# Patient Record
Sex: Female | Born: 1945 | Race: White | Hispanic: No | Marital: Single | Smoking: Former smoker
Health system: Southern US, Community
[De-identification: ages and names within clinical notes are randomized; demographics above are authoritative.]

## PROBLEM LIST (undated history)

## (undated) DIAGNOSIS — R21 Rash and other nonspecific skin eruption: Secondary | ICD-10-CM

## (undated) DIAGNOSIS — M4316 Spondylolisthesis, lumbar region: Secondary | ICD-10-CM

## (undated) DIAGNOSIS — G8929 Other chronic pain: Secondary | ICD-10-CM

## (undated) DIAGNOSIS — G2581 Restless legs syndrome: Secondary | ICD-10-CM

## (undated) DIAGNOSIS — R51 Headache: Secondary | ICD-10-CM

## (undated) DIAGNOSIS — Z8659 Personal history of other mental and behavioral disorders: Secondary | ICD-10-CM

## (undated) DIAGNOSIS — T4145XA Adverse effect of unspecified anesthetic, initial encounter: Secondary | ICD-10-CM

## (undated) DIAGNOSIS — G629 Polyneuropathy, unspecified: Secondary | ICD-10-CM

## (undated) DIAGNOSIS — M545 Low back pain, unspecified: Secondary | ICD-10-CM

## (undated) DIAGNOSIS — T8859XA Other complications of anesthesia, initial encounter: Secondary | ICD-10-CM

## (undated) DIAGNOSIS — R350 Frequency of micturition: Secondary | ICD-10-CM

## (undated) DIAGNOSIS — H353210 Exudative age-related macular degeneration, right eye, stage unspecified: Secondary | ICD-10-CM

## (undated) DIAGNOSIS — M542 Cervicalgia: Secondary | ICD-10-CM

## (undated) DIAGNOSIS — R3915 Urgency of urination: Secondary | ICD-10-CM

## (undated) DIAGNOSIS — E785 Hyperlipidemia, unspecified: Secondary | ICD-10-CM

## (undated) DIAGNOSIS — Z860100 Personal history of colon polyps, unspecified: Secondary | ICD-10-CM

## (undated) DIAGNOSIS — K5909 Other constipation: Secondary | ICD-10-CM

## (undated) DIAGNOSIS — E890 Postprocedural hypothyroidism: Secondary | ICD-10-CM

## (undated) DIAGNOSIS — D509 Iron deficiency anemia, unspecified: Secondary | ICD-10-CM

## (undated) DIAGNOSIS — F419 Anxiety disorder, unspecified: Secondary | ICD-10-CM

## (undated) DIAGNOSIS — I10 Essential (primary) hypertension: Secondary | ICD-10-CM

## (undated) DIAGNOSIS — E119 Type 2 diabetes mellitus without complications: Secondary | ICD-10-CM

## (undated) DIAGNOSIS — R0600 Dyspnea, unspecified: Secondary | ICD-10-CM

## (undated) DIAGNOSIS — F32A Depression, unspecified: Secondary | ICD-10-CM

## (undated) DIAGNOSIS — M1990 Unspecified osteoarthritis, unspecified site: Secondary | ICD-10-CM

## (undated) DIAGNOSIS — R202 Paresthesia of skin: Secondary | ICD-10-CM

## (undated) DIAGNOSIS — G4733 Obstructive sleep apnea (adult) (pediatric): Secondary | ICD-10-CM

## (undated) DIAGNOSIS — F329 Major depressive disorder, single episode, unspecified: Secondary | ICD-10-CM

## (undated) DIAGNOSIS — G4700 Insomnia, unspecified: Secondary | ICD-10-CM

## (undated) DIAGNOSIS — K429 Umbilical hernia without obstruction or gangrene: Secondary | ICD-10-CM

## (undated) DIAGNOSIS — Z8639 Personal history of other endocrine, nutritional and metabolic disease: Secondary | ICD-10-CM

## (undated) DIAGNOSIS — Z85828 Personal history of other malignant neoplasm of skin: Secondary | ICD-10-CM

## (undated) DIAGNOSIS — Z9889 Other specified postprocedural states: Secondary | ICD-10-CM

## (undated) DIAGNOSIS — K219 Gastro-esophageal reflux disease without esophagitis: Secondary | ICD-10-CM

## (undated) DIAGNOSIS — R519 Headache, unspecified: Secondary | ICD-10-CM

## (undated) DIAGNOSIS — R0609 Other forms of dyspnea: Secondary | ICD-10-CM

## (undated) HISTORY — DX: Gastro-esophageal reflux disease without esophagitis: K21.9

## (undated) HISTORY — DX: Obstructive sleep apnea (adult) (pediatric): G47.33

## (undated) HISTORY — DX: Spondylolisthesis, lumbar region: M43.16

---

## 1989-02-22 HISTORY — PX: TUBAL LIGATION: SHX77

## 2003-09-19 ENCOUNTER — Other Ambulatory Visit: Admission: RE | Admit: 2003-09-19 | Discharge: 2003-09-19 | Payer: Self-pay | Admitting: Obstetrics and Gynecology

## 2004-09-24 ENCOUNTER — Other Ambulatory Visit: Admission: RE | Admit: 2004-09-24 | Discharge: 2004-09-24 | Payer: Self-pay | Admitting: Obstetrics and Gynecology

## 2005-02-22 HISTORY — PX: ESOPHAGOGASTRODUODENOSCOPY: SHX1529

## 2005-02-22 HISTORY — PX: COLONOSCOPY: SHX174

## 2005-08-17 ENCOUNTER — Encounter: Admission: RE | Admit: 2005-08-17 | Discharge: 2005-08-17 | Payer: Self-pay | Admitting: Family Medicine

## 2005-12-24 ENCOUNTER — Other Ambulatory Visit: Admission: RE | Admit: 2005-12-24 | Discharge: 2005-12-24 | Payer: Self-pay | Admitting: Obstetrics and Gynecology

## 2006-12-27 ENCOUNTER — Other Ambulatory Visit: Admission: RE | Admit: 2006-12-27 | Discharge: 2006-12-27 | Payer: Self-pay | Admitting: Obstetrics and Gynecology

## 2008-03-04 ENCOUNTER — Other Ambulatory Visit: Admission: RE | Admit: 2008-03-04 | Discharge: 2008-03-04 | Payer: Self-pay | Admitting: Obstetrics and Gynecology

## 2008-03-07 ENCOUNTER — Encounter: Admission: RE | Admit: 2008-03-07 | Discharge: 2008-03-07 | Payer: Self-pay | Admitting: Obstetrics and Gynecology

## 2009-02-22 DIAGNOSIS — Z8639 Personal history of other endocrine, nutritional and metabolic disease: Secondary | ICD-10-CM

## 2009-02-22 HISTORY — DX: Personal history of other endocrine, nutritional and metabolic disease: Z86.39

## 2009-06-16 ENCOUNTER — Encounter (HOSPITAL_COMMUNITY): Admission: RE | Admit: 2009-06-16 | Discharge: 2009-08-26 | Payer: Self-pay | Admitting: Internal Medicine

## 2009-07-01 ENCOUNTER — Other Ambulatory Visit: Admission: RE | Admit: 2009-07-01 | Discharge: 2009-07-01 | Payer: Self-pay | Admitting: Obstetrics and Gynecology

## 2009-09-18 ENCOUNTER — Ambulatory Visit: Payer: Self-pay | Admitting: Internal Medicine

## 2009-09-18 DIAGNOSIS — E039 Hypothyroidism, unspecified: Secondary | ICD-10-CM | POA: Insufficient documentation

## 2009-09-18 DIAGNOSIS — I10 Essential (primary) hypertension: Secondary | ICD-10-CM | POA: Insufficient documentation

## 2009-09-18 DIAGNOSIS — F32A Depression, unspecified: Secondary | ICD-10-CM | POA: Insufficient documentation

## 2009-09-18 DIAGNOSIS — D649 Anemia, unspecified: Secondary | ICD-10-CM | POA: Insufficient documentation

## 2009-09-18 DIAGNOSIS — K219 Gastro-esophageal reflux disease without esophagitis: Secondary | ICD-10-CM

## 2009-09-18 DIAGNOSIS — M1712 Unilateral primary osteoarthritis, left knee: Secondary | ICD-10-CM | POA: Insufficient documentation

## 2009-09-18 DIAGNOSIS — F329 Major depressive disorder, single episode, unspecified: Secondary | ICD-10-CM | POA: Insufficient documentation

## 2009-09-18 DIAGNOSIS — E785 Hyperlipidemia, unspecified: Secondary | ICD-10-CM | POA: Insufficient documentation

## 2009-09-18 DIAGNOSIS — E119 Type 2 diabetes mellitus without complications: Secondary | ICD-10-CM | POA: Insufficient documentation

## 2009-09-18 HISTORY — DX: Gastro-esophageal reflux disease without esophagitis: K21.9

## 2009-09-19 LAB — CONVERTED CEMR LAB
Hgb A1c MFr Bld: 7.5 % — ABNORMAL HIGH (ref 4.6–6.5)
TSH: 0.26 microintl units/mL — ABNORMAL LOW (ref 0.35–5.50)

## 2009-09-26 ENCOUNTER — Telehealth: Payer: Self-pay | Admitting: Internal Medicine

## 2009-10-09 ENCOUNTER — Encounter: Payer: Self-pay | Admitting: Internal Medicine

## 2009-10-09 ENCOUNTER — Telehealth: Payer: Self-pay

## 2009-10-23 ENCOUNTER — Ambulatory Visit: Payer: Self-pay | Admitting: Internal Medicine

## 2009-10-23 DIAGNOSIS — J069 Acute upper respiratory infection, unspecified: Secondary | ICD-10-CM | POA: Insufficient documentation

## 2009-10-23 LAB — CONVERTED CEMR LAB: Blood Glucose, Fingerstick: 120

## 2009-11-20 ENCOUNTER — Ambulatory Visit: Payer: Self-pay | Admitting: Internal Medicine

## 2009-11-28 ENCOUNTER — Telehealth: Payer: Self-pay | Admitting: Internal Medicine

## 2010-01-26 ENCOUNTER — Ambulatory Visit: Payer: Self-pay | Admitting: Internal Medicine

## 2010-01-29 LAB — CONVERTED CEMR LAB: TSH: 8.19 microintl units/mL — ABNORMAL HIGH (ref 0.35–5.50)

## 2010-03-11 ENCOUNTER — Encounter: Payer: Self-pay | Admitting: Internal Medicine

## 2010-03-14 ENCOUNTER — Encounter: Payer: Self-pay | Admitting: Obstetrics and Gynecology

## 2010-03-15 ENCOUNTER — Encounter: Payer: Self-pay | Admitting: Internal Medicine

## 2010-03-23 ENCOUNTER — Telehealth: Payer: Self-pay

## 2010-03-23 ENCOUNTER — Encounter: Payer: Self-pay | Admitting: Internal Medicine

## 2010-03-26 NOTE — Medication Information (Signed)
Summary: Prior Authorization Request for Lexapro  Prior Authorization Request for Lexapro   Imported By: Maryln Gottron 10/14/2009 14:26:16  _____________________________________________________________________  External Attachment:    Type:   Image     Comment:   External Document

## 2010-03-26 NOTE — Progress Notes (Signed)
Summary: Medication List and Blood Sugars taken at home  Medication List and Blood Sugars taken at home   Imported By: Maryln Gottron 09/23/2009 10:12:58  _____________________________________________________________________  External Attachment:    Type:   Image     Comment:   External Document

## 2010-03-26 NOTE — Progress Notes (Signed)
Summary: refill amaryl - new pharmacy  Phone Note Call from Patient Call back at Home Phone [phone]   Caller: Patient Call For: Katie Savers  MD Summary of Call: PT NEEDS NEW RX CALL TO NEW [facility] [address] GLIMEPRIDE 4MG  Initial call taken by: Heron Sabins,  November 28, 2009 12:53 PM    Prescriptions: AMARYL 4 MG TABS (GLIMEPIRIDE) 1/2 qam  #90 x 2   Entered by:   Duard Brady LPN   Authorized by:   Katie Savers  MD   Signed by:   Duard Brady LPN on 91/47/8295   Method used:   Electronically to        [id number]       [address]       [place], [place]  [address]       Ph: [phone] or [phone]       Fax: [phone]   RxID:   [id number]  sent to [facility]

## 2010-03-26 NOTE — Progress Notes (Signed)
Summary:  script for generic Anaprox.   Phone Note Call from Patient Call back at [facility] Phone [phone]   Caller: Patient Summary of Call: Pt called and said that she did not rcv a script for Anaprox DS 550mg  tabs (Naproxen Sodium)1 two times a day. Please call in the Generic [facility]  [facility] [address] [phone] Initial call taken by: Lucy Antigua,  September 26, 2009 10:58 AM  Follow-up for Phone Call        #50  RF 4 Follow-up by: Gordy Savers  MD,  September 26, 2009 12:56 PM    Prescriptions: Margart Sickles DS 550 MG TABS (NAPROXEN SODIUM) bid  #50 x 4   Entered by:   Duard Brady LPN   Authorized by:   Gordy Savers  MD   Signed by:   Duard Brady LPN on 70/62/3762   Method used:   Electronically to        [facility] [address] [id number]       [address]       [place], [place]  [address]       Ph: [phone] or [phone]       Fax: [phone]   RxID:   [id number]

## 2010-03-26 NOTE — Letter (Signed)
Summary: Diabetic Eye Exam/[facility]  Diabetic Eye Exam/[facility]   Imported By: Maryln Gottron 03/19/2010 12:16:55  _____________________________________________________________________  External Attachment:    Type:   Image     Comment:   External Document

## 2010-03-26 NOTE — Assessment & Plan Note (Signed)
Summary: 2 month fup//ccm   Vital Signs:  Patient profile:   65 year old female Weight:      205 pounds Temp:     98.1 degrees F oral BP sitting:   112 / 70  (left arm) Cuff size:   regular  Vitals Entered By: Duard Brady LPN (November 20, 2009 3:15 PM) CC: 2 mos rov - doing well     [id number] Is Patient Diabetic? Yes Did you bring your meter with you today? No Flu Vaccine Consent Questions     Do you have a history of severe allergic reactions to this vaccine? no    Any prior history of allergic reactions to egg and/or gelatin? no    Do you have a sensitivity to the preservative Thimersol? no    Do you have a past history of Guillan-Barre Syndrome? no    Do you currently have an acute febrile illness? no    Have you ever had a severe reaction to latex? no    Vaccine information given and explained to patient? yes    Are you currently pregnant? no    Lot Number:[id number]   Exp Date:08/22/2010   Site Given  Left Deltoid IM   CC:  2 mos rov - doing well     fbs156.  History of Present Illness: a 65 year old patient who is seen today for follow-up of her type 2 diabetes.  She is 8.  Some occasional midafternoon.  Mild hypoglycemic episodes, which have been associated with prolonged periods of fasting.  Blood sugars in the morning are often slightly elevated that she usually has a late heavy meal.  Her last hemoglobin A1c7.5.  She is now on post I-131 thyroid replacement medication.  She generally feels well.  She has treated hypertension and dyslipidemia.  She has a history depression and has been on lyric a 75 mg b.i.d.  She feels this medication is not well tolerated and wishes to give herself a trial off  Allergies: 1)  ! * Flonase 2)  ! Codeine  Past History:  Past Medical History: Reviewed history from 09/18/2009 and no changes required. Depression Diabetes mellitus, type II GERD Hypertension Anemia-NOS Hyperlipidemia Hypothyroidism Osteoarthritis exogenous  obesity  Review of Systems       The patient complains of depression.  The patient denies anorexia, fever, weight loss, weight gain, vision loss, decreased hearing, hoarseness, chest pain, syncope, dyspnea on exertion, peripheral edema, prolonged cough, headaches, hemoptysis, abdominal pain, melena, hematochezia, severe indigestion/heartburn, hematuria, incontinence, genital sores, muscle weakness, suspicious skin lesions, transient blindness, difficulty walking, unusual weight change, abnormal bleeding, enlarged lymph nodes, angioedema, and breast masses.    Physical Exam  General:  overweight-appearing.  normal blood pressureoverweight-appearing.   Head:  Normocephalic and atraumatic without obvious abnormalities. No apparent alopecia or balding. Mouth:  Oral mucosa and oropharynx without lesions or exudates.  Teeth in good repair. Neck:  No deformities, masses, or tenderness noted. Lungs:  Normal respiratory effort, chest expands symmetrically. Lungs are clear to auscultation, no crackles or wheezes. Heart:  Normal rate and regular rhythm. S1 and S2 normal without gallop, murmur, click, rub or other extra sounds. Abdomen:  Bowel sounds positive,abdomen soft and non-tender without masses, organomegaly or hernias noted.   Impression & Recommendations:  Problem # 1:  HYPOTHYROIDISM (ICD-244.9)  Her updated medication list for this problem includes:    Levothroid 100 Mcg Tabs (Levothyroxine sodium) ..... Qam  Her updated medication list for this problem includes:    Levothroid  100 Mcg Tabs (Levothyroxine sodium) ..... Qam  Problem # 2:  HYPERTENSION (ICD-401.9)  Her updated medication list for this problem includes:    Tenoretic 50 50-25 Mg Tabs (Atenolol-chlorthalidone) ..... Qam    qam    Furosemide 20 Mg Tabs (Furosemide) ..... Qam  Her updated medication list for this problem includes:    Tenoretic 50 50-25 Mg Tabs (Atenolol-chlorthalidone) ..... Qam    qam    Furosemide 20 Mg  Tabs (Furosemide) ..... Qam  Problem # 3:  DIABETES MELLITUS, TYPE II (ICD-250.00)  Her updated medication list for this problem includes:    Metformin Hcl 1000 Mg Tabs (Metformin hcl) ..... Bid    Amaryl 4 Mg Tabs (Glimepiride) .Marland Kitchen... 1/2 qam the patient will switch to smaller, more frequent meals in an attempt to avoid afternoon hypoglycemia.  Her updated medication list for this problem includes:    Metformin Hcl 1000 Mg Tabs (Metformin hcl) ..... Bid    Amaryl 4 Mg Tabs (Glimepiride) .Marland Kitchen... 1/2 qam  Problem # 4:  DEPRESSION (ICD-311)  Her updated medication list for this problem includes:    Alprazolam 0.5 Mg Tabs (Alprazolam) ..... Once daily as needed    Trazodone Hcl 100 Mg Tabs (Trazodone hcl) .Marland Kitchen... At bedtime    Lexapro 20 Mg Tabs (Escitalopram oxalate) ..... Qam will decrease lyric to 75 mg daily for two weeks and then discontinue  Her updated medication list for this problem includes:    Alprazolam 0.5 Mg Tabs (Alprazolam) ..... Once daily as needed    Trazodone Hcl 100 Mg Tabs (Trazodone hcl) .Marland Kitchen... At bedtime    Lexapro 20 Mg Tabs (Escitalopram oxalate) ..... Qam  Complete Medication List: 1)  Metformin Hcl 1000 Mg Tabs (Metformin hcl) .... Bid 2)  Amaryl 4 Mg Tabs (Glimepiride) .... 1/2 qam 3)  Lyrica 75 Mg Caps (Pregabalin) .... Bid 4)  Anaprox Ds 550 Mg Tabs (Naproxen sodium) .... Bid 5)  Tylenol Arthritis Pain 650 Mg Cr-tabs (Acetaminophen) .... Q8hrs 6)  Alprazolam 0.5 Mg Tabs (Alprazolam) .... Once daily as needed 7)  Ambien 10 Mg Tabs (Zolpidem tartrate) .... Qhs 8)  Trazodone Hcl 100 Mg Tabs (Trazodone hcl) .... At bedtime 9)  Lexapro 20 Mg Tabs (Escitalopram oxalate) .... Qam 10)  Tenoretic 50 50-25 Mg Tabs (Atenolol-chlorthalidone) .... Qam    qam 11)  Furosemide 20 Mg Tabs (Furosemide) .... Qam 12)  Lopid 600 Mg Tabs (Gemfibrozil) .... Two times a day 13)  Calcium 600+d Plus Minerals 600-400 Mg-unit Tabs (Calcium carbonate-vit d-min) .... Two times a  day 14)  Omeprazole 20 Mg Tbec (omeprazole)  15)  Multivitamins Tabs (Multiple vitamin) .... Once daily 16)  Eye Vitamins Tabs (Multiple vitamins-minerals) .... Once daily 17)  Vagifem 10 Mcg Tabs (Estradiol) .Marland Kitchen.. 1 by mouth 2x weekly 18)  Onetouch Ultra Test Strp (Glucose blood) .... Once daily and prn 19)  Onetouch Ultra 2 W/device Kit (Blood glucose monitoring suppl) 20)  Onetouch Ultrasoft Lancets Misc (Lancets) .... Once daily and prn 21)  Levothroid 100 Mcg Tabs (Levothyroxine sodium) .... Qam 22)  [facility] Iron 325 (65 Fe) Mg Tabs (Ferrous sulfate) .... Qd 23)  Potassium Gluconate 595 Mg Tabs (Potassium gluconate) .... Qd 24)  Fluticasone Propionate 50 Mcg/act Susp (Fluticasone propionate) .... Used twice daily  Other Orders: Admin 1st Vaccine ([address]) Flu Vaccine 34yrs + ([address])  Patient Instructions: 1)  Please schedule a follow-up appointment in 2 months. 2)  Limit your Sodium (Salt). 3)  It is important that you exercise  regularly at least 20 minutes 5 times a week. If you develop chest pain, have severe difficulty breathing, or feel very tired , stop exercising immediately and seek medical attention. 4)  You need to lose weight. Consider a lower calorie diet and regular exercise.  5)  Check your blood sugars regularly. If your readings are usually above : or below 70 you should contact our office. 6)  It is important that your Diabetic A1c level is checked every 3 months.

## 2010-03-26 NOTE — Assessment & Plan Note (Signed)
Summary: fatigue/swollen eyelids/njr   Vital Signs:  Patient profile:   65 year old female Weight:      210 pounds Temp:     97.8 degrees F oral BP sitting:   108 / 72  (left arm) Cuff size:   regular  Vitals Entered By: Duard Brady LPN (October 23, 2009 11:13 AM) CC: c/o eyelids swelling and tearing with discharge , fatigue      fbs 113? Is Patient Diabetic? Yes Did you bring your meter with you today? No CBG Result 120   CC:  c/o eyelids swelling and tearing with discharge  and fatigue      fbs 113?Marland Kitchen  History of Present Illness: 65 year old patient seen today for follow-up.  Since her last visit here, she has been evaluating by endocrinology and now is on supplemental  levothyroxin following I-131 in May of this year.  For the past 8 days.  She has had some hoarseness, cough, itching and erythema of the eyes with some upper lid puffiness.  No fever or sputum production.  Denies any wheezing or shortness of breath she does have a history of type 2 diabetes, obesity, and poor eating habits.  Her last hemoglobin A1c was 7.5.  This was discussed  Allergies: 1)  ! * Flonase 2)  ! Codeine  Review of Systems       The patient complains of hoarseness and prolonged cough.  The patient denies fever, weight loss, weight gain, vision loss, decreased hearing, chest pain, syncope, dyspnea on exertion, peripheral edema, headaches, hemoptysis, abdominal pain, melena, hematochezia, severe indigestion/heartburn, hematuria, incontinence, genital sores, muscle weakness, suspicious skin lesions, transient blindness, difficulty walking, depression, unusual weight change, abnormal bleeding, enlarged lymph nodes, angioedema, and breast masses.    Physical Exam  General:  overweight-appearing.  no distress.  Low-normal blood pressureoverweight-appearing.   Head:  Normocephalic and atraumatic without obvious abnormalities. No apparent alopecia or balding. Eyes:  No corneal or conjunctival  inflammation noted. EOMI. Perrla. Funduscopic exam benign, without hemorrhages, exudates or papilledema. Vision grossly normal. Ears:  External ear exam shows no significant lesions or deformities.  Otoscopic examination reveals clear canals, tympanic membranes are intact bilaterally without bulging, retraction, inflammation or discharge. Hearing is grossly normal bilaterally. Mouth:  Oral mucosa and oropharynx without lesions or exudates.  Teeth in good repair. Neck:  No deformities, masses, or tenderness noted. Lungs:  Normal respiratory effort, chest expands symmetrically. Lungs are clear to auscultation, no crackles or wheezes. Heart:  Normal rate and regular rhythm. S1 and S2 normal without gallop, murmur, click, rub or other extra sounds. Abdomen:  Bowel sounds positive,abdomen soft and non-tender without masses, organomegaly or hernias noted.   Impression & Recommendations:  Problem # 1:  HYPOTHYROIDISM (ICD-244.9)  Her updated medication list for this problem includes:    Levothroid 100 Mcg Tabs (Levothyroxine sodium) ..... Qam  Her updated medication list for this problem includes:    Levothroid 100 Mcg Tabs (Levothyroxine sodium) ..... Qam  Problem # 2:  HYPERTENSION (ICD-401.9)  Her updated medication list for this problem includes:    Tenoretic 50 50-25 Mg Tabs (Atenolol-chlorthalidone) ..... Qam    qam    Furosemide 20 Mg Tabs (Furosemide) ..... Qam  Her updated medication list for this problem includes:    Tenoretic 50 50-25 Mg Tabs (Atenolol-chlorthalidone) ..... Qam    qam    Furosemide 20 Mg Tabs (Furosemide) ..... Qam  Problem # 3:  DIABETES MELLITUS, TYPE II (ICD-250.00)  Her updated medication  list for this problem includes:    Metformin Hcl 1000 Mg Tabs (Metformin hcl) ..... Bid    Amaryl 4 Mg Tabs (Glimepiride) .Marland Kitchen... 1/2 qam  Orders: Capillary Blood Glucose/CBG ([address])  Her updated medication list for this problem includes:    Metformin Hcl 1000 Mg Tabs  (Metformin hcl) ..... Bid    Amaryl 4 Mg Tabs (Glimepiride) .Marland Kitchen... 1/2 qam  Problem # 4:  URI (ICD-465.9)  Her updated medication list for this problem includes:    Anaprox Ds 550 Mg Tabs (Naproxen sodium) ..... Bid    Tylenol Arthritis Pain 650 Mg Cr-tabs (Acetaminophen) ..... Q8hrs samples of Clarinex, dispensed  Her updated medication list for this problem includes:    Anaprox Ds 550 Mg Tabs (Naproxen sodium) ..... Bid    Tylenol Arthritis Pain 650 Mg Cr-tabs (Acetaminophen) ..... Q8hrs  Complete Medication List: 1)  Metformin Hcl 1000 Mg Tabs (Metformin hcl) .... Bid 2)  Amaryl 4 Mg Tabs (Glimepiride) .... 1/2 qam 3)  Lyrica 75 Mg Caps (Pregabalin) .... Bid 4)  Anaprox Ds 550 Mg Tabs (Naproxen sodium) .... Bid 5)  Tylenol Arthritis Pain 650 Mg Cr-tabs (Acetaminophen) .... Q8hrs 6)  Alprazolam 0.5 Mg Tabs (Alprazolam) .... Once daily as needed 7)  Ambien 10 Mg Tabs (Zolpidem tartrate) .... Qhs 8)  Trazodone Hcl 100 Mg Tabs (Trazodone hcl) .... At bedtime 9)  Lexapro 20 Mg Tabs (Escitalopram oxalate) .... Qam 10)  Tenoretic 50 50-25 Mg Tabs (Atenolol-chlorthalidone) .... Qam    qam 11)  Furosemide 20 Mg Tabs (Furosemide) .... Qam 12)  Lopid 600 Mg Tabs (Gemfibrozil) .... Two times a day 13)  Calcium 600+d Plus Minerals 600-400 Mg-unit Tabs (Calcium carbonate-vit d-min) .... Two times a day 14)  Omeprazole 20 Mg Tbec (omeprazole)  15)  Multivitamins Tabs (Multiple vitamin) .... Once daily 16)  Eye Vitamins Tabs (Multiple vitamins-minerals) .... Once daily 17)  Vagifem 10 Mcg Tabs (Estradiol) .Marland Kitchen.. 1 by mouth 2x weekly 18)  Onetouch Ultra Test Strp (Glucose blood) .... Once daily and prn 19)  Onetouch Ultra 2 W/device Kit (Blood glucose monitoring suppl) 20)  Onetouch Ultrasoft Lancets Misc (Lancets) .... Once daily and prn 21)  Levothroid 100 Mcg Tabs (Levothyroxine sodium) .... Qam 22)  [facility] Iron 325 (65 Fe) Mg Tabs (Ferrous sulfate) .... Qd 23)  Potassium Gluconate 595 Mg Tabs  (Potassium gluconate) .... Qd 24)  Fluticasone Propionate 50 Mcg/act Susp (Fluticasone propionate) .... Used twice daily  Patient Instructions: 1)  Get plenty of rest, drink lots of clear liquids, and use Tylenol or Ibuprofen for fever and comfort. Return in 7-10 days if you're not better:sooner if you're feeling worse. 2)  CLARINEX ONE DAILY  Prescriptions: FLUTICASONE PROPIONATE 50 MCG/ACT SUSP (FLUTICASONE PROPIONATE) used twice daily  #1 x 4   Entered and Authorized by:   Gordy Savers  MD   Signed by:   Gordy Savers  MD on 10/23/2009   Method used:   [facility] to        [facility]       [address]       [place], [place]  [address]       Ph: [phone] or [phone]       Fax: [phone]   RxID:   [id number]

## 2010-03-26 NOTE — Assessment & Plan Note (Signed)
Summary: new to est-fup on meds//ccm   Vital Signs:  Patient profile:   65 year old female Height:      62 inches Weight:      198 pounds BMI:     36.35 Temp:     98.2 degrees F oral Pulse rate:   60 / minute Pulse rhythm:   regular Resp:     16 per minute BP sitting:   100 / 70  (right arm) Cuff size:   regular  Vitals Entered By: Duard Brady LPN (September 18, 2009 2:50 PM) CC: new to establish - med review     fbs 148 Is Patient Diabetic? Yes Did you bring your meter with you today? No   CC:  new to establish - med review     fbs 148.  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  Her last hemoglobin  A1c was in January and was 6.9.    Allergies (verified): 1)  ! * Flonase 2)  ! Codeine  Past History:  Past Medical History: Depression Diabetes mellitus, type II GERD Hypertension Anemia-NOS Hyperlipidemia Hypothyroidism Osteoarthritis exogenous obesity  Past Surgical History: colonoscopy 2007 status post I-131 May 2011  Family History: Reviewed history and no changes required. father died age 76, complications of diabetes, and senile dementia mother, age 30, with osteoporosis, dementia, heart failure one brother, age 60, with obesity, and hypertension one sister died age 50  MI  positive for colon cancer grandparent uncles with lung cancer  Social History: Reviewed history and no changes required. 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   discontinued tobacco 2007  Review of Systems       The patient complains of depression.  The patient denies anorexia, fever, weight loss, weight gain, vision loss, decreased hearing, hoarseness, chest pain, syncope, dyspnea on exertion, peripheral edema, prolonged cough, headaches, hemoptysis,  abdominal pain, melena, hematochezia, severe indigestion/heartburn, hematuria, incontinence, genital sores, muscle weakness, suspicious skin lesions, transient blindness, difficulty walking, unusual weight change, abnormal bleeding, enlarged lymph nodes, angioedema, and breast masses.    Physical Exam  General:  overweight-appearing.   Head:  Normocephalic and atraumatic without obvious abnormalities. No apparent alopecia or balding. Eyes:  No corneal or conjunctival inflammation noted. EOMI. Perrla. Funduscopic exam benign, without hemorrhages, exudates or papilledema. Vision grossly normal. Ears:  External ear exam shows no significant lesions or deformities.  Otoscopic examination reveals clear canals, tympanic membranes are intact bilaterally without bulging, retraction, inflammation or discharge. Hearing is grossly normal bilaterally. Mouth:  Oral mucosa and oropharynx without lesions or exudates.  Teeth in good repair. Neck:  No deformities, masses, or tenderness noted. Chest Wall:  No deformities, masses, or tenderness noted. Lungs:  Normal respiratory effort, chest expands symmetrically. Lungs are clear to auscultation, no crackles or wheezes. Heart:  Normal rate and regular rhythm. S1 and S2 normal without gallop, murmur, click, rub or other extra sounds. Abdomen:  Bowel sounds positive,abdomen soft and non-tender without masses, organomegaly or hernias noted. Msk:  No deformity or scoliosis noted of thoracic or lumbar spine.   Pulses:  R and L carotid,radial,femoral,dorsalis pedis and posterior tibial pulses are full and equal bilaterally Extremities:  No clubbing, cyanosis, edema, or deformity noted with normal full range of motion of all joints.   Neurologic:  alert & oriented X3, cranial nerves II-XII intact, gait normal, DTRs  symmetrical and normal, and finger-to-nose normal.   Skin:  Intact without suspicious lesions or rashes Cervical Nodes:  No lymphadenopathy noted Axillary  Nodes:  No palpable lymphadenopathy Inguinal Nodes:  No significant adenopathy Psych:  Cognition and judgment appear intact. Alert and cooperative with normal attention span and concentration. No apparent delusions, illusions, hallucinations  Diabetes Management Exam:    Foot Exam (with socks and/or shoes not present):       Sensory-Pinprick/Light touch:          Left medial foot (L-4): normal          Left dorsal foot (L-5): normal          Left lateral foot (S-1): normal          Right medial foot (L-4): normal          Right dorsal foot (L-5): normal          Right lateral foot (S-1): normal       Sensory-Monofilament:          Left foot: normal          Right foot: normal       Inspection:          Left foot: normal          Right foot: normal       Nails:          Left foot: thickened          Right foot: thickened    Foot Exam by Podiatrist:       Date: 09/18/2009       Results: moderate diabetic findings       Done by: BCT    Eye Exam:       Eye Exam done here today          Results: normal   Impression & Recommendations:  Problem # 1:  PREVENTIVE HEALTH CARE (ICD-V70.0)  Complete Medication List: 1)  Metformin Hcl 1000 Mg Tabs (Metformin hcl) .... Bid 2)  Amaryl 4 Mg Tabs (Glimepiride) .... 1/2 qam 3)  Lyrica 75 Mg Caps (Pregabalin) .... Bid 4)  Anaprox Ds 550 Mg Tabs (Naproxen sodium) .... Bid 5)  Tylenol Arthritis Pain 650 Mg Cr-tabs (Acetaminophen) .... Q8hrs 6)  Alprazolam 0.5 Mg Tabs (Alprazolam) .... Once daily as needed 7)  Ambien 10 Mg Tabs (Zolpidem tartrate) .... Qhs 8)  Trazodone Hcl 100 Mg Tabs (Trazodone hcl) .... At bedtime 9)  Lexapro 20 Mg Tabs (Escitalopram oxalate) .... Qam 10)  Tenoretic 50 50-25 Mg Tabs (Atenolol-chlorthalidone) .... Qam    qam 11)  Furosemide 20 Mg Tabs (Furosemide) .... Qam 12)  Lopid 600 Mg Tabs (Gemfibrozil) .... Two times a day 13)  Calcium 600+d Plus Minerals 600-400 Mg-unit Tabs (Calcium carbonate-vit d-min) .... Two  times a day 14)  Omeprazole 20 Mg Tbec (omeprazole)  15)  Multivitamins Tabs (Multiple vitamin) .... Once daily 16)  Eye Vitamins Tabs (Multiple vitamins-minerals) .... Once daily 17)  Iferex 150 150 Mg Caps (Polysaccharide iron complex) .... Qd 18)  Vagifem 10 Mcg Tabs (Estradiol) .Marland Kitchen.. 1 by mouth 2x weekly 19)  Onetouch Ultra Test Strp (Glucose blood) .... Once daily and prn 20)  Onetouch Ultra 2 W/device Kit (Blood glucose monitoring suppl) 21)  Onetouch Ultrasoft Lancets Misc (Lancets) .... Once daily and prn  Other Orders: Venipuncture (10272) TLB-TSH (Thyroid Stimulating Hormone) (84443-TSH) TLB-A1C / Hgb A1C (Glycohemoglobin) (83036-A1C)  Patient Instructions: 1)  Please schedule a follow-up appointment in 2  months. 2)  Limit your Sodium (Salt). 3)  It is important that you exercise regularly at least 20 minutes 5 times a week. If you develop chest pain, have severe difficulty breathing, or feel very tired , stop exercising immediately and seek medical attention. 4)  You need to lose weight. Consider a lower calorie diet and regular exercise.  5)  Take calcium +Vitamin D daily. 6)  Check your blood sugars regularly. If your readings are usually above : or below 70 you should contact our office. 7)  It is important that your Diabetic A1c level is checked every 3 months. 8)  See your eye doctor yearly to check for diabetic eye damage. Prescriptions: ONETOUCH ULTRASOFT LANCETS  MISC (LANCETS) once daily and prn  #90 x 6   Entered and Authorized by:   Gordy Savers  MD   Signed by:   Gordy Savers  MD on 09/18/2009   Method used:   Print then Give to Patient   RxID:   [id number] Letta Pate ULTRA TEST  STRP (GLUCOSE BLOOD) once daily and prn  #90 x 6   Entered and Authorized by:   Gordy Savers  MD   Signed by:   Gordy Savers  MD on 09/18/2009   Method used:   Print then Give to Patient   RxID:   [id number] OMEPRAZOLE 20 MG TBEC (OMEPRAZOLE)    #90 x 6   Entered and Authorized by:   Gordy Savers  MD   Signed by:   Gordy Savers  MD on 09/18/2009   Method used:   Print then Give to Patient   RxID:   [id number] LOPID 600 MG TABS (GEMFIBROZIL) two times a day  #180 x 6   Entered and Authorized by:   Gordy Savers  MD   Signed by:   Gordy Savers  MD on 09/18/2009   Method used:   Print then Give to Patient   RxID:   [id number] FUROSEMIDE 20 MG TABS (FUROSEMIDE) qam  #90 x 6   Entered and Authorized by:   Gordy Savers  MD   Signed by:   Gordy Savers  MD on 09/18/2009   Method used:   Print then Give to Patient   RxID:   [id number] TENORETIC 50 50-25 MG TABS (ATENOLOL-CHLORTHALIDONE) qam    qam  #90 x 6   Entered and Authorized by:   Gordy Savers  MD   Signed by:   Gordy Savers  MD on 09/18/2009   Method used:   Print then Give to Patient   RxID:   [id number] LEXAPRO 20 MG TABS (ESCITALOPRAM OXALATE) qam  #90 x 3   Entered and Authorized by:   Gordy Savers  MD   Signed by:   Gordy Savers  MD on 09/18/2009   Method used:   Print then Give to Patient   RxID:   [id number] TRAZODONE HCL 100 MG TABS (TRAZODONE HCL) at bedtime  #90 x 6   Entered and Authorized by:   Gordy Savers  MD   Signed by:   Gordy Savers  MD on 09/18/2009   Method used:   Print then Give to Patient   RxID:   [id number] AMBIEN 10 MG TABS (ZOLPIDEM TARTRATE) qhs  #50 x 2   Entered and Authorized by:   Gordy Savers  MD   Signed by:   Gordy Savers  MD  on 09/18/2009   Method used:   Print then Give to Patient   RxID:   [id number] ALPRAZOLAM 0.5 MG TABS (ALPRAZOLAM) once daily as needed  #90 x 2   Entered and Authorized by:   Gordy Savers  MD   Signed by:   Gordy Savers  MD on 09/18/2009   Method used:   Print then Give to Patient   RxID:   [id number] LYRICA 75 MG CAPS (PREGABALIN) bid  #180 x 6   Entered  and Authorized by:   Gordy Savers  MD   Signed by:   Gordy Savers  MD on 09/18/2009   Method used:   Print then Give to Patient   RxID:   [id number] AMARYL 4 MG TABS (GLIMEPIRIDE) 1/2 qam  #90 x 6   Entered and Authorized by:   Gordy Savers  MD   Signed by:   Gordy Savers  MD on 09/18/2009   Method used:   Print then Give to Patient   RxID:   [id number] METFORMIN HCL 1000 MG TABS (METFORMIN HCL) bid  #180 x 6   Entered and Authorized by:   Gordy Savers  MD   Signed by:   Gordy Savers  MD on 09/18/2009   Method used:   Print then Give to Patient   RxID:   [id number]

## 2010-03-26 NOTE — Progress Notes (Signed)
Summary: lexapro hx  Phone Note Outgoing Call   Call placed by: Duard Brady LPN,  October 09, 2009 9:54 AM Call placed to: Patient Summary of Call: spoke with pt - trying to get pre auth on lexapro - , new to our practice 08/2009 - pt gives hx 2006 start.   2005 was on effexor and wellbutrin - failure of tx. KIK Initial call taken by: Duard Brady LPN,  October 09, 2009 9:57 AM

## 2010-03-26 NOTE — Assessment & Plan Note (Signed)
Summary: 2 MTH ROV // RS/pt rescd//ccm   Vital Signs:  Patient profile:   65 year old female Weight:      201 pounds Temp:     98.2 degrees F oral BP sitting:   122 / 88  (left arm) Cuff size:   regular  Vitals Entered By: Duard Brady LPN (January 26, 2010 2:05 PM) CC: 2 mos rov - doing ok  Is Patient Diabetic? Yes Did you bring your meter with you today? No CBG Result 74   CC:  2 mos rov - doing ok .  History of Present Illness:  65 year old patient who is seen today for follow-up of her type 2 diabetes.  She is maintained.  The muscularis into control.  No weight loss.  Her last hemoglobin A1c7.5 she has hypothyroidism, status post I-131 treatment in May of this year.  She is on thyroid replacement medication. She has a history of depression and is self-tapering lyric she wishes to substitute citalopram for Lexapro.  She has treated hypertension and dyslipidemia  Allergies: 1)  ! * Flonase 2)  ! Codeine  Past History:  Past Medical History: Depression Diabetes mellitus, type II GERD Hypertension Anemia-NOS Hyperlipidemia Hypothyroidism Osteoarthritis exogenous obesity OSA (failed CPAP)   Past Surgical History: colonoscopy 2007 status post I-131 May 2011 sleep studies x 3  Family History: Reviewed history from 09/18/2009 and no changes required. father died age 42, complications of diabetes, and senile dementia mother, age 67, with osteoporosis, dementia, heart failure one brother, age 45, with obesity, and hypertension one sister died age 11  MI  positive for colon cancer grandparent uncles with lung cancer  Review of Systems       The patient complains of suspicious skin lesions.  The patient denies anorexia, fever, weight loss, weight gain, vision loss, decreased hearing, hoarseness, chest pain, syncope, dyspnea on exertion, peripheral edema, prolonged cough, headaches, hemoptysis, abdominal pain, melena, hematochezia, severe  indigestion/heartburn, hematuria, incontinence, genital sores, muscle weakness, transient blindness, difficulty walking, depression, unusual weight change, abnormal bleeding, enlarged lymph nodes, angioedema, and breast masses.    Physical Exam  General:  overweight-appearing.  normal blood pressureoverweight-appearing.   Head:  Normocephalic and atraumatic without obvious abnormalities. No apparent alopecia or balding. Eyes:  conjunctiva injection Mouth:  pharyngeal crowding and low-lying uvula.   Neck:  No deformities, masses, or tenderness noted. Lungs:  Normal respiratory effort, chest expands symmetrically. Lungs are clear to auscultation, no crackles or wheezes. Heart:  Normal rate and regular rhythm. S1 and S2 normal without gallop, murmur, click, rub or other extra sounds. Abdomen:  Bowel sounds positive,abdomen soft and non-tender without masses, organomegaly or hernias noted. 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 Qam    qam    Furosemide 20 Mg Tabs (Furosemide) ..... Qam  Problem # 3:  DIABETES MELLITUS, TYPE II (ICD-250.00)  Her updated medication list for this problem includes:    Metformin Hcl 1000 Mg Tabs (  Metformin hcl) ..... Bid    Amaryl 4 Mg Tabs (Glimepiride) .Marland Kitchen... 1/2 qam  Orders: Capillary Blood Glucose/CBG (95621) Venipuncture (30865) TLB-A1C / Hgb A1C (Glycohemoglobin) (83036-A1C) Specimen Handling  (99000)  Complete Medication List: 1)  Metformin Hcl 1000 Mg Tabs (Metformin hcl) .... Bid 2)  Amaryl 4 Mg Tabs (Glimepiride) .... 1/2 qam 3)  Lyrica 75 Mg Caps (Pregabalin) .... Qd 4)  Anaprox Ds 550 Mg Tabs (Naproxen sodium) .... Bid 5)  Tylenol Arthritis Pain 650 Mg Cr-tabs (Acetaminophen) .... Q8hrs 6)  Alprazolam 0.5 Mg Tabs (Alprazolam) .... Once daily as needed 7)  Ambien 10 Mg Tabs (Zolpidem tartrate) .... Qhs 8)  Trazodone Hcl 100 Mg Tabs (Trazodone hcl) .... At bedtime 9)  Tenoretic 50 50-25 Mg Tabs (Atenolol-chlorthalidone) .... Qam    qam 10)  Furosemide 20 Mg Tabs (Furosemide) .... Qam 11)  Lopid 600 Mg Tabs (Gemfibrozil) .... Two times a day 12)  Calcium 600+d Plus Minerals 600-400 Mg-unit Tabs (Calcium carbonate-vit d-min) .... Two times a day 13)  Omeprazole 20 Mg Tbec (omeprazole)  14)  Multivitamins Tabs (Multiple vitamin) .... Once daily 15)  Eye Vitamins Tabs (Multiple vitamins-minerals) .... Once daily 16)  Onetouch Ultra Test Strp (Glucose blood) .... Once daily and prn 17)  Onetouch Ultra 2 W/device Kit (Blood glucose monitoring suppl) 18)  Onetouch Ultrasoft Lancets Misc (Lancets) .... Once daily and prn 19)  Levothroid 125 Mcg Tabs (levothyroxine Sodium)  .... Qam 20)  [facility] Iron 325 (65 Fe) Mg Tabs (Ferrous sulfate) .... Qd 21)  Potassium Gluconate 595 Mg Tabs (Potassium gluconate) .... Qd 22)  Fluticasone Propionate 50 Mcg/act Susp (Fluticasone propionate) .... Used twice daily 23)  Citalopram Hydrobromide 40 Mg Tabs (Citalopram hydrobromide) .... One daily  Other Orders: TLB-TSH (Thyroid Stimulating Hormone) [phone])  Patient Instructions: 1)  Please schedule a follow-up appointment in 3 months. 2)  It is important that you exercise regularly at least 20 minutes 5 times a week. If you develop chest pain, have severe difficulty breathing, or feel very tired , stop exercising immediately and seek medical attention. 3)  You need to lose weight. Consider a  lower calorie diet and regular exercise.  4)  Check your blood sugars regularly. If your readings are usually above : or below 70 you should contact our office. 5)  It is important that your Diabetic A1c level is checked every 3 months. 6)  See your eye doctor yearly to check for diabetic eye damage. Prescriptions: CITALOPRAM HYDROBROMIDE 40 MG TABS (CITALOPRAM HYDROBROMIDE) one daily  #90 x 6   Entered and Authorized by:   Gordy Savers  MD   Signed by:   Gordy Savers  MD on 01/26/2010   Method used:   Print then Give to Patient   RxID:   [id number] FLUTICASONE PROPIONATE 50 MCG/ACT SUSP (FLUTICASONE PROPIONATE) used twice daily  #1 x 4   Entered and Authorized by:   Gordy Savers  MD   Signed by:   Gordy Savers  MD on 01/26/2010   Method used:   Print then Give to Patient   RxID:   [id number] LEVOTHROID 125 MCG TABS (LEVOTHYROXINE SODIUM) qAM  #90 x 6   Entered and Authorized by:   Gordy Savers  MD   Signed by:   Gordy Savers  MD on 01/26/2010   Method used:   Print then Give to Patient   RxID:   [id number] ONETOUCH ULTRA TEST  STRP (GLUCOSE BLOOD) once  daily and prn  #90 x 6   Entered and Authorized by:   Gordy Savers  MD   Signed by:   Gordy Savers  MD on 01/26/2010   Method used:   Print then Give to Patient   RxID:   [id number] OMEPRAZOLE 20 MG TBEC (OMEPRAZOLE)   #90 x 6   Entered and Authorized by:   Gordy Savers  MD   Signed by:   Gordy Savers  MD on 01/26/2010   Method used:   Print then Give to Patient   RxID:   [id number] LOPID 600 MG TABS (GEMFIBROZIL) two times a day  #180 x 6   Entered and Authorized by:   Gordy Savers  MD   Signed by:   Gordy Savers  MD on 01/26/2010   Method used:   Print then Give to Patient   RxID:   [id number] FUROSEMIDE 20 MG TABS (FUROSEMIDE) qam  #90 x 6   Entered and Authorized by:   Gordy Savers  MD   Signed by:    Gordy Savers  MD on 01/26/2010   Method used:   Print then Give to Patient   RxID:   [id number] TENORETIC 50 50-25 MG TABS (ATENOLOL-CHLORTHALIDONE) qam    qam  #90 x 6   Entered and Authorized by:   Gordy Savers  MD   Signed by:   Gordy Savers  MD on 01/26/2010   Method used:   Print then Give to Patient   RxID:   [id number] TRAZODONE HCL 100 MG TABS (TRAZODONE HCL) at bedtime  #90 x 6   Entered and Authorized by:   Gordy Savers  MD   Signed by:   Gordy Savers  MD on 01/26/2010   Method used:   Print then Give to Patient   RxID:   [id number] AMBIEN 10 MG TABS (ZOLPIDEM TARTRATE) qhs  #50 x 2   Entered and Authorized by:   Gordy Savers  MD   Signed by:   Gordy Savers  MD on 01/26/2010   Method used:   Print then Give to Patient   RxID:   [id number] ALPRAZOLAM 0.5 MG TABS (ALPRAZOLAM) once daily as needed  #90 x 2   Entered and Authorized by:   Gordy Savers  MD   Signed by:   Gordy Savers  MD on 01/26/2010   Method used:   Print then Give to Patient   RxID:   [id number] LYRICA 75 MG CAPS (PREGABALIN) qd  #90 x 3   Entered and Authorized by:   Gordy Savers  MD   Signed by:   Gordy Savers  MD on 01/26/2010   Method used:   Print then Give to Patient   RxID:   [id number] AMARYL 4 MG TABS (GLIMEPIRIDE) 1/2 qam  #90 x 2   Entered and Authorized by:   Gordy Savers  MD   Signed by:   Gordy Savers  MD on 01/26/2010   Method used:   Print then Give to Patient   RxID:   [id number] METFORMIN HCL 1000 MG TABS (METFORMIN HCL) bid  #180 x 6   Entered and Authorized by:   Gordy Savers  MD   Signed by:   Gordy Savers  MD on 01/26/2010   Method used:   Print then Give to Patient   RxID:   [id number]  Orders Added: 1)  Capillary Blood Glucose/CBG [82948] 2)  Est. Patient Level IV [04540] 3)  Venipuncture [36415] 4)  TLB-TSH (Thyroid  Stimulating Hormone) [84443-TSH] 5)  TLB-A1C / Hgb A1C (Glycohemoglobin) [83036-A1C] 6)  Specimen Handling [99000]

## 2010-03-30 ENCOUNTER — Other Ambulatory Visit: Payer: Self-pay | Admitting: Internal Medicine

## 2010-04-01 NOTE — Miscellaneous (Signed)
Summary: sig for omprazole  Clinical Lists Changes  Medications: Changed medication from * OMEPRAZOLE 20 MG TBEC (OMEPRAZOLE) to * OMEPRAZOLE 20 MG TBEC (OMEPRAZOLE) qd - Signed Rx of OMEPRAZOLE 20 MG TBEC (OMEPRAZOLE) qd;  #90 x 6;  Signed;  Entered by: Duard Brady LPN;  Authorized by: Gordy Savers  MD;  Method used: Faxed to [id number] [address] [place], [place]  [address], Ph: [phone] or [phone], Fax: [phone]    Prescriptions: OMEPRAZOLE 20 MG TBEC (OMEPRAZOLE) qd  #90 x 6   Entered by:   Duard Brady LPN   Authorized by:   Gordy Savers  MD   Signed by:   Duard Brady LPN on 03/47/4259   Method used:   Faxed to ...       [facility] (retail)       [address]       [place], [place]  [address]       Ph: [phone] or [phone]       Fax: [phone]   RxID:   [id number]

## 2010-04-01 NOTE — Progress Notes (Signed)
Summary: refill zolpidem - denied  Phone Note Refill Request Message from:  Fax from Pharmacy on March 23, 2010 12:12 PM  Refills Requested: Medication #1:  AMBIEN 10 MG TABS qhs   Last Refilled: 02/20/[facility] [address]    Method Requested: Fax to Local Pharmacy Initial call taken by: Duard Brady LPN,  March 23, 2010 12:13 PM  Follow-up for Phone Call        denied - was given printed rx 01/26/10 appt with refills and other medications Follow-up by: Duard Brady LPN,  March 23, 2010 12:15 PM

## 2010-04-24 ENCOUNTER — Encounter: Payer: Self-pay | Admitting: Internal Medicine

## 2010-04-27 ENCOUNTER — Ambulatory Visit (INDEPENDENT_AMBULATORY_CARE_PROVIDER_SITE_OTHER)
Admission: RE | Admit: 2010-04-27 | Discharge: 2010-04-27 | Disposition: A | Discharge status: Home / Self Care | Payer: PRIVATE HEALTH INSURANCE | Source: Ambulatory Visit | Attending: Internal Medicine | Admitting: Internal Medicine

## 2010-04-27 ENCOUNTER — Ambulatory Visit (INDEPENDENT_AMBULATORY_CARE_PROVIDER_SITE_OTHER): Payer: PRIVATE HEALTH INSURANCE | Admitting: Internal Medicine

## 2010-04-27 ENCOUNTER — Other Ambulatory Visit: Payer: Self-pay | Admitting: Internal Medicine

## 2010-04-27 ENCOUNTER — Encounter: Payer: Self-pay | Admitting: Internal Medicine

## 2010-04-27 DIAGNOSIS — B399 Histoplasmosis, unspecified: Secondary | ICD-10-CM

## 2010-04-27 DIAGNOSIS — E039 Hypothyroidism, unspecified: Secondary | ICD-10-CM

## 2010-04-27 DIAGNOSIS — E119 Type 2 diabetes mellitus without complications: Secondary | ICD-10-CM

## 2010-04-27 DIAGNOSIS — H32 Chorioretinal disorders in diseases classified elsewhere: Secondary | ICD-10-CM

## 2010-04-27 DIAGNOSIS — R918 Other nonspecific abnormal finding of lung field: Secondary | ICD-10-CM

## 2010-04-27 DIAGNOSIS — F329 Major depressive disorder, single episode, unspecified: Secondary | ICD-10-CM

## 2010-04-27 DIAGNOSIS — I10 Essential (primary) hypertension: Secondary | ICD-10-CM

## 2010-04-27 LAB — HEMOGLOBIN A1C: Hgb A1c MFr Bld: 6.8 % — ABNORMAL HIGH (ref 4.6–6.5)

## 2010-04-27 NOTE — Patient Instructions (Signed)
Limit your sodium (Salt) intake  Please check your blood pressure on a regular basis.  If it is consistently greater than 150/90, please make an office appointment.   Please check your hemoglobin A1c every 3 months    It is important that you exercise regularly, at least 20 minutes 3 to 4 times per week.  If you develop chest pain or shortness of breath seek  medical attention.  You need to lose weight.  Consider a lower calorie diet and regular exercise. 

## 2010-04-27 NOTE — Progress Notes (Signed)
  Subjective:    Patient ID: Katie Booker, female    DOB: 1945-03-10, 65 y.o.   MRN: [id number]  HPI   A 65 year old patient who is seen today for followup of her type 2 diabetes. 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 She denies any pulmonary symptoms. There is no evidence of diabetic neuropathy. Her last hemoglobin A1c 6.5. There's been some modest weight loss since September.\ She has a history of depression and presently is on alprazolam trazodone as well as Celexa. She has had a considerable situational stress and worsening depression due to her present unemployment. She is considering applying for disability due to her depression and multiple medical issues  She has treated hypertension which has been well-controlled . She has hypothyroidism and her levothyroxin dose was recently adjusted.    Review of Systems  Constitutional: Negative.   HENT: Negative for hearing loss, congestion, sore throat, rhinorrhea, dental problem, sinus pressure and tinnitus.   Eyes: Negative for pain, discharge and visual disturbance.  Respiratory: Negative for cough and shortness of breath.   Cardiovascular: Negative for chest pain, palpitations and leg swelling.  Gastrointestinal: Negative for nausea, vomiting, abdominal pain, diarrhea, constipation, blood in stool and abdominal distention.  Genitourinary: Negative for dysuria, urgency, frequency, hematuria, flank pain, vaginal bleeding, vaginal discharge, difficulty urinating, vaginal pain and pelvic pain.  Musculoskeletal: Negative for joint swelling, arthralgias and gait problem.  Skin: Negative for rash.  Neurological: Negative for dizziness, syncope, speech difficulty, weakness, numbness and headaches.  Hematological: Negative for adenopathy.  Psychiatric/Behavioral: Positive for dysphoric mood. Negative for behavioral problems and agitation. The patient is not nervous/anxious.        Objective:     Physical Exam  Constitutional: She is oriented to person, place, and time. She appears well-developed and well-nourished.        obese  HENT:  Head: Normocephalic.  Right Ear: External ear normal.  Left Ear: External ear normal.  Mouth/Throat: Oropharynx is clear and moist.  Eyes: Conjunctivae and EOM are normal. Pupils are equal, round, and reactive to light.  Neck: Normal range of motion. Neck supple. No thyromegaly present.  Cardiovascular: Normal rate, regular rhythm, normal heart sounds and intact distal pulses.   Pulmonary/Chest: Effort normal and breath sounds normal.  Abdominal: Soft. Bowel sounds are normal. She exhibits no mass. There is no tenderness.  Musculoskeletal: Normal range of motion.  Lymphadenopathy:    She has no cervical adenopathy.  Neurological: She is alert and oriented to person, place, and time.  Skin: Skin is warm and dry. No rash noted.  Psychiatric: She has a normal mood and affect. Her behavior is normal.          Assessment & Plan:   type 2 diabetes stable we'll check a hemoglobin A1c today  Hypertension stable\  hypothyroidism. We'll check a followup TSH  History of ocular histoplasmosis. We'll check a chest x-ray  depression- will  Continue  present regimen. We'll consider referral to a behavioral health specialist

## 2010-04-27 NOTE — Progress Notes (Signed)
  Subjective:    Patient ID: Katie Booker, female    DOB: 03/15/1945, 65 y.o.   MRN: [id number]  HPI Wt Readings from Last 3 Encounters:  04/27/10 200 lb (90.719 kg)  01/26/10 201 lb (91.173 kg)  11/20/09 205 lb (92.987 kg)      Review of Systems     Objective:   Physical Exam        Assessment & Plan:

## 2010-04-29 ENCOUNTER — Ambulatory Visit (INDEPENDENT_AMBULATORY_CARE_PROVIDER_SITE_OTHER)
Admission: RE | Admit: 2010-04-29 | Discharge: 2010-04-29 | Disposition: A | Discharge status: Home / Self Care | Payer: PRIVATE HEALTH INSURANCE | Source: Ambulatory Visit | Attending: Internal Medicine | Admitting: Internal Medicine

## 2010-04-29 DIAGNOSIS — R918 Other nonspecific abnormal finding of lung field: Secondary | ICD-10-CM

## 2010-05-01 ENCOUNTER — Other Ambulatory Visit: Payer: Self-pay | Admitting: Internal Medicine

## 2010-05-01 NOTE — Progress Notes (Signed)
Quick Note:  docotr spoke with pt - pet scan ordered. KIK ______

## 2010-05-04 ENCOUNTER — Other Ambulatory Visit: Payer: Self-pay | Admitting: Internal Medicine

## 2010-05-04 DIAGNOSIS — R918 Other nonspecific abnormal finding of lung field: Secondary | ICD-10-CM

## 2010-05-12 ENCOUNTER — Encounter (HOSPITAL_COMMUNITY): Payer: Self-pay

## 2010-05-12 ENCOUNTER — Encounter (HOSPITAL_COMMUNITY)
Admission: RE | Admit: 2010-05-12 | Discharge: 2010-05-12 | Disposition: A | Discharge status: Home / Self Care | Payer: PRIVATE HEALTH INSURANCE | Source: Ambulatory Visit | Attending: Internal Medicine | Admitting: Internal Medicine

## 2010-05-12 DIAGNOSIS — R918 Other nonspecific abnormal finding of lung field: Secondary | ICD-10-CM

## 2010-05-12 MED ORDER — FLUDEOXYGLUCOSE F - 18 (FDG) INJECTION
18.7000 | Freq: Once | INTRAVENOUS | Status: AC | PRN
  Administered 2010-05-12: 18.7 via INTRAVENOUS

## 2010-06-17 ENCOUNTER — Other Ambulatory Visit: Payer: Self-pay | Admitting: Internal Medicine

## 2010-07-26 ENCOUNTER — Other Ambulatory Visit: Payer: Self-pay | Admitting: Internal Medicine

## 2010-07-28 ENCOUNTER — Encounter: Payer: Self-pay | Admitting: Internal Medicine

## 2010-07-28 ENCOUNTER — Ambulatory Visit (INDEPENDENT_AMBULATORY_CARE_PROVIDER_SITE_OTHER): Payer: PRIVATE HEALTH INSURANCE | Admitting: Internal Medicine

## 2010-07-28 DIAGNOSIS — F329 Major depressive disorder, single episode, unspecified: Secondary | ICD-10-CM

## 2010-07-28 DIAGNOSIS — E119 Type 2 diabetes mellitus without complications: Secondary | ICD-10-CM

## 2010-07-28 DIAGNOSIS — I10 Essential (primary) hypertension: Secondary | ICD-10-CM

## 2010-07-28 DIAGNOSIS — E785 Hyperlipidemia, unspecified: Secondary | ICD-10-CM

## 2010-07-28 DIAGNOSIS — M1990 Unspecified osteoarthritis, unspecified site: Secondary | ICD-10-CM

## 2010-07-28 MED ORDER — LEVOTHYROXINE SODIUM 150 MCG PO TABS: 150.0000 ug | ORAL_TABLET | Freq: Every day | ORAL | Status: DC

## 2010-07-28 MED ORDER — PILOCARPINE HCL 5 MG PO TABS: 5.0000 mg | ORAL_TABLET | Freq: Three times a day (TID) | ORAL | Status: DC

## 2010-07-28 MED ORDER — GLIMEPIRIDE 2 MG PO TABS: 2.0000 mg | ORAL_TABLET | Freq: Every day | ORAL | Status: DC

## 2010-07-28 MED ORDER — GEMFIBROZIL 600 MG PO TABS: 600.0000 mg | ORAL_TABLET | Freq: Two times a day (BID) | ORAL | Status: DC

## 2010-07-28 MED ORDER — ALPRAZOLAM 0.5 MG PO TABS: 0.5000 mg | ORAL_TABLET | Freq: Every day | ORAL | Status: DC | PRN

## 2010-07-28 MED ORDER — TRAZODONE HCL 100 MG PO TABS: 100.0000 mg | ORAL_TABLET | Freq: Every day | ORAL | Status: DC

## 2010-07-28 MED ORDER — PREGABALIN 75 MG PO CAPS: 75.0000 mg | ORAL_CAPSULE | Freq: Every day | ORAL | Status: DC

## 2010-07-28 MED ORDER — OMEPRAZOLE 20 MG PO CPDR: 20.0000 mg | DELAYED_RELEASE_CAPSULE | Freq: Every day | ORAL | Status: DC

## 2010-07-28 MED ORDER — FLUTICASONE PROPIONATE 50 MCG/ACT NA SUSP: 1.0000 | Freq: Two times a day (BID) | NASAL | Status: DC

## 2010-07-28 MED ORDER — CITALOPRAM HYDROBROMIDE 40 MG PO TABS: 40.0000 mg | ORAL_TABLET | Freq: Every day | ORAL | Status: DC

## 2010-07-28 MED ORDER — GLIMEPIRIDE 4 MG PO TABS: 2.0000 mg | ORAL_TABLET | Freq: Every day | ORAL | Status: DC

## 2010-07-28 MED ORDER — ZOLPIDEM TARTRATE 10 MG PO TABS: 10.0000 mg | ORAL_TABLET | Freq: Every evening | ORAL | Status: DC | PRN

## 2010-07-28 MED ORDER — FUROSEMIDE 20 MG PO TABS: 20.0000 mg | ORAL_TABLET | Freq: Every day | ORAL | Status: DC

## 2010-07-28 MED ORDER — METFORMIN HCL 1000 MG PO TABS: 1000.0000 mg | ORAL_TABLET | Freq: Two times a day (BID) | ORAL | Status: DC

## 2010-07-28 NOTE — Progress Notes (Signed)
  Subjective:    Patient ID: Binta Statzer, female    DOB: June 05, 1945, 65 y.o.   MRN: [id number]  HPI  65 year old patient who is seen today for followup of her type 2 diabetes. Her fasting blood sugars are generally well controlled. She has frequent mid afternoon low blood sugar readings in the 60s and is mildly symptomatic. Her last hemoglobin A1c was 6.8. Complaints include mild sore throat and sinus congestion that are improving. She does complain of significant dry mouth and is requesting a prescription for pilocarpine recommended by her dentist. She has hypertension and dyslipidemia. She is requesting refills on all her medications. She has hypertension that has been well-controlled. She has a history of depression which has been controlled on Celexa.    Review of Systems  Constitutional: Negative.   HENT: Negative for hearing loss, congestion, sore throat, rhinorrhea, dental problem, sinus pressure and tinnitus.   Eyes: Negative for pain, discharge and visual disturbance.  Respiratory: Negative for cough and shortness of breath.   Cardiovascular: Negative for chest pain, palpitations and leg swelling.  Gastrointestinal: Negative for nausea, vomiting, abdominal pain, diarrhea, constipation, blood in stool and abdominal distention.  Genitourinary: Negative for dysuria, urgency, frequency, hematuria, flank pain, vaginal bleeding, vaginal discharge, difficulty urinating, vaginal pain and pelvic pain.  Musculoskeletal: Negative for joint swelling, arthralgias and gait problem.  Skin: Negative for rash.  Neurological: Negative for dizziness, syncope, speech difficulty, weakness, numbness and headaches.  Hematological: Negative for adenopathy.  Psychiatric/Behavioral: Negative for behavioral problems, dysphoric mood and agitation. The patient is not nervous/anxious.        Objective:   Physical Exam  Constitutional: She is oriented to person, place, and time. She appears well-developed  and well-nourished.  HENT:  Head: Normocephalic.  Right Ear: External ear normal.  Left Ear: External ear normal.  Mouth/Throat: Oropharynx is clear and moist.  Eyes: Conjunctivae and EOM are normal. Pupils are equal, round, and reactive to light.  Neck: Normal range of motion. Neck supple. No thyromegaly present.  Cardiovascular: Normal rate, regular rhythm, normal heart sounds and intact distal pulses.   Pulmonary/Chest: Effort normal and breath sounds normal.  Abdominal: Soft. Bowel sounds are normal. She exhibits no mass. There is no tenderness.  Musculoskeletal: Normal range of motion.  Lymphadenopathy:    She has no cervical adenopathy.  Neurological: She is alert and oriented to person, place, and time.  Skin: Skin is warm and dry. No rash noted.  Psychiatric: She has a normal mood and affect. Her behavior is normal.          Assessment & Plan:   Diabetes mellitus. We'll decrease Amaryl to  1 mg daily. Hemoglobin A1c will be checked Hypertension well controlled. We'll continue her present regimen Depression. Reason is stable all medications refilled Hypothyroidism Osteoarthritis

## 2010-07-28 NOTE — Patient Instructions (Signed)
Limit your sodium (Salt) intake  You need to lose weight.  Consider a lower calorie diet and regular exercise.   Please check your hemoglobin A1c every 3 months    It is important that you exercise regularly, at least 20 minutes 3 to 4 times per week.  If you develop chest pain or shortness of breath seek  medical attention. 

## 2010-07-29 LAB — HEMOGLOBIN A1C: Hgb A1c MFr Bld: 7.3 % — ABNORMAL HIGH (ref 4.6–6.5)

## 2010-08-13 ENCOUNTER — Telehealth: Payer: Self-pay

## 2010-08-14 ENCOUNTER — Ambulatory Visit (INDEPENDENT_AMBULATORY_CARE_PROVIDER_SITE_OTHER): Payer: PRIVATE HEALTH INSURANCE | Admitting: Internal Medicine

## 2010-08-14 ENCOUNTER — Encounter: Payer: Self-pay | Admitting: Internal Medicine

## 2010-08-14 DIAGNOSIS — R21 Rash and other nonspecific skin eruption: Secondary | ICD-10-CM

## 2010-08-14 DIAGNOSIS — E119 Type 2 diabetes mellitus without complications: Secondary | ICD-10-CM

## 2010-08-14 MED ORDER — NYSTATIN-TRIAMCINOLONE 100000-0.1 UNIT/GM-% EX OINT: TOPICAL_OINTMENT | Freq: Two times a day (BID) | CUTANEOUS | Status: AC

## 2010-08-14 NOTE — Patient Instructions (Signed)
Call or return to clinic prn if these symptoms worsen or fail to improve as anticipated.

## 2010-08-14 NOTE — Progress Notes (Signed)
  Subjective:    Patient ID: Katie Booker, female    DOB: 10/19/1945, 65 y.o.   MRN: [id number]  HPI  65 year old patient with history of type 2 diabetes who presents with a several-day history of a perineal rash. She is also noted a flareup of her external hemorrhoids. She's tried a number of topical medications without much benefit.   Review of Systems  Skin: Positive for rash.       Objective:   Physical Exam  Constitutional: She is oriented to person, place, and time. She appears well-developed and well-nourished.  HENT:  Head: Normocephalic.  Right Ear: External ear normal.  Left Ear: External ear normal.  Mouth/Throat: Oropharynx is clear and moist.  Eyes: Conjunctivae and EOM are normal. Pupils are equal, round, and reactive to light.  Neck: Normal range of motion. Neck supple. No thyromegaly present.  Cardiovascular: Normal rate, regular rhythm, normal heart sounds and intact distal pulses.   Pulmonary/Chest: Effort normal and breath sounds normal.  Abdominal: Soft. Bowel sounds are normal. She exhibits no mass. There is no tenderness.  Genitourinary:       External hemorrhoids were noted Significant perineal erythema in the perirectal area and perineum  Musculoskeletal: Normal range of motion.  Lymphadenopathy:    She has no cervical adenopathy.  Neurological: She is alert and oriented to person, place, and time.  Skin: Skin is warm and dry. No rash noted.  Psychiatric: She has a normal mood and affect. Her behavior is normal.          Assessment & Plan:   Perineal dermatitis. Will treat with Gyne-Lotrimin cream. Call if not improved

## 2010-08-14 NOTE — Telephone Encounter (Signed)
error 

## 2010-09-15 ENCOUNTER — Other Ambulatory Visit: Payer: Self-pay | Admitting: Internal Medicine

## 2010-09-23 ENCOUNTER — Other Ambulatory Visit: Payer: Self-pay | Admitting: Internal Medicine

## 2010-09-26 ENCOUNTER — Other Ambulatory Visit: Payer: Self-pay | Admitting: Internal Medicine

## 2010-10-29 ENCOUNTER — Ambulatory Visit (INDEPENDENT_AMBULATORY_CARE_PROVIDER_SITE_OTHER): Payer: PRIVATE HEALTH INSURANCE | Admitting: Internal Medicine

## 2010-10-29 ENCOUNTER — Encounter: Payer: Self-pay | Admitting: Internal Medicine

## 2010-10-29 DIAGNOSIS — E119 Type 2 diabetes mellitus without complications: Secondary | ICD-10-CM

## 2010-10-29 DIAGNOSIS — H32 Chorioretinal disorders in diseases classified elsewhere: Secondary | ICD-10-CM | POA: Insufficient documentation

## 2010-10-29 DIAGNOSIS — E039 Hypothyroidism, unspecified: Secondary | ICD-10-CM

## 2010-10-29 DIAGNOSIS — I10 Essential (primary) hypertension: Secondary | ICD-10-CM

## 2010-10-29 DIAGNOSIS — B399 Histoplasmosis, unspecified: Secondary | ICD-10-CM | POA: Insufficient documentation

## 2010-10-29 LAB — CBC WITH DIFFERENTIAL/PLATELET
Eosinophils Relative: 6.6 % — ABNORMAL HIGH (ref 0.0–5.0)
HCT: 39 % (ref 36.0–46.0)
Hemoglobin: 12.9 g/dL (ref 12.0–15.0)
Lymphs Abs: 2.4 10*3/uL (ref 0.7–4.0)
MCV: 81.6 fl (ref 78.0–100.0)
Monocytes Absolute: 0.6 10*3/uL (ref 0.1–1.0)
Monocytes Relative: 8.2 % (ref 3.0–12.0)
Neutro Abs: 4.2 10*3/uL (ref 1.4–7.7)
Platelets: 237 10*3/uL (ref 150.0–400.0)
WBC: 7.7 10*3/uL (ref 4.5–10.5)

## 2010-10-29 LAB — HEMOGLOBIN A1C: Hgb A1c MFr Bld: 7.1 % — ABNORMAL HIGH (ref 4.6–6.5)

## 2010-10-29 MED ORDER — OMEPRAZOLE 20 MG PO CPDR: 20.0000 mg | DELAYED_RELEASE_CAPSULE | Freq: Every day | ORAL | Status: DC

## 2010-10-29 MED ORDER — FUROSEMIDE 20 MG PO TABS: 20.0000 mg | ORAL_TABLET | Freq: Every day | ORAL | Status: DC

## 2010-10-29 MED ORDER — ZOLPIDEM TARTRATE 10 MG PO TABS: 10.0000 mg | ORAL_TABLET | Freq: Every evening | ORAL | Status: DC | PRN

## 2010-10-29 MED ORDER — NAPROXEN SODIUM 550 MG PO TABS: 550.0000 mg | ORAL_TABLET | Freq: Two times a day (BID) | ORAL | Status: DC

## 2010-10-29 MED ORDER — ALPRAZOLAM 0.5 MG PO TABS: 0.5000 mg | ORAL_TABLET | Freq: Every day | ORAL | Status: DC | PRN

## 2010-10-29 NOTE — Progress Notes (Signed)
  Subjective:    Patient ID: Katie Booker, female    DOB: [birth year], 65 y.o.   MRN: [id number]  HPI  Wt Readings from Last 3 Encounters:  10/29/10 201 lb (91.173 kg)  08/14/10 203 lb (92.08 kg)  07/28/10 205 lb (92.61 kg)   65 year old patient who is seen today for followup. She has a history of type 2 diabetes. 3 months ago her he will A1c trended up to 7.3 she feels well. There has been some modest weight loss. 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 Her in a year a chest x-ray suggested a pulmonary nodule and subsequent chest CT revealed the multiple nodules. To exclude cancer a PET scan was performed. Today she feels quite well does require some medication refills. She has some mild fatigue. No recent CBC  Review of Systems  Constitutional: Positive for fatigue.  HENT: Negative for hearing loss, congestion, sore throat, rhinorrhea, dental problem, sinus pressure and tinnitus.   Eyes: Negative for pain, discharge and visual disturbance.  Respiratory: Negative for cough and shortness of breath.   Cardiovascular: Negative for chest pain, palpitations and leg swelling.  Gastrointestinal: Negative for nausea, vomiting, abdominal pain, diarrhea, constipation, blood in stool and abdominal distention.  Genitourinary: Negative for dysuria, urgency, frequency, hematuria, flank pain, vaginal bleeding, vaginal discharge, difficulty urinating, vaginal pain and pelvic pain.  Musculoskeletal: Negative for joint swelling, arthralgias and gait problem.  Skin: Negative for rash.  Neurological: Negative for dizziness, syncope, speech difficulty, weakness, numbness and headaches.  Hematological: Negative for adenopathy.  Psychiatric/Behavioral: Negative for behavioral problems, dysphoric mood and agitation. The patient is not nervous/anxious.        Objective:   Physical Exam  Constitutional: She is oriented to person, place, and time. She appears  well-developed and well-nourished.  HENT:  Head: Normocephalic.  Right Ear: External ear normal.  Left Ear: External ear normal.  Mouth/Throat: Oropharynx is clear and moist.  Eyes: Conjunctivae and EOM are normal. Pupils are equal, round, and reactive to light.  Neck: Normal range of motion. Neck supple. No thyromegaly present.  Cardiovascular: Normal rate, regular rhythm, normal heart sounds and intact distal pulses.   Pulmonary/Chest: Effort normal and breath sounds normal.  Abdominal: Soft. Bowel sounds are normal. She exhibits no mass. There is no tenderness.  Musculoskeletal: Normal range of motion.  Lymphadenopathy:    She has no cervical adenopathy.  Neurological: She is alert and oriented to person, place, and time.  Skin: Skin is warm and dry. No rash noted.  Psychiatric: She has a normal mood and affect. Her behavior is normal.          Assessment & Plan:    Diabetes mellitus. Will check a hemoglobin A1c. Diet weight loss exercise all encouraged Hypertension stable Dyslipidemia History depression medications refilled

## 2010-10-29 NOTE — Patient Instructions (Signed)
You need to lose weight.  Consider a lower calorie diet and regular exercise.   Please check your hemoglobin A1c every 3 months  Limit your sodium (Salt) intake   

## 2010-11-10 ENCOUNTER — Telehealth: Payer: Self-pay | Admitting: *Deleted

## 2010-11-10 NOTE — Telephone Encounter (Signed)
General surgery referral made to [facility].

## 2010-11-10 NOTE — Telephone Encounter (Signed)
Pt is calling to remind Dr. Kirtland Bouchard to make a referral to a surgeon for hemorrhoids, please.  She would like to have her lab results also.

## 2010-11-10 NOTE — Telephone Encounter (Signed)
All labs are okay hemoglobin A1c 7.1. Please  schedule for general surgery referral due to symptomatic hemorrhoids

## 2010-11-18 ENCOUNTER — Telehealth: Payer: Self-pay | Admitting: Internal Medicine

## 2010-11-18 ENCOUNTER — Ambulatory Visit (INDEPENDENT_AMBULATORY_CARE_PROVIDER_SITE_OTHER): Payer: PRIVATE HEALTH INSURANCE | Admitting: Surgery

## 2010-11-18 NOTE — Telephone Encounter (Signed)
Pt called req to get lab results. Pls call asap.

## 2010-11-18 NOTE — Telephone Encounter (Signed)
Please advise 

## 2010-11-18 NOTE — Telephone Encounter (Signed)
Spoke with pt - informed of labs and instructions

## 2010-11-18 NOTE — Telephone Encounter (Signed)
All labs okay. Hemoglobin A1c 7.1 slightly elevated with the goal less than 7. Encourage more exercise better diet and modest weight loss. Recheck 3 months

## 2010-11-26 ENCOUNTER — Ambulatory Visit (INDEPENDENT_AMBULATORY_CARE_PROVIDER_SITE_OTHER): Payer: PRIVATE HEALTH INSURANCE | Admitting: Surgery

## 2010-11-26 ENCOUNTER — Encounter (INDEPENDENT_AMBULATORY_CARE_PROVIDER_SITE_OTHER): Payer: Self-pay | Admitting: Surgery

## 2010-11-26 VITALS — BP 148/78 | HR 80 | Temp 97.1°F | Resp 20 | Ht 62.0 in | Wt 202.4 lb

## 2010-11-26 DIAGNOSIS — K602 Anal fissure, unspecified: Secondary | ICD-10-CM

## 2010-11-26 DIAGNOSIS — K648 Other hemorrhoids: Secondary | ICD-10-CM

## 2010-11-26 NOTE — Patient Instructions (Signed)
Anal Fissure     An anal fissure often begins with sharp pain. This is usually following a bowel movement. It often causes bright red blood stained stools. It is the most common cause of rectal bleeding. One common cause of this is passage of a large, hard stool. It can also be caused by having frequent diarrheal stools. Anal fissures that occur for a longtime (chronic) may require surgery.     CAUSES   Passing large, hard stools.   Frequent diarrheal stools.   Constipation.     SYMPTOMS   Bright red, blood stained stools.   Rectal bleeding.     HOME CARE INSTRUCTIONS   If constipation is the cause of the rectal fissure, it may be necessary to add a bulk-forming laxative. A diet high in fruits, whole grains, and vegetables will also help.   Taking hot sitz baths for 1 half hour 4 times per day may help.   Increase your fluid intake.   Only take over-the-counter or prescription medicines for pain, discomfort, or fever as directed by your caregiver. Do not take aspirin as this may increase the tendency for bleeding.   Do not use ointments containing anesthetic medications or hydrocortisone. They could slow healing.   Avoid constipating foods such as bananas and cheese.   In children, brown Karo syrup may be used by adding 1 teaspoon of syrup to 8 ounces of formula. An alternative is to give 3 teaspoons of mineral oil every day.     SEEK MEDICAL CARE IF:  Rectal bleeding continues, changes in intensity, or becomes more severe.     MAKE SURE YOU:     Understand these instructions.     Will watch your condition.   Will get help right away if you are not doing well or get worse.     Document Released: 02/08/2005  Document Re-Released: 05/05/2009  [employer] Patient Information 2011 [employer]

## 2010-11-26 NOTE — Progress Notes (Signed)
Chief Complaint  Patient presents with  . Other    new pt- eval hems    HPI Katie Booker is a 65 y.o. female.  HPI The patient presents at the request of Dr.Kwiatkowski due to rectal bleeding and pain. She has history of anal fissure. She also has a history of internal hemorrhoids. She's having more bleeding, burning, and pain at defecation. Over the last 4 weeks, the symptoms have worsened. She has tried topical ointments with no relief.  Past Medical History  Diagnosis Date  . ANEMIA-NOS 09/18/2009  . DEPRESSION 09/18/2009  . DIABETES MELLITUS, TYPE II 09/18/2009  . GERD 09/18/2009  . HYPERLIPIDEMIA 09/18/2009  . HYPERTENSION 09/18/2009  . HYPOTHYROIDISM 09/18/2009  . OSTEOARTHRITIS 09/18/2009  . Obesity   . OSA (obstructive sleep apnea)   . Cancer     Past Surgical History  Procedure Date  . Tubal fulgaration 1991  . Colonoscopy 2007  . Esophagogastroduodenoscopy 2007    Family History  Problem Relation Age of Onset  . Diabetes Father   . COPD Father   . Cancer Maternal Grandfather     stomach    Social History History  Substance Use Topics  . Smoking status: Former Smoker    Quit date: 02/23/2004  . Smokeless tobacco: Not on file   Comment: quit 2007  . Alcohol Use: Yes    Allergies  Allergen Reactions  . Codeine     REACTION: rash  . Fluticasone Propionate     REACTION: rash    Current Outpatient Prescriptions  Medication Sig Dispense Refill  . acetaminophen (TYLENOL ARTHRITIS PAIN) 650 MG CR tablet Take 650 mg by mouth every 8 (eight) hours as needed.        . ALPRAZolam (XANAX) 0.5 MG tablet Take 1 tablet (0.5 mg total) by mouth daily as needed.  30 tablet  3  . atenolol-chlorthalidone (TENORETIC) 50-25 MG per tablet TAKE 1 TABLET IN THE MORNING  90 tablet  3  . Calcium Carbonate-Vit D-Min (GNP CALCIUM 600 PLUS D/MINERAL) 600-400 MG-UNIT TABS Take 1 tablet by mouth 2 (two) times daily.        . citalopram (CELEXA) 40 MG tablet Take 1 tablet (40 mg  total) by mouth daily.  90 tablet  6  . furosemide (LASIX) 20 MG tablet Take 1 tablet (20 mg total) by mouth daily.  90 tablet  6  . gemfibrozil (LOPID) 600 MG tablet Take 1 tablet (600 mg total) by mouth 2 (two) times daily before a meal.  180 tablet  6  . glimepiride (AMARYL) 2 MG tablet Take 1 mg by mouth daily before breakfast.        . glucose blood (ONE TOUCH ULTRA TEST) test strip 1 each by Other route daily. Use as instructed       . Lancets (ONETOUCH ULTRASOFT) lancets 1 each by Other route daily. Use as instructed       . levothyroxine (SYNTHROID, LEVOTHROID) 150 MCG tablet Take 1 tablet (150 mcg total) by mouth daily.  90 tablet  6  . metFORMIN (GLUCOPHAGE) 1000 MG tablet Take 1 tablet (1,000 mg total) by mouth 2 (two) times daily with a meal.  180 tablet  6  . Multiple Vitamin (MULTIVITAMIN) tablet Take 1 tablet by mouth daily.        . Multiple Vitamins-Minerals (EYE VITAMINS) TABS Take by mouth daily.        . naproxen sodium (ANAPROX) 550 MG tablet Take 1 tablet (550 mg total) by  mouth 2 (two) times daily with a meal.  180 tablet  2  . nystatin-triamcinolone (MYCOLOG) ointment Apply topically 2 (two) times daily.  60 g  2  . omeprazole (PRILOSEC) 20 MG capsule Take 1 capsule (20 mg total) by mouth daily.  90 capsule  6  . pilocarpine (SALAGEN) 5 MG tablet Take 1 tablet (5 mg total) by mouth 3 (three) times daily.  90 tablet  5  . polyethylene glycol powder (GLYCOLAX/MIRALAX) powder Take 17 g by mouth daily.        . pregabalin (LYRICA) 75 MG capsule Take 1 capsule (75 mg total) by mouth daily.  90 capsule  6  . traZODone (DESYREL) 100 MG tablet Take 1 tablet (100 mg total) by mouth at bedtime.  90 tablet  6  . zolpidem (AMBIEN) 10 MG tablet Take 1 tablet (10 mg total) by mouth at bedtime as needed.  30 tablet  3    Review of Systems Review of Systems  Constitutional: Negative.   HENT: Negative.   Eyes: Negative.   Respiratory: Negative.   Cardiovascular: Negative.     Gastrointestinal: Positive for anal bleeding and rectal pain.  Genitourinary: Negative.   Musculoskeletal: Negative.   Neurological: Negative.   Hematological: Negative.     Blood pressure 148/78, pulse 80, temperature 97.1 F (36.2 C), temperature source Temporal, resp. rate 20, height 5\' 2"  (1.575 m), weight 202 lb 6.4 oz (91.808 kg).  Physical Exam Physical Exam  Constitutional: She is oriented to person, place, and time. She appears well-developed and well-nourished.  HENT:  Head: Normocephalic and atraumatic.  Eyes: EOM are normal. Pupils are equal, round, and reactive to light.  Neck: Normal range of motion.  Cardiovascular: Normal rate, regular rhythm and normal heart sounds.   Pulmonary/Chest: Effort normal.  Genitourinary:       Anterior anal fissure.  3 column internal and external hemorrhoid disease. Irritated.  Musculoskeletal: Normal range of motion.  Neurological: She is alert and oriented to person, place, and time.  Skin: Skin is warm and dry.  Psychiatric: She has a normal mood and affect. Her behavior is normal. Judgment and thought content normal.    Data Reviewed   Assessment    Anal fissure Internal and external grade 3 hemorrhoid disease.    Plan    Medical management of anal fissure.  Try diltiazem gel q6 hours.  RTC next month.  If no improvement,  Will need lateral internal sphincterotomy and hemorrhoidectomy.       Cree Kunert A. 11/26/2010, 5:38 PM

## 2011-01-06 ENCOUNTER — Ambulatory Visit (INDEPENDENT_AMBULATORY_CARE_PROVIDER_SITE_OTHER): Payer: PRIVATE HEALTH INSURANCE | Admitting: Surgery

## 2011-01-06 ENCOUNTER — Encounter (INDEPENDENT_AMBULATORY_CARE_PROVIDER_SITE_OTHER): Payer: Self-pay | Admitting: Surgery

## 2011-01-06 VITALS — BP 170/80 | HR 92 | Temp 97.6°F | Resp 20 | Ht 62.0 in | Wt 199.4 lb

## 2011-01-06 DIAGNOSIS — K649 Unspecified hemorrhoids: Secondary | ICD-10-CM

## 2011-01-06 DIAGNOSIS — K602 Anal fissure, unspecified: Secondary | ICD-10-CM

## 2011-01-06 NOTE — Patient Instructions (Signed)
Hemorrhoids Hemorrhoids are enlarged (dilated) veins around the rectum. There are 2 types of hemorrhoids, and the type of hemorrhoid is determined by its location. Internal hemorrhoids occur in the veins just inside the rectum.They are usually not painful, but they may bleed.However, they may poke through to the outside and become irritated and painful. External hemorrhoids involve the veins outside the anus and can be felt as a painful swelling or hard lump near the anus.They are often itchy and may crack and bleed. Sometimes clots will form in the veins. This makes them swollen and painful. These are called thrombosed hemorrhoids. CAUSES Causes of hemorrhoids include: Pregnancy. This increases the pressure in the hemorrhoidal veins.  Constipation.  Straining to have a bowel movement.  Obesity.  Heavy lifting or other activity that caused you to strain.  TREATMENT Most of the time hemorrhoids improve in 1 to 2 weeks. However, if symptoms do not seem to be getting better or if you have a lot of rectal bleeding, your caregiver may perform a procedure to help make the hemorrhoids get smaller or remove them completely.Possible treatments include: Rubber band ligation. A rubber band is placed at the base of the hemorrhoid to cut off the circulation.  Sclerotherapy. A chemical is injected to shrink the hemorrhoid.  Infrared light therapy. Tools are used to burn the hemorrhoid.  Hemorrhoidectomy. This is surgical removal of the hemorrhoid.  HOME CARE INSTRUCTIONS  Increase fiber in your diet. Ask your caregiver about using fiber supplements.  Drink enough water and fluids to keep your urine clear or pale yellow.  Exercise regularly.  Go to the bathroom when you have the urge to have a bowel movement. Do not wait.  Avoid straining to have bowel movements.  Keep the anal area dry and clean.  Only take over-the-counter or prescription medicines for pain, discomfort, or fever as directed by your  caregiver.  If your hemorrhoids are thrombosed: Take warm sitz baths for 20 to 30 minutes, 3 to 4 times per day.  If the hemorrhoids are very tender and swollen, place ice packs on the area as tolerated. Using ice packs between sitz baths may be helpful. Fill a plastic bag with ice. Place a towel between the bag of ice and your skin.  Medicated creams and suppositories may be used or applied as directed.  Do not use a donut-shaped pillow or sit on the toilet for long periods. This increases blood pooling and pain.  SEEK MEDICAL CARE IF:  You have increasing pain and swelling that is not controlled with your medicine.  You have uncontrolled bleeding.  You have difficulty or you are unable to have a bowel movement.  You have pain or inflammation outside the area of the hemorrhoids.  You have chills or an oral temperature above 102 F (38.9 C).  MAKE SURE YOU:  Understand these instructions.  Will watch your condition.  Will get help right away if you are not doing well or get worse.  Document Released: 02/06/2000 Document Revised: 10/21/2010 Document Reviewed: 06/13/2007 [facility] Patient Information 2012 [place], [place].Anal Fissure, Adult An anal fissure is a small tear or crack in the skin around the anus. Bleeding from a fissure usually stops on its own within a few minutes. However, bleeding will often reoccur with each bowel movement until the crack heals.  CAUSES   Passing large, hard stools.   Frequent diarrheal stools.   Constipation.   Inflammatory bowel disease (Crohn's disease or ulcerative colitis).   Infections.  Anal sex.  SYMPTOMS   Small amounts of blood seen on your stools, on toilet paper, or in the toilet after a bowel movement.   Rectal bleeding.   Painful bowel movements.   Itching or irritation around the anus.  DIAGNOSIS Your caregiver will examine the anal area. An anal fissure can usually be seen with careful inspection. A rectal exam may be  performed and a short tube (anoscope) may be used to examine the anal canal. TREATMENT   You may be instructed to take fiber supplements. These supplements can soften your stool to help make bowel movements easier.   Sitz baths may be recommended to help heal the tear. Do not use soap in the sitz baths.   A medicated cream or ointment may be prescribed to lessen discomfort.  HOME CARE INSTRUCTIONS   Maintain a diet high in fruits, whole grains, and vegetables. Avoid constipating foods like bananas and dairy products.   Take sitz baths as directed by your caregiver.   Drink enough fluids to keep your urine clear or pale yellow.   Only take over-the-counter or prescription medicines for pain, discomfort, or fever as directed by your caregiver. Do not take aspirin as this may increase bleeding.   Do not use ointments containing numbing medications (anesthetics) or hydrocortisone. They could slow healing.  SEEK MEDICAL CARE IF:   Your fissure is not completely healed within 3 days.   You have further bleeding.   You have a fever.   You have diarrhea mixed with blood.   You have pain.   Your problem is getting worse rather than better.  MAKE SURE YOU:   Understand these instructions.   Will watch your condition.   Will get help right away if you are not doing well or get worse.  Document Released: 02/08/2005 Document Revised: 10/21/2010 Document Reviewed: 07/26/2010 [facility] Patient Information 2012 [place], [place].

## 2011-01-06 NOTE — Progress Notes (Signed)
Chief Complaint  Patient presents with  . Routine Post Op    5 wk reck hems    HPI Katie Booker is a 65 y.o. female.  HPI The patient presents at the request of Dr.Kwiatkowski due to rectal bleeding and pain. She has history of anal fissure. She also has a history of internal hemorrhoids. She's having more bleeding, burning, and pain at defecation. Over the last 4 weeks, the symptoms have worsened. She has tried topical ointments with no relief. She returns with no improvement in symptoms.  Past Medical History  Diagnosis Date  . ANEMIA-NOS 09/18/2009  . DEPRESSION 09/18/2009  . DIABETES MELLITUS, TYPE II 09/18/2009  . GERD 09/18/2009  . HYPERLIPIDEMIA 09/18/2009  . HYPERTENSION 09/18/2009  . HYPOTHYROIDISM 09/18/2009  . OSTEOARTHRITIS 09/18/2009  . Obesity   . OSA (obstructive sleep apnea)   . Cancer   . Hemorrhoids   . Anal fissure     Past Surgical History  Procedure Date  . Tubal fulgaration 1991  . Colonoscopy 2007  . Esophagogastroduodenoscopy 2007    Family History  Problem Relation Age of Onset  . Diabetes Father   . COPD Father   . Cancer Maternal Grandfather     stomach    Social History History  Substance Use Topics  . Smoking status: Former Smoker    Quit date: 02/23/2004  . Smokeless tobacco: Not on file   Comment: quit 2007  . Alcohol Use: Yes    Allergies  Allergen Reactions  . Codeine     REACTION: rash  . Fluticasone Propionate     REACTION: rash    Current Outpatient Prescriptions  Medication Sig Dispense Refill  . acetaminophen (TYLENOL ARTHRITIS PAIN) 650 MG CR tablet Take 650 mg by mouth every 8 (eight) hours as needed.        . ALPRAZolam (XANAX) 0.5 MG tablet Take 1 tablet (0.5 mg total) by mouth daily as needed.  30 tablet  3  . atenolol-chlorthalidone (TENORETIC) 50-25 MG per tablet TAKE 1 TABLET IN THE MORNING  90 tablet  3  . Calcium Carbonate-Vit D-Min (GNP CALCIUM 600 PLUS D/MINERAL) 600-400 MG-UNIT TABS Take 1 tablet by mouth 2  (two) times daily.        . citalopram (CELEXA) 40 MG tablet Take 1 tablet (40 mg total) by mouth daily.  90 tablet  6  . furosemide (LASIX) 20 MG tablet Take 1 tablet (20 mg total) by mouth daily.  90 tablet  6  . gemfibrozil (LOPID) 600 MG tablet Take 1 tablet (600 mg total) by mouth 2 (two) times daily before a meal.  180 tablet  6  . glimepiride (AMARYL) 2 MG tablet Take 1 mg by mouth daily before breakfast.        . glucose blood (ONE TOUCH ULTRA TEST) test strip 1 each by Other route daily. Use as instructed       . Lancets (ONETOUCH ULTRASOFT) lancets 1 each by Other route daily. Use as instructed       . levothyroxine (SYNTHROID, LEVOTHROID) 150 MCG tablet Take 1 tablet (150 mcg total) by mouth daily.  90 tablet  6  . metFORMIN (GLUCOPHAGE) 1000 MG tablet Take 1 tablet (1,000 mg total) by mouth 2 (two) times daily with a meal.  180 tablet  6  . Multiple Vitamin (MULTIVITAMIN) tablet Take 1 tablet by mouth daily.        . Multiple Vitamins-Minerals (EYE VITAMINS) TABS Take by mouth daily.        Marland Kitchen  naproxen sodium (ANAPROX) 550 MG tablet Take 1 tablet (550 mg total) by mouth 2 (two) times daily with a meal.  180 tablet  2  . nystatin-triamcinolone (MYCOLOG) ointment Apply topically 2 (two) times daily.  60 g  2  . omeprazole (PRILOSEC) 20 MG capsule Take 1 capsule (20 mg total) by mouth daily.  90 capsule  6  . pilocarpine (SALAGEN) 5 MG tablet Take 1 tablet (5 mg total) by mouth 3 (three) times daily.  90 tablet  5  . polyethylene glycol powder (GLYCOLAX/MIRALAX) powder Take 17 g by mouth daily.        . pregabalin (LYRICA) 75 MG capsule Take 1 capsule (75 mg total) by mouth daily.  90 capsule  6  . traZODone (DESYREL) 100 MG tablet Take 1 tablet (100 mg total) by mouth at bedtime.  90 tablet  6  . zolpidem (AMBIEN) 10 MG tablet Take 1 tablet (10 mg total) by mouth at bedtime as needed.  30 tablet  3    Review of Systems Review of Systems  Constitutional: Negative.   HENT: Negative.     Eyes: Negative.   Respiratory: Negative.   Cardiovascular: Negative.   Gastrointestinal: Positive for anal bleeding and rectal pain.  Genitourinary: Negative.   Musculoskeletal: Negative.   Neurological: Negative.   Hematological: Negative.     Blood pressure 170/80, pulse 92, temperature 97.6 F (36.4 C), temperature source Temporal, resp. rate 20, height 5\' 2"  (1.575 m), weight 199 lb 6.4 oz (90.447 kg).  Physical Exam Physical Exam  Constitutional: She is oriented to person, place, and time. She appears well-developed and well-nourished.  HENT:  Head: Normocephalic and atraumatic.  Eyes: EOM are normal. Pupils are equal, round, and reactive to light.  Neck: Normal range of motion.  Cardiovascular: Normal rate, regular rhythm and normal heart sounds.   Pulmonary/Chest: Effort normal. Abd:  Umbilical hernia reducible  Genitourinary:       Anterior anal fissure.  3 column internal and external hemorrhoid disease. Irritated.  Musculoskeletal: Normal range of motion.  Neurological: She is alert and oriented to person, place, and time.  Skin: Skin is warm and dry.  Psychiatric: She has a normal mood and affect. Her behavior is normal. Judgment and thought content normal.    Data Reviewed   Assessment    Anal fissure Internal and external grade 3 hemorrhoid disease. Umbilical hernia    Plan    She has failed medical management. Since she has failed medical management, I recommend a lateral internal sphincterotomy and hemorrhoidectomy to her. Risk of bleeding, infection, incontinence, further surgery, postoperative pain, postoperative bleeding, postoperative drainage, and the need for further surgery discussed. Alternative therapies and continue medical management versus office based procedure discussed. I do not think these will address her problem. She was locked proceed with surgical treatment.        Aaima Gaddie A. 01/06/2011, 4:57 PM

## 2011-01-28 ENCOUNTER — Ambulatory Visit (INDEPENDENT_AMBULATORY_CARE_PROVIDER_SITE_OTHER): Payer: PRIVATE HEALTH INSURANCE | Admitting: Internal Medicine

## 2011-01-28 ENCOUNTER — Encounter: Payer: Self-pay | Admitting: Internal Medicine

## 2011-01-28 DIAGNOSIS — E119 Type 2 diabetes mellitus without complications: Secondary | ICD-10-CM

## 2011-01-28 DIAGNOSIS — I10 Essential (primary) hypertension: Secondary | ICD-10-CM

## 2011-01-28 DIAGNOSIS — M1990 Unspecified osteoarthritis, unspecified site: Secondary | ICD-10-CM

## 2011-01-28 DIAGNOSIS — Z0000 Encounter for general adult medical examination without abnormal findings: Secondary | ICD-10-CM

## 2011-01-28 DIAGNOSIS — Z23 Encounter for immunization: Secondary | ICD-10-CM

## 2011-01-28 MED ORDER — GEMFIBROZIL 600 MG PO TABS: 600.0000 mg | ORAL_TABLET | Freq: Two times a day (BID) | ORAL | Status: DC

## 2011-01-28 MED ORDER — OMEPRAZOLE 20 MG PO CPDR: 20.0000 mg | DELAYED_RELEASE_CAPSULE | Freq: Every day | ORAL | Status: DC

## 2011-01-28 MED ORDER — ZOLPIDEM TARTRATE 10 MG PO TABS: 10.0000 mg | ORAL_TABLET | Freq: Every evening | ORAL | Status: DC | PRN

## 2011-01-28 MED ORDER — GLUCOSE BLOOD VI STRP: ORAL_STRIP | Status: DC

## 2011-01-28 MED ORDER — PREGABALIN 75 MG PO CAPS: 75.0000 mg | ORAL_CAPSULE | Freq: Every day | ORAL | Status: DC

## 2011-01-28 MED ORDER — ATENOLOL-CHLORTHALIDONE 50-25 MG PO TABS: 1.0000 | ORAL_TABLET | Freq: Every day | ORAL | Status: DC

## 2011-01-28 MED ORDER — TRAZODONE HCL 100 MG PO TABS: 100.0000 mg | ORAL_TABLET | Freq: Every day | ORAL | Status: DC

## 2011-01-28 MED ORDER — FUROSEMIDE 20 MG PO TABS: 20.0000 mg | ORAL_TABLET | Freq: Every day | ORAL | Status: DC

## 2011-01-28 MED ORDER — METFORMIN HCL 1000 MG PO TABS: 1000.0000 mg | ORAL_TABLET | Freq: Two times a day (BID) | ORAL | Status: DC

## 2011-01-28 MED ORDER — ALPRAZOLAM 0.5 MG PO TABS: 0.5000 mg | ORAL_TABLET | Freq: Every day | ORAL | Status: DC | PRN

## 2011-01-28 MED ORDER — CITALOPRAM HYDROBROMIDE 40 MG PO TABS: 40.0000 mg | ORAL_TABLET | Freq: Every day | ORAL | Status: DC

## 2011-01-28 MED ORDER — GLIMEPIRIDE 2 MG PO TABS: 1.0000 mg | ORAL_TABLET | Freq: Every day | ORAL | Status: DC

## 2011-01-28 NOTE — Patient Instructions (Signed)
Please check your hemoglobin A1c every 3 months  Limit your sodium (Salt) intake    It is important that you exercise regularly, at least 20 minutes 3 to 4 times per week.  If you develop chest pain or shortness of breath seek  medical attention.  You need to lose weight.  Consider a lower calorie diet and regular exercise. 

## 2011-01-28 NOTE — Progress Notes (Signed)
  Subjective:    Patient ID: Katie Booker, female    DOB: April 01, 1945, 65 y.o.   MRN: [id number]  HPI  Wt Readings from Last 3 Encounters:  01/28/11 187 lb (84.823 kg)  01/06/11 199 lb 6.4 oz (90.447 kg)  11/26/10 202 lb 6.4 oz (91.808 kg)      65 year old patient who is seen today for followup of her type 2 diabetes she is on metformin as well as Amaryl 1 mg daily blood sugars have trended up. There's been no weight loss. She is followed by Gen. surgery for hemorrhoidal disease and an anal fissure and is scheduled for elective surgery after the first of the year she has hypothyroidism osteoarthritis and anxiety. She is requesting to increase of Xanax. She uses this only once daily. Her last hemoglobin A1c 7.1 one year ago hemoglobin A1c was 6.5 and her Amaryl dose decreased to 1 mg daily due to  occasional symptomatic hypoglycemia  Review of Systems  Constitutional: Negative.   HENT: Negative for hearing loss, congestion, sore throat, rhinorrhea, dental problem, sinus pressure and tinnitus.   Eyes: Negative for pain, discharge and visual disturbance.  Respiratory: Negative for cough and shortness of breath.   Cardiovascular: Negative for chest pain, palpitations and leg swelling.  Gastrointestinal: Negative for nausea, vomiting, abdominal pain, diarrhea, constipation, blood in stool and abdominal distention.  Genitourinary: Negative for dysuria, urgency, frequency, hematuria, flank pain, vaginal bleeding, vaginal discharge, difficulty urinating, vaginal pain and pelvic pain.  Musculoskeletal: Negative for joint swelling, arthralgias and gait problem.  Skin: Negative for rash.  Neurological: Negative for dizziness, syncope, speech difficulty, weakness, numbness and headaches.  Hematological: Negative for adenopathy.  Psychiatric/Behavioral: Negative for behavioral problems, dysphoric mood and agitation. The patient is nervous/anxious.        Objective:   Physical Exam  Constitutional:  She is oriented to person, place, and time. She appears well-developed and well-nourished.       Weight 202 Blood pressure well controlled  HENT:  Head: Normocephalic.  Right Ear: External ear normal.  Left Ear: External ear normal.  Mouth/Throat: Oropharynx is clear and moist.  Eyes: Conjunctivae and EOM are normal. Pupils are equal, round, and reactive to light.  Neck: Normal range of motion. Neck supple. No thyromegaly present.  Cardiovascular: Normal rate, regular rhythm, normal heart sounds and intact distal pulses.   Pulmonary/Chest: Effort normal and breath sounds normal.  Abdominal: Soft. Bowel sounds are normal. She exhibits no mass. There is no tenderness.  Musculoskeletal: Normal range of motion.  Lymphadenopathy:    She has no cervical adenopathy.  Neurological: She is alert and oriented to person, place, and time.  Skin: Skin is warm and dry. No rash noted.  Psychiatric: She has a normal mood and affect. Her behavior is normal.          Assessment & Plan:   Diabetes mellitus. Nonpharmacologic measures discussed. We'll check a hemoglobin A1c. If this does increase further we will increase Amaryl Hypertension stable Osteoarthritis Hemorrhoidal disease. Per general surgery  All medications refilled Diet weight loss encouraged Recheck 3 months

## 2011-01-29 ENCOUNTER — Other Ambulatory Visit: Payer: Self-pay | Admitting: Internal Medicine

## 2011-01-29 MED ORDER — GLIMEPIRIDE 2 MG PO TABS: 2.0000 mg | ORAL_TABLET | Freq: Every day | ORAL | Status: DC

## 2011-01-29 NOTE — Progress Notes (Signed)
Quick Note:  Spoke with pt - informed of lab results- instructed to increase med - new rx to pharm ______

## 2011-02-08 ENCOUNTER — Other Ambulatory Visit: Payer: Self-pay | Admitting: Internal Medicine

## 2011-02-25 ENCOUNTER — Encounter (INDEPENDENT_AMBULATORY_CARE_PROVIDER_SITE_OTHER): Payer: Self-pay | Admitting: Surgery

## 2011-02-25 ENCOUNTER — Ambulatory Visit (INDEPENDENT_AMBULATORY_CARE_PROVIDER_SITE_OTHER): Payer: Medicare Other | Admitting: Surgery

## 2011-02-25 VITALS — BP 154/98 | HR 104 | Temp 96.2°F | Ht 62.0 in | Wt 205.2 lb

## 2011-02-25 DIAGNOSIS — K602 Anal fissure, unspecified: Secondary | ICD-10-CM

## 2011-02-25 NOTE — Progress Notes (Signed)
Chief Complaint  Patient presents with  . Follow-up    pre op before hems sx on1/14/2013    HPI Katie Booker is a 66 y.o. female.  HPI The patient presents at the request of Dr.Kwiatkowski due to rectal bleeding and pain. She has history of anal fissure. She also has a history of internal hemorrhoids. She's having more bleeding, burning, and pain at defecation. Over the last 4 weeks, the symptoms have worsened. She has tried topical ointments with no relief. She returns with no improvement in symptoms.  Past Medical History  Diagnosis Date  . ANEMIA-NOS 09/18/2009  . DEPRESSION 09/18/2009  . DIABETES MELLITUS, TYPE II 09/18/2009  . GERD 09/18/2009  . HYPERLIPIDEMIA 09/18/2009  . HYPERTENSION 09/18/2009  . HYPOTHYROIDISM 09/18/2009  . OSTEOARTHRITIS 09/18/2009  . Obesity   . OSA (obstructive sleep apnea)   . Cancer   . Hemorrhoids   . Anal fissure     Past Surgical History  Procedure Date  . Tubal fulgaration 1991  . Colonoscopy 2007  . Esophagogastroduodenoscopy 2007    Family History  Problem Relation Age of Onset  . Diabetes Father   . COPD Father   . Cancer Maternal Grandfather     stomach    Social History History  Substance Use Topics  . Smoking status: Former Smoker    Quit date: 02/23/2004  . Smokeless tobacco: Not on file   Comment: quit 2007  . Alcohol Use: Yes    Allergies  Allergen Reactions  . Codeine     REACTION: rash  . Fluticasone Propionate     REACTION: rash    Current Outpatient Prescriptions  Medication Sig Dispense Refill  . acetaminophen (TYLENOL ARTHRITIS PAIN) 650 MG CR tablet Take 650 mg by mouth every 8 (eight) hours as needed.        . ALPRAZolam (XANAX) 0.5 MG tablet Take 1 tablet (0.5 mg total) by mouth daily as needed.  60 tablet  3  . atenolol-chlorthalidone (TENORETIC) 50-25 MG per tablet Take 1 tablet by mouth daily.  90 tablet  3  . Calcium Carbonate-Vit D-Min (GNP CALCIUM 600 PLUS D/MINERAL) 600-400 MG-UNIT TABS Take 1  tablet by mouth 2 (two) times daily.        . citalopram (CELEXA) 40 MG tablet Take 1 tablet (40 mg total) by mouth daily.  90 tablet  6  . furosemide (LASIX) 20 MG tablet Take 1 tablet (20 mg total) by mouth daily.  90 tablet  6  . gemfibrozil (LOPID) 600 MG tablet Take 1 tablet (600 mg total) by mouth 2 (two) times daily before a meal.  180 tablet  6  . glimepiride (AMARYL) 2 MG tablet Take 1 tablet (2 mg total) by mouth daily before breakfast.  90 tablet  6  . glucose blood (ONE TOUCH ULTRA TEST) test strip Use as instructed  100 each  6  . Lancets (ONETOUCH ULTRASOFT) lancets 1 each by Other route daily. Use as instructed       . levothyroxine (SYNTHROID, LEVOTHROID) 150 MCG tablet Take 1 tablet (150 mcg total) by mouth daily.  90 tablet  6  . levothyroxine (SYNTHROID, LEVOTHROID) 150 MCG tablet TAKE 1 TABLET EVERY DAY  90 tablet  3  . metFORMIN (GLUCOPHAGE) 1000 MG tablet Take 1 tablet (1,000 mg total) by mouth 2 (two) times daily with a meal.  180 tablet  6  . Multiple Vitamin (MULTIVITAMIN) tablet Take 1 tablet by mouth daily.        Marland Kitchen  Multiple Vitamins-Minerals (EYE VITAMINS) TABS Take by mouth daily.        . naproxen sodium (ANAPROX) 550 MG tablet Take 1 tablet (550 mg total) by mouth 2 (two) times daily with a meal.  180 tablet  2  . nystatin-triamcinolone (MYCOLOG) ointment Apply topically 2 (two) times daily.  60 g  2  . omeprazole (PRILOSEC) 20 MG capsule Take 1 capsule (20 mg total) by mouth daily.  90 capsule  6  . polyethylene glycol powder (GLYCOLAX/MIRALAX) powder Take 17 g by mouth daily.        . pregabalin (LYRICA) 75 MG capsule Take 1 capsule (75 mg total) by mouth daily.  90 capsule  6  . traZODone (DESYREL) 100 MG tablet Take 1 tablet (100 mg total) by mouth at bedtime.  90 tablet  6  . zolpidem (AMBIEN) 10 MG tablet Take 1 tablet (10 mg total) by mouth at bedtime as needed.  30 tablet  3    Review of Systems Review of Systems  Constitutional: Negative.   HENT:  Negative.   Eyes: Negative.   Respiratory: Negative.   Cardiovascular: Negative.   Gastrointestinal: Positive for anal bleeding and rectal pain.  Genitourinary: Negative.   Musculoskeletal: Negative.   Neurological: Negative.   Hematological: Negative.     Blood pressure 154/98, pulse 104, temperature 96.2 F (35.7 C), temperature source Temporal, height 5\' 2"  (1.575 m), weight 205 lb 3.2 oz (93.078 kg), SpO2 92.00%.  Physical Exam Physical Exam  Constitutional: She is oriented to person, place, and time. She appears well-developed and well-nourished.  HENT:  Head: Normocephalic and atraumatic.  Eyes: EOM are normal. Pupils are equal, round, and reactive to light.  Neck: Normal range of motion.  Cardiovascular: Normal rate, regular rhythm and normal heart sounds.   Pulmonary/Chest: Effort normal. Abd:  Umbilical hernia reducible  Genitourinary:       Anterior anal fissure.  3 column internal and external hemorrhoid disease. Irritated.  Musculoskeletal: Normal range of motion.  Neurological: She is alert and oriented to person, place, and time.  Skin: Skin is warm and dry.  Psychiatric: She has a normal mood and affect. Her behavior is normal. Judgment and thought content normal.    Data Reviewed   Assessment    Anal fissure Internal and external grade 3 hemorrhoid disease. Umbilical hernia    Plan    She has failed medical management. Since she has failed medical management, I recommend a lateral internal sphincterotomy and hemorrhoidectomy to her. Risk of bleeding, infection, incontinence, further surgery, postoperative pain, postoperative bleeding, postoperative drainage, and the need for further surgery discussed. Alternative therapies and continue medical management versus office based procedure discussed. I do not think these will address her problem. She was locked proceed with surgical treatment.   I answered all questions and the patient is ready to proceed.        Shanise Balch A. 02/25/2011, 11:59 AM

## 2011-02-25 NOTE — Patient Instructions (Signed)
See you on the 14 th

## 2011-03-11 ENCOUNTER — Other Ambulatory Visit: Payer: Self-pay | Admitting: Internal Medicine

## 2011-03-11 MED ORDER — NAPROXEN SODIUM 550 MG PO TABS: 550.0000 mg | ORAL_TABLET | Freq: Two times a day (BID) | ORAL | Status: DC

## 2011-03-11 NOTE — Telephone Encounter (Signed)
Needs new rx for Naproxen 550 sent to this new pharmacy. Thanks.

## 2011-03-17 ENCOUNTER — Encounter (HOSPITAL_BASED_OUTPATIENT_CLINIC_OR_DEPARTMENT_OTHER): Payer: Self-pay | Admitting: *Deleted

## 2011-03-17 NOTE — Progress Notes (Signed)
Pt has cpap-but cannot use-has tried-told her she may have to stay [facility] overnight-bring all meds,overnight bag and cpap-daughter is nurse-will stay with her  To come in for labs,cxr,ekg

## 2011-03-18 ENCOUNTER — Encounter: Payer: Self-pay | Admitting: Internal Medicine

## 2011-03-18 ENCOUNTER — Ambulatory Visit (INDEPENDENT_AMBULATORY_CARE_PROVIDER_SITE_OTHER): Payer: Self-pay | Admitting: Internal Medicine

## 2011-03-18 VITALS — BP 130/80 | HR 110 | Temp 98.7°F | Wt 206.0 lb

## 2011-03-18 DIAGNOSIS — R350 Frequency of micturition: Secondary | ICD-10-CM

## 2011-03-18 DIAGNOSIS — E119 Type 2 diabetes mellitus without complications: Secondary | ICD-10-CM

## 2011-03-18 DIAGNOSIS — I10 Essential (primary) hypertension: Secondary | ICD-10-CM

## 2011-03-18 DIAGNOSIS — E039 Hypothyroidism, unspecified: Secondary | ICD-10-CM

## 2011-03-18 LAB — POCT URINALYSIS DIPSTICK
Bilirubin, UA: NEGATIVE
Blood, UA: NEGATIVE
Glucose, UA: NEGATIVE
Spec Grav, UA: 1.02

## 2011-03-18 MED ORDER — METFORMIN HCL 1000 MG PO TABS: 1000.0000 mg | ORAL_TABLET | Freq: Two times a day (BID) | ORAL | Status: DC

## 2011-03-18 MED ORDER — FUROSEMIDE 20 MG PO TABS: 20.0000 mg | ORAL_TABLET | Freq: Every day | ORAL | Status: DC

## 2011-03-18 MED ORDER — ATENOLOL-CHLORTHALIDONE 50-25 MG PO TABS: 1.0000 | ORAL_TABLET | Freq: Every day | ORAL | Status: DC

## 2011-03-18 MED ORDER — CITALOPRAM HYDROBROMIDE 40 MG PO TABS: 40.0000 mg | ORAL_TABLET | Freq: Every day | ORAL | Status: DC

## 2011-03-18 MED ORDER — GEMFIBROZIL 600 MG PO TABS: 600.0000 mg | ORAL_TABLET | Freq: Two times a day (BID) | ORAL | Status: DC

## 2011-03-18 MED ORDER — GLIMEPIRIDE 4 MG PO TABS: 4.0000 mg | ORAL_TABLET | Freq: Every day | ORAL | Status: DC

## 2011-03-18 MED ORDER — TOBRAMYCIN-DEXAMETHASONE 0.3-0.1 % OP SUSP: 1.0000 [drp] | OPHTHALMIC | Status: AC

## 2011-03-18 MED ORDER — OMEPRAZOLE 20 MG PO CPDR: 20.0000 mg | DELAYED_RELEASE_CAPSULE | Freq: Every day | ORAL | Status: DC

## 2011-03-18 MED ORDER — ZOLPIDEM TARTRATE 10 MG PO TABS: 10.0000 mg | ORAL_TABLET | Freq: Every evening | ORAL | Status: DC | PRN

## 2011-03-18 MED ORDER — ALPRAZOLAM 0.5 MG PO TABS: 0.5000 mg | ORAL_TABLET | Freq: Every day | ORAL | Status: DC | PRN

## 2011-03-18 MED ORDER — TRAZODONE HCL 100 MG PO TABS: 100.0000 mg | ORAL_TABLET | Freq: Every day | ORAL | Status: DC

## 2011-03-18 MED ORDER — ONETOUCH ULTRASOFT LANCETS MISC: Status: DC

## 2011-03-18 MED ORDER — POLYETHYLENE GLYCOL 3350 17 GM/SCOOP PO POWD: 17.0000 g | Freq: Every day | ORAL | Status: DC

## 2011-03-18 MED ORDER — LEVOTHYROXINE SODIUM 150 MCG PO TABS: 150.0000 ug | ORAL_TABLET | Freq: Every day | ORAL | Status: DC

## 2011-03-18 MED ORDER — GLUCOSE BLOOD VI STRP: ORAL_STRIP | Status: DC

## 2011-03-18 MED ORDER — NAPROXEN SODIUM 550 MG PO TABS: 550.0000 mg | ORAL_TABLET | Freq: Two times a day (BID) | ORAL | Status: DC

## 2011-03-18 MED ORDER — PREGABALIN 75 MG PO CAPS: 75.0000 mg | ORAL_CAPSULE | Freq: Every day | ORAL | Status: DC

## 2011-03-18 NOTE — Progress Notes (Signed)
  Subjective:    Patient ID: Katie Booker, female    DOB: 01/26/1946, 66 y.o.   MRN: [id number]  HPI   66 year old patient is seen today for followup. She has concerns about a UTI with some urinary frequency. Urinalysis was obtained and reviewed and revealed no evidence of infection. She has type 2 diabetes and a random blood sugar 240. She admits to consuming a fair amount of candy earlier today. Her chief complaint is bilateral pink eye the left eye affected greater than the right. Denies any visual disturbances but has had some irritation and some dried exudate in the morning. She is on Amaryl and metformin for diabetes her hemoglobin A1c was checked one month ago and had increased to 7.8. There is been a weight gain over the past month of 4 pounds. She is scheduled for Gen. Surgery next week for treatment of a fissure and hemorrhoidal disease. She requires medication  refills on all her current medications   Review of Systems  Constitutional: Positive for unexpected weight change.  HENT: Negative for hearing loss, congestion, sore throat, rhinorrhea, dental problem, sinus pressure and tinnitus.   Eyes: Positive for discharge, redness and itching. Negative for pain and visual disturbance.  Respiratory: Negative for cough and shortness of breath.   Cardiovascular: Negative for chest pain, palpitations and leg swelling.  Gastrointestinal: Negative for nausea, vomiting, abdominal pain, diarrhea, constipation, blood in stool and abdominal distention.  Genitourinary: Positive for frequency. Negative for dysuria, urgency, hematuria, flank pain, vaginal bleeding, vaginal discharge, difficulty urinating, vaginal pain and pelvic pain.  Musculoskeletal: Negative for joint swelling, arthralgias and gait problem.  Skin: Negative for rash.  Neurological: Negative for dizziness, syncope, speech difficulty, weakness, numbness and headaches.  Hematological: Negative for adenopathy.  Psychiatric/Behavioral:  Negative for behavioral problems, dysphoric mood and agitation. The patient is not nervous/anxious.        Objective:   Physical Exam  Constitutional: She is oriented to person, place, and time. She appears well-developed and well-nourished.       Obese. Normal blood pressure. Afebrile blood sugar 240  HENT:  Head: Normocephalic.  Right Ear: External ear normal.  Left Ear: External ear normal.  Mouth/Throat: Oropharynx is clear and moist.  Eyes: Conjunctivae and EOM are normal. Pupils are equal, round, and reactive to light.       Mild conjunctival injection left greater than the right  Neck: Normal range of motion. Neck supple. No thyromegaly present.  Cardiovascular: Normal rate, regular rhythm, normal heart sounds and intact distal pulses.   Pulmonary/Chest: Effort normal and breath sounds normal.  Abdominal: Soft. Bowel sounds are normal. She exhibits no mass. There is no tenderness.  Musculoskeletal: Normal range of motion.  Lymphadenopathy:    She has no cervical adenopathy.  Neurological: She is alert and oriented to person, place, and time.  Skin: Skin is warm and dry. No rash noted.  Psychiatric: She has a normal mood and affect. Her behavior is normal.          Assessment & Plan:   Conjunctivitis. We'll treat with TobraDex  diabetes mellitus poor control we'll increase the Amaryl to 4 mg daily and continue full dose metformin. Lifestyle issues discussed for recheck in 2 months and check a hemoglobin A1c at that time  hypertension stable  obesity   all medications are printed  recheck 2 months

## 2011-03-18 NOTE — Patient Instructions (Signed)
Please check your hemoglobin A1c every 3 months  You need to lose weight.  Consider a lower calorie diet and regular exercise.    It is important that you exercise regularly, at least 20 minutes 3 to 4 times per week.  If you develop chest pain or shortness of breath seek  medical attention. 

## 2011-03-22 ENCOUNTER — Ambulatory Visit (HOSPITAL_COMMUNITY): Admission: RE | Admit: 2011-03-22 | Payer: Medicare Other | Source: Ambulatory Visit

## 2011-03-22 ENCOUNTER — Encounter (HOSPITAL_BASED_OUTPATIENT_CLINIC_OR_DEPARTMENT_OTHER)
Admission: RE | Admit: 2011-03-22 | Discharge: 2011-03-22 | Disposition: A | Discharge status: Home / Self Care | Payer: Medicare Other | Source: Ambulatory Visit | Attending: Surgery | Admitting: Surgery

## 2011-03-22 LAB — CBC
HCT: 34.5 % — ABNORMAL LOW (ref 36.0–46.0)
Hemoglobin: 10.9 g/dL — ABNORMAL LOW (ref 12.0–15.0)
MCV: 73.4 fL — ABNORMAL LOW (ref 78.0–100.0)
RBC: 4.7 MIL/uL (ref 3.87–5.11)
WBC: 8.5 10*3/uL (ref 4.0–10.5)

## 2011-03-22 LAB — COMPREHENSIVE METABOLIC PANEL
AST: 24 U/L (ref 0–37)
BUN: 22 mg/dL (ref 6–23)
CO2: 31 mEq/L (ref 19–32)
Chloride: 94 mEq/L — ABNORMAL LOW (ref 96–112)
Creatinine, Ser: 0.8 mg/dL (ref 0.50–1.10)
GFR calc non Af Amer: 76 mL/min — ABNORMAL LOW (ref >90)
Total Bilirubin: 0.2 mg/dL — ABNORMAL LOW (ref 0.3–1.2)

## 2011-03-22 NOTE — Progress Notes (Signed)
Dr Luisa Hart called with lab results no change in orders

## 2011-03-23 ENCOUNTER — Encounter (HOSPITAL_BASED_OUTPATIENT_CLINIC_OR_DEPARTMENT_OTHER): Payer: Self-pay | Admitting: Anesthesiology

## 2011-03-23 ENCOUNTER — Encounter (HOSPITAL_BASED_OUTPATIENT_CLINIC_OR_DEPARTMENT_OTHER): Payer: Self-pay | Admitting: Surgery

## 2011-03-23 ENCOUNTER — Encounter (HOSPITAL_BASED_OUTPATIENT_CLINIC_OR_DEPARTMENT_OTHER)
Admission: RE | Disposition: A | Discharge status: Home / Self Care | Payer: Self-pay | Source: Ambulatory Visit | Attending: Surgery

## 2011-03-23 ENCOUNTER — Encounter (HOSPITAL_BASED_OUTPATIENT_CLINIC_OR_DEPARTMENT_OTHER): Payer: Self-pay | Admitting: *Deleted

## 2011-03-23 ENCOUNTER — Ambulatory Visit (HOSPITAL_BASED_OUTPATIENT_CLINIC_OR_DEPARTMENT_OTHER): Payer: Medicare Other | Admitting: Anesthesiology

## 2011-03-23 ENCOUNTER — Other Ambulatory Visit (INDEPENDENT_AMBULATORY_CARE_PROVIDER_SITE_OTHER): Payer: Self-pay | Admitting: Surgery

## 2011-03-23 ENCOUNTER — Ambulatory Visit (HOSPITAL_BASED_OUTPATIENT_CLINIC_OR_DEPARTMENT_OTHER)
Admission: RE | Admit: 2011-03-23 | Discharge: 2011-03-23 | Disposition: A | Discharge status: Home / Self Care | Payer: Medicare Other | Source: Ambulatory Visit | Attending: Surgery | Admitting: Surgery

## 2011-03-23 DIAGNOSIS — E039 Hypothyroidism, unspecified: Secondary | ICD-10-CM

## 2011-03-23 DIAGNOSIS — K644 Residual hemorrhoidal skin tags: Secondary | ICD-10-CM | POA: Insufficient documentation

## 2011-03-23 DIAGNOSIS — K602 Anal fissure, unspecified: Secondary | ICD-10-CM | POA: Insufficient documentation

## 2011-03-23 DIAGNOSIS — F329 Major depressive disorder, single episode, unspecified: Secondary | ICD-10-CM

## 2011-03-23 DIAGNOSIS — K648 Other hemorrhoids: Secondary | ICD-10-CM

## 2011-03-23 DIAGNOSIS — M1990 Unspecified osteoarthritis, unspecified site: Secondary | ICD-10-CM

## 2011-03-23 DIAGNOSIS — E669 Obesity, unspecified: Secondary | ICD-10-CM | POA: Insufficient documentation

## 2011-03-23 DIAGNOSIS — E785 Hyperlipidemia, unspecified: Secondary | ICD-10-CM

## 2011-03-23 DIAGNOSIS — D649 Anemia, unspecified: Secondary | ICD-10-CM

## 2011-03-23 DIAGNOSIS — F3289 Other specified depressive episodes: Secondary | ICD-10-CM

## 2011-03-23 DIAGNOSIS — E119 Type 2 diabetes mellitus without complications: Secondary | ICD-10-CM

## 2011-03-23 DIAGNOSIS — K219 Gastro-esophageal reflux disease without esophagitis: Secondary | ICD-10-CM

## 2011-03-23 DIAGNOSIS — H32 Chorioretinal disorders in diseases classified elsewhere: Secondary | ICD-10-CM

## 2011-03-23 DIAGNOSIS — I10 Essential (primary) hypertension: Secondary | ICD-10-CM

## 2011-03-23 DIAGNOSIS — B399 Histoplasmosis, unspecified: Secondary | ICD-10-CM

## 2011-03-23 HISTORY — PX: HEMORRHOID SURGERY: SHX153

## 2011-03-23 LAB — GLUCOSE, CAPILLARY: Glucose-Capillary: 111 mg/dL — ABNORMAL HIGH (ref 70–99)

## 2011-03-23 LAB — POCT HEMOGLOBIN-HEMACUE: Hemoglobin: 11.9 g/dL — ABNORMAL LOW (ref 12.0–15.0)

## 2011-03-23 SURGERY — HEMORRHOIDECTOMY
Anesthesia: General | Site: Rectum | Wound class: Clean Contaminated

## 2011-03-23 MED ORDER — DIBUCAINE 1 % RE OINT
TOPICAL_OINTMENT | RECTAL | Status: DC | PRN
  Administered 2011-03-23: 1 via RECTAL

## 2011-03-23 MED ORDER — LACTATED RINGERS IV SOLN
INTRAVENOUS | Status: DC
  Administered 2011-03-23 (×2): via INTRAVENOUS

## 2011-03-23 MED ORDER — BUPIVACAINE-EPINEPHRINE 0.25% -1:200000 IJ SOLN
INTRAMUSCULAR | Status: DC | PRN
  Administered 2011-03-23: 30 mL

## 2011-03-23 MED ORDER — ONDANSETRON HCL 4 MG/2ML IJ SOLN
4.0000 mg | Freq: Four times a day (QID) | INTRAMUSCULAR | Status: AC | PRN
  Administered 2011-03-23: 4 mg via INTRAVENOUS

## 2011-03-23 MED ORDER — LIDOCAINE HCL (CARDIAC) 20 MG/ML IV SOLN
INTRAVENOUS | Status: DC | PRN
  Administered 2011-03-23: 80 mg via INTRAVENOUS

## 2011-03-23 MED ORDER — PROPOFOL 10 MG/ML IV EMUL
INTRAVENOUS | Status: DC | PRN
  Administered 2011-03-23: 180 mg via INTRAVENOUS

## 2011-03-23 MED ORDER — HYDROMORPHONE HCL PF 1 MG/ML IJ SOLN: 0.2500 mg | INTRAMUSCULAR | Status: DC | PRN

## 2011-03-23 MED ORDER — HYDROMORPHONE HCL 2 MG PO TABS: 2.0000 mg | ORAL_TABLET | Freq: Two times a day (BID) | ORAL | Status: DC | PRN

## 2011-03-23 MED ORDER — MIDAZOLAM HCL 5 MG/5ML IJ SOLN
INTRAMUSCULAR | Status: DC | PRN
  Administered 2011-03-23: 2 mg via INTRAVENOUS

## 2011-03-23 MED ORDER — FENTANYL CITRATE 0.05 MG/ML IJ SOLN
INTRAMUSCULAR | Status: DC | PRN
  Administered 2011-03-23 (×2): 50 ug via INTRAVENOUS

## 2011-03-23 MED ORDER — CEFAZOLIN SODIUM-DEXTROSE 2-3 GM-% IV SOLR
2.0000 g | INTRAVENOUS | Status: AC
  Administered 2011-03-23: 2 g via INTRAVENOUS

## 2011-03-23 MED ORDER — METOCLOPRAMIDE HCL 5 MG/ML IJ SOLN
INTRAMUSCULAR | Status: DC | PRN
  Administered 2011-03-23: 10 mg via INTRAVENOUS

## 2011-03-23 MED ORDER — ONDANSETRON HCL 4 MG/2ML IJ SOLN
INTRAMUSCULAR | Status: DC | PRN
  Administered 2011-03-23: 4 mg via INTRAVENOUS

## 2011-03-23 MED ORDER — LIDOCAINE 5 % EX OINT: TOPICAL_OINTMENT | CUTANEOUS | Status: DC | PRN

## 2011-03-23 SURGICAL SUPPLY — 46 items
BENZOIN TINCTURE PRP APPL 2/3 (GAUZE/BANDAGES/DRESSINGS) IMPLANT
BLADE SURG 11 STRL SS (BLADE) ×2 IMPLANT
BLADE SURG 15 STRL LF DISP TIS (BLADE) ×1 IMPLANT
BLADE SURG 15 STRL SS (BLADE) ×1
BRIEF STRETCH FOR OB PAD LRG (UNDERPADS AND DIAPERS) ×2 IMPLANT
CANISTER SUCTION 1200CC (MISCELLANEOUS) ×2 IMPLANT
CLOTH BEACON ORANGE TIMEOUT ST (SAFETY) ×2 IMPLANT
COVER MAYO STAND STRL (DRAPES) IMPLANT
COVER TABLE BACK 60X90 (DRAPES) IMPLANT
DECANTER SPIKE VIAL GLASS SM (MISCELLANEOUS) IMPLANT
DRAPE PED LAPAROTOMY (DRAPES) IMPLANT
DRAPE UTILITY XL STRL (DRAPES) ×2 IMPLANT
DRSG PAD ABDOMINAL 8X10 ST (GAUZE/BANDAGES/DRESSINGS) ×2 IMPLANT
ELECT COATED BLADE 2.86 ST (ELECTRODE) ×2 IMPLANT
ELECT REM PT RETURN 9FT ADLT (ELECTROSURGICAL) ×2
ELECTRODE REM PT RTRN 9FT ADLT (ELECTROSURGICAL) ×1 IMPLANT
GAUZE SPONGE 4X4 12PLY STRL LF (GAUZE/BANDAGES/DRESSINGS) IMPLANT
GLOVE BIO SURGEON STRL SZ7 (GLOVE) ×6 IMPLANT
GLOVE BIOGEL PI IND STRL 7.0 (GLOVE) ×1 IMPLANT
GLOVE BIOGEL PI IND STRL 8 (GLOVE) ×1 IMPLANT
GLOVE BIOGEL PI INDICATOR 7.0 (GLOVE) ×1
GLOVE BIOGEL PI INDICATOR 8 (GLOVE) ×1
GLOVE ECLIPSE 8.0 STRL XLNG CF (GLOVE) ×2 IMPLANT
GOWN PREVENTION PLUS XLARGE (GOWN DISPOSABLE) ×6 IMPLANT
HEMOSTAT SURGICEL 2X14 (HEMOSTASIS) ×2 IMPLANT
NEEDLE HYPO 25X1 1.5 SAFETY (NEEDLE) ×2 IMPLANT
PACK BASIN DAY SURGERY FS (CUSTOM PROCEDURE TRAY) ×2 IMPLANT
PACK LITHOTOMY IV (CUSTOM PROCEDURE TRAY) ×2 IMPLANT
PENCIL BUTTON HOLSTER BLD 10FT (ELECTRODE) ×2 IMPLANT
SHEARS HARMONIC 9CM CVD (BLADE) ×2 IMPLANT
SLEEVE SCD COMPRESS KNEE MED (MISCELLANEOUS) ×2 IMPLANT
SPONGE GAUZE 4X4 12PLY (GAUZE/BANDAGES/DRESSINGS) ×2 IMPLANT
SPONGE SURGIFOAM ABS GEL 100 (HEMOSTASIS) ×2 IMPLANT
SURGILUBE 2OZ TUBE FLIPTOP (MISCELLANEOUS) ×2 IMPLANT
SUT MON AB 3-0 SH 27 (SUTURE) ×3
SUT MON AB 3-0 SH27 (SUTURE) ×3 IMPLANT
SYR CONTROL 10ML LL (SYRINGE) ×2 IMPLANT
TAPE CLOTH 3X10 TAN LF (GAUZE/BANDAGES/DRESSINGS) IMPLANT
TAPE CLOTH SURG 6X10 WHT LF (GAUZE/BANDAGES/DRESSINGS) ×2 IMPLANT
TOWEL OR 17X24 6PK STRL BLUE (TOWEL DISPOSABLE) ×2 IMPLANT
TOWEL OR NON WOVEN STRL DISP B (DISPOSABLE) ×2 IMPLANT
TRAY DSU PREP LF (CUSTOM PROCEDURE TRAY) ×2 IMPLANT
TUBE CONNECTING 20X1/4 (TUBING) ×2 IMPLANT
UNDERPAD 30X30 INCONTINENT (UNDERPADS AND DIAPERS) ×2 IMPLANT
WATER STERILE IRR 1000ML POUR (IV SOLUTION) ×2 IMPLANT
YANKAUER SUCT BULB TIP NO VENT (SUCTIONS) ×2 IMPLANT

## 2011-03-23 NOTE — Transfer of Care (Signed)
Immediate Anesthesia Transfer of Care Note  Patient: Katie Booker  Procedure(s) Performed:  HEMORRHOIDECTOMY - lateral internal sphincterotomy and hemorrhoidectomy  Patient Location: PACU  Anesthesia Type: General  Level of Consciousness: sedated  Airway & Oxygen Therapy: Patient Spontanous Breathing and Patient connected to face mask oxygen  Post-op Assessment: Report given to PACU RN and Post -op Vital signs reviewed and stable  Post vital signs: Reviewed and stable  Complications: No apparent anesthesia complications

## 2011-03-23 NOTE — Op Note (Signed)
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  Anesthesia: LMA with 0.25% Sensorcaine with epinephrine for perianal block  EBL: 50 cc  Drains: None  Specimens: Hemorrhoid tissue to pathology  IV fluids: Thousand cc crystalloid  Indications for procedure: The patient is a 65 year old female with a long-standing history of hemorrhoid disease. She has had symptoms of bleeding, drainage, itching, and pressure was severe pain after defecation. She's tried medical management for this problem and has failed. After lengthy discussion of other treatment options to include further medical treatment, office-based treatment or surgery she elected to proceed with hemorrhoidectomy and lateral internal sphincterotomy for the chronic anal fissure. Risks include bleeding, infection, failure or surgical therapy, fecal incontinence,  and the need for further procedures. She agrees to proceed.  Description of procedure: The patient was met in the holding area and questions are answered. She was taken back to the operating room and placed supine. After induction of general anesthesia, she was placed in stirrups and appropriately padded. The perianal region was prepped and draped in a sterile fashion. A timeout was done there she received 1 g of Ancef. Digital examination revealed 3 column grade 3 to grade 4 prolapsed internal and actual hemorrhoids with a chronic posterior midline anal fissure. Endoscope was placed in the right lateral position was chosen for sphincterotomy. A #11 scalpel blade was used and the intersphincteric groove was identified with my finger. We slid the scalpel into the intersphincteric groove during the sharp edge toward the hypertrophied internal sphincter and divided it. The  external sphincter was preserved.  The right lateral prolapsed hemorrhoid complex was excised with harmonic scalpel and the mucosa was approximated with 3-0 Monocryl. The left lateral complex was excised with harmonic scalpel with the mucosa closed with 3-0 Monocryl. There is a very small right anterior column excised with the harmonic scalpel and closed with 3-0 Monocryl. The anal canal and distal rectum were widely patent with minimal narrowing. Hemostasis was excellent. Perianal block of 0.25% Sensorcaine was used. Packing consisting of Surgicel wrapped around Gelfoam was used with dupicaine ointment. All final counts the sponge, needle in a stretcher found to be correct. The patient was taken to lithotomy awoke taken to recovery in satisfactory condition.

## 2011-03-23 NOTE — Anesthesia Postprocedure Evaluation (Signed)
Anesthesia Post Note  Patient: Katie Booker  Procedure(s) Performed:  HEMORRHOIDECTOMY - lateral internal sphincterotomy and hemorrhoidectomy  Anesthesia type: General  Patient location: PACU  Post pain: Pain level controlled and Adequate analgesia  Post assessment: Post-op Vital signs reviewed, Patient's Cardiovascular Status Stable, Respiratory Function Stable, Patent Airway and Pain level controlled  Last Vitals:  Filed Vitals:   03/23/11 0841  BP: 131/73  Pulse: 84  Temp: 36.4 C  Resp: 14    Post vital signs: Reviewed and stable  Level of consciousness: awake, alert  and oriented  Complications: No apparent anesthesia complications

## 2011-03-23 NOTE — Anesthesia Procedure Notes (Signed)
Procedure Name: LMA Insertion Date/Time: 03/23/2011 7:48 AM Performed by: Signa Kell Pre-anesthesia Checklist: Patient identified, Emergency Drugs available, Suction available and Patient being monitored Patient Re-evaluated:Patient Re-evaluated prior to inductionOxygen Delivery Method: Circle System Utilized Preoxygenation: Pre-oxygenation with 100% oxygen Intubation Type: IV induction Ventilation: Mask ventilation without difficulty LMA: LMA with gastric port inserted LMA Size: 4.0 Number of attempts: 1 Placement Confirmation: positive ETCO2 Tube secured with: Tape Dental Injury: Teeth and Oropharynx as per pre-operative assessment

## 2011-03-23 NOTE — Interval H&P Note (Signed)
History and Physical Interval Note:  03/23/2011 7:27 AM  Katie Booker  has presented today for surgery, with the diagnosis of anal fissure and hemorrhoids  The various methods of treatment have been discussed with the patient and family. After consideration of risks, benefits and other options for treatment, the patient has consented to  Procedure(s): HEMORRHOIDECTOMY as a surgical intervention .  The patients' history has been reviewed, patient examined, no change in status, stable for surgery.  I have reviewed the patients' chart and labs.  Questions were answered to the patient's satisfaction.     Nayeliz Hipp A.

## 2011-03-23 NOTE — Anesthesia Preprocedure Evaluation (Signed)
Anesthesia Evaluation  Patient identified by MRN, date of birth, ID band Patient awake    Reviewed: Allergy & Precautions, H&P , NPO status , Patient's Chart, lab work & pertinent test results  Airway Mallampati: II  Neck ROM: full    Dental   Pulmonary sleep apnea ,          Cardiovascular hypertension,     Neuro/Psych PSYCHIATRIC DISORDERS Depression    GI/Hepatic GERD-  ,  Endo/Other  Diabetes mellitus-, Type 2Hypothyroidism   Renal/GU      Musculoskeletal   Abdominal   Peds  Hematology   Anesthesia Other Findings   Reproductive/Obstetrics                           Anesthesia Physical Anesthesia Plan  ASA: III  Anesthesia Plan: General   Post-op Pain Management:    Induction: Intravenous  Airway Management Planned:   Additional Equipment:   Intra-op Plan:   Post-operative Plan:   Informed Consent: I have reviewed the patients History and Physical, chart, labs and discussed the procedure including the risks, benefits and alternatives for the proposed anesthesia with the patient or authorized representative who has indicated his/her understanding and acceptance.     Plan Discussed with: CRNA and Surgeon  Anesthesia Plan Comments:         Anesthesia Quick Evaluation

## 2011-03-23 NOTE — H&P (View-Only) (Signed)
Chief Complaint  Patient presents with  . Follow-up    pre op before hems sx on1/14/2013    HPI Katie Booker is a 66 y.o. female.  HPI The patient presents at the request of Dr.Kwiatkowski due to rectal bleeding and pain. She has history of anal fissure. She also has a history of internal hemorrhoids. She's having more bleeding, burning, and pain at defecation. Over the last 4 weeks, the symptoms have worsened. She has tried topical ointments with no relief. She returns with no improvement in symptoms.  Past Medical History  Diagnosis Date  . ANEMIA-NOS 09/18/2009  . DEPRESSION 09/18/2009  . DIABETES MELLITUS, TYPE II 09/18/2009  . GERD 09/18/2009  . HYPERLIPIDEMIA 09/18/2009  . HYPERTENSION 09/18/2009  . HYPOTHYROIDISM 09/18/2009  . OSTEOARTHRITIS 09/18/2009  . Obesity   . OSA (obstructive sleep apnea)   . Cancer   . Hemorrhoids   . Anal fissure     Past Surgical History  Procedure Date  . Tubal fulgaration 1991  . Colonoscopy 2007  . Esophagogastroduodenoscopy 2007    Family History  Problem Relation Age of Onset  . Diabetes Father   . COPD Father   . Cancer Maternal Grandfather     stomach    Social History History  Substance Use Topics  . Smoking status: Former Smoker    Quit date: 02/23/2004  . Smokeless tobacco: Not on file   Comment: quit 2007  . Alcohol Use: Yes    Allergies  Allergen Reactions  . Codeine     REACTION: rash  . Fluticasone Propionate     REACTION: rash    Current Outpatient Prescriptions  Medication Sig Dispense Refill  . acetaminophen (TYLENOL ARTHRITIS PAIN) 650 MG CR tablet Take 650 mg by mouth every 8 (eight) hours as needed.        . ALPRAZolam (XANAX) 0.5 MG tablet Take 1 tablet (0.5 mg total) by mouth daily as needed.  60 tablet  3  . atenolol-chlorthalidone (TENORETIC) 50-25 MG per tablet Take 1 tablet by mouth daily.  90 tablet  3  . Calcium Carbonate-Vit D-Min (GNP CALCIUM 600 PLUS D/MINERAL) 600-400 MG-UNIT TABS Take 1  tablet by mouth 2 (two) times daily.        . citalopram (CELEXA) 40 MG tablet Take 1 tablet (40 mg total) by mouth daily.  90 tablet  6  . furosemide (LASIX) 20 MG tablet Take 1 tablet (20 mg total) by mouth daily.  90 tablet  6  . gemfibrozil (LOPID) 600 MG tablet Take 1 tablet (600 mg total) by mouth 2 (two) times daily before a meal.  180 tablet  6  . glimepiride (AMARYL) 2 MG tablet Take 1 tablet (2 mg total) by mouth daily before breakfast.  90 tablet  6  . glucose blood (ONE TOUCH ULTRA TEST) test strip Use as instructed  100 each  6  . Lancets (ONETOUCH ULTRASOFT) lancets 1 each by Other route daily. Use as instructed       . levothyroxine (SYNTHROID, LEVOTHROID) 150 MCG tablet Take 1 tablet (150 mcg total) by mouth daily.  90 tablet  6  . levothyroxine (SYNTHROID, LEVOTHROID) 150 MCG tablet TAKE 1 TABLET EVERY DAY  90 tablet  3  . metFORMIN (GLUCOPHAGE) 1000 MG tablet Take 1 tablet (1,000 mg total) by mouth 2 (two) times daily with a meal.  180 tablet  6  . Multiple Vitamin (MULTIVITAMIN) tablet Take 1 tablet by mouth daily.        .   Multiple Vitamins-Minerals (EYE VITAMINS) TABS Take by mouth daily.        . naproxen sodium (ANAPROX) 550 MG tablet Take 1 tablet (550 mg total) by mouth 2 (two) times daily with a meal.  180 tablet  2  . nystatin-triamcinolone (MYCOLOG) ointment Apply topically 2 (two) times daily.  60 g  2  . omeprazole (PRILOSEC) 20 MG capsule Take 1 capsule (20 mg total) by mouth daily.  90 capsule  6  . polyethylene glycol powder (GLYCOLAX/MIRALAX) powder Take 17 g by mouth daily.        . pregabalin (LYRICA) 75 MG capsule Take 1 capsule (75 mg total) by mouth daily.  90 capsule  6  . traZODone (DESYREL) 100 MG tablet Take 1 tablet (100 mg total) by mouth at bedtime.  90 tablet  6  . zolpidem (AMBIEN) 10 MG tablet Take 1 tablet (10 mg total) by mouth at bedtime as needed.  30 tablet  3    Review of Systems Review of Systems  Constitutional: Negative.   HENT:  Negative.   Eyes: Negative.   Respiratory: Negative.   Cardiovascular: Negative.   Gastrointestinal: Positive for anal bleeding and rectal pain.  Genitourinary: Negative.   Musculoskeletal: Negative.   Neurological: Negative.   Hematological: Negative.     Blood pressure 154/98, pulse 104, temperature 96.2 F (35.7 C), temperature source Temporal, height 5' 2" (1.575 m), weight 205 lb 3.2 oz (93.078 kg), SpO2 92.00%.  Physical Exam Physical Exam  Constitutional: She is oriented to person, place, and time. She appears well-developed and well-nourished.  HENT:  Head: Normocephalic and atraumatic.  Eyes: EOM are normal. Pupils are equal, round, and reactive to light.  Neck: Normal range of motion.  Cardiovascular: Normal rate, regular rhythm and normal heart sounds.   Pulmonary/Chest: Effort normal. Abd:  Umbilical hernia reducible  Genitourinary:       Anterior anal fissure.  3 column internal and external hemorrhoid disease. Irritated.  Musculoskeletal: Normal range of motion.  Neurological: She is alert and oriented to person, place, and time.  Skin: Skin is warm and dry.  Psychiatric: She has a normal mood and affect. Her behavior is normal. Judgment and thought content normal.    Data Reviewed   Assessment    Anal fissure Internal and external grade 3 hemorrhoid disease. Umbilical hernia    Plan    She has failed medical management. Since she has failed medical management, I recommend a lateral internal sphincterotomy and hemorrhoidectomy to her. Risk of bleeding, infection, incontinence, further surgery, postoperative pain, postoperative bleeding, postoperative drainage, and the need for further surgery discussed. Alternative therapies and continue medical management versus office based procedure discussed. I do not think these will address her problem. She was locked proceed with surgical treatment.   I answered all questions and the patient is ready to proceed.        [name] A. 02/25/2011, 11:59 AM    

## 2011-03-24 ENCOUNTER — Encounter (HOSPITAL_BASED_OUTPATIENT_CLINIC_OR_DEPARTMENT_OTHER): Payer: Self-pay | Admitting: Surgery

## 2011-03-25 ENCOUNTER — Telehealth (INDEPENDENT_AMBULATORY_CARE_PROVIDER_SITE_OTHER): Payer: Self-pay

## 2011-03-25 NOTE — Telephone Encounter (Signed)
Patient called because she is postop and can't sleep.  She is taking Dilaudid for pain.  She typically takes Ambien and Trazodone for sleep but was told by the hospital to stop it.  I asked her why but she is unsure.  I advised her to call her pharmacist and make sure there are no interactions with the 3 meds and if not she can resume them.

## 2011-03-26 ENCOUNTER — Telehealth (INDEPENDENT_AMBULATORY_CARE_PROVIDER_SITE_OTHER): Payer: Self-pay | Admitting: Surgery

## 2011-03-26 NOTE — Telephone Encounter (Signed)
I RECEIVED A PT CALL MESSAGE IN IN-BASKET RE F/U APPT/ I CONTACTED PT AND SHE SAID THE DILAUDID WAS TOO STRONG AND SLAO SHE WAS NOT SUPPOSE TO TAKE HER AMBIEN AND TRAZADONE WHILE TAKING DILAUDID/ I MADE HER F/U APPT WITH DR. Luisa Hart AND CALLED IN STANDARD POST-OP PAIN MED/ HYDROCODONE 5/325 #30 TO [facility]/ [phone]/GY/PT AWARE

## 2011-04-01 ENCOUNTER — Telehealth (INDEPENDENT_AMBULATORY_CARE_PROVIDER_SITE_OTHER): Payer: Self-pay

## 2011-04-01 NOTE — Telephone Encounter (Signed)
Pt is s/p hemorrhoidectomy.  She is calling for a refill on her Percocet.  Please call when approved.

## 2011-04-02 ENCOUNTER — Telehealth (INDEPENDENT_AMBULATORY_CARE_PROVIDER_SITE_OTHER): Payer: Self-pay | Admitting: General Surgery

## 2011-04-02 MED ORDER — OXYCODONE-ACETAMINOPHEN 5-325 MG PO TABS: 1.0000 | ORAL_TABLET | ORAL | Status: DC | PRN

## 2011-04-02 NOTE — Telephone Encounter (Signed)
Per Dr Jamey Ripa okay to write percocet rx. Written and at front for patient pick up.

## 2011-04-05 ENCOUNTER — Ambulatory Visit (INDEPENDENT_AMBULATORY_CARE_PROVIDER_SITE_OTHER): Payer: Medicare Other | Admitting: Surgery

## 2011-04-05 ENCOUNTER — Encounter (INDEPENDENT_AMBULATORY_CARE_PROVIDER_SITE_OTHER): Payer: Self-pay | Admitting: Surgery

## 2011-04-05 VITALS — BP 150/76 | HR 88 | Temp 98.2°F | Resp 14 | Ht 62.0 in | Wt 197.4 lb

## 2011-04-05 DIAGNOSIS — G8918 Other acute postprocedural pain: Secondary | ICD-10-CM

## 2011-04-05 NOTE — Patient Instructions (Signed)
SOAK MORE.  FLOMAX OR URINE DAILY.  PERCOCET FOR PAIN.RETURN IN 10 TO 14 DAYS.

## 2011-04-05 NOTE — Progress Notes (Signed)
The patient returns after hemorrhoidectomy 2 weeks ago. She is having some drainage and bleeding. Pain is still an issue. No fever or chills.  Exam: Anal canal shows postop change and swelling. There is significant fibrinous exudate. There is no redness or fluctuance. Wounds are open and intact  Impression status post 3 column hemorrhoidectomy and lateral internal sphincterotomy for hemorrhoid disease and chronic anal fissure  Plan: She needs to soak more gentle curve out today. I've asked her to soak 3 times a day for 30 minutes each. Return to clinic 10 days. I will place her on Flomax 0.4 mg by mouth daily for 10 days since she has some difficulty starting her urine stream. I have refilled her Percocet.

## 2011-04-12 ENCOUNTER — Telehealth (INDEPENDENT_AMBULATORY_CARE_PROVIDER_SITE_OTHER): Payer: Self-pay

## 2011-04-12 NOTE — Telephone Encounter (Signed)
The patient called requesting hydrocodone for pain med.  Ok per Dr Luisa Hart to call in protocol.  I called in to [facility] [phone] Hydrocodone 5/325 one tab po q4-6prn pain #30 no refills generic allowed.  Pt aware.

## 2011-04-19 ENCOUNTER — Encounter (INDEPENDENT_AMBULATORY_CARE_PROVIDER_SITE_OTHER): Payer: Self-pay | Admitting: Surgery

## 2011-04-19 ENCOUNTER — Ambulatory Visit (INDEPENDENT_AMBULATORY_CARE_PROVIDER_SITE_OTHER): Payer: Medicare Other | Admitting: Surgery

## 2011-04-19 ENCOUNTER — Telehealth (INDEPENDENT_AMBULATORY_CARE_PROVIDER_SITE_OTHER): Payer: Self-pay | Admitting: General Surgery

## 2011-04-19 VITALS — BP 140/92 | HR 60 | Temp 98.2°F | Resp 14 | Ht 62.0 in | Wt 193.0 lb

## 2011-04-19 DIAGNOSIS — Z9889 Other specified postprocedural states: Secondary | ICD-10-CM

## 2011-04-19 NOTE — Telephone Encounter (Signed)
FAXED REQ RECEIVED FROM [facility] FOR HYDROCODONE/APAP 5/325/ #30/ RX OK'D PER V.O. DR. T. CORNETT/REFILL CALLED TO [phone]/ PT AWARE/GY

## 2011-04-19 NOTE — Patient Instructions (Signed)
Follow up 6 to 8 weeks

## 2011-04-19 NOTE — Progress Notes (Signed)
The patient returns after hemorrhoidectomy 6 weeks ago. She is having some drainage and bleeding. Pain is better. No fever or chills.  Exam: Anal canal shows postop change and swelling. There is minimal fibrinous exudate. There is no redness or fluctuance. Wounds are open and intact  Impression status post 3 column hemorrhoidectomy and lateral internal sphincterotomy for hemorrhoid disease and chronic anal fissure  Plan: She needs to soak more gentle curve out today. I've asked her to soak 3 times a day for 30 minutes each. Return to clinic 6-8 weeks. Improved.  Flomax helped.

## 2011-04-26 ENCOUNTER — Other Ambulatory Visit (INDEPENDENT_AMBULATORY_CARE_PROVIDER_SITE_OTHER): Payer: Self-pay

## 2011-04-26 DIAGNOSIS — G8918 Other acute postprocedural pain: Secondary | ICD-10-CM

## 2011-04-26 MED ORDER — HYDROCODONE-ACETAMINOPHEN 5-325 MG PO TABS: 1.0000 | ORAL_TABLET | Freq: Four times a day (QID) | ORAL | Status: AC | PRN

## 2011-05-07 ENCOUNTER — Telehealth (INDEPENDENT_AMBULATORY_CARE_PROVIDER_SITE_OTHER): Payer: Self-pay

## 2011-05-07 NOTE — Telephone Encounter (Signed)
sure

## 2011-05-07 NOTE — Telephone Encounter (Signed)
Patient wants refill of hydrocodone 5/325. Please advise.Marland Kitchen

## 2011-05-10 ENCOUNTER — Telehealth (INDEPENDENT_AMBULATORY_CARE_PROVIDER_SITE_OTHER): Payer: Self-pay

## 2011-05-10 DIAGNOSIS — G8918 Other acute postprocedural pain: Secondary | ICD-10-CM

## 2011-05-10 MED ORDER — HYDROCODONE-ACETAMINOPHEN 5-325 MG PO TABS: 1.0000 | ORAL_TABLET | Freq: Four times a day (QID) | ORAL | Status: AC | PRN

## 2011-05-10 NOTE — Telephone Encounter (Signed)
Called patient to let her know refill hydrocodone 5/325 #30 [facility] [phone].

## 2011-05-13 ENCOUNTER — Ambulatory Visit (INDEPENDENT_AMBULATORY_CARE_PROVIDER_SITE_OTHER): Payer: Medicare Other | Admitting: Internal Medicine

## 2011-05-13 ENCOUNTER — Encounter: Payer: Self-pay | Admitting: Internal Medicine

## 2011-05-13 VITALS — BP 130/70 | Temp 98.5°F | Wt 195.0 lb

## 2011-05-13 DIAGNOSIS — E785 Hyperlipidemia, unspecified: Secondary | ICD-10-CM

## 2011-05-13 DIAGNOSIS — E119 Type 2 diabetes mellitus without complications: Secondary | ICD-10-CM

## 2011-05-13 DIAGNOSIS — M1990 Unspecified osteoarthritis, unspecified site: Secondary | ICD-10-CM

## 2011-05-13 DIAGNOSIS — I10 Essential (primary) hypertension: Secondary | ICD-10-CM

## 2011-05-13 NOTE — Patient Instructions (Signed)
Please check your hemoglobin A1c every 3 months    It is important that you exercise regularly, at least 20 minutes 3 to 4 times per week.  If you develop chest pain or shortness of breath seek  medical attention.  You need to lose weight.  Consider a lower calorie diet and regular exercise. 

## 2011-05-13 NOTE — Progress Notes (Signed)
  Subjective:    Patient ID: Katie Booker, female    DOB: 1945-07-08, 66 y.o.   MRN: [id number]  HPI  Wt Readings from Last 3 Encounters:  05/13/11 195 lb (88.451 kg)  04/19/11 193 lb (87.544 kg)  04/05/11 197 lb 6.4 oz (89.54 kg)    Review of Systems     Objective:   Physical Exam        Assessment & Plan:

## 2011-05-13 NOTE — Progress Notes (Signed)
  Subjective:    Patient ID: Katie Booker, female    DOB: June 01, 1945, 66 y.o.   MRN: [id number]  HPI  66 year old patient seen today for followup of her type 2 diabetes. She remains on metformin as well as glyburide. She feels that her glycemic control has improved compared to her last visit she has hypertension osteoarthritis hypothyroidism and dyslipidemia. Since her last visit here she has had a hemorrhoidectomy. She continues to improve but her progress has been slow she still remains mildly symptomatic. She has a history of anxiety depression    Review of Systems  Constitutional: Negative.   HENT: Negative for hearing loss, congestion, sore throat, rhinorrhea, dental problem, sinus pressure and tinnitus.   Eyes: Positive for redness. Negative for pain, discharge and visual disturbance.  Respiratory: Negative for cough and shortness of breath.   Cardiovascular: Negative for chest pain, palpitations and leg swelling.  Gastrointestinal: Positive for rectal pain. Negative for nausea, vomiting, abdominal pain, diarrhea, constipation, blood in stool and abdominal distention.  Genitourinary: Negative for dysuria, urgency, frequency, hematuria, flank pain, vaginal bleeding, vaginal discharge, difficulty urinating, vaginal pain and pelvic pain.  Musculoskeletal: Negative for joint swelling, arthralgias and gait problem.  Skin: Negative for rash.  Neurological: Negative for dizziness, syncope, speech difficulty, weakness, numbness and headaches.  Hematological: Negative for adenopathy.  Psychiatric/Behavioral: Negative for behavioral problems, dysphoric mood and agitation. The patient is not nervous/anxious.        Objective:   Physical Exam  Constitutional: She is oriented to person, place, and time. She appears well-developed and well-nourished.  HENT:  Head: Normocephalic.  Right Ear: External ear normal.  Left Ear: External ear normal.  Mouth/Throat: Oropharynx is clear and moist.    Eyes: Conjunctivae and EOM are normal. Pupils are equal, round, and reactive to light.  Neck: Normal range of motion. Neck supple. No thyromegaly present.  Cardiovascular: Normal rate, regular rhythm, normal heart sounds and intact distal pulses.   Pulmonary/Chest: Effort normal and breath sounds normal.  Abdominal: Soft. Bowel sounds are normal. She exhibits no mass. There is no tenderness.  Musculoskeletal: Normal range of motion.  Lymphadenopathy:    She has no cervical adenopathy.  Neurological: She is alert and oriented to person, place, and time.  Skin: Skin is warm and dry. No rash noted.  Psychiatric: She has a normal mood and affect. Her behavior is normal.          Assessment & Plan:   Diabetes mellitus type 2. We'll check a hemoglobin A1c Hypertension osteoarthritis Dyslipidemia  Weight loss encouraged we'll check a hemoglobin A1c recheck 3 months

## 2011-05-17 ENCOUNTER — Other Ambulatory Visit (INDEPENDENT_AMBULATORY_CARE_PROVIDER_SITE_OTHER): Payer: Self-pay

## 2011-05-17 DIAGNOSIS — G8918 Other acute postprocedural pain: Secondary | ICD-10-CM

## 2011-05-17 MED ORDER — HYDROCODONE-ACETAMINOPHEN 5-325 MG PO TABS: 1.0000 | ORAL_TABLET | Freq: Four times a day (QID) | ORAL | Status: AC | PRN

## 2011-05-20 ENCOUNTER — Telehealth: Payer: Self-pay | Admitting: Internal Medicine

## 2011-05-20 NOTE — Telephone Encounter (Signed)
Pt called req to get results for a1c.

## 2011-05-20 NOTE — Telephone Encounter (Signed)
Called and spoke with pt- informed A1c 6.8 - continue diet and exercise - almost normal range

## 2011-05-24 ENCOUNTER — Telehealth (INDEPENDENT_AMBULATORY_CARE_PROVIDER_SITE_OTHER): Payer: Self-pay

## 2011-05-24 NOTE — Telephone Encounter (Signed)
Called pt and called in pt Rx to the [facility] on [address] and pt is aware. Called in the Hydrocodone 5/325 #30 with one refill

## 2011-05-24 NOTE — Telephone Encounter (Signed)
Patient still having pain from hemorrhoidectomy 1/29. Patient requesting refill hydrocodone 5/325. Last refilled 3/18. TC on vacation, please advise.

## 2011-06-15 ENCOUNTER — Other Ambulatory Visit (INDEPENDENT_AMBULATORY_CARE_PROVIDER_SITE_OTHER): Payer: Self-pay

## 2011-06-15 DIAGNOSIS — G8918 Other acute postprocedural pain: Secondary | ICD-10-CM

## 2011-06-15 MED ORDER — HYDROCODONE-ACETAMINOPHEN 5-325 MG PO TABS: 1.0000 | ORAL_TABLET | Freq: Four times a day (QID) | ORAL | Status: AC | PRN

## 2011-06-15 NOTE — Telephone Encounter (Signed)
norco refill faxed back to [facility]

## 2011-06-18 ENCOUNTER — Encounter (INDEPENDENT_AMBULATORY_CARE_PROVIDER_SITE_OTHER): Payer: Medicare Other | Admitting: Surgery

## 2011-06-22 ENCOUNTER — Encounter (INDEPENDENT_AMBULATORY_CARE_PROVIDER_SITE_OTHER): Payer: Self-pay | Admitting: Surgery

## 2011-06-22 ENCOUNTER — Ambulatory Visit (INDEPENDENT_AMBULATORY_CARE_PROVIDER_SITE_OTHER): Payer: Medicare Other | Admitting: Surgery

## 2011-06-22 VITALS — BP 138/90 | HR 88 | Temp 97.6°F | Resp 16 | Ht 62.0 in | Wt 198.2 lb

## 2011-06-22 DIAGNOSIS — Z9889 Other specified postprocedural states: Secondary | ICD-10-CM

## 2011-06-22 NOTE — Patient Instructions (Signed)
High fiber diet.  Examples include metamucil,  citrucel and benefiber.  You need 30 grams per day.   Serving size on the bottle. 64 ounces of water a day.Return in three months.

## 2011-06-22 NOTE — Progress Notes (Signed)
Patient returns today for followup after hemorrhoidectomy. She is 2 months out. She has multiple complaints. She is quite anxious and has issues with her pain medication.  Exam: Anal canal healing well. No signs of infection. No evidence of stenosis on exam tone decreased slightly.  Impression: Status post hemorrhoidectomy  Plan: She is considerable anxiety. She also is multiple pain complaints. I do not feel narcotics are appropriate for condition and recommend that she contact her primary care doctor to take a look at her medication list and see if these need to be adjusted. Return in 3 months to reevaluate skin tags to see if need be excised

## 2011-07-22 ENCOUNTER — Other Ambulatory Visit: Payer: Self-pay | Admitting: Internal Medicine

## 2011-07-22 MED ORDER — ALPRAZOLAM 0.5 MG PO TABS: 0.5000 mg | ORAL_TABLET | Freq: Two times a day (BID) | ORAL | Status: DC | PRN

## 2011-07-22 NOTE — Telephone Encounter (Signed)
Spoke with pt- change to directions made and [facility]

## 2011-07-22 NOTE — Telephone Encounter (Signed)
Pt would like kim to return her call concerning increase alprazolam. [facility] [phone]. Pt denied appt.

## 2011-08-12 ENCOUNTER — Ambulatory Visit (INDEPENDENT_AMBULATORY_CARE_PROVIDER_SITE_OTHER): Payer: Medicare Other | Admitting: Internal Medicine

## 2011-08-12 ENCOUNTER — Encounter: Payer: Self-pay | Admitting: Internal Medicine

## 2011-08-12 VITALS — BP 120/86 | Temp 97.8°F | Wt 203.0 lb

## 2011-08-12 DIAGNOSIS — I10 Essential (primary) hypertension: Secondary | ICD-10-CM

## 2011-08-12 DIAGNOSIS — F329 Major depressive disorder, single episode, unspecified: Secondary | ICD-10-CM

## 2011-08-12 DIAGNOSIS — E119 Type 2 diabetes mellitus without complications: Secondary | ICD-10-CM

## 2011-08-12 LAB — HEMOGLOBIN A1C: Hgb A1c MFr Bld: 7.3 % — ABNORMAL HIGH (ref 4.6–6.5)

## 2011-08-12 MED ORDER — GLIMEPIRIDE 4 MG PO TABS: 4.0000 mg | ORAL_TABLET | Freq: Every day | ORAL | Status: DC

## 2011-08-12 NOTE — Progress Notes (Signed)
  Subjective:    Patient ID: Katie Booker, female    DOB: 07-18-1945, 66 y.o.   MRN: [id number]  HPI  66 year old patient who is seen today for followup. She has treated hypertension dyslipidemia and type 2 diabetes which has been controlled on oral medication. 3 months ago hemoglobin A1c 6.8. She has a long history depression which has worsened. She has seen Dr. Karmen Bongo in the past. She admits to a dietary noncompliance and very little exercise    Review of Systems  HENT: Negative for hearing loss, congestion, sore throat, rhinorrhea, dental problem, sinus pressure and tinnitus.   Eyes: Negative for pain, discharge and visual disturbance.  Respiratory: Negative for cough and shortness of breath.   Cardiovascular: Negative for chest pain, palpitations and leg swelling.  Gastrointestinal: Negative for nausea, vomiting, abdominal pain, diarrhea, constipation, blood in stool and abdominal distention.  Genitourinary: Negative for dysuria, urgency, frequency, hematuria, flank pain, vaginal bleeding, vaginal discharge, difficulty urinating, vaginal pain and pelvic pain.  Musculoskeletal: Negative for joint swelling, arthralgias and gait problem.  Skin: Negative for rash.  Neurological: Positive for weakness. Negative for dizziness, syncope, speech difficulty, numbness and headaches.  Hematological: Negative for adenopathy.  Psychiatric/Behavioral: Positive for disturbed wake/sleep cycle, dysphoric mood and decreased concentration. Negative for suicidal ideas, behavioral problems and agitation. The patient is not nervous/anxious.        Objective:   Physical Exam  Constitutional: She is oriented to person, place, and time. She appears well-developed and well-nourished.       Overweight. Normal blood pressure  HENT:  Head: Normocephalic.  Right Ear: External ear normal.  Left Ear: External ear normal.  Mouth/Throat: Oropharynx is clear and moist.  Eyes: Conjunctivae and EOM are  normal. Pupils are equal, round, and reactive to light.  Neck: Normal range of motion. Neck supple. No thyromegaly present.  Cardiovascular: Normal rate, regular rhythm, normal heart sounds and intact distal pulses.   Pulmonary/Chest: Effort normal and breath sounds normal.  Abdominal: Soft. Bowel sounds are normal. She exhibits no mass. There is no tenderness.  Musculoskeletal: Normal range of motion.  Lymphadenopathy:    She has no cervical adenopathy.  Neurological: She is alert and oriented to person, place, and time.  Skin: Skin is warm and dry. No rash noted.  Psychiatric: She has a normal mood and affect. Her behavior is normal.          Assessment & Plan:   Diabetes mellitus. Lifestyle issues discussed. We'll check a hemoglobin A1c. Continue present regimen hypertension stable Worsening depression. Followup Dr. Willia Craze in 3 months

## 2011-08-12 NOTE — Patient Instructions (Addendum)
Behavioral health referral as discussed    It is important that you exercise regularly, at least 20 minutes 3 to 4 times per week.  If you develop chest pain or shortness of breath seek  medical attention.   Please check your hemoglobin A1c every 3 months

## 2011-08-30 ENCOUNTER — Telehealth: Payer: Self-pay | Admitting: Internal Medicine

## 2011-08-30 NOTE — Telephone Encounter (Signed)
Pt called req to get a1c results. Pls call.

## 2011-08-30 NOTE — Telephone Encounter (Signed)
Please call/notify patient that  HghA1c has incr from 6.8 to 7.3;  Stress better diet and exercise and recheck in 3 months; will consider change in meds at that time if still not well controlled

## 2011-08-30 NOTE — Telephone Encounter (Signed)
Please advise 

## 2011-08-30 NOTE — Telephone Encounter (Signed)
Spoke with pt - informed of results and dr. Vernon Prey instructions - has cpx schedule for 3 mos

## 2011-09-07 ENCOUNTER — Other Ambulatory Visit: Payer: Self-pay | Admitting: Internal Medicine

## 2011-09-27 ENCOUNTER — Other Ambulatory Visit: Payer: Self-pay

## 2011-09-27 MED ORDER — ALPRAZOLAM 0.5 MG PO TABS: 0.5000 mg | ORAL_TABLET | Freq: Two times a day (BID) | ORAL | Status: DC | PRN

## 2011-09-30 ENCOUNTER — Other Ambulatory Visit: Payer: Self-pay

## 2011-09-30 ENCOUNTER — Encounter (INDEPENDENT_AMBULATORY_CARE_PROVIDER_SITE_OTHER): Payer: Self-pay | Admitting: Surgery

## 2011-09-30 ENCOUNTER — Ambulatory Visit (INDEPENDENT_AMBULATORY_CARE_PROVIDER_SITE_OTHER): Payer: Medicare Other | Admitting: Surgery

## 2011-09-30 VITALS — BP 128/82 | HR 72 | Temp 96.9°F | Resp 14 | Ht 62.0 in | Wt 199.1 lb

## 2011-09-30 DIAGNOSIS — Z87898 Personal history of other specified conditions: Secondary | ICD-10-CM

## 2011-09-30 DIAGNOSIS — Z8719 Personal history of other diseases of the digestive system: Secondary | ICD-10-CM

## 2011-09-30 DIAGNOSIS — K429 Umbilical hernia without obstruction or gangrene: Secondary | ICD-10-CM

## 2011-09-30 MED ORDER — ZOLPIDEM TARTRATE 10 MG PO TABS: 10.0000 mg | ORAL_TABLET | Freq: Every evening | ORAL | Status: DC | PRN

## 2011-09-30 NOTE — Patient Instructions (Signed)
Hernia Repair Care After These instructions give you information on caring for yourself after your procedure. Your doctor may also give you more specific instructions. Call your doctor if you have any problems or questions after your procedure. HOME CARE   You may have changes in your poops (bowel movements).   You may have loose or watery poop (diarrhea).   You may be not able to poop.   Your bowels will slowly get back to normal.   Do not eat any food that makes you sick to your stomach (nauseous). Eat small meals 4 to 6 times a day instead of 3 large ones.   Do not drink pop. It will give you gas.   Do not drink alcohol.   Do not lift anything heavier than 10 pounds. This is about the weight of a gallon of milk.   Do not do anything that makes you very tired for at least 6 weeks.   Do not get your wound wet for 2 days.   You may take a sponge bath during this time.   After 2 days you may take a shower. Gently pat your surgical cut (incision) dry with a towel. Do not rub it.   For men: You may have been given an athletic supporter (scrotal support) before you left the hospital. It holds your scrotum and testicles closer to your body so there is no strain on your wound. Wear the supporter until your doctor tells you that you do not need it anymore.  GET HELP RIGHT AWAY IF:  You have watery poop, or cannot poop for more than 3 days.   You feel sick to your stomach or throw up (vomit) more than 2 or 3 times.   You have temperature by mouth above 102 F (38.9 C).   You see redness or puffiness (swelling) around your wound.   You see yellowish white fluid (pus) coming from your wound.   You see a bulge or bump in your lower belly (abdomen) or near your groin.   You develop a rash, trouble breathing, or any other symptoms from medicines taken.  MAKE SURE YOU:  Understand these instructions.   Will watch your condition.   Will get help right away if your are not doing  well or get worse.  Document Released: 01/22/2008 Document Revised: 01/28/2011 Document Reviewed: 01/22/2008 [facility] Patient Information 2012 [place], [place].Hernia A hernia occurs when an internal organ pushes out through a weak spot in the abdominal wall. Hernias most commonly occur in the groin and around the navel. Hernias often can be pushed back into place (reduced). Most hernias tend to get worse over time. Some abdominal hernias can get stuck in the opening (irreducible or incarcerated hernia) and cannot be reduced. An irreducible abdominal hernia which is tightly squeezed into the opening is at risk for impaired blood supply (strangulated hernia). A strangulated hernia is a medical emergency. Because of the risk for an irreducible or strangulated hernia, surgery may be recommended to repair a hernia. CAUSES   Heavy lifting.   Prolonged coughing.   Straining to have a bowel movement.   A cut (incision) made during an abdominal surgery.  HOME CARE INSTRUCTIONS   Bed rest is not required. You may continue your normal activities.   Avoid lifting more than 10 pounds (4.5 kg) or straining.   Cough gently. If you are a smoker it is best to stop. Even the best hernia repair can break down with the continual strain  of coughing. Even if you do not have your hernia repaired, a cough will continue to aggravate the problem.   Do not wear anything tight over your hernia. Do not try to keep it in with an outside bandage or truss. These can damage abdominal contents if they are trapped within the hernia sac.   Eat a normal diet.   Avoid constipation. Straining over long periods of time will increase hernia size and encourage breakdown of repairs. If you cannot do this with diet alone, stool softeners may be used.  SEEK IMMEDIATE MEDICAL CARE IF:   You have a fever.   You develop increasing abdominal pain.   You feel nauseous or vomit.   Your hernia is stuck outside the abdomen, looks  discolored, feels hard, or is tender.   You have any changes in your bowel habits or in the hernia that are unusual for you.   You have increased pain or swelling around the hernia.   You cannot push the hernia back in place by applying gentle pressure while lying down.  MAKE SURE YOU:   Understand these instructions.   Will watch your condition.   Will get help right away if you are not doing well or get worse.  Document Released: 02/08/2005 Document Revised: 01/28/2011 Document Reviewed: 09/28/2007 [facility] Patient Information 2012 [place], [place].

## 2011-09-30 NOTE — Progress Notes (Signed)
Patient ID: Katie Booker, female   DOB: 1945-10-24, 66 y.o.   MRN: [id number]  Chief Complaint  Patient presents with  . Routine Post Op    Hemorroidectomy 03/23/11    HPI Katie Booker is a 66 y.o. female.  Patient returns in followup for her hemorrhoid disease. She does have some difficulty sensing the completion of the bowel movement and whether she has evacuated her rectum completely. She also has hygiene issues which bother her. There is no further bleeding, pain or discharge. She has an umbilical hernia taking larger and causing discomfort. She is taking a high-fiber diet this is helped her bowel function. The hernia is increasing in size and causing mild discomfort with exertion around her umbilicus. HPI  Past Medical History  Diagnosis Date  . ANEMIA-NOS 09/18/2009  . DEPRESSION 09/18/2009  . DIABETES MELLITUS, TYPE II 09/18/2009  . GERD 09/18/2009  . HYPERLIPIDEMIA 09/18/2009  . HYPERTENSION 09/18/2009  . HYPOTHYROIDISM 09/18/2009  . OSTEOARTHRITIS 09/18/2009  . Obesity   . OSA (obstructive sleep apnea)   . Cancer   . Hemorrhoids   . Anal fissure     Past Surgical History  Procedure Date  . Tubal fulgaration 1991  . Colonoscopy 2007  . Esophagogastroduodenoscopy 2007  . Tubal ligation   . Hemorrhoid surgery 03/23/2011    Procedure: HEMORRHOIDECTOMY;  Surgeon: Clovis Pu. Josee Speece, MD;  Location: [facility];  Service: General;  Laterality: N/A;  lateral internal sphincterotomy and hemorrhoidectomy    Family History  Problem Relation Age of Onset  . Diabetes Father   . COPD Father   . Cancer Maternal Grandfather     stomach    Social History History  Substance Use Topics  . Smoking status: Former Smoker    Quit date: 02/23/2004  . Smokeless tobacco: Not on file   Comment: quit 2007  . Alcohol Use: Yes    No Known Allergies  Current Outpatient Prescriptions  Medication Sig Dispense Refill  . LASTACAFT 0.25 % SOLN Daily.      Marland Kitchen ALPRAZolam  (XANAX) 0.5 MG tablet Take 1 tablet (0.5 mg total) by mouth 2 (two) times daily as needed.  60 tablet  2  . atenolol-chlorthalidone (TENORETIC) 50-25 MG per tablet Take 1 tablet by mouth daily.  90 tablet  3  . Calcium Carbonate-Vit D-Min (GNP CALCIUM 600 PLUS D/MINERAL) 600-400 MG-UNIT TABS Take 1 tablet by mouth 2 (two) times daily.        . citalopram (CELEXA) 40 MG tablet Take 1 tablet (40 mg total) by mouth daily.  90 tablet  6  . furosemide (LASIX) 20 MG tablet Take 1 tablet (20 mg total) by mouth daily.  90 tablet  6  . gemfibrozil (LOPID) 600 MG tablet Take 1 tablet (600 mg total) by mouth 2 (two) times daily before a meal.  180 tablet  6  . glimepiride (AMARYL) 4 MG tablet Take 1 tablet (4 mg total) by mouth daily before breakfast.  90 tablet  3  . glucose blood (ONE TOUCH ULTRA TEST) test strip Use as instructed  100 each  6  . Lancets (ONETOUCH ULTRASOFT) lancets Use daily  100 each  6  . levothyroxine (SYNTHROID, LEVOTHROID) 150 MCG tablet Take 1 tablet (150 mcg total) by mouth daily.  90 tablet  6  . metFORMIN (GLUCOPHAGE) 1000 MG tablet Take 1 tablet (1,000 mg total) by mouth 2 (two) times daily with a meal.  180 tablet  6  . Multiple  Vitamin (MULTIVITAMIN) tablet Take 1 tablet by mouth daily.        . Multiple Vitamins-Minerals (EYE VITAMINS) TABS Take by mouth daily.        . naproxen sodium (ANAPROX) 550 MG tablet TAKE ONE TABLET BY MOUTH TWICE A DAY WITH MEALS  180 tablet  1  . omeprazole (PRILOSEC) 20 MG capsule Take 1 capsule (20 mg total) by mouth daily.  90 capsule  6  . pregabalin (LYRICA) 75 MG capsule Take 1 capsule (75 mg total) by mouth daily.  90 capsule  6  . traZODone (DESYREL) 100 MG tablet Take 1 tablet (100 mg total) by mouth at bedtime.  90 tablet  6  . zolpidem (AMBIEN) 10 MG tablet Take 1 tablet (10 mg total) by mouth at bedtime as needed.  30 tablet  3  . DISCONTD: zolpidem (AMBIEN) 10 MG tablet Take 1 tablet (10 mg total) by mouth at bedtime as needed.  30  tablet  3    Review of Systems Review of Systems  Constitutional: Negative for fever, chills and unexpected weight change.  HENT: Negative for hearing loss, congestion, sore throat, trouble swallowing and voice change.   Eyes: Negative for visual disturbance.  Respiratory: Negative for cough and wheezing.   Cardiovascular: Negative for chest pain, palpitations and leg swelling.  Gastrointestinal: Positive for abdominal pain. Negative for nausea, vomiting, diarrhea, constipation, blood in stool, abdominal distention and anal bleeding.  Genitourinary: Negative for hematuria, vaginal bleeding and difficulty urinating.  Musculoskeletal: Negative for arthralgias.  Skin: Negative for rash and wound.  Neurological: Negative for seizures, syncope and headaches.  Hematological: Negative for adenopathy. Does not bruise/bleed easily.  Psychiatric/Behavioral: Negative for confusion.    Blood pressure 128/82, pulse 72, temperature 96.9 F (36.1 C), temperature source Temporal, resp. rate 14, height 5\' 2"  (1.575 m), weight 199 lb 2 oz (90.323 kg).  Physical Exam Physical Exam  Constitutional: She is oriented to person, place, and time. She appears well-developed and well-nourished.  HENT:  Head: Normocephalic and atraumatic.  Eyes: EOM are normal. Pupils are equal, round, and reactive to light.  Neck: Normal range of motion. Neck supple.  Cardiovascular: Normal rate and regular rhythm.   Pulmonary/Chest: Effort normal and breath sounds normal.  Abdominal: Soft. She exhibits no distension. There is no tenderness.    Genitourinary:     Musculoskeletal: Normal range of motion.  Neurological: She is alert and oriented to person, place, and time.  Skin: Skin is warm and dry.      Assessment    Umbilical hernia symptomatic reducible  History of hemorrhoidectomy    Plan   discussed repair of umbilical hernia with patient. Risks, benefits and alternative therapies discussed. She would  like to proceed since the hernia is becoming symptomatic.  The risk of hernia repair include bleeding,  Infection,   Recurrence of the hernia,  Mesh use, chronic pain,  Organ injury,  Bowel injury,  Bladder injury,   nerve injury with numbness around the incision,  Death,  and worsening of preexisting  medical problems.  The alternatives to surgery have been discussed as well..  Long term expectations of both operative and non operative treatments have been discussed.   The patient agrees to proceed.  As for her sensation issues with her rectum, this should improve with time. If not, she may require further evaluation by  a colorectal specialist.       Elizette Shek A. 09/30/2011, 1:57 PM

## 2011-11-04 ENCOUNTER — Encounter (HOSPITAL_COMMUNITY): Admission: RE | Payer: Self-pay | Source: Ambulatory Visit

## 2011-11-04 ENCOUNTER — Ambulatory Visit (HOSPITAL_COMMUNITY): Admission: RE | Admit: 2011-11-04 | Payer: Medicare Other | Source: Ambulatory Visit | Admitting: Surgery

## 2011-11-04 SURGERY — REPAIR, HERNIA, UMBILICAL, ADULT
Anesthesia: General

## 2011-11-12 ENCOUNTER — Ambulatory Visit: Payer: Medicare Other | Admitting: Internal Medicine

## 2011-11-12 ENCOUNTER — Encounter: Payer: Self-pay | Admitting: Internal Medicine

## 2011-11-12 ENCOUNTER — Ambulatory Visit (INDEPENDENT_AMBULATORY_CARE_PROVIDER_SITE_OTHER): Payer: Medicare Other | Admitting: Internal Medicine

## 2011-11-12 VITALS — BP 122/80 | HR 82 | Temp 98.5°F | Resp 18 | Ht 61.0 in | Wt 197.0 lb

## 2011-11-12 DIAGNOSIS — I10 Essential (primary) hypertension: Secondary | ICD-10-CM

## 2011-11-12 DIAGNOSIS — E785 Hyperlipidemia, unspecified: Secondary | ICD-10-CM

## 2011-11-12 DIAGNOSIS — E6609 Other obesity due to excess calories: Secondary | ICD-10-CM

## 2011-11-12 DIAGNOSIS — M1990 Unspecified osteoarthritis, unspecified site: Secondary | ICD-10-CM

## 2011-11-12 DIAGNOSIS — E669 Obesity, unspecified: Secondary | ICD-10-CM

## 2011-11-12 DIAGNOSIS — E119 Type 2 diabetes mellitus without complications: Secondary | ICD-10-CM

## 2011-11-12 DIAGNOSIS — Z0000 Encounter for general adult medical examination without abnormal findings: Secondary | ICD-10-CM

## 2011-11-12 DIAGNOSIS — D649 Anemia, unspecified: Secondary | ICD-10-CM

## 2011-11-12 DIAGNOSIS — E039 Hypothyroidism, unspecified: Secondary | ICD-10-CM

## 2011-11-12 DIAGNOSIS — Z23 Encounter for immunization: Secondary | ICD-10-CM

## 2011-11-12 DIAGNOSIS — K219 Gastro-esophageal reflux disease without esophagitis: Secondary | ICD-10-CM

## 2011-11-12 DIAGNOSIS — G4733 Obstructive sleep apnea (adult) (pediatric): Secondary | ICD-10-CM

## 2011-11-12 LAB — CBC WITH DIFFERENTIAL/PLATELET
Basophils Absolute: 0 10*3/uL (ref 0.0–0.1)
Basophils Relative: 0.9 % (ref 0.0–3.0)
Eosinophils Absolute: 0.2 10*3/uL (ref 0.0–0.7)
Hemoglobin: 11.7 g/dL — ABNORMAL LOW (ref 12.0–15.0)
MCHC: 32.5 g/dL (ref 30.0–36.0)
MCV: 74.3 fl — ABNORMAL LOW (ref 78.0–100.0)
Monocytes Absolute: 0.5 10*3/uL (ref 0.1–1.0)
Neutro Abs: 2.7 10*3/uL (ref 1.4–7.7)
RBC: 4.84 Mil/uL (ref 3.87–5.11)
RDW: 17.3 % — ABNORMAL HIGH (ref 11.5–14.6)

## 2011-11-12 LAB — LDL CHOLESTEROL, DIRECT: Direct LDL: 145.5 mg/dL

## 2011-11-12 LAB — MICROALBUMIN / CREATININE URINE RATIO: Microalb, Ur: 0.6 mg/dL (ref 0.0–1.9)

## 2011-11-12 LAB — COMPREHENSIVE METABOLIC PANEL
ALT: 24 U/L (ref 0–35)
AST: 20 U/L (ref 0–37)
CO2: 28 mEq/L (ref 19–32)
Calcium: 9.6 mg/dL (ref 8.4–10.5)
Chloride: 97 mEq/L (ref 96–112)
Creatinine, Ser: 0.8 mg/dL (ref 0.4–1.2)
Sodium: 136 mEq/L (ref 135–145)
Total Bilirubin: 0.5 mg/dL (ref 0.3–1.2)
Total Protein: 7.1 g/dL (ref 6.0–8.3)

## 2011-11-12 LAB — LIPID PANEL
Cholesterol: 288 mg/dL — ABNORMAL HIGH (ref 0–200)
Total CHOL/HDL Ratio: 7
VLDL: 169.4 mg/dL — ABNORMAL HIGH (ref 0.0–40.0)

## 2011-11-12 MED ORDER — ONETOUCH ULTRASOFT LANCETS MISC: Status: DC

## 2011-11-12 MED ORDER — PREGABALIN 75 MG PO CAPS: 75.0000 mg | ORAL_CAPSULE | Freq: Every day | ORAL | Status: DC

## 2011-11-12 MED ORDER — CITALOPRAM HYDROBROMIDE 40 MG PO TABS: 40.0000 mg | ORAL_TABLET | Freq: Every day | ORAL | Status: DC

## 2011-11-12 MED ORDER — GEMFIBROZIL 600 MG PO TABS: 600.0000 mg | ORAL_TABLET | Freq: Two times a day (BID) | ORAL | Status: DC

## 2011-11-12 MED ORDER — FUROSEMIDE 20 MG PO TABS: 20.0000 mg | ORAL_TABLET | Freq: Every day | ORAL | Status: DC

## 2011-11-12 MED ORDER — METFORMIN HCL 1000 MG PO TABS: 1000.0000 mg | ORAL_TABLET | Freq: Two times a day (BID) | ORAL | Status: DC

## 2011-11-12 MED ORDER — GLUCOSE BLOOD VI STRP: ORAL_STRIP | Status: DC

## 2011-11-12 MED ORDER — GLIMEPIRIDE 4 MG PO TABS
4.0000 mg | ORAL_TABLET | Freq: Every day | ORAL | Status: DC
Start: 2011-11-12 — End: 2011-11-30

## 2011-11-12 MED ORDER — OMEPRAZOLE 20 MG PO CPDR: 20.0000 mg | DELAYED_RELEASE_CAPSULE | Freq: Every day | ORAL | Status: DC

## 2011-11-12 MED ORDER — ATENOLOL-CHLORTHALIDONE 50-25 MG PO TABS: 1.0000 | ORAL_TABLET | Freq: Every day | ORAL | Status: DC

## 2011-11-12 MED ORDER — ZOLPIDEM TARTRATE 10 MG PO TABS: 10.0000 mg | ORAL_TABLET | Freq: Every evening | ORAL | Status: DC | PRN

## 2011-11-12 MED ORDER — TRAZODONE HCL 100 MG PO TABS: 100.0000 mg | ORAL_TABLET | Freq: Every day | ORAL | Status: DC

## 2011-11-12 MED ORDER — ATORVASTATIN CALCIUM 40 MG PO TABS: 40.0000 mg | ORAL_TABLET | Freq: Every day | ORAL | Status: DC

## 2011-11-12 MED ORDER — ALPRAZOLAM 0.5 MG PO TABS: 0.5000 mg | ORAL_TABLET | Freq: Two times a day (BID) | ORAL | Status: DC | PRN

## 2011-11-12 MED ORDER — LEVOTHYROXINE SODIUM 150 MCG PO TABS: 150.0000 ug | ORAL_TABLET | Freq: Every day | ORAL | Status: DC

## 2011-11-12 NOTE — Patient Instructions (Signed)
Please check your hemoglobin A1c every 3 months  Limit your sodium (Salt) intake    It is important that you exercise regularly, at least 20 minutes 3 to 4 times per week.  If you develop chest pain or shortness of breath seek  medical attention.  Avoids foods high in acid such as tomatoes citrus juices, and spicy foods.  Avoid eating within two hours of lying down or before exercising.  Do not overheat.  Try smaller more frequent meals.  If symptoms persist, elevate the head of her bed 12 inches while sleeping.

## 2011-11-12 NOTE — Progress Notes (Signed)
Subjective:    Patient ID: Katie Booker, female    DOB: 1945/08/30, 66 y.o.   MRN: [id number]  HPI  65 year old patient who is seen today for a wellness exam and medical problems include type 2 diabetes. She has exogenous obesity and a history of dyslipidemia. She has treated hypertension. She is followed by OB/GYN as well as general surgery. She has had a prior hemorrhoidectomy earlier this year and is anticipating an umbilical hernia repair. In general she is doing reasonably well. She has a history of depression. She also has a history of OSA but has been intolerant of CPAP in the past.  Past Medical History  Diagnosis Date  . ANEMIA-NOS 09/18/2009  . DEPRESSION 09/18/2009  . DIABETES MELLITUS, TYPE II 09/18/2009  . GERD 09/18/2009  . HYPERLIPIDEMIA 09/18/2009  . HYPERTENSION 09/18/2009  . HYPOTHYROIDISM 09/18/2009  . OSTEOARTHRITIS 09/18/2009  . Obesity   . OSA (obstructive sleep apnea)   . Cancer   . Hemorrhoids   . Anal fissure     History   Social History  . Marital Status: Divorced    Spouse Name: N/A    Number of Children: N/A  . Years of Education: N/A   Occupational History  . Not on file.   Social History Main Topics  . Smoking status: Former Smoker    Quit date: 02/23/2004  . Smokeless tobacco: Not on file   Comment: quit 2007  . Alcohol Use: Yes  . Drug Use: No  . Sexually Active: Not on file   Other Topics Concern  . Not on file   Social History Narrative  . No narrative on file    Past Surgical History  Procedure Date  . Tubal fulgaration 1991  . Colonoscopy 2007  . Esophagogastroduodenoscopy 2007  . Tubal ligation   . Hemorrhoid surgery 03/23/2011    Procedure: HEMORRHOIDECTOMY;  Surgeon: Clovis Pu. Cornett, MD;  Location: [facility];  Service: General;  Laterality: N/A;  lateral internal sphincterotomy and hemorrhoidectomy    Family History  Problem Relation Age of Onset  . Diabetes Father   . COPD Father   . Cancer Maternal  Grandfather     stomach    No Known Allergies  Current Outpatient Prescriptions on File Prior to Visit  Medication Sig Dispense Refill  . ALPRAZolam (XANAX) 0.5 MG tablet Take 1 tablet (0.5 mg total) by mouth 2 (two) times daily as needed.  60 tablet  2  . atenolol-chlorthalidone (TENORETIC) 50-25 MG per tablet Take 1 tablet by mouth daily.  90 tablet  3  . Calcium Carbonate-Vit D-Min (GNP CALCIUM 600 PLUS D/MINERAL) 600-400 MG-UNIT TABS Take 1 tablet by mouth 2 (two) times daily.        . citalopram (CELEXA) 40 MG tablet Take 1 tablet (40 mg total) by mouth daily.  90 tablet  6  . furosemide (LASIX) 20 MG tablet Take 1 tablet (20 mg total) by mouth daily.  90 tablet  6  . gemfibrozil (LOPID) 600 MG tablet Take 1 tablet (600 mg total) by mouth 2 (two) times daily before a meal.  180 tablet  6  . glimepiride (AMARYL) 4 MG tablet Take 1 tablet (4 mg total) by mouth daily before breakfast.  90 tablet  3  . glucose blood (ONE TOUCH ULTRA TEST) test strip Use as instructed  100 each  6  . Lancets (ONETOUCH ULTRASOFT) lancets Use daily  100 each  6  . LASTACAFT 0.25 % SOLN Daily.      Marland Kitchen  levothyroxine (SYNTHROID, LEVOTHROID) 150 MCG tablet Take 1 tablet (150 mcg total) by mouth daily.  90 tablet  6  . metFORMIN (GLUCOPHAGE) 1000 MG tablet Take 1 tablet (1,000 mg total) by mouth 2 (two) times daily with a meal.  180 tablet  6  . Multiple Vitamin (MULTIVITAMIN) tablet Take 1 tablet by mouth daily.        . Multiple Vitamins-Minerals (EYE VITAMINS) TABS Take by mouth daily.        . naproxen sodium (ANAPROX) 550 MG tablet TAKE ONE TABLET BY MOUTH TWICE A DAY WITH MEALS  180 tablet  1  . omeprazole (PRILOSEC) 20 MG capsule Take 1 capsule (20 mg total) by mouth daily.  90 capsule  6  . pregabalin (LYRICA) 75 MG capsule Take 1 capsule (75 mg total) by mouth daily.  90 capsule  6  . traZODone (DESYREL) 100 MG tablet Take 1 tablet (100 mg total) by mouth at bedtime.  90 tablet  6  . zolpidem (AMBIEN) 10  MG tablet Take 1 tablet (10 mg total) by mouth at bedtime as needed.  30 tablet  3    BP 122/80  Pulse 82  Temp 98.5 F (36.9 C) (Oral)  Resp 18  Ht 5\' 1"  (1.549 m)  Wt 197 lb (89.359 kg)  BMI 37.22 kg/m2  SpO2 96%   1. Risk factors, based on past  M,S,F history- cardiovascular risk factors include hypertension diabetes and dyslipidemia  2.  Physical activities: Does have a history of arthritis and exogenous obesity but no real exercise limitations  3.  Depression/mood: History depression which has been well controlled on therapy 4.  Hearing: No deficits  5.  ADL's: Independent in all aspects of daily living  6.  Fall risk: Low  7.  Home safety: No problems identified  8.  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  Counseling: Weight loss exercise encouraged  10. Lab orders based on risk factors: Laboratory profile including hemoglobin A1c lipid profile and TSH will be reviewed  11. Referral : Followup GYN and Gen. surgery and ophthalmology  12. Care plan: Weight loss exercise encouraged  13. Cognitive assessment: Alert and oriented with normal affect. No cognitive dysfunction.      Review of Systems  Constitutional: Negative for fever, appetite change, fatigue and unexpected weight change.  HENT: Negative for hearing loss, ear pain, nosebleeds, congestion, sore throat, mouth sores, trouble swallowing, neck stiffness, dental problem, voice change, sinus pressure and tinnitus.   Eyes: Negative for photophobia, pain, redness and visual disturbance.  Respiratory: Negative for cough, chest tightness and shortness of breath.   Cardiovascular: Negative for chest pain, palpitations and leg swelling.  Gastrointestinal: Negative for nausea, vomiting, abdominal pain, diarrhea, constipation, blood in stool, abdominal distention and rectal pain.  Genitourinary: Negative for  dysuria, urgency, frequency, hematuria, flank pain, vaginal bleeding, vaginal discharge, difficulty urinating, genital sores, vaginal pain, menstrual problem and pelvic pain.  Musculoskeletal: Positive for back pain and arthralgias.  Skin: Negative for rash.  Neurological: Negative for dizziness, syncope, speech difficulty, weakness, light-headedness, numbness and headaches.  Hematological: Negative for adenopathy. Does not bruise/bleed easily.  Psychiatric/Behavioral: Negative for suicidal ideas, behavioral problems, self-injury, dysphoric mood and agitation. The patient is not nervous/anxious.        Objective:   Physical Exam  Constitutional: She is oriented to person, place, and time. She appears well-developed and well-nourished.  HENT:  Head: Normocephalic and atraumatic.  Right Ear: External ear normal.  Left Ear: External ear normal.  Mouth/Throat: Oropharynx is clear and moist.       Marked pharyngeal crowding  Eyes: Conjunctivae normal and EOM are normal.  Neck: Normal range of motion. Neck supple. No JVD present. No thyromegaly present.  Cardiovascular: Normal rate, regular rhythm, normal heart sounds and intact distal pulses.   No murmur heard.      Posterior tibial pulses full. Dorsalis pedis pulses faint  Pulmonary/Chest: Effort normal and breath sounds normal. She has no wheezes. She has no rales.  Abdominal: Soft. Bowel sounds are normal. She exhibits no distension and no mass. There is no tenderness. There is no rebound and no guarding.       Obese. Umbilical hernia  Genitourinary: Vagina normal.  Musculoskeletal: Normal range of motion. She exhibits no edema and no tenderness.  Neurological: She is alert and oriented to person, place, and time. She has normal reflexes. No cranial nerve deficit. She exhibits normal muscle tone. Coordination normal.  Skin: Skin is warm and dry. No rash noted.  Psychiatric: She has a normal mood and affect. Her behavior is normal.           Assessment & Plan:   Preventive health examination Diabetes. Weight loss exercise encouraged. Will check a hemoglobin A1c Hypertension stable Dyslipidemia. We'll check a lipid profile Hypothyroidism. We'll check a TSH  Recheck 3 months Followup OB/GYN general surgery and ophthalmology

## 2011-11-15 ENCOUNTER — Other Ambulatory Visit: Payer: Self-pay | Admitting: Internal Medicine

## 2011-11-15 MED ORDER — GLUCOSE BLOOD VI STRP: 1.0000 | ORAL_STRIP | Freq: Every day | Status: DC

## 2011-11-15 NOTE — Telephone Encounter (Signed)
[facility] called re: glucose blood (ONE TOUCH ULTRA TEST) test strip. Need instructions, other than use as directed.

## 2011-11-15 NOTE — Telephone Encounter (Signed)
done

## 2011-11-16 ENCOUNTER — Telehealth: Payer: Self-pay

## 2011-11-16 NOTE — Telephone Encounter (Signed)
t aware ins. Form ready for pick up

## 2011-11-18 ENCOUNTER — Encounter (INDEPENDENT_AMBULATORY_CARE_PROVIDER_SITE_OTHER): Payer: Medicare Other | Admitting: Surgery

## 2011-11-30 ENCOUNTER — Encounter: Payer: Self-pay | Admitting: Internal Medicine

## 2011-11-30 ENCOUNTER — Ambulatory Visit (INDEPENDENT_AMBULATORY_CARE_PROVIDER_SITE_OTHER): Payer: Medicare Other | Admitting: Internal Medicine

## 2011-11-30 VITALS — BP 116/70 | Temp 98.1°F | Wt 198.0 lb

## 2011-11-30 DIAGNOSIS — E119 Type 2 diabetes mellitus without complications: Secondary | ICD-10-CM

## 2011-11-30 DIAGNOSIS — J069 Acute upper respiratory infection, unspecified: Secondary | ICD-10-CM

## 2011-11-30 DIAGNOSIS — E785 Hyperlipidemia, unspecified: Secondary | ICD-10-CM

## 2011-11-30 DIAGNOSIS — I10 Essential (primary) hypertension: Secondary | ICD-10-CM

## 2011-11-30 MED ORDER — GLIMEPIRIDE 4 MG PO TABS: ORAL_TABLET | ORAL | Status: DC

## 2011-11-30 NOTE — Progress Notes (Signed)
  Subjective:    Patient ID: Katie Booker, female    DOB: [birth year], 66 y.o.   MRN: [id number]  HPI  66 year old patient who is seen today for followup. The past few days she has had some mild sinus congestion and cough her main complaint is the congestion involving the left ear. She has a history of type 2 diabetes and recently has had some documented hypoglycemia. She has been seen recently for a preventive health exam and does have a history of severe hypertriglyceridemia. LDL cholesterol was 145 and the patient was placed on atorvastatin recently. No fever   Review of Systems  Constitutional: Negative.   HENT: Positive for hearing loss and congestion. Negative for sore throat, rhinorrhea, dental problem, sinus pressure and tinnitus.   Eyes: Negative for pain, discharge and visual disturbance.  Respiratory: Negative for cough and shortness of breath.   Cardiovascular: Negative for chest pain, palpitations and leg swelling.  Gastrointestinal: Negative for nausea, vomiting, abdominal pain, diarrhea, constipation, blood in stool and abdominal distention.  Genitourinary: Negative for dysuria, urgency, frequency, hematuria, flank pain, vaginal bleeding, vaginal discharge, difficulty urinating, vaginal pain and pelvic pain.  Musculoskeletal: Negative for joint swelling, arthralgias and gait problem.  Skin: Negative for rash.  Neurological: Negative for dizziness, syncope, speech difficulty, weakness, numbness and headaches.  Hematological: Negative for adenopathy.  Psychiatric/Behavioral: Negative for behavioral problems, dysphoric mood and agitation. The patient is not nervous/anxious.        Objective:   Physical Exam  Constitutional: She is oriented to person, place, and time. She appears well-developed and well-nourished.  HENT:  Head: Normocephalic.  Right Ear: External ear normal.  Left Ear: External ear normal.  Mouth/Throat: Oropharynx is clear and moist.       Cerumen  impaction on the left  Eyes: Conjunctivae normal and EOM are normal. Pupils are equal, round, and reactive to light.  Neck: Normal range of motion. Neck supple. No thyromegaly present.  Cardiovascular: Normal rate, regular rhythm, normal heart sounds and intact distal pulses.   Pulmonary/Chest: Effort normal and breath sounds normal.  Abdominal: Soft. Bowel sounds are normal. She exhibits no mass. There is no tenderness.  Musculoskeletal: Normal range of motion.  Lymphadenopathy:    She has no cervical adenopathy.  Neurological: She is alert and oriented to person, place, and time.  Skin: Skin is warm and dry. No rash noted.  Psychiatric: She has a normal mood and affect. Her behavior is normal.          Assessment & Plan:   Left-sided tumor impaction. Will irrigate the canal until clear Diabetes mellitus. Recent episode of hypoglycemia. We'll decrease Amaryl to 2 mg daily Hypertension well controlled Significant hypertriglyceridemia. Atorvastatin started will add fish oil to her regimen

## 2011-11-30 NOTE — Patient Instructions (Addendum)
You need to lose weight.  Consider a lower calorie diet and regular exercise.    It is important that you exercise regularly, at least 20 minutes 3 to 4 times per week.  If you develop chest pain or shortness of breath seek  medical attention.   Decrease Amaryl to 2 mg daily Add  fish oil supplements to your daily regimen

## 2011-12-29 ENCOUNTER — Other Ambulatory Visit: Payer: Self-pay

## 2011-12-29 MED ORDER — ALPRAZOLAM 0.5 MG PO TABS: 0.5000 mg | ORAL_TABLET | Freq: Two times a day (BID) | ORAL | Status: DC | PRN

## 2012-02-03 ENCOUNTER — Other Ambulatory Visit: Payer: Self-pay | Admitting: *Deleted

## 2012-02-03 MED ORDER — ZOLPIDEM TARTRATE 10 MG PO TABS: 10.0000 mg | ORAL_TABLET | Freq: Every evening | ORAL | Status: DC | PRN

## 2012-02-17 ENCOUNTER — Telehealth: Payer: Self-pay | Admitting: Internal Medicine

## 2012-02-17 ENCOUNTER — Encounter: Payer: Self-pay | Admitting: Family Medicine

## 2012-02-17 ENCOUNTER — Ambulatory Visit (INDEPENDENT_AMBULATORY_CARE_PROVIDER_SITE_OTHER): Payer: Medicare Other | Admitting: Family Medicine

## 2012-02-17 VITALS — BP 140/84 | HR 93 | Temp 98.1°F | Ht 62.0 in | Wt 195.0 lb

## 2012-02-17 DIAGNOSIS — H6980 Other specified disorders of Eustachian tube, unspecified ear: Secondary | ICD-10-CM

## 2012-02-17 NOTE — Patient Instructions (Addendum)
-  flonase 2 sprays each nostril daily  -calritin daily  -no q-tips in ears  -follow up with your doctor in 1 month or sooner if worsening or other concerns

## 2012-02-17 NOTE — Telephone Encounter (Signed)
[facility] Triage Call Report Triage Record Num: [id number] Operator: Thayer Headings Patient Name: Deetta Siegmann Call Date & Time: 02/15/2012 2:23:04PM Patient Phone: [phone] PCP: Gordy Savers Patient Gender: Female PCP Fax : [phone] Patient DOB: [birth year] [facility] Name[facility]  Reason for Call: Caller: Brook/Patient; PCP: Eleonore Chiquito [facility]); CB#: [phone]; Calling today 02/15/12 regarding having left ear congestion with some mild pain. Onset 02/08/12. Also some swelling to entrance to ear. Emergent symptoms r/o by Ear Hearing Change guidelines with exception of pain in ear followed by a pop with new or sudden onset of change in hearing, ringing in ear, ear drainage or continued pain and not resolved within 12 hours. (See Provider Within 24 Hours). Care advice given. Advised to [facility].  Protocol(s) Used: Ear: Hearing Change Recommended Outcome per Protocol: See Provider within 24 hours Reason for Outcome: Pain in ear followed by a "pop" with new or sudden onset of change in hearing, ringing in ear, ear drainage, OR continued pain AND not resolved within 12 hours

## 2012-02-17 NOTE — Progress Notes (Signed)
Chief Complaint  Patient presents with  . Ear Fullness    left    HPI:  Acute visit for ear discomfort: -started about 1 week -symptoms: L ear "stopped up", has had ear wax before and felt like this and got it cleaned out, cough, nasal congestion too - hx AR with chronic nasal congestion, sneezing, watery eyes, feels like hearing decreased too -Denies: fevers, much pain in ear - more stopped up feeling, tinnius   ROS: See pertinent positives and negatives per HPI.  Past Medical History  Diagnosis Date  . ANEMIA-NOS 09/18/2009  . DEPRESSION 09/18/2009  . DIABETES MELLITUS, TYPE II 09/18/2009  . GERD 09/18/2009  . HYPERLIPIDEMIA 09/18/2009  . HYPERTENSION 09/18/2009  . HYPOTHYROIDISM 09/18/2009  . OSTEOARTHRITIS 09/18/2009  . Obesity   . OSA (obstructive sleep apnea)   . Cancer   . Hemorrhoids   . Anal fissure     Family History  Problem Relation Age of Onset  . Diabetes Father   . COPD Father   . Cancer Maternal Grandfather     stomach    History   Social History  . Marital Status: Divorced    Spouse Name: N/A    Number of Children: N/A  . Years of Education: N/A   Social History Main Topics  . Smoking status: Former Smoker    Quit date: 02/23/2004  . Smokeless tobacco: None     Comment: quit 2007  . Alcohol Use: Yes  . Drug Use: No  . Sexually Active: None   Other Topics Concern  . None   Social History Narrative  . None    Current outpatient prescriptions:ALPRAZolam (XANAX) 0.5 MG tablet, Take 1 tablet (0.5 mg total) by mouth 2 (two) times daily as needed., Disp: 60 tablet, Rfl: 2;  atenolol-chlorthalidone (TENORETIC) 50-25 MG per tablet, Take 1 tablet by mouth daily., Disp: 90 tablet, Rfl: 3;  atorvastatin (LIPITOR) 40 MG tablet, Take 1 tablet (40 mg total) by mouth daily., Disp: 90 tablet, Rfl: 3 Calcium Carbonate-Vit D-Min (GNP CALCIUM 600 PLUS D/MINERAL) 600-400 MG-UNIT TABS, Take 1 tablet by mouth 2 (two) times daily.  , Disp: , Rfl: ;  citalopram  (CELEXA) 40 MG tablet, Take 1 tablet (40 mg total) by mouth daily., Disp: 90 tablet, Rfl: 6;  furosemide (LASIX) 20 MG tablet, Take 1 tablet (20 mg total) by mouth daily., Disp: 90 tablet, Rfl: 6 gemfibrozil (LOPID) 600 MG tablet, Take 1 tablet (600 mg total) by mouth 2 (two) times daily before a meal., Disp: 180 tablet, Rfl: 6;  glimepiride (AMARYL) 4 MG tablet, One half tablet daily, Disp: 90 tablet, Rfl: 3;  glucose blood (ONE TOUCH ULTRA TEST) test strip, 1 each by Other route daily. Use as instructed, Disp: 100 each, Rfl: 6;  Lancets (ONETOUCH ULTRASOFT) lancets, Use daily, Disp: 100 each, Rfl: 6 LASTACAFT 0.25 % SOLN, Daily., Disp: , Rfl: ;  levothyroxine (SYNTHROID, LEVOTHROID) 150 MCG tablet, Take 1 tablet (150 mcg total) by mouth daily., Disp: 90 tablet, Rfl: 6;  metFORMIN (GLUCOPHAGE) 1000 MG tablet, Take 1 tablet (1,000 mg total) by mouth 2 (two) times daily with a meal., Disp: 180 tablet, Rfl: 6;  Multiple Vitamin (MULTIVITAMIN) tablet, Take 1 tablet by mouth daily.  , Disp: , Rfl:  naproxen sodium (ANAPROX) 550 MG tablet, TAKE ONE TABLET BY MOUTH TWICE A DAY WITH MEALS, Disp: 180 tablet, Rfl: 1;  Omega-3 Fatty Acids (FISH OIL) 1200 MG CAPS, Take 2 capsules by mouth 2 (two) times daily., Disp: ,  Rfl: ;  omeprazole (PRILOSEC) 20 MG capsule, Take 1 capsule (20 mg total) by mouth daily., Disp: 90 capsule, Rfl: 6;  pregabalin (LYRICA) 75 MG capsule, Take 1 capsule (75 mg total) by mouth daily., Disp: 90 capsule, Rfl: 6 traZODone (DESYREL) 100 MG tablet, Take 1 tablet (100 mg total) by mouth at bedtime., Disp: 90 tablet, Rfl: 6;  zolpidem (AMBIEN) 10 MG tablet, Take 1 tablet (10 mg total) by mouth at bedtime as needed., Disp: 30 tablet, Rfl: 3;  Multiple Vitamins-Minerals (EYE VITAMINS) TABS, Take by mouth daily.  , Disp: , Rfl:   EXAM:  Filed Vitals:   02/17/12 1443  BP: 140/84  Pulse: 93  Temp: 98.1 F (36.7 C)    Body mass index is 35.67 kg/(m^2).  GENERAL: vitals reviewed and listed  above, alert, oriented, appears well hydrated and in no acute distress  HEENT: atraumatic, conjunttiva clear, no obvious abnormalities on inspection of external nose and ears, ear canals clear, curved ear canals so only 1/2 of RM visualized but appears normal, some clear fluid behind TMs, nasal turbinates boggy and pale, clear rhinorrhea, PND, no pain on tugging of tragus  NECK: no obvious masses on inspection  LUNGS: clear to auscultation bilaterally, no wheezes, rales or rhonchi, good air movement  CV: HRRR, no peripheral edema  MS: moves all extremities without noticeable abnormality  PSYCH: pleasant and cooperative, no obvious depression or anxiety  ASSESSMENT AND PLAN:  Discussed the following assessment and plan:  1. Eustachian tube dysfunction    -likely AR with eustachian tube dysfunction - advised tx of AR and follow up with PCP -Patient advised to return or notify a doctor immediately if symptoms worsen or persist or new concerns arise.  Patient Instructions  -flonase 2 sprays each nostril daily  -calritin daily  -follow up with your doctor in 1 month or sooner if worsening or other concerns     Katie Booker, Katie Client R.

## 2012-02-17 NOTE — Telephone Encounter (Signed)
Opened in error

## 2012-02-25 ENCOUNTER — Ambulatory Visit (INDEPENDENT_AMBULATORY_CARE_PROVIDER_SITE_OTHER): Payer: Medicare Other | Admitting: Surgery

## 2012-02-25 ENCOUNTER — Encounter (INDEPENDENT_AMBULATORY_CARE_PROVIDER_SITE_OTHER): Payer: Self-pay | Admitting: Surgery

## 2012-02-25 VITALS — BP 128/82 | HR 80 | Temp 97.2°F | Resp 16 | Ht 62.0 in | Wt 194.6 lb

## 2012-02-25 DIAGNOSIS — K429 Umbilical hernia without obstruction or gangrene: Secondary | ICD-10-CM | POA: Insufficient documentation

## 2012-02-25 NOTE — Patient Instructions (Signed)
Return as needed

## 2012-02-25 NOTE — Progress Notes (Signed)
Patient ID: Katie Booker, female   DOB: 1945-10-10, 67 y.o.   MRN: [id number]  No chief complaint on file.   HPI Katie Booker is a 67 y.o. female.  Patient returns in followup for her hemorrhoid disease. She does have some difficulty sensing the completion of the bowel movement and whether she has evacuated her rectum completely. She also has hygiene issues which bother her. There is no further bleeding, pain or discharge. She has an umbilical hernia taking larger and causing discomfort. She is taking a high-fiber diet this is helped her bowel function. The hernia is increasing in size but not causing any pain. HPI  Past Medical History  Diagnosis Date  . ANEMIA-NOS 09/18/2009  . DEPRESSION 09/18/2009  . DIABETES MELLITUS, TYPE II 09/18/2009  . GERD 09/18/2009  . HYPERLIPIDEMIA 09/18/2009  . HYPERTENSION 09/18/2009  . HYPOTHYROIDISM 09/18/2009  . OSTEOARTHRITIS 09/18/2009  . Obesity   . OSA (obstructive sleep apnea)   . Cancer   . Hemorrhoids   . Anal fissure     Past Surgical History  Procedure Date  . Tubal fulgaration 1991  . Colonoscopy 2007  . Esophagogastroduodenoscopy 2007  . Tubal ligation   . Hemorrhoid surgery 03/23/2011    Procedure: HEMORRHOIDECTOMY;  Surgeon: Clovis Pu. Karlin Heilman, MD;  Location: [facility];  Service: General;  Laterality: N/A;  lateral internal sphincterotomy and hemorrhoidectomy    Family History  Problem Relation Age of Onset  . Diabetes Father   . COPD Father   . Cancer Maternal Grandfather     stomach    Social History History  Substance Use Topics  . Smoking status: Former Smoker    Quit date: 02/23/2004  . Smokeless tobacco: Not on file     Comment: quit 2007  . Alcohol Use: Yes    No Known Allergies  Current Outpatient Prescriptions  Medication Sig Dispense Refill  . ALPRAZolam (XANAX) 0.5 MG tablet Take 1 tablet (0.5 mg total) by mouth 2 (two) times daily as needed.  60 tablet  2  . atenolol-chlorthalidone  (TENORETIC) 50-25 MG per tablet Take 1 tablet by mouth daily.  90 tablet  3  . atorvastatin (LIPITOR) 40 MG tablet Take 1 tablet (40 mg total) by mouth daily.  90 tablet  3  . Calcium Carbonate-Vit D-Min (GNP CALCIUM 600 PLUS D/MINERAL) 600-400 MG-UNIT TABS Take 1 tablet by mouth 2 (two) times daily.        . citalopram (CELEXA) 40 MG tablet Take 1 tablet (40 mg total) by mouth daily.  90 tablet  6  . fluticasone (FLONASE) 50 MCG/ACT nasal spray Place 2 sprays into the nose daily.      . furosemide (LASIX) 20 MG tablet Take 1 tablet (20 mg total) by mouth daily.  90 tablet  6  . gemfibrozil (LOPID) 600 MG tablet Take 1 tablet (600 mg total) by mouth 2 (two) times daily before a meal.  180 tablet  6  . glimepiride (AMARYL) 4 MG tablet One half tablet daily  90 tablet  3  . glucose blood (ONE TOUCH ULTRA TEST) test strip 1 each by Other route daily. Use as instructed  100 each  6  . Lancets (ONETOUCH ULTRASOFT) lancets Use daily  100 each  6  . LASTACAFT 0.25 % SOLN Daily.      Marland Kitchen levothyroxine (SYNTHROID, LEVOTHROID) 150 MCG tablet Take 1 tablet (150 mcg total) by mouth daily.  90 tablet  6  . metFORMIN (GLUCOPHAGE) 1000  MG tablet Take 1 tablet (1,000 mg total) by mouth 2 (two) times daily with a meal.  180 tablet  6  . Multiple Vitamin (MULTIVITAMIN) tablet Take 1 tablet by mouth daily.        . Multiple Vitamins-Minerals (EYE VITAMINS) TABS Take by mouth daily.        . naproxen sodium (ANAPROX) 550 MG tablet TAKE ONE TABLET BY MOUTH TWICE A DAY WITH MEALS  180 tablet  1  . Omega-3 Fatty Acids (FISH OIL) 1200 MG CAPS Take 2 capsules by mouth 2 (two) times daily.      Marland Kitchen omeprazole (PRILOSEC) 20 MG capsule Take 1 capsule (20 mg total) by mouth daily.  90 capsule  6  . pregabalin (LYRICA) 75 MG capsule Take 1 capsule (75 mg total) by mouth daily.  90 capsule  6  . PSEUDOEPHEDRINE SULFATE PO Take by mouth.      . traZODone (DESYREL) 100 MG tablet Take 1 tablet (100 mg total) by mouth at bedtime.  90  tablet  6  . zolpidem (AMBIEN) 10 MG tablet Take 1 tablet (10 mg total) by mouth at bedtime as needed.  30 tablet  3    Review of Systems Review of Systems  Constitutional: Negative for fever, chills and unexpected weight change.  HENT: Negative for hearing loss, congestion, sore throat, trouble swallowing and voice change.   Eyes: Negative for visual disturbance.  Respiratory: Negative for cough and wheezing.   Cardiovascular: Negative for chest pain, palpitations and leg swelling.  Gastrointestinal: Positive for abdominal pain. Negative for nausea, vomiting, diarrhea, constipation, blood in stool, abdominal distention and anal bleeding.  Genitourinary: Negative for hematuria, vaginal bleeding and difficulty urinating.  Musculoskeletal: Negative for arthralgias.  Skin: Negative for rash and wound.  Neurological: Negative for seizures, syncope and headaches.  Hematological: Negative for adenopathy. Does not bruise/bleed easily.  Psychiatric/Behavioral: Negative for confusion.    Blood pressure 128/82, pulse 80, temperature 97.2 F (36.2 C), resp. rate 16, height 5\' 2"  (1.575 m), weight 194 lb 9.6 oz (88.27 kg).  Physical Exam Physical Exam  Constitutional: She is oriented to person, place, and time. She appears well-developed and well-nourished.  HENT:  Head: Normocephalic and atraumatic.  Eyes: EOM are normal. Pupils are equal, round, and reactive to light.  Neck: Normal range of motion. Neck supple.  Cardiovascular: Normal rate and regular rhythm.   Pulmonary/Chest: Effort normal and breath sounds normal.  Abdominal: Soft. She exhibits no distension. There is no tenderness.    Genitourinary:     Musculoskeletal: Normal range of motion.  Neurological: She is alert and oriented to person, place, and time.  Skin: Skin is warm and dry.      Assessment    Umbilical hernia symptomatic reducible  History of hemorrhoidectomy    Plan   discussed repair of umbilical  hernia with patient. Risks, benefits and alternative therapies discussed. She would like to observe the hernia since it's not causing any pain. We discussed signs of incarceration strangulation. Is no bigger on exam compared to  6 months ago.  The risk of hernia repair include bleeding,  Infection,   Recurrence of the hernia,  Mesh use, chronic pain,  Organ injury,  Bowel injury,  Bladder injury,   nerve injury with numbness around the incision,  Death,  and worsening of preexisting  medical problems.  The alternatives to surgery have been discussed as well..  Long term expectations of both operative and non operative treatments have been  discussed.   The patient would like to hold off on surgical repair for now. As for her sensation issues with her rectum, this should improve with time. If not, she may require further evaluation by  a colorectal specialist.       Damar Petit A. 02/25/2012, 5:31 PM

## 2012-03-06 ENCOUNTER — Other Ambulatory Visit: Payer: Self-pay | Admitting: Internal Medicine

## 2012-04-05 ENCOUNTER — Ambulatory Visit: Payer: Medicare Other | Admitting: Internal Medicine

## 2012-04-11 ENCOUNTER — Encounter: Payer: Self-pay | Admitting: Internal Medicine

## 2012-04-11 ENCOUNTER — Ambulatory Visit (INDEPENDENT_AMBULATORY_CARE_PROVIDER_SITE_OTHER): Payer: Medicare Other | Admitting: Internal Medicine

## 2012-04-11 VITALS — BP 160/86 | HR 96 | Temp 98.4°F | Resp 18 | Wt 194.0 lb

## 2012-04-11 DIAGNOSIS — E785 Hyperlipidemia, unspecified: Secondary | ICD-10-CM

## 2012-04-11 DIAGNOSIS — E119 Type 2 diabetes mellitus without complications: Secondary | ICD-10-CM

## 2012-04-11 DIAGNOSIS — G4733 Obstructive sleep apnea (adult) (pediatric): Secondary | ICD-10-CM

## 2012-04-11 DIAGNOSIS — E039 Hypothyroidism, unspecified: Secondary | ICD-10-CM

## 2012-04-11 DIAGNOSIS — I10 Essential (primary) hypertension: Secondary | ICD-10-CM

## 2012-04-11 LAB — LIPID PANEL
Cholesterol: 193 mg/dL (ref 0–200)
Total CHOL/HDL Ratio: 4
Triglycerides: 278 mg/dL — ABNORMAL HIGH (ref 0.0–149.0)

## 2012-04-11 LAB — HEMOGLOBIN A1C: Hgb A1c MFr Bld: 6.9 % — ABNORMAL HIGH (ref 4.6–6.5)

## 2012-04-11 NOTE — Progress Notes (Signed)
  Subjective:    Patient ID: Katie Booker, female    DOB: Nov 14, 1945, 68 y.o.   MRN: [id number]  HPI  Wt Readings from Last 3 Encounters:  04/11/12 194 lb (87.998 kg)  02/25/12 194 lb 9.6 oz (88.27 kg)  02/17/12 195 lb (88.451 kg)    Review of Systems     Objective:   Physical Exam        Assessment & Plan:

## 2012-04-11 NOTE — Progress Notes (Signed)
  Subjective:    Patient ID: Katie Booker, female    DOB: 07-28-1945, 67 y.o.   MRN: [id number]  HPI   67 year old patient who is seen today for followup. She has a history of type 2 diabetes. Last hemoglobin A1c was well controlled.  She has a history of exogenous obesity as well as significant OS A. She has been intolerant of CPAP in the past; she complains of insomnia and has been on Ambien until recently. She also is on trazodone  She has a history of severe mixed dyslipidemia and fish oil and fibrate added to her regimen last visit. She is taking fish oil very infrequently.    Review of Systems  Constitutional: Negative.   HENT: Negative for hearing loss, congestion, sore throat, rhinorrhea, dental problem, sinus pressure and tinnitus.   Eyes: Negative for pain, discharge and visual disturbance.  Respiratory: Negative for cough and shortness of breath.   Cardiovascular: Negative for chest pain, palpitations and leg swelling.  Gastrointestinal: Negative for nausea, vomiting, abdominal pain, diarrhea, constipation, blood in stool and abdominal distention.  Genitourinary: Negative for dysuria, urgency, frequency, hematuria, flank pain, vaginal bleeding, vaginal discharge, difficulty urinating, vaginal pain and pelvic pain.  Musculoskeletal: Negative for joint swelling, arthralgias and gait problem.  Skin: Negative for rash.  Neurological: Negative for dizziness, syncope, speech difficulty, weakness, numbness and headaches.  Hematological: Negative for adenopathy.  Psychiatric/Behavioral: Positive for sleep disturbance. Negative for behavioral problems, dysphoric mood and agitation. The patient is not nervous/anxious.        Objective:   Physical Exam  Constitutional: She is oriented to person, place, and time. She appears well-developed and well-nourished.  150/80  HENT:  Head: Normocephalic.  Right Ear: External ear normal.  Left Ear: External ear normal.  Mouth/Throat:  Oropharynx is clear and moist.  Eyes: Conjunctivae and EOM are normal. Pupils are equal, round, and reactive to light.  Neck: Normal range of motion. Neck supple. No thyromegaly present.  Cardiovascular: Normal rate, regular rhythm, normal heart sounds and intact distal pulses.   Pulmonary/Chest: Effort normal and breath sounds normal.  Abdominal: Soft. Bowel sounds are normal. She exhibits no mass. There is no tenderness.  Musculoskeletal: Normal range of motion.  Lymphadenopathy:    She has no cervical adenopathy.  Neurological: She is alert and oriented to person, place, and time.  Skin: Skin is warm and dry. No rash noted.  Psychiatric: She has a normal mood and affect. Her behavior is normal.          Assessment & Plan:   Diabetes mellitus. Will check a hemoglobin A1c Hyperlipidemia. We'll check a lipid profile OSA. We'll set up for a pulmonary consult Hypertension. Suboptimal control;  will review next visit may need additional medication. Hopeful to improve as sleep hygiene improved

## 2012-05-02 ENCOUNTER — Encounter: Payer: Self-pay | Admitting: Pulmonary Disease

## 2012-05-02 ENCOUNTER — Ambulatory Visit (INDEPENDENT_AMBULATORY_CARE_PROVIDER_SITE_OTHER): Payer: Medicare Other | Admitting: Pulmonary Disease

## 2012-05-02 VITALS — BP 148/84 | HR 82 | Temp 99.2°F | Ht 62.0 in | Wt 195.0 lb

## 2012-05-02 DIAGNOSIS — G4733 Obstructive sleep apnea (adult) (pediatric): Secondary | ICD-10-CM

## 2012-05-02 NOTE — Progress Notes (Signed)
Subjective:    Patient ID: Katie Booker, female    DOB: 1945-06-08, 67 y.o.   MRN: [id number]  HPI The patient is a 67 year old female who I have been asked to see for management of obstructive sleep apnea.  She was diagnosed in 2009 with severe sleep apnea by her history, and was tried on CPAP.  She had very poor tolerance due to claustrophobia and also mask fitting issues.  She tried a nasal mask with chin strap, but could not keep her mouth closed.  Despite multiple attempts to help with desensitization, she quit using her CPAP because of intolerance.  Her sleep apnea has been untreated since that time, and she continues to have snoring as well as frequent awakenings during the night.  She is not rested in the mornings upon arising.  She also has an issue with chronic pain during the night.  She notes significant sleep pressure during the day with periods of inactivity, but is unable to take a nap if she tries to do so.  She has no issues driving short distances, and she is not able to drive longer distances because of chronic pain.  She states that her weight is neutral over the last 2 years, and her Epworth score today is 7.  Sleep Questionnaire What time do you typically go to bed?( Between what hours) 11p-12a 11p-12a at 1501 on 05/02/12 by Nita Sells, CMA How long does it take you to fall asleep? within minutes d/t meds(Ambien, xanax and trazodone 100mg ) within minutes d/t meds(Ambien, xanax and trazodone 100mg ) at 1501 on 05/02/12 by Marjo Bicker Mabe, CMA How many times during the night do you wake up? 4 4 at 1501 on 05/02/12 by Nita Sells, CMA What time do you get out of bed to start your day? [id number] [id number] at 1501 on 05/02/12 by Nita Sells, CMA Do you drive or operate heavy machinery in your occupation? No No at 1501 on 05/02/12 by Nita Sells, CMA How much has your weight changed (up or down) over the past two years? (In pounds) Have you ever had a sleep study before?  Yes Yes at 1501 on 05/02/12 by Marjo Bicker Mabe, CMA If yes, location of study? [facility] at 1501 on 05/02/12 by Nita Sells, CMA If yes, date of study? 2009 x 3 2009 x 3 at 1501 on 05/02/12 by Nita Sells, CMA Do you currently use CPAP? No No at 1501 on 05/02/12 by Marjo Bicker Mabe, CMA Do you wear oxygen at any time? No No at 1501 on 05/02/12 by Marjo Bicker Mabe, CMA    Review of Systems  Constitutional: Negative for fever and unexpected weight change.  HENT: Positive for congestion, rhinorrhea and postnasal drip. Negative for ear pain, nosebleeds, sore throat, sneezing, trouble swallowing, dental problem and sinus pressure.   Eyes: Negative for redness and itching.  Respiratory: Positive for cough, shortness of breath and wheezing. Negative for chest tightness.   Cardiovascular: Positive for leg swelling. Negative for palpitations.  Gastrointestinal: Negative for nausea and vomiting.  Genitourinary: Positive for frequency. Negative for dysuria.  Musculoskeletal: Negative for joint swelling.  Skin: Negative for rash.  Neurological: Positive for headaches.  Hematological: Does not bruise/bleed easily.  Psychiatric/Behavioral: Positive for dysphoric mood. The patient is nervous/anxious.        Objective:   Physical Exam Constitutional:  Overweight female, no acute distress  HENT:  Nares patent without discharge, but narrowed bilat.  Oropharynx without exudate, palate and uvula are thick and elongated.   Eyes:  Perrla, eomi, no scleral icterus  Neck:  No JVD, no TMG  Cardiovascular:  Normal rate, regular rhythm, no rubs or gallops.  No murmurs        Intact distal pulses  Pulmonary :  Normal breath sounds, no stridor or respiratory distress   No rales, rhonchi, or wheezing  Abdominal:  Soft, nondistended, bowel sounds present.  No tenderness noted.   Musculoskeletal:  mild lower extremity edema noted.  Lymph Nodes:  No cervical lymphadenopathy  noted  Skin:  No cyanosis noted  Neurologic:  Alert, appropriate, moves all 4 extremities without obvious deficit.         Assessment & Plan:

## 2012-05-02 NOTE — Assessment & Plan Note (Signed)
The patient has a history of severe obstructive sleep apnea, and has been completely intolerant of CPAP despite aggressive desensitization measures.  She has never been tried on bilevel.  She continues to have disrupted sleep and daytime sleepiness, and feels that she is miserable from a quality of life standpoint.  She is willing to try a positive pressure device again, and I would like to try BiPAP this time around.  We'll also try and fit her with a low profile full face mask, which should help minimize claustrophobia.  If she continues to have issues, we could consider dental appliance, but she understands this may not completely treat her degree of obstructive sleep apnea.

## 2012-05-02 NOTE — Patient Instructions (Addendum)
Will try bilevel with a low pressure to see if you can tolerate Will arrange a fitting at sleep center for a full face mask that is low profile. Try to wear the mask for 20-36min during the day to help with desensitization. Would like to see you back in 5 weeks, but call me if you are having tolerance issues.

## 2012-05-03 ENCOUNTER — Telehealth: Payer: Self-pay | Admitting: *Deleted

## 2012-05-03 NOTE — Telephone Encounter (Signed)
Records requested from [facility] (Sleep Study) have been placed in your folder for review.

## 2012-05-08 ENCOUNTER — Ambulatory Visit (HOSPITAL_BASED_OUTPATIENT_CLINIC_OR_DEPARTMENT_OTHER): Payer: Medicare Other | Attending: Pulmonary Disease | Admitting: Radiology

## 2012-05-08 DIAGNOSIS — R0683 Snoring: Secondary | ICD-10-CM

## 2012-05-08 DIAGNOSIS — G4733 Obstructive sleep apnea (adult) (pediatric): Secondary | ICD-10-CM

## 2012-05-30 ENCOUNTER — Other Ambulatory Visit: Payer: Self-pay | Admitting: Internal Medicine

## 2012-06-02 ENCOUNTER — Telehealth: Payer: Self-pay | Admitting: Internal Medicine

## 2012-06-02 MED ORDER — PREGABALIN 75 MG PO CAPS: 75.0000 mg | ORAL_CAPSULE | Freq: Every day | ORAL | Status: DC

## 2012-06-02 MED ORDER — ZOLPIDEM TARTRATE 10 MG PO TABS: 10.0000 mg | ORAL_TABLET | Freq: Every evening | ORAL | Status: DC | PRN

## 2012-06-02 MED ORDER — ALPRAZOLAM 0.5 MG PO TABS: 0.5000 mg | ORAL_TABLET | Freq: Two times a day (BID) | ORAL | Status: DC | PRN

## 2012-06-02 NOTE — Telephone Encounter (Signed)
Pt notified Rx's refills faxed to pharmacy. Spoke to AJ at [employer] and he received the fax.

## 2012-06-02 NOTE — Telephone Encounter (Signed)
Pt needs refills  on generic ambien 10 mg #30,LYRICA 75 MG #90 and alprazolam 0.5mg  #[facility] [address]

## 2012-06-08 ENCOUNTER — Telehealth: Payer: Self-pay | Admitting: Pulmonary Disease

## 2012-06-08 ENCOUNTER — Other Ambulatory Visit: Payer: Self-pay | Admitting: Internal Medicine

## 2012-06-08 NOTE — Telephone Encounter (Signed)
I spoke with pt and advised her it shows we sent order over on 05/09/12. [facility] is closed. Pt aware we are closed tomorrow and we will f/u on this on Monday. Please advise PCC's thanks

## 2012-06-12 NOTE — Telephone Encounter (Signed)
Spoke to Katie Booker  she dida call pt and give her all the financial info and casey the RT is going to call and set up time to set her up Tobe Sos

## 2012-06-13 ENCOUNTER — Ambulatory Visit: Payer: Medicare Other | Admitting: Pulmonary Disease

## 2012-06-17 ENCOUNTER — Encounter: Payer: Self-pay | Admitting: Pulmonary Disease

## 2012-07-07 ENCOUNTER — Telehealth: Payer: Self-pay | Admitting: Internal Medicine

## 2012-07-07 MED ORDER — LISINOPRIL 10 MG PO TABS: 10.0000 mg | ORAL_TABLET | Freq: Every day | ORAL | Status: DC

## 2012-07-07 NOTE — Telephone Encounter (Signed)
Lisinopril 10 mg #90 one daily

## 2012-07-07 NOTE — Telephone Encounter (Signed)
Pharm from (main office [facility]) concerned that since pt is a diabetic and has hypertension it is recommended pt be on an ARB or ace inhibitor .Does MD want to make these changes?  They have sent fax concerning but have not heard back.[place] Dec w/ [facility] in [place] working w/ pt's insurance company: Direct line:  [phone]  Ext [phone]

## 2012-07-07 NOTE — Telephone Encounter (Signed)
Spoke to pt told her new Rx for Lisinopril 10 mg one tablet by mouth daily was sent to pharmacy for her. Told pt we were contacted by pharmacy due to insurance and that because she has diabetes and hypertension need to be on ARB or ace inhibitor so Dr.Kwiatkowski has decided to put you on low dose of Lisinopril. Pt verbalized understanding.

## 2012-07-19 ENCOUNTER — Encounter: Payer: Self-pay | Admitting: Pulmonary Disease

## 2012-07-19 ENCOUNTER — Ambulatory Visit (INDEPENDENT_AMBULATORY_CARE_PROVIDER_SITE_OTHER): Payer: Medicare Other | Admitting: Pulmonary Disease

## 2012-07-19 VITALS — BP 102/50 | HR 73 | Temp 97.7°F | Ht 62.25 in | Wt 193.0 lb

## 2012-07-19 DIAGNOSIS — G4733 Obstructive sleep apnea (adult) (pediatric): Secondary | ICD-10-CM

## 2012-07-19 NOTE — Patient Instructions (Addendum)
Wear bipap in your family room watching tv for 30-58min in morning and afternoon hours for about a week.  Once you are more comfortable with it, move device to bedroom and try wearing at night. If you are having issues, please call me If you are doing well, I need to see you back in 6 weeks.

## 2012-07-19 NOTE — Progress Notes (Signed)
  Subjective:    Patient ID: Katie Booker, female    DOB: 1945/03/28, 67 y.o.   MRN: [id number]  HPI Patient comes in today for followup of her obstructive sleep apnea.  She was started on bilevel at the last visit because of complete CPAP intolerance, but has struggled with mask fit and therefore has not initiated therapy as of yet.  She thinks she has found a mask that fits fairly well, but has had a psychological barrier to even starting her positive pressure device.   Review of Systems  Constitutional: Negative for fever and unexpected weight change.  HENT: Negative for ear pain, nosebleeds, congestion, sore throat, rhinorrhea, sneezing, trouble swallowing, dental problem, postnasal drip and sinus pressure.   Eyes: Negative for redness and itching.  Respiratory: Negative for cough, chest tightness, shortness of breath and wheezing.   Cardiovascular: Negative for palpitations and leg swelling.  Gastrointestinal: Negative for nausea and vomiting.  Genitourinary: Negative for dysuria.  Musculoskeletal: Negative for joint swelling.  Skin: Negative for rash.  Neurological: Negative for headaches.  Hematological: Does not bruise/bleed easily.  Psychiatric/Behavioral: Negative for dysphoric mood. The patient is not nervous/anxious.        Objective:   Physical Exam Obese female in no acute distress Nose without purulence or discharge noted No skin breakdown or pressure necrosis from the CPAP mask Neck without lymphadenopathy or thyromegaly Lower extremities with mild edema, no cyanosis Alert and oriented, moves all 4 extremities.       Assessment & Plan:

## 2012-07-19 NOTE — Assessment & Plan Note (Signed)
The patient continues to struggle with treatment of her obstructive sleep apnea, but we have had a good discussion today about how to start.  She has at least found a mass that feels fairly comfortable and is not overly confining.  I have asked her to start out wearing the bilevel for 30-60 minutes during the day in the mornings and afternoons while she is awake and watching television or reading.  That will allow for desensitization.  Once she is comfortable with this, I would like her to start wearing it at night while sleeping, and to call me if she has ongoing issues.  I have also stressed to her how severe her sleep apnea is, and the significant impact he can have on her overall health.

## 2012-07-26 ENCOUNTER — Telehealth: Payer: Self-pay | Admitting: Internal Medicine

## 2012-07-26 NOTE — Telephone Encounter (Signed)
Pt requested a new RX for a 3 month supply of her One Touch ultra test strips. She would [facility] to [facility] in [place]. Please assist.

## 2012-07-27 ENCOUNTER — Other Ambulatory Visit: Payer: Self-pay | Admitting: Family Medicine

## 2012-07-27 MED ORDER — GLUCOSE BLOOD VI STRP: 1.0000 | ORAL_STRIP | Freq: Every day | Status: DC

## 2012-07-27 NOTE — Telephone Encounter (Signed)
Sent by [facility]. 

## 2012-08-04 ENCOUNTER — Encounter: Payer: Self-pay | Admitting: Internal Medicine

## 2012-08-04 ENCOUNTER — Ambulatory Visit (INDEPENDENT_AMBULATORY_CARE_PROVIDER_SITE_OTHER): Payer: Medicare Other | Admitting: Internal Medicine

## 2012-08-04 VITALS — BP 120/70 | HR 71 | Temp 98.4°F | Resp 20 | Wt 194.0 lb

## 2012-08-04 DIAGNOSIS — R011 Cardiac murmur, unspecified: Secondary | ICD-10-CM

## 2012-08-04 DIAGNOSIS — E669 Obesity, unspecified: Secondary | ICD-10-CM

## 2012-08-04 DIAGNOSIS — E119 Type 2 diabetes mellitus without complications: Secondary | ICD-10-CM

## 2012-08-04 DIAGNOSIS — I10 Essential (primary) hypertension: Secondary | ICD-10-CM

## 2012-08-04 DIAGNOSIS — R0989 Other specified symptoms and signs involving the circulatory and respiratory systems: Secondary | ICD-10-CM

## 2012-08-04 DIAGNOSIS — R0609 Other forms of dyspnea: Secondary | ICD-10-CM

## 2012-08-04 DIAGNOSIS — E6609 Other obesity due to excess calories: Secondary | ICD-10-CM

## 2012-08-04 DIAGNOSIS — R0600 Dyspnea, unspecified: Secondary | ICD-10-CM

## 2012-08-04 DIAGNOSIS — D649 Anemia, unspecified: Secondary | ICD-10-CM

## 2012-08-04 DIAGNOSIS — M1990 Unspecified osteoarthritis, unspecified site: Secondary | ICD-10-CM

## 2012-08-04 LAB — CBC WITH DIFFERENTIAL/PLATELET
Basophils Absolute: 0.1 10*3/uL (ref 0.0–0.1)
Eosinophils Absolute: 0.3 10*3/uL (ref 0.0–0.7)
HCT: 30.6 % — ABNORMAL LOW (ref 36.0–46.0)
Hemoglobin: 9.6 g/dL — ABNORMAL LOW (ref 12.0–15.0)
Lymphocytes Relative: 31.8 % (ref 12.0–46.0)
Lymphs Abs: 2.1 10*3/uL (ref 0.7–4.0)
MCHC: 31.2 g/dL (ref 30.0–36.0)
Neutro Abs: 3.5 10*3/uL (ref 1.4–7.7)
RDW: 19.1 % — ABNORMAL HIGH (ref 11.5–14.6)

## 2012-08-04 LAB — COMPREHENSIVE METABOLIC PANEL
AST: 20 U/L (ref 0–37)
Alkaline Phosphatase: 57 U/L (ref 39–117)
BUN: 23 mg/dL (ref 6–23)
Creatinine, Ser: 1 mg/dL (ref 0.4–1.2)

## 2012-08-04 LAB — CK: Total CK: 202 U/L — ABNORMAL HIGH (ref 7–177)

## 2012-08-04 LAB — HEMOGLOBIN A1C: Hgb A1c MFr Bld: 6.6 % — ABNORMAL HIGH (ref 4.6–6.5)

## 2012-08-04 MED ORDER — ATENOLOL 25 MG PO TABS: 25.0000 mg | ORAL_TABLET | Freq: Every day | ORAL | Status: DC

## 2012-08-04 MED ORDER — GLIMEPIRIDE 2 MG PO TABS: 1.0000 mg | ORAL_TABLET | Freq: Every day | ORAL | Status: DC

## 2012-08-04 NOTE — Patient Instructions (Signed)
Discontinue atenolol-chlorthalidone 50-25  Atenolol 25 mg daily  Discontinue furosemide    It is important that you exercise regularly, at least 20 minutes 3 to 4 times per week.  If you develop chest pain or shortness of breath seek  medical attention.  You need to lose weight.  Consider a lower calorie diet and regular exercise.  2-D echocardiogram as discussed  Recheck 1 month or sooner if needed (will consider lumbar MRI)

## 2012-08-04 NOTE — Progress Notes (Signed)
Subjective:    Patient ID: Katie Booker, female    DOB: 1945-09-28, 67 y.o.   MRN: [id number]  HPI  67 year old patient who is seen today for her quarterly followup. She has type 2 diabetes that has been recently well-controlled. She has exogenous obesity. She has been followed by pulmonary medicine do to OSA.  Today she complains of difficulty with walking. She describes back hip and upper leg discomfort. She describes weakness and numbness in the legs. Symptoms have been present for at least 6 months and she feels have worsened over this period of time. She also describes some more dyspnea on exertion. She states that she has had gait instability over the past 6 months but has had no significant falls.  She does have a history of osteoarthritis.  The patient was seen here 4 months ago but did not voice these complaints at that time  More recently the patient has had some low blood pressure readings on her present regimen  She also describes infrequent episodes of low blood sugar associated with weakness and confusion  Past Medical History  Diagnosis Date  . ANEMIA-NOS 09/18/2009  . DEPRESSION 09/18/2009  . DIABETES MELLITUS, TYPE II 09/18/2009  . GERD 09/18/2009  . HYPERLIPIDEMIA 09/18/2009  . HYPERTENSION 09/18/2009  . HYPOTHYROIDISM 09/18/2009  . OSTEOARTHRITIS 09/18/2009  . Obesity   . OSA (obstructive sleep apnea)   . Cancer   . Hemorrhoids   . Anal fissure     History   Social History  . Marital Status: Divorced    Spouse Name: N/A    Number of Children: N/A  . Years of Education: N/A   Occupational History  . retired    Social History Main Topics  . Smoking status: Former Smoker -- 1.00 packs/day for 20 years    Types: Cigarettes    Quit date: 02/23/2004  . Smokeless tobacco: Not on file     Comment: quit 2007  . Alcohol Use: Yes     Comment: rare  . Drug Use: No  . Sexually Active: Not on file   Other Topics Concern  . Not on file   Social History  Narrative  . No narrative on file    Past Surgical History  Procedure Laterality Date  . Tubal fulgaration  1991  . Colonoscopy  2007  . Esophagogastroduodenoscopy  2007  . Tubal ligation    . Hemorrhoid surgery  03/23/2011    Procedure: HEMORRHOIDECTOMY;  Surgeon: Clovis Pu. Cornett, MD;  Location: [facility];  Service: General;  Laterality: N/A;  lateral internal sphincterotomy and hemorrhoidectomy    Family History  Problem Relation Age of Onset  . Diabetes Father   . COPD Father   . Cancer Maternal Grandfather     stomach    No Known Allergies  Current Outpatient Prescriptions on File Prior to Visit  Medication Sig Dispense Refill  . Alcaftadine (LASTACAFT) 0.25 % SOLN Apply 1 drop to eye daily.      Marland Kitchen ALPRAZolam (XANAX) 0.5 MG tablet Take 1 tablet (0.5 mg total) by mouth 2 (two) times daily as needed.  60 tablet  3  . atenolol-chlorthalidone (TENORETIC) 50-25 MG per tablet TAKE ONE TABLET BY MOUTH ONE TIME DAILY  90 tablet  1  . atorvastatin (LIPITOR) 40 MG tablet Take 1 tablet (40 mg total) by mouth daily.  90 tablet  3  . Calcium Carbonate-Vit D-Min (GNP CALCIUM 600 PLUS D/MINERAL) 600-400 MG-UNIT TABS Take 1 tablet by mouth  2 (two) times daily.        . citalopram (CELEXA) 40 MG tablet Take 1 tablet (40 mg total) by mouth daily.  90 tablet  6  . furosemide (LASIX) 20 MG tablet Take 1 tablet (20 mg total) by mouth daily.  90 tablet  6  . gemfibrozil (LOPID) 600 MG tablet Take 1 tablet (600 mg total) by mouth 2 (two) times daily before a meal.  180 tablet  6  . glimepiride (AMARYL) 4 MG tablet One half tablet daily  90 tablet  3  . glucose blood (ONE TOUCH ULTRA TEST) test strip 1 each by Other route daily. Use as instructed  100 each  6  . Lancets (ONETOUCH ULTRASOFT) lancets Use daily  100 each  6  . levothyroxine (SYNTHROID, LEVOTHROID) 150 MCG tablet Take 1 tablet (150 mcg total) by mouth daily.  90 tablet  6  . lisinopril (PRINIVIL,ZESTRIL) 10 MG tablet  Take 1 tablet (10 mg total) by mouth daily.  90 tablet  1  . Loratadine (CLARITIN PO) Take 1 tablet by mouth as needed.      . metFORMIN (GLUCOPHAGE) 1000 MG tablet Take 1 tablet (1,000 mg total) by mouth 2 (two) times daily with a meal.  180 tablet  6  . Multiple Vitamin (MULTIVITAMIN) tablet Take 1 tablet by mouth daily.        . Multiple Vitamins-Minerals (EYE VITAMINS) TABS Take by mouth daily.        . naproxen sodium (ANAPROX) 550 MG tablet TAKE ONE TABLET BY MOUTH TWICE A DAY WITH MEALS  180 tablet  1  . Omega-3 Fatty Acids (FISH OIL) 1200 MG CAPS Take 2 capsules by mouth 2 (two) times daily.      Marland Kitchen omeprazole (PRILOSEC) 20 MG capsule Take 1 capsule (20 mg total) by mouth daily.  90 capsule  6  . pregabalin (LYRICA) 75 MG capsule Take 1 capsule (75 mg total) by mouth daily.  90 capsule  3  . traZODone (DESYREL) 100 MG tablet Take 1 tablet (100 mg total) by mouth at bedtime.  90 tablet  6  . zolpidem (AMBIEN) 10 MG tablet Take 1 tablet (10 mg total) by mouth at bedtime as needed.  30 tablet  3   No current facility-administered medications on file prior to visit.    BP 120/70  Pulse 71  Temp(Src) 98.4 F (36.9 C) (Oral)  Resp 20  Wt 194 lb (87.998 kg)  BMI 35.21 kg/m2  SpO2 96%       Review of Systems  HENT: Negative for hearing loss, congestion, sore throat, rhinorrhea, dental problem, sinus pressure and tinnitus.   Eyes: Negative for pain, discharge and visual disturbance.  Respiratory: Positive for shortness of breath. Negative for cough.   Cardiovascular: Negative for chest pain, palpitations and leg swelling.  Gastrointestinal: Negative for nausea, vomiting, abdominal pain, diarrhea, constipation, blood in stool and abdominal distention.  Genitourinary: Negative for dysuria, urgency, frequency, hematuria, flank pain, vaginal bleeding, vaginal discharge, difficulty urinating, vaginal pain and pelvic pain.  Musculoskeletal: Positive for back pain, arthralgias and gait  problem. Negative for joint swelling.  Skin: Negative for rash.  Neurological: Positive for weakness and numbness. Negative for dizziness, syncope, speech difficulty and headaches.  Hematological: Negative for adenopathy.  Psychiatric/Behavioral: Negative for behavioral problems, dysphoric mood and agitation. The patient is not nervous/anxious.        Objective:   Physical Exam  Constitutional: She is oriented to person, place, and time.  She appears well-developed and well-nourished.  Repeat blood pressure 104/60  HENT:  Head: Normocephalic.  Right Ear: External ear normal.  Left Ear: External ear normal.  Mouth/Throat: Oropharynx is clear and moist.  Eyes: Conjunctivae and EOM are normal. Pupils are equal, round, and reactive to light.  Neck: Normal range of motion. Neck supple. No thyromegaly present.  Cardiovascular: Normal rate, regular rhythm and intact distal pulses.   Murmur heard. Grade 2/6 systolic murmur at the base  Pulmonary/Chest: Effort normal and breath sounds normal.  Abdominal: Soft. Bowel sounds are normal. She exhibits no mass. There is no tenderness.  Musculoskeletal: Normal range of motion.  Able to stand on her toes and heels  Lymphadenopathy:    She has no cervical adenopathy.  Neurological: She is alert and oriented to person, place, and time. Coordination normal.  No sensory abnormalities  Skin: Skin is warm and dry. No rash noted.  Psychiatric: She has a normal mood and affect. Her behavior is normal.          Assessment & Plan:    Diabetes mellitus. Will check a hemoglobin A1c and decrease the Amaryl to 1 mg daily Hypertension. Blood pressure has been running low we'll discontinue Tenoretic 50-25 and substitute atenolol 25 mg daily. We'll recheck in 4 weeks to consider discontinuation after further tapering and at that time OSA stable History of back pain unsteady gait leg pain and numbness with walking; options discussed including lumbar MRI to  exclude spinal stenosis or other pathology DOE. Patient has a grade 2/6 systolic murmur at the base. We'll check a 2-D echocardiogram to rule out aortic stenosis  Recheck 4 weeks Weight loss increase activity encouraged. Certainly component of deconditioning may be a factor as well as hypotension

## 2012-08-07 ENCOUNTER — Telehealth: Payer: Self-pay | Admitting: Internal Medicine

## 2012-08-07 NOTE — Telephone Encounter (Signed)
Spoke to pt told her the reason she can not take Naproxen or anti-inflammatories is because it thins the blood and they stop them to see if it helps with blood count. Pt verbalized understanding. Told pt she can take Tylenol and ask Dr. Kirtland Bouchard what else she can take at her follow up. Pt verbalized understanding.

## 2012-08-07 NOTE — Telephone Encounter (Signed)
Pt called to set up the 1wk recheck and repeat CBC. She states that without the naproxen, she is in quite a bit of pain. She is wondering the reason for stopping it. Can it be replaced w/something else? Will she be restarting it sometime? Is there anything else she can take? Please advise and call.  Also, can you please put it the CBC order for me? Thank you.

## 2012-08-09 ENCOUNTER — Encounter: Payer: Self-pay | Admitting: Internal Medicine

## 2012-08-11 ENCOUNTER — Ambulatory Visit (INDEPENDENT_AMBULATORY_CARE_PROVIDER_SITE_OTHER): Payer: Medicare Other | Admitting: Internal Medicine

## 2012-08-11 ENCOUNTER — Telehealth: Payer: Self-pay | Admitting: Internal Medicine

## 2012-08-11 ENCOUNTER — Encounter: Payer: Self-pay | Admitting: Internal Medicine

## 2012-08-11 VITALS — BP 120/70 | HR 87 | Temp 98.4°F | Resp 20 | Wt 190.0 lb

## 2012-08-11 DIAGNOSIS — I10 Essential (primary) hypertension: Secondary | ICD-10-CM

## 2012-08-11 DIAGNOSIS — M549 Dorsalgia, unspecified: Secondary | ICD-10-CM

## 2012-08-11 DIAGNOSIS — E119 Type 2 diabetes mellitus without complications: Secondary | ICD-10-CM

## 2012-08-11 DIAGNOSIS — D649 Anemia, unspecified: Secondary | ICD-10-CM

## 2012-08-11 MED ORDER — OMEPRAZOLE 20 MG PO CPDR: 20.0000 mg | DELAYED_RELEASE_CAPSULE | Freq: Two times a day (BID) | ORAL | Status: DC

## 2012-08-11 MED ORDER — TRAMADOL HCL 50 MG PO TABS: 50.0000 mg | ORAL_TABLET | Freq: Three times a day (TID) | ORAL | Status: DC | PRN

## 2012-08-11 NOTE — Progress Notes (Signed)
Subjective:    Patient ID: Katie Booker, female    DOB: 19-Oct-1945, 67 y.o.   MRN: [id number]  HPI  67 year old patient who is seen today for followup of microcytic anemia. The patient was seen one week ago complaining of increasing weakness and this did not exertion. Laboratory studies revealed significant anemia consistent with chronic blood loss. She has had intermittent anemia over the years. In 2007 she underwent upper and lower endoscopy with negative results. Last week naproxen therapy was discontinued she continues to have chronic low back pain associated with leg pain and paresthesias ; this continues to interfere with daily activities. She states she has a very difficult time shopping and is unable to walk without the assistance of a shopping cart. Today she feels considerably improved with much more energy and general sense of well-being. Amaryl was down titrated and furosemide discontinued. Hydrochlorothiazide was discontinued and Tenoretic was decreased to 25 mg daily. She is on Protonix twice a day along with twice a day iron supplementation  Past Medical History  Diagnosis Date  . ANEMIA-NOS 09/18/2009  . DEPRESSION 09/18/2009  . DIABETES MELLITUS, TYPE II 09/18/2009  . GERD 09/18/2009  . HYPERLIPIDEMIA 09/18/2009  . HYPERTENSION 09/18/2009  . HYPOTHYROIDISM 09/18/2009  . OSTEOARTHRITIS 09/18/2009  . Obesity   . OSA (obstructive sleep apnea)   . Cancer   . Hemorrhoids   . Anal fissure     History   Social History  . Marital Status: Divorced    Spouse Name: N/A    Number of Children: N/A  . Years of Education: N/A   Occupational History  . retired    Social History Main Topics  . Smoking status: Former Smoker -- 1.00 packs/day for 20 years    Types: Cigarettes    Quit date: 02/23/2004  . Smokeless tobacco: Not on file     Comment: quit 2007  . Alcohol Use: Yes     Comment: rare  . Drug Use: No  . Sexually Active: Not on file   Other Topics Concern  . Not on  file   Social History Narrative  . No narrative on file    Past Surgical History  Procedure Laterality Date  . Tubal fulgaration  1991  . Colonoscopy  2007  . Esophagogastroduodenoscopy  2007  . Tubal ligation    . Hemorrhoid surgery  03/23/2011    Procedure: HEMORRHOIDECTOMY;  Surgeon: Clovis Pu. Cornett, MD;  Location: [facility];  Service: General;  Laterality: N/A;  lateral internal sphincterotomy and hemorrhoidectomy    Family History  Problem Relation Age of Onset  . Diabetes Father   . COPD Father   . Cancer Maternal Grandfather     stomach    No Known Allergies  Current Outpatient Prescriptions on File Prior to Visit  Medication Sig Dispense Refill  . Alcaftadine (LASTACAFT) 0.25 % SOLN Apply 1 drop to eye daily.      Marland Kitchen ALPRAZolam (XANAX) 0.5 MG tablet Take 1 tablet (0.5 mg total) by mouth 2 (two) times daily as needed.  60 tablet  3  . atenolol (TENORMIN) 25 MG tablet Take 1 tablet (25 mg total) by mouth daily.  90 tablet  3  . atorvastatin (LIPITOR) 40 MG tablet Take 1 tablet (40 mg total) by mouth daily.  90 tablet  3  . Calcium Carbonate-Vit D-Min (GNP CALCIUM 600 PLUS D/MINERAL) 600-400 MG-UNIT TABS Take 1 tablet by mouth 2 (two) times daily.        Marland Kitchen  citalopram (CELEXA) 40 MG tablet Take 1 tablet (40 mg total) by mouth daily.  90 tablet  6  . gemfibrozil (LOPID) 600 MG tablet Take 1 tablet (600 mg total) by mouth 2 (two) times daily before a meal.  180 tablet  6  . glimepiride (AMARYL) 2 MG tablet Take 0.5 tablets (1 mg total) by mouth daily before breakfast.  30 tablet  3  . glucose blood (ONE TOUCH ULTRA TEST) test strip 1 each by Other route daily. Use as instructed  100 each  6  . Lancets (ONETOUCH ULTRASOFT) lancets Use daily  100 each  6  . levothyroxine (SYNTHROID, LEVOTHROID) 150 MCG tablet Take 1 tablet (150 mcg total) by mouth daily.  90 tablet  6  . lisinopril (PRINIVIL,ZESTRIL) 10 MG tablet Take 1 tablet (10 mg total) by mouth daily.  90  tablet  1  . Loratadine (CLARITIN PO) Take 1 tablet by mouth as needed.      . metFORMIN (GLUCOPHAGE) 1000 MG tablet Take 1 tablet (1,000 mg total) by mouth 2 (two) times daily with a meal.  180 tablet  6  . Multiple Vitamin (MULTIVITAMIN) tablet Take 1 tablet by mouth daily.        . Multiple Vitamins-Minerals (EYE VITAMINS) TABS Take by mouth daily.        . naproxen sodium (ANAPROX) 550 MG tablet TAKE ONE TABLET BY MOUTH TWICE A DAY WITH MEALS  180 tablet  1  . Omega-3 Fatty Acids (FISH OIL) 1200 MG CAPS Take 2 capsules by mouth 2 (two) times daily.      . pregabalin (LYRICA) 75 MG capsule Take 1 capsule (75 mg total) by mouth daily.  90 capsule  3  . traZODone (DESYREL) 100 MG tablet Take 1 tablet (100 mg total) by mouth at bedtime.  90 tablet  6  . zolpidem (AMBIEN) 10 MG tablet Take 1 tablet (10 mg total) by mouth at bedtime as needed.  30 tablet  3   No current facility-administered medications on file prior to visit.    BP 120/70  Pulse 87  Temp(Src) 98.4 F (36.9 C) (Oral)  Resp 20  Wt 190 lb (86.183 kg)  BMI 34.48 kg/m2  SpO2 97%       Review of Systems  Constitutional: Positive for fatigue.  HENT: Negative for hearing loss, congestion, sore throat, rhinorrhea, dental problem, sinus pressure and tinnitus.   Eyes: Negative for pain, discharge and visual disturbance.  Respiratory: Negative for cough and shortness of breath.   Cardiovascular: Negative for chest pain, palpitations and leg swelling.  Gastrointestinal: Negative for nausea, vomiting, abdominal pain, diarrhea, constipation, blood in stool and abdominal distention.  Genitourinary: Negative for dysuria, urgency, frequency, hematuria, flank pain, vaginal bleeding, vaginal discharge, difficulty urinating, vaginal pain and pelvic pain.  Musculoskeletal: Positive for back pain and gait problem. Negative for joint swelling and arthralgias.  Skin: Negative for rash.  Neurological: Positive for weakness. Negative for  dizziness, syncope, speech difficulty, numbness and headaches.  Hematological: Negative for adenopathy.  Psychiatric/Behavioral: Negative for behavioral problems, dysphoric mood and agitation. The patient is not nervous/anxious.        Objective:   Physical Exam  Constitutional: She is oriented to person, place, and time. She appears well-developed and well-nourished.  HENT:  Head: Normocephalic.  Right Ear: External ear normal.  Left Ear: External ear normal.  Mouth/Throat: Oropharynx is clear and moist.  Eyes: Conjunctivae and EOM are normal. Pupils are equal, round, and reactive to  light.  Neck: Normal range of motion. Neck supple. No thyromegaly present.  Cardiovascular: Normal rate, regular rhythm and intact distal pulses.   Murmur heard. Grade 1-2/6 systolic murmur at the primary aortic area  Pulmonary/Chest: Effort normal and breath sounds normal.  Abdominal: Soft. Bowel sounds are normal. She exhibits no mass. There is no tenderness.  Musculoskeletal: Normal range of motion.  Lymphadenopathy:    She has no cervical adenopathy.  Neurological: She is alert and oriented to person, place, and time.  Skin: Skin is warm and dry. No rash noted.  Psychiatric: She has a normal mood and affect. Her behavior is normal.          Assessment & Plan:   Moderately severe anemia. Clinically improved we'll continue off naproxen and continue twice a day Prilosec. We'll check a CBC Low back pain with leg pain and numbness. Consistent with neurogenic claudication. Will schedule MRI to exclude significant lumbar spinal stenosis Hypertension. Still well controlled on less aggressive therapy Diabetes stable

## 2012-08-11 NOTE — Telephone Encounter (Signed)
Discussed with Dr. Amador Cunas and he said okay for pt to have Tramadol at low risk. Called pharmacy and spoke to [place] told him okay to dispense Tramadol per Dr. Amador Cunas. Thayer Ohm verbalized understanding.

## 2012-08-11 NOTE — Telephone Encounter (Signed)
Pharm called w/ this pt's drug interaction traMADol (ULTRAM) 50 MG tablet citalopram (CELEXA) 40 MG tablet Increased risk of seizures and serotonin syndrome. Pls advise.

## 2012-08-11 NOTE — Patient Instructions (Addendum)
Lumbar MRI as discussed  Return in one month for followup

## 2012-08-12 LAB — CBC WITH DIFFERENTIAL/PLATELET
Basophils Absolute: 0 10*3/uL (ref 0.0–0.1)
Basophils Relative: 0.6 % (ref 0.0–3.0)
Hemoglobin: 10.3 g/dL — ABNORMAL LOW (ref 12.0–15.0)
Lymphocytes Relative: 21.7 % (ref 12.0–46.0)
Monocytes Relative: 5.4 % (ref 3.0–12.0)
Neutro Abs: 6.2 10*3/uL (ref 1.4–7.7)
RBC: 4.57 Mil/uL (ref 3.87–5.11)
RDW: 19.4 % — ABNORMAL HIGH (ref 11.5–14.6)
WBC: 9 10*3/uL (ref 4.5–10.5)

## 2012-08-17 ENCOUNTER — Ambulatory Visit (HOSPITAL_COMMUNITY): Payer: Medicare Other | Attending: Cardiology | Admitting: Radiology

## 2012-08-17 DIAGNOSIS — R0989 Other specified symptoms and signs involving the circulatory and respiratory systems: Secondary | ICD-10-CM

## 2012-08-17 DIAGNOSIS — Z87891 Personal history of nicotine dependence: Secondary | ICD-10-CM | POA: Insufficient documentation

## 2012-08-17 DIAGNOSIS — R011 Cardiac murmur, unspecified: Secondary | ICD-10-CM

## 2012-08-17 DIAGNOSIS — R0600 Dyspnea, unspecified: Secondary | ICD-10-CM

## 2012-08-17 DIAGNOSIS — E119 Type 2 diabetes mellitus without complications: Secondary | ICD-10-CM | POA: Insufficient documentation

## 2012-08-17 DIAGNOSIS — E785 Hyperlipidemia, unspecified: Secondary | ICD-10-CM | POA: Insufficient documentation

## 2012-08-17 DIAGNOSIS — R0609 Other forms of dyspnea: Secondary | ICD-10-CM | POA: Insufficient documentation

## 2012-08-17 DIAGNOSIS — E669 Obesity, unspecified: Secondary | ICD-10-CM | POA: Insufficient documentation

## 2012-08-17 NOTE — Progress Notes (Signed)
Echocardiogram performed.  

## 2012-08-19 ENCOUNTER — Ambulatory Visit
Admission: RE | Admit: 2012-08-19 | Discharge: 2012-08-19 | Disposition: A | Discharge status: Home / Self Care | Payer: Medicare Other | Source: Ambulatory Visit | Attending: Internal Medicine | Admitting: Internal Medicine

## 2012-08-19 DIAGNOSIS — M549 Dorsalgia, unspecified: Secondary | ICD-10-CM

## 2012-08-27 ENCOUNTER — Ambulatory Visit
Admission: RE | Admit: 2012-08-27 | Discharge: 2012-08-27 | Disposition: A | Discharge status: Home / Self Care | Payer: Medicare Other | Source: Ambulatory Visit | Attending: Internal Medicine | Admitting: Internal Medicine

## 2012-08-28 ENCOUNTER — Other Ambulatory Visit: Payer: Self-pay | Admitting: Internal Medicine

## 2012-08-28 DIAGNOSIS — M48061 Spinal stenosis, lumbar region without neurogenic claudication: Secondary | ICD-10-CM

## 2012-08-28 DIAGNOSIS — M4804 Spinal stenosis, thoracic region: Secondary | ICD-10-CM

## 2012-08-30 ENCOUNTER — Encounter: Payer: Self-pay | Admitting: Pulmonary Disease

## 2012-08-30 ENCOUNTER — Ambulatory Visit (INDEPENDENT_AMBULATORY_CARE_PROVIDER_SITE_OTHER): Payer: Medicare Other | Admitting: Pulmonary Disease

## 2012-08-30 VITALS — BP 112/62 | HR 77 | Temp 98.5°F | Ht 61.25 in | Wt 187.2 lb

## 2012-08-30 DIAGNOSIS — G4733 Obstructive sleep apnea (adult) (pediatric): Secondary | ICD-10-CM

## 2012-08-30 NOTE — Progress Notes (Signed)
  Subjective:    Patient ID: Katie Booker, female    DOB: [birth year], 67 y.o.   MRN: [id number]  HPI The patient comes in today for followup of her obstructive sleep apnea.  We discussed restarting CPAP at the last visit because of the severity of her sleep apnea, but unfortunately she has not tried the device even once since our visit.  I had reviewed desensitization methods at the last visit, and she has not tried these either.  She does want to continue working on using CPAP.   Review of Systems  Constitutional: Negative for fever and unexpected weight change.  HENT: Negative for ear pain, nosebleeds, congestion, sore throat, rhinorrhea, sneezing, trouble swallowing, dental problem, postnasal drip and sinus pressure.   Eyes: Negative for redness and itching.  Respiratory: Negative for cough, chest tightness, shortness of breath and wheezing.   Cardiovascular: Negative for palpitations and leg swelling.  Gastrointestinal: Negative for nausea and vomiting.  Genitourinary: Negative for dysuria.  Musculoskeletal: Negative for joint swelling.  Skin: Negative for rash.  Neurological: Negative for headaches.  Hematological: Does not bruise/bleed easily.  Psychiatric/Behavioral: Negative for dysphoric mood. The patient is not nervous/anxious.        Objective:   Physical Exam Obese female in no acute distress Nose without purulence or discharge noted Neck without lymphadenopathy or thyromegaly Lower extremities with no significant edema, no cyanosis Alert and oriented, moves all 4 extremities.       Assessment & Plan:

## 2012-08-30 NOTE — Assessment & Plan Note (Signed)
The patient has been totally noncompliant with CPAP since the last visit, and did not work on desensitization methods that I had reviewed.  She does not feel this is a "lost cause", and would like to make more of an attempt at trying CPAP.  I have asked her to start using as we prescribed last visit, and she is to call me in 3 weeks with an update.  We can then work on pressure optimization and followup.  If she continues to have issues with CPAP tolerance, I would refer her for a dental appliance.  Although, she understands this is not optimal therapy for her degree of sleep apnea.

## 2012-08-30 NOTE — Patient Instructions (Addendum)
Make a good effort at wearing cpap during the day a few times to help with densensitization.  Then can start wearing at night during sleep Please call me if specific issues with tolerance that I can help troubleshoot.   Work on weight loss. Please call me in 3 weeks with an update on how things are going, then we can talk about next step and arrange followup

## 2012-09-04 ENCOUNTER — Ambulatory Visit: Payer: Medicare Other | Admitting: Internal Medicine

## 2012-09-11 ENCOUNTER — Encounter: Payer: Self-pay | Admitting: Internal Medicine

## 2012-10-02 ENCOUNTER — Other Ambulatory Visit: Payer: Self-pay | Admitting: *Deleted

## 2012-10-02 MED ORDER — ZOLPIDEM TARTRATE 10 MG PO TABS: 10.0000 mg | ORAL_TABLET | Freq: Every evening | ORAL | Status: DC | PRN

## 2012-10-10 ENCOUNTER — Other Ambulatory Visit: Payer: Self-pay | Admitting: Neurological Surgery

## 2012-10-10 DIAGNOSIS — M4714 Other spondylosis with myelopathy, thoracic region: Secondary | ICD-10-CM

## 2012-10-11 ENCOUNTER — Ambulatory Visit
Admission: RE | Admit: 2012-10-11 | Discharge: 2012-10-11 | Disposition: A | Discharge status: Home / Self Care | Payer: Medicare Other | Source: Ambulatory Visit | Attending: Neurological Surgery | Admitting: Neurological Surgery

## 2012-10-11 DIAGNOSIS — M4714 Other spondylosis with myelopathy, thoracic region: Secondary | ICD-10-CM

## 2012-10-17 ENCOUNTER — Telehealth: Payer: Self-pay | Admitting: Internal Medicine

## 2012-10-17 NOTE — Telephone Encounter (Signed)
Chris from pharmacy is calling and stating that the pt's recent traMADol (ULTRAM) 50 MG tablet RX is interfering with citalopram (CELEXA) 40 MG tablet. "Increase risk of seizures, when taking ultram and citalopram together". Please assist.

## 2012-10-17 NOTE — Telephone Encounter (Signed)
Discontinue tramadol.

## 2012-10-17 NOTE — Telephone Encounter (Signed)
Called pharmacy and spoke to [place] told him to discontinue Tramadol. Thayer Ohm verbalized understanding.  Called pt told her need to discontinue Tramadol due to risk of seizures with Celexa. Pt verbalized understanding but stated I need something to take for pain for my arthritis. Told pt will have to ask Dr. Kirtland Bouchard what she can take and get back to her. Pt verbalized understanding.

## 2012-10-18 MED ORDER — HYDROCODONE-ACETAMINOPHEN 5-325 MG PO TABS: 1.0000 | ORAL_TABLET | Freq: Three times a day (TID) | ORAL | Status: DC | PRN

## 2012-10-18 MED ORDER — ALPRAZOLAM 0.5 MG PO TABS: 0.5000 mg | ORAL_TABLET | Freq: Two times a day (BID) | ORAL | Status: DC | PRN

## 2012-10-18 NOTE — Telephone Encounter (Signed)
Discussed with Dr. Kirtland Bouchard that pt needs something for pain for arthritis since discontinuing Tramadol. Verbal order given for Hydrocodone 5-325 mg.  Called pt told her will try Hydrocodone 5-325 mg one tablet every 8 hours prn for pain. Discussed with pt to watch tylenol consumption due to tylenol in pain medication. Pt verbalized understanding.

## 2012-10-20 ENCOUNTER — Other Ambulatory Visit: Payer: Self-pay | Admitting: Neurological Surgery

## 2012-10-20 DIAGNOSIS — M5000 Cervical disc disorder with myelopathy, unspecified cervical region: Secondary | ICD-10-CM

## 2012-10-26 ENCOUNTER — Ambulatory Visit (INDEPENDENT_AMBULATORY_CARE_PROVIDER_SITE_OTHER): Payer: Medicare Other | Admitting: Internal Medicine

## 2012-10-26 ENCOUNTER — Encounter: Payer: Self-pay | Admitting: Internal Medicine

## 2012-10-26 ENCOUNTER — Telehealth: Payer: Self-pay | Admitting: Internal Medicine

## 2012-10-26 VITALS — BP 124/70 | HR 85 | Temp 98.5°F | Resp 20 | Wt 183.0 lb

## 2012-10-26 DIAGNOSIS — E119 Type 2 diabetes mellitus without complications: Secondary | ICD-10-CM

## 2012-10-26 DIAGNOSIS — M4804 Spinal stenosis, thoracic region: Secondary | ICD-10-CM | POA: Insufficient documentation

## 2012-10-26 DIAGNOSIS — M48062 Spinal stenosis, lumbar region with neurogenic claudication: Secondary | ICD-10-CM

## 2012-10-26 DIAGNOSIS — D649 Anemia, unspecified: Secondary | ICD-10-CM

## 2012-10-26 DIAGNOSIS — R300 Dysuria: Secondary | ICD-10-CM

## 2012-10-26 DIAGNOSIS — I10 Essential (primary) hypertension: Secondary | ICD-10-CM

## 2012-10-26 LAB — POCT URINALYSIS DIPSTICK
Ketones, UA: NEGATIVE
Urobilinogen, UA: 0.2

## 2012-10-26 LAB — CBC WITH DIFFERENTIAL/PLATELET
Basophils Absolute: 0 10*3/uL (ref 0.0–0.1)
Hemoglobin: 12.4 g/dL (ref 12.0–15.0)
Lymphocytes Relative: 33.6 % (ref 12.0–46.0)
Monocytes Relative: 7.7 % (ref 3.0–12.0)
Platelets: 245 10*3/uL (ref 150.0–400.0)
RDW: 19.9 % — ABNORMAL HIGH (ref 11.5–14.6)

## 2012-10-26 LAB — GLUCOSE, POCT (MANUAL RESULT ENTRY): POC Glucose: 149 mg/dl — AB (ref 70–99)

## 2012-10-26 MED ORDER — CIPROFLOXACIN HCL 500 MG PO TABS: 500.0000 mg | ORAL_TABLET | Freq: Two times a day (BID) | ORAL | Status: DC

## 2012-10-26 NOTE — Telephone Encounter (Signed)
Drug interaction:ciprofloxacin (CIPRO) 500 MG tablet and citalopram (CELEXA) 40 MG tablet QT prolongation/ pls advise. Pt is waiting at pharm.  Lupita Leash aware

## 2012-10-26 NOTE — Progress Notes (Signed)
Subjective:    Patient ID: Katie Booker, female    DOB: 10/20/1945, 67 y.o.   MRN: [id number]  HPI  67 year old patient who is seen today for followup. She presents with a one-week history of burning dysuria and frequency. She is also seen for followup of anemia. Anti-inflammatory medications have been discontinued and she has been on iron supplementation. She has treated hypertension which has done well off diuretic therapy. 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 She is scheduled for a cervical MRI preoperatively  Past Medical History  Diagnosis Date  . ANEMIA-NOS 09/18/2009  . DEPRESSION 09/18/2009  . DIABETES MELLITUS, TYPE II 09/18/2009  . GERD 09/18/2009  . HYPERLIPIDEMIA 09/18/2009  . HYPERTENSION 09/18/2009  . HYPOTHYROIDISM 09/18/2009  . OSTEOARTHRITIS 09/18/2009  . Obesity   . OSA (obstructive sleep apnea)   . Cancer   . Hemorrhoids   . Anal fissure     History   Social History  . Marital Status: Divorced    Spouse Name: N/A    Number of Children: N/A  . Years of Education: N/A   Occupational History  . retired    Social History Main Topics  . Smoking status: Former Smoker -- 1.00 packs/day for 20 years    Types: Cigarettes    Quit date: 02/23/2004  . Smokeless tobacco: Not on file     Comment: quit 2007  . Alcohol Use: Yes     Comment: rare  . Drug Use: No  . Sexual Activity: Not on file   Other Topics Concern  . Not on file   Social History Narrative  . No narrative on file    Past Surgical History  Procedure Laterality Date  . Tubal fulgaration  1991  . Colonoscopy  2007  . Esophagogastroduodenoscopy  2007  . Tubal ligation    . Hemorrhoid surgery  03/23/2011    Procedure: HEMORRHOIDECTOMY;  Surgeon: Clovis Pu. Cornett, MD;  Location: [facility];  Service: General;  Laterality: N/A;  lateral  internal sphincterotomy and hemorrhoidectomy    Family History  Problem Relation Age of Onset  . Diabetes Father   . COPD Father   . Cancer Maternal Grandfather     stomach    No Known Allergies  Current Outpatient Prescriptions on File Prior to Visit  Medication Sig Dispense Refill  . ALPRAZolam (XANAX) 0.5 MG tablet Take 1 tablet (0.5 mg total) by mouth 2 (two) times daily as needed.  60 tablet  3  . atenolol (TENORMIN) 25 MG tablet Take 1 tablet (25 mg total) by mouth daily.  90 tablet  3  . atorvastatin (LIPITOR) 40 MG tablet Take 1 tablet (40 mg total) by mouth daily.  90 tablet  3  . Calcium Carbonate-Vit D-Min (GNP CALCIUM 600 PLUS D/MINERAL) 600-400 MG-UNIT TABS Take 1 tablet by mouth 2 (two) times daily.        . citalopram (CELEXA) 40 MG tablet Take 1 tablet (40 mg total) by mouth daily.  90 tablet  6  . Ferrous Sulfate Dried (PX IRON) 200 (65 FE) MG TABS Take 1 tablet by mouth 2 (two) times daily.      Marland Kitchen gemfibrozil (LOPID) 600 MG tablet Take 1 tablet (600 mg total) by mouth 2 (two) times daily before a meal.  180 tablet  6  . glimepiride (AMARYL) 2 MG tablet Take 0.5  tablets (1 mg total) by mouth daily before breakfast.  30 tablet  3  . glucose blood (ONE TOUCH ULTRA TEST) test strip 1 each by Other route daily. Use as instructed  100 each  6  . HYDROcodone-acetaminophen (NORCO/VICODIN) 5-325 MG per tablet Take 1 tablet by mouth every 8 (eight) hours as needed for pain.  60 tablet  1  . Lancets (ONETOUCH ULTRASOFT) lancets Use daily  100 each  6  . levothyroxine (SYNTHROID, LEVOTHROID) 150 MCG tablet Take 1 tablet (150 mcg total) by mouth daily.  90 tablet  6  . lisinopril (PRINIVIL,ZESTRIL) 10 MG tablet Take 1 tablet (10 mg total) by mouth daily.  90 tablet  1  . metFORMIN (GLUCOPHAGE) 1000 MG tablet Take 1 tablet (1,000 mg total) by mouth 2 (two) times daily with a meal.  180 tablet  6  . Multiple Vitamin (MULTIVITAMIN) tablet Take 1 tablet by mouth daily.        .  Multiple Vitamins-Minerals (EYE VITAMINS) TABS Take by mouth daily.        . Omega-3 Fatty Acids (FISH OIL) 1200 MG CAPS Take 2 capsules by mouth 2 (two) times daily.      Marland Kitchen omeprazole (PRILOSEC) 20 MG capsule Take 1 capsule (20 mg total) by mouth 2 (two) times daily.  180 capsule  4  . pregabalin (LYRICA) 75 MG capsule Take 1 capsule (75 mg total) by mouth daily.  90 capsule  3  . traZODone (DESYREL) 100 MG tablet Take 1 tablet (100 mg total) by mouth at bedtime.  90 tablet  6  . zolpidem (AMBIEN) 10 MG tablet Take 1 tablet (10 mg total) by mouth at bedtime as needed.  30 tablet  3  . Alcaftadine (LASTACAFT) 0.25 % SOLN Apply 1 drop to eye daily.      . Loratadine (CLARITIN PO) Take 1 tablet by mouth as needed.       No current facility-administered medications on file prior to visit.    BP 124/70  Pulse 85  Temp(Src) 98.5 F (36.9 C) (Oral)  Resp 20  Wt 183 lb (83.008 kg)  BMI 34.28 kg/m2  SpO2 94%       Review of Systems  HENT: Negative for hearing loss, congestion, sore throat, rhinorrhea, dental problem, sinus pressure and tinnitus.   Eyes: Negative for pain, discharge and visual disturbance.  Respiratory: Negative for cough and shortness of breath.   Cardiovascular: Negative for chest pain, palpitations and leg swelling.  Gastrointestinal: Negative for nausea, vomiting, abdominal pain, diarrhea, constipation, blood in stool and abdominal distention.  Genitourinary: Positive for dysuria and frequency. Negative for urgency, hematuria, flank pain, vaginal bleeding, vaginal discharge, difficulty urinating, vaginal pain and pelvic pain.  Musculoskeletal: Positive for gait problem. Negative for joint swelling and arthralgias.  Skin: Negative for rash.  Neurological: Positive for weakness. Negative for dizziness, syncope, speech difficulty, numbness and headaches.  Hematological: Negative for adenopathy.  Psychiatric/Behavioral: Negative for behavioral problems, dysphoric mood and  agitation. The patient is not nervous/anxious.        Objective:   Physical Exam  Constitutional: She is oriented to person, place, and time. She appears well-developed and well-nourished.  HENT:  Head: Normocephalic.  Right Ear: External ear normal.  Left Ear: External ear normal.  Mouth/Throat: Oropharynx is clear and moist.  Eyes: Conjunctivae and EOM are normal. Pupils are equal, round, and reactive to light.  Neck: Normal range of motion. Neck supple. No thyromegaly present.  Cardiovascular: Normal rate,  regular rhythm, normal heart sounds and intact distal pulses.   Pulmonary/Chest: Effort normal and breath sounds normal.  Abdominal: Soft. Bowel sounds are normal. She exhibits no mass. There is no tenderness.  Musculoskeletal: Normal range of motion.  Lymphadenopathy:    She has no cervical adenopathy.  Neurological: She is alert and oriented to person, place, and time.  Skin: Skin is warm and dry. No rash noted.  Psychiatric: She has a normal mood and affect. Her behavior is normal.          Assessment & Plan:   UTI. Will treat with Cipro for 3 days Hypertension well controlled Spinal stenosis. Followup neurosurgery Diabetes mellitus Anemia. We'll continue iron supplementation and avoid anti-inflammatory medications. Check a followup CBC

## 2012-10-26 NOTE — Patient Instructions (Signed)
Take your antibiotic as prescribed until ALL of it is gone, but stop if you develop a rash, swelling, or any side effects of the medication.  Contact our office as soon as possible if  there are side effects of the medication.   Please check your hemoglobin A1c every 3 months   

## 2012-10-26 NOTE — Telephone Encounter (Signed)
Spoke to pharmacist told him Dr. Kirtland Bouchard said okay to give Cipro.

## 2012-10-30 ENCOUNTER — Ambulatory Visit
Admission: RE | Admit: 2012-10-30 | Discharge: 2012-10-30 | Disposition: A | Discharge status: Home / Self Care | Payer: Medicare Other | Source: Ambulatory Visit | Attending: Neurological Surgery | Admitting: Neurological Surgery

## 2012-10-30 DIAGNOSIS — M5000 Cervical disc disorder with myelopathy, unspecified cervical region: Secondary | ICD-10-CM

## 2012-11-02 ENCOUNTER — Other Ambulatory Visit: Payer: Self-pay | Admitting: Neurological Surgery

## 2012-11-13 ENCOUNTER — Other Ambulatory Visit: Payer: Self-pay | Admitting: Internal Medicine

## 2012-11-21 ENCOUNTER — Telehealth: Payer: Self-pay | Admitting: Internal Medicine

## 2012-11-21 MED ORDER — HYDROCODONE-ACETAMINOPHEN 5-325 MG PO TABS: 1.0000 | ORAL_TABLET | Freq: Four times a day (QID) | ORAL | Status: DC | PRN

## 2012-11-21 NOTE — Telephone Encounter (Signed)
Spoke to pt told her I can call in refill for Hydrocodone today, but next month will have to pick one up. Pt verbalized understanding and stated she is having to take 3 a day due to pain and she is not making it 8 hours is only lasting 6-7 hours and then take the Tylenol in between. Told pt will chang directions to every 6 hours so she does not have to take extra Tylenol and will give her 90 tablets for the month. Pt verbalized understanding. Rx called into pharmacy.

## 2012-11-21 NOTE — Telephone Encounter (Signed)
Pt is calling to request a written order for her HYDROcodone-acetaminophen (NORCO/VICODIN) 5-325 MG per tablet. She states that she received a letter from her insurance stating that they will need to receive a written RX. Please assist.

## 2012-11-27 ENCOUNTER — Other Ambulatory Visit: Payer: Self-pay | Admitting: Internal Medicine

## 2012-11-30 ENCOUNTER — Encounter (HOSPITAL_COMMUNITY): Payer: Self-pay | Admitting: Pharmacy Technician

## 2012-12-01 ENCOUNTER — Encounter (HOSPITAL_COMMUNITY)
Admission: RE | Admit: 2012-12-01 | Discharge: 2012-12-01 | Disposition: A | Discharge status: Home / Self Care | Payer: Medicare Other | Source: Ambulatory Visit | Attending: Neurological Surgery | Admitting: Neurological Surgery

## 2012-12-01 ENCOUNTER — Ambulatory Visit (HOSPITAL_COMMUNITY)
Admission: RE | Admit: 2012-12-01 | Discharge: 2012-12-01 | Disposition: A | Discharge status: Home / Self Care | Payer: Medicare Other | Source: Ambulatory Visit | Attending: Anesthesiology | Admitting: Anesthesiology

## 2012-12-01 ENCOUNTER — Encounter (HOSPITAL_COMMUNITY): Payer: Self-pay

## 2012-12-01 DIAGNOSIS — Z01818 Encounter for other preprocedural examination: Secondary | ICD-10-CM | POA: Insufficient documentation

## 2012-12-01 DIAGNOSIS — IMO0002 Reserved for concepts with insufficient information to code with codable children: Secondary | ICD-10-CM | POA: Insufficient documentation

## 2012-12-01 DIAGNOSIS — Z01812 Encounter for preprocedural laboratory examination: Secondary | ICD-10-CM | POA: Insufficient documentation

## 2012-12-01 DIAGNOSIS — Z01810 Encounter for preprocedural cardiovascular examination: Secondary | ICD-10-CM | POA: Insufficient documentation

## 2012-12-01 DIAGNOSIS — R911 Solitary pulmonary nodule: Secondary | ICD-10-CM | POA: Insufficient documentation

## 2012-12-01 HISTORY — DX: Insomnia, unspecified: G47.00

## 2012-12-01 HISTORY — DX: Personal history of colon polyps, unspecified: Z86.0100

## 2012-12-01 HISTORY — DX: Anxiety disorder, unspecified: F41.9

## 2012-12-01 HISTORY — DX: Frequency of micturition: R35.0

## 2012-12-01 HISTORY — DX: Umbilical hernia without obstruction or gangrene: K42.9

## 2012-12-01 HISTORY — DX: Polyneuropathy, unspecified: G62.9

## 2012-12-01 HISTORY — DX: Urgency of urination: R39.15

## 2012-12-01 HISTORY — DX: Personal history of colonic polyps: Z86.010

## 2012-12-01 LAB — BASIC METABOLIC PANEL
BUN: 15 mg/dL (ref 6–23)
CO2: 25 mEq/L (ref 19–32)
Calcium: 9.8 mg/dL (ref 8.4–10.5)
Chloride: 97 mEq/L (ref 96–112)
Creatinine, Ser: 0.68 mg/dL (ref 0.50–1.10)
GFR calc Af Amer: >90 mL/min (ref >90)
GFR calc non Af Amer: 89 mL/min — ABNORMAL LOW (ref >90)
Glucose, Bld: 116 mg/dL — ABNORMAL HIGH (ref 70–99)
Potassium: 4.2 mEq/L (ref 3.5–5.1)
Sodium: 136 mEq/L (ref 135–145)

## 2012-12-01 LAB — SURGICAL PCR SCREEN: Staphylococcus aureus: NEGATIVE

## 2012-12-01 LAB — CBC
HCT: 38.7 % (ref 36.0–46.0)
MCH: 25.8 pg — ABNORMAL LOW (ref 26.0–34.0)
MCHC: 33.1 g/dL (ref 30.0–36.0)
MCV: 78 fL (ref 78.0–100.0)
RDW: 14.8 % (ref 11.5–15.5)
WBC: 8.5 10*3/uL (ref 4.0–10.5)

## 2012-12-01 NOTE — Progress Notes (Addendum)
Saw Dr.Varanasi the 1st time and then Hendy Brindle the next time  Echo report in epic from 08-15-12  Denies ever having a heart cath  Sleep study report in epic from 2009  Dr.Peter Amador Cunas      Stress test done in 2009/2010 with [facility]

## 2012-12-01 NOTE — Pre-Procedure Instructions (Signed)
Katie Booker  12/01/2012   Your procedure is scheduled on:  Fri, Oct 24 @ 7:30 AM  Report to [facility] at 5:30AM.  Call this number if you have problems the morning of surgery: [phone]   Remember:   Do not eat food or drink liquids after midnight.   Take these medicines the morning of surgery with A SIP OF WATER: Xanax(Alprazolam),Atenolol(Tenormin),Celexa(Citalopram),Pain Pill(if needed),Synthroid(Levothyroxine),Claritin(Loratadine),Lyrica(Pregabalin),and Omeprazole(Prilosec)               Stop taking your Fish Oil 7 days prior to surgery.No Goody's,BC's,Aleve,Ibuprofen,or any Herbal Medications   Do not wear jewelry, make-up or nail polish.  Do not wear lotions, powders, or perfumes. You may wear deodorant.  Do not shave 48 hours prior to surgery.   Do not bring valuables to the hospital.  [facility] is not responsible                  for any belongings or valuables.               Contacts, dentures or bridgework may not be worn into surgery.  Leave suitcase in the car. After surgery it may be brought to your room.  For patients admitted to the hospital, discharge time is determined by your                treatment team.                 Special Instructions: Shower using CHG 2 nights before surgery and the night before surgery.  If you shower the day of surgery use CHG.  Use special wash - you have one bottle of CHG for all showers.  You should use approximately 1/3 of the bottle for each shower.   Please read over the following fact sheets that you were given: Pain Booklet, Coughing and Deep Breathing, Blood Transfusion Information, MRSA Information and Surgical Site Infection Prevention

## 2012-12-02 LAB — TYPE AND SCREEN: Antibody Screen: NEGATIVE

## 2012-12-04 NOTE — Progress Notes (Signed)
Anesthesia Chart Review:  Patient is a 67 year old female scheduled for L2-3, L3-4, L4-5 PLIF with T10-11 laminectomy on 12/15/12 by Dr. Danielle Dess.  History includes obesity, former smoker, HLD, hypothyroidism, osteoarthritis, anxiety, depression, HTN, OSA, DM2 with peripheral neuropathy, umbilical hernia, anemia, headaches, cancer (not specified).  PCP is Dr. Eleonore Chiquito.  OSA is followed by Dr. Marcelyn Bruins.  She had a low risk stress test in 2009 at [facility] (Dr. Eldridge Dace), but is not currently followed by a cardiologist.  EKG on 12/01/12 showed NSR.  Echo on 08/17/12 showed: - Left ventricle: The cavity size was normal. Wall thickness was normal. Systolic function was normal. The estimated ejection fraction was in the range of 55% to 60%. Wall motion was normal; there were no regional wall motion abnormalities. Doppler parameters are consistent with abnormal left ventricular relaxation (grade 1 diastolic dysfunction). - Mitral valve: Calcified annulus. - Left atrium: The atrium was mildly dilated.  Nuclear stress test on 01/30/08 showed perfusion defect in teh anterior myocardial region consistent with breast attenuation.  There is minimal reversibility.  The remaining myocardium demonstrated normal myocardial perfusion with no evidence of ischemia or infarct.  Post-stress EF 78%.  Global LV systolic function is normal.  Low risk scan.  Anterior defect had improved since her prior scan in 2008.  CXR on 12/01/12 showed: 1. Mild bronchitic change without acute cardiopulmonary disease.  2. Dominant right upper lobe pulmonary nodule appears grossly unchanged while the additional known nodule within right middle lobe is not well demonstrated on the present examination, though note stability and/or resolution should not be assumed on the basis of this examination. Further evaluation with chest CT may be performed to ensure continued stability/resolution as indicated. Shanda Bumps at Dr. Verlee Rossetti  office was already notified of CXR results by patient's PAT RN on 12/01/12.)    Preoperative labs noted. Her last A1C on 08/04/12 was 6.6.  If she is not having any acute respiratory symptoms, then I do not think that her CXR findings with interfere with plans for surgery.  I will defer timing of further evaluation of abnormal CXR to Dr. Danielle Dess.   Velna Ochs [facility] Stay [facility]/Anesthesiology Phone [phone] 12/04/2012 4:17 PM

## 2012-12-06 ENCOUNTER — Other Ambulatory Visit (HOSPITAL_COMMUNITY): Payer: Self-pay | Admitting: Neurological Surgery

## 2012-12-06 DIAGNOSIS — R911 Solitary pulmonary nodule: Secondary | ICD-10-CM

## 2012-12-08 ENCOUNTER — Telehealth: Payer: Self-pay | Admitting: Internal Medicine

## 2012-12-08 ENCOUNTER — Other Ambulatory Visit: Payer: Self-pay | Admitting: Internal Medicine

## 2012-12-08 ENCOUNTER — Ambulatory Visit (HOSPITAL_COMMUNITY)
Admission: RE | Admit: 2012-12-08 | Discharge: 2012-12-08 | Disposition: A | Discharge status: Home / Self Care | Payer: Medicare Other | Source: Ambulatory Visit | Attending: Neurological Surgery | Admitting: Neurological Surgery

## 2012-12-08 ENCOUNTER — Encounter (HOSPITAL_COMMUNITY): Payer: Self-pay

## 2012-12-08 DIAGNOSIS — I2510 Atherosclerotic heart disease of native coronary artery without angina pectoris: Secondary | ICD-10-CM | POA: Insufficient documentation

## 2012-12-08 DIAGNOSIS — K7689 Other specified diseases of liver: Secondary | ICD-10-CM | POA: Insufficient documentation

## 2012-12-08 DIAGNOSIS — R918 Other nonspecific abnormal finding of lung field: Secondary | ICD-10-CM

## 2012-12-08 DIAGNOSIS — M47814 Spondylosis without myelopathy or radiculopathy, thoracic region: Secondary | ICD-10-CM | POA: Insufficient documentation

## 2012-12-08 MED ORDER — IOHEXOL 300 MG/ML  SOLN
100.0000 mL | Freq: Once | INTRAMUSCULAR | Status: AC | PRN
  Administered 2012-12-08: 80 mL via INTRAVENOUS

## 2012-12-08 NOTE — Telephone Encounter (Signed)
Spoke to pt told her Rx for Hydrocodone was just filled on 9/30 can not give another Rx yet. Asked pt how many times a day is she taking it? Pt stated every 6 hours and sometimes during the night when her pain wakes her up. Asked pt if taking anything else in between to help with pain? Pt stated she takes Tylenol arthritis 2 times a day. Told pt I will need to discuss with Dr.K and get back to her. Pt verbalized understanding.

## 2012-12-08 NOTE — Telephone Encounter (Signed)
Discussed with Dr. Kirtland Bouchard and he said okay to refill pain medicine.   Called pt back, she asked if I got her message? Told her yes and meanwhile Dr. Kirtland Bouchard said okay to refill pain medication. Told pt she can pickup Rx on Monday that way she will have enough. Pt verbalized understanding.

## 2012-12-08 NOTE — Telephone Encounter (Signed)
Pt states she has 21 tabs left. Pt will call later to get a rx.  Pt goes in 10/24 for back surgery. Hopefully that will last until her surgery. She will call back when she needs.

## 2012-12-08 NOTE — Telephone Encounter (Signed)
Pt needs new rx hydrocodone °

## 2012-12-10 ENCOUNTER — Other Ambulatory Visit: Payer: Self-pay | Admitting: Internal Medicine

## 2012-12-11 ENCOUNTER — Telehealth: Payer: Self-pay | Admitting: Internal Medicine

## 2012-12-11 MED ORDER — HYDROCODONE-ACETAMINOPHEN 5-325 MG PO TABS: 1.0000 | ORAL_TABLET | Freq: Four times a day (QID) | ORAL | Status: DC | PRN

## 2012-12-11 NOTE — Telephone Encounter (Signed)
Discussed with Dr. Amador Cunas he said okay for pt to resume Metformin.  Called pt told her okay to resume taking Metformin per Dr. Amador Cunas. Pt verbalized understanding.

## 2012-12-11 NOTE — Telephone Encounter (Signed)
Caller: Katie Booker/Patient; Phone: [phone] Ext:[phone]; Reason for Call: Metformin stopped on 10-17 for CT w/ Dye.  Pt would like to know when she should start Metformin again.  Pt is urinating and drinking normally.  Pt is asymptomatic at call.  [facility] said to call MD in 48 hrs.  Please ask Dr Amador Cunas and f/u w/ Pt, per Pt may leave info on vmail.

## 2012-12-11 NOTE — Telephone Encounter (Signed)
Rx printed and signed and put at front desk for pickup. 

## 2012-12-12 ENCOUNTER — Other Ambulatory Visit: Payer: Self-pay | Admitting: *Deleted

## 2012-12-12 MED ORDER — PREGABALIN 75 MG PO CAPS: ORAL_CAPSULE | ORAL | Status: DC

## 2012-12-14 MED ORDER — CEFAZOLIN SODIUM-DEXTROSE 2-3 GM-% IV SOLR
2.0000 g | INTRAVENOUS | Status: AC
  Administered 2012-12-15 (×2): 2 g via INTRAVENOUS
  Filled 2012-12-14: qty 50

## 2012-12-14 NOTE — H&P (Signed)
Katie Booker is an 67 y.o. female.   Chief Complaint: Back pain with dysesthesias in lower extremities, neurogenic claudication, lumbar radiculopathy HPI: Patient is a 67 year old individual is had significant problems with back pain and numbness and dysesthesias in lower extremities. She has evidence of severe spondylitic stenosis at T10-T11 and the thoracic spine secondary to thoracic myelopathy she also has marked degenerative changes at L2-3 3445 with a severe central stenosis and degenerative spondylolisthesis and nerve root compromise is been treated conservatively for over a years period time and has been failing the stenosis signs of increasing myelopathy and lower extremities advised regarding the need for surgical decompression at the thoracic spine at T10-T11 and then the lumbar spine from L2-L5. She is now admitted for these procedures.  Past Medical History  Diagnosis Date  . HYPERLIPIDEMIA 09/18/2009    takes Lipitor daily  . HYPOTHYROIDISM 09/18/2009  . OSTEOARTHRITIS 09/18/2009  . Obesity   . Hemorrhoids   . Anal fissure   . Anxiety     takes Xanax prn sleep and anxiety  . DEPRESSION 09/18/2009    takes Celexa daily  . HYPERTENSION 09/18/2009    takes Atenolol daily and Lisinopril  . Histoplasmosis     hx of  . Shortness of breath     with exertion  . OSA (obstructive sleep apnea)     has a CPAP and sleep study report in epic from 2009  . History of bronchitis     last time around 1990  . Headache(784.0)     sinus HA  . Peripheral neuropathy     takes Lyrica daily  . Joint pain   . Joint swelling   . Chronic back pain     scoliosis/stenosis/spondylosis  . Skin spots, red     right above both elbows;pt states always there  . GERD 09/18/2009    takes Omeprazole daily  . Umbilical hernia   . ANEMIA-NOS 09/18/2009    takes Ferrous Sulfate daily  . History of colon polyps   . Urinary frequency   . Urinary urgency   . Insomnia     takes trazodone and ambien nightly   . Cancer     basal cell left cheek  . DIABETES MELLITUS, TYPE II 09/18/2009    takes Amaryl daily and Metformin    Past Surgical History  Procedure Laterality Date  . Tubal fulgaration  1991  . Colonoscopy  2007  . Esophagogastroduodenoscopy  2007  . Hemorrhoid surgery  03/23/2011    Procedure: HEMORRHOIDECTOMY;  Surgeon: Clovis Pu. Cornett, MD;  Location: [facility];  Service: General;  Laterality: N/A;  lateral internal sphincterotomy and hemorrhoidectomy  . Sphincterotomy      Family History  Problem Relation Age of Onset  . Diabetes Father   . COPD Father   . Cancer Maternal Grandfather     stomach   Social History:  reports that she quit smoking about 8 years ago. Her smoking use included Cigarettes. She has a 20 pack-year smoking history. She does not have any smokeless tobacco history on file. She reports that she drinks alcohol. She reports that she does not use illicit drugs.  Allergies: No Known Allergies  No prescriptions prior to admission    No results found for this or any previous visit (from the past 48 hour(s)). No results found.  Review of Systems  Eyes: Negative.   Respiratory: Negative.   Cardiovascular: Negative.   Gastrointestinal: Negative.   Genitourinary: Negative.  Musculoskeletal: Positive for back pain.  Neurological: Positive for tingling, sensory change, focal weakness and weakness.  Endo/Heme/Allergies: Negative.   Psychiatric/Behavioral: Negative.     There were no vitals taken for this visit. Physical Exam  Constitutional: She is oriented to person, place, and time. She appears well-nourished.  HENT:  Head: Normocephalic and atraumatic.  Eyes: Conjunctivae and EOM are normal. Pupils are equal, round, and reactive to light.  Neck: Normal range of motion. Neck supple.  Cardiovascular: Normal rate and regular rhythm.   Respiratory: Effort normal and breath sounds normal.  GI: Soft. Bowel sounds are normal.   Musculoskeletal:  Back is tender to palpation and percussion in the mid lumbar spine and in the mid and lower thoracic spine. Thoracolumbar junction also reveals some tenderness to palpation and percussion.  Neurological: She is alert and oriented to person, place, and time.  And bicep and tricep reflexes. 2+ patellar reflexes trace Achilles reflexes Babinski's are equivocal area cranial nerve examination is within the limits of normal.  Skin: Skin is dry.  Psychiatric: She has a normal mood and affect. Her behavior is normal. Judgment and thought content normal.     Assessment/Plan Thoracic stenosis with myelopathy and cord compression T10-T11, spondylosis and stenosis with spondylolisthesis L2-3 L3-4 L4-5.  Plan decompression via laminectomy at T10-T11 and decompression from L2-L5 with posterior interbody arthrodesis peek spacers local autograft and allograft.  Aariv Medlock J 12/14/2012, 9:52 PM

## 2012-12-15 ENCOUNTER — Inpatient Hospital Stay (HOSPITAL_COMMUNITY): Payer: Medicare Other | Admitting: Anesthesiology

## 2012-12-15 ENCOUNTER — Encounter (HOSPITAL_COMMUNITY): Payer: Medicare Other | Admitting: Vascular Surgery

## 2012-12-15 ENCOUNTER — Inpatient Hospital Stay (HOSPITAL_COMMUNITY)
Admission: RE | Admit: 2012-12-15 | Discharge: 2012-12-20 | DRG: 460 | Disposition: A | Discharge status: Skilled Nursing Facility | Payer: Medicare Other | Source: Ambulatory Visit | Attending: Neurological Surgery | Admitting: Neurological Surgery

## 2012-12-15 ENCOUNTER — Inpatient Hospital Stay (HOSPITAL_COMMUNITY): Payer: Medicare Other

## 2012-12-15 ENCOUNTER — Encounter (HOSPITAL_COMMUNITY): Payer: Self-pay | Admitting: *Deleted

## 2012-12-15 ENCOUNTER — Encounter (HOSPITAL_COMMUNITY)
Admission: RE | Disposition: A | Discharge status: Skilled Nursing Facility | Payer: Medicare Other | Source: Ambulatory Visit | Attending: Neurological Surgery

## 2012-12-15 DIAGNOSIS — K219 Gastro-esophageal reflux disease without esophagitis: Secondary | ICD-10-CM | POA: Diagnosis present

## 2012-12-15 DIAGNOSIS — E785 Hyperlipidemia, unspecified: Secondary | ICD-10-CM | POA: Diagnosis present

## 2012-12-15 DIAGNOSIS — G4733 Obstructive sleep apnea (adult) (pediatric): Secondary | ICD-10-CM | POA: Diagnosis present

## 2012-12-15 DIAGNOSIS — E669 Obesity, unspecified: Secondary | ICD-10-CM | POA: Diagnosis present

## 2012-12-15 DIAGNOSIS — M1990 Unspecified osteoarthritis, unspecified site: Secondary | ICD-10-CM | POA: Diagnosis present

## 2012-12-15 DIAGNOSIS — F411 Generalized anxiety disorder: Secondary | ICD-10-CM | POA: Diagnosis present

## 2012-12-15 DIAGNOSIS — E119 Type 2 diabetes mellitus without complications: Secondary | ICD-10-CM | POA: Diagnosis present

## 2012-12-15 DIAGNOSIS — E039 Hypothyroidism, unspecified: Secondary | ICD-10-CM | POA: Diagnosis present

## 2012-12-15 DIAGNOSIS — F3289 Other specified depressive episodes: Secondary | ICD-10-CM | POA: Diagnosis present

## 2012-12-15 DIAGNOSIS — M47817 Spondylosis without myelopathy or radiculopathy, lumbosacral region: Secondary | ICD-10-CM | POA: Diagnosis present

## 2012-12-15 DIAGNOSIS — G4700 Insomnia, unspecified: Secondary | ICD-10-CM | POA: Diagnosis present

## 2012-12-15 DIAGNOSIS — D649 Anemia, unspecified: Secondary | ICD-10-CM | POA: Diagnosis present

## 2012-12-15 DIAGNOSIS — M4714 Other spondylosis with myelopathy, thoracic region: Principal | ICD-10-CM | POA: Diagnosis present

## 2012-12-15 DIAGNOSIS — J9819 Other pulmonary collapse: Secondary | ICD-10-CM | POA: Diagnosis not present

## 2012-12-15 DIAGNOSIS — I10 Essential (primary) hypertension: Secondary | ICD-10-CM | POA: Diagnosis present

## 2012-12-15 DIAGNOSIS — Z79899 Other long term (current) drug therapy: Secondary | ICD-10-CM

## 2012-12-15 DIAGNOSIS — G609 Hereditary and idiopathic neuropathy, unspecified: Secondary | ICD-10-CM | POA: Diagnosis present

## 2012-12-15 DIAGNOSIS — Z87891 Personal history of nicotine dependence: Secondary | ICD-10-CM

## 2012-12-15 DIAGNOSIS — Z23 Encounter for immunization: Secondary | ICD-10-CM

## 2012-12-15 DIAGNOSIS — F329 Major depressive disorder, single episode, unspecified: Secondary | ICD-10-CM | POA: Diagnosis present

## 2012-12-15 DIAGNOSIS — M4310 Spondylolisthesis, site unspecified: Secondary | ICD-10-CM | POA: Diagnosis present

## 2012-12-15 DIAGNOSIS — Z85828 Personal history of other malignant neoplasm of skin: Secondary | ICD-10-CM

## 2012-12-15 HISTORY — PX: THORACIC DISCECTOMY: SHX6113

## 2012-12-15 LAB — GLUCOSE, CAPILLARY: Glucose-Capillary: 131 mg/dL — ABNORMAL HIGH (ref 70–99)

## 2012-12-15 SURGERY — THORACIC DISCECTOMY
Anesthesia: General | Site: Back | Wound class: Clean

## 2012-12-15 MED ORDER — PHENYLEPHRINE HCL 10 MG/ML IJ SOLN
10.0000 mg | INTRAVENOUS | Status: DC | PRN
  Administered 2012-12-15: 20 ug/min via INTRAVENOUS

## 2012-12-15 MED ORDER — LIDOCAINE HCL (CARDIAC) 20 MG/ML IV SOLN
INTRAVENOUS | Status: DC | PRN
  Administered 2012-12-15: 90 mg via INTRAVENOUS

## 2012-12-15 MED ORDER — PROMETHAZINE HCL 25 MG/ML IJ SOLN
6.2500 mg | INTRAMUSCULAR | Status: DC | PRN
  Administered 2012-12-15: 6.25 mg via INTRAVENOUS

## 2012-12-15 MED ORDER — HYDROMORPHONE HCL PF 1 MG/ML IJ SOLN
INTRAMUSCULAR | Status: AC
  Administered 2012-12-15: 0.5 mg via INTRAVENOUS
  Filled 2012-12-15: qty 1

## 2012-12-15 MED ORDER — LIDOCAINE-EPINEPHRINE 1 %-1:100000 IJ SOLN
INTRAMUSCULAR | Status: DC | PRN
  Administered 2012-12-15: 9.5 mL

## 2012-12-15 MED ORDER — ROCURONIUM BROMIDE 100 MG/10ML IV SOLN
INTRAVENOUS | Status: DC | PRN
  Administered 2012-12-15: 10 mg via INTRAVENOUS
  Administered 2012-12-15: 20 mg via INTRAVENOUS
  Administered 2012-12-15 (×3): 10 mg via INTRAVENOUS
  Administered 2012-12-15: 50 mg via INTRAVENOUS
  Administered 2012-12-15: 10 mg via INTRAVENOUS
  Administered 2012-12-15: 20 mg via INTRAVENOUS
  Administered 2012-12-15: 10 mg via INTRAVENOUS

## 2012-12-15 MED ORDER — LEVOTHYROXINE SODIUM 150 MCG PO TABS
150.0000 ug | ORAL_TABLET | Freq: Every day | ORAL | Status: DC
  Administered 2012-12-16 – 2012-12-20 (×5): 150 ug via ORAL
  Filled 2012-12-15 (×6): qty 1

## 2012-12-15 MED ORDER — GLYCOPYRROLATE 0.2 MG/ML IJ SOLN
INTRAMUSCULAR | Status: DC | PRN
  Administered 2012-12-15: .7 mg via INTRAVENOUS

## 2012-12-15 MED ORDER — INFLUENZA VAC SPLIT QUAD 0.5 ML IM SUSP
0.5000 mL | INTRAMUSCULAR | Status: AC
  Administered 2012-12-18: 0.5 mL via INTRAMUSCULAR
  Filled 2012-12-15: qty 0.5

## 2012-12-15 MED ORDER — PROPOFOL 10 MG/ML IV BOLUS
INTRAVENOUS | Status: DC | PRN
  Administered 2012-12-15: 200 mg via INTRAVENOUS

## 2012-12-15 MED ORDER — SODIUM CHLORIDE 0.9 % IV SOLN
INTRAVENOUS | Status: DC
  Administered 2012-12-15 – 2012-12-16 (×2): via INTRAVENOUS
  Administered 2012-12-17: 1000 mL via INTRAVENOUS

## 2012-12-15 MED ORDER — SODIUM CHLORIDE 0.9 % IJ SOLN
3.0000 mL | Freq: Two times a day (BID) | INTRAMUSCULAR | Status: DC
  Administered 2012-12-16: 3 mL via INTRAVENOUS
  Administered 2012-12-17: 20:00:00 via INTRAVENOUS
  Administered 2012-12-18 – 2012-12-19 (×3): 3 mL via INTRAVENOUS

## 2012-12-15 MED ORDER — PREGABALIN 75 MG PO CAPS
75.0000 mg | ORAL_CAPSULE | Freq: Two times a day (BID) | ORAL | Status: DC
  Administered 2012-12-15 – 2012-12-20 (×10): 75 mg via ORAL
  Filled 2012-12-15 (×10): qty 1

## 2012-12-15 MED ORDER — FENTANYL CITRATE 0.05 MG/ML IJ SOLN
INTRAMUSCULAR | Status: AC
  Administered 2012-12-15: 50 ug via INTRAVENOUS
  Filled 2012-12-15: qty 2

## 2012-12-15 MED ORDER — PROMETHAZINE HCL 25 MG/ML IJ SOLN
INTRAMUSCULAR | Status: AC
  Administered 2012-12-15: 6.25 mg via INTRAVENOUS
  Filled 2012-12-15: qty 1

## 2012-12-15 MED ORDER — MENTHOL 3 MG MT LOZG: 1.0000 | LOZENGE | OROMUCOSAL | Status: DC | PRN

## 2012-12-15 MED ORDER — POLYETHYLENE GLYCOL 3350 17 G PO PACK
17.0000 g | PACK | Freq: Every day | ORAL | Status: DC | PRN
  Filled 2012-12-15: qty 1

## 2012-12-15 MED ORDER — GLIMEPIRIDE 1 MG PO TABS
1.0000 mg | ORAL_TABLET | Freq: Every day | ORAL | Status: DC
  Administered 2012-12-16 – 2012-12-20 (×4): 1 mg via ORAL
  Filled 2012-12-15 (×6): qty 1

## 2012-12-15 MED ORDER — HYDROMORPHONE HCL PF 1 MG/ML IJ SOLN
0.5000 mg | INTRAMUSCULAR | Status: DC | PRN
  Administered 2012-12-15: 0.5 mg via INTRAVENOUS

## 2012-12-15 MED ORDER — 0.9 % SODIUM CHLORIDE (POUR BTL) OPTIME
TOPICAL | Status: DC | PRN
  Administered 2012-12-15: 1000 mL

## 2012-12-15 MED ORDER — METFORMIN HCL 500 MG PO TABS
1000.0000 mg | ORAL_TABLET | Freq: Every day | ORAL | Status: DC
  Administered 2012-12-16 – 2012-12-20 (×5): 1000 mg via ORAL
  Filled 2012-12-15 (×6): qty 2

## 2012-12-15 MED ORDER — CITALOPRAM HYDROBROMIDE 40 MG PO TABS
40.0000 mg | ORAL_TABLET | Freq: Every day | ORAL | Status: DC
  Administered 2012-12-16 – 2012-12-20 (×5): 40 mg via ORAL
  Filled 2012-12-15 (×5): qty 1

## 2012-12-15 MED ORDER — SODIUM CHLORIDE 0.9 % IV SOLN: 250.0000 mL | INTRAVENOUS | Status: DC

## 2012-12-15 MED ORDER — MORPHINE SULFATE 2 MG/ML IJ SOLN
2.0000 mg | INTRAMUSCULAR | Status: DC | PRN
  Administered 2012-12-15 – 2012-12-18 (×3): 2 mg via INTRAVENOUS
  Filled 2012-12-15 (×3): qty 1

## 2012-12-15 MED ORDER — TRAZODONE HCL 100 MG PO TABS
100.0000 mg | ORAL_TABLET | Freq: Every day | ORAL | Status: DC
  Administered 2012-12-15 – 2012-12-19 (×5): 100 mg via ORAL
  Filled 2012-12-15 (×6): qty 1

## 2012-12-15 MED ORDER — BUPIVACAINE HCL (PF) 0.5 % IJ SOLN
INTRAMUSCULAR | Status: DC | PRN
  Administered 2012-12-15: 9.5 mL

## 2012-12-15 MED ORDER — ACETAMINOPHEN 325 MG PO TABS
650.0000 mg | ORAL_TABLET | ORAL | Status: DC | PRN
  Administered 2012-12-18: 650 mg via ORAL
  Filled 2012-12-15: qty 2

## 2012-12-15 MED ORDER — FENTANYL CITRATE 0.05 MG/ML IJ SOLN
INTRAMUSCULAR | Status: AC
  Filled 2012-12-15: qty 2

## 2012-12-15 MED ORDER — CEFAZOLIN SODIUM 1-5 GM-% IV SOLN
1.0000 g | Freq: Three times a day (TID) | INTRAVENOUS | Status: AC
  Administered 2012-12-15 – 2012-12-16 (×2): 1 g via INTRAVENOUS
  Filled 2012-12-15 (×4): qty 50

## 2012-12-15 MED ORDER — ATORVASTATIN CALCIUM 40 MG PO TABS
40.0000 mg | ORAL_TABLET | Freq: Every day | ORAL | Status: DC
  Administered 2012-12-15 – 2012-12-19 (×5): 40 mg via ORAL
  Filled 2012-12-15 (×6): qty 1

## 2012-12-15 MED ORDER — FENTANYL CITRATE 0.05 MG/ML IJ SOLN
25.0000 ug | INTRAMUSCULAR | Status: DC | PRN
  Administered 2012-12-15 (×3): 50 ug via INTRAVENOUS

## 2012-12-15 MED ORDER — MIDAZOLAM HCL 5 MG/5ML IJ SOLN
INTRAMUSCULAR | Status: DC | PRN
  Administered 2012-12-15: 2 mg via INTRAVENOUS

## 2012-12-15 MED ORDER — SODIUM CHLORIDE 0.9 % IR SOLN
Status: DC | PRN
  Administered 2012-12-15: 09:00:00

## 2012-12-15 MED ORDER — FLEET ENEMA 7-19 GM/118ML RE ENEM: 1.0000 | ENEMA | Freq: Once | RECTAL | Status: AC | PRN

## 2012-12-15 MED ORDER — ACETAMINOPHEN 650 MG RE SUPP: 650.0000 mg | RECTAL | Status: DC | PRN

## 2012-12-15 MED ORDER — KETOROLAC TROMETHAMINE 15 MG/ML IJ SOLN
15.0000 mg | Freq: Four times a day (QID) | INTRAMUSCULAR | Status: AC
  Administered 2012-12-15 – 2012-12-16 (×5): 15 mg via INTRAVENOUS
  Filled 2012-12-15 (×6): qty 1

## 2012-12-15 MED ORDER — LISINOPRIL 10 MG PO TABS
10.0000 mg | ORAL_TABLET | Freq: Every day | ORAL | Status: DC
  Administered 2012-12-15 – 2012-12-20 (×6): 10 mg via ORAL
  Filled 2012-12-15 (×6): qty 1

## 2012-12-15 MED ORDER — SENNA 8.6 MG PO TABS
1.0000 | ORAL_TABLET | Freq: Two times a day (BID) | ORAL | Status: DC
  Administered 2012-12-15 – 2012-12-20 (×10): 8.6 mg via ORAL
  Filled 2012-12-15 (×12): qty 1

## 2012-12-15 MED ORDER — NEOSTIGMINE METHYLSULFATE 1 MG/ML IJ SOLN
INTRAMUSCULAR | Status: DC | PRN
  Administered 2012-12-15: 4 mg via INTRAVENOUS

## 2012-12-15 MED ORDER — EPHEDRINE SULFATE 50 MG/ML IJ SOLN
INTRAMUSCULAR | Status: DC | PRN
  Administered 2012-12-15: 10 mg via INTRAVENOUS

## 2012-12-15 MED ORDER — PANTOPRAZOLE SODIUM 40 MG PO TBEC
40.0000 mg | DELAYED_RELEASE_TABLET | Freq: Every day | ORAL | Status: DC
  Administered 2012-12-15 – 2012-12-20 (×6): 40 mg via ORAL
  Filled 2012-12-15 (×5): qty 1

## 2012-12-15 MED ORDER — ONDANSETRON HCL 4 MG/2ML IJ SOLN
INTRAMUSCULAR | Status: AC
  Filled 2012-12-15: qty 2

## 2012-12-15 MED ORDER — SODIUM CHLORIDE 0.9 % IV SOLN
INTRAVENOUS | Status: DC | PRN
  Administered 2012-12-15: 14:00:00 via INTRAVENOUS

## 2012-12-15 MED ORDER — ATENOLOL 25 MG PO TABS
25.0000 mg | ORAL_TABLET | Freq: Every day | ORAL | Status: DC
  Administered 2012-12-15 – 2012-12-20 (×6): 25 mg via ORAL
  Filled 2012-12-15 (×6): qty 1

## 2012-12-15 MED ORDER — PHENYLEPHRINE HCL 10 MG/ML IJ SOLN
INTRAMUSCULAR | Status: DC | PRN
  Administered 2012-12-15 (×3): 120 ug via INTRAVENOUS

## 2012-12-15 MED ORDER — ONDANSETRON HCL 4 MG/2ML IJ SOLN
4.0000 mg | INTRAMUSCULAR | Status: DC | PRN
  Administered 2012-12-15: 4 mg via INTRAVENOUS

## 2012-12-15 MED ORDER — DOCUSATE SODIUM 100 MG PO CAPS
100.0000 mg | ORAL_CAPSULE | Freq: Two times a day (BID) | ORAL | Status: DC
  Administered 2012-12-15 – 2012-12-20 (×10): 100 mg via ORAL
  Filled 2012-12-15 (×10): qty 1

## 2012-12-15 MED ORDER — METHOCARBAMOL 100 MG/ML IJ SOLN
500.0000 mg | Freq: Four times a day (QID) | INTRAVENOUS | Status: DC | PRN
  Filled 2012-12-15: qty 5

## 2012-12-15 MED ORDER — METHOCARBAMOL 500 MG PO TABS
500.0000 mg | ORAL_TABLET | Freq: Four times a day (QID) | ORAL | Status: DC | PRN
  Administered 2012-12-16 – 2012-12-20 (×8): 500 mg via ORAL
  Filled 2012-12-15 (×8): qty 1

## 2012-12-15 MED ORDER — GELATIN ABSORBABLE MT POWD
OROMUCOSAL | Status: DC | PRN
  Administered 2012-12-15: 09:00:00 via TOPICAL

## 2012-12-15 MED ORDER — THROMBIN 20000 UNITS EX SOLR
CUTANEOUS | Status: DC | PRN
  Administered 2012-12-15 (×2): via TOPICAL

## 2012-12-15 MED ORDER — SODIUM CHLORIDE 0.9 % IJ SOLN: 3.0000 mL | INTRAMUSCULAR | Status: DC | PRN

## 2012-12-15 MED ORDER — FENTANYL CITRATE 0.05 MG/ML IJ SOLN
INTRAMUSCULAR | Status: DC | PRN
  Administered 2012-12-15 (×3): 50 ug via INTRAVENOUS
  Administered 2012-12-15: 150 ug via INTRAVENOUS
  Administered 2012-12-15 (×2): 50 ug via INTRAVENOUS

## 2012-12-15 MED ORDER — ONDANSETRON HCL 4 MG/2ML IJ SOLN
INTRAMUSCULAR | Status: DC | PRN
  Administered 2012-12-15: 4 mg via INTRAVENOUS

## 2012-12-15 MED ORDER — CEFAZOLIN SODIUM-DEXTROSE 2-3 GM-% IV SOLR
INTRAVENOUS | Status: AC
  Filled 2012-12-15: qty 50

## 2012-12-15 MED ORDER — DEXAMETHASONE SODIUM PHOSPHATE 10 MG/ML IJ SOLN
INTRAMUSCULAR | Status: DC | PRN
  Administered 2012-12-15: 10 mg via INTRAVENOUS

## 2012-12-15 MED ORDER — ALUM & MAG HYDROXIDE-SIMETH 200-200-20 MG/5ML PO SUSP
30.0000 mL | Freq: Four times a day (QID) | ORAL | Status: DC | PRN
  Administered 2012-12-16 – 2012-12-19 (×4): 30 mL via ORAL
  Filled 2012-12-15 (×3): qty 30

## 2012-12-15 MED ORDER — ALPRAZOLAM 0.5 MG PO TABS
0.5000 mg | ORAL_TABLET | Freq: Every evening | ORAL | Status: DC | PRN
  Administered 2012-12-15 – 2012-12-19 (×4): 0.5 mg via ORAL
  Filled 2012-12-15 (×4): qty 1

## 2012-12-15 MED ORDER — OXYCODONE-ACETAMINOPHEN 5-325 MG PO TABS
1.0000 | ORAL_TABLET | ORAL | Status: DC | PRN
  Administered 2012-12-16 – 2012-12-17 (×8): 2 via ORAL
  Administered 2012-12-17 (×2): 1 via ORAL
  Administered 2012-12-17 – 2012-12-19 (×9): 2 via ORAL
  Administered 2012-12-19: 1 via ORAL
  Administered 2012-12-19 – 2012-12-20 (×4): 2 via ORAL
  Filled 2012-12-15 (×10): qty 2
  Filled 2012-12-15: qty 1
  Filled 2012-12-15 (×3): qty 2
  Filled 2012-12-15: qty 1
  Filled 2012-12-15 (×5): qty 2
  Filled 2012-12-15: qty 1
  Filled 2012-12-15 (×3): qty 2

## 2012-12-15 MED ORDER — LACTATED RINGERS IV SOLN
INTRAVENOUS | Status: DC | PRN
  Administered 2012-12-15 (×4): via INTRAVENOUS

## 2012-12-15 MED ORDER — PHENOL 1.4 % MT LIQD: 1.0000 | OROMUCOSAL | Status: DC | PRN

## 2012-12-15 MED ORDER — GEMFIBROZIL 600 MG PO TABS
600.0000 mg | ORAL_TABLET | Freq: Two times a day (BID) | ORAL | Status: DC
  Administered 2012-12-15 – 2012-12-20 (×9): 600 mg via ORAL
  Filled 2012-12-15 (×12): qty 1

## 2012-12-15 MED ORDER — ALBUMIN HUMAN 5 % IV SOLN
INTRAVENOUS | Status: DC | PRN
  Administered 2012-12-15: 10:00:00 via INTRAVENOUS

## 2012-12-15 MED ORDER — LORATADINE 10 MG PO TABS
10.0000 mg | ORAL_TABLET | Freq: Every day | ORAL | Status: DC | PRN
  Filled 2012-12-15: qty 1

## 2012-12-15 MED ORDER — BISACODYL 10 MG RE SUPP
10.0000 mg | Freq: Every day | RECTAL | Status: DC | PRN
  Administered 2012-12-18: 10 mg via RECTAL
  Filled 2012-12-15: qty 1

## 2012-12-15 SURGICAL SUPPLY — 68 items
BAG DECANTER FOR FLEXI CONT (MISCELLANEOUS) ×2 IMPLANT
BLADE SURG ROTATE 9660 (MISCELLANEOUS) IMPLANT
BUR MATCHSTICK NEURO 3.0 LAGG (BURR) ×2 IMPLANT
CAGE 13MM (Cage) ×4 IMPLANT
CANISTER SUCT 3000ML (MISCELLANEOUS) ×2 IMPLANT
CONT SPEC 4OZ CLIKSEAL STRL BL (MISCELLANEOUS) ×4 IMPLANT
COVER BACK TABLE 24X17X13 BIG (DRAPES) IMPLANT
COVER TABLE BACK 60X90 (DRAPES) ×2 IMPLANT
CROSSLINK MEDIUM (Cage) ×2 IMPLANT
DECANTER SPIKE VIAL GLASS SM (MISCELLANEOUS) ×2 IMPLANT
DERMABOND ADHESIVE PROPEN (GAUZE/BANDAGES/DRESSINGS) ×1
DERMABOND ADVANCED (GAUZE/BANDAGES/DRESSINGS) ×2
DERMABOND ADVANCED .7 DNX12 (GAUZE/BANDAGES/DRESSINGS) ×2 IMPLANT
DERMABOND ADVANCED .7 DNX6 (GAUZE/BANDAGES/DRESSINGS) ×1 IMPLANT
DRAPE C-ARM 42X72 X-RAY (DRAPES) ×4 IMPLANT
DRAPE LAPAROTOMY 100X72X124 (DRAPES) ×2 IMPLANT
DRAPE POUCH INSTRU U-SHP 10X18 (DRAPES) ×2 IMPLANT
DRAPE PROXIMA HALF (DRAPES) ×2 IMPLANT
DRSG OPSITE POSTOP 4X6 (GAUZE/BANDAGES/DRESSINGS) ×2 IMPLANT
DRSG OPSITE POSTOP 4X8 (GAUZE/BANDAGES/DRESSINGS) ×2 IMPLANT
DURAPREP 26ML APPLICATOR (WOUND CARE) ×2 IMPLANT
ELECT REM PT RETURN 9FT ADLT (ELECTROSURGICAL) ×2
ELECTRODE REM PT RTRN 9FT ADLT (ELECTROSURGICAL) ×1 IMPLANT
GAUZE SPONGE 4X4 16PLY XRAY LF (GAUZE/BANDAGES/DRESSINGS) ×6 IMPLANT
GLOVE BIOGEL PI IND STRL 7.5 (GLOVE) ×2 IMPLANT
GLOVE BIOGEL PI IND STRL 8.5 (GLOVE) ×4 IMPLANT
GLOVE BIOGEL PI INDICATOR 7.5 (GLOVE) ×2
GLOVE BIOGEL PI INDICATOR 8.5 (GLOVE) ×4
GLOVE ECLIPSE 7.5 STRL STRAW (GLOVE) ×6 IMPLANT
GLOVE ECLIPSE 8.5 STRL (GLOVE) ×4 IMPLANT
GLOVE EXAM NITRILE LRG STRL (GLOVE) IMPLANT
GLOVE EXAM NITRILE MD LF STRL (GLOVE) IMPLANT
GLOVE EXAM NITRILE XL STR (GLOVE) IMPLANT
GLOVE EXAM NITRILE XS STR PU (GLOVE) IMPLANT
GLOVE SURG SS PI 8.0 STRL IVOR (GLOVE) ×12 IMPLANT
GOWN BRE IMP SLV AUR LG STRL (GOWN DISPOSABLE) ×2 IMPLANT
GOWN BRE IMP SLV AUR XL STRL (GOWN DISPOSABLE) ×2 IMPLANT
GOWN STRL REIN 2XL LVL4 (GOWN DISPOSABLE) ×10 IMPLANT
HEMOSTAT POWDER SURGIFOAM 1G (HEMOSTASIS) ×2 IMPLANT
KIT BASIN OR (CUSTOM PROCEDURE TRAY) ×2 IMPLANT
KIT ROOM TURNOVER OR (KITS) ×2 IMPLANT
MILL MEDIUM DISP (BLADE) ×2 IMPLANT
NEEDLE HYPO 22GX1.5 SAFETY (NEEDLE) ×2 IMPLANT
NEEDLE SPNL 18GX3.5 QUINCKE PK (NEEDLE) ×2 IMPLANT
NS IRRIG 1000ML POUR BTL (IV SOLUTION) ×2 IMPLANT
PACK FOAM VITOSS 10CC (Orthopedic Implant) ×2 IMPLANT
PACK LAMINECTOMY NEURO (CUSTOM PROCEDURE TRAY) ×2 IMPLANT
PAD ARMBOARD 7.5X6 YLW CONV (MISCELLANEOUS) ×6 IMPLANT
PATTIES SURGICAL .5 X1 (DISPOSABLE) ×2 IMPLANT
PEEK PLIF NOVEL 9X25X10 (Peek) ×8 IMPLANT
ROD 90MM (Rod) ×4 IMPLANT
SCREW POLYAX 6.5X45MM (Screw) ×12 IMPLANT
SCREW SET SPINAL STD HEXALOBE (Screw) ×12 IMPLANT
SPONGE GAUZE 4X4 12PLY (GAUZE/BANDAGES/DRESSINGS) ×2 IMPLANT
SPONGE LAP 4X18 X RAY DECT (DISPOSABLE) ×2 IMPLANT
SPONGE SURGIFOAM ABS GEL 100 (HEMOSTASIS) ×4 IMPLANT
SUT PROLENE 6 0 BV (SUTURE) ×6 IMPLANT
SUT VIC AB 1 CT1 18XBRD ANBCTR (SUTURE) ×3 IMPLANT
SUT VIC AB 1 CT1 8-18 (SUTURE) ×3
SUT VIC AB 2-0 CP2 18 (SUTURE) ×6 IMPLANT
SUT VIC AB 3-0 SH 8-18 (SUTURE) ×6 IMPLANT
SYR 20ML ECCENTRIC (SYRINGE) ×2 IMPLANT
SYR 3ML LL SCALE MARK (SYRINGE) ×14 IMPLANT
TOWEL OR 17X24 6PK STRL BLUE (TOWEL DISPOSABLE) ×2 IMPLANT
TOWEL OR 17X26 10 PK STRL BLUE (TOWEL DISPOSABLE) ×2 IMPLANT
TRAP SPECIMEN MUCOUS 40CC (MISCELLANEOUS) ×2 IMPLANT
TRAY FOLEY CATH 14FRSI W/METER (CATHETERS) ×2 IMPLANT
WATER STERILE IRR 1000ML POUR (IV SOLUTION) ×2 IMPLANT

## 2012-12-15 NOTE — Op Note (Signed)
Date of surgery: 12/15/2012 Preoperative diagnosis: Thoracic spondylosis with myelopathy T10-T11. 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 Postoperative diagnosis: Thoracic spondylosis with myelopathy T10-T11, degenerative spondylosis and stenosis L2-3 L3-4, spondylolisthesis L4-L5, lumbar radiculopathy, neurogenic claudication Procedure: #1 thoracic laminectomy T10-T11 decompression of spinal cord. #2 laminectomy L2 L3-L4 decompression of L2 L3-L4 and L5 nerve roots aren't for stenosis with work greater than require for simple posterior interbody arthrodesis. Posterior lumbar interbody arthrodesis using peek spacers local autograft and allograft L2-3 L3-4 L4-5. Segmental fixation L2-L5 with pedicle screws posterior lateral arthrodesis with local autograft and allograft L2-L5.  Surgeon: Barnett Abu First assistant: Lisbeth Renshaw Anesthesia: Gen. endotracheal Indications: Katie Booker is a 67 year old individual who's had significant problems with weakness in her back and pain in her lower extremities and her back. An MRI of the lumbar spine demonstrated that she had advanced spondylitic changes at L2-3 L3-4 noting some central canal stenosis and lateral recess stenosis severe stenosis was noted L4-L5 where there is a grade 1 spondylolisthesis. At the edge of the MRI was noted that the patient had evidence of thoracic spinal cord compression at the level of T10-T11 area an MRI of the thoracic spine and the cervical spine was performed. This revealed the presence of significant spondylosis at C5-C6 with cord flattening and lesion at T10-T11 was identified has shown signs of cord compression with myelomalacia within the cord. Patient was advised regarding the need for decompression of the thoracic spinal cord and also decompression of her spondylitic stenosis at L2-3-3 4 and L4-5. She is now taken to the operating room for  this procedure.   Procedure: The patient was brought to the operating room supine on a stretcher. After the smooth induction of general endotracheal anesthesia, she was turned prone onto the operating table. The bony prominences were appropriately padded and protected. The back was scrubbed with alcohol and DuraPrep and draped in a sterile fashion. A midline incision was created over the region of T10-T11 after a localizing radiograph with needles in place identified T10-T11 disc space positively and also the L4 laminar arch positively area the dissection was carried down a subperiosteal fashion with a self-retaining retractor being placed in the wound to maintain retraction on T10-T11 area then the laminar arch of T10 along with the spinous process was resected using a Leksell rongeur. The T9 spinous process was removed part partially also. The lamina of T10 was then resected in total using a high-speed drill and 2 mm bit to cut through the edges of the laminar arch. The superior portion the lamina of T11 was removed in a similar fashion and then the entirety of the laminar arch of T11 was removed to the junction of T12. A SLAP for good decompression centrally area there was noted to be a substantial amount of redundant ligamentous material in the gap between T10 and T11 on either lateral aspect. This was resected with a 3 mm and 4 mm Kerrison punch. The foramen for the nerve roots were then sounded out. At the superior aspect on the left side of the laminectomy at T. 10 was identified a small dural encourage and. This required removal  of a portion of the inferior lamina of T9 to expose the dura adequately to place 2 6-0 Prolene sutures to close this leak. The leak was closed well with no fluid identified to a Valsalva of 40 cm of water. With this the decompression was inspected and noted to be full and  clear through the central canal. Hemostasis in the soft tissues obtained meticulously and Surgifoam was placed  in the epidural space. Once this was accomplished the wound was irrigated copiously with antibiotic irrigating solution, then the thoracic of dorsal fascia was closed with #1 Vicryl, 2-0 Vicryl was used in the subcutaneous tissues, and 3-0 Vicryl is used to close the subcuticular skin. Dermabond was placed on the skin.  Procedure #2: Attention was next turned to the area of L4-1 linear incision was made around the needle mark that marked the laminar arch of L4. The dissection was carried down to the lumbar dorsal fascia. The fascia was opened on either side of the midline. A subperiosteal dissection was carried out on the laminar arches of L4 L5-L3 and L2. The dissection was carried out over the facet joints. Self-retaining retractors were placed in the wound. The dissection was then further carried out to expose the transverse processes of L2 L3-L4 and L5. These were decorticated and then packed off for later use in grafting. Laminectomy was then carried out removing the lamina of L2 out to and including the complete facet complex of L2-3 the entire lamina and spinous process complex of L3 and L4 were also removed L5 was undercut so as to create large foraminotomies for the L5 nerve root. L5 nerve roots were noted be stenotic and a passage into the foramen once the laminectomies were completed the individually decompressed the L2 nerve root superiorly the L3 nerve roots the L4 nerve roots and the side from significant stenosis secondary to redundant ligamentous tissue in this region. The disc spaces were then identified and isolated. At L4-L5 there was noted be a significant step off this caused and contribute to stenosis of the L4 nerve root which also required further decompression. Discectomy was then performed at L4-L5 using a series of curettes and rongeurs to evacuate the entirety of the disc space at L4 L5-1 disc space spreader was then used to increase the size of the interspace and allow curettage of the  endplates. A box cutter measuring 12 mm in size was used to shape the endplates and remove the remainders of any articular cartilage. With this the interspace was filled with bone that was harvested from the laminar arches and then to 13 mm peek cages were placed into the interspace at L4-L5. At L3 L4-1 similar procedure was carried out entirety of the disc was removed but here the space was considerably smaller only allowing for a 10 mm peek spacer to be placed in the interspace along with a syringe full of autograft measuring a total of 3 cc. At L2-L3 the space was also narrowed but after adequately decompressing and releasing ligaments were able to place 10 mm peek spacers into the interspace. The lateral gutters which had been previously packed off and decorticated were now filled with the remainder of the oncology is bone from the laminectomy mixed with the cost bone sponge. Then using fluoroscopic guidance replaced pedicle screws in L2 L3-L4 and L5. 6.5 x 45 mm screws were placed in L2 bilaterally on the left side 6.5 x 45 mm screw was placed on L3 on the right side similar size screw was placed in L4 and bilateral screws were placed in L5 measuring the same length. Then 90 mm precontoured rods were placed between the screw saddles and tightened to their final position. A transverse connector was applied. Hemostasis was checked in the deep portions of the wound and verified. The pads of the  L2 the L3 the L4 and the L5 nerve root were each inspected and found to be free and clear in the foramen. Final radiographs were obtained in the AP and lateral projections. Then the retractors were removed, the lumbar dorsal fascia was closed with #1 Vicryl in interrupted fashion. 2-0 Vicryl was used in the subcutaneous tissue. 3-0 Vicryl was used to close the subcuticular skin. Blood loss is estimated at about thousand cc. 300 cc of Cell Saver blood was returned to the patient.

## 2012-12-15 NOTE — Preoperative (Signed)
Beta Blockers   Reason not to administer Beta Blockers:Not Applicable, Pt took Atenolol this am

## 2012-12-15 NOTE — Progress Notes (Signed)
Patient ID: Katie Booker, female   DOB: [birth year], 67 y.o.   MRN: [id number] Vital signs are stable. Patient is alert and awake. Moving the lower extremities well. Incisions are clean and dry. Mobilize as tolerated over week.

## 2012-12-15 NOTE — Anesthesia Preprocedure Evaluation (Addendum)
Anesthesia Evaluation  Patient identified by MRN, date of birth, ID band Patient awake    Reviewed: Allergy & Precautions, H&P , NPO status , Patient's Chart, lab work & pertinent test results, reviewed documented beta blocker date and time   History of Anesthesia Complications Negative for: history of anesthetic complications  Airway Mallampati: II TM Distance: >3 FB Neck ROM: Full    Dental  (+) Dental Advisory Given and Teeth Intact   Pulmonary shortness of breath and with exertion, sleep apnea , former smoker,    Pulmonary exam normal       Cardiovascular hypertension, Pt. on medications and Pt. on home beta blockers     Neuro/Psych  Headaches, PSYCHIATRIC DISORDERS Anxiety Depression    GI/Hepatic Neg liver ROS, GERD-  Medicated and Controlled,  Endo/Other  diabetes, Type 2, Oral Hypoglycemic AgentsHypothyroidism Morbid obesity  Renal/GU      Musculoskeletal   Abdominal   Peds  Hematology   Anesthesia Other Findings   Reproductive/Obstetrics                         Anesthesia Physical Anesthesia Plan  ASA: III  Anesthesia Plan: General   Post-op Pain Management:    Induction: Intravenous  Airway Management Planned: Oral ETT  Additional Equipment:   Intra-op Plan:   Post-operative Plan: Extubation in OR  Informed Consent: I have reviewed the patients History and Physical, chart, labs and discussed the procedure including the risks, benefits and alternatives for the proposed anesthesia with the patient or authorized representative who has indicated his/her understanding and acceptance.   Dental advisory given  Plan Discussed with: CRNA, Anesthesiologist and Surgeon  Anesthesia Plan Comments:        Anesthesia Quick Evaluation

## 2012-12-15 NOTE — Anesthesia Postprocedure Evaluation (Signed)
Anesthesia Post Note  Patient: Katie Booker  Procedure(s) Performed: Procedure(s) (LRB): Thoracic ten-eleven Thoracic laminectomy (N/A) Lumbar two-three, Lumbar three-four, Lumbar four-five Posterior lumbar interbody fusion (N/A)  Anesthesia type: general  Patient location: PACU  Post pain: Pain level controlled  Post assessment: Patient's Cardiovascular Status Stable  Last Vitals:  Filed Vitals:   12/15/12 1515  BP: 140/62  Pulse: 105  Temp:   Resp: 12    Post vital signs: Reviewed and stable  Level of consciousness: sedated  Complications: No apparent anesthesia complications

## 2012-12-15 NOTE — Transfer of Care (Signed)
Immediate Anesthesia Transfer of Care Note  Patient: Katie Booker  Procedure(s) Performed: Procedure(s) with comments: Thoracic ten-eleven Thoracic laminectomy (N/A) - Thoracic ten-eleven Thoracic laminectomy Lumbar two-three, Lumbar three-four, Lumbar four-five Posterior lumbar interbody fusion (N/A) - Lumbar two-three, Lumbar three-four, Lumbar four-five Posterior lumbar interbody fusion  Patient Location: PACU  Anesthesia Type:General  Level of Consciousness: awake and patient cooperative  Airway & Oxygen Therapy: Patient Spontanous Breathing and Patient connected to nasal cannula oxygen  Post-op Assessment: Report given to PACU RN, Post -op Vital signs reviewed and stable and Patient moving all extremities X 4  Post vital signs: Reviewed and stable  Complications: No apparent anesthesia complications

## 2012-12-15 NOTE — Anesthesia Procedure Notes (Signed)
Procedure Name: Intubation Date/Time: 12/15/2012 7:54 AM Performed by: Rogelia Boga Pre-anesthesia Checklist: Patient identified, Emergency Drugs available, Suction available, Patient being monitored and Timeout performed Patient Re-evaluated:Patient Re-evaluated prior to inductionOxygen Delivery Method: Circle system utilized Preoxygenation: Pre-oxygenation with 100% oxygen Intubation Type: IV induction Ventilation: Mask ventilation without difficulty and Oral airway inserted - appropriate to patient size Laryngoscope Size: Mac and 4 Grade View: Grade II Tube type: Oral Tube size: 7.5 mm Number of attempts: 1 Airway Equipment and Method: Stylet Placement Confirmation: ETT inserted through vocal cords under direct vision,  positive ETCO2 and breath sounds checked- equal and bilateral Secured at: 21 cm Tube secured with: Tape Dental Injury: Teeth and Oropharynx as per pre-operative assessment

## 2012-12-15 NOTE — OR Nursing (Signed)
Procedure one thoracic ten-eleven thoracic laminectomy ended at 0930

## 2012-12-16 LAB — GLUCOSE, CAPILLARY
Glucose-Capillary: 125 mg/dL — ABNORMAL HIGH (ref 70–99)
Glucose-Capillary: 139 mg/dL — ABNORMAL HIGH (ref 70–99)

## 2012-12-16 LAB — CBC
HCT: 26.3 % — ABNORMAL LOW (ref 36.0–46.0)
MCH: 26.5 pg (ref 26.0–34.0)
MCV: 80.2 fL (ref 78.0–100.0)
Platelets: 151 10*3/uL (ref 150–400)
RBC: 3.28 MIL/uL — ABNORMAL LOW (ref 3.87–5.11)
RDW: 14.8 % (ref 11.5–15.5)
WBC: 8.5 10*3/uL (ref 4.0–10.5)

## 2012-12-16 NOTE — Evaluation (Signed)
Physical Therapy Evaluation Patient Details Name: Katie Booker MRN: [id number] DOB: September 28, 1945 Today's Date: 12/16/2012 Time: 7829-5621 PT Time Calculation (min): 30 min  PT Assessment / Plan / Recommendation History of Present Illness  pt presents with T10-11 Lami and L2-5 PLIF.    Clinical Impression  Pt very anxious and states she feels very tense.  Max cueing and re-direction needed for safe mobility and to maintain pt on task.  Pt indicates she normally takes Xanax at home and RN made aware of this.  Will continue to follow.      PT Assessment  Patient needs continued PT services    Follow Up Recommendations  SNF    Does the patient have the potential to tolerate intense rehabilitation      Barriers to Discharge        Equipment Recommendations  None recommended by PT    Recommendations for Other Services     Frequency Min 5X/week    Precautions / Restrictions Precautions Precautions: Back Precaution Booklet Issued: No Precaution Comments: Needs a precautions sheet.   Required Braces or Orthoses: Spinal Brace Spinal Brace: Lumbar corset;Applied in sitting position Restrictions Weight Bearing Restrictions: No   Pertinent Vitals/Pain "Terrible"  Pt was premedicated prior to PT.        Mobility  Bed Mobility Bed Mobility: Sit to Sidelying Right Sit to Sidelying Right: 3: Mod assist Details for Bed Mobility Assistance: Max cueing for ste-by-step through movement.  pt anxious and needs cueing repeated at times.   Transfers Transfers: Sit to Stand;Stand to Sit Sit to Stand: 4: Min assist;With upper extremity assist;From chair/3-in-1;With armrests Stand to Sit: 4: Min assist;With upper extremity assist;To bed Details for Transfer Assistance: Max cueing for safe technique and attending to task.  pt anxious and needs re-direction back to task and to open eyes.   Ambulation/Gait Ambulation/Gait Assistance: 4: Min assist Ambulation Distance (Feet): 6  Feet Assistive device: 4-wheeled walker Ambulation/Gait Assistance Details: cues for use of 4WW and adjusting height of 4WW.  pt needs max cueing for sequencing mobility and attending to task and opening eyes.   Gait Pattern: Step-through pattern;Decreased stride length Stairs: No Wheelchair Mobility Wheelchair Mobility: No    Exercises     PT Diagnosis: Difficulty walking;Acute pain  PT Problem List: Decreased strength;Decreased activity tolerance;Decreased balance;Decreased mobility;Decreased knowledge of use of DME;Decreased cognition;Decreased knowledge of precautions;Pain PT Treatment Interventions: DME instruction;Gait training;Stair training;Functional mobility training;Therapeutic activities;Therapeutic exercise;Balance training;Patient/family education     PT Goals(Current goals can be found in the care plan section) Acute Rehab PT Goals Patient Stated Goal: Home PT Goal Formulation: With patient Time For Goal Achievement: 12/30/12 Potential to Achieve Goals: Good  Visit Information  Last PT Received On: 12/16/12 Assistance Needed: +1 History of Present Illness: pt presents with T10-11 Lami and L2-5 PLIF.         Prior Functioning  Home Living Family/patient expects to be discharged to:: Skilled nursing facility Living Arrangements: Alone Additional Comments: pt plans D/C to [facility].   Prior Function Level of Independence: Independent Communication Communication: No difficulties    Cognition  Cognition Arousal/Alertness: Awake/alert Behavior During Therapy: Anxious Overall Cognitive Status: Impaired/Different from baseline Area of Impairment: Attention;Safety/judgement;Awareness;Problem solving Current Attention Level: Selective Memory: Decreased recall of precautions Safety/Judgement: Decreased awareness of safety;Decreased awareness of deficits Awareness: Emergent;Anticipatory Problem Solving: Slow processing;Difficulty sequencing General Comments: ?  if deficits are more related to anxiety as pt indicates she normally takes Xanax at home to help calm her and  keep her focused.      Extremity/Trunk Assessment Upper Extremity Assessment Upper Extremity Assessment: Defer to OT evaluation Lower Extremity Assessment Lower Extremity Assessment: Generalized weakness   Balance Balance Balance Assessed: Yes Static Sitting Balance Static Sitting - Balance Support: Bilateral upper extremity supported;Feet supported Static Sitting - Level of Assistance: 5: Stand by assistance  End of Session PT - End of Session Equipment Utilized During Treatment: Gait belt;Back brace Activity Tolerance: Patient limited by pain;Patient limited by fatigue (Limited by anxiety mostly.  ) Patient left: in bed;with call bell/phone within reach Nurse Communication: Mobility status  GP     Sunny Schlein, [place] [phone] 12/16/2012, 11:25 AM

## 2012-12-16 NOTE — Progress Notes (Signed)
Patient ID: Katie Booker, female   DOB: [birth year], 67 y.o.   MRN: [id number] Subjective:  the patient is alert and pleasant. She looks well.  Objective: Vital signs in last 24 hours: Temp:  [98.2 F (36.8 C)-101 F (38.3 C)] 100.7 F (38.2 C) (10/25 0828) Pulse Rate:  [57-106] 102 (10/25 0828) Resp:  [12-20] 20 (10/25 0828) BP: (99-151)/(46-77) 99/46 mmHg (10/25 0828) SpO2:  [92 %-100 %] 95 % (10/25 0828)  Intake/Output from previous day: 10/24 0701 - 10/25 0700 In: 4080 [I.V.:3600; Blood:230; IV Piggyback:250] Out: 2100 [Urine:1500; Blood:600] Intake/Output this shift: Total I/O In: 240 [P.O.:240] Out: -   Physical exam the patient is alert and oriented. She is moving her lower extremities well.  Lab Results:  Recent Labs  12/16/12 0615  WBC 8.5  HGB 8.7*  HCT 26.3*  PLT 151   BMET No results found for this basename: NA, K, CL, CO2, GLUCOSE, BUN, CREATININE, CALCIUM,  in the last 72 hours  Studies/Results: Dg Lumbar Spine 2-3 Views  12/15/2012   CLINICAL DATA:  Lumbar fusion  EXAM: LUMBAR SPINE - 2-3 VIEW; DG C-ARM 1-60 MIN  COMPARISON:  11/01/2012  FINDINGS: 2 intraoperative fluoroscopic spot images document changes of PLIF L2-L5 with pedicle screws bilateral L2 and L5, left L3, right L4, and vertical interconnecting hardware. Graft markers project in the L2-3, L3-4, and L4-5 interspaces. There is narrowing L5-S1 with vacuum phenomenon.  IMPRESSION: 1. PLIF L2-L5.   Electronically Signed   By: Oley Balm M.D.   On: 12/15/2012 14:24   Dg Lumbar Spine 2-3 Views  12/15/2012   CLINICAL DATA:  Cross-table lateral lumbar spine radiograph for operative localization.  EXAM: LUMBAR SPINE - 2-3 VIEW  COMPARISON:  Lumbar MRI, 08/27/2012  FINDINGS: A surgical needle lies posterior to the mid L4 vertebrae, superimposed over the superior margin of the posterior L4 spinous process.  Another smaller caliber surgical needle has its tip positioned posterior to the T10-T11 disc  interspace.  Followup image shows placement of 2 surgical probes through skin retractors, 1 posterior to the upper pedicle of L4 and the 2nd posterior to the L4-L5 disc space.  There are disk degenerative changes throughout the visualized spine with relative sparing of the L1-L2 level, stable from the prior MRI.  IMPRESSION: Surgical localization images as described.   Electronically Signed   By: Amie Portland M.D.   On: 12/15/2012 11:44   Dg C-arm 1-60 Min  12/15/2012   CLINICAL DATA:  Lumbar fusion  EXAM: LUMBAR SPINE - 2-3 VIEW; DG C-ARM 1-60 MIN  COMPARISON:  11/01/2012  FINDINGS: 2 intraoperative fluoroscopic spot images document changes of PLIF L2-L5 with pedicle screws bilateral L2 and L5, left L3, right L4, and vertical interconnecting hardware. Graft markers project in the L2-3, L3-4, and L4-5 interspaces. There is narrowing L5-S1 with vacuum phenomenon.  IMPRESSION: 1. PLIF L2-L5.   Electronically Signed   By: Oley Balm M.D.   On: 12/15/2012 14:24    Assessment/Plan: Postop day 1: We will mobilize the patient with PT and OT.  Fever: This is likely secondary to atelectasis. If she continues to have fever we will culture her as appropriate.   LOS: 1 day     Katie Booker D 12/16/2012, 9:18 AM

## 2012-12-16 NOTE — Evaluation (Signed)
Occupational Therapy Evaluation Patient Details Name: Katie Booker MRN: [id number] DOB: Dec 26, 1945 Today's Date: 12/16/2012 Time: 4098-1191 OT Time Calculation (min): 38 min  OT Assessment / Plan / Recommendation History of present illness pt presents with T10-11 Lami and L2-5 PLIF.     Clinical Impression   Pt presents with below problem list. Pt independent with ADLs, PTA. Pt will benefit from acute OT to increase independence prior to d/c. Recommending SNF for d/c.     OT Assessment  Patient needs continued OT Services    Follow Up Recommendations  SNF;Supervision/Assistance - 24 hour    Barriers to Discharge      Equipment Recommendations  Other (comment) (defer to SNF)    Recommendations for Other Services    Frequency  Min 2X/week    Precautions / Restrictions Precautions Precautions: Back Precaution Booklet Issued: No Precaution Comments: Needs a precautions sheet.   Required Braces or Orthoses: Spinal Brace Spinal Brace: Lumbar corset;Applied in sitting position Restrictions Weight Bearing Restrictions: No   Pertinent Vitals/Pain Pain 7-8/10, but 10/10 at beginning of session. Increased activity.     ADL  Eating/Feeding: Set up Where Assessed - Eating/Feeding: Chair Grooming: Minimal assistance Where Assessed - Grooming: Unsupported sitting Upper Body Bathing: Minimal assistance Where Assessed - Upper Body Bathing: Unsupported sitting Lower Body Bathing: Maximal assistance Where Assessed - Lower Body Bathing: Supported sit to stand Upper Body Dressing: Minimal assistance (for clothing; OT donned brace and educated) Where Assessed - Upper Body Dressing: Unsupported sitting Lower Body Dressing: Maximal assistance Where Assessed - Lower Body Dressing: Unsupported sit to stand Toilet Transfer: Minimal assistance Toilet Transfer Method: Sit to stand Toilet Transfer Equipment: Other (comment) (from bed) Toileting - Clothing Manipulation and Hygiene: Maximal  assistance Where Assessed - Glass blower/designer Manipulation and Hygiene: Other (comment);Lean right and/or left (sit to stand from bed) Tub/Shower Transfer Method: Not assessed Equipment Used: Gait belt;Back brace;Other (comment);Sock aid;Long-handled shoe horn;Long-handled sponge;Reacher (rollator) Transfers/Ambulation Related to ADLs: Min A as she lost balance when ambulating to chair. Min A for sit <> stand transfers. ADL Comments: Pt in a lot of pain when OT entered room. Pt talking a lot during session requiring cues to continue to next step in session. Educated on use of toilet aid for hygiene. Educated on AE for LB ADLs.     OT Diagnosis: Acute pain  OT Problem List: Decreased strength;Decreased range of motion;Decreased activity tolerance;Impaired balance (sitting and/or standing);Decreased knowledge of use of DME or AE;Decreased cognition;Decreased knowledge of precautions;Pain;Impaired sensation OT Treatment Interventions: Self-care/ADL training;DME and/or AE instruction;Therapeutic activities;Patient/family education;Balance training   OT Goals(Current goals can be found in the care plan section) Acute Rehab OT Goals Patient Stated Goal: not stated OT Goal Formulation: With patient Time For Goal Achievement: 12/23/12 Potential to Achieve Goals: Good ADL Goals Pt Will Perform Grooming: standing;with set-up Pt Will Perform Lower Body Bathing: with set-up;with supervision;with adaptive equipment;sit to/from stand Pt Will Perform Lower Body Dressing: with set-up;with supervision;with adaptive equipment;sit to/from stand Pt Will Transfer to Toilet: ambulating;with supervision (3 in 1 over commode) Pt Will Perform Toileting - Clothing Manipulation and hygiene: with supervision;with set-up;with adaptive equipment;sit to/from stand Additional ADL Goal #1: Pt will be able to independently verbalize and demonstrate 3/3 back precautions.  Additional ADL Goal #2: Pt will be able to  independently donn back brace independently while maintaining back precautions.   Visit Information  Last OT Received On: 12/16/12 Assistance Needed: +1 History of Present Illness: pt presents with T10-11 Lami and L2-5  PLIF.         Prior Functioning     Home Living Family/patient expects to be discharged to:: Skilled nursing facility Living Arrangements: Alone Additional Comments: pt plans D/C to [facility].   Prior Function Level of Independence: Independent Communication Communication: No difficulties         Vision/Perception     Cognition  Cognition Arousal/Alertness: Awake/alert Behavior During Therapy: Anxious Overall Cognitive Status: No family/caregiver present to determine baseline cognitive functioning Area of Impairment: Attention Current Attention Level: Selective General Comments: Pt talking a lot during session requring cues to move to next task.     Extremity/Trunk Assessment Upper Extremity Assessment Upper Extremity Assessment: RUE deficits/detail;LUE deficits/detail RUE Deficits / Details: pain in back when raising arms to touch head RUE Sensation: decreased light touch LUE Deficits / Details: pain in back when raising arms to touch head LUE Sensation: decreased light touch Lower Extremity Assessment Lower Extremity Assessment: Defer to PT evaluation     Mobility Bed Mobility Bed Mobility: Rolling Right;Right Sidelying to Sit;Sitting - Scoot to [place] of Bed Rolling Right: 4: Min assist Right Sidelying to Sit: 3: Mod assist Sitting - Scoot to Edge of Bed: 4: Min guard Details for Bed Mobility Assistance: Cues for technique and assist to roll and to help with trunk when sitting up Transfers Transfers: Sit to Stand;Stand to Sit Sit to Stand: 4: Min assist;From bed Stand to Sit: 4: Min assist;To chair/3-in-1 Details for Transfer Assistance: cues for hand placement and technique.     Exercise        End of Session OT - End of  Session Equipment Utilized During Treatment: Gait belt;Rolling walker;Back brace Activity Tolerance: Patient limited by pain Patient left: in chair;with call bell/phone within reach  [employer] OTR/L [phone] 12/16/2012, 1:19 PM

## 2012-12-17 LAB — GLUCOSE, CAPILLARY
Glucose-Capillary: 113 mg/dL — ABNORMAL HIGH (ref 70–99)
Glucose-Capillary: 127 mg/dL — ABNORMAL HIGH (ref 70–99)
Glucose-Capillary: 137 mg/dL — ABNORMAL HIGH (ref 70–99)

## 2012-12-17 MED ORDER — ONDANSETRON HCL 4 MG PO TABS: 4.0000 mg | ORAL_TABLET | Freq: Three times a day (TID) | ORAL | Status: DC | PRN

## 2012-12-17 MED ORDER — BISACODYL 10 MG RE SUPP: 10.0000 mg | Freq: Every day | RECTAL | Status: DC | PRN

## 2012-12-17 MED ORDER — ONDANSETRON HCL 4 MG/2ML IJ SOLN
4.0000 mg | Freq: Four times a day (QID) | INTRAMUSCULAR | Status: DC | PRN
  Administered 2012-12-17: 4 mg via INTRAVENOUS
  Filled 2012-12-17: qty 2

## 2012-12-17 NOTE — Progress Notes (Signed)
No issues overnight. C/O back soreness, but doing well. Has been OOB to chair. Voiding without difficulty  EXAM:  BP 110/53  Pulse 104  Temp(Src) 99.2 F (37.3 C) (Oral)  Resp 20  Ht 5\' 2"  (1.575 m)  Wt 84.823 kg (187 lb)  BMI 34.19 kg/m2  SpO2 95%  Awake, alert, oriented  Speech fluent, appropriate  CN grossly intact  5/5 BUE/BLE  Wound c/d/i  IMPRESSION:  67 y.o. female POD# 2 s/p Thoracic lami, L2-5 decompression fusion  PLAN: - Cont to mobilize - Bowel regimen - PT/OT, upon d/c will need SNF

## 2012-12-18 ENCOUNTER — Encounter (HOSPITAL_COMMUNITY): Payer: Self-pay | Admitting: Neurological Surgery

## 2012-12-18 LAB — GLUCOSE, CAPILLARY: Glucose-Capillary: 126 mg/dL — ABNORMAL HIGH (ref 70–99)

## 2012-12-18 MED FILL — Sodium Chloride IV Soln 0.9%: INTRAVENOUS | Qty: 1000 | Status: AC

## 2012-12-18 MED FILL — Heparin Sodium (Porcine) Inj 1000 Unit/ML: INTRAMUSCULAR | Qty: 30 | Status: AC

## 2012-12-18 NOTE — Social Work (Signed)
Clinical Social Work Department BRIEF PSYCHOSOCIAL ASSESSMENT 12/18/2012  Patient:  Katie Booker, Katie Booker     Account Number:  [id number]     Admit date:  12/15/2012  Clinical Social Worker:  Robin Searing  Date/Time:  12/18/2012 11:11 AM  Referred by:  Physician  Date Referred:  12/18/2012 Referred for  SNF Placement   Other Referral:   Interview type:  Patient Other interview type:    PSYCHOSOCIAL DATA Living Status:  ALONE Admitted from facility:   Level of care:   Primary support name:  FRIENDS AND FAMILY Primary support relationship to patient:  FAMILY Degree of support available:   MINIMAL    CURRENT CONCERNS Current Concerns  Post-Acute Placement   Other Concerns:    SOCIAL WORK ASSESSMENT / PLAN Patient lying in bed- quite drowsy but was able to speak with me about d/c plans- she has pre-registered at [facility] and I have confirmed a bed for he there at d/c.   Assessment/plan status:  Other - See comment Other assessment/ plan:   FL2 and Pasarr for SNF   Information/referral to community resources:   SNF  EM[facility]  [facility]    PATIENT'S/FAMILY'S RESPONSE TO PLAN OF CARE: Patient agreeable to plans for SNF at [facility] as arranged pta- will await further planning per MD for d/c date.       Reece Levy, MSW, Theresia Majors [phone]

## 2012-12-18 NOTE — Clinical Social Work Psychosocial (Signed)
Clinical Social Work Department CLINICAL SOCIAL WORK PLACEMENT NOTE 12/18/2012  Patient:  Katie Booker, Katie Booker  Account Number:  [id number] Admit date:  12/15/2012  Clinical Social Worker:  Robin Searing  Date/time:  12/18/2012 02:08 PM  Clinical Social Work is seeking post-discharge placement for this patient at the following level of care:   SKILLED NURSING   (*CSW will update this form in Epic as items are completed)   12/18/2012  Patient/family provided with [facility] Department of Clinical Social Work's list of facilities offering this level of care within the geographic area requested by the patient (or if unable, by the patient's family).  12/18/2012  Patient/family informed of their freedom to choose among providers that offer the needed level of care, that participate in Medicare, Medicaid or managed care program needed by the patient, have an available bed and are willing to accept the patient.  12/18/2012  Patient/family informed of [facility]' ownership interest in [facility], as well as of the fact that they are under no obligation to receive care at this facility.  PASARR submitted to EDS on 12/18/2012 PASARR number received from EDS on 12/18/2012  FL2 transmitted to all facilities in geographic area requested by pt/family on  12/18/2012 FL2 transmitted to all facilities within larger geographic area on   Patient informed that his/her managed care company has contracts with or will negotiate with  certain facilities, including the following:     Patient/family informed of bed offers received:   Patient chooses bed at  Physician recommends and patient chooses bed at    Patient to be transferred to  on   Patient to be transferred to facility by   The following physician request were entered in Epic:   Additional Comments: Reece Levy, MSW, [place] Majors [phone]

## 2012-12-18 NOTE — Progress Notes (Signed)
Physical Therapy Treatment Patient Details Name: Katie Booker MRN: [id number] DOB: December 26, 1945 Today's Date: 12/18/2012 Time: 1308-6578 PT Time Calculation (min): 29 min  PT Assessment / Plan / Recommendation  History of Present Illness 67 y.o. female admitted to [facility] on 12/15/12 Thoracic lami, L2-5 decompression fusion.     PT Comments   Pt is POD #3 s/p lumbar surgery.  She is progressing slowly with her mobility, but progressing daily.  She continues to be appropriate for SNF placement at discharge.    Follow Up Recommendations  SNF     Does the patient have the potential to tolerate intense rehabilitation    NA  Barriers to Discharge   None      Equipment Recommendations  None recommended by PT    Recommendations for Other Services   None  Frequency Min 5X/week   Progress towards PT Goals Progress towards PT goals: Progressing toward goals  Plan Current plan remains appropriate    Precautions / Restrictions Precautions Precautions: Back Precaution Booklet Issued: Yes (comment) Precaution Comments: back sheet provided and reviewed.   Required Braces or Orthoses: Spinal Brace Spinal Brace: Lumbar corset;Applied in sitting position   Pertinent Vitals/Pain See vitals flow sheet.     Mobility  Bed Mobility Bed Mobility: Rolling Right;Right Sidelying to Sit;Sitting - Scoot to Edge of Bed Rolling Right: 6: Modified independent (Device/Increase time);With rail Right Sidelying to Sit: 6: Modified independent (Device/Increase time);With rails;HOB flat Sitting - Scoot to Edge of Bed: 6: Modified independent (Device/Increase time);With rail Details for Bed Mobility Assistance: heavy reliance on railing for support.  Min verbal cues for log roll technique.   Transfers Transfers: Sit to Stand;Stand to Sit Sit to Stand: 4: Min assist;With upper extremity assist;With armrests;From bed;From toilet Stand to Sit: 4: Min assist;With upper extremity assist;With armrests;To  chair/3-in-1;To toilet Details for Transfer Assistance: min assist to support trunk over weak legs and verbal cues needed for safe hand placement and safe RW use.   Ambulation/Gait Ambulation/Gait Assistance: 4: Min assist Ambulation Distance (Feet): 25 Feet Assistive device: 4-wheeled walker Ambulation/Gait Assistance Details: min assist to support trunk over weak and flexed left leg, cues to stay closer to RW and cues for safe RW/break use.   Gait Pattern: Step-through pattern;Decreased stance time - left;Decreased weight shift to right;Left flexed knee in stance;Shuffle;Trunk flexed Gait velocity: decreased      PT Goals (current goals can now be found in the care plan section) Acute Rehab PT Goals Patient Stated Goal: to get stronger.   Visit Information  Last PT Received On: 12/18/12 Assistance Needed: +1 History of Present Illness: 67 y.o. female admitted to [facility] on 12/15/12 Thoracic lami, L2-5 decompression fusion.      Subjective Data  Subjective: Pt reports she had difficulty remembering things before her surgery.   Patient Stated Goal: to get stronger.    Cognition  Cognition Arousal/Alertness: Awake/alert Behavior During Therapy: Anxious Overall Cognitive Status: No family/caregiver present to determine baseline cognitive functioning Area of Impairment: Attention Current Attention Level: Selective Memory: Decreased recall of precautions;Decreased short-term memory Safety/Judgement: Decreased awareness of deficits Awareness: Emergent;Anticipatory Problem Solving: Slow processing;Difficulty sequencing General Comments: It takes pt extra time to process and respond to questions and cues.  She functionally demonsrtated increased L leg weakness compared to right, but could not report to me that this was the case when asked.      Balance  Static Standing Balance Static Standing - Balance Support: Right upper extremity supported;During functional activity Static Standing -  Level of Assistance: 4: Min assist Static Standing - Comment/# of Minutes: min assist to stabilize pt for balance and help support trunk while pt wiped from the front.  She was unable to reach her rectum with this method and PT had to assist for peri care in the back.    End of Session PT - End of Session Equipment Utilized During Treatment: Gait belt;Back brace Activity Tolerance: Patient limited by fatigue;Patient limited by pain;Patient limited by lethargy Patient left: in chair;with call bell/phone within reach     [facility] B. Katie Booker, PT, DPT [phone]   12/18/2012, 3:44 PM

## 2012-12-18 NOTE — Progress Notes (Signed)
Utilization review completed.  

## 2012-12-19 LAB — GLUCOSE, CAPILLARY
Glucose-Capillary: 77 mg/dL (ref 70–99)
Glucose-Capillary: 116 mg/dL — ABNORMAL HIGH (ref 70–99)
Glucose-Capillary: 89 mg/dL (ref 70–99)
Glucose-Capillary: 106 mg/dL — ABNORMAL HIGH (ref 70–99)

## 2012-12-19 LAB — POCT I-STAT 4, (NA,K, GLUC, HGB,HCT)
Glucose, Bld: 112 mg/dL — ABNORMAL HIGH (ref 70–99)
HCT: 32 % — ABNORMAL LOW (ref 36.0–46.0)
Potassium: 4 mEq/L (ref 3.5–5.1)

## 2012-12-19 MED ORDER — OXYCODONE-ACETAMINOPHEN 5-325 MG PO TABS: 1.0000 | ORAL_TABLET | ORAL | Status: DC | PRN

## 2012-12-19 MED ORDER — GABAPENTIN 600 MG PO TABS: 300.0000 mg | ORAL_TABLET | Freq: Three times a day (TID) | ORAL | Status: DC

## 2012-12-19 MED ORDER — METHOCARBAMOL 500 MG PO TABS: 500.0000 mg | ORAL_TABLET | Freq: Four times a day (QID) | ORAL | Status: DC | PRN

## 2012-12-19 MED ORDER — TRAZODONE HCL 100 MG PO TABS: 100.0000 mg | ORAL_TABLET | Freq: Every day | ORAL | Status: DC

## 2012-12-19 MED ORDER — ALPRAZOLAM 0.5 MG PO TABS: 0.5000 mg | ORAL_TABLET | Freq: Every evening | ORAL | Status: DC | PRN

## 2012-12-19 NOTE — Progress Notes (Signed)
Subjective: Patient reports Still having pain with mobilization  Objective: Vital signs in last 24 hours: Temp:  [98.4 F (36.9 C)-99.1 F (37.3 C)] 99 F (37.2 C) (10/28 1303) Pulse Rate:  [88-97] 97 (10/28 1303) Resp:  [18-20] 18 (10/28 1303) BP: (103-128)/(57-80) 126/80 mmHg (10/28 1303) SpO2:  [92 %-95 %] 93 % (10/28 1303)  Intake/Output from previous day: 10/27 0701 - 10/28 0700 In: 360 [P.O.:360] Out: -  Intake/Output this shift:    Incision is clean and dry. Motor function is good in lower extremities.  Lab Results: No results found for this basename: WBC, HGB, HCT, PLT,  in the last 72 hours BMET No results found for this basename: NA, K, CL, CO2, GLUCOSE, BUN, CREATININE, CALCIUM,  in the last 72 hours  Studies/Results: No results found.  Assessment/Plan: Stable postop  LOS: 4 days  For transfer to [facility].   Valgene Deloatch J 12/19/2012, 3:40 PM

## 2012-12-19 NOTE — Discharge Summary (Signed)
Physician Discharge Summary  Patient ID: Katie Booker MRN: [id number] DOB/AGE: 03-12-1945 67 y.o.  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 She tolerated her surgery well.  Consults: None  Significant Diagnostic Studies: None  Treatments: surgery: Thoracic laminectomy T10-T11, laminectomy decompression L2-L5 with posterior interbody arthrodesis peek spacers pedicle screw fixation L2-L5.  Discharge Exam: Blood pressure 126/80, pulse 97, temperature 99 F (37.2 C), temperature source Oral, resp. rate 18, height 5\' 2"  (1.575 m), weight 84.823 kg (187 lb), SpO2 93.00%. Incisions are clean and dry. Motor function is intact. Station and gait are intact.  Disposition: [facility]  Discharge Orders   Future Orders Complete By Expires   Call MD for:  redness, tenderness, or signs of infection (pain, swelling, redness, odor or green/yellow discharge around incision site)  As directed    Call MD for:  severe uncontrolled pain  As directed    Call MD for:  temperature >100.4  As directed    Diet - low sodium heart healthy  As directed    Discharge instructions  As directed    Comments:     Okay to shower. Do not apply salves or appointments to incision. No heavy lifting with the upper extremities greater than 15 pounds. May resume driving  when not requiring pain medication and patient feels comfortable with doing so.   Increase activity slowly  As directed        Medication List         acetaminophen 650 MG CR tablet  Commonly known as:  TYLENOL  Take 650 mg by mouth every 8 (eight) hours as needed for pain.     ALPRAZolam 0.5 MG tablet  Commonly known as:  XANAX  Take 0.5 mg by mouth at bedtime as needed for sleep or anxiety.     atenolol 25 MG tablet  Commonly known as:  TENORMIN  Take 1 tablet (25 mg total) by mouth daily.     atorvastatin 40 MG tablet  Commonly known as:  LIPITOR  Take one tablet by mouth one time daily     beta carotene w/minerals tablet  Take 1 tablet by mouth daily.     citalopram 40 MG tablet  Commonly known as:  CELEXA  Take 40 mg by mouth daily.     Fish Oil 1200 MG Caps  Take 2 capsules by mouth 2 (two) times daily.     gemfibrozil 600 MG tablet  Commonly known as:  LOPID  Take 1 tablet (600 mg total) by mouth 2 (two) times daily before a meal.     glimepiride 2 MG tablet  Commonly known as:  AMARYL  Take 0.5 tablets (1 mg total) by mouth daily before breakfast.     glucose blood test strip  Commonly known as:  ONE TOUCH ULTRA TEST  1 each by Other route daily. Use as instructed     GNP CALCIUM 600 PLUS D/MINERAL 600-400 MG-UNIT Tabs  Take 1 tablet by mouth 2 (two) times daily.  HYDROcodone-acetaminophen 5-325 MG per tablet  Commonly known as:  NORCO/VICODIN  Take 1 tablet by mouth every 6 (six) hours as needed for pain.     levothyroxine 150 MCG tablet  Commonly known as:  SYNTHROID, LEVOTHROID  Take 1 tablet (150 mcg total) by mouth daily.     lisinopril 10 MG tablet  Commonly known as:  PRINIVIL,ZESTRIL  Take 1 tablet (10 mg total) by mouth daily.     loratadine 10 MG tablet  Commonly known as:  CLARITIN  Take 10 mg by mouth daily as needed for allergies.     metFORMIN 1000 MG tablet  Commonly known as:  GLUCOPHAGE  take 1 tablet by mouth twice daily  with meal     methocarbamol 500 MG tablet  Commonly known as:  ROBAXIN  Take 1 tablet (500 mg total) by mouth every 6 (six) hours as needed.     multivitamin tablet  Take 1 tablet by mouth daily.     omeprazole 20 MG capsule  Commonly known as:  PRILOSEC  Take 1 capsule (20 mg total) by mouth 2 (two) times daily.     onetouch ultrasoft lancets  Use daily     oxyCODONE-acetaminophen 5-325 MG per tablet  Commonly known as:  PERCOCET/ROXICET  Take 1-2 tablets by mouth every 3 (three) hours as needed for pain.     pregabalin 75 MG capsule  Commonly known as:  LYRICA  TAKE ONE CAPSULE BY MOUTH ONE TIME DAILY     PX IRON 200 (65 FE) MG Tabs  Generic drug:  Ferrous Sulfate Dried  Take 1 tablet by mouth 2 (two) times daily.     traZODone 100 MG tablet  Commonly known as:  DESYREL  Take 1 tablet (100 mg total) by mouth at bedtime.     zolpidem 10 MG tablet  Commonly known as:  AMBIEN  Take 10 mg by mouth at bedtime as needed for sleep.         SignedStefani Dama 12/19/2012, 3:45 PM

## 2012-12-19 NOTE — Progress Notes (Signed)
Physical Therapy Treatment Patient Details Name: Katie Booker MRN: [id number] DOB: October 09, 1945 Today's Date: 12/19/2012 Time: 1124-1206 PT Time Calculation (min): 42 min  PT Assessment / Plan / Recommendation  History of Present Illness     PT Comments   Pt limited today due to pain and anxiety.  Pt required min assist with functional bathroom tasks; pt demonstrated safety awareness and recalled all precautions.  Pt refused to sit in chair to eat lunch because of previous discomfort.    Follow Up Recommendations  SNF     Does the patient have the potential to tolerate intense rehabilitation     Barriers to Discharge        Equipment Recommendations  None recommended by PT    Recommendations for Other Services    Frequency Min 5X/week   Progress towards PT Goals Progress towards PT goals: Progressing toward goals  Plan Current plan remains appropriate    Precautions / Restrictions Precautions Precautions: None Precaution Booklet Issued: Yes (comment) Required Braces or Orthoses: Spinal Brace Spinal Brace: Lumbar corset;Applied in sitting position Restrictions Weight Bearing Restrictions: No   Pertinent Vitals/Pain 8/10 pain; decreased to 6/10 pain with ambulation. Pt positioned at end of tx session in bed with nursing present    Mobility  Bed Mobility Bed Mobility: Rolling Right;Right Sidelying to Sit;Sitting - Scoot to Edge of Bed Rolling Right: 6: Modified independent (Device/Increase time);With rail Sitting - Scoot to Edge of Bed: 6: Modified independent (Device/Increase time);With rail Transfers Transfers: Sit to Stand;Stand to Sit Sit to Stand: 4: Min guard;With upper extremity assist;From toilet;From bed;From chair/3-in-1;With armrests Stand to Sit: 4: Min guard;To chair/3-in-1;To bed;To toilet Ambulation/Gait Ambulation/Gait Assistance: 4: Min guard Ambulation Distance (Feet): 125 Feet Assistive device: 4-wheeled walker Ambulation/Gait Assistance Details:  Min guard for safety due to unsteadiness  Gait Pattern: Step-through pattern;Decreased stance time - left;Decreased weight shift to right;Left flexed knee in stance;Shuffle Gait velocity: decreased General Gait Details: Pt became SOB due to her anxiety; pt was educated and demonstrated purse lip breathing; pt required one seated rest break after 90'.   Stairs: No Wheelchair Mobility Wheelchair Mobility: No    Exercises     PT Diagnosis:    PT Problem List:   PT Treatment Interventions:     PT Goals (current goals can now be found in the care plan section)    Visit Information  Last PT Received On: 12/19/12 Assistance Needed: +1    Subjective Data      Cognition  Cognition Arousal/Alertness: Awake/alert Behavior During Therapy: Anxious Overall Cognitive Status: No family/caregiver present to determine baseline cognitive functioning Current Attention Level: Focused Awareness: Anticipatory General Comments: Pt was able to respond to all commands to, however was anxious and fearful    Balance     End of Session PT - End of Session Equipment Utilized During Treatment: Gait belt;Back brace Activity Tolerance: Patient limited by fatigue;Patient limited by pain;Patient limited by lethargy Patient left: in bed;with nursing/sitter in room;with call bell/phone within reach;with bed alarm set Nurse Communication: Mobility status   GP     Ernestina Columbia, SPTA 12/19/2012, 12:53 PM

## 2012-12-19 NOTE — Progress Notes (Signed)
Awaiting MD clearance for d/c to SNF bed at [facility]- will update facility    Reece Levy, MSW, [place] [phone]

## 2012-12-19 NOTE — Progress Notes (Addendum)
Occupational Therapy Treatment Patient Details Name: Katie Booker MRN: [id number] DOB: [birth year] Today's Date: 12/19/2012 Time: 4098-1191 OT Time Calculation (min): 29 min  OT Assessment / Plan / Recommendation  History of present illness 67 y.o. female admitted to [facility] on 12/15/12 Thoracic lami, L2-5 decompression fusion.     OT comments  Practiced with AE for LB ADLs and performed toileting. Pt limited due to pain and anxiety.    Follow Up Recommendations  SNF;Supervision/Assistance - 24 hour    Barriers to Discharge       Equipment Recommendations  Other (comment) (defer to snf)    Recommendations for Other Services    Frequency Min 2X/week   Progress towards OT Goals Progress towards OT goals: Progressing toward goals  Plan Discharge plan remains appropriate    Precautions / Restrictions Precautions Precautions: Back;Fall Precaution Booklet Issued: Yes (comment) Required Braces or Orthoses: Spinal Brace Spinal Brace: Lumbar corset;Applied in sitting position Restrictions Weight Bearing Restrictions: No   Pertinent Vitals/Pain Pain in back, not rated. Repositioned. O2 in 80's after ambulating back to bed towards end of session. Placed back on O2.    ADL  Upper Body Dressing: Moderate assistance-back brace Where Assessed - Upper Body Dressing: Unsupported sitting Lower Body Dressing: Minimal assistance Where Assessed - Lower Body Dressing: Supported sit to stand Toilet Transfer: Hydrographic surveyor Method: Sit to Barista: Comfort height toilet;Grab bars Toileting - Architect and Hygiene: +1 Total assistance Where Assessed - Toileting Clothing Manipulation and Hygiene: Sit to stand from 3-in-1 or toilet Equipment Used: Gait belt;Back brace;Reacher;Rolling walker;Sock aid Transfers/Ambulation Related to ADLs: Min guard  ADL Comments: Pt performed toileting tasks and required assistance for clothing/hygiene. OT asked if she  could move her gown and pt stated she could not, however she could pull up panties with Min A when practiced later in session. Pt practiced with AE donning underwear and donning/doffing socks-overall Min A. Educated on toilet aid for hygiene.     OT Diagnosis:    OT Problem List:   OT Treatment Interventions:     OT Goals(current goals can now be found in the care plan section) Acute Rehab OT Goals Patient Stated Goal: not stated OT Goal Formulation: With patient Time For Goal Achievement: 12/23/12 Potential to Achieve Goals: Good ADL Goals Pt Will Perform Grooming: standing;with set-up Pt Will Perform Lower Body Bathing: with set-up;with supervision;with adaptive equipment;sit to/from stand Pt Will Perform Lower Body Dressing: with set-up;with supervision;with adaptive equipment;sit to/from stand Pt Will Transfer to Toilet: ambulating;with supervision (3 in 1 over commode) Pt Will Perform Toileting - Clothing Manipulation and hygiene: with supervision;with set-up;with adaptive equipment;sit to/from stand Additional ADL Goal #1: Pt will be able to independently verbalize and demonstrate 3/3 back precautions.  Additional ADL Goal #2: Pt will be able to independently donn back brace independently while maintaining back precautions.   Visit Information  Last OT Received On: 12/19/12 Assistance Needed: +1 History of Present Illness: 67 y.o. female admitted to [facility] on 12/15/12 Thoracic lami, L2-5 decompression fusion.      Subjective Data      Prior Functioning       Cognition  Cognition Arousal/Alertness: Awake/alert Behavior During Therapy: Anxious Overall Cognitive Status: No family/caregiver present to determine baseline cognitive functioning Area of Impairment: Memory;Attention Current Attention Level: Sustained Memory: Decreased recall of precautions;Decreased short-term memory    Mobility  Bed Mobility Bed Mobility: Rolling Right;Right Sidelying to Sit Rolling Right: 5:  Supervision Right Sidelying to  Sit: 5: Supervision Details for Bed Mobility Assistance: cues for technique Transfers Transfers: Sit to Stand;Stand to Sit Sit to Stand: 4: Min guard;With upper extremity assist;From bed;From toilet;From chair/3-in-1 Stand to Sit: 4: Min guard;To bed;To chair/3-in-1;To toilet Details for Transfer Assistance: cues for technique and hand placement.    Exercises      Balance     End of Session OT - End of Session Equipment Utilized During Treatment: Gait belt;Rolling walker;Back brace;Oxygen Activity Tolerance: Patient limited by pain Patient left: in bed;with call bell/phone within reach;with bed alarm set Nurse Communication: Other (comment) (pt back in bed with bed alarm set)  GO     Earlie Raveling OTR/L [phone] 12/19/2012, 6:32 PM

## 2012-12-19 NOTE — Progress Notes (Signed)
Seen and agreed 12/19/2012 Angelly Spearing Elizabeth PTA [phone] pager [phone] office    

## 2012-12-20 ENCOUNTER — Other Ambulatory Visit: Payer: Self-pay

## 2012-12-20 ENCOUNTER — Non-Acute Institutional Stay (SKILLED_NURSING_FACILITY): Payer: Medicare Other | Admitting: Adult Health

## 2012-12-20 DIAGNOSIS — G4700 Insomnia, unspecified: Secondary | ICD-10-CM

## 2012-12-20 DIAGNOSIS — F3289 Other specified depressive episodes: Secondary | ICD-10-CM

## 2012-12-20 DIAGNOSIS — M48062 Spinal stenosis, lumbar region with neurogenic claudication: Secondary | ICD-10-CM

## 2012-12-20 DIAGNOSIS — F419 Anxiety disorder, unspecified: Secondary | ICD-10-CM

## 2012-12-20 DIAGNOSIS — E119 Type 2 diabetes mellitus without complications: Secondary | ICD-10-CM

## 2012-12-20 DIAGNOSIS — G8929 Other chronic pain: Secondary | ICD-10-CM

## 2012-12-20 DIAGNOSIS — D649 Anemia, unspecified: Secondary | ICD-10-CM

## 2012-12-20 DIAGNOSIS — K219 Gastro-esophageal reflux disease without esophagitis: Secondary | ICD-10-CM

## 2012-12-20 DIAGNOSIS — E785 Hyperlipidemia, unspecified: Secondary | ICD-10-CM

## 2012-12-20 DIAGNOSIS — I10 Essential (primary) hypertension: Secondary | ICD-10-CM

## 2012-12-20 DIAGNOSIS — F411 Generalized anxiety disorder: Secondary | ICD-10-CM

## 2012-12-20 DIAGNOSIS — E039 Hypothyroidism, unspecified: Secondary | ICD-10-CM

## 2012-12-20 DIAGNOSIS — M549 Dorsalgia, unspecified: Secondary | ICD-10-CM

## 2012-12-20 DIAGNOSIS — F329 Major depressive disorder, single episode, unspecified: Secondary | ICD-10-CM

## 2012-12-20 LAB — GLUCOSE, CAPILLARY: Glucose-Capillary: 122 mg/dL — ABNORMAL HIGH (ref 70–99)

## 2012-12-20 MED ORDER — ALPRAZOLAM 0.5 MG PO TABS: ORAL_TABLET | ORAL | Status: DC

## 2012-12-20 MED ORDER — ZOLPIDEM TARTRATE 5 MG PO TABS: 5.0000 mg | ORAL_TABLET | Freq: Every evening | ORAL | Status: DC | PRN

## 2012-12-20 MED ORDER — OXYCODONE-ACETAMINOPHEN 5-325 MG PO TABS: 1.0000 | ORAL_TABLET | ORAL | Status: DC | PRN

## 2012-12-20 MED ORDER — HYDROCODONE-ACETAMINOPHEN 5-325 MG PO TABS: ORAL_TABLET | ORAL | Status: DC

## 2012-12-20 MED ORDER — PREGABALIN 75 MG PO CAPS: ORAL_CAPSULE | ORAL | Status: DC

## 2012-12-20 NOTE — Progress Notes (Signed)
Patient for d/c today to SNF bed at [facility]. Patient agreeable to this plan- plan transfer via EMS. Asli Tokarski, MSW, LCSWA [phone]  

## 2012-12-20 NOTE — Progress Notes (Signed)
Patient much more tolerant of therapy this session Agreed with above note 12/20/2012 Fredrich Birks PTA [phone] pager [phone] office

## 2012-12-20 NOTE — Progress Notes (Signed)
Patient is discharge to [facility] via transport. Patient's belong sent with her daughter. Report given to nurse Selena Batten at [facility].

## 2012-12-20 NOTE — Progress Notes (Signed)
Clinical Social Work Department CLINICAL SOCIAL WORK PLACEMENT NOTE 12/20/2012  Patient:  Katie Booker, Katie Booker  Account Number:  [id number] Admit date:  12/15/2012  Clinical Social Worker:  Robin Searing  Date/time:  12/18/2012 02:08 PM  Clinical Social Work is seeking post-discharge placement for this patient at the following level of care:   SKILLED NURSING   (*CSW will update this form in Epic as items are completed)   12/18/2012  Patient/family provided with [facility] Department of Clinical Social Work's list of facilities offering this level of care within the geographic area requested by the patient (or if unable, by the patient's family).  12/18/2012  Patient/family informed of their freedom to choose among providers that offer the needed level of care, that participate in Medicare, Medicaid or managed care program needed by the patient, have an available bed and are willing to accept the patient.  12/18/2012  Patient/family informed of [facility]' ownership interest in [facility], as well as of the fact that they are under no obligation to receive care at this facility.  PASARR submitted to [employer]DS on 12/18/2012 PASARR number received from [employer] on 12/18/2012  FL2 transmitted to all facilities in geographic area requested by pt/family on  12/18/2012 FL2 transmitted to all facilities within larger geographic area on   Patient informed that his/her managed care company has contracts with or will negotiate with  certain facilities, including the following:     Patient/family informed of bed offers received:  12/18/2012 Patient chooses bed at [facility] Physician recommends and patient chooses bed at    Patient to be transferred to [facility] on  12/20/2012 Patient to be transferred to facility by [facility]  The following physician request were entered in Epic:   Additional Comments: Reece Levy, MSW, Theresia Majors [phone] C

## 2012-12-20 NOTE — Telephone Encounter (Signed)
Verified dose and instructions reflect manual request received by nursing home.   

## 2012-12-20 NOTE — Progress Notes (Signed)
Physical Therapy Treatment Patient Details Name: Katie Booker MRN: [id number] DOB: May 12, 1945 Today's Date: 12/20/2012 Time: 7829-5621 PT Time Calculation (min): 13 min  PT Assessment / Plan / Recommendation  History of Present Illness 67 y.o. female admitted to [facility] on 12/15/12 Thoracic lami, L2-5 decompression fusion.     PT Comments   Pt had a dramatic decrease in anxiety today, less fear of falling and more confident.  Tolerated tx well today and more motivated to ambulate and participate in PT. However tx was short because pt stated she was very hungry and ready to eat breakfast.  Follow Up Recommendations  SNF     Does the patient have the potential to tolerate intense rehabilitation     Barriers to Discharge        Equipment Recommendations  None recommended by PT    Recommendations for Other Services    Frequency Min 5X/week   Progress towards PT Goals Progress towards PT goals: Progressing toward goals  Plan Current plan remains appropriate    Precautions / Restrictions Precautions Precautions: Back;Fall Precaution Booklet Issued: Yes (comment) Precaution Comments: back sheet provided and reviewed.   Required Braces or Orthoses: Spinal Brace Spinal Brace: Lumbar corset;Applied in sitting position Restrictions Weight Bearing Restrictions: No   Pertinent Vitals/Pain 5/10 back pain and 6/10 hip pain. Pt stated she had recently been given pain meds; positioned in chair.     Mobility  Transfers Transfers: Sit to Stand;Stand to Sit Sit to Stand: 5: Supervision;With upper extremity assist;From bed Stand to Sit: 4: Min guard;To chair/3-in-1;With upper extremity assist Details for Transfer Assistance: VC's required when standing ->sitting for correct hand placement for safety Ambulation/Gait Ambulation/Gait Assistance: 5: Supervision Ambulation Distance (Feet): 125 Feet Assistive device: 4-wheeled walker Ambulation/Gait Assistance Details: Supervision for safety L  knee instability Gait Pattern: Step-through pattern;Decreased stride length;Left flexed knee in stance Gait velocity: decreased General Gait Details: Pt did well today and did not require a rest break; no SOB today and less anxiety Stairs: No Wheelchair Mobility Wheelchair Mobility: No    Exercises     PT Diagnosis:    PT Problem List:   PT Treatment Interventions:     PT Goals (current goals can now be found in the care plan section)    Visit Information  Last PT Received On: 12/20/12 Assistance Needed: +1 History of Present Illness: 67 y.o. female admitted to [facility] on 12/15/12 Thoracic lami, L2-5 decompression fusion.      Subjective Data      Cognition  Cognition Arousal/Alertness: Awake/alert Behavior During Therapy: WFL for tasks assessed/performed Overall Cognitive Status: Within Functional Limits for tasks assessed Current Attention Level: Focused General Comments: Pt was less anxious today and responded better to tx    Balance     End of Session PT - End of Session Equipment Utilized During Treatment: Gait belt Activity Tolerance: Patient tolerated treatment well Patient left: in chair;with call bell/phone within reach   GP     Vincie Linn, Irving Burton, SPTA 12/20/2012, 8:20 AM

## 2012-12-20 NOTE — Addendum Note (Signed)
Addended by: Maurice Small on: 12/20/2012 05:05 PM   Modules accepted: Orders

## 2012-12-21 DIAGNOSIS — G8929 Other chronic pain: Secondary | ICD-10-CM | POA: Insufficient documentation

## 2012-12-21 DIAGNOSIS — F419 Anxiety disorder, unspecified: Secondary | ICD-10-CM | POA: Insufficient documentation

## 2012-12-21 DIAGNOSIS — G4700 Insomnia, unspecified: Secondary | ICD-10-CM | POA: Insufficient documentation

## 2012-12-21 NOTE — Progress Notes (Signed)
Patient ID: NASIAH POLINSKY, female   DOB: 1945-05-11, 67 y.o.   MRN: [id number]       PROGRESS NOTE  DATE: 12/20/2012  FACILITY: Nursing Home Location: [facility]  LEVEL OF CARE: SNF (31)  Acute Visit  CHIEF COMPLAINT:  Follow-up hospitalization  HISTORY OF PRESENT ILLNESS: This is a 67 year old female who has been admitted to [facility] today, 12/20/12, from [facility] with Spinal stenosis, lumbar region with Neurogenic claudication S/P Thoracic laminectomy and decompression. She has been admitted for a short-term rehabilitation.   REASSESSMENT OF ONGOING PROBLEM(S):  HTN: Pt 's HTN remains stable.  Denies CP, sob, DOE, pedal edema, headaches, dizziness or visual disturbances.  No complications from the medications currently being used.  Last BP :  123/67  DEPRESSION: The depression remains stable. Patient denies ongoing feelings of sadness, insomnia, anedhonia or lack of appetite. No complications reported from the medications currently being used. Staff do not report behavioral problems.  DM:pt's DM remains stable.  Pt denies polyuria, polydipsia, polyphagia, changes in vision or hypoglycemic episodes.  No complications noted from the medication presently being used. 6/14 hemoglobin A1c6.6  HYPOTHYROIDISM: The hypothyroidism remains stable. No complications noted from the medications presently being used.  The patient denies fatigue or constipation.  6/14 TSH 1.66  PAST MEDICAL HISTORY : Reviewed.  No changes.  CURRENT MEDICATIONS: Reviewed per [facility]  REVIEW OF SYSTEMS:  GENERAL: no change in appetite, no fatigue, no weight changes, no fever, chills or weakness RESPIRATORY: no cough, SOB, DOE, wheezing, hemoptysis CARDIAC: no chest pain, edema or palpitations GI: no abdominal pain, diarrhea, constipation, heart burn, nausea or vomiting  PHYSICAL EXAMINATION  VS:  T96.6       P81      RR20      BP123/67        WU981 (Lb)  GENERAL: no acute  distress, normal body habitus EYES: conjunctivae normal, sclerae normal, normal eye lids NECK: supple, trachea midline, no neck masses, no thyroid tenderness, no thyromegaly LYMPHATICS: no LAN in the neck, no supraclavicular LAN RESPIRATORY: breathing is even & unlabored, BS CTAB CARDIAC: RRR, no murmur,no extra heart sounds, no edema GI: abdomen soft, normal BS, no masses, no tenderness, no hepatomegaly, no splenomegaly PSYCHIATRIC: the patient is alert & oriented to person, affect & behavior appropriate  LABS/RADIOLOGY: 12/16/12 WBC 8.5 hemoglobin 8.7 hematocrit 26.3 12/01/12 sodium 136 potassium 4.2 glucose 116 BUN 15 creatinine 0.68 calcium 9.8 08/04/12 TSH 1.66 hemoglobin A1c 6.6   ASSESSMENT/PLAN:  Spinal stenosis, lumbar region with neurogenic claudication status post thoracic laminectomy decompression - for rehabilitation  Anxiety - patient is anxious and daughter reports patient taking Xanax o.5 mg PO BID at home - ordered  Hypertension - well controlled; continue lisinopril  Hypothyroidism - continue levothyroxine  Chronic back pain - continue Lyrica  GERD - continue Prilosec  Hyperlipidemia - continue Lipitor and Lopid  Insomnia - continue trazodone and discontinue Ambien  Diabetes mellitus, type II - continue Amaryl and metformin  Anemia - continue iron supplementation     CPT CODE: 19147

## 2012-12-27 ENCOUNTER — Non-Acute Institutional Stay (SKILLED_NURSING_FACILITY): Payer: Medicare Other | Admitting: Internal Medicine

## 2012-12-27 DIAGNOSIS — M4714 Other spondylosis with myelopathy, thoracic region: Secondary | ICD-10-CM

## 2012-12-27 DIAGNOSIS — E119 Type 2 diabetes mellitus without complications: Secondary | ICD-10-CM

## 2012-12-27 DIAGNOSIS — I10 Essential (primary) hypertension: Secondary | ICD-10-CM

## 2012-12-27 DIAGNOSIS — E039 Hypothyroidism, unspecified: Secondary | ICD-10-CM

## 2013-01-04 ENCOUNTER — Non-Acute Institutional Stay (SKILLED_NURSING_FACILITY): Payer: Medicare Other | Admitting: Adult Health

## 2013-01-04 DIAGNOSIS — F419 Anxiety disorder, unspecified: Secondary | ICD-10-CM

## 2013-01-04 DIAGNOSIS — M549 Dorsalgia, unspecified: Secondary | ICD-10-CM

## 2013-01-04 DIAGNOSIS — M48062 Spinal stenosis, lumbar region with neurogenic claudication: Secondary | ICD-10-CM

## 2013-01-04 DIAGNOSIS — F329 Major depressive disorder, single episode, unspecified: Secondary | ICD-10-CM

## 2013-01-04 DIAGNOSIS — D649 Anemia, unspecified: Secondary | ICD-10-CM

## 2013-01-04 DIAGNOSIS — E119 Type 2 diabetes mellitus without complications: Secondary | ICD-10-CM

## 2013-01-04 DIAGNOSIS — G8929 Other chronic pain: Secondary | ICD-10-CM

## 2013-01-04 DIAGNOSIS — G4700 Insomnia, unspecified: Secondary | ICD-10-CM

## 2013-01-04 DIAGNOSIS — I10 Essential (primary) hypertension: Secondary | ICD-10-CM

## 2013-01-04 DIAGNOSIS — E785 Hyperlipidemia, unspecified: Secondary | ICD-10-CM

## 2013-01-04 DIAGNOSIS — F411 Generalized anxiety disorder: Secondary | ICD-10-CM

## 2013-01-04 DIAGNOSIS — E039 Hypothyroidism, unspecified: Secondary | ICD-10-CM

## 2013-01-04 DIAGNOSIS — K219 Gastro-esophageal reflux disease without esophagitis: Secondary | ICD-10-CM

## 2013-01-04 NOTE — Progress Notes (Signed)
Patient ID: Katie Booker, female   DOB: 03-18-1945, 67 y.o.   MRN: [id number]       PROGRESS NOTE  DATE: 01/04/2013   FACILITY: [facility]  LEVEL OF CARE: SNF (31)  Acute Visit  CHIEF COMPLAINT:  Discharge Notes   HISTORY OF PRESENT ILLNESS: This is a 67 year old female who is for discharge home with ome health PT, OT and Nursing. She has been admitted to [facility] on 12/20/12 from [facility] with Spinal stenosis, lumbar region with Neurogenic claudication S/P Thoracic laminectomy and decompression. Patient was admitted to this facility for short-term rehabilitation after the patient's recent hospitalization.  Patient has completed SNF rehabilitation and therapy has cleared the patient for discharge.  Reassessment of ongoing problem(s):  HTN: Pt 's HTN remains stable.  Denies CP, sob, DOE, pedal edema, headaches, dizziness or visual disturbances.  No complications from the medications currently being used.  Last BP : 119/59  HYPOTHYROIDISM: The hypothyroidism remains stable. No complications noted from the medications presently being used.  The patient denies fatigue or constipation.  10/14 TSH 4.7260  HYPERLIPIDEMIA: No complications from the medications presently being used. 10/14 fasting lipid panel showed : cholesterol 147  HDL 38  LDL 72   PAST MEDICAL HISTORY : Reviewed.  No changes.  CURRENT MEDICATIONS: Reviewed per [facility]  REVIEW OF SYSTEMS:  GENERAL: no change in appetite, no fatigue, no weight changes, no fever, chills or weakness RESPIRATORY: no cough, SOB, DOE, wheezing, hemoptysis CARDIAC: no chest pain, edema or palpitations GI: no abdominal pain, diarrhea, constipation, heart burn, nausea or vomiting  PHYSICAL EXAMINATION  VS:  T97.4       P82       RR20      BP119/59      POX98 %       WT180.8 (Lb)  GENERAL: no acute distress, normal body habitus NECK: supple, trachea midline, no neck masses, no thyroid tenderness, no  thyromegaly LYMPHATICS: no LAN in the neck, no supraclavicular LAN RESPIRATORY: breathing is even & unlabored, BS CTAB CARDIAC: RRR, no murmur,no extra heart sounds, no edema GI: abdomen soft, normal BS, no masses, no tenderness, no hepatomegaly, no splenomegaly PSYCHIATRIC: the patient is alert & oriented to person, affect & behavior appropriate  LABS/RADIOLOGY: 12/21/12  tsh 4.7260, NA 139  K 3.7  Glucose 104  BUN 12  Creatinine 0.6 CA 9.1  Cholesterol 147  HDL 38  LDL 72  Trig 184  Wbc 10.1  hgb 9.0  hct 28.6 12/16/12 WBC 8.5 hemoglobin 8.7 hematocrit 26.3 12/01/12 sodium 136 potassium 4.2 glucose 116 BUN 15 creatinine 0.68 calcium 9.8 08/04/12 TSH 1.66 hemoglobin A1c 6.6  ASSESSMENT/PLAN:  Spinal stenosis, lumbar region with neurogenic claudication status post thoracic laminectomy decompression - for Home health PT, OT and Nursing  Anxiety - stable; continue Xanax  Hypertension - well controlled; continue lisinopril  Hypothyroidism - continue levothyroxine  Chronic back pain - continue Lyrica  GERD - continue Prilosec  Hyperlipidemia - continue Lipitor  Insomnia - continue trazodone  Diabetes mellitus, type II - continue Amaryl and metformin  Anemia - continue iron supplementation    I have filled out patient's discharge paperwork and written prescriptions.  Patient will receive home health PT, OT and Nursing.   Total discharge time: Less than 30 minutes Discharge time involved coordination of the discharge process with Child psychotherapist, nursing staff and therapy department. Medical justification for home health services verified.  CPT CODE: 30865

## 2013-01-13 ENCOUNTER — Other Ambulatory Visit: Payer: Self-pay | Admitting: Internal Medicine

## 2013-01-25 ENCOUNTER — Other Ambulatory Visit: Payer: Self-pay | Admitting: Internal Medicine

## 2013-01-27 ENCOUNTER — Other Ambulatory Visit: Payer: Self-pay | Admitting: Internal Medicine

## 2013-01-31 NOTE — Progress Notes (Signed)
Patient ID: Katie Booker, female   DOB: [birth year], 67 y.o.   MRN: [id number]        HISTORY & PHYSICAL  DATE: 12/27/2012     FACILITY: [facility]  LEVEL OF CARE: SNF (31)  ALLERGIES:  Allergies  Allergen Reactions  . Hydromorphone Hcl     Makes crazy    CHIEF COMPLAINT:  Manage thoracic spondylosis with myelopathy, hypothyroidism, and diabetes mellitus.     HISTORY OF PRESENT ILLNESS:  The patient is a 67 year-old, Caucasian female.    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  The patient was having neurogenic claudication and lumbar radiculopathy.  Therefore, she underwent thoracic laminectomy at T10-11, laminectomy compression at L2-5 with posterior interbody arthrodesis with spacers, pedicle screw fixation at L2-5.  She tolerated the procedure well and is admitted to this facility for short-term rehabilitation.  She denies current back pain.    HYPOTHYROIDISM: The hypothyroidism remains stable. No complications noted from the medications presently being used.  The patient denies fatigue or constipation.  Last TSH:  Not available.    DM:pt's DM remains stable.  Pt denies polyuria, polydipsia, polyphagia, changes in vision or hypoglycemic episodes.  No complications noted from the medication presently being used.  Last hemoglobin A1c is:  Not available.    PAST MEDICAL HISTORY :  Past Medical History  Diagnosis Date  . HYPERLIPIDEMIA 09/18/2009    takes Lipitor daily  . HYPOTHYROIDISM 09/18/2009  . OSTEOARTHRITIS 09/18/2009  . Obesity   . Hemorrhoids   . Anal fissure   . Anxiety     takes Xanax prn sleep and anxiety  . DEPRESSION 09/18/2009    takes Celexa daily  . HYPERTENSION 09/18/2009    takes Atenolol daily and Lisinopril  . Histoplasmosis     hx of  . Shortness of breath     with exertion  . OSA (obstructive sleep apnea)     has a CPAP and sleep study report in epic from 2009  . History of bronchitis     last time around 1990   . Headache(784.0)     sinus HA  . Peripheral neuropathy     takes Lyrica daily  . Joint pain   . Joint swelling   . Chronic back pain     scoliosis/stenosis/spondylosis  . Skin spots, red     right above both elbows;pt states always there  . GERD 09/18/2009    takes Omeprazole daily  . Umbilical hernia   . ANEMIA-NOS 09/18/2009    takes Ferrous Sulfate daily  . History of colon polyps   . Urinary frequency   . Urinary urgency   . Insomnia     takes trazodone and ambien nightly  . Cancer     basal cell left cheek  . DIABETES MELLITUS, TYPE II 09/18/2009    takes Amaryl daily and Metformin    PAST SURGICAL HISTORY: Past Surgical History  Procedure Laterality Date  . Tubal fulgaration  1991  . Colonoscopy  2007  . Esophagogastroduodenoscopy  2007  . Hemorrhoid surgery  03/23/2011    Procedure: HEMORRHOIDECTOMY;  Surgeon: Clovis Pu. Cornett, MD;  Location: [facility];  Service: General;  Laterality: N/A;  lateral internal sphincterotomy and hemorrhoidectomy  . Sphincterotomy    . Thoracic discectomy N/A 12/15/2012    Procedure: Thoracic ten-eleven Thoracic laminectomy;  Surgeon: Barnett Abu, MD;  Location: [facility];  Service: Neurosurgery;  Laterality: N/A;  Thoracic  ten-eleven Thoracic laminectomy    SOCIAL HISTORY:  reports that she quit smoking about 8 years ago. Her smoking use included Cigarettes. She has a 20 pack-year smoking history. She does not have any smokeless tobacco history on file. She reports that she drinks alcohol. She reports that she does not use illicit drugs.  FAMILY HISTORY:  Family History  Problem Relation Age of Onset  . Diabetes Father   . COPD Father   . Cancer Maternal Grandfather     stomach    CURRENT MEDICATIONS: Reviewed per [facility]  REVIEW OF SYSTEMS:  See HPI otherwise 14 point ROS is negative.  PHYSICAL EXAMINATION  VS:  T 98.4       P 78      RR 21      BP 115/60      POX 95%        WT (Lb)  GENERAL: no acute  distress, moderately obese body habitus EYES: conjunctivae normal, sclerae normal, normal eye lids MOUTH/THROAT: lips without lesions,no lesions in the mouth,tongue is without lesions,uvula elevates in midline NECK: supple, trachea midline, no neck masses, no thyroid tenderness, no thyromegaly LYMPHATICS: no LAN in the neck, no supraclavicular LAN RESPIRATORY: breathing is even & unlabored, BS CTAB CARDIAC: RRR, no murmur,no extra heart sounds, no edema GI:  ABDOMEN: has abdominal and back brace; therefore, unable to perform an exam     MUSCULOSKELETAL: HEAD: normal to inspection & palpation BACK: no kyphosis, scoliosis or spinal processes tenderness EXTREMITIES: LEFT UPPER EXTREMITY: full range of motion, normal strength & tone RIGHT UPPER EXTREMITY:  full range of motion, normal strength & tone LEFT LOWER EXTREMITY: strength intact, range of motion moderate   RIGHT LOWER EXTREMITY: strength intact, range of motion moderate   PSYCHIATRIC: the patient is alert & oriented to person, affect & behavior appropriate  LABS/RADIOLOGY: Hemoglobin 8.7, otherwise CBC normal.    Labs reviewed: Basic Metabolic Panel:  Recent Labs  16/10/96 1136 12/01/12 1506 12/15/12 1051  NA 137 136 138  K 5.2* 4.2 4.0  CL 102 97  --   CO2 25 25  --   GLUCOSE 75 116* 112*  BUN 23 15  --   CREATININE 1.0 0.68  --   CALCIUM 9.8 9.8  --    Liver Function Tests:  Recent Labs  08/04/12 1136  AST 20  ALT 23  ALKPHOS 57  BILITOT 0.4  PROT 6.6  ALBUMIN 3.8   CBC:  Recent Labs  08/04/12 1136 08/11/12 1559 10/26/12 0856 12/01/12 1506 12/15/12 1051 12/16/12 0615  WBC 6.5 9.0 7.7 8.5  --  8.5  NEUTROABS 3.5 6.2 4.3  --   --   --   HGB 9.6* 10.3* 12.4 12.8 10.9* 8.7*  HCT 30.6* 31.2* 37.6 38.7 32.0* 26.3*  MCV 68.0 Repeated and verified X2.* 68.4 Repeated and verified X2.* 76.9* 78.0  --  80.2  PLT 324.0 253.0 245.0 229  --  151    Lipid Panel:  Recent Labs  04/11/12 0906  HDL 47.70    Cardiac Enzymes:  Recent Labs  08/04/12 1136  CKTOTAL 202*    CBG:  Recent Labs  12/19/12 1631 12/19/12 2132 12/20/12 0630  GLUCAP 103* 106* 122*     ASSESSMENT/PLAN:  Thoracic spondylosis with myelopathy.  Status post surgery.  Continue rehabilitation.    Hypothyroidism.  Continue levothyroxine.    Diabetes mellitus.  Continue current medications.    Hypertension.  Well controlled.    GERD.  Well  controlled.     Acute blood loss anemia.  Reassess hemoglobin level.  Continue iron.    Check CBC and BMP.    I have reviewed patient's medical records received at admission/from hospitalization.  CPT CODE: 40981

## 2013-02-08 ENCOUNTER — Telehealth: Payer: Self-pay | Admitting: Internal Medicine

## 2013-02-08 NOTE — Telephone Encounter (Signed)
Pt need new rx hydrocodone °

## 2013-02-09 MED ORDER — HYDROCODONE-ACETAMINOPHEN 5-325 MG PO TABS: ORAL_TABLET | ORAL | Status: DC

## 2013-02-09 NOTE — Telephone Encounter (Signed)
Spoke to pt told her Rx ready for pickup, will be at the front desk. Pt verbalized understanding. Rx printed and signed, put at front desk.

## 2013-03-19 ENCOUNTER — Telehealth: Payer: Self-pay | Admitting: Internal Medicine

## 2013-03-19 MED ORDER — HYDROCODONE-ACETAMINOPHEN 5-325 MG PO TABS: ORAL_TABLET | ORAL | Status: DC

## 2013-03-19 NOTE — Telephone Encounter (Signed)
Left detailed message Rx ready for pick up will be at the front desk. Rx printed and signed, put at front desk. 

## 2013-03-19 NOTE — Telephone Encounter (Signed)
Pt requesting refill of HYDROcodone-acetaminophen (NORCO/VICODIN) 5-325 MG per tablet 

## 2013-03-25 ENCOUNTER — Other Ambulatory Visit: Payer: Self-pay | Admitting: Internal Medicine

## 2013-03-27 ENCOUNTER — Ambulatory Visit (INDEPENDENT_AMBULATORY_CARE_PROVIDER_SITE_OTHER)
Admission: RE | Admit: 2013-03-27 | Discharge: 2013-03-27 | Disposition: A | Discharge status: Home / Self Care | Payer: Medicare Other | Source: Ambulatory Visit | Attending: Family | Admitting: Family

## 2013-03-27 ENCOUNTER — Encounter: Payer: Self-pay | Admitting: Family

## 2013-03-27 ENCOUNTER — Ambulatory Visit (INDEPENDENT_AMBULATORY_CARE_PROVIDER_SITE_OTHER): Payer: Medicare Other | Admitting: Family

## 2013-03-27 ENCOUNTER — Other Ambulatory Visit: Payer: Self-pay | Admitting: Family

## 2013-03-27 VITALS — BP 140/80 | HR 139 | Temp 99.4°F | Wt 191.0 lb

## 2013-03-27 DIAGNOSIS — R195 Other fecal abnormalities: Secondary | ICD-10-CM

## 2013-03-27 DIAGNOSIS — R1084 Generalized abdominal pain: Secondary | ICD-10-CM

## 2013-03-27 DIAGNOSIS — R112 Nausea with vomiting, unspecified: Secondary | ICD-10-CM

## 2013-03-27 LAB — COMPREHENSIVE METABOLIC PANEL WITH GFR
ALT: 24 U/L (ref 0–35)
AST: 21 U/L (ref 0–37)
Albumin: 4.1 g/dL (ref 3.5–5.2)
Alkaline Phosphatase: 68 U/L (ref 39–117)
BUN: 17 mg/dL (ref 6–23)
CO2: 25 meq/L (ref 19–32)
Calcium: 9.7 mg/dL (ref 8.4–10.5)
Chloride: 100 meq/L (ref 96–112)
Creatinine, Ser: 0.7 mg/dL (ref 0.4–1.2)
GFR: 90.18 mL/min
Glucose, Bld: 134 mg/dL — ABNORMAL HIGH (ref 70–99)
Potassium: 4.3 meq/L (ref 3.5–5.1)
Sodium: 136 meq/L (ref 135–145)
Total Bilirubin: 0.4 mg/dL (ref 0.3–1.2)
Total Protein: 7.6 g/dL (ref 6.0–8.3)

## 2013-03-27 LAB — CBC WITH DIFFERENTIAL/PLATELET
BASOS PCT: 0.1 % (ref 0.0–3.0)
Basophils Absolute: 0 10*3/uL (ref 0.0–0.1)
EOS PCT: 0.6 % (ref 0.0–5.0)
Eosinophils Absolute: 0.1 10*3/uL (ref 0.0–0.7)
HCT: 40.1 % (ref 36.0–46.0)
HEMOGLOBIN: 12.7 g/dL (ref 12.0–15.0)
Lymphs Abs: 0.3 10*3/uL — ABNORMAL LOW (ref 0.7–4.0)
MCHC: 31.7 g/dL (ref 30.0–36.0)
MCV: 77.3 fl — AB (ref 78.0–100.0)
Monocytes Absolute: 0.4 10*3/uL (ref 0.1–1.0)
Monocytes Relative: 3.5 % (ref 3.0–12.0)
Neutro Abs: 10.4 10*3/uL — ABNORMAL HIGH (ref 1.4–7.7)
Platelets: 236 10*3/uL (ref 150.0–400.0)
RBC: 5.19 Mil/uL — AB (ref 3.87–5.11)
RDW: 15.7 % — ABNORMAL HIGH (ref 11.5–14.6)
WBC: 11.2 10*3/uL — ABNORMAL HIGH (ref 4.5–10.5)

## 2013-03-27 MED ORDER — ONDANSETRON HCL 8 MG PO TABS: 8.0000 mg | ORAL_TABLET | Freq: Three times a day (TID) | ORAL | Status: DC | PRN

## 2013-03-27 NOTE — Progress Notes (Signed)
Pre visit review using our clinic review tool, if applicable. No additional management support is needed unless otherwise documented below in the visit note. 

## 2013-03-27 NOTE — Progress Notes (Signed)
Subjective:    Patient ID: Katie Booker, female    DOB: 1945/09/04, 68 y.o.   MRN: [id number]  HPI  68 year old female caucasian, nonsmoker presenting today for generalized abdominal pain.  Pain began about 1 am this morning.  She was awake with abdominal pain that was tender to touch on both sides and under the umbilicus.  She took Norco and Ribaxin and went back to bed.  She was woken up again with pain at 5am.  At that time, she took omeprazole and another pain pill.  She vomited after 10 minutes.  She could see that the pills were undigested.  She had a large bowel movement and vomited about five times. She has since had loose stools.  She called the office because she could not keep anything down and had no relief.  She has taken omeprazole, Noro and Robaxin in the last hour and has been able to keep them down. Denies any history of diverticulitis but has a history of colon polys. Last colonoscopy, 1998. Is aware she is overdue.    Review of Systems  Constitutional: Positive for appetite change.  HENT: Negative.   Respiratory: Negative.   Cardiovascular: Negative.   Gastrointestinal: Positive for nausea, vomiting, abdominal pain and abdominal distention.       Pain at 5, on scale of 1-10.  Last bowel movement 03/27/13.  Endocrine: Negative.   Genitourinary: Negative.   Musculoskeletal: Positive for back pain.       Chronic back pain  Skin: Negative.   Neurological: Negative.   Hematological: Negative.   Psychiatric/Behavioral: Negative.    Past Medical History  Diagnosis Date  . HYPERLIPIDEMIA 09/18/2009    takes Lipitor daily  . HYPOTHYROIDISM 09/18/2009  . OSTEOARTHRITIS 09/18/2009  . Obesity   . Hemorrhoids   . Anal fissure   . Anxiety     takes Xanax prn sleep and anxiety  . DEPRESSION 09/18/2009    takes Celexa daily  . HYPERTENSION 09/18/2009    takes Atenolol daily and Lisinopril  . Histoplasmosis     hx of  . Shortness of breath     with exertion  . OSA  (obstructive sleep apnea)     has a CPAP and sleep study report in epic from 2009  . History of bronchitis     last time around 1990  . Headache(784.0)     sinus HA  . Peripheral neuropathy     takes Lyrica daily  . Joint pain   . Joint swelling   . Chronic back pain     scoliosis/stenosis/spondylosis  . Skin spots, red     right above both elbows;pt states always there  . GERD 09/18/2009    takes Omeprazole daily  . Umbilical hernia   . ANEMIA-NOS 09/18/2009    takes Ferrous Sulfate daily  . History of colon polyps   . Urinary frequency   . Urinary urgency   . Insomnia     takes trazodone and ambien nightly  . Cancer     basal cell left cheek  . DIABETES MELLITUS, TYPE II 09/18/2009    takes Amaryl daily and Metformin    History   Social History  . Marital Status: Divorced    Spouse Name: N/A    Number of Children: N/A  . Years of Education: N/A   Occupational History  . retired    Social History Main Topics  . Smoking status: Former Smoker -- 1.00 packs/day for 20  years    Types: Cigarettes    Quit date: 02/23/2004  . Smokeless tobacco: Not on file     Comment: quit 2007  . Alcohol Use: Yes     Comment: rare  . Drug Use: No  . Sexual Activity: No   Other Topics Concern  . Not on file   Social History Narrative  . No narrative on file    Past Surgical History  Procedure Laterality Date  . Tubal fulgaration  1991  . Colonoscopy  2007  . Esophagogastroduodenoscopy  2007  . Hemorrhoid surgery  03/23/2011    Procedure: HEMORRHOIDECTOMY;  Surgeon: Clovis Pu. Cornett, MD;  Location: [facility];  Service: General;  Laterality: N/A;  lateral internal sphincterotomy and hemorrhoidectomy  . Sphincterotomy    . Thoracic discectomy N/A 12/15/2012    Procedure: Thoracic ten-eleven Thoracic laminectomy;  Surgeon: Barnett Abu, MD;  Location: [facility];  Service: Neurosurgery;  Laterality: N/A;  Thoracic ten-eleven Thoracic laminectomy    Family  History  Problem Relation Age of Onset  . Diabetes Father   . COPD Father   . Cancer Maternal Grandfather     stomach    Allergies  Allergen Reactions  . Hydromorphone Hcl     Makes crazy    Current Outpatient Prescriptions on File Prior to Visit  Medication Sig Dispense Refill  . acetaminophen (TYLENOL) 650 MG CR tablet Take 650 mg by mouth every 8 (eight) hours as needed for pain.      Marland Kitchen ALPRAZolam (XANAX) 0.5 MG tablet Take 0.5 mg by mouth at bedtime as needed for sleep or anxiety.      . ALPRAZolam (XANAX) 0.5 MG tablet 1 by mouth every night at bedtime for sleep and anxiety  30 tablet  5  . atenolol (TENORMIN) 25 MG tablet Take 1 tablet (25 mg total) by mouth daily.  90 tablet  3  . atorvastatin (LIPITOR) 40 MG tablet Take one tablet by mouth one time daily  90 tablet  3  . beta carotene w/minerals (OCUVITE) tablet Take 1 tablet by mouth daily.      . Calcium Carbonate-Vit D-Min (GNP CALCIUM 600 PLUS D/MINERAL) 600-400 MG-UNIT TABS Take 1 tablet by mouth 2 (two) times daily.        . citalopram (CELEXA) 40 MG tablet Take 40 mg by mouth daily.      . Ferrous Sulfate Dried (PX IRON) 200 (65 FE) MG TABS Take 1 tablet by mouth 2 (two) times daily.      Marland Kitchen gemfibrozil (LOPID) 600 MG tablet Take one tablet by mouth twice daily before meals  180 tablet  3  . glimepiride (AMARYL) 2 MG tablet Take 0.5 tablets (1 mg total) by mouth daily before breakfast.  30 tablet  3  . glucose blood (ONE TOUCH ULTRA TEST) test strip 1 each by Other route daily. Use as instructed  100 each  6  . HYDROcodone-acetaminophen (NORCO/VICODIN) 5-325 MG per tablet 1 by mouth every 6 hours as needed for pain **DO NOT EXCEED 4 GM OF TYLENOL IN 24 HOURS**  120 tablet  0  . Lancets (ONETOUCH ULTRASOFT) lancets Use daily  100 each  6  . levothyroxine (SYNTHROID, LEVOTHROID) 150 MCG tablet Take one tablet by mouth one time daily  90 tablet  1  . lisinopril (PRINIVIL,ZESTRIL) 10 MG tablet TAKE ONE TABLET BY MOUTH ONE  TIME DAILY   90 tablet  1  . loratadine (CLARITIN) 10 MG tablet Take 10  mg by mouth daily as needed for allergies.      . metFORMIN (GLUCOPHAGE) 1000 MG tablet take 1 tablet by mouth twice daily with meal  180 tablet  1  . methocarbamol (ROBAXIN) 500 MG tablet Take 1 tablet (500 mg total) by mouth every 6 (six) hours as needed.  60 tablet  3  . Multiple Vitamin (MULTIVITAMIN) tablet Take 1 tablet by mouth daily.        . Omega-3 Fatty Acids (FISH OIL) 1200 MG CAPS Take 2 capsules by mouth 2 (two) times daily.      Marland Kitchen omeprazole (PRILOSEC) 20 MG capsule Take 1 capsule (20 mg total) by mouth 2 (two) times daily.  180 capsule  4  . pregabalin (LYRICA) 75 MG capsule TAKE ONE CAPSULE BY MOUTH ONE TIME DAILY  90 capsule  1  . traZODone (DESYREL) 100 MG tablet Take 1 tablet (100 mg total) by mouth at bedtime.  90 tablet  6  . traZODone (DESYREL) 100 MG tablet Take 1 tablet (100 mg total) by mouth at bedtime.  30 tablet  0  . oxyCODONE-acetaminophen (PERCOCET/ROXICET) 5-325 MG per tablet Take 1-2 tablets by mouth every 3 (three) hours as needed for pain. 1 by mouth three times daily as needed for pain **DO NOT EXCEED 4 GM OF TYLENOL IN 24 HOURS**  90 tablet  0  . zolpidem (AMBIEN) 5 MG tablet Take 1 tablet (5 mg total) by mouth at bedtime as needed for sleep.  30 tablet  5   No current facility-administered medications on file prior to visit.    BP 140/80  Pulse 139  Temp(Src) 99.4 F (37.4 C) (Oral)  Wt 191 lb (86.637 kg)chart     Objective:   Physical Exam  Constitutional: She is oriented to person, place, and time. She appears well-developed and well-nourished.  HENT:  Right Ear: External ear normal.  Left Ear: External ear normal.  Nose: Nose normal.  Mouth/Throat: Oropharynx is clear and moist.  Neck: Normal range of motion. Neck supple.  Cardiovascular: Normal rate, regular rhythm and normal heart sounds.   Pulmonary/Chest: Effort normal and breath sounds normal.  Abdominal: Soft.  Bowel sounds are normal. She exhibits no mass. There is tenderness. There is no rebound and no guarding.  Umbilical hernia noted but reducible and without pain.   Musculoskeletal: Normal range of motion.  Patient has history chronic pain  Neurological: She is alert and oriented to person, place, and time.  Skin: Skin is warm and dry.  Psychiatric: She has a normal mood and affect.          Assessment & Plan:  Assessment 1. Abdominal Pain- Generalized 2. Chronic Back Pain 3. Anxiety  Plan 1. Continue current medications. 2. Zofran 8 mg PO/PRN for nausea.  3.  KUB at [facility] office. 4. Obtain labs that include CMP, CBC, and UA.  5.  Call office with any problems or concerns.

## 2013-03-27 NOTE — Patient Instructions (Addendum)
[address]   Abdominal Pain, Adult Many things can cause abdominal pain. Usually, abdominal pain is not caused by a disease and will improve without treatment. It can often be observed and treated at home. Your health care provider will do a physical exam and possibly order blood tests and X-rays to help determine the seriousness of your pain. However, in many cases, more time must pass before a clear cause of the pain can be found. Before that point, your health care provider may not know if you need more testing or further treatment. HOME CARE INSTRUCTIONS  Monitor your abdominal pain for any changes. The following actions may help to alleviate any discomfort you are experiencing:  Only take over-the-counter or prescription medicines as directed by your health care provider.  Do not take laxatives unless directed to do so by your health care provider.  Try a clear liquid diet (broth, tea, or water) as directed by your health care provider. Slowly move to a bland diet as tolerated. SEEK MEDICAL CARE IF:  You have unexplained abdominal pain.  You have abdominal pain associated with nausea or diarrhea.  You have pain when you urinate or have a bowel movement.  You experience abdominal pain that wakes you in the night.  You have abdominal pain that is worsened or improved by eating food.  You have abdominal pain that is worsened with eating fatty foods. SEEK IMMEDIATE MEDICAL CARE IF:   Your pain does not go away within 2 hours.  You have a fever.  You keep throwing up (vomiting).  Your pain is felt only in portions of the abdomen, such as the right side or the left lower portion of the abdomen.  You pass bloody or black tarry stools. MAKE SURE YOU:  Understand these instructions.   Will watch your condition.   Will get help right away if you are not doing well or get worse.  Document Released: 11/18/2004 Document Revised: 11/29/2012 Document Reviewed:  10/18/2012 [facility] Patient Information 2014 [employer] [place].

## 2013-03-27 NOTE — Telephone Encounter (Signed)
Patient Information:  Caller Name: Jasmine DecemberSharon  Phone: [phone]  Patient: Katie Booker, Katie Booker  Gender: Female  DOB: [birth year]  Age: 68 Years  PCP: Eleonore ChiquitoKwiatkowski, Peter (Family Practice > 4778yrs old)  Office Follow Up:  Does the office need to follow up with this patient?: Yes  Instructions For The Office: Plan of care  RN Note:  Patient c/o upper abdominal pain "under ribcage"  and vomiting, body aches.  Afebrile. Vomiting green bile now.  Symptoms  Reason For Call & Symptoms: upper abdominal pain & vomiting  Reviewed Health History In EMR: Yes  Reviewed Medications In EMR: Yes  Reviewed Allergies In EMR: Yes  Reviewed Surgeries / Procedures: Yes  Date of Onset of Symptoms: 03/26/2013  Guideline(s) Used:  Abdominal Pain - Upper  Disposition Per Guideline:   Go to ED Now  Reason For Disposition Reached:   Severe abdominal pain (e.g., excruciating)  Advice Given:  N/Booker  Patient Will Follow Care Advice:  YES

## 2013-04-09 ENCOUNTER — Telehealth: Payer: Self-pay | Admitting: Internal Medicine

## 2013-04-09 NOTE — Telephone Encounter (Signed)
Pt needs new rx hydrocodone °

## 2013-04-09 NOTE — Telephone Encounter (Signed)
Spoke to pt told her I just refilled her Hydrocodone on 1/26 and was given 120 tablets can not refill till end of February. Pt verbalized understanding.

## 2013-04-14 ENCOUNTER — Other Ambulatory Visit: Payer: Self-pay | Admitting: Internal Medicine

## 2013-04-20 ENCOUNTER — Telehealth: Payer: Self-pay | Admitting: Internal Medicine

## 2013-04-20 MED ORDER — HYDROCODONE-ACETAMINOPHEN 5-325 MG PO TABS: ORAL_TABLET | ORAL | Status: DC

## 2013-04-20 NOTE — Telephone Encounter (Signed)
Rx ready for pick up and patient is aware 

## 2013-04-20 NOTE — Telephone Encounter (Signed)
Pt needs re-fill of HYDROcodone-acetaminophen (NORCO/VICODIN) 5-325 MG per tablet ° °

## 2013-04-26 ENCOUNTER — Encounter: Payer: Self-pay | Admitting: *Deleted

## 2013-04-27 ENCOUNTER — Ambulatory Visit (INDEPENDENT_AMBULATORY_CARE_PROVIDER_SITE_OTHER): Payer: Medicare Other | Admitting: Family Medicine

## 2013-04-27 ENCOUNTER — Encounter: Payer: Self-pay | Admitting: Family Medicine

## 2013-04-27 VITALS — BP 126/78 | HR 83 | Wt 195.0 lb

## 2013-04-27 DIAGNOSIS — R300 Dysuria: Secondary | ICD-10-CM

## 2013-04-27 DIAGNOSIS — N390 Urinary tract infection, site not specified: Secondary | ICD-10-CM

## 2013-04-27 LAB — POCT URINALYSIS DIPSTICK
Blood, UA: NEGATIVE
Glucose, UA: NEGATIVE
NITRITE UA: POSITIVE
Spec Grav, UA: 1.02
UROBILINOGEN UA: 1
pH, UA: 6

## 2013-04-27 MED ORDER — CIPROFLOXACIN HCL 500 MG PO TABS
500.0000 mg | ORAL_TABLET | Freq: Two times a day (BID) | ORAL | Status: DC
Start: 2013-04-27 — End: 2013-05-18

## 2013-04-27 NOTE — Progress Notes (Signed)
Pre visit review using our clinic review tool, if applicable. No additional management support is needed unless otherwise documented below in the visit note. 

## 2013-04-27 NOTE — Progress Notes (Signed)
Subjective:    Patient ID: Katie Booker, female    DOB: [birth year], 68 y.o.   MRN: [id number]  Dysuria  Associated symptoms include frequency. Pertinent negatives include no chills, hematuria, nausea or vomiting.   Acute visit for 6 day history of dysuria. She had onset Friday of severe frequency and burning. No fever. No nausea or vomiting. No flank pain. She has type 2 diabetes was somewhat poor control. Fasting blood sugar this morning 157. No recent UTI. No antibiotic allergies.  Past Medical History  Diagnosis Date  . HYPERLIPIDEMIA 09/18/2009    takes Lipitor daily  . HYPOTHYROIDISM 09/18/2009  . OSTEOARTHRITIS 09/18/2009  . Obesity   . Hemorrhoids   . Anal fissure   . Anxiety     takes Xanax prn sleep and anxiety  . DEPRESSION 09/18/2009    takes Celexa daily  . HYPERTENSION 09/18/2009    takes Atenolol daily and Lisinopril  . Histoplasmosis     hx of  . Shortness of breath     with exertion  . OSA (obstructive sleep apnea)     has a CPAP and sleep study report in epic from 2009  . History of bronchitis     last time around 1990  . Headache(784.0)     sinus HA  . Peripheral neuropathy     takes Lyrica daily  . Joint pain   . Joint swelling   . Chronic back pain     scoliosis/stenosis/spondylosis  . Skin spots, red     right above both elbows;pt states always there  . GERD 09/18/2009    takes Omeprazole daily  . Umbilical hernia   . ANEMIA-NOS 09/18/2009    takes Ferrous Sulfate daily  . History of colon polyps   . Urinary frequency   . Urinary urgency   . Insomnia     takes trazodone and ambien nightly  . Cancer     basal cell left cheek  . DIABETES MELLITUS, TYPE II 09/18/2009    takes Amaryl daily and Metformin   Past Surgical History  Procedure Laterality Date  . Tubal fulgaration  1991  . Colonoscopy  2007  . Esophagogastroduodenoscopy  2007  . Hemorrhoid surgery  03/23/2011    Procedure: HEMORRHOIDECTOMY;  Surgeon: Clovis Puhomas A. Cornett, MD;   Location: [facility];  Service: General;  Laterality: N/A;  lateral internal sphincterotomy and hemorrhoidectomy  . Sphincterotomy    . Thoracic discectomy N/A 12/15/2012    Procedure: Thoracic ten-eleven Thoracic laminectomy;  Surgeon: Barnett AbuHenry Elsner, MD;  Location: [facility];  Service: Neurosurgery;  Laterality: N/A;  Thoracic ten-eleven Thoracic laminectomy    reports that she quit smoking about 9 years ago. Her smoking use included Cigarettes. She has a 20 pack-year smoking history. She does not have any smokeless tobacco history on file. She reports that she drinks alcohol. She reports that she does not use illicit drugs. family history includes COPD in her father; Cancer in her maternal grandfather; Diabetes in her father. Allergies  Allergen Reactions  . Hydromorphone Hcl     Makes crazy      Review of Systems  Constitutional: Negative for fever, chills and appetite change.  Gastrointestinal: Negative for nausea, vomiting, abdominal pain, diarrhea and constipation.  Genitourinary: Positive for dysuria and frequency. Negative for hematuria.  Musculoskeletal: Negative for back pain.  Neurological: Negative for dizziness.       Objective:   Physical Exam  Constitutional: She appears well-developed and well-nourished.  HENT:  Head: Normocephalic and atraumatic.  Neck: Neck supple. No thyromegaly present.  Cardiovascular: Normal rate, regular rhythm and normal heart sounds.   Pulmonary/Chest: Breath sounds normal.  Abdominal: Soft. Bowel sounds are normal. There is no tenderness.          Assessment & Plan:  Dysuria. Suspect UTI. Urine culture sent. Cipro 500 mg twice a day for 7 days.

## 2013-04-27 NOTE — Patient Instructions (Signed)
Urinary Tract Infection  Urinary tract infections (UTIs) can develop anywhere along your urinary tract. Your urinary tract is your body's drainage system for removing wastes and extra water. Your urinary tract includes two kidneys, two ureters, a bladder, and a urethra. Your kidneys are a pair of bean-shaped organs. Each kidney is about the size of your fist. They are located below your ribs, one on each side of your spine.  CAUSES  Infections are caused by microbes, which are microscopic organisms, including fungi, viruses, and bacteria. These organisms are so small that they can only be seen through a microscope. Bacteria are the microbes that most commonly cause UTIs.  SYMPTOMS   Symptoms of UTIs may vary by age and gender of the patient and by the location of the infection. Symptoms in young women typically include a frequent and intense urge to urinate and a painful, burning feeling in the bladder or urethra during urination. Older women and men are more likely to be tired, shaky, and weak and have muscle aches and abdominal pain. A fever may mean the infection is in your kidneys. Other symptoms of a kidney infection include pain in your back or sides below the ribs, nausea, and vomiting.  DIAGNOSIS  To diagnose a UTI, your caregiver will ask you about your symptoms. Your caregiver also will ask to provide a urine sample. The urine sample will be tested for bacteria and white blood cells. White blood cells are made by your body to help fight infection.  TREATMENT   Typically, UTIs can be treated with medication. Because most UTIs are caused by a bacterial infection, they usually can be treated with the use of antibiotics. The choice of antibiotic and length of treatment depend on your symptoms and the type of bacteria causing your infection.  HOME CARE INSTRUCTIONS   If you were prescribed antibiotics, take them exactly as your caregiver instructs you. Finish the medication even if you feel better after you  have only taken some of the medication.   Drink enough water and fluids to keep your urine clear or pale yellow.   Avoid caffeine, tea, and carbonated beverages. They tend to irritate your bladder.   Empty your bladder often. Avoid holding urine for long periods of time.   Empty your bladder before and after sexual intercourse.   After a bowel movement, women should cleanse from front to back. Use each tissue only once.  SEEK MEDICAL CARE IF:    You have back pain.   You develop a fever.   Your symptoms do not begin to resolve within 3 days.  SEEK IMMEDIATE MEDICAL CARE IF:    You have severe back pain or lower abdominal pain.   You develop chills.   You have nausea or vomiting.   You have continued burning or discomfort with urination.  MAKE SURE YOU:    Understand these instructions.   Will watch your condition.   Will get help right away if you are not doing well or get worse.  Document Released: 11/18/2004 Document Revised: 08/10/2011 Document Reviewed: 03/19/2011  [employer] Patient Information 2014 [employer].

## 2013-04-30 LAB — URINE CULTURE: Colony Count: 50000

## 2013-05-12 ENCOUNTER — Other Ambulatory Visit: Payer: Self-pay | Admitting: Internal Medicine

## 2013-05-18 ENCOUNTER — Ambulatory Visit (INDEPENDENT_AMBULATORY_CARE_PROVIDER_SITE_OTHER): Payer: Medicare Other | Admitting: Internal Medicine

## 2013-05-18 ENCOUNTER — Encounter: Payer: Self-pay | Admitting: Internal Medicine

## 2013-05-18 VITALS — BP 130/80 | HR 96 | Temp 98.0°F | Resp 20 | Ht 62.0 in | Wt 196.0 lb

## 2013-05-18 DIAGNOSIS — E785 Hyperlipidemia, unspecified: Secondary | ICD-10-CM

## 2013-05-18 DIAGNOSIS — F329 Major depressive disorder, single episode, unspecified: Secondary | ICD-10-CM

## 2013-05-18 DIAGNOSIS — E039 Hypothyroidism, unspecified: Secondary | ICD-10-CM

## 2013-05-18 DIAGNOSIS — I10 Essential (primary) hypertension: Secondary | ICD-10-CM

## 2013-05-18 DIAGNOSIS — F3289 Other specified depressive episodes: Secondary | ICD-10-CM

## 2013-05-18 DIAGNOSIS — E119 Type 2 diabetes mellitus without complications: Secondary | ICD-10-CM

## 2013-05-18 LAB — TSH: TSH: 8.15 u[IU]/mL — AB (ref 0.35–5.50)

## 2013-05-18 LAB — HEMOGLOBIN A1C: Hgb A1c MFr Bld: 6.5 % (ref 4.6–6.5)

## 2013-05-18 MED ORDER — TRAZODONE HCL 100 MG PO TABS: ORAL_TABLET | ORAL | Status: DC

## 2013-05-18 MED ORDER — ALPRAZOLAM 0.5 MG PO TABS: ORAL_TABLET | ORAL | Status: DC

## 2013-05-18 MED ORDER — HYDROCODONE-ACETAMINOPHEN 5-325 MG PO TABS: ORAL_TABLET | ORAL | Status: DC

## 2013-05-18 MED ORDER — TRAZODONE HCL 100 MG PO TABS: 100.0000 mg | ORAL_TABLET | Freq: Every day | ORAL | Status: DC

## 2013-05-18 NOTE — Progress Notes (Signed)
Subjective:    Patient ID: Katie Booker, female    DOB: 1945/12/14, 68 y.o.   MRN: [id number]  HPI  68 year old patient who is in today for followup.  She has type 2 diabetes.  No recent hemoglobin A1c. Her chief complaint is worsening depression.  She presently is on citalopram, but has been on a number of agents in the past.  She has some insomnia, which has been treated fairly successfully with trazodone.  She has hypertension, dyslipidemia, and a history of spinal stenosis.  She underwent surgery in October of last year and she feels she is having worsening depression since that time.  She is followed closely by ophthalmology.  Past Medical History  Diagnosis Date  . HYPERLIPIDEMIA 09/18/2009    takes Lipitor daily  . HYPOTHYROIDISM 09/18/2009  . OSTEOARTHRITIS 09/18/2009  . Obesity   . Hemorrhoids   . Anal fissure   . Anxiety     takes Xanax prn sleep and anxiety  . DEPRESSION 09/18/2009    takes Celexa daily  . HYPERTENSION 09/18/2009    takes Atenolol daily and Lisinopril  . Histoplasmosis     hx of  . Shortness of breath     with exertion  . OSA (obstructive sleep apnea)     has a CPAP and sleep study report in epic from 2009  . History of bronchitis     last time around 1990  . Headache(784.0)     sinus HA  . Peripheral neuropathy     takes Lyrica daily  . Joint pain   . Joint swelling   . Chronic back pain     scoliosis/stenosis/spondylosis  . Skin spots, red     right above both elbows;pt states always there  . GERD 09/18/2009    takes Omeprazole daily  . Umbilical hernia   . ANEMIA-NOS 09/18/2009    takes Ferrous Sulfate daily  . History of colon polyps   . Urinary frequency   . Urinary urgency   . Insomnia     takes trazodone and ambien nightly  . Cancer     basal cell left cheek  . DIABETES MELLITUS, TYPE II 09/18/2009    takes Amaryl daily and Metformin    History   Social History  . Marital Status: Divorced    Spouse Name: N/A    Number of  Children: N/A  . Years of Education: N/A   Occupational History  . retired    Social History Main Topics  . Smoking status: Former Smoker -- 1.00 packs/day for 20 years    Types: Cigarettes    Quit date: 02/23/2004  . Smokeless tobacco: Not on file     Comment: quit 2007  . Alcohol Use: Yes     Comment: rare  . Drug Use: No  . Sexual Activity: No   Other Topics Concern  . Not on file   Social History Narrative  . No narrative on file    Past Surgical History  Procedure Laterality Date  . Tubal fulgaration  1991  . Colonoscopy  2007  . Esophagogastroduodenoscopy  2007  . Hemorrhoid surgery  03/23/2011    Procedure: HEMORRHOIDECTOMY;  Surgeon: Clovis Puhomas A. Cornett, MD;  Location: [facility];  Service: General;  Laterality: N/A;  lateral internal sphincterotomy and hemorrhoidectomy  . Sphincterotomy    . Thoracic discectomy N/A 12/15/2012    Procedure: Thoracic ten-eleven Thoracic laminectomy;  Surgeon: Barnett AbuHenry Elsner, MD;  Location: [facility];  Service:  Neurosurgery;  Laterality: N/A;  Thoracic ten-eleven Thoracic laminectomy    Family History  Problem Relation Age of Onset  . Diabetes Father   . COPD Father   . Cancer Maternal Grandfather     stomach    Allergies  Allergen Reactions  . Hydromorphone Hcl     Makes crazy    Current Outpatient Prescriptions on File Prior to Visit  Medication Sig Dispense Refill  . acetaminophen (TYLENOL) 650 MG CR tablet Take 650 mg by mouth every 8 (eight) hours as needed for pain.      Marland Kitchen atenolol (TENORMIN) 25 MG tablet Take 1 tablet (25 mg total) by mouth daily.  90 tablet  3  . atorvastatin (LIPITOR) 40 MG tablet Take one tablet by mouth one time daily  90 tablet  3  . beta carotene w/minerals (OCUVITE) tablet Take 1 tablet by mouth daily.      . Calcium Carbonate-Vit D-Min (GNP CALCIUM 600 PLUS D/MINERAL) 600-400 MG-UNIT TABS Take 1 tablet by mouth 2 (two) times daily.        . citalopram (CELEXA) 40 MG tablet  Take 40 mg by mouth daily.      Marland Kitchen gemfibrozil (LOPID) 600 MG tablet Take one tablet by mouth twice daily before meals  180 tablet  3  . glimepiride (AMARYL) 2 MG tablet TAKE ONE-HALF TABLET BY MOUTH DAILY   30 tablet  5  . glucose blood (ONE TOUCH ULTRA TEST) test strip 1 each by Other route daily. Use as instructed  100 each  6  . Lancets (ONETOUCH ULTRASOFT) lancets Use daily  100 each  6  . levothyroxine (SYNTHROID, LEVOTHROID) 150 MCG tablet Take one tablet by mouth one time daily  90 tablet  1  . lisinopril (PRINIVIL,ZESTRIL) 10 MG tablet TAKE ONE TABLET BY MOUTH ONE TIME DAILY   90 tablet  1  . loratadine (CLARITIN) 10 MG tablet Take 10 mg by mouth daily as needed for allergies.      . meloxicam (MOBIC) 15 MG tablet Take 15 mg by mouth daily.      . metFORMIN (GLUCOPHAGE) 1000 MG tablet take 1 tablet by mouth twice daily with meal  180 tablet  1  . methocarbamol (ROBAXIN) 500 MG tablet Take 1 tablet (500 mg total) by mouth every 6 (six) hours as needed.  60 tablet  3  . Multiple Vitamin (MULTIVITAMIN) tablet Take 1 tablet by mouth daily.        . Omega-3 Fatty Acids (FISH OIL) 1200 MG CAPS Take 2 capsules by mouth 2 (two) times daily.      Marland Kitchen omeprazole (PRILOSEC) 20 MG capsule Take 1 capsule (20 mg total) by mouth 2 (two) times daily.  180 capsule  4  . pregabalin (LYRICA) 75 MG capsule TAKE ONE CAPSULE BY MOUTH ONE TIME DAILY  90 capsule  1  . ondansetron (ZOFRAN) 8 MG tablet Take 1 tablet (8 mg total) by mouth every 8 (eight) hours as needed for nausea or vomiting.  20 tablet  0   No current facility-administered medications on file prior to visit.    BP 130/80  Pulse 96  Temp(Src) 98 F (36.7 C) (Oral)  Resp 20  Ht 5\' 2"  (1.575 m)  Wt 196 lb (88.905 kg)  BMI 35.84 kg/m2  SpO2 96%       Review of Systems  Constitutional: Negative.   HENT: Negative for congestion, dental problem, hearing loss, rhinorrhea, sinus pressure, sore throat and tinnitus.  Eyes: Negative for  pain, discharge and visual disturbance.  Respiratory: Negative for cough and shortness of breath.   Cardiovascular: Negative for chest pain, palpitations and leg swelling.  Gastrointestinal: Negative for nausea, vomiting, abdominal pain, diarrhea, constipation, blood in stool and abdominal distention.  Genitourinary: Negative for dysuria, urgency, frequency, hematuria, flank pain, vaginal bleeding, vaginal discharge, difficulty urinating, vaginal pain and pelvic pain.  Musculoskeletal: Negative for arthralgias, gait problem and joint swelling.  Skin: Negative for rash.  Neurological: Negative for dizziness, syncope, speech difficulty, weakness, numbness and headaches.  Hematological: Negative for adenopathy.  Psychiatric/Behavioral: Positive for sleep disturbance and dysphoric mood. Negative for behavioral problems and agitation. The patient is not nervous/anxious.        Objective:   Physical Exam  Constitutional: She is oriented to person, place, and time. She appears well-developed and well-nourished.  Weight 196  HENT:  Head: Normocephalic.  Right Ear: External ear normal.  Left Ear: External ear normal.  Mouth/Throat: Oropharynx is clear and moist.  Eyes: Conjunctivae and EOM are normal. Pupils are equal, round, and reactive to light.  Neck: Normal range of motion. Neck supple. No thyromegaly present.  Cardiovascular: Normal rate, regular rhythm, normal heart sounds and intact distal pulses.   Pulmonary/Chest: Effort normal and breath sounds normal.  Abdominal: Soft. Bowel sounds are normal. She exhibits no mass. There is no tenderness.  Musculoskeletal: Normal range of motion.  Lymphadenopathy:    She has no cervical adenopathy.  Neurological: She is alert and oriented to person, place, and time.  Skin: Skin is warm and dry. No rash noted.  Psychiatric: She has a normal mood and affect. Her behavior is normal.          Assessment & Plan:  Clinical depression.  We'll set  up for psychiatric referral Diabetes mellitus.  Check hemoglobin A1c Hypertension stable Dyslipidemia Hypothyroidism.  Check TSH

## 2013-05-18 NOTE — Progress Notes (Signed)
Pre-visit discussion using our clinic review tool. No additional management support is needed unless otherwise documented below in the visit note.  

## 2013-05-18 NOTE — Patient Instructions (Signed)
Limit your sodium (Salt) intake  You need to lose weight.  Consider a lower calorie diet and regular exercise.    It is important that you exercise regularly, at least 20 minutes 3 to 4 times per week.  If you develop chest pain or shortness of breath seek  medical attention.  Psychiatric consultation as discussed

## 2013-05-19 ENCOUNTER — Telehealth: Payer: Self-pay | Admitting: Internal Medicine

## 2013-05-19 NOTE — Telephone Encounter (Signed)
Relevant patient education assigned to patient using Emmi. ° °

## 2013-05-22 ENCOUNTER — Telehealth: Payer: Self-pay | Admitting: Internal Medicine

## 2013-05-22 NOTE — Telephone Encounter (Signed)
atenolol (TENORMIN) 25 MG tablet glucose blood (ONE TOUCH ULTRA TEST) test strip levothyroxine (SYNTHROID, LEVOTHROID) 150 MCG tablet

## 2013-05-22 NOTE — Telephone Encounter (Signed)
[facility] - [place], [place] - [address] is requesting re-fills on the following:  omeprazole (PRILOSEC) 20 MG capsule gemfibrozil (LOPID) 600 MG tablet citalopram (CELEXA) 40 MG tablet lisinopril (PRINIVIL,ZESTRIL) 10 MG tablet glimepiride (AMARYL) 2 MG tablet methocarbamol (ROBAXIN) 500 MG tablet

## 2013-05-23 MED ORDER — PREGABALIN 75 MG PO CAPS: ORAL_CAPSULE | ORAL | Status: DC

## 2013-05-23 MED ORDER — MELOXICAM 15 MG PO TABS: 15.0000 mg | ORAL_TABLET | Freq: Every day | ORAL | Status: DC

## 2013-05-23 MED ORDER — METFORMIN HCL 1000 MG PO TABS: ORAL_TABLET | ORAL | Status: DC

## 2013-05-23 MED ORDER — GLUCOSE BLOOD VI STRP: 1.0000 | ORAL_STRIP | Freq: Every day | Status: DC

## 2013-05-23 MED ORDER — METHOCARBAMOL 500 MG PO TABS: 500.0000 mg | ORAL_TABLET | Freq: Four times a day (QID) | ORAL | Status: DC | PRN

## 2013-05-23 MED ORDER — ATORVASTATIN CALCIUM 40 MG PO TABS
ORAL_TABLET | ORAL | Status: DC
Start: 2013-05-23 — End: 2013-08-20

## 2013-05-23 MED ORDER — LISINOPRIL 10 MG PO TABS: ORAL_TABLET | ORAL | Status: DC

## 2013-05-23 MED ORDER — GLIMEPIRIDE 2 MG PO TABS: ORAL_TABLET | ORAL | Status: DC

## 2013-05-23 MED ORDER — GEMFIBROZIL 600 MG PO TABS: ORAL_TABLET | ORAL | Status: DC

## 2013-05-23 MED ORDER — ATENOLOL 25 MG PO TABS: 25.0000 mg | ORAL_TABLET | Freq: Every day | ORAL | Status: DC

## 2013-05-23 MED ORDER — OMEPRAZOLE 20 MG PO CPDR: 20.0000 mg | DELAYED_RELEASE_CAPSULE | Freq: Two times a day (BID) | ORAL | Status: DC

## 2013-05-23 MED ORDER — CITALOPRAM HYDROBROMIDE 40 MG PO TABS: 40.0000 mg | ORAL_TABLET | Freq: Every day | ORAL | Status: DC

## 2013-05-23 MED ORDER — LEVOTHYROXINE SODIUM 150 MCG PO TABS: ORAL_TABLET | ORAL | Status: DC

## 2013-05-23 NOTE — Telephone Encounter (Signed)
Spoke to pt told her will send Rx's to [facility] as requested but can not sent Alprazolam or Trazodone due to you were given printed Rx for Alprazolam and Trazodone was sent to pharmacy already. Pt verbalized understanding.

## 2013-05-23 NOTE — Telephone Encounter (Signed)
Re-fill request received on the following from [facility]: ALPRAZolam (XANAX) 0.5 MG tablet metFORMIN (GLUCOPHAGE) 1000 MG tablet traZODone (DESYREL) 100 MG tablet

## 2013-05-23 NOTE — Telephone Encounter (Signed)
Rx refills done.

## 2013-05-23 NOTE — Telephone Encounter (Signed)
Additional re-fill request from [facility]: atorvastatin (LIPITOR) 40 MG tablet pregabalin (LYRICA) 75 MG capsule meloxicam (MOBIC) 15 MG tablet

## 2013-05-31 ENCOUNTER — Telehealth: Payer: Self-pay

## 2013-05-31 NOTE — Telephone Encounter (Signed)
Relevant patient education assigned to patient using Emmi. ° °

## 2013-06-20 ENCOUNTER — Telehealth: Payer: Self-pay | Admitting: Internal Medicine

## 2013-06-20 NOTE — Telephone Encounter (Signed)
Re-fill request for HYDROcodone-acetaminophen (NORCO/VICODIN) 5-325 MG per tablet

## 2013-06-21 MED ORDER — HYDROCODONE-ACETAMINOPHEN 5-325 MG PO TABS: ORAL_TABLET | ORAL | Status: DC

## 2013-06-21 NOTE — Telephone Encounter (Signed)
Pt notified Rx ready for pickup. Rx printed and signed.  

## 2013-07-17 ENCOUNTER — Encounter: Payer: Self-pay | Admitting: Family Medicine

## 2013-07-17 ENCOUNTER — Ambulatory Visit (INDEPENDENT_AMBULATORY_CARE_PROVIDER_SITE_OTHER): Payer: Medicare Other | Admitting: Family Medicine

## 2013-07-17 VITALS — BP 125/64 | HR 89 | Temp 99.0°F | Ht 62.0 in | Wt 199.0 lb

## 2013-07-17 DIAGNOSIS — Z23 Encounter for immunization: Secondary | ICD-10-CM

## 2013-07-17 DIAGNOSIS — L03119 Cellulitis of unspecified part of limb: Principal | ICD-10-CM

## 2013-07-17 DIAGNOSIS — L02619 Cutaneous abscess of unspecified foot: Secondary | ICD-10-CM

## 2013-07-17 MED ORDER — CEFTRIAXONE SODIUM 1 G IJ SOLR
1.0000 g | Freq: Once | INTRAMUSCULAR | Status: AC
  Administered 2013-07-17: 1 g via INTRAMUSCULAR

## 2013-07-17 MED ORDER — CEPHALEXIN 500 MG PO CAPS: 500.0000 mg | ORAL_CAPSULE | Freq: Four times a day (QID) | ORAL | Status: AC

## 2013-07-17 NOTE — Progress Notes (Signed)
   Subjective:    Patient ID: Katie Booker, female    DOB: [birth year], 68 y.o.   MRN: [id number]  HPI Here to check a wound on the bottom of the left foot. She had dropped a glass fixture in her kitchen last week and it broke in the floor. Then on 07-12-13 she was walking barefoot and apparently stepped on a fragment of this glass. She did not feel it at the time because she has some diabetic neuropathy in her feet. The next morning she noticed bleeding from her foot and saw a small laceration. No glass was seen. She as able to stop the bleeding but in the past 2 days she has noticed some swelling and redness in the area.    Review of Systems  Constitutional: Negative.   Skin: Positive for wound.       Objective:   Physical Exam  Constitutional: She appears well-developed and well-nourished. No distress.  Skin:  There is a small laceration on the ball of the left foot over the first metatarsal head. No bleeding. It is not tender. No foreign objects are felt or seen. There is some erythema around the site and also between the first and second toes. Mild edema over the forefoot          Assessment & Plan:  Early cellulitis from a wound, probably from stepping on a piece of glass. Given a TDaP. Given a Rocephin shot to be followed by high dose Keflex. Use warm Epsom salt soaks. Recheck if the redness is not down by the end of this week

## 2013-07-17 NOTE — Progress Notes (Signed)
Pre visit review using our clinic review tool, if applicable. No additional management support is needed unless otherwise documented below in the visit note. 

## 2013-07-17 NOTE — Addendum Note (Signed)
Addended by: Johnella Moloney on: 07/17/2013 03:37 PM   Modules accepted: Orders

## 2013-07-19 ENCOUNTER — Telehealth: Payer: Self-pay | Admitting: Internal Medicine

## 2013-07-19 NOTE — Telephone Encounter (Signed)
Pt requesting refill of HYDROcodone-acetaminophen (NORCO/VICODIN) 5-325 MG per tablet 

## 2013-07-20 MED ORDER — HYDROCODONE-ACETAMINOPHEN 5-325 MG PO TABS: ORAL_TABLET | ORAL | Status: DC

## 2013-07-20 NOTE — Telephone Encounter (Signed)
Pt notified Rx ready for pickup. Rx printed and signed.  

## 2013-08-17 ENCOUNTER — Telehealth: Payer: Self-pay | Admitting: Internal Medicine

## 2013-08-17 NOTE — Telephone Encounter (Signed)
Pt is needing new rx for HYDROcodone-acetaminophen (NORCO/VICODIN) 5-325 MG per tablet °Please call when available for pick up °

## 2013-08-20 ENCOUNTER — Encounter: Payer: Self-pay | Admitting: Internal Medicine

## 2013-08-20 ENCOUNTER — Ambulatory Visit (INDEPENDENT_AMBULATORY_CARE_PROVIDER_SITE_OTHER): Payer: Medicare Other | Admitting: Internal Medicine

## 2013-08-20 VITALS — BP 120/70 | HR 74 | Temp 98.6°F | Resp 20 | Ht 62.0 in | Wt 200.0 lb

## 2013-08-20 DIAGNOSIS — E669 Obesity, unspecified: Secondary | ICD-10-CM

## 2013-08-20 DIAGNOSIS — I10 Essential (primary) hypertension: Secondary | ICD-10-CM

## 2013-08-20 DIAGNOSIS — E039 Hypothyroidism, unspecified: Secondary | ICD-10-CM

## 2013-08-20 DIAGNOSIS — E785 Hyperlipidemia, unspecified: Secondary | ICD-10-CM

## 2013-08-20 DIAGNOSIS — E119 Type 2 diabetes mellitus without complications: Secondary | ICD-10-CM

## 2013-08-20 DIAGNOSIS — E6609 Other obesity due to excess calories: Secondary | ICD-10-CM

## 2013-08-20 DIAGNOSIS — M549 Dorsalgia, unspecified: Secondary | ICD-10-CM

## 2013-08-20 DIAGNOSIS — G8929 Other chronic pain: Secondary | ICD-10-CM

## 2013-08-20 LAB — HEMOGLOBIN A1C: HEMOGLOBIN A1C: 6.9 % — AB (ref 4.6–6.5)

## 2013-08-20 MED ORDER — HYDROCODONE-ACETAMINOPHEN 5-325 MG PO TABS: ORAL_TABLET | ORAL | Status: DC

## 2013-08-20 MED ORDER — ALPRAZOLAM 0.5 MG PO TABS: ORAL_TABLET | ORAL | Status: DC

## 2013-08-20 NOTE — Telephone Encounter (Signed)
Left detailed message Rx ready for pickup. Rx printed and signed. 

## 2013-08-20 NOTE — Patient Instructions (Signed)
Limit your sodium (Salt) intake    It is important that you exercise regularly, at least 20 minutes 3 to 4 times per week.  If you develop chest pain or shortness of breath seek  medical attention.  You need to lose weight.  Consider a lower calorie diet and regular exercise.   Please check your hemoglobin A1c every 3 months   

## 2013-08-20 NOTE — Progress Notes (Signed)
Pre-visit discussion using our clinic review tool. No additional management support is needed unless otherwise documented below in the visit note.  

## 2013-08-20 NOTE — Progress Notes (Signed)
Subjective:    Patient ID: Katie Booker, female    DOB: 01-Sep-1945, 68 y.o.   MRN: [id number]  HPI   68 year old patient who is seen today for followup of type 2 diabetes.  She is followed closely by ophthalmology due to macular degeneration.  Blood sugars remained well-controlled She has hypertension, dyslipidemia.  She remains on statin therapy, which he tolerates well.  She has treated hypothyroidism. Denies any cardiopulmonary complaints.  Past Medical History  Diagnosis Date  . HYPERLIPIDEMIA 09/18/2009    takes Lipitor daily  . HYPOTHYROIDISM 09/18/2009  . OSTEOARTHRITIS 09/18/2009  . Obesity   . Hemorrhoids   . Anal fissure   . Anxiety     takes Xanax prn sleep and anxiety  . DEPRESSION 09/18/2009    takes Celexa daily  . HYPERTENSION 09/18/2009    takes Atenolol daily and Lisinopril  . Histoplasmosis     hx of  . Shortness of breath     with exertion  . OSA (obstructive sleep apnea)     has a CPAP and sleep study report in epic from 2009  . History of bronchitis     last time around 1990  . Headache(784.0)     sinus HA  . Peripheral neuropathy     takes Lyrica daily  . Joint pain   . Joint swelling   . Chronic back pain     scoliosis/stenosis/spondylosis  . Skin spots, red     right above both elbows;pt states always there  . GERD 09/18/2009    takes Omeprazole daily  . Umbilical hernia   . ANEMIA-NOS 09/18/2009    takes Ferrous Sulfate daily  . History of colon polyps   . Urinary frequency   . Urinary urgency   . Insomnia     takes trazodone and ambien nightly  . Cancer     basal cell left cheek  . DIABETES MELLITUS, TYPE II 09/18/2009    takes Amaryl daily and Metformin    History   Social History  . Marital Status: Divorced    Spouse Name: N/A    Number of Children: N/A  . Years of Education: N/A   Occupational History  . retired    Social History Main Topics  . Smoking status: Former Smoker -- 1.00 packs/day for 20 years    Types:  Cigarettes    Quit date: 02/23/2004  . Smokeless tobacco: Never Used     Comment: quit 2007  . Alcohol Use: Yes     Comment: rare  . Drug Use: No  . Sexual Activity: No   Other Topics Concern  . Not on file   Social History Narrative  . No narrative on file    Past Surgical History  Procedure Laterality Date  . Tubal fulgaration  1991  . Colonoscopy  2007  . Esophagogastroduodenoscopy  2007  . Hemorrhoid surgery  03/23/2011    Procedure: HEMORRHOIDECTOMY;  Surgeon: Clovis Pu. Cornett, MD;  Location: [facility];  Service: General;  Laterality: N/A;  lateral internal sphincterotomy and hemorrhoidectomy  . Sphincterotomy    . Thoracic discectomy N/A 12/15/2012    Procedure: Thoracic ten-eleven Thoracic laminectomy;  Surgeon: Barnett Abu, MD;  Location: [facility];  Service: Neurosurgery;  Laterality: N/A;  Thoracic ten-eleven Thoracic laminectomy    Family History  Problem Relation Age of Onset  . Diabetes Father   . COPD Father   . Cancer Maternal Grandfather     stomach  Allergies  Allergen Reactions  . Hydromorphone Hcl     Makes crazy    Current Outpatient Prescriptions on File Prior to Visit  Medication Sig Dispense Refill  . acetaminophen (TYLENOL) 650 MG CR tablet Take 650 mg by mouth every 8 (eight) hours as needed for pain.      Marland Kitchen. atenolol (TENORMIN) 25 MG tablet Take 1 tablet (25 mg total) by mouth daily.  90 tablet  3  . beta carotene w/minerals (OCUVITE) tablet Take 1 tablet by mouth daily.      . Calcium Carbonate-Vit D-Min (GNP CALCIUM 600 PLUS D/MINERAL) 600-400 MG-UNIT TABS Take 1 tablet by mouth 2 (two) times daily.        . Carboxymethylcellulose Sodium (LUBRICANT EYE DROPS OP) Apply 2 drops to eye 4 (four) times daily as needed.      . citalopram (CELEXA) 40 MG tablet Take 1 tablet (40 mg total) by mouth daily.  90 tablet  3  . gemfibrozil (LOPID) 600 MG tablet Take one tablet by mouth twice daily before meals  180 tablet  3  .  glimepiride (AMARYL) 2 MG tablet TAKE ONE-HALF TABLET BY MOUTH DAILY  45 tablet  3  . glucose blood (ONE TOUCH ULTRA TEST) test strip 1 each by Other route daily. Use as instructed  100 each  4  . HYDROcodone-acetaminophen (NORCO/VICODIN) 5-325 MG per tablet 1 by mouth every 6 hours as needed for pain **DO NOT EXCEED 4 GM OF TYLENOL IN 24 HOURS**  120 tablet  0  . Lancets (ONETOUCH ULTRASOFT) lancets Use daily  100 each  6  . levothyroxine (SYNTHROID, LEVOTHROID) 150 MCG tablet Take one tablet by mouth one time daily  90 tablet  3  . lisinopril (PRINIVIL,ZESTRIL) 10 MG tablet TAKE ONE TABLET BY MOUTH ONE TIME DAILY  90 tablet  3  . loratadine (CLARITIN) 10 MG tablet Take 10 mg by mouth daily as needed for allergies.      . meloxicam (MOBIC) 15 MG tablet Take 1 tablet (15 mg total) by mouth daily.  90 tablet  3  . metFORMIN (GLUCOPHAGE) 1000 MG tablet take 1 tablet by mouth twice daily with meal  180 tablet  3  . methocarbamol (ROBAXIN) 500 MG tablet Take 1 tablet (500 mg total) by mouth every 6 (six) hours as needed.  90 tablet  3  . Multiple Vitamin (MULTIVITAMIN) tablet Take 1 tablet by mouth daily.        . Omega-3 Fatty Acids (FISH OIL) 1200 MG CAPS Take 2 capsules by mouth 2 (two) times daily.      Marland Kitchen. omeprazole (PRILOSEC) 20 MG capsule Take 1 capsule (20 mg total) by mouth 2 (two) times daily.  180 capsule  3  . pregabalin (LYRICA) 75 MG capsule TAKE ONE CAPSULE BY MOUTH ONE TIME DAILY  90 capsule  3  . tobramycin (TOBREX) 0.3 % ophthalmic solution Place 1 drop into the right eye every 6 (six) hours.       . traZODone (DESYREL) 100 MG tablet 2 tablets at bedtime  60 tablet  5  . PATADAY 0.2 % SOLN        No current facility-administered medications on file prior to visit.    BP 120/70  Pulse 74  Temp(Src) 98.6 F (37 C) (Oral)  Resp 20  Ht 5\' 2"  (1.575 m)  Wt 200 lb (90.719 kg)  BMI 36.57 kg/m2  SpO2 97%      Review of Systems  Constitutional: Negative.  HENT: Negative for  congestion, dental problem, hearing loss, rhinorrhea, sinus pressure, sore throat and tinnitus.   Eyes: Negative for pain, discharge and visual disturbance.  Respiratory: Negative for cough and shortness of breath.   Cardiovascular: Negative for chest pain, palpitations and leg swelling.  Gastrointestinal: Negative for nausea, vomiting, abdominal pain, diarrhea, constipation, blood in stool and abdominal distention.  Genitourinary: Negative for dysuria, urgency, frequency, hematuria, flank pain, vaginal bleeding, vaginal discharge, difficulty urinating, vaginal pain and pelvic pain.  Musculoskeletal: Negative for arthralgias, gait problem and joint swelling.  Skin: Negative for rash.  Neurological: Negative for dizziness, syncope, speech difficulty, weakness, numbness and headaches.  Hematological: Negative for adenopathy.  Psychiatric/Behavioral: Negative for behavioral problems, dysphoric mood and agitation. The patient is not nervous/anxious.        Objective:   Physical Exam  Constitutional: She is oriented to person, place, and time. She appears well-developed and well-nourished.  HENT:  Head: Normocephalic.  Right Ear: External ear normal.  Left Ear: External ear normal.  Mouth/Throat: Oropharynx is clear and moist.  Eyes: Conjunctivae and EOM are normal. Pupils are equal, round, and reactive to light.  Neck: Normal range of motion. Neck supple. No thyromegaly present.  Cardiovascular: Normal rate, regular rhythm and normal heart sounds.   Decreased right dorsalis pedis pulse  Pulmonary/Chest: Effort normal and breath sounds normal.  Abdominal: Soft. Bowel sounds are normal. She exhibits no mass. There is no tenderness.  Musculoskeletal: Normal range of motion.  Lymphadenopathy:    She has no cervical adenopathy.  Neurological: She is alert and oriented to person, place, and time.  Skin: Skin is warm and dry. No rash noted.  Psychiatric: She has a normal mood and affect. Her  behavior is normal.          Assessment & Plan:   Diabetes mellitus.  We'll check a hemoglobin A1c  Hypertension stable  Dyslipidemia.  Continue statin therapy   Followup ophthalmology  Return here 3 months  Lab Results  Component Value Date   HGBA1C 6.9* 08/20/2013

## 2013-08-20 NOTE — Progress Notes (Signed)
   Subjective:    Patient ID: Katie Booker, female    DOB: [birth year], 68 y.o.   MRN: [id number]  HPI Wt Readings from Last 3 Encounters:  08/20/13 200 lb (90.719 kg)  07/17/13 199 lb (90.266 kg)  05/18/13 196 lb (88.905 kg)   See above  Review of Systems See above    Objective:   Physical Exam  See above      Assessment & Plan:  See above

## 2013-08-27 ENCOUNTER — Other Ambulatory Visit: Payer: Self-pay | Admitting: Internal Medicine

## 2013-08-28 NOTE — Telephone Encounter (Signed)
[facility] is requesting re-fill on pregabalin (LYRICA) 75 MG capsule

## 2013-08-30 ENCOUNTER — Telehealth: Payer: Self-pay | Admitting: Internal Medicine

## 2013-08-30 NOTE — Telephone Encounter (Signed)
[facility] - [place] - [address] is requesting re-fill on pregabalin (LYRICA) 75 MG capsule

## 2013-09-03 MED ORDER — PREGABALIN 75 MG PO CAPS: ORAL_CAPSULE | ORAL | Status: DC

## 2013-09-03 NOTE — Telephone Encounter (Signed)
Rx faxed to [facility]. 

## 2013-09-17 ENCOUNTER — Telehealth: Payer: Self-pay | Admitting: Internal Medicine

## 2013-09-17 NOTE — Telephone Encounter (Signed)
Pt requesting refill of HYDROcodone-acetaminophen (NORCO/VICODIN) 5-325 MG per tablet 

## 2013-09-19 MED ORDER — HYDROCODONE-ACETAMINOPHEN 5-325 MG PO TABS: ORAL_TABLET | ORAL | Status: DC

## 2013-09-19 NOTE — Telephone Encounter (Signed)
Pt notified Rx ready for pickup. Rx printed and signed by Dr. Burchette.  

## 2013-09-20 ENCOUNTER — Other Ambulatory Visit (INDEPENDENT_AMBULATORY_CARE_PROVIDER_SITE_OTHER): Payer: Medicare Other

## 2013-09-20 DIAGNOSIS — E785 Hyperlipidemia, unspecified: Secondary | ICD-10-CM

## 2013-09-20 LAB — LIPID PANEL
CHOLESTEROL: 344 mg/dL — AB (ref 0–200)
HDL: 47.5 mg/dL (ref >39.00)
NONHDL: 296.5
Total CHOL/HDL Ratio: 7
Triglycerides: 316 mg/dL — ABNORMAL HIGH (ref 0.0–149.0)
VLDL: 63.2 mg/dL — AB (ref 0.0–40.0)

## 2013-09-20 LAB — LDL CHOLESTEROL, DIRECT: Direct LDL: 243.2 mg/dL

## 2013-09-25 ENCOUNTER — Ambulatory Visit (INDEPENDENT_AMBULATORY_CARE_PROVIDER_SITE_OTHER)
Admission: RE | Admit: 2013-09-25 | Discharge: 2013-09-25 | Disposition: A | Discharge status: Home / Self Care | Payer: Medicare Other | Source: Ambulatory Visit | Attending: Internal Medicine | Admitting: Internal Medicine

## 2013-09-25 ENCOUNTER — Encounter: Payer: Self-pay | Admitting: Internal Medicine

## 2013-09-25 ENCOUNTER — Ambulatory Visit (INDEPENDENT_AMBULATORY_CARE_PROVIDER_SITE_OTHER): Payer: Medicare Other | Admitting: Internal Medicine

## 2013-09-25 VITALS — BP 122/70 | HR 67 | Temp 99.1°F | Wt 197.0 lb

## 2013-09-25 DIAGNOSIS — R109 Unspecified abdominal pain: Secondary | ICD-10-CM

## 2013-09-25 DIAGNOSIS — R1031 Right lower quadrant pain: Secondary | ICD-10-CM

## 2013-09-25 DIAGNOSIS — Z9889 Other specified postprocedural states: Secondary | ICD-10-CM

## 2013-09-25 LAB — POCT URINALYSIS DIPSTICK
Bilirubin, UA: NEGATIVE
Blood, UA: NEGATIVE
Glucose, UA: NEGATIVE
KETONES UA: NEGATIVE
Leukocytes, UA: NEGATIVE
Nitrite, UA: NEGATIVE
PROTEIN UA: NEGATIVE
SPEC GRAV UA: 1.015
Urobilinogen, UA: 0.2
pH, UA: 6

## 2013-09-25 NOTE — Progress Notes (Signed)
Pre visit review using our clinic review tool, if applicable. No additional management support is needed unless otherwise documented below in the visit note.   Chief Complaint  Patient presents with  . Abdominal Pain    The pain started in her hip one week ago.  Now has pain in her lower rt abdomen and lower rt back.  . Back Pain    HPI: Patient comes in today for SDA for  new problem evaluation. pcp has full schedule   1 week ago  New  Right hip right buttocks inside  Deep hip pain  Gotten worse  And uncertain if related to abdomen   2 days ago having severe lower right  abd pain episodes  A drawing and  And went to back right lower back  Worse  Where leg attach to groin not to knee  mild nausea no sig change in bowel habits no uti sx or hematuria  Worse with weight bearing  Lays down.  But when episode hits  Nothing helps . hasnt had episodes when sleeping usually when up in the am.  Last ? Minutes  Hours at one point.  ROS: See pertinent positives and negatives per HPI. No fever chills  Had back surgery [facility] in October  Apparently stable hx of djd no other joints except spine at this point. Past Medical History  Diagnosis Date  . HYPERLIPIDEMIA 09/18/2009    takes Lipitor daily  . HYPOTHYROIDISM 09/18/2009  . OSTEOARTHRITIS 09/18/2009  . Obesity   . Hemorrhoids   . Anal fissure   . Anxiety     takes Xanax prn sleep and anxiety  . DEPRESSION 09/18/2009    takes Celexa daily  . HYPERTENSION 09/18/2009    takes Atenolol daily and Lisinopril  . Histoplasmosis     hx of  . Shortness of breath     with exertion  . OSA (obstructive sleep apnea)     has a CPAP and sleep study report in epic from 2009  . History of bronchitis     last time around 1990  . Headache(784.0)     sinus HA  . Peripheral neuropathy     takes Lyrica daily  . Joint pain   . Joint swelling   . Chronic back pain     scoliosis/stenosis/spondylosis  . Skin spots, red     right above both elbows;pt states  always there  . GERD 09/18/2009    takes Omeprazole daily  . Umbilical hernia   . ANEMIA-NOS 09/18/2009    takes Ferrous Sulfate daily  . History of colon polyps   . Urinary frequency   . Urinary urgency   . Insomnia     takes trazodone and ambien nightly  . Cancer     basal cell left cheek  . DIABETES MELLITUS, TYPE II 09/18/2009    takes Amaryl daily and Metformin    Family History  Problem Relation Age of Onset  . Diabetes Father   . COPD Father   . Cancer Maternal Grandfather     stomach    History   Social History  . Marital Status: Divorced    Spouse Name: N/A    Number of Children: N/A  . Years of Education: N/A   Occupational History  . retired    Social History Main Topics  . Smoking status: Former Smoker -- 1.00 packs/day for 20 years    Types: Cigarettes    Quit date: 02/23/2004  . Smokeless tobacco: Never  Used     Comment: quit 2007  . Alcohol Use: Yes     Comment: rare  . Drug Use: No  . Sexual Activity: No   Other Topics Concern  . None   Social History Narrative  . None    Outpatient Encounter Prescriptions as of 09/25/2013  Medication Sig  . acetaminophen (TYLENOL) 650 MG CR tablet Take 650 mg by mouth every 8 (eight) hours as needed for pain.  Marland Kitchen. ALPRAZolam (XANAX) 0.5 MG tablet TAKE ONE TABLET BY MOUTH TWICE DAILY  . atenolol (TENORMIN) 25 MG tablet Take 1 tablet (25 mg total) by mouth daily.  Marland Kitchen. atorvastatin (LIPITOR) 40 MG tablet   . beta carotene w/minerals (OCUVITE) tablet Take 1 tablet by mouth daily.  . Calcium Carbonate-Vit D-Min (GNP CALCIUM 600 PLUS D/MINERAL) 600-400 MG-UNIT TABS Take 1 tablet by mouth 2 (two) times daily.    . Carboxymethylcellulose Sodium (LUBRICANT EYE DROPS OP) Apply 2 drops to eye 4 (four) times daily as needed.  . citalopram (CELEXA) 40 MG tablet Take 1 tablet (40 mg total) by mouth daily.  Marland Kitchen. gemfibrozil (LOPID) 600 MG tablet Take one tablet by mouth twice daily before meals  . glimepiride (AMARYL) 2 MG  tablet TAKE ONE-HALF TABLET BY MOUTH DAILY  . glucose blood (ONE TOUCH ULTRA TEST) test strip 1 each by Other route daily. Use as instructed  . HYDROcodone-acetaminophen (NORCO/VICODIN) 5-325 MG per tablet 1 by mouth every 6 hours as needed for pain **DO NOT EXCEED 4 GM OF TYLENOL IN 24 HOURS**  . Lancets (ONETOUCH ULTRASOFT) lancets Use daily  . levothyroxine (SYNTHROID, LEVOTHROID) 150 MCG tablet Take one tablet by mouth one time daily  . lisinopril (PRINIVIL,ZESTRIL) 10 MG tablet TAKE ONE TABLET BY MOUTH ONE TIME DAILY  . loratadine (CLARITIN) 10 MG tablet Take 10 mg by mouth daily as needed for allergies.  . meloxicam (MOBIC) 15 MG tablet Take 1 tablet (15 mg total) by mouth daily.  . metFORMIN (GLUCOPHAGE) 1000 MG tablet take 1 tablet by mouth twice daily with meal  . methocarbamol (ROBAXIN) 500 MG tablet Take 1 tablet (500 mg  total) by mouth every 6  (six) hours as needed.  . Multiple Vitamin (MULTIVITAMIN) tablet Take 1 tablet by mouth daily.    . Omega-3 Fatty Acids (FISH OIL) 1200 MG CAPS Take 2 capsules by mouth 2 (two) times daily.  Marland Kitchen. omeprazole (PRILOSEC) 20 MG capsule Take 1 capsule (20 mg total) by mouth 2 (two) times daily.  . pregabalin (LYRICA) 75 MG capsule TAKE ONE CAPSULE BY MOUTH ONE TIME DAILY  . tobramycin (TOBREX) 0.3 % ophthalmic solution Place 1 drop into the right eye every 6 (six) hours.   . traZODone (DESYREL) 100 MG tablet 2 tablets at bedtime  . PATADAY 0.2 % SOLN     EXAM:  BP 122/70  Pulse 67  Temp(Src) 99.1 F (37.3 C) (Oral)  Wt 197 lb (89.359 kg)  SpO2 97%  Body mass index is 36.02 kg/(m^2).  GENERAL: vitals reviewed and listed above, alert, oriented, appears well hydrated and in no acute distress alert  In wv but can arise   HEENT: atraumatic, conjunctiva  clear, no obvious abnormalities on inspection of external nose and earsNECK: no obvious masses on inspection palpation  LUNGS: clear to auscultation bilaterally, no wheezes, rales or rhonchi,   CV: HRRR, no clubbing cyanosis or  peripheral edema nl cap refill  Abdomen:  umbi hernia  Protuberant no ascites  No obv mass tender  right lateral gutter  And groin area noted  But no obv herna noted normal bowel sounds without hepatosplenomegaly, no guarding rebound or masses no   MS: moves all extremities  Points to right back si area and to fgroin as area of pain  nhip  Passive rom ok  No rash  Hard to get to table but independent with caution  PSYCH: cooperative,  cognition intact UA clear  ASSESSMENT AND PLAN:  Discussed the following assessment and plan:  Abdominal pain, other specified site - Plan: POCT urinalysis dipstick, DG Hip Complete Right, CT Abdomen Pelvis Wo Contrast  Groin pain, right - Plan: DG Hip Complete Right, CT Abdomen Pelvis Wo Contrast  Previous back surgery - [facility] 10 14    Right groin pain radiating to back.  Level of surgery .  uncertain cause doesn't really seem like  Gi issues  No rash like shingles  Suppose renal stone possible  but back pain is lower than expected  Hx of dhd and back surgery. Could be a hip back related pain. No systemic sx otherwise   Plan fu depending on scan  and xray  -Patient advised to return or notify health care team  if symptoms worsen ,persist or new concerns arise.  Patient Instructions  Uncertain cause of pain but seems could  be related to hip and or back.   Less likely kidney stone or such .  Get hip  X ray . Today . May need to see  Ortho pedic .     Neta Mends. Panosh M.D.

## 2013-09-25 NOTE — Patient Instructions (Signed)
Uncertain cause of pain but seems could  be related to hip and or back.   Less likely kidney stone or such .  Get hip  X ray . Today . May need to see  Ortho pedic .

## 2013-09-27 ENCOUNTER — Encounter: Payer: Self-pay | Admitting: Internal Medicine

## 2013-09-27 ENCOUNTER — Ambulatory Visit (INDEPENDENT_AMBULATORY_CARE_PROVIDER_SITE_OTHER): Payer: Medicare Other | Admitting: Internal Medicine

## 2013-09-27 VITALS — BP 140/90 | HR 96 | Temp 98.4°F | Resp 20 | Ht 62.0 in | Wt 194.0 lb

## 2013-09-27 DIAGNOSIS — E119 Type 2 diabetes mellitus without complications: Secondary | ICD-10-CM

## 2013-09-27 DIAGNOSIS — E039 Hypothyroidism, unspecified: Secondary | ICD-10-CM

## 2013-09-27 DIAGNOSIS — I10 Essential (primary) hypertension: Secondary | ICD-10-CM

## 2013-09-27 DIAGNOSIS — G8929 Other chronic pain: Secondary | ICD-10-CM

## 2013-09-27 DIAGNOSIS — M549 Dorsalgia, unspecified: Secondary | ICD-10-CM

## 2013-09-27 MED ORDER — METHYLPREDNISOLONE ACETATE 80 MG/ML IJ SUSP
80.0000 mg | Freq: Once | INTRAMUSCULAR | Status: AC
  Administered 2013-09-27: 80 mg via INTRAMUSCULAR

## 2013-09-27 NOTE — Progress Notes (Signed)
Subjective:    Patient ID: Katie Booker, female    DOB: 10-30-1945, 68 y.o.   MRN: [id number]  HPI  68 year old patient who is seen today in followup.  She was evaluated 2 days ago with a week and half history of right lateral right buttock and right groin discomfort.  Evaluation has included an abdominal and pelvic CT scan, as well as radiographs of the right hip.  These were reviewed.  Pain was well through the night and is minimal in the morning, but worsens throughout the day with weightbearing.  At times.  Pain is described as quite severe.  Past Medical History  Diagnosis Date  . HYPERLIPIDEMIA 09/18/2009    takes Lipitor daily  . HYPOTHYROIDISM 09/18/2009  . OSTEOARTHRITIS 09/18/2009  . Obesity   . Hemorrhoids   . Anal fissure   . Anxiety     takes Xanax prn sleep and anxiety  . DEPRESSION 09/18/2009    takes Celexa daily  . HYPERTENSION 09/18/2009    takes Atenolol daily and Lisinopril  . Histoplasmosis     hx of  . Shortness of breath     with exertion  . OSA (obstructive sleep apnea)     has a CPAP and sleep study report in epic from 2009  . History of bronchitis     last time around 1990  . Headache(784.0)     sinus HA  . Peripheral neuropathy     takes Lyrica daily  . Joint pain   . Joint swelling   . Chronic back pain     scoliosis/stenosis/spondylosis  . Skin spots, red     right above both elbows;pt states always there  . GERD 09/18/2009    takes Omeprazole daily  . Umbilical hernia   . ANEMIA-NOS 09/18/2009    takes Ferrous Sulfate daily  . History of colon polyps   . Urinary frequency   . Urinary urgency   . Insomnia     takes trazodone and ambien nightly  . Cancer     basal cell left cheek  . DIABETES MELLITUS, TYPE II 09/18/2009    takes Amaryl daily and Metformin    History   Social History  . Marital Status: Divorced    Spouse Name: N/A    Number of Children: N/A  . Years of Education: N/A   Occupational History  . retired    Social  History Main Topics  . Smoking status: Former Smoker -- 1.00 packs/day for 20 years    Types: Cigarettes    Quit date: 02/23/2004  . Smokeless tobacco: Never Used     Comment: quit 2007  . Alcohol Use: Yes     Comment: rare  . Drug Use: No  . Sexual Activity: No   Other Topics Concern  . Not on file   Social History Narrative  . No narrative on file    Past Surgical History  Procedure Laterality Date  . Tubal fulgaration  1991  . Colonoscopy  2007  . Esophagogastroduodenoscopy  2007  . Hemorrhoid surgery  03/23/2011    Procedure: HEMORRHOIDECTOMY;  Surgeon: Clovis Pu. Cornett, MD;  Location: [facility];  Service: General;  Laterality: N/A;  lateral internal sphincterotomy and hemorrhoidectomy  . Sphincterotomy    . Thoracic discectomy N/A 12/15/2012    Procedure: Thoracic ten-eleven Thoracic laminectomy;  Surgeon: Barnett Abu, MD;  Location: [facility];  Service: Neurosurgery;  Laterality: N/A;  Thoracic ten-eleven Thoracic laminectomy  Family History  Problem Relation Age of Onset  . Diabetes Father   . COPD Father   . Cancer Maternal Grandfather     stomach    Allergies  Allergen Reactions  . Hydromorphone Hcl     Makes crazy    Current Outpatient Prescriptions on File Prior to Visit  Medication Sig Dispense Refill  . acetaminophen (TYLENOL) 650 MG CR tablet Take 650 mg by mouth every 8 (eight) hours as needed for pain.      Marland Kitchen ALPRAZolam (XANAX) 0.5 MG tablet TAKE ONE TABLET BY MOUTH TWICE DAILY  60 tablet  5  . atenolol (TENORMIN) 25 MG tablet Take 1 tablet (25 mg total) by mouth daily.  90 tablet  3  . atorvastatin (LIPITOR) 40 MG tablet       . beta carotene w/minerals (OCUVITE) tablet Take 1 tablet by mouth daily.      . Calcium Carbonate-Vit D-Min (GNP CALCIUM 600 PLUS D/MINERAL) 600-400 MG-UNIT TABS Take 1 tablet by mouth 2 (two) times daily.        . Carboxymethylcellulose Sodium (LUBRICANT EYE DROPS OP) Apply 2 drops to eye 4 (four)  times daily as needed.      . citalopram (CELEXA) 40 MG tablet Take 1 tablet (40 mg total) by mouth daily.  90 tablet  3  . gemfibrozil (LOPID) 600 MG tablet Take one tablet by mouth twice daily before meals  180 tablet  3  . glimepiride (AMARYL) 2 MG tablet TAKE ONE-HALF TABLET BY MOUTH DAILY  45 tablet  3  . glucose blood (ONE TOUCH ULTRA TEST) test strip 1 each by Other route daily. Use as instructed  100 each  4  . HYDROcodone-acetaminophen (NORCO/VICODIN) 5-325 MG per tablet 1 by mouth every 6 hours as needed for pain **DO NOT EXCEED 4 GM OF TYLENOL IN 24 HOURS**  120 tablet  0  . Lancets (ONETOUCH ULTRASOFT) lancets Use daily  100 each  6  . levothyroxine (SYNTHROID, LEVOTHROID) 150 MCG tablet Take one tablet by mouth one time daily  90 tablet  3  . lisinopril (PRINIVIL,ZESTRIL) 10 MG tablet TAKE ONE TABLET BY MOUTH ONE TIME DAILY  90 tablet  3  . loratadine (CLARITIN) 10 MG tablet Take 10 mg by mouth daily as needed for allergies.      . meloxicam (MOBIC) 15 MG tablet Take 1 tablet (15 mg total) by mouth daily.  90 tablet  3  . metFORMIN (GLUCOPHAGE) 1000 MG tablet take 1 tablet by mouth twice daily with meal  180 tablet  3  . methocarbamol (ROBAXIN) 500 MG tablet Take 1 tablet (500 mg  total) by mouth every 6  (six) hours as needed.  90 tablet  2  . Multiple Vitamin (MULTIVITAMIN) tablet Take 1 tablet by mouth daily.        . Omega-3 Fatty Acids (FISH OIL) 1200 MG CAPS Take 2 capsules by mouth 2 (two) times daily.      Marland Kitchen omeprazole (PRILOSEC) 20 MG capsule Take 1 capsule (20 mg total) by mouth 2 (two) times daily.  180 capsule  3  . PATADAY 0.2 % SOLN       . pregabalin (LYRICA) 75 MG capsule TAKE ONE CAPSULE BY MOUTH ONE TIME DAILY  90 capsule  3  . tobramycin (TOBREX) 0.3 % ophthalmic solution Place 1 drop into the right eye every 6 (six) hours.       . traZODone (DESYREL) 100 MG tablet 2 tablets at bedtime  60 tablet  5   No current facility-administered medications on file prior to  visit.    BP 140/90  Pulse 96  Temp(Src) 98.4 F (36.9 C) (Oral)  Resp 20  Ht 5\' 2"  (1.575 m)  Wt 194 lb (87.998 kg)  BMI 35.47 kg/m2  SpO2 97%       Review of Systems  Constitutional: Negative.   HENT: Negative for congestion, dental problem, hearing loss, rhinorrhea, sinus pressure, sore throat and tinnitus.   Eyes: Negative for pain, discharge and visual disturbance.  Respiratory: Negative for cough and shortness of breath.   Cardiovascular: Negative for chest pain, palpitations and leg swelling.  Gastrointestinal: Negative for nausea, vomiting, abdominal pain, diarrhea, constipation, blood in stool and abdominal distention.  Genitourinary: Negative for dysuria, urgency, frequency, hematuria, flank pain, vaginal bleeding, vaginal discharge, difficulty urinating, vaginal pain and pelvic pain.  Musculoskeletal: Positive for back pain and gait problem. Negative for arthralgias and joint swelling.  Skin: Negative for rash.  Neurological: Negative for dizziness, syncope, speech difficulty, weakness, numbness and headaches.  Hematological: Negative for adenopathy.  Psychiatric/Behavioral: Negative for behavioral problems, dysphoric mood and agitation. The patient is not nervous/anxious.        Objective:   Physical Exam  Constitutional: She is oriented to person, place, and time. She appears well-developed and well-nourished.  HENT:  Head: Normocephalic.  Right Ear: External ear normal.  Left Ear: External ear normal.  Mouth/Throat: Oropharynx is clear and moist.  Eyes: Conjunctivae and EOM are normal. Pupils are equal, round, and reactive to light.  Neck: Normal range of motion. Neck supple. No thyromegaly present.  Cardiovascular: Normal rate, regular rhythm, normal heart sounds and intact distal pulses.   Pulmonary/Chest: Effort normal and breath sounds normal.  Abdominal: Soft. Bowel sounds are normal. She exhibits no mass. There is no tenderness.  Musculoskeletal:  Normal range of motion.  Full range of motion, right hip Negative straight leg test Able to stand from a sitting position and ambulate without difficulty or discomfort   Lymphadenopathy:    She has no cervical adenopathy.  Neurological: She is alert and oriented to person, place, and time.  Skin: Skin is warm and dry. No rash noted.  Psychiatric: She has a normal mood and affect. Her behavior is normal.          Assessment & Plan:   Intermittent right hip and groin pain.  Probable referred pain from multilevel DJD of the spine.  Options discussed, although fairly comfortable at present she characteristically does well in the morning, but symptoms intensified throughout the day and will treat with Depo-Medrol and observed  Hypertension  Osteoarthritis  Status post surgery for thoracic spinal stenosis.  We'll continue analgesics and muscle relaxers as needed

## 2013-09-27 NOTE — Patient Instructions (Signed)
Please check your hemoglobin A1c every 3 months  Limit your sodium (Salt) intake Call or return to clinic prn if these symptoms worsen or fail to improve as anticipated.

## 2013-09-27 NOTE — Progress Notes (Signed)
Pre visit review using our clinic review tool, if applicable. No additional management support is needed unless otherwise documented below in the visit note. 

## 2013-10-02 ENCOUNTER — Telehealth: Payer: Self-pay | Admitting: Internal Medicine

## 2013-10-02 DIAGNOSIS — R1031 Right lower quadrant pain: Secondary | ICD-10-CM

## 2013-10-02 DIAGNOSIS — M25551 Pain in right hip: Secondary | ICD-10-CM

## 2013-10-02 DIAGNOSIS — M545 Low back pain, unspecified: Secondary | ICD-10-CM

## 2013-10-02 NOTE — Telephone Encounter (Addendum)
Pt is no better from 09-27-13 visit. Pt was given cortisone inj on 09-27-13  Pt would like to know should she get rx for cortisone pills. [facility] [address] Pt would like donna to return her call

## 2013-10-03 NOTE — Telephone Encounter (Signed)
Please advise 

## 2013-10-04 NOTE — Telephone Encounter (Signed)
Spoke to pt, told her Dr. Kirtland BouchardK did not answer me and he is out till Monday. Asked pt if her pain is any better? Pt stated she is not having intense pain like she was but still having pain. Asked pt if taking muscle relaxer. Pt stated yes and it is helping. Told pt to continue medications as prescribed and using heating pad to area and call if any increase in pain or dysuria or frequency with urination and I will get back to her on Monday. Pt verbalized understanding.

## 2013-10-08 NOTE — Telephone Encounter (Signed)
Spoke to pt c/o constant pain right lower back, hip and down into groin, pain no better. Please advise

## 2013-10-08 NOTE — Telephone Encounter (Signed)
Please call and check on present status

## 2013-10-08 NOTE — Telephone Encounter (Signed)
Dr. K, please see message and advise. 

## 2013-10-08 NOTE — Telephone Encounter (Signed)
Spoke to pt told her Dr. Kirtland BouchardK is ordering MRI Lumbar spine, order put in and our referral coordinator will be contacting you regarding an appointment. Pt verbalized understanding.

## 2013-10-08 NOTE — Telephone Encounter (Signed)
Please schedule lumbar MRI.

## 2013-10-13 ENCOUNTER — Ambulatory Visit
Admission: RE | Admit: 2013-10-13 | Discharge: 2013-10-13 | Disposition: A | Discharge status: Home / Self Care | Payer: Medicare Other | Source: Ambulatory Visit | Attending: Internal Medicine | Admitting: Internal Medicine

## 2013-10-13 DIAGNOSIS — M545 Low back pain, unspecified: Secondary | ICD-10-CM

## 2013-10-13 DIAGNOSIS — R1031 Right lower quadrant pain: Secondary | ICD-10-CM

## 2013-10-13 DIAGNOSIS — M25551 Pain in right hip: Secondary | ICD-10-CM

## 2013-10-18 ENCOUNTER — Telehealth: Payer: Self-pay | Admitting: Internal Medicine

## 2013-10-18 NOTE — Telephone Encounter (Signed)
Pt is needing new rx HYDROcodone-acetaminophen (NORCO/VICODIN) 5-325 MG per tablet, please call when available for pick up. ° °

## 2013-10-19 MED ORDER — HYDROCODONE-ACETAMINOPHEN 5-325 MG PO TABS: ORAL_TABLET | ORAL | Status: DC

## 2013-10-19 NOTE — Telephone Encounter (Signed)
Pt notified Rx ready for pickup. Rx printed and signed.  

## 2013-11-08 ENCOUNTER — Other Ambulatory Visit: Payer: Self-pay | Admitting: Internal Medicine

## 2013-11-09 ENCOUNTER — Other Ambulatory Visit: Payer: Self-pay | Admitting: Internal Medicine

## 2013-11-27 ENCOUNTER — Telehealth: Payer: Self-pay | Admitting: Internal Medicine

## 2013-11-27 MED ORDER — HYDROCODONE-ACETAMINOPHEN 5-325 MG PO TABS: ORAL_TABLET | ORAL | Status: DC

## 2013-11-27 NOTE — Telephone Encounter (Signed)
Pt request refill HYDROcodone-acetaminophen (NORCO/VICODIN) 5-325 MG per tablet °

## 2013-11-27 NOTE — Telephone Encounter (Signed)
Pt notified Rx ready for pickup. Rx printed and signed.  

## 2013-12-02 ENCOUNTER — Other Ambulatory Visit: Payer: Self-pay | Admitting: Internal Medicine

## 2013-12-19 ENCOUNTER — Ambulatory Visit: Payer: Medicare Other | Admitting: Internal Medicine

## 2013-12-20 ENCOUNTER — Ambulatory Visit: Payer: Medicare Other | Admitting: Internal Medicine

## 2013-12-27 ENCOUNTER — Ambulatory Visit (INDEPENDENT_AMBULATORY_CARE_PROVIDER_SITE_OTHER): Payer: Medicare Other | Admitting: Internal Medicine

## 2013-12-27 ENCOUNTER — Ambulatory Visit (INDEPENDENT_AMBULATORY_CARE_PROVIDER_SITE_OTHER): Payer: Medicare Other | Admitting: *Deleted

## 2013-12-27 ENCOUNTER — Encounter: Payer: Self-pay | Admitting: Internal Medicine

## 2013-12-27 ENCOUNTER — Encounter: Payer: Self-pay | Admitting: *Deleted

## 2013-12-27 DIAGNOSIS — E119 Type 2 diabetes mellitus without complications: Secondary | ICD-10-CM

## 2013-12-27 DIAGNOSIS — I10 Essential (primary) hypertension: Secondary | ICD-10-CM

## 2013-12-27 DIAGNOSIS — Z23 Encounter for immunization: Secondary | ICD-10-CM

## 2013-12-27 DIAGNOSIS — M48062 Spinal stenosis, lumbar region with neurogenic claudication: Secondary | ICD-10-CM

## 2013-12-27 DIAGNOSIS — M4806 Spinal stenosis, lumbar region: Secondary | ICD-10-CM

## 2013-12-27 LAB — HEMOGLOBIN A1C: Hgb A1c MFr Bld: 6.5 % (ref 4.6–6.5)

## 2013-12-27 MED ORDER — HYDROCODONE-ACETAMINOPHEN 5-325 MG PO TABS: ORAL_TABLET | ORAL | Status: DC

## 2013-12-27 NOTE — Patient Instructions (Signed)
Limit your sodium (Salt) intake  You need to lose weight.  Consider a lower calorie diet and regular exercise.  Return in 6 months for follow-up   

## 2013-12-27 NOTE — Progress Notes (Signed)
Pre visit review using our clinic review tool, if applicable. No additional management support is needed unless otherwise documented below in the visit note. 

## 2013-12-27 NOTE — Progress Notes (Signed)
Subjective:    Patient ID: Katie Booker, female    DOB: 04/01/1945, 68 y.o.   MRN: [id number]  HPI 68 year old patient who is seen today in follow-up.  She has type 2 diabetes which has been well controlled on oral medication.  Last hemoglobin A1c 6.9. She has chronic low back pain.  Since her last visit here, she did have a epidural that had benefit for only 1 week.  She remains on hydrocodone 4 times daily.  She is also on muscle relaxers She has dyslipidemia and hypertension, which have been stable. She has had some situational stress due to the poor health of her 67 year old mother  She is followed closely by ophthalmology due to macular degeneration  Past Medical History  Diagnosis Date  . HYPERLIPIDEMIA 09/18/2009    takes Lipitor daily  . HYPOTHYROIDISM 09/18/2009  . OSTEOARTHRITIS 09/18/2009  . Obesity   . Hemorrhoids   . Anal fissure   . Anxiety     takes Xanax prn sleep and anxiety  . DEPRESSION 09/18/2009    takes Celexa daily  . HYPERTENSION 09/18/2009    takes Atenolol daily and Lisinopril  . Histoplasmosis     hx of  . Shortness of breath     with exertion  . OSA (obstructive sleep apnea)     has a CPAP and sleep study report in epic from 2009  . History of bronchitis     last time around 1990  . Headache(784.0)     sinus HA  . Peripheral neuropathy     takes Lyrica daily  . Joint pain   . Joint swelling   . Chronic back pain     scoliosis/stenosis/spondylosis  . Skin spots, red     right above both elbows;pt states always there  . GERD 09/18/2009    takes Omeprazole daily  . Umbilical hernia   . ANEMIA-NOS 09/18/2009    takes Ferrous Sulfate daily  . History of colon polyps   . Urinary frequency   . Urinary urgency   . Insomnia     takes trazodone and ambien nightly  . Cancer     basal cell left cheek  . DIABETES MELLITUS, TYPE II 09/18/2009    takes Amaryl daily and Metformin    History   Social History  . Marital Status: Divorced   Spouse Name: N/A    Number of Children: N/A  . Years of Education: N/A   Occupational History  . retired    Social History Main Topics  . Smoking status: Former Smoker -- 1.00 packs/day for 20 years    Types: Cigarettes    Quit date: 02/23/2004  . Smokeless tobacco: Never Used     Comment: quit 2007  . Alcohol Use: Yes     Comment: rare  . Drug Use: No  . Sexual Activity: No   Other Topics Concern  . Not on file   Social History Narrative    Past Surgical History  Procedure Laterality Date  . Tubal fulgaration  1991  . Colonoscopy  2007  . Esophagogastroduodenoscopy  2007  . Hemorrhoid surgery  03/23/2011    Procedure: HEMORRHOIDECTOMY;  Surgeon: Clovis Puhomas A. Cornett, MD;  Location: [facility];  Service: General;  Laterality: N/A;  lateral internal sphincterotomy and hemorrhoidectomy  . Sphincterotomy    . Thoracic discectomy N/A 12/15/2012    Procedure: Thoracic ten-eleven Thoracic laminectomy;  Surgeon: Barnett AbuHenry Elsner, MD;  Location: [facility];  Service: Neurosurgery;  Laterality: N/A;  Thoracic ten-eleven Thoracic laminectomy    Family History  Problem Relation Age of Onset  . Diabetes Father   . COPD Father   . Cancer Maternal Grandfather     stomach    Allergies  Allergen Reactions  . Hydromorphone Hcl     Makes crazy    Current Outpatient Prescriptions on File Prior to Visit  Medication Sig Dispense Refill  . acetaminophen (TYLENOL) 650 MG CR tablet Take 650 mg by mouth every 8 (eight) hours as needed for pain.    Marland Kitchen. ALPRAZolam (XANAX) 0.5 MG tablet TAKE ONE TABLET BY MOUTH TWICE DAILY 60 tablet 5  . atenolol (TENORMIN) 25 MG tablet Take 1 tablet (25 mg total) by mouth daily. 90 tablet 3  . atorvastatin (LIPITOR) 40 MG tablet     . beta carotene w/minerals (OCUVITE) tablet Take 1 tablet by mouth daily.    . Calcium Carbonate-Vit D-Min (GNP CALCIUM 600 PLUS D/MINERAL) 600-400 MG-UNIT TABS Take 1 tablet by mouth 2 (two) times daily.      .  Carboxymethylcellulose Sodium (LUBRICANT EYE DROPS OP) Apply 2 drops to eye 4 (four) times daily as needed.    . citalopram (CELEXA) 40 MG tablet Take 1 tablet (40 mg total) by mouth daily. 90 tablet 3  . gemfibrozil (LOPID) 600 MG tablet Take one tablet by mouth twice daily before meals 180 tablet 3  . glimepiride (AMARYL) 2 MG tablet TAKE ONE-HALF TABLET BY MOUTH DAILY 45 tablet 3  . glucose blood (ONE TOUCH ULTRA TEST) test strip 1 each by Other route daily. Use as instructed 100 each 4  . HYDROcodone-acetaminophen (NORCO/VICODIN) 5-325 MG per tablet 1 by mouth every 6 hours as needed for pain **DO NOT EXCEED 4 GM OF TYLENOL IN 24 HOURS** 120 tablet 0  . Lancets (ONETOUCH ULTRASOFT) lancets Use daily 100 each 6  . levothyroxine (SYNTHROID, LEVOTHROID) 150 MCG tablet Take one tablet by mouth one time daily 90 tablet 3  . lisinopril (PRINIVIL,ZESTRIL) 10 MG tablet TAKE ONE TABLET BY MOUTH ONE TIME DAILY 90 tablet 3  . loratadine (CLARITIN) 10 MG tablet Take 10 mg by mouth daily as needed for allergies.    . meloxicam (MOBIC) 15 MG tablet Take 1 tablet (15 mg total) by mouth daily. 90 tablet 3  . metFORMIN (GLUCOPHAGE) 1000 MG tablet take 1 tablet by mouth twice daily with meal 180 tablet 3  . methocarbamol (ROBAXIN) 500 MG tablet Take 1 tablet (500 mg  total) by mouth every 6  (six) hours as needed. 90 tablet 1  . Multiple Vitamin (MULTIVITAMIN) tablet Take 1 tablet by mouth daily.      . Omega-3 Fatty Acids (FISH OIL) 1200 MG CAPS Take 2 capsules by mouth 2 (two) times daily.    Marland Kitchen. omeprazole (PRILOSEC) 20 MG capsule Take 1 capsule (20 mg total) by mouth 2 (two) times daily. 180 capsule 3  . PATADAY 0.2 % SOLN     . pregabalin (LYRICA) 75 MG capsule TAKE ONE CAPSULE BY MOUTH ONE TIME DAILY 90 capsule 3  . tobramycin (TOBREX) 0.3 % ophthalmic solution Place 1 drop into the right eye every 6 (six) hours.     . traZODone (DESYREL) 100 MG tablet TAKE TWO TABLET BY MOUTH EVERY AT BEDTIME 60 tablet 2     No current facility-administered medications on file prior to visit.    BP 116/70 mmHg  Pulse 79  Temp(Src) 98.1 F (36.7 C) (Oral)  Resp 20  Ht 5\' 2"  (1.575 m)  Wt 198 lb (89.812 kg)  BMI 36.21 kg/m2  SpO2 97%      Review of Systems  Constitutional: Negative.   HENT: Negative for congestion, dental problem, hearing loss, rhinorrhea, sinus pressure, sore throat and tinnitus.   Eyes: Negative for pain, discharge and visual disturbance.  Respiratory: Negative for cough and shortness of breath.   Cardiovascular: Negative for chest pain, palpitations and leg swelling.  Gastrointestinal: Negative for nausea, vomiting, abdominal pain, diarrhea, constipation, blood in stool and abdominal distention.  Genitourinary: Negative for dysuria, urgency, frequency, hematuria, flank pain, vaginal bleeding, vaginal discharge, difficulty urinating, vaginal pain and pelvic pain.  Musculoskeletal: Positive for back pain. Negative for joint swelling, arthralgias and gait problem.  Skin: Negative for rash.  Neurological: Negative for dizziness, syncope, speech difficulty, weakness, numbness and headaches.  Hematological: Negative for adenopathy.  Psychiatric/Behavioral: Negative for behavioral problems, dysphoric mood and agitation. The patient is nervous/anxious.        Objective:   Physical Exam  Constitutional: She is oriented to person, place, and time. She appears well-developed and well-nourished.  Overweight.  Blood pressure low normal  HENT:  Head: Normocephalic.  Right Ear: External ear normal.  Left Ear: External ear normal.  Mouth/Throat: Oropharynx is clear and moist.  subconjunctival bleeding right lateral eye  Eyes: Conjunctivae and EOM are normal. Pupils are equal, round, and reactive to light.  Neck: Normal range of motion. Neck supple. No thyromegaly present.  Cardiovascular: Normal rate, regular rhythm, normal heart sounds and intact distal pulses.   Pulmonary/Chest:  Effort normal and breath sounds normal.  Abdominal: Soft. Bowel sounds are normal. She exhibits no mass. There is no tenderness.  Musculoskeletal: Normal range of motion.  Lymphadenopathy:    She has no cervical adenopathy.  Neurological: She is alert and oriented to person, place, and time.  Skin: Skin is warm and dry. No rash noted.  Psychiatric: She has a normal mood and affect. Her behavior is normal.          Assessment & Plan:   Diabetes mellitus type 2.  Will check a hemoglobin A1c Chronic low back pain.  Will check a urine drug screen Hypertension, stable Dyslipidemia  Recheck 6 months or as needed Low-salt diet recommended Weight loss encouraged

## 2013-12-28 ENCOUNTER — Ambulatory Visit: Payer: Medicare Other | Admitting: Internal Medicine

## 2014-01-16 ENCOUNTER — Encounter: Payer: Self-pay | Admitting: Internal Medicine

## 2014-01-18 ENCOUNTER — Other Ambulatory Visit: Payer: Self-pay | Admitting: Internal Medicine

## 2014-01-23 ENCOUNTER — Other Ambulatory Visit: Payer: Self-pay | Admitting: Internal Medicine

## 2014-01-23 MED ORDER — HYDROCODONE-ACETAMINOPHEN 5-325 MG PO TABS: ORAL_TABLET | ORAL | Status: DC

## 2014-01-23 NOTE — Telephone Encounter (Signed)
Pt request refill HYDROcodone-acetaminophen (NORCO/VICODIN) 5-325 MG per tablet °

## 2014-01-23 NOTE — Telephone Encounter (Signed)
Called and spoke with pt and pt is aware.  

## 2014-02-01 ENCOUNTER — Other Ambulatory Visit: Payer: Self-pay | Admitting: Internal Medicine

## 2014-02-05 ENCOUNTER — Other Ambulatory Visit: Payer: Self-pay | Admitting: Internal Medicine

## 2014-02-22 HISTORY — PX: CARPAL TUNNEL RELEASE: SHX101

## 2014-02-25 ENCOUNTER — Telehealth: Payer: Self-pay | Admitting: Internal Medicine

## 2014-02-25 NOTE — Telephone Encounter (Signed)
Pt request refill HYDROcodone-acetaminophen (NORCO/VICODIN) 5-325 MG per tablet °

## 2014-02-26 MED ORDER — HYDROCODONE-ACETAMINOPHEN 5-325 MG PO TABS: ORAL_TABLET | ORAL | Status: DC

## 2014-02-26 NOTE — Telephone Encounter (Signed)
Pt notified Rx ready for pickup. Rx printed and signed.  

## 2014-03-04 DIAGNOSIS — H3531 Nonexudative age-related macular degeneration: Secondary | ICD-10-CM | POA: Diagnosis not present

## 2014-03-04 DIAGNOSIS — H3532 Exudative age-related macular degeneration: Secondary | ICD-10-CM | POA: Diagnosis not present

## 2014-03-12 DIAGNOSIS — H3532 Exudative age-related macular degeneration: Secondary | ICD-10-CM | POA: Diagnosis not present

## 2014-03-16 ENCOUNTER — Other Ambulatory Visit: Payer: Self-pay | Admitting: Internal Medicine

## 2014-03-25 ENCOUNTER — Telehealth: Payer: Self-pay | Admitting: Internal Medicine

## 2014-03-25 NOTE — Telephone Encounter (Signed)
Pt would like to [facility] to [facility] mail order  trazodone 100 mg #180 and alprazoam 0.5 mg #180 sent to [facility]. Pt is not due for med until 04-05-14

## 2014-03-26 MED ORDER — ALPRAZOLAM 0.5 MG PO TABS: 0.5000 mg | ORAL_TABLET | Freq: Two times a day (BID) | ORAL | Status: DC

## 2014-03-26 MED ORDER — TRAZODONE HCL 100 MG PO TABS: 200.0000 mg | ORAL_TABLET | Freq: Every day | ORAL | Status: DC

## 2014-03-26 NOTE — Telephone Encounter (Signed)
Rx's faxed to [facility].

## 2014-03-27 ENCOUNTER — Telehealth: Payer: Self-pay | Admitting: Internal Medicine

## 2014-03-27 NOTE — Telephone Encounter (Signed)
Patient need re-fill on HYDROcodone-acetaminophen (NORCO/VICODIN) 5-325 MG per tablet °

## 2014-03-29 MED ORDER — HYDROCODONE-ACETAMINOPHEN 5-325 MG PO TABS: ORAL_TABLET | ORAL | Status: DC

## 2014-03-29 NOTE — Telephone Encounter (Signed)
Pt notified Rx ready for pickup. Rx printed and signed.  

## 2014-04-01 ENCOUNTER — Telehealth: Payer: Self-pay

## 2014-04-01 MED ORDER — PREGABALIN 75 MG PO CAPS: ORAL_CAPSULE | ORAL | Status: DC

## 2014-04-01 NOTE — Telephone Encounter (Signed)
Rx request for Lyrica 75 mg capsule #90  Pharm:  [facility]  Pls advise.

## 2014-04-01 NOTE — Telephone Encounter (Signed)
Rx faxed to [facility]. 

## 2014-04-02 DIAGNOSIS — H3531 Nonexudative age-related macular degeneration: Secondary | ICD-10-CM | POA: Diagnosis not present

## 2014-04-02 DIAGNOSIS — H3532 Exudative age-related macular degeneration: Secondary | ICD-10-CM | POA: Diagnosis not present

## 2014-04-11 DIAGNOSIS — H3532 Exudative age-related macular degeneration: Secondary | ICD-10-CM | POA: Diagnosis not present

## 2014-04-25 ENCOUNTER — Telehealth: Payer: Self-pay | Admitting: Internal Medicine

## 2014-04-25 NOTE — Telephone Encounter (Signed)
Pt needs new rx hydrocodone. [id number]  [address] [place]. Pt is still using [facility] for mail order

## 2014-04-26 MED ORDER — HYDROCODONE-ACETAMINOPHEN 5-325 MG PO TABS: ORAL_TABLET | ORAL | Status: DC

## 2014-04-26 NOTE — Telephone Encounter (Signed)
Pt notified Rx ready for pickup. Rx printed and signed.  

## 2014-04-29 ENCOUNTER — Other Ambulatory Visit: Payer: Self-pay | Admitting: Internal Medicine

## 2014-05-08 DIAGNOSIS — H3531 Nonexudative age-related macular degeneration: Secondary | ICD-10-CM | POA: Diagnosis not present

## 2014-05-08 DIAGNOSIS — H3532 Exudative age-related macular degeneration: Secondary | ICD-10-CM | POA: Diagnosis not present

## 2014-05-15 DIAGNOSIS — H3532 Exudative age-related macular degeneration: Secondary | ICD-10-CM | POA: Diagnosis not present

## 2014-05-23 ENCOUNTER — Other Ambulatory Visit: Payer: Self-pay | Admitting: Neurological Surgery

## 2014-05-23 DIAGNOSIS — M5090 Cervical disc disorder, unspecified, unspecified cervical region: Secondary | ICD-10-CM

## 2014-05-27 ENCOUNTER — Telehealth: Payer: Self-pay | Admitting: Internal Medicine

## 2014-05-27 MED ORDER — HYDROCODONE-ACETAMINOPHEN 5-325 MG PO TABS: ORAL_TABLET | ORAL | Status: DC

## 2014-05-27 NOTE — Telephone Encounter (Signed)
Pt needs new rx hydrocodone. Pt needs med by tomorrow

## 2014-05-27 NOTE — Telephone Encounter (Signed)
Pt notified Rx ready for pickup. Rx printed and signed.  

## 2014-06-01 ENCOUNTER — Other Ambulatory Visit: Payer: Self-pay | Admitting: Internal Medicine

## 2014-06-06 ENCOUNTER — Telehealth: Payer: Self-pay | Admitting: Internal Medicine

## 2014-06-06 ENCOUNTER — Other Ambulatory Visit: Payer: Self-pay | Admitting: Internal Medicine

## 2014-06-06 NOTE — Telephone Encounter (Signed)
Patient called requesting PA for robaxin.  I called to submit the PA and was advised patient's plan requires her to try and fail 9 formulary alternatives.  PA was submitted anyway and I was given the ref# [id number].

## 2014-06-07 NOTE — Telephone Encounter (Signed)
PA for methocarbamol was denied.  [facility], the preferred list of formulary alternatives.  The representative told me yesterday the patient must try and fail all 9 on the preferred list.  I will give you the print-out for review.

## 2014-06-09 ENCOUNTER — Other Ambulatory Visit: Payer: Self-pay

## 2014-06-10 MED ORDER — TIZANIDINE HCL 4 MG PO TABS: 4.0000 mg | ORAL_TABLET | Freq: Four times a day (QID) | ORAL | Status: DC | PRN

## 2014-06-10 NOTE — Telephone Encounter (Signed)
Insurance company gave list to pt as alternate: zanaflex ( tizanidine)  (tier 2)  Will try that if ok w/ dr Colin Muldersk  [facility]

## 2014-06-10 NOTE — Telephone Encounter (Signed)
Left detailed message Rx for Zanaflex to replace Methocarbamol [facility].

## 2014-06-14 DIAGNOSIS — H3531 Nonexudative age-related macular degeneration: Secondary | ICD-10-CM | POA: Diagnosis not present

## 2014-06-14 DIAGNOSIS — H3532 Exudative age-related macular degeneration: Secondary | ICD-10-CM | POA: Diagnosis not present

## 2014-06-14 DIAGNOSIS — H43813 Vitreous degeneration, bilateral: Secondary | ICD-10-CM | POA: Diagnosis not present

## 2014-06-14 DIAGNOSIS — H35372 Puckering of macula, left eye: Secondary | ICD-10-CM | POA: Diagnosis not present

## 2014-06-24 DIAGNOSIS — H3532 Exudative age-related macular degeneration: Secondary | ICD-10-CM | POA: Diagnosis not present

## 2014-06-26 ENCOUNTER — Telehealth: Payer: Self-pay | Admitting: Internal Medicine

## 2014-06-26 MED ORDER — HYDROCODONE-ACETAMINOPHEN 5-325 MG PO TABS: ORAL_TABLET | ORAL | Status: DC

## 2014-06-26 NOTE — Telephone Encounter (Signed)
Pt needs new rx hydrocodone °

## 2014-06-26 NOTE — Telephone Encounter (Signed)
Pt notified Rx ready for pickup. Rx printed and signed.  

## 2014-06-27 ENCOUNTER — Ambulatory Visit
Admission: RE | Admit: 2014-06-27 | Discharge: 2014-06-27 | Disposition: A | Discharge status: Home / Self Care | Payer: Medicare Other | Source: Ambulatory Visit | Attending: Neurological Surgery | Admitting: Neurological Surgery

## 2014-06-27 DIAGNOSIS — M5090 Cervical disc disorder, unspecified, unspecified cervical region: Secondary | ICD-10-CM

## 2014-07-11 ENCOUNTER — Other Ambulatory Visit: Payer: Self-pay | Admitting: Internal Medicine

## 2014-07-15 ENCOUNTER — Telehealth: Payer: Self-pay

## 2014-07-15 NOTE — Telephone Encounter (Signed)
Called and left a message for pt to return call about scheduling mammogram.

## 2014-07-20 ENCOUNTER — Ambulatory Visit
Admission: RE | Admit: 2014-07-20 | Discharge: 2014-07-20 | Disposition: A | Discharge status: Home / Self Care | Payer: Medicare Other | Source: Ambulatory Visit | Attending: Neurological Surgery | Admitting: Neurological Surgery

## 2014-07-20 DIAGNOSIS — M5023 Other cervical disc displacement, cervicothoracic region: Secondary | ICD-10-CM | POA: Diagnosis not present

## 2014-07-20 DIAGNOSIS — M5022 Other cervical disc displacement, mid-cervical region: Secondary | ICD-10-CM | POA: Diagnosis not present

## 2014-07-20 DIAGNOSIS — M5021 Other cervical disc displacement,  high cervical region: Secondary | ICD-10-CM | POA: Diagnosis not present

## 2014-07-20 DIAGNOSIS — M4712 Other spondylosis with myelopathy, cervical region: Secondary | ICD-10-CM | POA: Diagnosis not present

## 2014-07-23 DIAGNOSIS — H35372 Puckering of macula, left eye: Secondary | ICD-10-CM | POA: Diagnosis not present

## 2014-07-23 DIAGNOSIS — H43813 Vitreous degeneration, bilateral: Secondary | ICD-10-CM | POA: Diagnosis not present

## 2014-07-23 DIAGNOSIS — H3531 Nonexudative age-related macular degeneration: Secondary | ICD-10-CM | POA: Diagnosis not present

## 2014-07-23 DIAGNOSIS — H3532 Exudative age-related macular degeneration: Secondary | ICD-10-CM | POA: Diagnosis not present

## 2014-07-24 DIAGNOSIS — G959 Disease of spinal cord, unspecified: Secondary | ICD-10-CM | POA: Diagnosis not present

## 2014-07-26 ENCOUNTER — Telehealth: Payer: Self-pay | Admitting: Internal Medicine

## 2014-07-26 MED ORDER — HYDROCODONE-ACETAMINOPHEN 5-325 MG PO TABS: ORAL_TABLET | ORAL | Status: DC

## 2014-07-26 NOTE — Telephone Encounter (Signed)
Pt needs new rx hydrocodone asap. Pt does not have enough to last until monday

## 2014-07-26 NOTE — Telephone Encounter (Signed)
Pt notified Rx ready for pickup. Rx printed and signed.  

## 2014-07-29 ENCOUNTER — Other Ambulatory Visit: Payer: Self-pay | Admitting: Neurological Surgery

## 2014-08-01 ENCOUNTER — Other Ambulatory Visit: Payer: Self-pay | Admitting: Internal Medicine

## 2014-08-12 ENCOUNTER — Other Ambulatory Visit: Payer: Self-pay | Admitting: Internal Medicine

## 2014-08-20 DIAGNOSIS — H3531 Nonexudative age-related macular degeneration: Secondary | ICD-10-CM | POA: Diagnosis not present

## 2014-08-20 DIAGNOSIS — H3532 Exudative age-related macular degeneration: Secondary | ICD-10-CM | POA: Diagnosis not present

## 2014-08-20 DIAGNOSIS — H43813 Vitreous degeneration, bilateral: Secondary | ICD-10-CM | POA: Diagnosis not present

## 2014-08-20 DIAGNOSIS — H35372 Puckering of macula, left eye: Secondary | ICD-10-CM | POA: Diagnosis not present

## 2014-08-22 ENCOUNTER — Encounter (HOSPITAL_COMMUNITY)
Admission: RE | Admit: 2014-08-22 | Discharge: 2014-08-22 | Disposition: A | Discharge status: Home / Self Care | Payer: Medicare Other | Source: Ambulatory Visit | Attending: Neurological Surgery | Admitting: Neurological Surgery

## 2014-08-22 ENCOUNTER — Encounter (HOSPITAL_COMMUNITY): Payer: Self-pay

## 2014-08-22 DIAGNOSIS — Z01812 Encounter for preprocedural laboratory examination: Secondary | ICD-10-CM | POA: Diagnosis not present

## 2014-08-22 DIAGNOSIS — Z01810 Encounter for preprocedural cardiovascular examination: Secondary | ICD-10-CM | POA: Diagnosis not present

## 2014-08-22 DIAGNOSIS — E119 Type 2 diabetes mellitus without complications: Secondary | ICD-10-CM | POA: Diagnosis not present

## 2014-08-22 LAB — BASIC METABOLIC PANEL
ANION GAP: 9 (ref 5–15)
BUN: 14 mg/dL (ref 6–20)
CO2: 26 mmol/L (ref 22–32)
Calcium: 9.5 mg/dL (ref 8.9–10.3)
Chloride: 103 mmol/L (ref 101–111)
Creatinine, Ser: 0.79 mg/dL (ref 0.44–1.00)
GFR calc non Af Amer: >60 mL/min (ref >60)
GLUCOSE: 88 mg/dL (ref 65–99)
Potassium: 4.5 mmol/L (ref 3.5–5.1)
SODIUM: 138 mmol/L (ref 135–145)

## 2014-08-22 LAB — GLUCOSE, CAPILLARY: Glucose-Capillary: 86 mg/dL (ref 65–99)

## 2014-08-22 LAB — CBC
HCT: 37.1 % (ref 36.0–46.0)
Hemoglobin: 11.9 g/dL — ABNORMAL LOW (ref 12.0–15.0)
MCH: 24.6 pg — ABNORMAL LOW (ref 26.0–34.0)
MCHC: 32.1 g/dL (ref 30.0–36.0)
MCV: 76.7 fL — ABNORMAL LOW (ref 78.0–100.0)
Platelets: 192 10*3/uL (ref 150–400)
RBC: 4.84 MIL/uL (ref 3.87–5.11)
RDW: 16.7 % — ABNORMAL HIGH (ref 11.5–15.5)
WBC: 7.7 10*3/uL (ref 4.0–10.5)

## 2014-08-22 LAB — SURGICAL PCR SCREEN
MRSA, PCR: NEGATIVE
Staphylococcus aureus: NEGATIVE

## 2014-08-22 NOTE — Pre-Procedure Instructions (Signed)
    Junious SilkSharon A Olds  08/22/2014      [facility] [place] [facility] [id number] [place] [place] [id number] Phone: [phone] Fax: [phone]  [facility] [place] - [address] [place] [address] Phone: [phone] Fax: [phone]    Your procedure is scheduled on 09/02/14.  [facility] at 530 A.M.  Call this number if you have problems the morning of surgery:  [phone]   Remember:  Do not eat food or drink liquids after midnight.  Take these medicines the morning of surgery with A SIP OF WATER tylenol,xanax,atenolol,celexa,hydrocodone,synthroid,prilosec   Do not wear jewelry, make-up or nail polish.  Do not wear lotions, powders, or perfumes.  You may wear deodorant.  Do not shave 48 hours prior to surgery.  Men may shave face and neck.  Do not bring valuables to the hospital.  [facility] is not responsible for any belongings or valuables.  Contacts, dentures or bridgework may not be worn into surgery.  Leave your suitcase in the car.  After surgery it may be brought to your room.  For patients admitted to the hospital, discharge time will be determined by your treatment team.  Patients discharged the day of surgery will not be allowed to drive home.   Name and phone number of your driver:    Special instructions:    Please read over the following fact sheets that you were given. Pain Booklet, Coughing and Deep Breathing, MRSA Information and Surgical Site Infection Prevention

## 2014-08-23 LAB — HEMOGLOBIN A1C
HEMOGLOBIN A1C: 7 % — AB (ref 4.8–5.6)
MEAN PLASMA GLUCOSE: 154 mg/dL

## 2014-08-27 ENCOUNTER — Telehealth: Payer: Self-pay | Admitting: Internal Medicine

## 2014-08-27 MED ORDER — HYDROCODONE-ACETAMINOPHEN 5-325 MG PO TABS: ORAL_TABLET | ORAL | Status: DC

## 2014-08-27 NOTE — Telephone Encounter (Signed)
Pt notified Rx ready for pickup. Rx printed and signed.  

## 2014-08-27 NOTE — Telephone Encounter (Signed)
Pt needs new rx hydrocodone °

## 2014-09-02 ENCOUNTER — Inpatient Hospital Stay (HOSPITAL_COMMUNITY): Payer: Medicare Other

## 2014-09-02 ENCOUNTER — Observation Stay (HOSPITAL_COMMUNITY)
Admission: RE | Admit: 2014-09-02 | Discharge: 2014-09-03 | Disposition: A | Discharge status: Home / Self Care | Payer: Medicare Other | Source: Ambulatory Visit | Attending: Neurological Surgery | Admitting: Neurological Surgery

## 2014-09-02 ENCOUNTER — Encounter (HOSPITAL_COMMUNITY): Payer: Self-pay | Admitting: *Deleted

## 2014-09-02 ENCOUNTER — Other Ambulatory Visit: Payer: Self-pay | Admitting: *Deleted

## 2014-09-02 ENCOUNTER — Encounter (HOSPITAL_COMMUNITY)
Admission: RE | Disposition: A | Discharge status: Home / Self Care | Payer: Self-pay | Source: Ambulatory Visit | Attending: Neurological Surgery

## 2014-09-02 ENCOUNTER — Inpatient Hospital Stay (HOSPITAL_COMMUNITY): Payer: Medicare Other | Admitting: Anesthesiology

## 2014-09-02 DIAGNOSIS — M4712 Other spondylosis with myelopathy, cervical region: Secondary | ICD-10-CM | POA: Insufficient documentation

## 2014-09-02 DIAGNOSIS — M549 Dorsalgia, unspecified: Secondary | ICD-10-CM | POA: Diagnosis not present

## 2014-09-02 DIAGNOSIS — G4730 Sleep apnea, unspecified: Secondary | ICD-10-CM | POA: Insufficient documentation

## 2014-09-02 DIAGNOSIS — E039 Hypothyroidism, unspecified: Secondary | ICD-10-CM | POA: Insufficient documentation

## 2014-09-02 DIAGNOSIS — Z6835 Body mass index (BMI) 35.0-35.9, adult: Secondary | ICD-10-CM | POA: Insufficient documentation

## 2014-09-02 DIAGNOSIS — K219 Gastro-esophageal reflux disease without esophagitis: Secondary | ICD-10-CM | POA: Diagnosis not present

## 2014-09-02 DIAGNOSIS — Z5189 Encounter for other specified aftercare: Secondary | ICD-10-CM | POA: Diagnosis not present

## 2014-09-02 DIAGNOSIS — M4710 Other spondylosis with myelopathy, site unspecified: Secondary | ICD-10-CM | POA: Diagnosis present

## 2014-09-02 DIAGNOSIS — Z885 Allergy status to narcotic agent status: Secondary | ICD-10-CM | POA: Diagnosis not present

## 2014-09-02 DIAGNOSIS — M4722 Other spondylosis with radiculopathy, cervical region: Principal | ICD-10-CM | POA: Insufficient documentation

## 2014-09-02 DIAGNOSIS — M5000 Cervical disc disorder with myelopathy, unspecified cervical region: Secondary | ICD-10-CM | POA: Diagnosis not present

## 2014-09-02 DIAGNOSIS — F329 Major depressive disorder, single episode, unspecified: Secondary | ICD-10-CM | POA: Diagnosis not present

## 2014-09-02 DIAGNOSIS — F419 Anxiety disorder, unspecified: Secondary | ICD-10-CM | POA: Insufficient documentation

## 2014-09-02 DIAGNOSIS — E119 Type 2 diabetes mellitus without complications: Secondary | ICD-10-CM | POA: Insufficient documentation

## 2014-09-02 DIAGNOSIS — G709 Myoneural disorder, unspecified: Secondary | ICD-10-CM | POA: Insufficient documentation

## 2014-09-02 DIAGNOSIS — I10 Essential (primary) hypertension: Secondary | ICD-10-CM | POA: Diagnosis not present

## 2014-09-02 DIAGNOSIS — Z87891 Personal history of nicotine dependence: Secondary | ICD-10-CM | POA: Diagnosis not present

## 2014-09-02 DIAGNOSIS — Z419 Encounter for procedure for purposes other than remedying health state, unspecified: Secondary | ICD-10-CM

## 2014-09-02 HISTORY — PX: ANTERIOR CERVICAL DECOMP/DISCECTOMY FUSION: SHX1161

## 2014-09-02 LAB — GLUCOSE, CAPILLARY
GLUCOSE-CAPILLARY: 215 mg/dL — AB (ref 65–99)
Glucose-Capillary: 90 mg/dL (ref 65–99)
Glucose-Capillary: 105 mg/dL — ABNORMAL HIGH (ref 65–99)
Glucose-Capillary: 162 mg/dL — ABNORMAL HIGH (ref 65–99)
Glucose-Capillary: 186 mg/dL — ABNORMAL HIGH (ref 65–99)

## 2014-09-02 SURGERY — ANTERIOR CERVICAL DECOMPRESSION/DISCECTOMY FUSION 1 LEVEL
Anesthesia: General

## 2014-09-02 MED ORDER — SODIUM CHLORIDE 0.9 % IJ SOLN
INTRAMUSCULAR | Status: AC
  Filled 2014-09-02: qty 10

## 2014-09-02 MED ORDER — ROCURONIUM BROMIDE 100 MG/10ML IV SOLN
INTRAVENOUS | Status: DC | PRN
  Administered 2014-09-02: 40 mg via INTRAVENOUS

## 2014-09-02 MED ORDER — LORATADINE 10 MG PO TABS: 10.0000 mg | ORAL_TABLET | Freq: Every day | ORAL | Status: DC | PRN

## 2014-09-02 MED ORDER — ALPRAZOLAM 0.5 MG PO TABS
0.5000 mg | ORAL_TABLET | Freq: Two times a day (BID) | ORAL | Status: DC
  Administered 2014-09-02 – 2014-09-03 (×2): 0.5 mg via ORAL
  Filled 2014-09-02 (×2): qty 1

## 2014-09-02 MED ORDER — LEVOTHYROXINE SODIUM 150 MCG PO TABS
150.0000 ug | ORAL_TABLET | Freq: Every day | ORAL | Status: DC
  Administered 2014-09-03: 150 ug via ORAL
  Filled 2014-09-02 (×2): qty 1

## 2014-09-02 MED ORDER — LACTATED RINGERS IV SOLN
INTRAVENOUS | Status: DC | PRN
  Administered 2014-09-02 (×2): via INTRAVENOUS

## 2014-09-02 MED ORDER — HYDROCODONE-ACETAMINOPHEN 5-325 MG PO TABS: 1.0000 | ORAL_TABLET | ORAL | Status: DC | PRN

## 2014-09-02 MED ORDER — NEOSTIGMINE METHYLSULFATE 10 MG/10ML IV SOLN
INTRAVENOUS | Status: DC | PRN
  Administered 2014-09-02: 3 mg via INTRAVENOUS

## 2014-09-02 MED ORDER — FENTANYL CITRATE (PF) 100 MCG/2ML IJ SOLN
INTRAMUSCULAR | Status: DC | PRN
  Administered 2014-09-02: 50 ug via INTRAVENOUS
  Administered 2014-09-02: 100 ug via INTRAVENOUS

## 2014-09-02 MED ORDER — SUCCINYLCHOLINE CHLORIDE 20 MG/ML IJ SOLN
INTRAMUSCULAR | Status: AC
  Filled 2014-09-02: qty 1

## 2014-09-02 MED ORDER — PHENYLEPHRINE HCL 10 MG/ML IJ SOLN
10.0000 mg | INTRAMUSCULAR | Status: DC | PRN
  Administered 2014-09-02: 20 ug/min via INTRAVENOUS

## 2014-09-02 MED ORDER — PROPOFOL 10 MG/ML IV BOLUS
INTRAVENOUS | Status: DC | PRN
  Administered 2014-09-02: 150 mg via INTRAVENOUS

## 2014-09-02 MED ORDER — PROMETHAZINE HCL 25 MG/ML IJ SOLN: 6.2500 mg | INTRAMUSCULAR | Status: DC | PRN

## 2014-09-02 MED ORDER — KETOROLAC TROMETHAMINE 30 MG/ML IJ SOLN: 30.0000 mg | Freq: Once | INTRAMUSCULAR | Status: DC | PRN

## 2014-09-02 MED ORDER — PROPOFOL 10 MG/ML IV BOLUS
INTRAVENOUS | Status: AC
  Filled 2014-09-02: qty 20

## 2014-09-02 MED ORDER — METHOCARBAMOL 500 MG PO TABS: 500.0000 mg | ORAL_TABLET | Freq: Four times a day (QID) | ORAL | Status: DC | PRN

## 2014-09-02 MED ORDER — METFORMIN HCL 500 MG PO TABS
1000.0000 mg | ORAL_TABLET | Freq: Two times a day (BID) | ORAL | Status: DC
  Administered 2014-09-02 – 2014-09-03 (×2): 1000 mg via ORAL
  Filled 2014-09-02 (×4): qty 2

## 2014-09-02 MED ORDER — TRAZODONE HCL 100 MG PO TABS
200.0000 mg | ORAL_TABLET | Freq: Every day | ORAL | Status: DC
  Administered 2014-09-02: 200 mg via ORAL
  Filled 2014-09-02 (×2): qty 2

## 2014-09-02 MED ORDER — FENTANYL CITRATE (PF) 100 MCG/2ML IJ SOLN
25.0000 ug | INTRAMUSCULAR | Status: DC | PRN
  Administered 2014-09-02: 50 ug via INTRAVENOUS

## 2014-09-02 MED ORDER — SODIUM CHLORIDE 0.9 % IV SOLN: 250.0000 mL | INTRAVENOUS | Status: DC

## 2014-09-02 MED ORDER — POLYETHYL GLYCOL-PROPYL GLYCOL 0.4-0.3 % OP SOLN: Freq: Four times a day (QID) | OPHTHALMIC | Status: DC | PRN

## 2014-09-02 MED ORDER — ATENOLOL 25 MG PO TABS
25.0000 mg | ORAL_TABLET | Freq: Every day | ORAL | Status: DC
  Administered 2014-09-03: 25 mg via ORAL
  Filled 2014-09-02: qty 1

## 2014-09-02 MED ORDER — LIDOCAINE HCL (CARDIAC) 20 MG/ML IV SOLN
INTRAVENOUS | Status: AC
  Filled 2014-09-02: qty 10

## 2014-09-02 MED ORDER — BUPIVACAINE HCL (PF) 0.5 % IJ SOLN
INTRAMUSCULAR | Status: DC | PRN
  Administered 2014-09-02: 3.5 mL

## 2014-09-02 MED ORDER — LIDOCAINE HCL (CARDIAC) 20 MG/ML IV SOLN
INTRAVENOUS | Status: DC | PRN
  Administered 2014-09-02: 60 mg via INTRAVENOUS

## 2014-09-02 MED ORDER — FENTANYL CITRATE (PF) 250 MCG/5ML IJ SOLN
INTRAMUSCULAR | Status: AC
  Filled 2014-09-02: qty 5

## 2014-09-02 MED ORDER — GLYCOPYRROLATE 0.2 MG/ML IJ SOLN
INTRAMUSCULAR | Status: DC | PRN
  Administered 2014-09-02: 0.4 mg via INTRAVENOUS

## 2014-09-02 MED ORDER — POLYVINYL ALCOHOL 1.4 % OP SOLN
1.0000 [drp] | Freq: Three times a day (TID) | OPHTHALMIC | Status: DC | PRN
  Filled 2014-09-02: qty 15

## 2014-09-02 MED ORDER — BISACODYL 10 MG RE SUPP: 10.0000 mg | Freq: Every day | RECTAL | Status: DC | PRN

## 2014-09-02 MED ORDER — TOBRAMYCIN 0.3 % OP SOLN
1.0000 [drp] | Freq: Four times a day (QID) | OPHTHALMIC | Status: DC
Start: 2014-09-02 — End: 2014-09-02

## 2014-09-02 MED ORDER — NEOSTIGMINE METHYLSULFATE 10 MG/10ML IV SOLN
INTRAVENOUS | Status: AC
  Filled 2014-09-02: qty 1

## 2014-09-02 MED ORDER — GLYCOPYRROLATE 0.2 MG/ML IJ SOLN
INTRAMUSCULAR | Status: AC
  Filled 2014-09-02: qty 2

## 2014-09-02 MED ORDER — SENNA 8.6 MG PO TABS
1.0000 | ORAL_TABLET | Freq: Two times a day (BID) | ORAL | Status: DC
  Administered 2014-09-02 – 2014-09-03 (×3): 8.6 mg via ORAL
  Filled 2014-09-02 (×5): qty 1

## 2014-09-02 MED ORDER — PANTOPRAZOLE SODIUM 40 MG PO TBEC
40.0000 mg | DELAYED_RELEASE_TABLET | Freq: Every day | ORAL | Status: DC
  Administered 2014-09-03: 40 mg via ORAL
  Filled 2014-09-02: qty 1

## 2014-09-02 MED ORDER — OXYCODONE HCL 5 MG/5ML PO SOLN: 5.0000 mg | Freq: Once | ORAL | Status: DC | PRN

## 2014-09-02 MED ORDER — HEMOSTATIC AGENTS (NO CHARGE) OPTIME
TOPICAL | Status: DC | PRN
  Administered 2014-09-02: 1 via TOPICAL

## 2014-09-02 MED ORDER — CEFAZOLIN SODIUM-DEXTROSE 2-3 GM-% IV SOLR
INTRAVENOUS | Status: AC
  Filled 2014-09-02: qty 50

## 2014-09-02 MED ORDER — SODIUM CHLORIDE 0.9 % IR SOLN
Status: DC | PRN
  Administered 2014-09-02: 09:00:00

## 2014-09-02 MED ORDER — PHENOL 1.4 % MT LIQD: 1.0000 | OROMUCOSAL | Status: DC | PRN

## 2014-09-02 MED ORDER — OXYCODONE HCL 5 MG PO TABS: 5.0000 mg | ORAL_TABLET | Freq: Once | ORAL | Status: DC | PRN

## 2014-09-02 MED ORDER — GEMFIBROZIL 600 MG PO TABS
600.0000 mg | ORAL_TABLET | Freq: Two times a day (BID) | ORAL | Status: DC
  Administered 2014-09-02 – 2014-09-03 (×2): 600 mg via ORAL
  Filled 2014-09-02 (×4): qty 1

## 2014-09-02 MED ORDER — DOCUSATE SODIUM 100 MG PO CAPS
100.0000 mg | ORAL_CAPSULE | Freq: Two times a day (BID) | ORAL | Status: DC
  Administered 2014-09-02 – 2014-09-03 (×3): 100 mg via ORAL
  Filled 2014-09-02 (×3): qty 1

## 2014-09-02 MED ORDER — GLIMEPIRIDE 1 MG PO TABS
1.0000 mg | ORAL_TABLET | Freq: Every day | ORAL | Status: DC
  Administered 2014-09-03: 1 mg via ORAL
  Filled 2014-09-02 (×2): qty 1

## 2014-09-02 MED ORDER — ATORVASTATIN CALCIUM 40 MG PO TABS
40.0000 mg | ORAL_TABLET | Freq: Every day | ORAL | Status: DC
  Administered 2014-09-02: 40 mg via ORAL
  Filled 2014-09-02 (×2): qty 1

## 2014-09-02 MED ORDER — MENTHOL 3 MG MT LOZG: 1.0000 | LOZENGE | OROMUCOSAL | Status: DC | PRN

## 2014-09-02 MED ORDER — ROCURONIUM BROMIDE 50 MG/5ML IV SOLN
INTRAVENOUS | Status: AC
  Filled 2014-09-02: qty 1

## 2014-09-02 MED ORDER — METHOCARBAMOL 1000 MG/10ML IJ SOLN: 500.0000 mg | Freq: Four times a day (QID) | INTRAVENOUS | Status: DC | PRN

## 2014-09-02 MED ORDER — ONDANSETRON HCL 4 MG/2ML IJ SOLN
INTRAMUSCULAR | Status: AC
  Filled 2014-09-02: qty 2

## 2014-09-02 MED ORDER — CEFAZOLIN SODIUM-DEXTROSE 2-3 GM-% IV SOLR
2.0000 g | INTRAVENOUS | Status: AC
  Administered 2014-09-02: 2 g via INTRAVENOUS

## 2014-09-02 MED ORDER — CEFAZOLIN SODIUM 1-5 GM-% IV SOLN
1.0000 g | Freq: Three times a day (TID) | INTRAVENOUS | Status: AC
  Administered 2014-09-02 – 2014-09-03 (×2): 1 g via INTRAVENOUS
  Filled 2014-09-02 (×2): qty 50

## 2014-09-02 MED ORDER — PREGABALIN 50 MG PO CAPS
75.0000 mg | ORAL_CAPSULE | Freq: Two times a day (BID) | ORAL | Status: DC
  Administered 2014-09-02 – 2014-09-03 (×2): 75 mg via ORAL
  Filled 2014-09-02 (×4): qty 1

## 2014-09-02 MED ORDER — DEXAMETHASONE SODIUM PHOSPHATE 10 MG/ML IJ SOLN
INTRAMUSCULAR | Status: DC | PRN
  Administered 2014-09-02: 8 mg via INTRAVENOUS

## 2014-09-02 MED ORDER — ONDANSETRON HCL 4 MG/2ML IJ SOLN: 4.0000 mg | INTRAMUSCULAR | Status: DC | PRN

## 2014-09-02 MED ORDER — 0.9 % SODIUM CHLORIDE (POUR BTL) OPTIME
TOPICAL | Status: DC | PRN
  Administered 2014-09-02: 1000 mL

## 2014-09-02 MED ORDER — ACETAMINOPHEN 650 MG RE SUPP: 650.0000 mg | RECTAL | Status: DC | PRN

## 2014-09-02 MED ORDER — LISINOPRIL 10 MG PO TABS
10.0000 mg | ORAL_TABLET | Freq: Every day | ORAL | Status: DC
  Administered 2014-09-03: 10 mg via ORAL
  Filled 2014-09-02 (×2): qty 1

## 2014-09-02 MED ORDER — OXYCODONE-ACETAMINOPHEN 5-325 MG PO TABS
1.0000 | ORAL_TABLET | ORAL | Status: DC | PRN
  Administered 2014-09-02 – 2014-09-03 (×4): 2 via ORAL
  Filled 2014-09-02 (×4): qty 2

## 2014-09-02 MED ORDER — SODIUM CHLORIDE 0.9 % IJ SOLN: 3.0000 mL | INTRAMUSCULAR | Status: DC | PRN

## 2014-09-02 MED ORDER — POLYETHYLENE GLYCOL 3350 17 G PO PACK
17.0000 g | PACK | Freq: Every day | ORAL | Status: DC | PRN
  Filled 2014-09-02: qty 1

## 2014-09-02 MED ORDER — LIDOCAINE-EPINEPHRINE 1 %-1:100000 IJ SOLN
INTRAMUSCULAR | Status: DC | PRN
  Administered 2014-09-02: 3.5 mL

## 2014-09-02 MED ORDER — EPHEDRINE SULFATE 50 MG/ML IJ SOLN
INTRAMUSCULAR | Status: AC
  Filled 2014-09-02: qty 1

## 2014-09-02 MED ORDER — ALPRAZOLAM 0.5 MG PO TABS: 0.5000 mg | ORAL_TABLET | Freq: Two times a day (BID) | ORAL | Status: DC

## 2014-09-02 MED ORDER — SODIUM CHLORIDE 0.9 % IJ SOLN
3.0000 mL | Freq: Two times a day (BID) | INTRAMUSCULAR | Status: DC
  Administered 2014-09-02: 3 mL via INTRAVENOUS

## 2014-09-02 MED ORDER — FENTANYL CITRATE (PF) 100 MCG/2ML IJ SOLN
INTRAMUSCULAR | Status: AC
  Filled 2014-09-02: qty 2

## 2014-09-02 MED ORDER — ACETAMINOPHEN 325 MG PO TABS: 650.0000 mg | ORAL_TABLET | ORAL | Status: DC | PRN

## 2014-09-02 MED ORDER — ONDANSETRON HCL 4 MG/2ML IJ SOLN
INTRAMUSCULAR | Status: DC | PRN
  Administered 2014-09-02: 4 mg via INTRAVENOUS

## 2014-09-02 MED ORDER — THROMBIN 5000 UNITS EX SOLR
CUTANEOUS | Status: DC | PRN
  Administered 2014-09-02 (×2): 5000 [IU] via TOPICAL

## 2014-09-02 MED ORDER — CITALOPRAM HYDROBROMIDE 40 MG PO TABS
40.0000 mg | ORAL_TABLET | Freq: Every day | ORAL | Status: DC
  Administered 2014-09-03: 40 mg via ORAL
  Filled 2014-09-02: qty 1

## 2014-09-02 SURGICAL SUPPLY — 57 items
ALLOGRAFT TRIAD LORDOTIC CC (Bone Implant) ×2 IMPLANT
BAG DECANTER FOR FLEXI CONT (MISCELLANEOUS) ×2 IMPLANT
BIT DRILL NEURO 2X3.1 SFT TUCH (MISCELLANEOUS) ×2 IMPLANT
BIT DRILL POWER (BIT) ×1 IMPLANT
BNDG GAUZE ELAST 4 BULKY (GAUZE/BANDAGES/DRESSINGS) IMPLANT
BUR BARREL STRAIGHT FLUTE 4.0 (BURR) ×4 IMPLANT
CANISTER SUCT 3000ML PPV (MISCELLANEOUS) ×2 IMPLANT
CONT SPEC 4OZ CLIKSEAL STRL BL (MISCELLANEOUS) ×4 IMPLANT
DECANTER SPIKE VIAL GLASS SM (MISCELLANEOUS) ×2 IMPLANT
DERMABOND ADHESIVE PROPEN (GAUZE/BANDAGES/DRESSINGS) ×1
DERMABOND ADVANCED (GAUZE/BANDAGES/DRESSINGS)
DERMABOND ADVANCED .7 DNX12 (GAUZE/BANDAGES/DRESSINGS) IMPLANT
DERMABOND ADVANCED .7 DNX6 (GAUZE/BANDAGES/DRESSINGS) ×1 IMPLANT
DRAPE LAPAROTOMY 100X72 PEDS (DRAPES) ×2 IMPLANT
DRAPE MICROSCOPE LEICA (MISCELLANEOUS) ×2 IMPLANT
DRAPE POUCH INSTRU U-SHP 10X18 (DRAPES) ×2 IMPLANT
DRILL BIT POWER (BIT) ×1
DRILL NEURO 2X3.1 SOFT TOUCH (MISCELLANEOUS) ×4
DRSG OPSITE 4X5.5 SM (GAUZE/BANDAGES/DRESSINGS) IMPLANT
DRSG TELFA 3X8 NADH (GAUZE/BANDAGES/DRESSINGS) ×2 IMPLANT
DURAPREP 6ML APPLICATOR 50/CS (WOUND CARE) ×2 IMPLANT
ELECT REM PT RETURN 9FT ADLT (ELECTROSURGICAL) ×2
ELECTRODE REM PT RTRN 9FT ADLT (ELECTROSURGICAL) ×1 IMPLANT
GAUZE SPONGE 4X4 16PLY XRAY LF (GAUZE/BANDAGES/DRESSINGS) IMPLANT
GLOVE BIO SURGEON STRL SZ7.5 (GLOVE) IMPLANT
GLOVE BIOGEL PI IND STRL 7.5 (GLOVE) IMPLANT
GLOVE BIOGEL PI IND STRL 8.5 (GLOVE) ×1 IMPLANT
GLOVE BIOGEL PI INDICATOR 7.5 (GLOVE)
GLOVE BIOGEL PI INDICATOR 8.5 (GLOVE) ×1
GLOVE ECLIPSE 8.5 STRL (GLOVE) ×2 IMPLANT
GLOVE EXAM NITRILE LRG STRL (GLOVE) IMPLANT
GLOVE EXAM NITRILE MD LF STRL (GLOVE) IMPLANT
GLOVE EXAM NITRILE XL STR (GLOVE) IMPLANT
GLOVE EXAM NITRILE XS STR PU (GLOVE) IMPLANT
GOWN STRL REUS W/ TWL LRG LVL3 (GOWN DISPOSABLE) IMPLANT
GOWN STRL REUS W/ TWL XL LVL3 (GOWN DISPOSABLE) ×1 IMPLANT
GOWN STRL REUS W/TWL 2XL LVL3 (GOWN DISPOSABLE) ×2 IMPLANT
GOWN STRL REUS W/TWL LRG LVL3 (GOWN DISPOSABLE)
GOWN STRL REUS W/TWL XL LVL3 (GOWN DISPOSABLE) ×1
HALTER HD/CHIN CERV TRACTION D (MISCELLANEOUS) ×2 IMPLANT
KIT BASIN OR (CUSTOM PROCEDURE TRAY) ×4 IMPLANT
KIT ROOM TURNOVER OR (KITS) ×2 IMPLANT
NEEDLE HYPO 22GX1.5 SAFETY (NEEDLE) ×2 IMPLANT
NEEDLE SPNL 22GX3.5 QUINCKE BK (NEEDLE) ×2 IMPLANT
NS IRRIG 1000ML POUR BTL (IV SOLUTION) ×2 IMPLANT
PACK LAMINECTOMY NEURO (CUSTOM PROCEDURE TRAY) ×2 IMPLANT
PAD ARMBOARD 7.5X6 YLW CONV (MISCELLANEOUS) ×6 IMPLANT
PLATE ARCHON 1-LEVEL 20MM (Plate) ×2 IMPLANT
RUBBERBAND STERILE (MISCELLANEOUS) ×6 IMPLANT
SCREW ARCHON SELFTAP 4.0X13 (Screw) ×8 IMPLANT
SPONGE INTESTINAL PEANUT (DISPOSABLE) ×4 IMPLANT
SPONGE SURGIFOAM ABS GEL SZ50 (HEMOSTASIS) ×4 IMPLANT
SUT VIC AB 3-0 SH 8-18 (SUTURE) ×4 IMPLANT
SYR 20ML ECCENTRIC (SYRINGE) ×2 IMPLANT
TOWEL OR 17X24 6PK STRL BLUE (TOWEL DISPOSABLE) ×2 IMPLANT
TOWEL OR 17X26 10 PK STRL BLUE (TOWEL DISPOSABLE) ×2 IMPLANT
WATER STERILE IRR 1000ML POUR (IV SOLUTION) ×2 IMPLANT

## 2014-09-02 NOTE — Progress Notes (Signed)
Patient ID: Katie Booker, female   DOB: 15-May-1945, 69 y.o.   MRN: [id number] Vital signs stable Motor function is intact Patient feels comfortable She is swallowing without difficulties Has ambulated and voided

## 2014-09-02 NOTE — Op Note (Signed)
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 Asst.: Minda DittoF Jenkins M.D. Indications: Patient is a 69 year old individual who's had progressive numbness in her hands and loss of fine dexterity in the upper extremities in addition to a generalized loss of strength and stamina. She has evidence of cord compression at C5-C6. She's been advised regarding surgical decompression.   Procedure: The patient was brought to the operating room placed on the table in supine position. After the smooth induction of general endotracheal anesthesia neck was placed in 5 pounds of halter traction and prepped with alcohol and DuraPrep. After sterile draping and appropriate timeout procedure a transverse incision was created in the left side of the neck and carried down to the platysma. The plane between the sternocleidomastoid and strap muscles dissected bluntly until the prevertebral space was reached. The first identifiable disc space was noted to be C3-4 on a localizing radiograph. The dissection was then undertaken in the longus coli muscle to allow placement of a self-retaining Caspar type retractor.  The anterior longitudinal ligament was opened at C5-6 and ventral osteophytes were removed with a Leksell rongeur and Kerrison punch. Interspace was cleared of significant quantity of the degenerated disc material in the region of the posterior longitudinal ligament was removed. Dissection was carried out using a high-speed drill and 3-0 Karlin curettes. Uncinate processes were drilled down and removed and osteophytes from the inferior margin of the body of C5 were removed with a Kerrison 2 mm  gold punch. After the central canal and lateral recesses were well decompressed hemostasis was achieved with the bipolar cautery and some small pledgets of Gelfoam soaked in thrombin that were later irrigated away.  A 6 mm cortical ring allograft was then opened and placed into the interspace at C5-C6.   Next the retractor was removed and a 20 mm nuvasive plate was placed over the vertebral bodies and secured with 13 mm variable angle screws. A final localizing radiograph identified the position of the surgical construct. The stasis was achieved in the soft tissues and then the platysma was closed with 3-0 Vicryl in an interrupted fashion and 3-0 Vicryl was used in the subcuticular tissue. Blood loss was estimated at 20 mL

## 2014-09-02 NOTE — Anesthesia Preprocedure Evaluation (Addendum)
Anesthesia Evaluation  Patient identified by MRN, date of birth, ID band Patient awake    Reviewed: Allergy & Precautions, NPO status , Patient's Chart, lab work & pertinent test results, reviewed documented beta blocker date and time   Airway Mallampati: II  TM Distance: >3 FB Neck ROM: Full    Dental   Pulmonary sleep apnea , former smoker,  breath sounds clear to auscultation        Cardiovascular hypertension, Pt. on medications and Pt. on home beta blockers Rhythm:Regular Rate:Normal  NL EF on 2014 TTE. No significant valvular abnormalities   Neuro/Psych Anxiety Depression  Neuromuscular disease    GI/Hepatic Neg liver ROS, GERD-  ,  Endo/Other  diabetes, Type 2Hypothyroidism Morbid obesity  Renal/GU negative Renal ROS     Musculoskeletal  (+) Arthritis -,   Abdominal   Peds  Hematology  (+) anemia ,   Anesthesia Other Findings   Reproductive/Obstetrics                            Anesthesia Physical Anesthesia Plan  ASA: III  Anesthesia Plan: General   Post-op Pain Management:    Induction: Intravenous  Airway Management Planned: Oral ETT  Additional Equipment:   Intra-op Plan:   Post-operative Plan: Extubation in OR  Informed Consent: I have reviewed the patients History and Physical, chart, labs and discussed the procedure including the risks, benefits and alternatives for the proposed anesthesia with the patient or authorized representative who has indicated his/her understanding and acceptance.   Dental advisory given  Plan Discussed with: CRNA  Anesthesia Plan Comments:         Anesthesia Quick Evaluation

## 2014-09-02 NOTE — Transfer of Care (Signed)
Immediate Anesthesia Transfer of Care Note  Patient: Katie Booker  Procedure(s) Performed: Procedure(s) with comments: Cervical five- six  Anterior cervical decompression fusion (N/A) - C5-6 Anterior cervical decompression/diskectomy/fusion  Patient Location: PACU  Anesthesia Type:General  Level of Consciousness: awake and confused  Airway & Oxygen Therapy: Patient Spontanous Breathing and Patient connected to face mask oxygen  Post-op Assessment: Report given to RN, Post -op Vital signs reviewed and stable and Patient moving all extremities  Post vital signs: Reviewed and stable  Last Vitals:  Filed Vitals:   09/02/14 0630  BP: 119/68  Pulse: 72  Temp: 37.1 C  Resp: 20    Complications: No apparent anesthesia complications

## 2014-09-02 NOTE — Anesthesia Procedure Notes (Signed)
Procedure Name: Intubation Date/Time: 09/02/2014 8:42 AM Performed by: Izora Gala Pre-anesthesia Checklist: Patient identified, Emergency Drugs available, Suction available and Patient being monitored Patient Re-evaluated:Patient Re-evaluated prior to inductionOxygen Delivery Method: Circle system utilized Preoxygenation: Pre-oxygenation with 100% oxygen Intubation Type: IV induction Ventilation: Mask ventilation without difficulty and Oral airway inserted - appropriate to patient size Laryngoscope Size: Miller and 3 Grade View: Grade II Tube type: Oral Tube size: 7.0 mm Number of attempts: 1 Airway Equipment and Method: Stylet,  LTA kit utilized and Bite block Placement Confirmation: ETT inserted through vocal cords under direct vision,  positive ETCO2 and breath sounds checked- equal and bilateral Secured at: 22 cm Tube secured with: Tape Dental Injury: Teeth and Oropharynx as per pre-operative assessment and Injury to lip

## 2014-09-02 NOTE — Anesthesia Postprocedure Evaluation (Signed)
  Anesthesia Post-op Note  Patient: Katie Booker  Procedure(s) Performed: Procedure(s) with comments: Cervical five- six  Anterior cervical decompression fusion (N/A) - C5-6 Anterior cervical decompression/diskectomy/fusion  Patient Location: PACU  Anesthesia Type:General  Level of Consciousness: awake, alert  and oriented  Airway and Oxygen Therapy: Patient Spontanous Breathing  Post-op Pain: none  Post-op Assessment: Post-op Vital signs reviewed              Post-op Vital Signs: Reviewed  Last Vitals:  Filed Vitals:   09/02/14 1055  BP: 147/77  Pulse:   Temp: 36.7 C  Resp:     Complications: No apparent anesthesia complications

## 2014-09-02 NOTE — H&P (Addendum)
HOPI:                                                   Katie Booker is admitted to hospital today.  She tells me that for some time now she has been having progressively worsening use of her upper extremities.  She notes that she tends to drop things easily.  She has a hard time manipulating fine things like picking up buttons or even using a pencil.  She notes that her penmanship has changed, and she just found that out in filling out the paperwork at the office.  She knows that there has been a change in her ability to remember things also.  Aside from this, she has been finding that her ability to walk and stand on her feet is getting substantially diminished also.    DATA:                                                  Last year in August, she had an MRI of her lumbar spine which demonstrated that her fusion and central canal on the lower lumbar spine was well decompressed, but at L1-2 she had a slight degenerative retrolisthesis with some mild stenosis.  At that time, observation of that condition was advised.  She presents with an MRI of the cervical spine that was performed on 07/20/2014.  This study demonstrates that Katie Booker has an acute disc rupture on top of significant, chronic spondylosis at the level of C5-6 that narrows the AP diameter of her canal to less than 5 mm.  She has overt compression of the spinal cord at this level.  She has bilateral foraminal stenosis there also.  At the adjacent level, C4-5, she has a modest bulge of a disc with some mild bilateral foraminal stenosis and uncinate process hypertrophy.  She also has some mild spondylosis at C6-7, but the clearly worst area of her cervical spine is at C5-6.     REVIEW OF SYSTEMS:                                    Systems review is notable for wearing of glasses, allergies, cataracts and a slight ringing in the ears, nasal congestion, drainage, sinus problems, high blood pressure, high cholesterol, leg pain while walking, shortness  of breath, arm weakness, leg weakness, back pain, leg pain, joint pain and swelling, arthritis, neck pain, bowel incontinence, change in bowel habits since her surgery last year, indigestion, pain with eating, difficulty with memory, anxiety and depression, thyroid disease, diabetes, and anemia.  All noted on the 14-point review sheet.  PAST MEDICAL HISTORY:                                  . Current Medical Conditions:  Her past medical history is significant for some hypertension and sleep apnea.  She also has some arthritis and macular degeneration.  She is also diabetic.  Marland Kitchen Prior Operations:  Previous surgeries include hemorrhoidectomy in January of last year, sphincterotomy at the same time and  colonoscopy back in 2007.  . Medications and Allergies:  Medications include Amaryl, Ambien, atenolol, calcium, Celexa, Claritin, ferrous sulfate, [place] eye drops, Lipitor, lisinopril, Lopid, Lyrica, metformin, omeprazole, Synthroid, trazodone, Ultram and xanax.  FAMILY HISTORY:                                            Family is significant that her mother age 68 living, has osteoporosis and dementia.  Father died at age 68 with diabetes and obesity.  There is a history in the family of obesity, diabetes, parkinsonism, high blood pressure, heart disease, sleep apnea and osteoarthritis.  SOCIAL HISTORY:                                            She does not smoke, drinks alcohol on a social basis.   PHYSICAL EXAMINATION:                    Her exam today reveals that her strength in her deltoids is good to confrontation at 5+.  Her biceps strength is 4+, as is her wrist extensor and grip strength.  Triceps strength is also 4+ and intact.  Her reflexes are symmetrically absent in the biceps, triceps, and brachioradialis.  Patellar reflexes are also trace, as are the Achilles reflexes.   IMPRESSION/PLAN:                             The patient has evidence of significant and advanced cervical  spondylosis with cord compression at the level of C5-6.  I have advised Katie Booker that she should undergo decompression of her cervical spine at the level of C5-6, and we will do this via a one-level anterior decompression and arthrodesis.  she is admitted for this procedure today, as I noted that the difficulties that she is experiencing with her upper extremities and possibly even the deterioration of her gait is likely related to the cord compression that she is experiencing at C5-6.

## 2014-09-02 NOTE — Evaluation (Signed)
Physical Therapy Evaluation Patient Details Name: Katie Booker MRN: [id number] DOB: 02/01/1946 Today's Date: 09/02/2014   History of Present Illness  69 y.o. s/p C5-6 Anterior cervical decompression/diskectomy/fusion. Her past medical history is significant for some hypertension and sleep apnea. She also has some arthritis and macular degeneration.Also history of previous spinal surgeries.  Clinical Impression  Patient demonstrates deficits in functional mobility as indicated below. Will benefit from continued skilled PT to address deficits and maximize function. Will see as indicated and progress as tolerated. Anticipate patient will be safe for d/c home with intermittent supervision. Will continue to educate patient on energy conservation with mobility.    Follow Up Recommendations No PT follow up;Supervision - Intermittent    Equipment Recommendations  None recommended by PT    Recommendations for Other Services       Precautions / Restrictions Precautions Precautions: Cervical;Fall Precaution Comments: educated on cervical precautions Restrictions Weight Bearing Restrictions: No      Mobility  Bed Mobility Overal bed mobility: Needs Assistance Bed Mobility: Rolling;Sidelying to Sit;Sit to Sidelying Rolling: Min guard;Supervision Sidelying to sit: Min guard     Sit to sidelying: Min guard General bed mobility comments: cues for log roll technique.   Transfers Overall transfer level: Needs assistance Equipment used: None Transfers: Sit to/from Stand Sit to Stand: Supervision         General transfer comment: No physical assist needed, performed from chair x2 and from toilet  Ambulation/Gait Ambulation/Gait assistance: Supervision Ambulation Distance (Feet): 180 Feet Assistive device: None Gait Pattern/deviations: Decreased stride length;Narrow base of support Gait velocity: decreased Gait velocity interpretation: Below normal speed for age/gender General  Gait Details: VCs for upright posture and positoning during ambulation. no physical assist required. Cues for breathing during ambulation. one standing rest break secondary to fatigue and SOB.  Stairs            Wheelchair Mobility    Modified Rankin (Stroke Patients Only)       Balance Overall balance assessment: No apparent balance deficits (not formally assessed)                                           Pertinent Vitals/Pain Pain Assessment: 0-10 Pain Score: 3  Pain Location: neck back and RLE Pain Descriptors / Indicators: Aching;Sore Pain Intervention(s): Monitored during session;Repositioned    Home Living Family/patient expects to be discharged to:: Private residence Living Arrangements: Alone Available Help at Discharge: Family;Neighbor;Available PRN/intermittently Type of Home:  (condo) Home Access: Level entry     Home Layout: One level Home Equipment: Walker - 4 wheels;Shower seat - built in      Prior Function Level of Independence: Independent with assistive device(s)               Hand Dominance   Dominant Hand: Right    Extremity/Trunk Assessment   Upper Extremity Assessment: Overall WFL for tasks assessed           Lower Extremity Assessment: Generalized weakness RLE Deficits / Details: history of right knee pain    Cervical / Trunk Assessment:  (increased body habitus)  Communication   Communication: No difficulties  Cognition Arousal/Alertness: Awake/alert Behavior During Therapy: WFL for tasks assessed/performed Overall Cognitive Status: Within Functional Limits for tasks assessed  General Comments General comments (skin integrity, edema, etc.): educated regarding energy conservation    Exercises        Assessment/Plan    PT Assessment Patient needs continued PT services  PT Diagnosis Difficulty walking;Acute pain   PT Problem List Decreased mobility;Decreased  activity tolerance;Pain;Cardiopulmonary status limiting activity  PT Treatment Interventions DME instruction;Gait training;Stair training;Functional mobility training;Therapeutic activities;Therapeutic exercise;Balance training;Patient/family education   PT Goals (Current goals can be found in the Care Plan section) Acute Rehab PT Goals Patient Stated Goal: to be able to walk and not need pain medicine PT Goal Formulation: With patient Time For Goal Achievement: 09/16/14 Potential to Achieve Goals: Good    Frequency Min 5X/week   Barriers to discharge        Co-evaluation               End of Session   Activity Tolerance: Patient tolerated treatment well Patient left: in chair;with call bell/phone within reach Nurse Communication: Mobility status         Time: [id number] PT Time Calculation (min) (ACUTE ONLY): 19 min   Charges:   PT Evaluation $Initial PT Evaluation Tier I: 1 Procedure     PT G CodesFabio Asa:        Teona Vargus J 09/02/2014, 3:47 PM Charlotte Crumbevon Vuk Skillern, PT DPT  [phone]

## 2014-09-02 NOTE — Evaluation (Addendum)
Occupational Therapy Evaluation Patient Details Name: Katie Booker MRN: [id number] DOB: 01/22/1946 Today's Date: 09/02/2014    History of Present Illness 69 y.o. s/p C5-6 Anterior cervical decompression/diskectomy/fusion. Her past medical history is significant for some hypertension and sleep apnea. She also has some arthritis and macular degeneration.   Clinical Impression   Pt s/p above. Pt independent with ADLs, PTA. Feel pt will benefit from acute OT to reinforce precautions and increase independence prior to d/c.    Follow Up Recommendations  No OT follow up;Supervision - Intermittent    Equipment Recommendations  Other (comment) (TBD)    Recommendations for Other Services       Precautions / Restrictions Precautions Precautions: Cervical;Fall Precaution Comments: educated on cervical precautions Restrictions Weight Bearing Restrictions: No      Mobility Bed Mobility Overal bed mobility: Needs Assistance Bed Mobility: Rolling;Sidelying to Sit;Sit to Sidelying Rolling: Min guard;Supervision Sidelying to sit: Min guard     Sit to sidelying: Min guard General bed mobility comments: cues for log roll technique.   Transfers Overall transfer level: Needs assistance   Transfers: Sit to/from Stand Sit to Stand: Min guard         General transfer comment: Min guard for safety    Balance    No LOB in session, however pt using IV pole for ambulation. Reports baseline balance deficits.                                         ADL Overall ADL's : Needs assistance/impaired                 Upper Body Dressing : Set up;Supervision/safety;Sitting   Lower Body Dressing: Min guard;Sit to/from stand   Toilet Transfer: Min guard;Ambulation (bed)           Functional mobility during ADLs: Min guard (pt used IV pole ) General ADL Comments: Discussed incorporating precautions into functional activities. Educated on LB ADL technique.  Educated on safety such as sitting for LB ADLs and recommended someone be with her for shower transfer.      Vision Pt with macular degeneration.   Perception     Praxis      Pertinent Vitals/Pain Pain Assessment: 0-10 Pain Score: 3  Pain Location: neck and lower back Pain Intervention(s): Monitored during session;Repositioned     Hand Dominance Right   Extremity/Trunk Assessment Upper Extremity Assessment Upper Extremity Assessment: Overall WFL for tasks assessed   Lower Extremity Assessment Lower Extremity Assessment: Defer to PT evaluation       Communication Communication Communication: No difficulties   Cognition Arousal/Alertness: Lethargic;Suspect due to medications Behavior [facility] Therapy[facility] tasks assessed/performed Overall Cognitive Status: Within Functional Limits for tasks assessed                     General Comments       Exercises       Shoulder Instructions      Home Living Family/patient expects to be discharged to:: Private residence Living Arrangements: Alone Available Help at Discharge: Family;Neighbor;Available PRN/intermittently Type of Home:  (condo) Home Access: Level entry     Home Layout: One level     Bathroom Shower/Tub: Producer, television/film/videoWalk-in shower   Bathroom Toilet: Standard     Home Equipment: Environmental consultantWalker - 4 wheels;Shower seat - built in          Prior Functioning/Environment  Level of Independence: Independent with assistive device(s)             OT Diagnosis: Acute pain   OT Problem List: Impaired balance (sitting and/or standing);Decreased knowledge of use of DME or AE;Decreased knowledge of precautions;Pain   OT Treatment/Interventions: Self-care/ADL training;DME and/or AE instruction;Therapeutic activities;Balance training;Patient/family education    OT Goals(Current goals can be found in the care plan section) Acute Rehab OT Goals Patient Stated Goal: not stated OT Goal Formulation: With patient Time For  Goal Achievement: 09/09/14 Potential to Achieve Goals: Good ADL Goals Pt Will Perform Lower Body Dressing: with modified independence;sit to/from stand Pt Will Transfer to Toilet: with modified independence;ambulating;regular height toilet Pt Will Perform Tub/Shower Transfer: Shower transfer;with supervision;ambulating;shower seat Additional ADL Goal #1: Pt will perform bed mobility (log roll technique) at Mod I level.  OT Frequency: Min 2X/week   Barriers to D/C:            Co-evaluation              End of Session Equipment Utilized During Treatment: Gait belt Nurse Communication: Other (comment) (Pt "out of it")  Activity Tolerance: Patient tolerated treatment well;Other (comment) (was lethargic) Patient left: in bed;with call bell/phone within reach; SCDs reapplied   Time: 1230-1248 OT Time Calculation (min): 18 min Charges:  OT General Charges $OT Visit: 1 Procedure OT Evaluation $Initial OT Evaluation Tier I: 1 Procedure G-CodesEarlie Raveling OTR/L [phone] 09/02/2014, 1:05 PM

## 2014-09-03 ENCOUNTER — Encounter (HOSPITAL_COMMUNITY): Payer: Self-pay | Admitting: Neurological Surgery

## 2014-09-03 DIAGNOSIS — E119 Type 2 diabetes mellitus without complications: Secondary | ICD-10-CM | POA: Diagnosis not present

## 2014-09-03 DIAGNOSIS — G709 Myoneural disorder, unspecified: Secondary | ICD-10-CM | POA: Diagnosis not present

## 2014-09-03 DIAGNOSIS — F419 Anxiety disorder, unspecified: Secondary | ICD-10-CM | POA: Diagnosis not present

## 2014-09-03 DIAGNOSIS — Z87891 Personal history of nicotine dependence: Secondary | ICD-10-CM | POA: Diagnosis not present

## 2014-09-03 DIAGNOSIS — E039 Hypothyroidism, unspecified: Secondary | ICD-10-CM | POA: Diagnosis not present

## 2014-09-03 DIAGNOSIS — M4722 Other spondylosis with radiculopathy, cervical region: Secondary | ICD-10-CM | POA: Diagnosis not present

## 2014-09-03 DIAGNOSIS — Z6835 Body mass index (BMI) 35.0-35.9, adult: Secondary | ICD-10-CM | POA: Diagnosis not present

## 2014-09-03 DIAGNOSIS — M4712 Other spondylosis with myelopathy, cervical region: Secondary | ICD-10-CM | POA: Diagnosis not present

## 2014-09-03 DIAGNOSIS — G4730 Sleep apnea, unspecified: Secondary | ICD-10-CM | POA: Diagnosis not present

## 2014-09-03 DIAGNOSIS — F329 Major depressive disorder, single episode, unspecified: Secondary | ICD-10-CM | POA: Diagnosis not present

## 2014-09-03 DIAGNOSIS — Z885 Allergy status to narcotic agent status: Secondary | ICD-10-CM | POA: Diagnosis not present

## 2014-09-03 DIAGNOSIS — I10 Essential (primary) hypertension: Secondary | ICD-10-CM | POA: Diagnosis not present

## 2014-09-03 DIAGNOSIS — K219 Gastro-esophageal reflux disease without esophagitis: Secondary | ICD-10-CM | POA: Diagnosis not present

## 2014-09-03 LAB — GLUCOSE, CAPILLARY
GLUCOSE-CAPILLARY: 109 mg/dL — AB (ref 65–99)
GLUCOSE-CAPILLARY: 101 mg/dL — AB (ref 65–99)

## 2014-09-03 MED ORDER — OXYCODONE-ACETAMINOPHEN 5-325 MG PO TABS: 1.0000 | ORAL_TABLET | ORAL | Status: DC | PRN

## 2014-09-03 NOTE — Progress Notes (Signed)
Occupational Therapy Treatment Patient Details Name: Katie Booker MRN: [id number] DOB: [birth year] Today's Date: 09/03/2014    History of present illness 69 y.o. s/p C5-6 Anterior cervical decompression/diskectomy/fusion. Her past medical history is significant for some hypertension and sleep apnea. She also has some arthritis and macular degeneration.   OT comments  All OT education has been completed and patient has no further acute OT needs. She states the doctor is discharging her home today.  Follow Up Recommendations  No OT follow up;Supervision - Intermittent    Equipment Recommendations  None recommended by OT    Recommendations for Other Services      Precautions / Restrictions Precautions Precautions: Cervical;Fall Precaution Comments: educated on cervical precautions Restrictions Weight Bearing Restrictions: No       Mobility Bed Mobility                  Transfers Overall transfer level: Modified independent Equipment used: None                  Balance                                   ADL Overall ADL's : Modified independent                                     Functional mobility during ADLs: Modified independent General ADL Comments: Discussed incorporating precautions into functional activities. Educated on LB ADL technique. Educated on safety such as sitting for LB ADLs and recommended someone be with her for shower transfer. Educated patient on performance of IADLs and need to utilize daughter and neighbor's assistance with heavy duty tasks. Patient verbalized understanding. Patient is pleased with her R hand function and reports she is able to use it for toilet hygiene.       Vision                     Perception     Praxis      Cognition   Behavior During Therapy: WFL for tasks assessed/performed Overall Cognitive Status: Within Functional Limits for tasks assessed                       Extremity/Trunk Assessment               Exercises     Shoulder Instructions       General Comments      Pertinent Vitals/ Pain       Pain Assessment: 0-10 Pain Score: 3  Pain Location: neck Pain Descriptors / Indicators: Sore Pain Intervention(s): Monitored during session  Home Living                                          Prior Functioning/Environment              Frequency       Progress Toward Goals  OT Goals(current goals can now be found in the care plan section)  Progress towards OT goals: Goals met/education completed, patient discharged from [facility] All goals met and education completed, patient discharged from OT services    Co-evaluation  End of Session     Activity Tolerance Patient tolerated treatment well;Other (comment)   Patient Left in chair;with call bell/phone within reach   Nurse Communication          Time: 1018-1050 OT Time Calculation (min): 32 min  Charges: OT General Charges $OT Visit: 1 Procedure OT Treatments $Self Care/Home Management : 23-37 mins  Yerik Zeringue A 09/03/2014, 11:17 AM

## 2014-09-03 NOTE — Progress Notes (Signed)
OT evaluation addendum    09/02/14 1256  OT Time Calculation  OT Start Time (ACUTE ONLY) 1230  OT Stop Time (ACUTE ONLY) 1248  OT Time Calculation (min) 18 min  OT G-codes **NOT FOR INPATIENT CLASS**  Functional Assessment Tool Used clinical judgment  Functional Limitation Self care  Self Care Current Status (U0454(G8987) CI  Self Care Goal Status (U9811(G8988) CI  OT General Charges  $OT Visit 1 Procedure  OT Evaluation  $Initial OT Evaluation Tier I 1 Procedure    Jenell MillinerLindsey Loraine Bhullar, OTR/L [phone]

## 2014-09-03 NOTE — Progress Notes (Signed)
   09/03/14 1110  OT G-codes **NOT FOR INPATIENT CLASS**  Functional Limitation Self care  Self Care Current Status ([id number](G8987) CI  Self Care Goal Status (U0454(G8988) CI  Self Care Discharge Status [phone](G8989) CI

## 2014-09-03 NOTE — Discharge Summary (Signed)
Physician Discharge Summary  Patient ID: KINSLIE HOVE MRN: [id number] DOB/AGE: 68-Feb-1947 69 y.o.  Admit date: 09/02/2014 Discharge date: 09/03/2014  Admission Diagnoses: Cervical spondylosis with myelopathy  Discharge Diagnoses: Cervical spondylosis with myelopathy Active Problems:   Spondylosis with myelopathy   Cervical spondylosis with myelopathy   Discharged Condition: good  Hospital Course: She was admitted to undergo anterior cervical decompression arthrodesis C5-C6. She tolerated surgery well.  Consults: None  Significant Diagnostic Studies: None  Treatments: surgery: Anterior cervical decompression C5-6 arthrodesis with structural allograft and anterior plate fixation Z3-0  Discharge Exam: Blood pressure 112/57, pulse 95, temperature 98.4 F (36.9 C), temperature source Oral, resp. rate 16, weight 87.771 kg (193 lb 8 oz), SpO2 92 %. Motor function is intact in upper extremities. Station and gait are stable.  Disposition: Discharge home  Discharge Instructions    Call MD for:  redness, tenderness, or signs of infection (pain, swelling, redness, odor or green/yellow discharge around incision site)    Complete by:  As directed      Call MD for:  severe uncontrolled pain    Complete by:  As directed      Call MD for:  temperature >100.4    Complete by:  As directed      Diet - low sodium heart healthy    Complete by:  As directed      Discharge instructions    Complete by:  As directed   Okay to shower. Do not apply salves or appointments to incision. No heavy lifting with the upper extremities greater than 15 pounds. May resume driving when not requiring pain medication and patient feels comfortable with doing so.     Increase activity slowly    Complete by:  As directed             Medication List    TAKE these medications        acetaminophen 650 MG CR tablet  Commonly known as:  TYLENOL  Take 650 mg by mouth every 8 (eight) hours as needed for pain.      ALPRAZolam 0.5 MG tablet  Commonly known as:  XANAX  Take 1 tablet (0.5 mg total) by mouth 2 (two) times daily.     atenolol 25 MG tablet  Commonly known as:  TENORMIN  Take 1 tablet by mouth  daily     atorvastatin 40 MG tablet  Commonly known as:  LIPITOR  Take 1 tablet by mouth once daily     beta carotene w/minerals tablet  Take 1 tablet by mouth daily.     carboxymethylcellulose 0.5 % Soln  Commonly known as:  REFRESH PLUS  Place 1 drop into both eyes 3 (three) times daily as needed (dry eyes).     citalopram 40 MG tablet  Commonly known as:  CELEXA  Take 1 tablet by mouth  daily     Fish Oil 1200 MG Caps  Take 2 capsules by mouth 2 (two) times daily.     gemfibrozil 600 MG tablet  Commonly known as:  LOPID  Take one tablet by mouth  twice daily before meals     glimepiride 2 MG tablet  Commonly known as:  AMARYL  Take one-half tablet by  mouth daily     glucose blood test strip  Commonly known as:  ONE TOUCH ULTRA TEST  1 each by Other route daily. Use as instructed     GNP CALCIUM 600 PLUS D/MINERAL 600-400 MG-UNIT Tabs  Take 1  tablet by mouth 2 (two) times daily.     HYDROcodone-acetaminophen 5-325 MG per tablet  Commonly known as:  NORCO/VICODIN  1 by mouth every 6 hours as needed for pain **DO NOT EXCEED 4 GM OF TYLENOL IN 24 HOURS**     levothyroxine 150 MCG tablet  Commonly known as:  SYNTHROID, LEVOTHROID  Take 1 tablet by mouth once daily     lisinopril 10 MG tablet  Commonly known as:  PRINIVIL,ZESTRIL  Take 1 tablet by mouth once daily     loratadine 10 MG tablet  Commonly known as:  CLARITIN  Take 10 mg by mouth daily as needed for allergies.     LUBRICANT EYE DROPS OP  Apply 2 drops to eye 4 (four) times daily as needed.     meloxicam 15 MG tablet  Commonly known as:  MOBIC  Take 1 tablet by mouth  daily     metFORMIN 1000 MG tablet  Commonly known as:  GLUCOPHAGE  take 1 tablet by mouth  twice daily with meal      methocarbamol 500 MG tablet  Commonly known as:  ROBAXIN  Take 500 mg by mouth every 6 (six) hours as needed for muscle spasms.     multivitamin tablet  Take 1 tablet by mouth daily.     omeprazole 20 MG capsule  Commonly known as:  PRILOSEC  Take 1 capsule by mouth two times daily     onetouch ultrasoft lancets  Use daily     oxyCODONE-acetaminophen 5-325 MG per tablet  Commonly known as:  PERCOCET/ROXICET  Take 1-2 tablets by mouth every 4 (four) hours as needed for moderate pain.     pregabalin 75 MG capsule  Commonly known as:  LYRICA  TAKE ONE CAPSULE BY MOUTH ONE TIME DAILY     tiZANidine 4 MG tablet  Commonly known as:  ZANAFLEX  Take 1 tablet (4 mg total) by mouth every 6 (six) hours as needed for muscle spasms.     tobramycin 0.3 % ophthalmic solution  Commonly known as:  TOBREX  Place 1 drop into the right eye every 6 (six) hours.     traZODone 100 MG tablet  Commonly known as:  DESYREL  Take 2 tablets (200 mg total) by mouth at bedtime.         SignedStefani Dama: Militza Devery J 09/03/2014, 10:41 AM

## 2014-09-03 NOTE — Progress Notes (Signed)
Pt given D/C instructions with Rx, verbal understanding was provided. Pt's IV was removed prior to D/C. Pt's incision is clean and dry with no sign of infection. Pt D/C'd home via wheelchair @ 1330 per MD order. Pt is stable @ D/C and has no other needs at this time. Rema FendtAshley Leota Maka, RN

## 2014-09-03 NOTE — Discharge Instructions (Signed)

## 2014-09-04 ENCOUNTER — Other Ambulatory Visit: Payer: Self-pay | Admitting: Internal Medicine

## 2014-09-12 ENCOUNTER — Ambulatory Visit (INDEPENDENT_AMBULATORY_CARE_PROVIDER_SITE_OTHER): Payer: Medicare Other | Admitting: Internal Medicine

## 2014-09-12 ENCOUNTER — Encounter: Payer: Self-pay | Admitting: Internal Medicine

## 2014-09-12 VITALS — BP 100/50 | HR 82 | Temp 98.3°F | Resp 20 | Ht 62.0 in | Wt 195.0 lb

## 2014-09-12 DIAGNOSIS — L309 Dermatitis, unspecified: Secondary | ICD-10-CM | POA: Diagnosis not present

## 2014-09-12 MED ORDER — HYDROCORTISONE 2.5 % EX CREA: TOPICAL_CREAM | Freq: Two times a day (BID) | CUTANEOUS | Status: DC

## 2014-09-12 NOTE — Progress Notes (Signed)
Pre visit review using our clinic review tool, if applicable. No additional management support is needed unless otherwise documented below in the visit note. 

## 2014-09-12 NOTE — Patient Instructions (Signed)
Hydrocortisone cream as discussed  Call or return to clinic prn if these symptoms worsen or fail to improve as anticipated.

## 2014-09-12 NOTE — Progress Notes (Signed)
Subjective:    Patient ID: Katie Booker, female    DOB: 07/30/1945, 69 y.o.   MRN: [id number]  HPI  69 year old patient who is approximately 10 days status post C-spine surgery.  For the past several days she has had a erythematous, very pruritic rash that has been spreading about the surgical scar in the anterior neck region.  No fever or systemic complaints.  New medications include Percocet only. No topical therapy except Benadryl cream for the past day or so  Past Medical History  Diagnosis Date  . HYPERLIPIDEMIA 09/18/2009    takes Lipitor daily  . HYPOTHYROIDISM 09/18/2009  . OSTEOARTHRITIS 09/18/2009  . Obesity   . Hemorrhoids   . Anal fissure   . Anxiety     takes Xanax prn sleep and anxiety  . DEPRESSION 09/18/2009    takes Celexa daily  . HYPERTENSION 09/18/2009    takes Atenolol daily and Lisinopril  . Histoplasmosis     hx of  . Shortness of breath     with exertion  . OSA (obstructive sleep apnea)     has a CPAP and sleep study report in epic from 2009  . History of bronchitis     last time around 1990  . Headache(784.0)     sinus HA  . Peripheral neuropathy     takes Lyrica daily  . Joint pain   . Joint swelling   . Chronic back pain     scoliosis/stenosis/spondylosis  . Skin spots, red     right above both elbows;pt states always there  . GERD 09/18/2009    takes Omeprazole daily  . Umbilical hernia   . ANEMIA-NOS 09/18/2009    takes Ferrous Sulfate daily  . History of colon polyps   . Urinary frequency   . Urinary urgency   . Insomnia     takes trazodone and ambien nightly  . Cancer     basal cell left cheek  . DIABETES MELLITUS, TYPE II 09/18/2009    takes Amaryl daily and Metformin    History   Social History  . Marital Status: Divorced    Spouse Name: N/A  . Number of Children: N/A  . Years of Education: N/A   Occupational History  . retired    Social History Main Topics  . Smoking status: Former Smoker -- 1.00 packs/day for 20  years    Types: Cigarettes    Quit date: 02/23/2004  . Smokeless tobacco: Never Used     Comment: quit 2007  . Alcohol Use: Yes     Comment: rare  . Drug Use: No  . Sexual Activity: No   Other Topics Concern  . Not on file   Social History Narrative    Past Surgical History  Procedure Laterality Date  . Tubal fulgaration  1991  . Colonoscopy  2007  . Esophagogastroduodenoscopy  2007  . Hemorrhoid surgery  03/23/2011    Procedure: HEMORRHOIDECTOMY;  Surgeon: Clovis Pu. Cornett, MD;  Location: [facility];  Service: General;  Laterality: N/A;  lateral internal sphincterotomy and hemorrhoidectomy  . Sphincterotomy    . Thoracic discectomy N/A 12/15/2012    Procedure: Thoracic ten-eleven Thoracic laminectomy;  Surgeon: Barnett Abu, MD;  Location: [facility];  Service: Neurosurgery;  Laterality: N/A;  Thoracic ten-eleven Thoracic laminectomy  . Back surgery    . Anterior cervical decomp/discectomy fusion N/A 09/02/2014    Procedure: Cervical five- six  Anterior cervical decompression fusion;  Surgeon: Barnett Abu,  MD;  Location: [facility];  Service: Neurosurgery;  Laterality: N/A;  C5-6 Anterior cervical decompression/diskectomy/fusion    Family History  Problem Relation Age of Onset  . Diabetes Father   . COPD Father   . Cancer Maternal Grandfather     stomach    Allergies  Allergen Reactions  . Hydromorphone Hcl     Makes crazy  . Morphine And Related Other (See Comments)    hallucinations    Current Outpatient Prescriptions on File Prior to Visit  Medication Sig Dispense Refill  . acetaminophen (TYLENOL) 650 MG CR tablet Take 650 mg by mouth every 8 (eight) hours as needed for pain.    Marland Kitchen ALPRAZolam (XANAX) 0.5 MG tablet Take 1 tablet (0.5 mg total) by mouth 2 (two) times daily. 180 tablet 1  . atenolol (TENORMIN) 25 MG tablet Take 1 tablet by mouth  daily 90 tablet 1  . atorvastatin (LIPITOR) 40 MG tablet Take 1 tablet by mouth once daily 90 tablet  0  . beta carotene w/minerals (OCUVITE) tablet Take 1 tablet by mouth daily.    . Calcium Carbonate-Vit D-Min (GNP CALCIUM 600 PLUS D/MINERAL) 600-400 MG-UNIT TABS Take 1 tablet by mouth 2 (two) times daily.      . Carboxymethylcellulose Sodium (LUBRICANT EYE DROPS OP) Apply 2 drops to eye 4 (four) times daily as needed.    . citalopram (CELEXA) 40 MG tablet Take 1 tablet by mouth  daily 90 tablet 1  . gemfibrozil (LOPID) 600 MG tablet Take one tablet by mouth  twice daily before meals 180 tablet 3  . glimepiride (AMARYL) 2 MG tablet Take one-half tablet by  mouth daily 45 tablet 1  . glucose blood (ONE TOUCH ULTRA TEST) test strip 1 each by Other route daily. Use as instructed 100 each 4  . HYDROcodone-acetaminophen (NORCO/VICODIN) 5-325 MG per tablet 1 by mouth every 6 hours as needed for pain **DO NOT EXCEED 4 GM OF TYLENOL IN 24 HOURS** 120 tablet 0  . Lancets (ONETOUCH ULTRASOFT) lancets Use daily 100 each 6  . levothyroxine (SYNTHROID, LEVOTHROID) 150 MCG tablet Take 1 tablet by mouth once daily 90 tablet 1  . lisinopril (PRINIVIL,ZESTRIL) 10 MG tablet Take 1 tablet by mouth once daily 90 tablet 3  . loratadine (CLARITIN) 10 MG tablet Take 10 mg by mouth daily as needed for allergies.    . meloxicam (MOBIC) 15 MG tablet Take 1 tablet by mouth  daily 90 tablet 1  . metFORMIN (GLUCOPHAGE) 1000 MG tablet take 1 tablet by mouth  twice daily with meal 180 tablet 3  . methocarbamol (ROBAXIN) 500 MG tablet Take 500 mg by mouth every 6 (six) hours as needed for muscle spasms.    . Multiple Vitamin (MULTIVITAMIN) tablet Take 1 tablet by mouth daily.      . Omega-3 Fatty Acids (FISH OIL) 1200 MG CAPS Take 2 capsules by mouth 2 (two) times daily.    Marland Kitchen omeprazole (PRILOSEC) 20 MG capsule Take 1 capsule by mouth two times daily 180 capsule 1  . pregabalin (LYRICA) 75 MG capsule TAKE ONE CAPSULE BY MOUTH ONE TIME DAILY 90 capsule 1  . tiZANidine (ZANAFLEX) 4 MG tablet Take 1 tablet (4 mg total) by mouth  every 6 (six) hours as needed for muscle spasms. 90 tablet 5  . tobramycin (TOBREX) 0.3 % ophthalmic solution Place 1 drop into the right eye every 6 (six) hours.     . traZODone (DESYREL) 100 MG tablet Take 2 tablets  by mouth at  bedtime 180 tablet 1   No current facility-administered medications on file prior to visit.    BP 100/50 mmHg  Pulse 82  Temp(Src) 98.3 F (36.8 C) (Oral)  Resp 20  Ht 5\' 2"  (1.575 m)  Wt 195 lb (88.451 kg)  BMI 35.66 kg/m2  SpO2 94%     Review of Systems  Skin: Positive for rash.       Objective:   Physical Exam  Skin: Rash noted.  Erythematous maculopapular rash involving the anterior neck area.  This spreads to the upper chest and cephalad to the region just below the chin.  Quite pruritic Incision is healing nicely without drainage.  There is a slight area of induration involving the lateral margin of the incision, but not fluctuant or tender          Assessment & Plan:  Dermatitis anterior neck.  Percocet has been discontinued.  We'll treat with a low-dose hydrocortisone cream short-term.  Local wound care discussed.  She will report any clinical worsening or new symptoms.  This does not appear to be cellulitis

## 2014-09-25 ENCOUNTER — Telehealth: Payer: Self-pay | Admitting: Internal Medicine

## 2014-09-25 DIAGNOSIS — G959 Disease of spinal cord, unspecified: Secondary | ICD-10-CM | POA: Diagnosis not present

## 2014-09-25 NOTE — Telephone Encounter (Signed)
Pt would like new rx hydrocodone °

## 2014-09-26 MED ORDER — HYDROCODONE-ACETAMINOPHEN 5-325 MG PO TABS: ORAL_TABLET | ORAL | Status: DC

## 2014-09-26 NOTE — Telephone Encounter (Signed)
Patient is aware that Rx is ready for pick up. 

## 2014-09-26 NOTE — Telephone Encounter (Signed)
90

## 2014-09-27 ENCOUNTER — Other Ambulatory Visit: Payer: Self-pay | Admitting: *Deleted

## 2014-09-27 DIAGNOSIS — H3531 Nonexudative age-related macular degeneration: Secondary | ICD-10-CM | POA: Diagnosis not present

## 2014-09-27 DIAGNOSIS — H3532 Exudative age-related macular degeneration: Secondary | ICD-10-CM | POA: Diagnosis not present

## 2014-09-27 MED ORDER — PREGABALIN 75 MG PO CAPS: ORAL_CAPSULE | ORAL | Status: DC

## 2014-09-27 NOTE — Telephone Encounter (Signed)
Printed Rx faxed to [facility] at [phone].

## 2014-10-07 ENCOUNTER — Ambulatory Visit (INDEPENDENT_AMBULATORY_CARE_PROVIDER_SITE_OTHER): Payer: Medicare Other | Admitting: Internal Medicine

## 2014-10-07 ENCOUNTER — Encounter: Payer: Self-pay | Admitting: Internal Medicine

## 2014-10-07 VITALS — BP 120/64 | HR 81 | Temp 98.8°F | Resp 20 | Ht 62.0 in | Wt 193.0 lb

## 2014-10-07 DIAGNOSIS — M4715 Other spondylosis with myelopathy, thoracolumbar region: Secondary | ICD-10-CM | POA: Diagnosis not present

## 2014-10-07 DIAGNOSIS — I10 Essential (primary) hypertension: Secondary | ICD-10-CM

## 2014-10-07 DIAGNOSIS — E119 Type 2 diabetes mellitus without complications: Secondary | ICD-10-CM | POA: Diagnosis not present

## 2014-10-07 MED ORDER — ESTRADIOL 0.1 MG/GM VA CREA: 1.0000 | TOPICAL_CREAM | VAGINAL | Status: DC

## 2014-10-07 NOTE — Patient Instructions (Addendum)
Please check your hemoglobin A1c every 3 months  Follow-up Dr. Danielle Dess

## 2014-10-07 NOTE — Progress Notes (Signed)
Subjective:    Patient ID: Katie Booker, female    DOB: 1945-10-28, 70 y.o.   MRN: [id number]  HPI  69 year old patient who has essential hypertension and diabetes.  She has had recent surgery for spondylosis with myelopathy and has had persistent pain and swelling from the incision involving her anterior neck region.  She describes a minimal drainage.  Postoperatively, she did have a rash in the distribution of preoperative prep that has resolved.  No fever or constitutional complaints  Past Medical History  Diagnosis Date  . HYPERLIPIDEMIA 09/18/2009    takes Lipitor daily  . HYPOTHYROIDISM 09/18/2009  . OSTEOARTHRITIS 09/18/2009  . Obesity   . Hemorrhoids   . Anal fissure   . Anxiety     takes Xanax prn sleep and anxiety  . DEPRESSION 09/18/2009    takes Celexa daily  . HYPERTENSION 09/18/2009    takes Atenolol daily and Lisinopril  . Histoplasmosis     hx of  . Shortness of breath     with exertion  . OSA (obstructive sleep apnea)     has a CPAP and sleep study report in epic from 2009  . History of bronchitis     last time around 1990  . Headache(784.0)     sinus HA  . Peripheral neuropathy     takes Lyrica daily  . Joint pain   . Joint swelling   . Chronic back pain     scoliosis/stenosis/spondylosis  . Skin spots, red     right above both elbows;pt states always there  . GERD 09/18/2009    takes Omeprazole daily  . Umbilical hernia   . ANEMIA-NOS 09/18/2009    takes Ferrous Sulfate daily  . History of colon polyps   . Urinary frequency   . Urinary urgency   . Insomnia     takes trazodone and ambien nightly  . Cancer     basal cell left cheek  . DIABETES MELLITUS, TYPE II 09/18/2009    takes Amaryl daily and Metformin    Social History   Social History  . Marital Status: Divorced    Spouse Name: N/A  . Number of Children: N/A  . Years of Education: N/A   Occupational History  . retired    Social History Main Topics  . Smoking status: Former  Smoker -- 1.00 packs/day for 20 years    Types: Cigarettes    Quit date: 02/23/2004  . Smokeless tobacco: Never Used     Comment: quit 2007  . Alcohol Use: Yes     Comment: rare  . Drug Use: No  . Sexual Activity: No   Other Topics Concern  . Not on file   Social History Narrative    Past Surgical History  Procedure Laterality Date  . Tubal fulgaration  1991  . Colonoscopy  2007  . Esophagogastroduodenoscopy  2007  . Hemorrhoid surgery  03/23/2011    Procedure: HEMORRHOIDECTOMY;  Surgeon: Clovis Pu. Cornett, MD;  [facility];  Service: General;  Laterality: N/A;  lateral internal sphincterotomy and hemorrhoidectomy  . Sphincterotomy    . Thoracic discectomy N/A 12/15/2012    Procedure: Thoracic ten-eleven Thoracic laminectomy;  Surgeon: Barnett Abu, MD;  Location: [facility];  Service: Neurosurgery;  Laterality: N/A;  Thoracic ten-eleven Thoracic laminectomy  . Back surgery    . Anterior cervical decomp/discectomy fusion N/A 09/02/2014    Procedure: Cervical five- six  Anterior cervical decompression fusion;  Surgeon: Barnett Abu,  MD;  Location: [facility];  Service: Neurosurgery;  Laterality: N/A;  C5-6 Anterior cervical decompression/diskectomy/fusion    Family History  Problem Relation Age of Onset  . Diabetes Father   . COPD Father   . Cancer Maternal Grandfather     stomach    Allergies  Allergen Reactions  . Hydromorphone Hcl     Makes crazy  . Morphine And Related Other (See Comments)    hallucinations    Current Outpatient Prescriptions on File Prior to Visit  Medication Sig Dispense Refill  . acetaminophen (TYLENOL) 650 MG CR tablet Take 650 mg by mouth every 8 (eight) hours as needed for pain.    Marland Kitchen ALPRAZolam (XANAX) 0.5 MG tablet Take 1 tablet (0.5 mg total) by mouth 2 (two) times daily. 180 tablet 1  . atenolol (TENORMIN) 25 MG tablet Take 1 tablet by mouth  daily 90 tablet 1  . atorvastatin (LIPITOR) 40 MG tablet Take 1  tablet by mouth once daily 90 tablet 0  . beta carotene w/minerals (OCUVITE) tablet Take 1 tablet by mouth daily.    . Calcium Carbonate-Vit D-Min (GNP CALCIUM 600 PLUS D/MINERAL) 600-400 MG-UNIT TABS Take 1 tablet by mouth 2 (two) times daily.      . Carboxymethylcellulose Sodium (LUBRICANT EYE DROPS OP) Apply 2 drops to eye 4 (four) times daily as needed.    . citalopram (CELEXA) 40 MG tablet Take 1 tablet by mouth  daily 90 tablet 1  . gemfibrozil (LOPID) 600 MG tablet Take one tablet by mouth  twice daily before meals 180 tablet 3  . glimepiride (AMARYL) 2 MG tablet Take one-half tablet by  mouth daily 45 tablet 1  . glucose blood (ONE TOUCH ULTRA TEST) test strip 1 each by Other route daily. Use as instructed 100 each 4  . HYDROcodone-acetaminophen (NORCO/VICODIN) 5-325 MG per tablet 1 by mouth every 6 hours as needed for pain **DO NOT EXCEED 4 GM OF TYLENOL IN 24 HOURS** 90 tablet 0  . hydrocortisone 2.5 % cream Apply topically 2 (two) times daily. 60 g 0  . Lancets (ONETOUCH ULTRASOFT) lancets Use daily 100 each 6  . levothyroxine (SYNTHROID, LEVOTHROID) 150 MCG tablet Take 1 tablet by mouth once daily 90 tablet 1  . lisinopril (PRINIVIL,ZESTRIL) 10 MG tablet Take 1 tablet by mouth once daily 90 tablet 3  . loratadine (CLARITIN) 10 MG tablet Take 10 mg by mouth daily as needed for allergies.    . meloxicam (MOBIC) 15 MG tablet Take 1 tablet by mouth  daily 90 tablet 1  . metFORMIN (GLUCOPHAGE) 1000 MG tablet take 1 tablet by mouth  twice daily with meal 180 tablet 3  . methocarbamol (ROBAXIN) 500 MG tablet Take 500 mg by mouth every 6 (six) hours as needed for muscle spasms.    . Multiple Vitamin (MULTIVITAMIN) tablet Take 1 tablet by mouth daily.      . Omega-3 Fatty Acids (FISH OIL) 1200 MG CAPS Take 2 capsules by mouth 2 (two) times daily.    Marland Kitchen omeprazole (PRILOSEC) 20 MG capsule Take 1 capsule by mouth two times daily 180 capsule 1  . pregabalin (LYRICA) 75 MG capsule TAKE ONE CAPSULE  BY MOUTH ONE TIME DAILY 90 capsule 1  . tobramycin (TOBREX) 0.3 % ophthalmic solution Place 1 drop into the right eye every 6 (six) hours.     . traZODone (DESYREL) 100 MG tablet Take 2 tablets by mouth at  bedtime 180 tablet 1   No current  facility-administered medications on file prior to visit.    BP 120/64 mmHg  Pulse 81  Temp(Src) 98.8 F (37.1 C) (Oral)  Resp 20  Ht 5\' 2"  (1.575 m)  Wt 193 lb (87.544 kg)  BMI 35.29 kg/m2  SpO2 97%     Review of Systems  Skin: Positive for rash and wound.       Objective:   Physical Exam  Skin:  Incision in the anterior neck.  Generally healing nicely except for the left lateral scar which had a 1 x 1.5 erythematous, slightly tender nodule.  No fluctuance or drainage          Assessment & Plan:   Postoperative wound infection.  Will follow-up with her surgeon Hypertension, stable Diabetes, stable

## 2014-10-07 NOTE — Progress Notes (Signed)
Pre visit review using our clinic review tool, if applicable. No additional management support is needed unless otherwise documented below in the visit note. 

## 2014-10-08 ENCOUNTER — Encounter: Payer: Self-pay | Admitting: Internal Medicine

## 2014-10-17 ENCOUNTER — Telehealth: Payer: Self-pay | Admitting: Internal Medicine

## 2014-10-17 MED ORDER — HYDROCODONE-ACETAMINOPHEN 5-325 MG PO TABS: ORAL_TABLET | ORAL | Status: DC

## 2014-10-17 NOTE — Telephone Encounter (Signed)
Spoke to pt, said she does not have enough of her Hydrocodone and was told by insurance that she was only given a 22 day supply. Asked pt how often are you taking the medication? Pt said she is taking 3-4 a day and used to get 120 a month. Told pt yes, according to last refill prior to this one you did get 120. I was not here so Dr.K probably was not aware that you were getting 120 tablets. Told pt will give her a Rx with 120 tablets and will be ready in an hour or so. Pt verbalized understanding. Rx printed and signed.

## 2014-10-17 NOTE — Telephone Encounter (Signed)
Pt request refill HYDROcodone-acetaminophen (NORCO/VICODIN) 5-325 MG per tablet  Pt is aware due on 9/4 but would like to speak w/ you if possible concerning an early refill.

## 2014-10-30 DIAGNOSIS — H35372 Puckering of macula, left eye: Secondary | ICD-10-CM | POA: Diagnosis not present

## 2014-10-30 DIAGNOSIS — H3531 Nonexudative age-related macular degeneration: Secondary | ICD-10-CM | POA: Diagnosis not present

## 2014-10-30 DIAGNOSIS — H3532 Exudative age-related macular degeneration: Secondary | ICD-10-CM | POA: Diagnosis not present

## 2014-10-30 DIAGNOSIS — H43813 Vitreous degeneration, bilateral: Secondary | ICD-10-CM | POA: Diagnosis not present

## 2014-11-07 DIAGNOSIS — G5602 Carpal tunnel syndrome, left upper limb: Secondary | ICD-10-CM | POA: Diagnosis not present

## 2014-11-07 DIAGNOSIS — G629 Polyneuropathy, unspecified: Secondary | ICD-10-CM | POA: Diagnosis not present

## 2014-11-07 DIAGNOSIS — G5601 Carpal tunnel syndrome, right upper limb: Secondary | ICD-10-CM | POA: Diagnosis not present

## 2014-11-07 DIAGNOSIS — G5622 Lesion of ulnar nerve, left upper limb: Secondary | ICD-10-CM | POA: Diagnosis not present

## 2014-11-07 DIAGNOSIS — G5621 Lesion of ulnar nerve, right upper limb: Secondary | ICD-10-CM | POA: Diagnosis not present

## 2014-11-12 ENCOUNTER — Other Ambulatory Visit: Payer: Self-pay | Admitting: Internal Medicine

## 2014-11-13 DIAGNOSIS — G5601 Carpal tunnel syndrome, right upper limb: Secondary | ICD-10-CM | POA: Diagnosis not present

## 2014-11-18 ENCOUNTER — Telehealth: Payer: Self-pay | Admitting: Internal Medicine

## 2014-11-18 MED ORDER — HYDROCODONE-ACETAMINOPHEN 5-325 MG PO TABS: ORAL_TABLET | ORAL | Status: DC

## 2014-11-18 NOTE — Telephone Encounter (Signed)
Pt notified Rx ready for pickup. Rx printed and signed.  

## 2014-11-18 NOTE — Telephone Encounter (Signed)
Pt need new rx hydrocodone °

## 2014-11-26 ENCOUNTER — Other Ambulatory Visit: Payer: Self-pay | Admitting: Internal Medicine

## 2014-11-27 DIAGNOSIS — H43813 Vitreous degeneration, bilateral: Secondary | ICD-10-CM | POA: Diagnosis not present

## 2014-11-27 DIAGNOSIS — H353211 Exudative age-related macular degeneration, right eye, with active choroidal neovascularization: Secondary | ICD-10-CM | POA: Diagnosis not present

## 2014-11-27 DIAGNOSIS — H35372 Puckering of macula, left eye: Secondary | ICD-10-CM | POA: Diagnosis not present

## 2014-12-06 DIAGNOSIS — G5601 Carpal tunnel syndrome, right upper limb: Secondary | ICD-10-CM | POA: Diagnosis not present

## 2014-12-10 ENCOUNTER — Other Ambulatory Visit: Payer: Self-pay | Admitting: Internal Medicine

## 2014-12-18 ENCOUNTER — Telehealth: Payer: Self-pay | Admitting: Internal Medicine

## 2014-12-18 MED ORDER — HYDROCODONE-ACETAMINOPHEN 5-325 MG PO TABS: ORAL_TABLET | ORAL | Status: DC

## 2014-12-18 NOTE — Telephone Encounter (Signed)
Pt notified Rx ready for pickup. Rx printed and signed.  

## 2014-12-18 NOTE — Telephone Encounter (Signed)
Pt req refill HYDROcodone-acetaminophen (NORCO/VICODIN) 5-325 MG per tablet

## 2014-12-25 ENCOUNTER — Ambulatory Visit (INDEPENDENT_AMBULATORY_CARE_PROVIDER_SITE_OTHER): Payer: Medicare Other | Admitting: Family Medicine

## 2014-12-25 ENCOUNTER — Encounter: Payer: Self-pay | Admitting: Family Medicine

## 2014-12-25 VITALS — BP 130/66 | HR 81 | Temp 98.5°F | Ht 62.0 in | Wt 192.0 lb

## 2014-12-25 DIAGNOSIS — Z23 Encounter for immunization: Secondary | ICD-10-CM

## 2014-12-25 DIAGNOSIS — M25571 Pain in right ankle and joints of right foot: Secondary | ICD-10-CM

## 2014-12-25 NOTE — Progress Notes (Signed)
Pre visit review using our clinic review tool, if applicable. No additional management support is needed unless otherwise documented below in the visit note. 

## 2014-12-25 NOTE — Progress Notes (Signed)
   Subjective:    Patient ID: Katie Booker, female    DOB: 02/06/1946, 69 y.o.   MRN: [id number]  HPI Here for 5 days of swelling and pain in the right foot and ankle. It started in the evening after she had been up on her feet much of the day. No specific trauma hx. The foot was red and hot and very tender yesterday but this has calmed down a bit today. She is already taking Meloxicam, Lyrica, and Norco every day.    Review of Systems  Constitutional: Negative.   Musculoskeletal: Positive for arthralgias.       Objective:   Physical Exam  Constitutional: She appears well-developed and well-nourished.  Musculoskeletal:  The lateral right foot is puffy and mildly tender. No masses or crepitus felt. No erythema or warmth. She is tender around the medial and lateral ankles also   Skin: No erythema.          Assessment & Plan:  Foot and ankle pain. Two possible etiologies are gout and a metatarsal stress fracture. We will check a uric acid level and we will set up Xrays of the foot soon.

## 2014-12-26 ENCOUNTER — Ambulatory Visit (INDEPENDENT_AMBULATORY_CARE_PROVIDER_SITE_OTHER)
Admission: RE | Admit: 2014-12-26 | Discharge: 2014-12-26 | Disposition: A | Discharge status: Home / Self Care | Payer: Medicare Other | Source: Ambulatory Visit | Attending: Family Medicine | Admitting: Family Medicine

## 2014-12-26 DIAGNOSIS — M25571 Pain in right ankle and joints of right foot: Secondary | ICD-10-CM | POA: Diagnosis not present

## 2014-12-26 DIAGNOSIS — M79671 Pain in right foot: Secondary | ICD-10-CM | POA: Diagnosis not present

## 2014-12-26 LAB — URIC ACID: Uric Acid, Serum: 5 mg/dL (ref 2.4–7.0)

## 2014-12-27 DIAGNOSIS — H43813 Vitreous degeneration, bilateral: Secondary | ICD-10-CM | POA: Diagnosis not present

## 2014-12-27 DIAGNOSIS — H35372 Puckering of macula, left eye: Secondary | ICD-10-CM | POA: Diagnosis not present

## 2014-12-27 DIAGNOSIS — H353122 Nonexudative age-related macular degeneration, left eye, intermediate dry stage: Secondary | ICD-10-CM | POA: Diagnosis not present

## 2014-12-27 DIAGNOSIS — H353212 Exudative age-related macular degeneration, right eye, with inactive choroidal neovascularization: Secondary | ICD-10-CM | POA: Diagnosis not present

## 2014-12-31 ENCOUNTER — Other Ambulatory Visit: Payer: Self-pay | Admitting: Internal Medicine

## 2015-01-01 ENCOUNTER — Other Ambulatory Visit: Payer: Medicare Other

## 2015-01-01 DIAGNOSIS — H3530 Unspecified macular degeneration: Secondary | ICD-10-CM | POA: Diagnosis not present

## 2015-01-07 ENCOUNTER — Encounter: Payer: Medicare Other | Admitting: Internal Medicine

## 2015-01-09 DIAGNOSIS — M4806 Spinal stenosis, lumbar region: Secondary | ICD-10-CM | POA: Diagnosis not present

## 2015-01-10 ENCOUNTER — Other Ambulatory Visit: Payer: Self-pay | Admitting: Neurological Surgery

## 2015-01-10 DIAGNOSIS — M48061 Spinal stenosis, lumbar region without neurogenic claudication: Secondary | ICD-10-CM

## 2015-01-13 ENCOUNTER — Other Ambulatory Visit (INDEPENDENT_AMBULATORY_CARE_PROVIDER_SITE_OTHER): Payer: Medicare Other

## 2015-01-13 DIAGNOSIS — E785 Hyperlipidemia, unspecified: Secondary | ICD-10-CM

## 2015-01-13 DIAGNOSIS — R7989 Other specified abnormal findings of blood chemistry: Secondary | ICD-10-CM

## 2015-01-13 DIAGNOSIS — Z0000 Encounter for general adult medical examination without abnormal findings: Secondary | ICD-10-CM

## 2015-01-13 DIAGNOSIS — I10 Essential (primary) hypertension: Secondary | ICD-10-CM | POA: Diagnosis not present

## 2015-01-13 LAB — LIPID PANEL
CHOLESTEROL: 198 mg/dL (ref 0–200)
HDL: 54.8 mg/dL (ref >39.00)
NonHDL: 143.19
Total CHOL/HDL Ratio: 4
Triglycerides: 293 mg/dL — ABNORMAL HIGH (ref 0.0–149.0)
VLDL: 58.6 mg/dL — ABNORMAL HIGH (ref 0.0–40.0)

## 2015-01-13 LAB — HEPATIC FUNCTION PANEL
ALBUMIN: 4.4 g/dL (ref 3.5–5.2)
ALK PHOS: 95 U/L (ref 39–117)
ALT: 27 U/L (ref 0–35)
AST: 19 U/L (ref 0–37)
Bilirubin, Direct: 0.1 mg/dL (ref 0.0–0.3)
TOTAL PROTEIN: 7 g/dL (ref 6.0–8.3)
Total Bilirubin: 0.3 mg/dL (ref 0.2–1.2)

## 2015-01-13 LAB — HEMOGLOBIN A1C: HEMOGLOBIN A1C: 6.5 % (ref 4.6–6.5)

## 2015-01-13 LAB — BASIC METABOLIC PANEL
BUN: 16 mg/dL (ref 6–23)
CALCIUM: 9.6 mg/dL (ref 8.4–10.5)
CO2: 28 meq/L (ref 19–32)
Chloride: 101 mEq/L (ref 96–112)
Creatinine, Ser: 0.72 mg/dL (ref 0.40–1.20)
GFR: 85.4 mL/min (ref >60.00)
GLUCOSE: 128 mg/dL — AB (ref 70–99)
POTASSIUM: 4.8 meq/L (ref 3.5–5.1)
SODIUM: 140 meq/L (ref 135–145)

## 2015-01-13 LAB — POCT URINALYSIS DIPSTICK
Bilirubin, UA: NEGATIVE
GLUCOSE UA: NEGATIVE
Ketones, UA: NEGATIVE
NITRITE UA: NEGATIVE
PH UA: 6
PROTEIN UA: NEGATIVE
RBC UA: NEGATIVE
SPEC GRAV UA: 1.02
UROBILINOGEN UA: 0.2

## 2015-01-13 LAB — TSH: TSH: 3.39 u[IU]/mL (ref 0.35–4.50)

## 2015-01-13 LAB — LDL CHOLESTEROL, DIRECT: LDL DIRECT: 113 mg/dL

## 2015-01-14 LAB — CBC WITH DIFFERENTIAL/PLATELET
Basophils Absolute: 0 10*3/uL (ref 0.0–0.1)
Basophils Relative: 0.5 % (ref 0.0–3.0)
Eosinophils Absolute: 0.4 10*3/uL (ref 0.0–0.7)
Eosinophils Relative: 6.8 % — ABNORMAL HIGH (ref 0.0–5.0)
HCT: 37.5 % (ref 36.0–46.0)
Hemoglobin: 12.1 g/dL (ref 12.0–15.0)
Lymphocytes Relative: 37.9 % (ref 12.0–46.0)
Lymphs Abs: 2.4 10*3/uL (ref 0.7–4.0)
MCHC: 32.3 g/dL (ref 30.0–36.0)
MCV: 78.6 fl (ref 78.0–100.0)
Monocytes Absolute: 0.4 10*3/uL (ref 0.1–1.0)
Monocytes Relative: 7 % (ref 3.0–12.0)
Neutro Abs: 3 10*3/uL (ref 1.4–7.7)
Neutrophils Relative %: 47.8 % (ref 43.0–77.0)
Platelets: 226 10*3/uL (ref 150.0–400.0)
RBC: 4.78 Mil/uL (ref 3.87–5.11)
RDW: 14.9 % (ref 11.5–15.5)
WBC: 6.3 10*3/uL (ref 4.0–10.5)

## 2015-01-20 ENCOUNTER — Telehealth: Payer: Self-pay | Admitting: Internal Medicine

## 2015-01-20 MED ORDER — HYDROCODONE-ACETAMINOPHEN 5-325 MG PO TABS: ORAL_TABLET | ORAL | Status: DC

## 2015-01-20 NOTE — Telephone Encounter (Signed)
° ° ° ° ° °  Pt request refill of the following: ° °HYDROcodone-acetaminophen (NORCO/VICODIN) 5-325 MG tablet ° ° °Phamacy: °

## 2015-01-20 NOTE — Telephone Encounter (Signed)
Pt notified Rx ready for pickup. Rx printed and signed.  

## 2015-01-21 ENCOUNTER — Encounter: Payer: Medicare Other | Admitting: Internal Medicine

## 2015-01-24 DIAGNOSIS — H43813 Vitreous degeneration, bilateral: Secondary | ICD-10-CM | POA: Diagnosis not present

## 2015-01-24 DIAGNOSIS — H35372 Puckering of macula, left eye: Secondary | ICD-10-CM | POA: Diagnosis not present

## 2015-01-24 DIAGNOSIS — H353122 Nonexudative age-related macular degeneration, left eye, intermediate dry stage: Secondary | ICD-10-CM | POA: Diagnosis not present

## 2015-01-24 DIAGNOSIS — H353212 Exudative age-related macular degeneration, right eye, with inactive choroidal neovascularization: Secondary | ICD-10-CM | POA: Diagnosis not present

## 2015-01-25 ENCOUNTER — Other Ambulatory Visit: Payer: Self-pay | Admitting: Internal Medicine

## 2015-01-26 ENCOUNTER — Ambulatory Visit
Admission: RE | Admit: 2015-01-26 | Discharge: 2015-01-26 | Disposition: A | Discharge status: Home / Self Care | Payer: Medicare Other | Source: Ambulatory Visit | Attending: Neurological Surgery | Admitting: Neurological Surgery

## 2015-01-26 DIAGNOSIS — M48061 Spinal stenosis, lumbar region without neurogenic claudication: Secondary | ICD-10-CM

## 2015-01-28 ENCOUNTER — Ambulatory Visit (INDEPENDENT_AMBULATORY_CARE_PROVIDER_SITE_OTHER): Payer: Medicare Other | Admitting: Internal Medicine

## 2015-01-28 ENCOUNTER — Encounter: Payer: Self-pay | Admitting: Internal Medicine

## 2015-01-28 VITALS — BP 130/86 | HR 77 | Temp 99.3°F | Resp 20 | Ht 62.0 in | Wt 199.0 lb

## 2015-01-28 DIAGNOSIS — M549 Dorsalgia, unspecified: Secondary | ICD-10-CM

## 2015-01-28 DIAGNOSIS — Z0000 Encounter for general adult medical examination without abnormal findings: Secondary | ICD-10-CM

## 2015-01-28 DIAGNOSIS — E119 Type 2 diabetes mellitus without complications: Secondary | ICD-10-CM

## 2015-01-28 DIAGNOSIS — G8929 Other chronic pain: Secondary | ICD-10-CM

## 2015-01-28 DIAGNOSIS — E039 Hypothyroidism, unspecified: Secondary | ICD-10-CM

## 2015-01-28 DIAGNOSIS — E785 Hyperlipidemia, unspecified: Secondary | ICD-10-CM

## 2015-01-28 DIAGNOSIS — I10 Essential (primary) hypertension: Secondary | ICD-10-CM

## 2015-01-28 NOTE — Patient Instructions (Signed)
It is important that you exercise regularly, at least 20 minutes 3 to 4 times per week.  If you develop chest pain or shortness of breath seek  medical attention.  You need to lose weight.  Consider a lower calorie diet and regular exercise.  Limit your sodium (Salt) intake   Please check your hemoglobin A1c every 3 months  Menopause is a normal process in which your reproductive ability comes to an end. This process happens gradually over a span of months to years, usually between the ages of 49 and 38. Menopause is complete when you have missed 12 consecutive menstrual periods. It is important to talk with your health care provider about some of the most common conditions that affect postmenopausal women, such as heart disease, cancer, and bone loss (osteoporosis). Adopting a healthy lifestyle and getting preventive care can help to promote your health and wellness. Those actions can also lower your chances of developing some of these common conditions. WHAT SHOULD I KNOW ABOUT MENOPAUSE? During menopause, you may experience a number of symptoms, such as:  Moderate-to-severe hot flashes.  Night sweats.  Decrease in sex drive.  Mood swings.  Headaches.  Tiredness.  Irritability.  Memory problems.  Insomnia. Choosing to treat or not to treat menopausal changes is an individual decision that you make with your health care provider. WHAT SHOULD I KNOW ABOUT HORMONE REPLACEMENT THERAPY AND SUPPLEMENTS? Hormone therapy products are effective for treating symptoms that are associated with menopause, such as hot flashes and night sweats. Hormone replacement carries certain risks, especially as you become older. If you are thinking about using estrogen or estrogen with progestin treatments, discuss the benefits and risks with your health care provider. WHAT SHOULD I KNOW ABOUT HEART DISEASE AND STROKE? Heart disease, heart attack, and stroke become more likely as you age. This may be  due, in part, to the hormonal changes that your body experiences during menopause. These can affect how your body processes dietary fats, triglycerides, and cholesterol. Heart attack and stroke are both medical emergencies. There are many things that you can do to help prevent heart disease and stroke:  Have your blood pressure checked at least every 1-2 years. High blood pressure causes heart disease and increases the risk of stroke.  If you are 52-59 years old, ask your health care provider if you should take aspirin to prevent a heart attack or a stroke.  Do not use any tobacco products, including cigarettes, chewing tobacco, or electronic cigarettes. If you need help quitting, ask your health care provider.  It is important to eat a healthy diet and maintain a healthy weight.  Be sure to include plenty of vegetables, fruits, low-fat dairy products, and lean protein.  Avoid eating foods that are high in solid fats, added sugars, or salt (sodium).  Get regular exercise. This is one of the most important things that you can do for your health.  Try to exercise for at least 150 minutes each week. The type of exercise that you do should increase your heart rate and make you sweat. This is known as moderate-intensity exercise.  Try to do strengthening exercises at least twice each week. Do these in addition to the moderate-intensity exercise.  Know your numbers.Ask your health care provider to check your cholesterol and your blood glucose. Continue to have your blood tested as directed by your health care provider. WHAT SHOULD I KNOW ABOUT CANCER SCREENING? There are several types of cancer. Take the following steps to  reduce your risk and to catch any cancer development as early as possible. Breast Cancer  Practice breast self-awareness.  This means understanding how your breasts normally appear and feel.  It also means doing regular breast self-exams. Let your health care provider know  about any changes, no matter how small.  If you are 24 or older, have a clinician do a breast exam (clinical breast exam or CBE) every year. Depending on your age, family history, and medical history, it may be recommended that you also have a yearly breast X-ray (mammogram).  If you have a family history of breast cancer, talk with your health care provider about genetic screening.  If you are at high risk for breast cancer, talk with your health care provider about having an MRI and a mammogram every year.  Breast cancer (BRCA) gene test is recommended for women who have family members with BRCA-related cancers. Results of the assessment will determine the need for genetic counseling and BRCA1 and for BRCA2 testing. BRCA-related cancers include these types:  Breast. This occurs in males or females.  Ovarian.  Tubal. This may also be called fallopian tube cancer.  Cancer of the abdominal or pelvic lining (peritoneal cancer).  Prostate.  Pancreatic. Cervical, Uterine, and Ovarian Cancer Your health care provider may recommend that you be screened regularly for cancer of the pelvic organs. These include your ovaries, uterus, and vagina. This screening involves a pelvic exam, which includes checking for microscopic changes to the surface of your cervix (Pap test).  For women ages 21-65, health care providers may recommend a pelvic exam and a Pap test every three years. For women ages 76-65, they may recommend the Pap test and pelvic exam, combined with testing for human papilloma virus (HPV), every five years. Some types of HPV increase your risk of cervical cancer. Testing for HPV may also be done on women of any age who have unclear Pap test results.  Other health care providers may not recommend any screening for nonpregnant women who are considered low risk for pelvic cancer and have no symptoms. Ask your health care provider if a screening pelvic exam is right for you.  If you have had  past treatment for cervical cancer or a condition that could lead to cancer, you need Pap tests and screening for cancer for at least 20 years after your treatment. If Pap tests have been discontinued for you, your risk factors (such as having a new sexual partner) need to be reassessed to determine if you should start having screenings again. Some women have medical problems that increase the chance of getting cervical cancer. In these cases, your health care provider may recommend that you have screening and Pap tests more often.  If you have a family history of uterine cancer or ovarian cancer, talk with your health care provider about genetic screening.  If you have vaginal bleeding after reaching menopause, tell your health care provider.  There are currently no reliable tests available to screen for ovarian cancer. Lung Cancer Lung cancer screening is recommended for adults 34-60 years old who are at high risk for lung cancer because of a history of smoking. A yearly low-dose CT scan of the lungs is recommended if you:  Currently smoke.  Have a history of at least 30 pack-years of smoking and you currently smoke or have quit within the past 15 years. A pack-year is smoking an average of one pack of cigarettes per day for one year. Yearly screening  should:  Continue until it has been 15 years since you quit.  Stop if you develop a health problem that would prevent you from having lung cancer treatment. Colorectal Cancer  This type of cancer can be detected and can often be prevented.  Routine colorectal cancer screening usually begins at age 6 and continues through age 10.  If you have risk factors for colon cancer, your health care provider may recommend that you be screened at an earlier age.  If you have a family history of colorectal cancer, talk with your health care provider about genetic screening.  Your health care provider may also recommend using home test kits to check  for hidden blood in your stool.  A small camera at the end of a tube can be used to examine your colon directly (sigmoidoscopy or colonoscopy). This is done to check for the earliest forms of colorectal cancer.  Direct examination of the colon should be repeated every 5-10 years until age 2. However, if early forms of precancerous polyps or small growths are found or if you have a family history or genetic risk for colorectal cancer, you may need to be screened more often. Skin Cancer  Check your skin from head to toe regularly.  Monitor any moles. Be sure to tell your health care provider:  About any new moles or changes in moles, especially if there is a change in a mole's shape or color.  If you have a mole that is larger than the size of a pencil eraser.  If any of your family members has a history of skin cancer, especially at a young age, talk with your health care provider about genetic screening.  Always use sunscreen. Apply sunscreen liberally and repeatedly throughout the day.  Whenever you are outside, protect yourself by wearing long sleeves, pants, a wide-brimmed hat, and sunglasses. WHAT SHOULD I KNOW ABOUT OSTEOPOROSIS? Osteoporosis is a condition in which bone destruction happens more quickly than new bone creation. After menopause, you may be at an increased risk for osteoporosis. To help prevent osteoporosis or the bone fractures that can happen because of osteoporosis, the following is recommended:  If you are 64-51 years old, get at least 1,000 mg of calcium and at least 600 mg of vitamin D per day.  If you are older than age 25 but younger than age 67, get at least 1,200 mg of calcium and at least 600 mg of vitamin D per day.  If you are older than age 41, get at least 1,200 mg of calcium and at least 800 mg of vitamin D per day. Smoking and excessive alcohol intake increase the risk of osteoporosis. Eat foods that are rich in calcium and vitamin D, and do  weight-bearing exercises several times each week as directed by your health care provider. WHAT SHOULD I KNOW ABOUT HOW MENOPAUSE AFFECTS [place]? Depression may occur at any age, but it is more common as you become older. Common symptoms of depression include:  Low or sad mood.  Changes in sleep patterns.  Changes in appetite or eating patterns.  Feeling an overall lack of motivation or enjoyment of activities that you previously enjoyed.  Frequent crying spells. Talk with your health care provider if you think that you are experiencing depression. WHAT SHOULD I KNOW ABOUT IMMUNIZATIONS? It is important that you get and maintain your immunizations. These include:  Tetanus, diphtheria, and pertussis (Tdap) booster vaccine.  Influenza every year before the flu season begins.  Pneumonia vaccine.  Shingles vaccine. Your health care provider may also recommend other immunizations.   This information is not intended to replace advice given to you by your health care provider. Make sure you discuss any questions you have with your health care provider.   Document Released: 04/02/2005 Document Revised: 03/01/2014 Document Reviewed: 10/11/2013 Elsevier Interactive Patient Education [employer].

## 2015-01-28 NOTE — Progress Notes (Signed)
Subjective:    Patient ID: Katie Booker, female    DOB: [birth year], 69 y.o.   MRN: [id number]  HPI   69 year old patient who is seen today for a wellness exam.    Medical problems include type 2 diabetes. She has exogenous obesity and a history of dyslipidemia. She has treated hypertension. She is followed by OB/GYN as well as general surgery. She has had a prior hemorrhoidectomy earlier this year and is anticipating an umbilical hernia repair. In general she is doing reasonably well. She has a history of depression. She also has a history of OSA but has been intolerant of CPAP in the past. She also has a history of chronic low back pain, spinal stenosis.  She is status post cervical decompression in July 2016 and October 2016 had bilateral carpal tunnel release.  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   Past Surgical History: colonoscopy 2007 status post I-131 May 2011  Family History:  father died age 68, complications of diabetes, and senile dementia mother, age 68, with osteoporosis, dementia, heart failure one brother, age 68, with obesity, and hypertension one sister died age 68 MI  positive for colon cancer grandparent uncles with lung cancer  Social History:  Divorced formally employed by [employer] ( [employer], broadcasting/film/videobilling insurance collection) two children, 4 grandchildren.  discontinued tobacco 2007  Past Medical History  Diagnosis Date  . HYPERLIPIDEMIA 09/18/2009    takes Lipitor daily  . HYPOTHYROIDISM 09/18/2009  . OSTEOARTHRITIS 09/18/2009  . Obesity   . Hemorrhoids   . Anal fissure   . Anxiety     takes Xanax prn sleep and anxiety  . DEPRESSION 09/18/2009    takes Celexa daily  . HYPERTENSION 09/18/2009    takes Atenolol daily and Lisinopril  . Histoplasmosis     hx of  . Shortness of breath     with exertion  . OSA (obstructive sleep apnea)     has a CPAP and sleep study report in epic from 2009  . History of bronchitis     last  time around 1990  . Headache(784.0)     sinus HA  . Peripheral neuropathy (HCC)     takes Lyrica daily  . Joint pain   . Joint swelling   . Chronic back pain     scoliosis/stenosis/spondylosis  . Skin spots, red     right above both elbows;pt states always there  . GERD 09/18/2009    takes Omeprazole daily  . Umbilical hernia   . ANEMIA-NOS 09/18/2009    takes Ferrous Sulfate daily  . History of colon polyps   . Urinary frequency   . Urinary urgency   . Insomnia     takes trazodone and ambien nightly  . Cancer (HCC)     basal cell left cheek  . DIABETES MELLITUS, TYPE II 09/18/2009    takes Amaryl daily and Metformin    Social History   Social History  . Marital Status: Divorced    Spouse Name: N/A  . Number of Children: N/A  . Years of Education: N/A   Occupational History  . retired    Social History Main Topics  . Smoking status: Former Smoker -- 1.00 packs/day for 20 years    Types: Cigarettes    Quit date: 02/23/2004  . Smokeless tobacco: Never Used     Comment: quit 2007  . Alcohol Use: 0.0 oz/week    0 Standard drinks or equivalent per week  Comment: rare  . Drug Use: No  . Sexual Activity: No   Other Topics Concern  . Not on file   Social History Narrative    Past Surgical History  Procedure Laterality Date  . Tubal fulgaration  1991  . Colonoscopy  2007  . Esophagogastroduodenoscopy  2007  . Hemorrhoid surgery  03/23/2011    Procedure: HEMORRHOIDECTOMY;  Surgeon: Clovis Pu. Cornett, MD;  Location: [facility];  Service: General;  Laterality: N/A;  lateral internal sphincterotomy and hemorrhoidectomy  . Sphincterotomy    . Thoracic discectomy N/A 12/15/2012    Procedure: Thoracic ten-eleven Thoracic laminectomy;  Surgeon: Barnett Abu, MD;  Location: [facility];  Service: Neurosurgery;  Laterality: N/A;  Thoracic ten-eleven Thoracic laminectomy  . Back surgery    . Anterior cervical decomp/discectomy fusion N/A 09/02/2014     Procedure: Cervical five- six  Anterior cervical decompression fusion;  Surgeon: Barnett Abu, MD;  Location: [facility];  Service: Neurosurgery;  Laterality: N/A;  C5-6 Anterior cervical decompression/diskectomy/fusion    Family History  Problem Relation Age of Onset  . Diabetes Father   . COPD Father   . Cancer Maternal Grandfather     stomach    Allergies  Allergen Reactions  . Hydromorphone Hcl     Makes crazy  . Morphine And Related Other (See Comments)    hallucinations    Current Outpatient Prescriptions on File Prior to Visit  Medication Sig Dispense Refill  . acetaminophen (TYLENOL) 650 MG CR tablet Take 650 mg by mouth every 8 (eight) hours as needed for pain.    Marland Kitchen ALPRAZolam (XANAX) 0.5 MG tablet Take 1 tablet (0.5 mg total) by mouth 2 (two) times daily. 180 tablet 1  . atenolol (TENORMIN) 25 MG tablet Take 1 tablet by mouth  daily 90 tablet 1  . atorvastatin (LIPITOR) 40 MG tablet Take 1 tablet by mouth once daily 90 tablet 2  . beta carotene w/minerals (OCUVITE) tablet Take 1 tablet by mouth daily.    . Calcium Carbonate-Vit D-Min (GNP CALCIUM 600 PLUS D/MINERAL) 600-400 MG-UNIT TABS Take 1 tablet by mouth 2 (two) times daily.      . Carboxymethylcellulose Sodium (LUBRICANT EYE DROPS OP) Apply 2 drops to eye 4 (four) times daily as needed.    . citalopram (CELEXA) 40 MG tablet Take 1 tablet by mouth  daily 90 tablet 1  . estradiol (ESTRACE) 0.1 MG/GM vaginal cream Place 1 Applicatorful vaginally 3 (three) times a week. 42.5 g 12  . gemfibrozil (LOPID) 600 MG tablet Take one tablet by mouth  twice daily before meals 180 tablet 3  . glimepiride (AMARYL) 2 MG tablet Take one-half tablet by  mouth daily 45 tablet 5  . glucose blood (ONE TOUCH ULTRA TEST) test strip 1 each by Other route daily. Use as instructed 100 each 4  . HYDROcodone-acetaminophen (NORCO/VICODIN) 5-325 MG tablet 1 by mouth every 6 hours as needed for pain **DO NOT EXCEED 4 GM OF TYLENOL IN 24 HOURS**  120 tablet 0  . hydrocortisone 2.5 % cream Apply topically 2 (two) times daily. (Patient not taking: Reported on 12/25/2014) 60 g 0  . Lancets (ONETOUCH ULTRASOFT) lancets Use daily 100 each 6  . levothyroxine (SYNTHROID, LEVOTHROID) 150 MCG tablet Take 1 tablet by mouth once daily 90 tablet 2  . lisinopril (PRINIVIL,ZESTRIL) 10 MG tablet Take 1 tablet by mouth once daily 90 tablet 3  . loratadine (CLARITIN) 10 MG tablet Take 10 mg by mouth daily  as needed for allergies.    . meloxicam (MOBIC) 15 MG tablet Take 1 tablet by mouth  daily 90 tablet 1  . metFORMIN (GLUCOPHAGE) 1000 MG tablet take 1 tablet by mouth  twice daily with meal 180 tablet 3  . methocarbamol (ROBAXIN) 500 MG tablet Take 500 mg by mouth every 6 (six) hours as needed for muscle spasms.    . Multiple Vitamin (MULTIVITAMIN) tablet Take 1 tablet by mouth daily.      . Omega-3 Fatty Acids (FISH OIL) 1200 MG CAPS Take 2 capsules by mouth 2 (two) times daily.    Marland Kitchen omeprazole (PRILOSEC) 20 MG capsule Take 1 capsule by mouth two times daily 180 capsule 2  . pregabalin (LYRICA) 75 MG capsule TAKE ONE CAPSULE BY MOUTH ONE TIME DAILY 90 capsule 1  . tobramycin (TOBREX) 0.3 % ophthalmic solution Place 1 drop into the right eye every 6 (six) hours.     . traZODone (DESYREL) 100 MG tablet Take 2 tablets by mouth at  bedtime 180 tablet 1   No current facility-administered medications on file prior to visit.    There were no vitals taken for this visit.   1. Risk factors, based on past  M,S,F history- cardiovascular risk factors include hypertension diabetes and dyslipidemia  2.  Physical activities: Does have a history of arthritis and exogenous obesity but no real exercise limitations  3.  Depression/mood: History depression which has been well controlled on therapy 4.  Hearing: No deficits  5.  ADL's: Independent in all aspects of daily living  6.  Fall risk: Moderately high due to back pain.  She uses a walker intermittently for  the past 2 years  7.  Home safety: No problems identified  8.  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  Counseling: Weight loss exercise encouraged  10. Lab orders based on risk factors: Laboratory profile including hemoglobin A1c lipid profile and TSH will be reviewed  11. Referral : Followup GYN and Gen. surgery and ophthalmology; needs follow-up colonoscopy 12. Care plan: Weight loss exercise encouraged  13. Cognitive assessment: Alert and oriented with normal affect. No cognitive dysfunction.  14.  Preventive services will include annual clinical exams with screening labs.  The patient will have hemoglobin A1c is at least biannually.  Patient will be scheduled for follow-up colonoscopy.  Annual mammograms recommended  15.  Provider list includes ophthalmology primary care GI and neurosurgery      Review of Systems  Constitutional: Negative for fever, appetite change, fatigue and unexpected weight change.  HENT: Negative for congestion, dental problem, ear pain, hearing loss, mouth sores, nosebleeds, sinus pressure, sore throat, tinnitus, trouble swallowing and voice change.   Eyes: Negative for photophobia, pain, redness and visual disturbance.  Respiratory: Negative for cough, chest tightness and shortness of breath.   Cardiovascular: Negative for chest pain, palpitations and leg swelling.  Gastrointestinal: Negative for nausea, vomiting, abdominal pain, diarrhea, constipation, blood in stool, abdominal distention and rectal pain.  Genitourinary: Negative for dysuria, urgency, frequency, hematuria, flank pain, vaginal bleeding, vaginal discharge, difficulty urinating, genital sores, vaginal pain, menstrual problem and pelvic pain.  Musculoskeletal: Positive for back pain, arthralgias and gait problem. Negative for neck stiffness.  Skin: Negative for rash.  Neurological:  Positive for numbness. Negative for dizziness, syncope, speech difficulty, weakness, light-headedness and headaches.  Hematological: Negative for adenopathy. Does not bruise/bleed easily.  Psychiatric/Behavioral: Negative  for suicidal ideas, behavioral problems, self-injury, dysphoric mood and agitation. The patient is not nervous/anxious.        Objective:   Physical Exam  Constitutional: She is oriented to person, place, and time. She appears well-developed and well-nourished.  HENT:  Head: Normocephalic and atraumatic.  Right Ear: External ear normal.  Left Ear: External ear normal.  Mouth/Throat: Oropharynx is clear and moist.  Marked pharyngeal crowding  Eyes: Conjunctivae and EOM are normal.  Neck: Normal range of motion. Neck supple. No JVD present. No thyromegaly present.  Cardiovascular: Normal rate, regular rhythm, normal heart sounds and intact distal pulses.   No murmur heard. Posterior tibial pulses full. Dorsalis pedis pulses faint  Pulmonary/Chest: Effort normal and breath sounds normal. She has no wheezes. She has no rales.  Abdominal: Soft. Bowel sounds are normal. She exhibits no distension and no mass. There is no tenderness. There is no rebound and no guarding.  Obese. Umbilical hernia  Genitourinary: Vagina normal.  Musculoskeletal: Normal range of motion. She exhibits no edema or tenderness.  Neurological: She is alert and oriented to person, place, and time. She has normal reflexes. No cranial nerve deficit. She exhibits normal muscle tone. Coordination normal.  Decrease monofilament testing over the dorsal aspect of the foot  Skin: Skin is warm and dry. No rash noted.  Psychiatric: She has a normal mood and affect. Her behavior is normal.          Assessment & Plan:   Preventive health examination.  Follow colonoscopy November 2017 Diabetes. Weight loss exercise encouraged. Will check a hemoglobin A1c at least twice annually.  Well-controlled at present  with hemoglobin A1c 6.5 Hypertension stable Dyslipidemia.  Continue present regimen  Hypothyroidism.  TSH therapeutic.  No change in therapy Obesity.  Weight loss encouraged  Recheck 3 months

## 2015-01-28 NOTE — Progress Notes (Signed)
Pre visit review using our clinic review tool, if applicable. No additional management support is needed unless otherwise documented below in the visit note. 

## 2015-01-31 ENCOUNTER — Other Ambulatory Visit: Payer: Self-pay | Admitting: Internal Medicine

## 2015-01-31 ENCOUNTER — Other Ambulatory Visit: Payer: Self-pay | Admitting: *Deleted

## 2015-01-31 MED ORDER — ALPRAZOLAM 0.5 MG PO TABS: 0.5000 mg | ORAL_TABLET | Freq: Two times a day (BID) | ORAL | Status: DC

## 2015-01-31 NOTE — Telephone Encounter (Signed)
Pt last office visit 01/28/15 Pt last Rx refill 09/02/14 #180 with 1 refill

## 2015-01-31 NOTE — Telephone Encounter (Signed)
ok 

## 2015-02-05 ENCOUNTER — Ambulatory Visit
Admission: RE | Admit: 2015-02-05 | Discharge: 2015-02-05 | Disposition: A | Discharge status: Home / Self Care | Payer: Medicare Other | Source: Ambulatory Visit | Attending: Neurological Surgery | Admitting: Neurological Surgery

## 2015-02-05 DIAGNOSIS — M4806 Spinal stenosis, lumbar region: Secondary | ICD-10-CM | POA: Diagnosis not present

## 2015-02-05 MED ORDER — GADOBENATE DIMEGLUMINE 529 MG/ML IV SOLN
18.0000 mL | Freq: Once | INTRAVENOUS | Status: AC | PRN
  Administered 2015-02-05: 18 mL via INTRAVENOUS

## 2015-02-06 DIAGNOSIS — Z6835 Body mass index (BMI) 35.0-35.9, adult: Secondary | ICD-10-CM | POA: Diagnosis not present

## 2015-02-06 DIAGNOSIS — R030 Elevated blood-pressure reading, without diagnosis of hypertension: Secondary | ICD-10-CM | POA: Diagnosis not present

## 2015-02-06 DIAGNOSIS — M4806 Spinal stenosis, lumbar region: Secondary | ICD-10-CM | POA: Diagnosis not present

## 2015-02-07 ENCOUNTER — Other Ambulatory Visit: Payer: Self-pay | Admitting: Neurological Surgery

## 2015-02-18 ENCOUNTER — Telehealth: Payer: Self-pay | Admitting: Internal Medicine

## 2015-02-18 NOTE — Telephone Encounter (Signed)
Pt called saying she needs a Rx for Hydrocodone-Acetaminophen. She has three pills left. Please give her a call if needed.  Pt's ph# [phone] Thank you.

## 2015-02-19 MED ORDER — HYDROCODONE-ACETAMINOPHEN 5-325 MG PO TABS: ORAL_TABLET | ORAL | Status: DC

## 2015-02-19 NOTE — Telephone Encounter (Signed)
Left message on voicemail Rx ready for pickup, will be at the front desk. Rx printed and signed by Dr. Fry  

## 2015-02-21 DIAGNOSIS — H2511 Age-related nuclear cataract, right eye: Secondary | ICD-10-CM | POA: Diagnosis not present

## 2015-02-21 DIAGNOSIS — H18412 Arcus senilis, left eye: Secondary | ICD-10-CM | POA: Diagnosis not present

## 2015-02-21 DIAGNOSIS — H18411 Arcus senilis, right eye: Secondary | ICD-10-CM | POA: Diagnosis not present

## 2015-02-21 DIAGNOSIS — H353230 Exudative age-related macular degeneration, bilateral, stage unspecified: Secondary | ICD-10-CM | POA: Diagnosis not present

## 2015-02-21 DIAGNOSIS — H2512 Age-related nuclear cataract, left eye: Secondary | ICD-10-CM | POA: Diagnosis not present

## 2015-02-23 HISTORY — PX: LUMBAR FUSION: SHX111

## 2015-02-28 DIAGNOSIS — H43813 Vitreous degeneration, bilateral: Secondary | ICD-10-CM | POA: Diagnosis not present

## 2015-02-28 DIAGNOSIS — H353122 Nonexudative age-related macular degeneration, left eye, intermediate dry stage: Secondary | ICD-10-CM | POA: Diagnosis not present

## 2015-02-28 DIAGNOSIS — H35372 Puckering of macula, left eye: Secondary | ICD-10-CM | POA: Diagnosis not present

## 2015-02-28 DIAGNOSIS — H353211 Exudative age-related macular degeneration, right eye, with active choroidal neovascularization: Secondary | ICD-10-CM | POA: Diagnosis not present

## 2015-03-05 ENCOUNTER — Other Ambulatory Visit: Payer: Self-pay | Admitting: *Deleted

## 2015-03-06 MED ORDER — PREGABALIN 75 MG PO CAPS: ORAL_CAPSULE | ORAL | Status: DC

## 2015-03-11 ENCOUNTER — Other Ambulatory Visit (HOSPITAL_COMMUNITY): Payer: Medicare Other

## 2015-03-12 ENCOUNTER — Encounter (HOSPITAL_COMMUNITY): Payer: Self-pay

## 2015-03-12 ENCOUNTER — Encounter (HOSPITAL_COMMUNITY)
Admission: RE | Admit: 2015-03-12 | Discharge: 2015-03-12 | Disposition: A | Discharge status: Home / Self Care | Payer: Medicare Other | Source: Ambulatory Visit | Attending: Neurological Surgery | Admitting: Neurological Surgery

## 2015-03-12 DIAGNOSIS — Z01812 Encounter for preprocedural laboratory examination: Secondary | ICD-10-CM | POA: Insufficient documentation

## 2015-03-12 DIAGNOSIS — Z0183 Encounter for blood typing: Secondary | ICD-10-CM | POA: Insufficient documentation

## 2015-03-12 DIAGNOSIS — M4806 Spinal stenosis, lumbar region: Secondary | ICD-10-CM | POA: Insufficient documentation

## 2015-03-12 LAB — BASIC METABOLIC PANEL
Anion gap: 9 (ref 5–15)
BUN: 16 mg/dL (ref 6–20)
CALCIUM: 9.7 mg/dL (ref 8.9–10.3)
CO2: 28 mmol/L (ref 22–32)
CREATININE: 0.79 mg/dL (ref 0.44–1.00)
Chloride: 103 mmol/L (ref 101–111)
GFR calc non Af Amer: >60 mL/min (ref >60)
Glucose, Bld: 109 mg/dL — ABNORMAL HIGH (ref 65–99)
Potassium: 5.1 mmol/L (ref 3.5–5.1)
SODIUM: 140 mmol/L (ref 135–145)

## 2015-03-12 LAB — CBC
HEMATOCRIT: 36.7 % (ref 36.0–46.0)
Hemoglobin: 11.7 g/dL — ABNORMAL LOW (ref 12.0–15.0)
MCH: 25.1 pg — ABNORMAL LOW (ref 26.0–34.0)
MCHC: 31.9 g/dL (ref 30.0–36.0)
MCV: 78.6 fL (ref 78.0–100.0)
Platelets: 222 10*3/uL (ref 150–400)
RBC: 4.67 MIL/uL (ref 3.87–5.11)
RDW: 14.2 % (ref 11.5–15.5)
WBC: 6.6 10*3/uL (ref 4.0–10.5)

## 2015-03-12 LAB — SURGICAL PCR SCREEN
MRSA, PCR: NEGATIVE
STAPHYLOCOCCUS AUREUS: NEGATIVE

## 2015-03-12 LAB — TYPE AND SCREEN
ABO/RH(D): B POS
Antibody Screen: NEGATIVE

## 2015-03-12 LAB — GLUCOSE, CAPILLARY: Glucose-Capillary: 117 mg/dL — ABNORMAL HIGH (ref 65–99)

## 2015-03-12 NOTE — Pre-Procedure Instructions (Signed)
Katie Booker  03/12/2015      [facility] SERVICE - [place], [place] - [address] [place] [address] [place] [address] Phone: [phone] Fax: [phone]  [facility] - [place], [place] - [address] [place] [address] Phone: [phone] Fax: [phone]    Your procedure is scheduled on  Tuesday  03/18/15  Report to [facility] Admitting at 530 A.M.  Call this number if you have problems the morning of surgery:  [phone]   Remember:  Do not eat food or drink liquids after midnight.  Take these medicines the morning of surgery with A SIP OF WATER  ALPRAZOLAM, ATENOLOL (TENORMIN), CITALOPRAM, HYDROCODONE IF NEEDED, LEVOTHYROXINE, OMEPRAZOLE  (STOP ASPIRIN, COUMADIN, PLAVIX, EFFIENT, HERBAL MEDICINES , MELOXICAM/ MOBIC, MULTIVITAMIN, OMEGA3 FISH OIL) How to Manage Your Diabetes Before Surgery   Why is it important to control my blood sugar before and after surgery?   Improving blood sugar levels before and after surgery helps healing and can limit problems.  A way of improving blood sugar control is eating a healthy diet by:  - Eating less sugar and carbohydrates  - Increasing activity/exercise  - Talk with your doctor about reaching your blood sugar goals  High blood sugars (greater than 180 mg/dL) can raise your risk of infections and slow down your recovery so you will need to focus on controlling your diabetes during the weeks before surgery.  Make sure that the doctor who takes care of your diabetes knows about your planned surgery including the date and location.  How do I manage my blood sugars before surgery?   Check your blood sugar at least 4 times a day, 2 days before surgery to make sure that they are not too high or low.   Check your blood sugar the morning of your surgery when you wake up and every 2               hours until you get to the Short-Stay unit.  If  your blood sugar is less than 70 mg/dL, you will need to treat for low blood sugar by:  Treat a low blood sugar (less than 70 mg/dL) with 1/2 cup of clear juice (cranberry or apple), 4 glucose tablets, OR glucose gel.  Recheck blood sugar in 15 minutes after treatment (to make sure it is greater than 70 mg/dL).  If blood sugar is not greater than 70 mg/dL on re-check, call [phone] for further instructions.   Report your blood sugar to the Short-Stay nurse when you get to Short-Stay.  References:  [facility], 2007 "How to Manage your Diabetes Before and After Surgery".  What do I do about my diabetes medications?   Do not take oral diabetes medicines (pills) the morning of surgery    If your CBG is greater than 220 mg/dL, you may take 1/2 of your sliding scale (correction) dose of insulin.   For patients with "Insulin Pumps":  Contact your diabetes doctor for specific instructions before surgery.   Decrease basal insulin rates by 20% at midnight the night before surgery.  Note that if your surgery is planned to be longer than 2 hours, your insulin pump will be removed and intravenous (IV) insulin will be started and managed by the nurses and anesthesiologist.  You will be able to restart your insulin pump once you are awake and able to manage it.  Make sure to bring insulin pump supplies to the hospital with you in case your site needs to be changed.        Do not wear jewelry, make-up or nail polish.  Do not wear lotions, powders, or perfumes.  You may wear deodorant.  Do not shave 48 hours prior to surgery.  Men may shave face and neck.  Do not bring valuables to the hospital.  [facility] is not responsible for any belongings or valuables.  Contacts, dentures or bridgework may not be worn into surgery.  Leave your suitcase in the car.  After surgery it may be brought to your room.  For patients admitted to the hospital, discharge time will  be determined by your treatment team.  Patients discharged the day of surgery will not be allowed to drive home.   Name and phone number of your driver:   Special instructions:  SEE PREPARING FOR SURGERY  Please read over the following fact sheets that you were given. Pain Booklet, Coughing and Deep Breathing, Blood Transfusion Information, MRSA Information and Surgical Site Infection Prevention

## 2015-03-12 NOTE — Progress Notes (Signed)
REQUESTED STRESS TEST FROM [facility]. 

## 2015-03-13 LAB — HEMOGLOBIN A1C
Hgb A1c MFr Bld: 6.5 % — ABNORMAL HIGH (ref 4.8–5.6)
Mean Plasma Glucose: 140 mg/dL

## 2015-03-13 NOTE — Progress Notes (Signed)
Voice mail message left with Nichole at [facility] cardiology re-requesting stress test.

## 2015-03-17 MED ORDER — CEFAZOLIN SODIUM-DEXTROSE 2-3 GM-% IV SOLR
2.0000 g | INTRAVENOUS | Status: AC
  Administered 2015-03-18: 2 g via INTRAVENOUS
  Filled 2015-03-17: qty 50

## 2015-03-18 ENCOUNTER — Inpatient Hospital Stay (HOSPITAL_COMMUNITY): Payer: Medicare Other | Admitting: Certified Registered Nurse Anesthetist

## 2015-03-18 ENCOUNTER — Encounter (HOSPITAL_COMMUNITY): Payer: Self-pay | Admitting: *Deleted

## 2015-03-18 ENCOUNTER — Inpatient Hospital Stay (HOSPITAL_COMMUNITY)
Admission: RE | Admit: 2015-03-18 | Discharge: 2015-03-20 | DRG: 460 | Disposition: A | Discharge status: Skilled Nursing Facility | Payer: Medicare Other | Source: Ambulatory Visit | Attending: Neurological Surgery | Admitting: Neurological Surgery

## 2015-03-18 ENCOUNTER — Encounter (HOSPITAL_COMMUNITY)
Admission: RE | Disposition: A | Discharge status: Skilled Nursing Facility | Payer: Medicare Other | Source: Ambulatory Visit | Attending: Neurological Surgery

## 2015-03-18 ENCOUNTER — Inpatient Hospital Stay (HOSPITAL_COMMUNITY): Payer: Medicare Other

## 2015-03-18 DIAGNOSIS — Z885 Allergy status to narcotic agent status: Secondary | ICD-10-CM

## 2015-03-18 DIAGNOSIS — Z87891 Personal history of nicotine dependence: Secondary | ICD-10-CM

## 2015-03-18 DIAGNOSIS — F419 Anxiety disorder, unspecified: Secondary | ICD-10-CM | POA: Diagnosis present

## 2015-03-18 DIAGNOSIS — M1990 Unspecified osteoarthritis, unspecified site: Secondary | ICD-10-CM | POA: Diagnosis present

## 2015-03-18 DIAGNOSIS — M4806 Spinal stenosis, lumbar region: Secondary | ICD-10-CM | POA: Diagnosis not present

## 2015-03-18 DIAGNOSIS — G4700 Insomnia, unspecified: Secondary | ICD-10-CM | POA: Diagnosis present

## 2015-03-18 DIAGNOSIS — E039 Hypothyroidism, unspecified: Secondary | ICD-10-CM | POA: Diagnosis present

## 2015-03-18 DIAGNOSIS — F329 Major depressive disorder, single episode, unspecified: Secondary | ICD-10-CM | POA: Diagnosis present

## 2015-03-18 DIAGNOSIS — Z79899 Other long term (current) drug therapy: Secondary | ICD-10-CM

## 2015-03-18 DIAGNOSIS — I10 Essential (primary) hypertension: Secondary | ICD-10-CM | POA: Diagnosis present

## 2015-03-18 DIAGNOSIS — G4733 Obstructive sleep apnea (adult) (pediatric): Secondary | ICD-10-CM | POA: Diagnosis not present

## 2015-03-18 DIAGNOSIS — G8929 Other chronic pain: Secondary | ICD-10-CM | POA: Diagnosis not present

## 2015-03-18 DIAGNOSIS — K219 Gastro-esophageal reflux disease without esophagitis: Secondary | ICD-10-CM | POA: Diagnosis not present

## 2015-03-18 DIAGNOSIS — Z981 Arthrodesis status: Secondary | ICD-10-CM

## 2015-03-18 DIAGNOSIS — M549 Dorsalgia, unspecified: Secondary | ICD-10-CM | POA: Diagnosis not present

## 2015-03-18 DIAGNOSIS — Z888 Allergy status to other drugs, medicaments and biological substances status: Secondary | ICD-10-CM

## 2015-03-18 DIAGNOSIS — Z961 Presence of intraocular lens: Secondary | ICD-10-CM | POA: Diagnosis not present

## 2015-03-18 DIAGNOSIS — R2681 Unsteadiness on feet: Secondary | ICD-10-CM | POA: Diagnosis not present

## 2015-03-18 DIAGNOSIS — M419 Scoliosis, unspecified: Secondary | ICD-10-CM | POA: Diagnosis not present

## 2015-03-18 DIAGNOSIS — E119 Type 2 diabetes mellitus without complications: Secondary | ICD-10-CM | POA: Diagnosis not present

## 2015-03-18 DIAGNOSIS — E1142 Type 2 diabetes mellitus with diabetic polyneuropathy: Secondary | ICD-10-CM | POA: Diagnosis present

## 2015-03-18 DIAGNOSIS — E785 Hyperlipidemia, unspecified: Secondary | ICD-10-CM | POA: Diagnosis not present

## 2015-03-18 DIAGNOSIS — M545 Low back pain: Secondary | ICD-10-CM | POA: Diagnosis not present

## 2015-03-18 DIAGNOSIS — Z7984 Long term (current) use of oral hypoglycemic drugs: Secondary | ICD-10-CM | POA: Diagnosis not present

## 2015-03-18 DIAGNOSIS — M4326 Fusion of spine, lumbar region: Secondary | ICD-10-CM | POA: Diagnosis not present

## 2015-03-18 DIAGNOSIS — M4726 Other spondylosis with radiculopathy, lumbar region: Secondary | ICD-10-CM | POA: Diagnosis present

## 2015-03-18 DIAGNOSIS — G4730 Sleep apnea, unspecified: Secondary | ICD-10-CM | POA: Diagnosis not present

## 2015-03-18 DIAGNOSIS — D649 Anemia, unspecified: Secondary | ICD-10-CM | POA: Diagnosis present

## 2015-03-18 DIAGNOSIS — M4316 Spondylolisthesis, lumbar region: Secondary | ICD-10-CM | POA: Diagnosis not present

## 2015-03-18 DIAGNOSIS — Z419 Encounter for procedure for purposes other than remedying health state, unspecified: Secondary | ICD-10-CM

## 2015-03-18 LAB — GLUCOSE, CAPILLARY
GLUCOSE-CAPILLARY: 151 mg/dL — AB (ref 65–99)
Glucose-Capillary: 102 mg/dL — ABNORMAL HIGH (ref 65–99)
Glucose-Capillary: 143 mg/dL — ABNORMAL HIGH (ref 65–99)

## 2015-03-18 SURGERY — POSTERIOR LUMBAR FUSION 1 LEVEL
Anesthesia: General | Site: Back

## 2015-03-18 MED ORDER — ROCURONIUM BROMIDE 50 MG/5ML IV SOLN
INTRAVENOUS | Status: AC
  Filled 2015-03-18: qty 1

## 2015-03-18 MED ORDER — PHENOL 1.4 % MT LIQD: 1.0000 | OROMUCOSAL | Status: DC | PRN

## 2015-03-18 MED ORDER — SUFENTANIL CITRATE 50 MCG/ML IV SOLN
INTRAVENOUS | Status: DC | PRN
  Administered 2015-03-18: 20 ug via INTRAVENOUS
  Administered 2015-03-18: 10 ug via INTRAVENOUS
  Administered 2015-03-18: 5 ug via INTRAVENOUS
  Administered 2015-03-18: 10 ug via INTRAVENOUS
  Administered 2015-03-18: 5 ug via INTRAVENOUS

## 2015-03-18 MED ORDER — ONDANSETRON HCL 4 MG/2ML IJ SOLN
4.0000 mg | INTRAMUSCULAR | Status: DC | PRN
  Filled 2015-03-18: qty 2

## 2015-03-18 MED ORDER — LORATADINE 10 MG PO TABS: 10.0000 mg | ORAL_TABLET | Freq: Every day | ORAL | Status: DC | PRN

## 2015-03-18 MED ORDER — LISINOPRIL 10 MG PO TABS
10.0000 mg | ORAL_TABLET | Freq: Every day | ORAL | Status: DC
  Administered 2015-03-18 – 2015-03-20 (×3): 10 mg via ORAL
  Filled 2015-03-18 (×3): qty 1

## 2015-03-18 MED ORDER — SODIUM CHLORIDE 0.9 % IV SOLN: 250.0000 mL | INTRAVENOUS | Status: DC

## 2015-03-18 MED ORDER — SODIUM CHLORIDE 0.9 % IJ SOLN: 3.0000 mL | INTRAMUSCULAR | Status: DC | PRN

## 2015-03-18 MED ORDER — OXYCODONE-ACETAMINOPHEN 5-325 MG PO TABS
ORAL_TABLET | ORAL | Status: AC
  Filled 2015-03-18: qty 2

## 2015-03-18 MED ORDER — PROPOFOL 10 MG/ML IV BOLUS
INTRAVENOUS | Status: DC | PRN
  Administered 2015-03-18: 100 mg via INTRAVENOUS

## 2015-03-18 MED ORDER — ESTRADIOL 0.1 MG/GM VA CREA
1.0000 | TOPICAL_CREAM | VAGINAL | Status: DC
  Filled 2015-03-18: qty 42.5

## 2015-03-18 MED ORDER — SODIUM CHLORIDE 0.9 % IV SOLN
INTRAVENOUS | Status: DC
  Administered 2015-03-18 – 2015-03-19 (×2): via INTRAVENOUS

## 2015-03-18 MED ORDER — MIDAZOLAM HCL 2 MG/2ML IJ SOLN
INTRAMUSCULAR | Status: AC
  Filled 2015-03-18: qty 2

## 2015-03-18 MED ORDER — ONDANSETRON HCL 4 MG/2ML IJ SOLN
INTRAMUSCULAR | Status: DC | PRN
  Administered 2015-03-18: 4 mg via INTRAVENOUS

## 2015-03-18 MED ORDER — BUPIVACAINE HCL (PF) 0.5 % IJ SOLN
INTRAMUSCULAR | Status: DC | PRN
  Administered 2015-03-18: 20 mL
  Administered 2015-03-18: 10 mL

## 2015-03-18 MED ORDER — PROMETHAZINE HCL 25 MG/ML IJ SOLN: 6.2500 mg | INTRAMUSCULAR | Status: DC | PRN

## 2015-03-18 MED ORDER — LACTATED RINGERS IV SOLN
INTRAVENOUS | Status: DC | PRN
  Administered 2015-03-18 (×2): via INTRAVENOUS

## 2015-03-18 MED ORDER — SUFENTANIL CITRATE 50 MCG/ML IV SOLN
INTRAVENOUS | Status: AC
  Filled 2015-03-18: qty 1

## 2015-03-18 MED ORDER — GEMFIBROZIL 600 MG PO TABS
600.0000 mg | ORAL_TABLET | Freq: Two times a day (BID) | ORAL | Status: DC
  Administered 2015-03-18 – 2015-03-20 (×4): 600 mg via ORAL
  Filled 2015-03-18 (×6): qty 1

## 2015-03-18 MED ORDER — MAGNESIUM CITRATE PO SOLN: 1.0000 | Freq: Once | ORAL | Status: DC | PRN

## 2015-03-18 MED ORDER — LIDOCAINE-EPINEPHRINE 1 %-1:100000 IJ SOLN
INTRAMUSCULAR | Status: DC | PRN
  Administered 2015-03-18: 10 mL

## 2015-03-18 MED ORDER — POLYVINYL ALCOHOL 1.4 % OP SOLN
1.0000 [drp] | Freq: Four times a day (QID) | OPHTHALMIC | Status: DC | PRN
  Filled 2015-03-18: qty 15

## 2015-03-18 MED ORDER — PHENYLEPHRINE HCL 10 MG/ML IJ SOLN
INTRAMUSCULAR | Status: DC | PRN
  Administered 2015-03-18: 40 ug via INTRAVENOUS
  Administered 2015-03-18 (×3): 80 ug via INTRAVENOUS
  Administered 2015-03-18: 40 ug via INTRAVENOUS
  Administered 2015-03-18: 80 ug via INTRAVENOUS

## 2015-03-18 MED ORDER — CITALOPRAM HYDROBROMIDE 40 MG PO TABS
40.0000 mg | ORAL_TABLET | Freq: Every day | ORAL | Status: DC
  Administered 2015-03-19 – 2015-03-20 (×2): 40 mg via ORAL
  Filled 2015-03-18 (×2): qty 1

## 2015-03-18 MED ORDER — BISACODYL 10 MG RE SUPP: 10.0000 mg | Freq: Every day | RECTAL | Status: DC | PRN

## 2015-03-18 MED ORDER — METHOCARBAMOL 1000 MG/10ML IJ SOLN
500.0000 mg | Freq: Four times a day (QID) | INTRAVENOUS | Status: DC | PRN
  Filled 2015-03-18: qty 5

## 2015-03-18 MED ORDER — MIDAZOLAM HCL 5 MG/5ML IJ SOLN
INTRAMUSCULAR | Status: DC | PRN
  Administered 2015-03-18: 2 mg via INTRAVENOUS

## 2015-03-18 MED ORDER — MIDAZOLAM HCL 2 MG/2ML IJ SOLN
0.5000 mg | Freq: Once | INTRAMUSCULAR | Status: AC | PRN
  Administered 2015-03-18: 0.5 mg via INTRAVENOUS

## 2015-03-18 MED ORDER — METHOCARBAMOL 500 MG PO TABS
500.0000 mg | ORAL_TABLET | Freq: Four times a day (QID) | ORAL | Status: DC | PRN
  Administered 2015-03-19: 500 mg via ORAL
  Filled 2015-03-18 (×3): qty 1

## 2015-03-18 MED ORDER — TRAZODONE HCL 100 MG PO TABS: 100.0000 mg | ORAL_TABLET | Freq: Every evening | ORAL | Status: DC | PRN

## 2015-03-18 MED ORDER — OXYCODONE-ACETAMINOPHEN 5-325 MG PO TABS
1.0000 | ORAL_TABLET | ORAL | Status: DC | PRN
  Administered 2015-03-18 – 2015-03-20 (×9): 2 via ORAL
  Filled 2015-03-18 (×8): qty 2

## 2015-03-18 MED ORDER — PANTOPRAZOLE SODIUM 40 MG PO TBEC
40.0000 mg | DELAYED_RELEASE_TABLET | Freq: Every day | ORAL | Status: DC
  Administered 2015-03-19 – 2015-03-20 (×2): 40 mg via ORAL
  Filled 2015-03-18 (×2): qty 1

## 2015-03-18 MED ORDER — ACETAMINOPHEN 650 MG RE SUPP: 650.0000 mg | RECTAL | Status: DC | PRN

## 2015-03-18 MED ORDER — HYDROCORTISONE 2.5 % EX CREA: 1.0000 "application " | TOPICAL_CREAM | Freq: Two times a day (BID) | CUTANEOUS | Status: DC | PRN

## 2015-03-18 MED ORDER — 0.9 % SODIUM CHLORIDE (POUR BTL) OPTIME
TOPICAL | Status: DC | PRN
  Administered 2015-03-18: 1000 mL

## 2015-03-18 MED ORDER — FENTANYL CITRATE (PF) 100 MCG/2ML IJ SOLN
INTRAMUSCULAR | Status: AC
  Filled 2015-03-18: qty 2

## 2015-03-18 MED ORDER — FENTANYL CITRATE (PF) 100 MCG/2ML IJ SOLN
25.0000 ug | INTRAMUSCULAR | Status: AC | PRN
  Administered 2015-03-18 (×2): 25 ug via INTRAVENOUS
  Administered 2015-03-18 (×2): 50 ug via INTRAVENOUS
  Administered 2015-03-18 (×2): 25 ug via INTRAVENOUS

## 2015-03-18 MED ORDER — FENTANYL CITRATE (PF) 100 MCG/2ML IJ SOLN
INTRAMUSCULAR | Status: AC
Start: 2015-03-18 — End: 2015-03-19
  Filled 2015-03-18: qty 2

## 2015-03-18 MED ORDER — THROMBIN 5000 UNITS EX SOLR
OROMUCOSAL | Status: DC | PRN
  Administered 2015-03-18: 07:00:00 via TOPICAL

## 2015-03-18 MED ORDER — DOCUSATE SODIUM 100 MG PO CAPS
100.0000 mg | ORAL_CAPSULE | Freq: Two times a day (BID) | ORAL | Status: DC
  Administered 2015-03-18 – 2015-03-20 (×4): 100 mg via ORAL
  Filled 2015-03-18 (×4): qty 1

## 2015-03-18 MED ORDER — LIDOCAINE HCL (CARDIAC) 20 MG/ML IV SOLN
INTRAVENOUS | Status: DC | PRN
  Administered 2015-03-18: 30 mg via INTRAVENOUS

## 2015-03-18 MED ORDER — SODIUM CHLORIDE 0.9 % IJ SOLN
3.0000 mL | Freq: Two times a day (BID) | INTRAMUSCULAR | Status: DC
  Administered 2015-03-19 (×2): 3 mL via INTRAVENOUS

## 2015-03-18 MED ORDER — MENTHOL 3 MG MT LOZG: 1.0000 | LOZENGE | OROMUCOSAL | Status: DC | PRN

## 2015-03-18 MED ORDER — DIAZEPAM 5 MG/ML IJ SOLN
5.0000 mg | Freq: Four times a day (QID) | INTRAMUSCULAR | Status: DC | PRN
  Administered 2015-03-18: 10 mg via INTRAVENOUS
  Filled 2015-03-18: qty 2

## 2015-03-18 MED ORDER — PROPOFOL 10 MG/ML IV BOLUS
INTRAVENOUS | Status: AC
  Filled 2015-03-18: qty 20

## 2015-03-18 MED ORDER — SODIUM CHLORIDE 0.9 % IR SOLN
Status: DC | PRN
  Administered 2015-03-18: 07:00:00

## 2015-03-18 MED ORDER — MEPERIDINE HCL 25 MG/ML IJ SOLN: 6.2500 mg | INTRAMUSCULAR | Status: DC | PRN

## 2015-03-18 MED ORDER — DEXAMETHASONE SODIUM PHOSPHATE 4 MG/ML IJ SOLN
INTRAMUSCULAR | Status: DC | PRN
  Administered 2015-03-18: 6 mg via INTRAVENOUS

## 2015-03-18 MED ORDER — ALUM & MAG HYDROXIDE-SIMETH 200-200-20 MG/5ML PO SUSP
30.0000 mL | Freq: Four times a day (QID) | ORAL | Status: DC | PRN
  Filled 2015-03-18: qty 30

## 2015-03-18 MED ORDER — SUCCINYLCHOLINE CHLORIDE 20 MG/ML IJ SOLN
INTRAMUSCULAR | Status: AC
  Filled 2015-03-18: qty 1

## 2015-03-18 MED ORDER — HYDROCODONE-ACETAMINOPHEN 5-325 MG PO TABS: 1.0000 | ORAL_TABLET | ORAL | Status: DC | PRN

## 2015-03-18 MED ORDER — GLIMEPIRIDE 2 MG PO TABS
2.0000 mg | ORAL_TABLET | Freq: Every day | ORAL | Status: DC
  Administered 2015-03-19 – 2015-03-20 (×2): 2 mg via ORAL
  Filled 2015-03-18 (×2): qty 1

## 2015-03-18 MED ORDER — ACETAMINOPHEN 325 MG PO TABS: 650.0000 mg | ORAL_TABLET | ORAL | Status: DC | PRN

## 2015-03-18 MED ORDER — METHOCARBAMOL 500 MG PO TABS
500.0000 mg | ORAL_TABLET | Freq: Four times a day (QID) | ORAL | Status: DC | PRN
  Administered 2015-03-18 – 2015-03-20 (×3): 500 mg via ORAL
  Filled 2015-03-18 (×2): qty 1

## 2015-03-18 MED ORDER — METHOCARBAMOL 500 MG PO TABS
ORAL_TABLET | ORAL | Status: AC
  Filled 2015-03-18: qty 1

## 2015-03-18 MED ORDER — POLYETHYLENE GLYCOL 3350 17 G PO PACK: 17.0000 g | PACK | Freq: Every day | ORAL | Status: DC | PRN

## 2015-03-18 MED ORDER — TOBRAMYCIN 0.3 % OP SOLN: 1.0000 [drp] | OPHTHALMIC | Status: DC | PRN

## 2015-03-18 MED ORDER — LIDOCAINE HCL (CARDIAC) 20 MG/ML IV SOLN
INTRAVENOUS | Status: AC
  Filled 2015-03-18: qty 5

## 2015-03-18 MED ORDER — METFORMIN HCL 500 MG PO TABS
1000.0000 mg | ORAL_TABLET | Freq: Two times a day (BID) | ORAL | Status: DC
  Administered 2015-03-18 – 2015-03-20 (×4): 1000 mg via ORAL
  Filled 2015-03-18 (×4): qty 2

## 2015-03-18 MED ORDER — FENTANYL CITRATE (PF) 100 MCG/2ML IJ SOLN
INTRAMUSCULAR | Status: AC
  Administered 2015-03-18: 25 ug via INTRAVENOUS
  Filled 2015-03-18: qty 2

## 2015-03-18 MED ORDER — ALPRAZOLAM 0.5 MG PO TABS
0.5000 mg | ORAL_TABLET | Freq: Two times a day (BID) | ORAL | Status: DC
  Administered 2015-03-18 – 2015-03-20 (×5): 0.5 mg via ORAL
  Filled 2015-03-18 (×4): qty 1

## 2015-03-18 MED ORDER — THROMBIN 20000 UNITS EX SOLR
CUTANEOUS | Status: DC | PRN
  Administered 2015-03-18: 07:00:00 via TOPICAL

## 2015-03-18 MED ORDER — PREGABALIN 75 MG PO CAPS
75.0000 mg | ORAL_CAPSULE | Freq: Two times a day (BID) | ORAL | Status: DC
  Administered 2015-03-18 – 2015-03-20 (×4): 75 mg via ORAL
  Filled 2015-03-18 (×4): qty 1

## 2015-03-18 MED ORDER — ONDANSETRON HCL 4 MG/2ML IJ SOLN
INTRAMUSCULAR | Status: AC
  Filled 2015-03-18: qty 2

## 2015-03-18 MED ORDER — ATORVASTATIN CALCIUM 40 MG PO TABS
40.0000 mg | ORAL_TABLET | Freq: Every day | ORAL | Status: DC
  Administered 2015-03-18 – 2015-03-19 (×2): 40 mg via ORAL
  Filled 2015-03-18 (×2): qty 1

## 2015-03-18 MED ORDER — ROCURONIUM BROMIDE 100 MG/10ML IV SOLN
INTRAVENOUS | Status: DC | PRN
  Administered 2015-03-18: 50 mg via INTRAVENOUS

## 2015-03-18 MED ORDER — LEVOTHYROXINE SODIUM 75 MCG PO TABS
150.0000 ug | ORAL_TABLET | Freq: Every day | ORAL | Status: DC
  Administered 2015-03-19 – 2015-03-20 (×2): 150 ug via ORAL
  Filled 2015-03-18 (×2): qty 2

## 2015-03-18 MED ORDER — SODIUM CHLORIDE 0.9 % IJ SOLN
INTRAMUSCULAR | Status: AC
  Filled 2015-03-18: qty 10

## 2015-03-18 MED ORDER — ATENOLOL 25 MG PO TABS
25.0000 mg | ORAL_TABLET | Freq: Every day | ORAL | Status: DC
  Administered 2015-03-19 – 2015-03-20 (×2): 25 mg via ORAL
  Filled 2015-03-18 (×2): qty 1

## 2015-03-18 MED ORDER — FENTANYL CITRATE (PF) 100 MCG/2ML IJ SOLN
25.0000 ug | INTRAMUSCULAR | Status: DC | PRN
Start: 2015-03-18 — End: 2015-03-20
  Administered 2015-03-18 – 2015-03-19 (×2): 25 ug via INTRAVENOUS
  Filled 2015-03-18 (×2): qty 2

## 2015-03-18 MED ORDER — DIAZEPAM 5 MG PO TABS
5.0000 mg | ORAL_TABLET | Freq: Four times a day (QID) | ORAL | Status: DC | PRN
  Administered 2015-03-19 – 2015-03-20 (×3): 10 mg via ORAL
  Filled 2015-03-18 (×3): qty 2

## 2015-03-18 MED ORDER — SENNA 8.6 MG PO TABS
1.0000 | ORAL_TABLET | Freq: Two times a day (BID) | ORAL | Status: DC
  Administered 2015-03-18 – 2015-03-20 (×4): 8.6 mg via ORAL
  Filled 2015-03-18 (×4): qty 1

## 2015-03-18 SURGICAL SUPPLY — 61 items
BAG DECANTER FOR FLEXI CONT (MISCELLANEOUS) ×2 IMPLANT
BLADE CLIPPER SURG (BLADE) IMPLANT
BUR MATCHSTICK NEURO 3.0 LAGG (BURR) ×2 IMPLANT
CAGE COROENT MP 8X23 (Cage) ×4 IMPLANT
CANISTER SUCT 3000ML PPV (MISCELLANEOUS) ×2 IMPLANT
CHLORAPREP W/TINT 26ML (MISCELLANEOUS) ×2 IMPLANT
CONNECTOR RELINE ROTATING 5-6 (Connector) ×4 IMPLANT
CONT SPEC 4OZ CLIKSEAL STRL BL (MISCELLANEOUS) ×2 IMPLANT
COVER BACK TABLE 60X90IN (DRAPES) ×2 IMPLANT
DECANTER SPIKE VIAL GLASS SM (MISCELLANEOUS) ×2 IMPLANT
DERMABOND ADVANCED (GAUZE/BANDAGES/DRESSINGS) ×1
DERMABOND ADVANCED .7 DNX12 (GAUZE/BANDAGES/DRESSINGS) ×1 IMPLANT
DRAPE C-ARM 42X72 X-RAY (DRAPES) ×4 IMPLANT
DRAPE LAPAROTOMY 100X72X124 (DRAPES) ×2 IMPLANT
DRAPE POUCH INSTRU U-SHP 10X18 (DRAPES) ×2 IMPLANT
DRAPE PROXIMA HALF (DRAPES) IMPLANT
DURAPREP 26ML APPLICATOR (WOUND CARE) ×2 IMPLANT
ELECT REM PT RETURN 9FT ADLT (ELECTROSURGICAL) ×2
ELECTRODE REM PT RTRN 9FT ADLT (ELECTROSURGICAL) ×1 IMPLANT
GAUZE SPONGE 4X4 12PLY STRL (GAUZE/BANDAGES/DRESSINGS) ×2 IMPLANT
GAUZE SPONGE 4X4 16PLY XRAY LF (GAUZE/BANDAGES/DRESSINGS) IMPLANT
GLOVE BIOGEL PI IND STRL 8.5 (GLOVE) ×2 IMPLANT
GLOVE BIOGEL PI INDICATOR 8.5 (GLOVE) ×2
GLOVE ECLIPSE 8.5 STRL (GLOVE) ×4 IMPLANT
GLOVE ECLIPSE 9.0 STRL (GLOVE) ×2 IMPLANT
GLOVE EXAM NITRILE LRG STRL (GLOVE) IMPLANT
GLOVE EXAM NITRILE MD LF STRL (GLOVE) IMPLANT
GLOVE EXAM NITRILE XL STR (GLOVE) IMPLANT
GLOVE EXAM NITRILE XS STR PU (GLOVE) IMPLANT
GLOVE INDICATOR 7.0 STRL GRN (GLOVE) ×2 IMPLANT
GLOVE INDICATOR 7.5 STRL GRN (GLOVE) ×2 IMPLANT
GLOVE SURG SS PI 6.5 STRL IVOR (GLOVE) ×6 IMPLANT
GOWN STRL REUS W/ TWL LRG LVL3 (GOWN DISPOSABLE) IMPLANT
GOWN STRL REUS W/ TWL XL LVL3 (GOWN DISPOSABLE) ×2 IMPLANT
GOWN STRL REUS W/TWL 2XL LVL3 (GOWN DISPOSABLE) ×4 IMPLANT
GOWN STRL REUS W/TWL LRG LVL3 (GOWN DISPOSABLE)
GOWN STRL REUS W/TWL XL LVL3 (GOWN DISPOSABLE) ×2
HEMOSTAT POWDER KIT SURGIFOAM (HEMOSTASIS) ×2 IMPLANT
KIT BASIN OR (CUSTOM PROCEDURE TRAY) ×2 IMPLANT
KIT INFUSE SMALL (Orthopedic Implant) ×2 IMPLANT
KIT ROOM TURNOVER OR (KITS) ×2 IMPLANT
NEEDLE HYPO 22GX1.5 SAFETY (NEEDLE) ×2 IMPLANT
NS IRRIG 1000ML POUR BTL (IV SOLUTION) ×2 IMPLANT
PACK LAMINECTOMY NEURO (CUSTOM PROCEDURE TRAY) ×2 IMPLANT
PAD ARMBOARD 7.5X6 YLW CONV (MISCELLANEOUS) ×6 IMPLANT
PATTIES SURGICAL .5 X1 (DISPOSABLE) ×2 IMPLANT
ROD RELINE TI LATERAL MED OFF (Rod) ×4 IMPLANT
SCREW LOCK RELINE 5.5 TULIP (Screw) ×12 IMPLANT
SCREW RELINE-O POLY 6.5X45 (Screw) ×4 IMPLANT
SPONGE LAP 4X18 X RAY DECT (DISPOSABLE) IMPLANT
SPONGE SURGIFOAM ABS GEL 100 (HEMOSTASIS) ×2 IMPLANT
SUT VIC AB 1 CT1 18XBRD ANBCTR (SUTURE) ×1 IMPLANT
SUT VIC AB 1 CT1 8-18 (SUTURE) ×1
SUT VIC AB 2-0 CP2 18 (SUTURE) ×2 IMPLANT
SUT VIC AB 3-0 SH 8-18 (SUTURE) ×2 IMPLANT
SYR 3ML LL SCALE MARK (SYRINGE) ×8 IMPLANT
TOWEL OR 17X24 6PK STRL BLUE (TOWEL DISPOSABLE) ×2 IMPLANT
TOWEL OR 17X26 10 PK STRL BLUE (TOWEL DISPOSABLE) ×2 IMPLANT
TRAP SPECIMEN MUCOUS 40CC (MISCELLANEOUS) ×2 IMPLANT
TRAY FOLEY W/METER SILVER 14FR (SET/KITS/TRAYS/PACK) ×2 IMPLANT
WATER STERILE IRR 1000ML POUR (IV SOLUTION) ×2 IMPLANT

## 2015-03-18 NOTE — H&P (Signed)
Katie Booker is an 70 y.o. female.   Chief Complaint: Severe back pain and leg weakness HPI: Katie Booker is a 70 year old individual who's had significant back and bilateral lower extremity pain. She has evidence of severe stenosis at L1-L2 having had a previous fusion L2-L5. She's been advised regarding the need of surgical decompression and stabilization of the upper joint. She is now admitted for this procedure.  Past Medical History  Diagnosis Date  . HYPERLIPIDEMIA 09/18/2009    takes Lipitor daily  . HYPOTHYROIDISM 09/18/2009  . OSTEOARTHRITIS 09/18/2009  . Obesity   . Hemorrhoids   . Anal fissure   . Anxiety     takes Xanax prn sleep and anxiety  . DEPRESSION 09/18/2009    takes Celexa daily  . HYPERTENSION 09/18/2009    takes Atenolol daily and Lisinopril  . Histoplasmosis     hx of  . Shortness of breath     with exertion  . OSA (obstructive sleep apnea)     has a CPAP and sleep study report in epic from 2009  . History of bronchitis     last time around 1990  . Headache(784.0)     sinus HA  . Peripheral neuropathy (HCC)     takes Lyrica daily  . Joint pain   . Joint swelling   . Chronic back pain     scoliosis/stenosis/spondylosis  . Skin spots, red     right above both elbows;pt states always there  . GERD 09/18/2009    takes Omeprazole daily  . Umbilical hernia   . ANEMIA-NOS 09/18/2009    takes Ferrous Sulfate daily  . History of colon polyps   . Urinary frequency   . Urinary urgency   . Insomnia     takes trazodone and ambien nightly  . Cancer (HCC)     basal cell left cheek  . DIABETES MELLITUS, TYPE II 09/18/2009    takes Amaryl daily and Metformin    Past Surgical History  Procedure Laterality Date  . Tubal fulgaration  1991  . Colonoscopy  2007  . Esophagogastroduodenoscopy  2007  . Hemorrhoid surgery  03/23/2011    Procedure: HEMORRHOIDECTOMY;  Surgeon: Clovis Pu. Cornett, MD;  Location: [facility];  Service: General;   Laterality: N/A;  lateral internal sphincterotomy and hemorrhoidectomy  . Sphincterotomy    . Thoracic discectomy N/A 12/15/2012    Procedure: Thoracic ten-eleven Thoracic laminectomy;  Surgeon: Barnett Abu, MD;  Location: [facility];  Service: Neurosurgery;  Laterality: N/A;  Thoracic ten-eleven Thoracic laminectomy  . Back surgery    . Anterior cervical decomp/discectomy fusion N/A 09/02/2014    Procedure: Cervical five- six  Anterior cervical decompression fusion;  Surgeon: Barnett Abu, MD;  Location: [facility];  Service: Neurosurgery;  Laterality: N/A;  C5-6 Anterior cervical decompression/diskectomy/fusion  . Carpal tunnel release      RIGHT      2016     Family History  Problem Relation Age of Onset  . Diabetes Father   . COPD Father   . Cancer Maternal Grandfather     stomach   Social History:  reports that she quit smoking about 11 years ago. Her smoking use included Cigarettes. She has a 20 pack-year smoking history. She has never used smokeless tobacco. She reports that she drinks alcohol. She reports that she does not use illicit drugs.  Allergies:  Allergies  Allergen Reactions  . Duraprep [Tersaseptic] Itching and Rash    Irritation everywhere  prep was used  . Hydromorphone Hcl     Makes crazy  . Morphine And Related Other (See Comments)    hallucinations    Medications Prior to Admission  Medication Sig Dispense Refill  . ALPRAZolam (XANAX) 0.5 MG tablet Take 1 tablet (0.5 mg total) by mouth 2 (two) times daily. 180 tablet 1  . atenolol (TENORMIN) 25 MG tablet Take 1 tablet by mouth  daily 90 tablet 1  . atorvastatin (LIPITOR) 40 MG tablet Take 1 tablet by mouth once daily 90 tablet 2  . beta carotene w/minerals (OCUVITE) tablet Take 1 tablet by mouth daily.    . Calcium Carbonate-Vit D-Min (GNP CALCIUM 600 PLUS D/MINERAL) 600-400 MG-UNIT TABS Take 1 tablet by mouth 2 (two) times daily.      . Carboxymethylcellulose Sodium (LUBRICANT EYE DROPS OP) Apply 2  drops to eye 4 (four) times daily as needed.    . citalopram (CELEXA) 40 MG tablet Take 1 tablet by mouth  daily 90 tablet 1  . gemfibrozil (LOPID) 600 MG tablet Take one tablet by mouth  twice daily before meals 180 tablet 3  . glimepiride (AMARYL) 2 MG tablet Take one-half tablet by  mouth daily 45 tablet 5  . glucose blood (ONE TOUCH ULTRA TEST) test strip 1 each by Other route daily. Use as instructed 100 each 4  . HYDROcodone-acetaminophen (NORCO/VICODIN) 5-325 MG tablet 1 by mouth every 6 hours as needed for pain **DO NOT EXCEED 4 GM OF TYLENOL IN 24 HOURS** 120 tablet 0  . hydrocortisone 2.5 % cream Apply topically 2 (two) times daily. (Patient taking differently: Apply 1 application topically 2 (two) times daily as needed. ) 60 g 0  . Lancets (ONETOUCH ULTRASOFT) lancets Use daily 100 each 6  . levothyroxine (SYNTHROID, LEVOTHROID) 150 MCG tablet Take 1 tablet by mouth once daily 90 tablet 2  . lisinopril (PRINIVIL,ZESTRIL) 10 MG tablet Take 1 tablet by mouth once daily 90 tablet 3  . meloxicam (MOBIC) 15 MG tablet Take 1 tablet by mouth  daily 90 tablet 1  . metFORMIN (GLUCOPHAGE) 1000 MG tablet take 1 tablet by mouth  twice daily with meal 180 tablet 3  . methocarbamol (ROBAXIN) 500 MG tablet Take 500 mg by mouth every 6 (six) hours as needed for muscle spasms.    . Multiple Vitamin (MULTIVITAMIN) tablet Take 1 tablet by mouth daily.      . Omega-3 Fatty Acids (FISH OIL) 1200 MG CAPS Take 2 capsules by mouth 2 (two) times daily.    Marland Kitchen omeprazole (PRILOSEC) 20 MG capsule Take 1 capsule by mouth two times daily 180 capsule 2  . pregabalin (LYRICA) 75 MG capsule TAKE ONE CAPSULE BY MOUTH ONE TIME DAILY 90 capsule 1  . tobramycin (TOBREX) 0.3 % ophthalmic solution Place 1 drop into the right eye See admin instructions. Only uses when getting eye injection. Only use the day before, the day of, and the day after. For [facility] Treatment.    . traZODone (DESYREL) 100 MG tablet Take 2 tablets by mouth  at  bedtime 180 tablet 1  . estradiol (ESTRACE) 0.1 MG/GM vaginal cream Place 1 Applicatorful vaginally 3 (three) times a week. 42.5 g 12  . loratadine (CLARITIN) 10 MG tablet Take 10 mg by mouth daily as needed for allergies.      Results for orders placed or performed during the hospital encounter of 03/18/15 (from the past 48 hour(s))  Glucose, capillary     Status: Abnormal  Collection Time: 03/18/15  6:30 AM  Result Value Ref Range   Glucose-Capillary 102 (H) 65 - 99 mg/dL   No results found.  Review of Systems  Constitutional: Positive for malaise/fatigue.  HENT: Negative.   Eyes: Negative.   Respiratory: Negative.   Gastrointestinal: Negative.   Genitourinary: Negative.   Musculoskeletal: Positive for back pain.  Skin: Negative.   Neurological: Positive for tingling, tremors, focal weakness and weakness.  Endo/Heme/Allergies: Negative.   Psychiatric/Behavioral: Positive for depression.    Blood pressure 125/66, pulse 86, temperature 98.9 F (37.2 C), temperature source Oral, resp. rate 20, height  (1.575 m), weight 87.544 kg (193 lb), SpO2 94 %. Physical Exam  Constitutional: She appears well-developed and well-nourished.  Morbid obesity  HENT:  Head: Normocephalic and atraumatic.  Eyes: Conjunctivae are normal. Pupils are equal, round, and reactive to light.  Neck: Normal range of motion. Neck supple.  Cardiovascular: Normal rate and regular rhythm.   Respiratory: Effort normal and breath sounds normal.  GI: Soft. Bowel sounds are normal.  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  Decompression and fusion L1-L2  Tanika Bracco J 03/18/2015, 7:27 AM

## 2015-03-18 NOTE — Progress Notes (Signed)
Utilization review completed.  

## 2015-03-18 NOTE — Anesthesia Procedure Notes (Signed)
Procedure Name: Intubation Date/Time: 03/18/2015 7:40 AM Performed by: Charm Barges, Symon Norwood R Pre-anesthesia Checklist: Patient identified, Emergency Drugs available, Suction available, Patient being monitored and Timeout performed Patient Re-evaluated:Patient Re-evaluated prior to inductionOxygen Delivery Method: Circle system utilized Preoxygenation: Pre-oxygenation with 100% oxygen Intubation Type: IV induction Ventilation: Mask ventilation without difficulty and Oral airway inserted - appropriate to patient size Laryngoscope Size: Glidescope Grade View: Grade I Tube type: Oral Tube size: 7.5 mm Number of attempts: 1 Airway Equipment and Method: Rigid stylet and Video-laryngoscopy Placement Confirmation: ETT inserted through vocal cords under direct vision,  positive ETCO2 and breath sounds checked- equal and bilateral Secured at: 21 cm Tube secured with: Tape Dental Injury: Teeth and Oropharynx as per pre-operative assessment

## 2015-03-18 NOTE — Anesthesia Preprocedure Evaluation (Addendum)
Anesthesia Evaluation  Patient identified by MRN, date of birth, ID band Patient awake    Reviewed: Allergy & Precautions, NPO status , Patient's Chart, lab work & pertinent test results, reviewed documented beta blocker date and time   History of Anesthesia Complications Negative for: history of anesthetic complications  Airway Mallampati: IV  TM Distance: >3 FB Neck ROM: Full    Dental  (+) Dental Advisory Given   Pulmonary sleep apnea (does not use CPAP) , former smoker (quit '06),    breath sounds clear to auscultation       Cardiovascular hypertension, Pt. on medications and Pt. on home beta blockers (-) angina Rhythm:Regular Rate:Normal  '14 ECHO: EF 55-60%, valves OK   Neuro/Psych Anxiety Depression Chronic back pain    GI/Hepatic Neg liver ROS, GERD  Medicated and Controlled,  Endo/Other  diabetes (glu 102), Oral Hypoglycemic AgentsHypothyroidism Morbid obesity  Renal/GU negative Renal ROS     Musculoskeletal  (+) Arthritis , Osteoarthritis,    Abdominal (+) + obese,   Peds  Hematology negative hematology ROS (+)   Anesthesia Other Findings   Reproductive/Obstetrics                            Anesthesia Physical Anesthesia Plan  ASA: III  Anesthesia Plan: General   Post-op Pain Management:    Induction: Intravenous  Airway Management Planned: Oral ETT and Video Laryngoscope Planned  Additional Equipment:   Intra-op Plan:   Post-operative Plan: Extubation in OR  Informed Consent: I have reviewed the patients History and Physical, chart, labs and discussed the procedure including the risks, benefits and alternatives for the proposed anesthesia with the patient or authorized representative who has indicated his/her understanding and acceptance.   Dental advisory given  Plan Discussed with: CRNA and Surgeon  Anesthesia Plan Comments: (Plan routine monitors, GETA)         Anesthesia Quick Evaluation

## 2015-03-18 NOTE — Progress Notes (Signed)
Patient admitted to room 5C19 at this time. Alert and in stable condition.

## 2015-03-18 NOTE — Op Note (Signed)
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 Status post arthrodesis L2-L5 Procedure: Decompression of L1 and L2 with decompression of central canal and L1 nerve roots superiorly with more work and required for simple posterior interbody technique. Posterior lumbar interbody arthrodesis L1-L2 with peek spacers local autograft and infuse segmental fixation with pedicle screws attaching L1 to previous L2-L5 construct and posterior lateral arthrodesis with infuse.  Surgeon: Barnett Abu First assistant: Julio Sicks M.D. Anesthesia: Gen. endotracheal  Indications: Katie Booker is a 70 year old individual is had a previous decompression fusion from L2 to L5. In the past year she's developed progressive worsening pain in the back with radiation to the lower extremities and weakness in both her legs. She has some underlying morbid obesity but it was noted that she has a spondylolisthesis at L1-L2 with severe retrolisthesis causing stenosis right just below the level of the conus. She is advised regarding the need for surgical decompression and arthrodesis.  Procedure: The patient was brought to the operating room supine on a stretcher. After the smooth induction of general endotracheal anesthesia, she was turned prone. The back was prepped with alcohol and chloro prep and draped in a sterile fashion. A midline incision was created at her previous surgical site on the superior portion of the incision in the dissection was carried down to the lumbar dorsal fascia. The fascia was opened on either side of midline and the old hardware at its superior insertion and L2 was uncovered. Portion of the rods between L2 and inferior aspects was uncovered from both sides. Aside connector was then placed onto the rods  in these positions. Attention was then turned to L1-L2 the soft tissues were cleared off the laminar arches the pars and the transverse process of L1 and the T 12 inferior laminar and facet connection. Once this was isolated then laminotomies were created removing the inferior margin lamina of L1-L2 including the medial wall the facet. During this process spinous process of L1 was weekend cracked and it was removed in nearly its entirety leaving the superior arch of the lamina intact to articulate with T12. Dissection was then undertaken to uncover the common dural tube and decompress the L1 nerve root superiorly for out into the lateral recess in the foramen. The disc space was isolated and there is noted to be a large herniation of the disc below the body of L2. This was incised and a discectomy was performed first from the right side and then from the left side. This gave good decompression of the central canal and lateral recesses. The disc space was then cleaned of all its contents and the endplates were then curettaged to remove any endplate cartilage. Once the surfaces were prepared and interbody spacer was placed in his felt that an 8 mm tall 23 mm long 4 lordotic spacer would fit best into this interval and reduce the spine to neutral construct. With this the interbody spacers were placed into the interspace along with 6 mL of autograft which was harvested from the laminar arch and the spinous processes that were removed. Along with this 2 strips of infuse were placed into the interspace on either side and strips of infuse were also placed into the cages themselves. Lateral gutters were then decorticated and the remainder of the bone was placed into the lateral gutters along with the remaining strips of infuse. Pedicle entry  sites were then chosen at L1 under fluoroscopic guidance pedicle screws measuring 6.5 x 45 mm screws were placed into L1. A Z rod was then used to connect the pedicle screws of L1 to  the previously placed side connectors. These were torqued down in a neutral construct. Care was taken to inspect the common dural tube the L1 nerve roots in the interspace to make sure all the graft was securely placed in the nerve roots and common dural tube were well decompressed. Hemostasis was checked meticulously and then the lumbar dorsal fascia was closed with #1 Vicryls in interrupted fashion, 20 Vicryls 17th tissues, 30 Vicryls subcuticularly. Dermabond was placed on the skin. Blood loss is estimated closed foreign cc, 100 mL of Cell Saver blood was returned to the patient.

## 2015-03-18 NOTE — Transfer of Care (Signed)
Immediate Anesthesia Transfer of Care Note  Patient: Katie Booker  Procedure(s) Performed: Procedure(s): Lumbar one-two Posterior lumbar interbody fusion (N/A)  Patient Location: PACU  Anesthesia Type:General  Level of Consciousness: oriented and patient cooperative  Airway & Oxygen Therapy: Patient Spontanous Breathing and Patient connected to nasal cannula oxygen  Post-op Assessment: Report given to RN, Post -op Vital signs reviewed and stable and Patient moving all extremities  Post vital signs: Reviewed and stable  Last Vitals:  Filed Vitals:   03/18/15 0628  BP: 125/66  Pulse: 86  Temp: 37.2 C  Resp: 20    Complications: No apparent anesthesia complications

## 2015-03-18 NOTE — Progress Notes (Signed)
Vital signs are stable Dressing is clean dry Motor function appears intact Stable postop

## 2015-03-18 NOTE — Anesthesia Postprocedure Evaluation (Signed)
Anesthesia Post Note  Patient: Katie Booker  Procedure(s) Performed: Procedure(s) (LRB): Lumbar one-two Posterior lumbar interbody fusion (N/A)  Patient location during evaluation: PACU Anesthesia Type: General Level of consciousness: awake and alert, oriented and patient cooperative Pain management: pain level controlled Vital Signs Assessment: post-procedure vital signs reviewed and stable Respiratory status: spontaneous breathing, nonlabored ventilation, respiratory function stable and patient connected to nasal cannula oxygen Cardiovascular status: blood pressure returned to baseline and stable Postop Assessment: no signs of nausea or vomiting Anesthetic complications: no    Last Vitals:  Filed Vitals:   03/18/15 1355 03/18/15 1412  BP: 135/83   Pulse: 96 100  Temp:    Resp: 13 22    Last Pain:  Filed Vitals:   03/18/15 1412  PainSc: 10-Worst pain ever                 Carlyn Lemke,E. Quynh Basso

## 2015-03-19 LAB — GLUCOSE, CAPILLARY
GLUCOSE-CAPILLARY: 91 mg/dL (ref 65–99)
GLUCOSE-CAPILLARY: 101 mg/dL — AB (ref 65–99)
Glucose-Capillary: 186 mg/dL — ABNORMAL HIGH (ref 65–99)
Glucose-Capillary: 142 mg/dL — ABNORMAL HIGH (ref 65–99)

## 2015-03-19 MED ORDER — INSULIN ASPART 100 UNIT/ML ~~LOC~~ SOLN
0.0000 [IU] | Freq: Three times a day (TID) | SUBCUTANEOUS | Status: DC
  Administered 2015-03-19: 3 [IU] via SUBCUTANEOUS

## 2015-03-19 MED FILL — Sodium Chloride IV Soln 0.9%: INTRAVENOUS | Qty: 2000 | Status: AC

## 2015-03-19 MED FILL — Heparin Sodium (Porcine) Inj 1000 Unit/ML: INTRAMUSCULAR | Qty: 30 | Status: AC

## 2015-03-19 NOTE — Evaluation (Signed)
Occupational Therapy Evaluation Patient Details Name: Katie Booker MRN: [id number] DOB: August 20, 1945 Today's Date: 03/19/2015    History of Present Illness Pt is a 70 y.o. female s/p Lumbar one-two Posterior lumbar interbody fusion. PMH: Anxiety, depression, HTN, Peripheral neuropathy, DM2, sleep apnea.     Clinical Impression   OT eval limited by anxiety and pain; pt reports feeling like she is having a panic attack upon OTs arrival to room and is declining OOB activities at this time. Pt reports she was independent with ADLs and used a rollator for all mobility PTA. Educated pt on back precautions and bed mobility techniques for repositioning. Pt lives alone and is planning d/c to SNF for short term post acute rehab; agree with SNF placement. Pt would benefit from continued skilled OT to maximize independence and safety with LB ADLs, toilet transfers, donning/doffing back brace, and maintaining precautions throughout functional activities.    Follow Up Recommendations  SNF;Supervision/Assistance - 24 hour    Equipment Recommendations  Other (comment) (TBD)    Recommendations for Other Services PT consult     Precautions / Restrictions Precautions Precautions: Back;Fall Precaution Booklet Issued: No Precaution Comments: Educated pt on back precautions. Required Braces or Orthoses: Spinal Brace Spinal Brace: Lumbar corset Restrictions Weight Bearing Restrictions: No      Mobility Bed Mobility Overal bed mobility: Needs Assistance Bed Mobility: Rolling Rolling: Supervision         General bed mobility comments: VCs for technique and hand placement. Use of rails.   Transfers                 General transfer comment: Not assessed at this time.    Balance                                            ADL Overall ADL's : Needs assistance/impaired                                       General ADL Comments: OT eval limited by  pain and anxiety. Pt reports that she just got back to bed and is not interested in OOB activities at this time. Educated on back precautions, bed mobility techniques to maintain back precautions, safety, and relaxation techniques. Upon entry, pt reporting she is "having a panic attack"; pt noted to be very anxious and tearful throughout session (RN and MD aware).     Vision     Perception     Praxis      Pertinent Vitals/Pain Pain Assessment: 0-10 Pain Score: 5  Pain Location: back Pain Descriptors / Indicators: Aching;Headache;Sore Pain Intervention(s): Limited activity within patient's tolerance;Monitored during session;Utilized relaxation techniques     Hand Dominance Right   Extremity/Trunk Assessment Upper Extremity Assessment Upper Extremity Assessment: Generalized weakness   Lower Extremity Assessment Lower Extremity Assessment: Defer to PT evaluation   Cervical / Trunk Assessment Cervical / Trunk Assessment: Other exceptions Cervical / Trunk Exceptions: s/p spinal surgery   Communication Communication Communication: No difficulties   Cognition Arousal/Alertness: Awake/alert Behavior During Therapy: Anxious Overall Cognitive Status: Within Functional Limits for tasks assessed                     General Comments       Exercises  Shoulder Instructions      Home Living Family/patient expects to be discharged to:: Skilled nursing facility Living Arrangements: Alone   Type of Home: Apartment Home Access: Level entry     Home Layout: One level     Bathroom Shower/Tub: Producer, television/film/video: Standard Bathroom Accessibility: Yes How Accessible: Accessible via walker Home Equipment: Walker - 4 wheels;Shower seat - built Designer, fashion/clothing: Reacher        Prior Functioning/Environment Level of Independence: Independent with assistive device(s)        Comments: Pt reports she used rollator for  mobility in home and community. Independent with ADLs/IADLs    OT Diagnosis: Generalized weakness;Acute pain   OT Problem List: Impaired balance (sitting and/or standing);Decreased safety awareness;Decreased knowledge of use of DME or AE;Decreased knowledge of precautions;Obesity;Pain   OT Treatment/Interventions: Self-care/ADL training;Energy conservation;DME and/or AE instruction;Therapeutic activities;Patient/family education    OT Goals(Current goals can be found in the care plan section) Acute Rehab OT Goals Patient Stated Goal: rehab then home OT Goal Formulation: With patient Time For Goal Achievement: 04/02/15 Potential to Achieve Goals: Good ADL Goals Pt Will Perform Grooming: with supervision;standing Pt Will Perform Lower Body Bathing: with supervision;sit to/from stand (with or without AE) Pt Will Perform Lower Body Dressing: with supervision;sit to/from stand (with or without AE) Pt Will Transfer to Toilet: with supervision;ambulating;bedside commode (over toilet) Pt Will Perform Toileting - Clothing Manipulation and hygiene: with supervision;with adaptive equipment;sit to/from stand Additional ADL Goal #1: Pt will independently don/doff back brace. Additional ADL Goal #2: Pt will independently maintain back precautions throughout ADL activity.  OT Frequency: Min 3X/week   Barriers to D/C: Decreased caregiver support  Pt lives alone       Co-evaluation              End of Session Nurse Communication: Other (comment) (Pt reports having panic attack, high anxiety)  Activity Tolerance: Patient limited by pain;Other (comment) (Limited by anxiety) Patient left: in bed;with call bell/phone within reach;with bed alarm set   Time: [phone] OT Time Calculation (min): 22 min Charges:  OT General Charges $OT Visit: 1 Procedure OT Evaluation $OT Eval Moderate Complexity: 1 Procedure G-Codes:     Gaye Alken M.S., OTR/L Pager: [phone]  03/19/2015, 9:12  AM

## 2015-03-19 NOTE — Progress Notes (Signed)
Patient ID: Katie Booker, female   DOB: 01/04/1946, 70 y.o.   MRN: [id number] Vital signs are stable since surgery Motor function appears good in lower extremities Patient still has Foley in this morning Expressed importance of removing the Foley and having patient started to ambulate and void Philis Kendall notes that she is planning to go to [facility] that I agree that it would be beneficial for her to have some further recuperation before she ventures home on her own Social services will engage in that process I would anticipate discharge for Friday

## 2015-03-19 NOTE — Evaluation (Signed)
Physical Therapy Evaluation Patient Details Name: Katie Booker MRN: [id number] DOB: 12/17/1945 Today's Date: 03/19/2015   History of Present Illness  Pt is a 70 y.o. female s/p Lumbar one-two Posterior lumbar interbody fusion. PMH: Anxiety, depression, HTN, Peripheral neuropathy, DM2, sleep apnea.    Clinical Impression  Pt admitted with above diagnosis. Pt currently with functional limitations due to the deficits listed below (see PT Problem List). At the time of PT eval pt was able to perform transfers and ambulation with min guard assist. Pt required increased time during session due to anxiety and feeling of a panic attack coming on. Pt reports plan to d/c to SNF prior to return home as she lives alone. Feel this is appropriate. Pt will benefit from skilled PT to increase their independence and safety with mobility to allow discharge to the venue listed below.       Follow Up Recommendations SNF;Supervision/Assistance - 24 hour    Equipment Recommendations  None recommended by PT    Recommendations for Other Services       Precautions / Restrictions Precautions Precautions: Back;Fall Precaution Booklet Issued: No Precaution Comments: Pt was educated on back precautions during functional mobility.  Required Braces or Orthoses: Spinal Brace Spinal Brace: Lumbar corset Restrictions Weight Bearing Restrictions: No      Mobility  Bed Mobility   General bed mobility comments: Pt sitting up in recliner upon PT arrival  Transfers Overall transfer level: Needs assistance Equipment used: 4-wheeled walker Transfers: Sit to/from Stand Sit to Stand: Min guard         General transfer comment: Hands-on guarding for safety.   Ambulation/Gait Ambulation/Gait assistance: Min guard Ambulation Distance (Feet): 125 Feet Assistive device: Rolling walker (2 wheeled) Gait Pattern/deviations: Step-through pattern;Decreased stride length Gait velocity: Decreased Gait velocity  interpretation: Below normal speed for age/gender General Gait Details: Slow and guarded gait. No obvious unsteadiness or LOB noted.   Stairs            Wheelchair Mobility    Modified Rankin (Stroke Patients Only)       Balance Overall balance assessment: Needs assistance Sitting-balance support: Feet supported;No upper extremity supported Sitting balance-Leahy Scale: Fair     Standing balance support: No upper extremity supported;During functional activity Standing balance-Leahy Scale: Fair Standing balance comment: Statically. For dynamic movement pt requires UE support.                              Pertinent Vitals/Pain Pain Assessment: Faces Pain Score: 5  Faces Pain Scale: Hurts little more Pain Location: Incision Pain Descriptors / Indicators: Operative site guarding;Discomfort Pain Intervention(s): Limited activity within patient's tolerance;Monitored during session;Repositioned    Home Living Family/patient expects to be discharged to:: Skilled nursing facility Living Arrangements: Alone   Type of Home: Apartment Home Access: Level entry     Home Layout: One level Home Equipment: Walker - 4 wheels;Shower seat - built Scientist, clinical (histocompatibility and immunogenetics)      Prior Function Level of Independence: Independent with assistive device(s)         Comments: Pt reports she used rollator for mobility in home and community. Independent with ADLs/IADLs     Hand Dominance   Dominant Hand: Right    Extremity/Trunk Assessment   Upper Extremity Assessment: Defer to OT evaluation           Lower Extremity Assessment: Generalized weakness      Cervical / Trunk Assessment: Other exceptions  Communication   Communication: No difficulties  Cognition Arousal/Alertness: Awake/alert Behavior During Therapy: Anxious Overall Cognitive Status: Within Functional Limits for tasks assessed                      General Comments      Exercises         Assessment/Plan    PT Assessment Patient needs continued PT services  PT Diagnosis Difficulty walking;Acute pain   PT Problem List Decreased strength;Decreased range of motion;Decreased activity tolerance;Decreased balance;Decreased mobility;Decreased knowledge of use of DME;Decreased safety awareness;Decreased knowledge of precautions;Pain  PT Treatment Interventions DME instruction;Gait training;Stair training;Functional mobility training;Therapeutic activities;Therapeutic exercise;Neuromuscular re-education;Patient/family education   PT Goals (Current goals can be found in the Care Plan section) Acute Rehab PT Goals Patient Stated Goal: rehab then home PT Goal Formulation: With patient Time For Goal Achievement: 03/26/15 Potential to Achieve Goals: Good    Frequency Min 5X/week   Barriers to discharge        Co-evaluation               End of Session Equipment Utilized During Treatment: Back brace Activity Tolerance: Other (comment) (Limited by anxiety and "panic attacks") Patient left: in chair;with call bell/phone within reach Nurse Communication: Mobility status         Time: 9604-5409 PT Time Calculation (min) (ACUTE ONLY): 24 min   Charges:   PT Evaluation $PT Eval Moderate Complexity: 1 Procedure PT Treatments $Gait Training: 8-22 mins   PT G Codes:        Conni Slipper 2015-04-02, 12:30 PM   Conni Slipper, PT, DPT Acute Rehabilitation Services Pager: [phone]

## 2015-03-19 NOTE — Progress Notes (Signed)
Patient asked not to remove foley but will attempt to make it to the chair this morning. Will continue to monitor. Suzy Bouchard E, [place] 03/19/2015 4:27 AM

## 2015-03-20 DIAGNOSIS — E785 Hyperlipidemia, unspecified: Secondary | ICD-10-CM | POA: Diagnosis not present

## 2015-03-20 DIAGNOSIS — M4316 Spondylolisthesis, lumbar region: Secondary | ICD-10-CM | POA: Diagnosis not present

## 2015-03-20 DIAGNOSIS — M545 Low back pain: Secondary | ICD-10-CM | POA: Diagnosis not present

## 2015-03-20 DIAGNOSIS — E039 Hypothyroidism, unspecified: Secondary | ICD-10-CM | POA: Diagnosis not present

## 2015-03-20 DIAGNOSIS — R2681 Unsteadiness on feet: Secondary | ICD-10-CM | POA: Diagnosis not present

## 2015-03-20 DIAGNOSIS — F329 Major depressive disorder, single episode, unspecified: Secondary | ICD-10-CM | POA: Diagnosis not present

## 2015-03-20 DIAGNOSIS — D62 Acute posthemorrhagic anemia: Secondary | ICD-10-CM | POA: Diagnosis not present

## 2015-03-20 DIAGNOSIS — K5900 Constipation, unspecified: Secondary | ICD-10-CM | POA: Diagnosis not present

## 2015-03-20 DIAGNOSIS — I10 Essential (primary) hypertension: Secondary | ICD-10-CM | POA: Diagnosis not present

## 2015-03-20 DIAGNOSIS — Z981 Arthrodesis status: Secondary | ICD-10-CM | POA: Diagnosis not present

## 2015-03-20 DIAGNOSIS — M549 Dorsalgia, unspecified: Secondary | ICD-10-CM | POA: Diagnosis not present

## 2015-03-20 DIAGNOSIS — M4326 Fusion of spine, lumbar region: Secondary | ICD-10-CM | POA: Diagnosis not present

## 2015-03-20 DIAGNOSIS — Z79899 Other long term (current) drug therapy: Secondary | ICD-10-CM | POA: Diagnosis not present

## 2015-03-20 DIAGNOSIS — E119 Type 2 diabetes mellitus without complications: Secondary | ICD-10-CM | POA: Diagnosis not present

## 2015-03-20 DIAGNOSIS — G4730 Sleep apnea, unspecified: Secondary | ICD-10-CM | POA: Diagnosis not present

## 2015-03-20 LAB — GLUCOSE, CAPILLARY
GLUCOSE-CAPILLARY: 134 mg/dL — AB (ref 65–99)
Glucose-Capillary: 103 mg/dL — ABNORMAL HIGH (ref 65–99)

## 2015-03-20 MED ORDER — DIAZEPAM 5 MG PO TABS: 5.0000 mg | ORAL_TABLET | Freq: Four times a day (QID) | ORAL | Status: DC | PRN

## 2015-03-20 MED ORDER — OXYCODONE-ACETAMINOPHEN 5-325 MG PO TABS: 1.0000 | ORAL_TABLET | ORAL | Status: DC | PRN

## 2015-03-20 NOTE — Discharge Summary (Signed)
Physician Discharge Summary  Patient ID: Katie Booker MRN: [id number] DOB/AGE: 30-Mar-1945 70 y.o.  Admit date: 03/18/2015 Discharge date: 03/20/2015  Admission Diagnoses: Spondylolisthesis L1-L2 with myelopathy and radiculopathy, diabetes mellitus, orbit obesity  Discharge Diagnoses: Spondylolisthesis L1-L2 with myelopathy and radiculopathy, diabetes mellitus, morbid obesity Active Problems:   Spondylolisthesis at L1-L2 level   Discharged Condition: good  Hospital Course: Patient was admitted to undergo surgical decompression and stabilization at L1-L2. She tolerated surgery well. She lives independently at home but will require help of a skilled nursing facility in order to gain her independence further before draping discharged her home  Consults: None  Significant Diagnostic Studies: None  Treatments: surgery: Decompression of L1-L2 with posterior lumbar interbody arthrodesis pedicle screw fixation L1 to previous L2-L5 construct  Discharge Exam: Blood pressure 127/58, pulse 104, temperature 98.2 F (36.8 C), temperature source Oral, resp. rate 20, height  (1.575 m), weight 87.544 kg (193 lb), SpO2 94 %. Incision is clean and dry, motor function is intact in lower extremities. Station and gait are intact.  Disposition: Discharge to skilled nursing facility when bed available  Discharge Instructions    Call MD for:  redness, tenderness, or signs of infection (pain, swelling, redness, odor or green/yellow discharge around incision site)    Complete by:  As directed      Call MD for:  severe uncontrolled pain    Complete by:  As directed      Call MD for:  temperature >100.4    Complete by:  As directed      Diet - low sodium heart healthy    Complete by:  As directed      Discharge instructions    Complete by:  As directed   Okay to shower. Do not apply salves or appointments to incision. No heavy lifting with the upper extremities greater than 15 pounds. May resume  driving when not requiring pain medication and patient feels comfortable with doing so.     Increase activity slowly    Complete by:  As directed             Medication List    TAKE these medications        ALPRAZolam 0.5 MG tablet  Commonly known as:  XANAX  Take 1 tablet (0.5 mg total) by mouth 2 (two) times daily.     atenolol 25 MG tablet  Commonly known as:  TENORMIN  Take 1 tablet by mouth  daily     atorvastatin 40 MG tablet  Commonly known as:  LIPITOR  Take 1 tablet by mouth once daily     beta carotene w/minerals tablet  Take 1 tablet by mouth daily.     citalopram 40 MG tablet  Commonly known as:  CELEXA  Take 1 tablet by mouth  daily     diazepam 5 MG tablet  Commonly known as:  VALIUM  Take 1-2 tablets (5-10 mg total) by mouth every 6 (six) hours as needed for muscle spasms.     estradiol 0.1 MG/GM vaginal cream  Commonly known as:  ESTRACE  Place 1 Applicatorful vaginally 3 (three) times a week.     Fish Oil 1200 MG Caps  Take 2 capsules by mouth 2 (two) times daily.     gemfibrozil 600 MG tablet  Commonly known as:  LOPID  Take one tablet by mouth  twice daily before meals     glimepiride 2 MG tablet  Commonly known as:  AMARYL  Take one-half tablet by  mouth daily     glucose blood test strip  Commonly known as:  ONE TOUCH ULTRA TEST  1 each by Other route daily. Use as instructed     GNP CALCIUM 600 PLUS D/MINERAL 600-400 MG-UNIT Tabs  Take 1 tablet by mouth 2 (two) times daily.     HYDROcodone-acetaminophen 5-325 MG tablet  Commonly known as:  NORCO/VICODIN  1 by mouth every 6 hours as needed for pain **DO NOT EXCEED 4 GM OF TYLENOL IN 24 HOURS**     hydrocortisone 2.5 % cream  Apply topically 2 (two) times daily.     levothyroxine 150 MCG tablet  Commonly known as:  SYNTHROID, LEVOTHROID  Take 1 tablet by mouth once daily     lisinopril 10 MG tablet  Commonly known as:  PRINIVIL,ZESTRIL  Take 1 tablet by mouth once daily      loratadine 10 MG tablet  Commonly known as:  CLARITIN  Take 10 mg by mouth daily as needed for allergies.     LUBRICANT EYE DROPS OP  Apply 2 drops to eye 4 (four) times daily as needed.     meloxicam 15 MG tablet  Commonly known as:  MOBIC  Take 1 tablet by mouth  daily     metFORMIN 1000 MG tablet  Commonly known as:  GLUCOPHAGE  take 1 tablet by mouth  twice daily with meal     methocarbamol 500 MG tablet  Commonly known as:  ROBAXIN  Take 500 mg by mouth every 6 (six) hours as needed for muscle spasms.     multivitamin tablet  Take 1 tablet by mouth daily.     omeprazole 20 MG capsule  Commonly known as:  PRILOSEC  Take 1 capsule by mouth two times daily     onetouch ultrasoft lancets  Use daily     oxyCODONE-acetaminophen 5-325 MG tablet  Commonly known as:  PERCOCET/ROXICET  Take 1-2 tablets by mouth every 4 (four) hours as needed for moderate pain.     pregabalin 75 MG capsule  Commonly known as:  LYRICA  TAKE ONE CAPSULE BY MOUTH ONE TIME DAILY     tobramycin 0.3 % ophthalmic solution  Commonly known as:  TOBREX  Place 1 drop into the right eye See admin instructions. Only uses when getting eye injection. Only use the day before, the day of, and the day after. For [facility] Treatment.     traZODone 100 MG tablet  Commonly known as:  DESYREL  Take 2 tablets by mouth at  bedtime         Signed: Stefani Dama 03/20/2015, 9:29 AM

## 2015-03-20 NOTE — Progress Notes (Addendum)
Discharge orders received, Pt for discharge today to [facility] report given to [facility], Charity fundraiser. IV d/c'd. D/c instructions and RX given with verbalized understanding. Transferred by [facility]. 03/20/15 1407

## 2015-03-20 NOTE — [place] FL2 (Signed)
[place] MEDICAID FL2 LEVEL OF CARE SCREENING TOOL     IDENTIFICATION  Patient Name: Katie Booker Birthdate: June 22, 1945 Sex: female Admission Date (Current Location): 03/18/2015  [employer] and [place] Number:  Producer, television/film/video and Address:  [facility], [address], [place], [place] [address]      Provider Number: [id number]  Attending Physician Name and Address:  Barnett Abu, MD  Relative Name and Phone Number:  Joni Reining daughter, [phone]    Current Level of Care: Hospital Recommended Level of Care: Skilled Nursing Facility Prior Approval Number:    Date Approved/Denied:   PASRR Number: [id number]  Discharge Plan: SNF    Current Diagnoses: Patient Active Problem List   Diagnosis Date Noted  . Spondylolisthesis at L1-L2 level 03/18/2015  . Spondylosis with myelopathy 09/02/2014  . Cervical spondylosis with myelopathy 09/02/2014  . Anxiety 12/21/2012  . Chronic back pain 12/21/2012  . Insomnia 12/21/2012  . 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 Spinal stenosis, lumbar region, with neurogenic claudication 10/26/2012  . Spinal stenosis, thoracic 10/26/2012  . Umbilical hernia 02/25/2012  . OSA (obstructive sleep apnea) 11/12/2011  . Exogenous obesity 11/12/2011  . 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 Hypothyroidism 09/18/2009  . Diabetes mellitus without complication (HCC) 09/18/2009  . Dyslipidemia 09/18/2009  . ANEMIA-NOS 09/18/2009  . DEPRESSION 09/18/2009  . Essential hypertension 09/18/2009  . GERD 09/18/2009  . OSTEOARTHRITIS 09/18/2009    Orientation RESPIRATION BLADDER Height & Weight    Self, Time, Situation, Place  Normal Continent, Indwelling catheter (urinary catheter)  (157.5 cm) 193 lbs.  BEHAVIORAL SYMPTOMS/MOOD NEUROLOGICAL BOWEL NUTRITION STATUS   (N/A)   Continent  (Please see DC summary)  AMBULATORY STATUS COMMUNICATION OF NEEDS Skin   Limited Assist Verbally Surgical  wounds (Closed back incision; rectum incision;)                       Personal Care Assistance Level of Assistance  Bathing, Feeding, Dressing Bathing Assistance: Limited assistance Feeding assistance: Independent Dressing Assistance: Limited assistance     Functional Limitations Info             SPECIAL CARE FACTORS FREQUENCY  PT (By licensed PT), OT (By licensed OT)     PT Frequency: 5x/week OT Frequency: 3x/week            Contractures      Additional Factors Info  Code Status, Allergies, Psychotropic, Insulin Sliding Scale Code Status Info: Full Allergies Info: Duraprep, Hydromorphone Hcl, Morphine And Related Psychotropic Info: Xanax Insulin Sliding Scale Info: insulin aspart (novoLOG) injection 0-15 Units; Metformin       Current Medications (03/20/2015):  This is the current hospital active medication list Current Facility-Administered Medications  Medication Dose Route Frequency Provider Last Rate Last Dose  . 0.9 %  sodium chloride infusion  250 mL Intravenous Continuous Barnett Abu, MD   250 mL at 03/18/15 1828  . acetaminophen (TYLENOL) tablet 650 mg  650 mg Oral Q4H PRN Barnett Abu, MD       Or  . acetaminophen (TYLENOL) suppository 650 mg  650 mg Rectal Q4H PRN Barnett Abu, MD      . ALPRAZolam Prudy Feeler) tablet 0.5 mg  0.5 mg Oral BID Barnett Abu, MD   0.5 mg at 03/19/15 2113  . alum & mag hydroxide-simeth (MAALOX/MYLANTA) 200-200-20 MG/5ML suspension 30 mL  30 mL Oral Q6H PRN Barnett Abu, MD      . atenolol (  TENORMIN) tablet 25 mg  25 mg Oral Daily Barnett Abu, MD   25 mg at 03/19/15 0807  . atorvastatin (LIPITOR) tablet 40 mg  40 mg Oral q1800 Barnett Abu, MD   40 mg at 03/19/15 1750  . bisacodyl (DULCOLAX) suppository 10 mg  10 mg Rectal Daily PRN Barnett Abu, MD      . citalopram (CELEXA) tablet 40 mg  40 mg Oral Daily Barnett Abu, MD   40 mg at 03/19/15 0806  . diazepam (VALIUM) tablet 5-10 mg  5-10 mg Oral Q6H PRN Coletta Memos, MD   10  mg at 03/19/15 0808  . docusate sodium (COLACE) capsule 100 mg  100 mg Oral BID Barnett Abu, MD   100 mg at 03/19/15 2114  . estradiol (ESTRACE) vaginal cream 1 Applicatorful  1 Applicatorful Vaginal Once per day on Mon Wed Fri Barnett Abu, MD   1 Applicatorful at 03/19/15 2200  . fentaNYL (SUBLIMAZE) injection 25-50 mcg  25-50 mcg Intravenous Q4H PRN Barnett Abu, MD   25 mcg at 03/19/15 0446  . gemfibrozil (LOPID) tablet 600 mg  600 mg Oral BID AC Barnett Abu, MD   600 mg at 03/19/15 1700  . glimepiride (AMARYL) tablet 2 mg  2 mg Oral Q breakfast Barnett Abu, MD   2 mg at 03/19/15 0807  . HYDROcodone-acetaminophen (NORCO/VICODIN) 5-325 MG per tablet 1-2 tablet  1-2 tablet Oral Q4H PRN Barnett Abu, MD      . hydrocortisone 2.5 % cream 1 application  1 application Topical BID PRN Barnett Abu, MD      . insulin aspart (novoLOG) injection 0-15 Units  0-15 Units Subcutaneous TID WC Julio Sicks, MD   3 Units at 03/19/15 0702  . levothyroxine (SYNTHROID, LEVOTHROID) tablet 150 mcg  150 mcg Oral QAC breakfast Barnett Abu, MD   150 mcg at 03/19/15 0807  . lisinopril (PRINIVIL,ZESTRIL) tablet 10 mg  10 mg Oral Daily Barnett Abu, MD   10 mg at 03/19/15 0807  . loratadine (CLARITIN) tablet 10 mg  10 mg Oral Daily PRN Barnett Abu, MD      . magnesium citrate solution 1 Bottle  1 Bottle Oral Once PRN Barnett Abu, MD      . menthol-cetylpyridinium (CEPACOL) lozenge 3 mg  1 lozenge Oral PRN Barnett Abu, MD       Or  . phenol (CHLORASEPTIC) mouth spray 1 spray  1 spray Mouth/Throat PRN Barnett Abu, MD      . metFORMIN (GLUCOPHAGE) tablet 1,000 mg  1,000 mg Oral BID WC Barnett Abu, MD   1,000 mg at 03/19/15 1750  . methocarbamol (ROBAXIN) tablet 500 mg  500 mg Oral Q6H PRN Barnett Abu, MD   500 mg at 03/19/15 0047   Or  . methocarbamol (ROBAXIN) 500 mg in dextrose 5 % 50 mL IVPB  500 mg Intravenous Q6H PRN Barnett Abu, MD      . methocarbamol (ROBAXIN) tablet 500 mg  500 mg Oral Q6H PRN Barnett Abu, MD   500 mg at 03/20/15 0417  . ondansetron (ZOFRAN) injection 4 mg  4 mg Intravenous Q4H PRN Barnett Abu, MD      . oxyCODONE-acetaminophen (PERCOCET/ROXICET) 5-325 MG per tablet 1-2 tablet  1-2 tablet Oral Q4H PRN Barnett Abu, MD   2 tablet at 03/20/15 0417  . pantoprazole (PROTONIX) EC tablet 40 mg  40 mg Oral Daily Barnett Abu, MD   40 mg at 03/19/15 0806  . polyethylene glycol (MIRALAX / GLYCOLAX) packet  17 g  17 g Oral Daily PRN Barnett Abu, MD      . polyvinyl alcohol (LIQUIFILM TEARS) 1.4 % ophthalmic solution 1 drop  1 drop Both Eyes QID PRN Barnett Abu, MD      . pregabalin (LYRICA) capsule 75 mg  75 mg Oral BID Barnett Abu, MD   75 mg at 03/19/15 2113  . senna (SENOKOT) tablet 8.6 mg  1 tablet Oral BID Barnett Abu, MD   8.6 mg at 03/19/15 2114  . sodium chloride 0.9 % injection 3 mL  3 mL Intravenous Q12H Barnett Abu, MD   3 mL at 03/19/15 2117  . sodium chloride 0.9 % injection 3 mL  3 mL Intravenous PRN Barnett Abu, MD      . tobramycin (TOBREX) 0.3 % ophthalmic solution 1 drop  1 drop Right Eye PRN Barnett Abu, MD      . traZODone (DESYREL) tablet 100 mg  100 mg Oral QHS PRN Barnett Abu, MD         Discharge Medications: Please see discharge summary for a list of discharge medications.  Relevant Imaging Results:  Relevant Lab Results:   Additional Information SSN: [id number] [place], [place]

## 2015-03-20 NOTE — Progress Notes (Signed)
Patient ID: Katie Booker, female   DOB: 24-Jun-1945, 70 y.o.   MRN: [id number] Vital signs are stable Patient complains of anxiety persisting Receiving her routine meds Incision on back remains clean and dry Motor function is doing well Ready for transfer to SNIF that available

## 2015-03-20 NOTE — Progress Notes (Signed)
Physical Therapy Treatment Patient Details Name: Katie Booker MRN: [id number] DOB: 07-14-1945 Today's Date: 03/20/2015    History of Present Illness Pt is a 70 y.o. female s/p Lumbar one-two Posterior lumbar interbody fusion. PMH: Anxiety, depression, HTN, Peripheral neuropathy, DM2, sleep apnea.      PT Comments    Pt progressing towards physical therapy goals. Increased need for education throughout session as pt does not like brace and did not want to wear it during mobility. Pt was educated on the benefits of proper brace use and encouraged donning throughout standing activity. Pt anticipates d/c to SNF this afternoon. Will continue to follow.   Follow Up Recommendations  SNF;Supervision/Assistance - 24 hour     Equipment Recommendations  None recommended by PT    Recommendations for Other Services       Precautions / Restrictions Precautions Precautions: Back;Fall Precaution Booklet Issued: No Precaution Comments: Pt was educated on back precautions during functional mobility.  Required Braces or Orthoses: Spinal Brace Spinal Brace: Lumbar corset Restrictions Weight Bearing Restrictions: No    Mobility  Bed Mobility               General bed mobility comments: Pt sitting up in recliner upon PT arrival.   Transfers Overall transfer level: Needs assistance Equipment used: 4-wheeled walker Transfers: Sit to/from Stand Sit to Stand: Min guard         General transfer comment: VC's for hand placement on seated surface for safety. 1 posterior LOB after transfer to standing in which min-mod assist was required to recover.   Ambulation/Gait Ambulation/Gait assistance: Min guard Ambulation Distance (Feet): 120 Feet Assistive device: Rolling walker (2 wheeled) Gait Pattern/deviations: Step-through pattern;Decreased stride length;Trunk flexed Gait velocity: Decreased Gait velocity interpretation: Below normal speed for age/gender General Gait Details: Slow  and guarded gait. No obvious unsteadiness or LOB noted.    Stairs            Wheelchair Mobility    Modified Rankin (Stroke Patients Only)       Balance Overall balance assessment: Needs assistance Sitting-balance support: Feet supported;No upper extremity supported Sitting balance-Leahy Scale: Fair     Standing balance support: No upper extremity supported;During functional activity Standing balance-Leahy Scale: Poor Standing balance comment: Posterior LOB noted. Min-mod assist required to recover. `                    Cognition Arousal/Alertness: Awake/alert Behavior During Therapy: Anxious Overall Cognitive Status: Within Functional Limits for tasks assessed                      Exercises      General Comments        Pertinent Vitals/Pain Pain Assessment: Faces Faces Pain Scale: Hurts even more Pain Location: Incision Pain Descriptors / Indicators: Operative site guarding;Discomfort Pain Intervention(s): Limited activity within patient's tolerance;Monitored during session;Repositioned    Home Living                      Prior Function            PT Goals (current goals can now be found in the care plan section) Acute Rehab PT Goals Patient Stated Goal: rehab then home PT Goal Formulation: With patient Time For Goal Achievement: 03/26/15 Potential to Achieve Goals: Good Progress towards PT goals: Progressing toward goals    Frequency  Min 5X/week    PT Plan Current plan remains appropriate  Co-evaluation             End of Session Equipment Utilized During Treatment: Back brace Activity Tolerance: Other (comment) (Limited by anxiety ) Patient left: in chair;with call bell/phone within reach;with chair alarm set     Time: 4098-1191 PT Time Calculation (min) (ACUTE ONLY): 25 min  Charges:  $Gait Training: 23-37 mins                    G Codes:      Conni Slipper 04/02/2015, 2:17 PM   Conni Slipper,  PT, DPT Acute Rehabilitation Services Pager: [phone]

## 2015-03-21 ENCOUNTER — Non-Acute Institutional Stay (SKILLED_NURSING_FACILITY): Payer: 59 | Admitting: Adult Health

## 2015-03-21 ENCOUNTER — Encounter: Payer: Self-pay | Admitting: Adult Health

## 2015-03-21 DIAGNOSIS — E039 Hypothyroidism, unspecified: Secondary | ICD-10-CM

## 2015-03-21 DIAGNOSIS — E119 Type 2 diabetes mellitus without complications: Secondary | ICD-10-CM | POA: Diagnosis not present

## 2015-03-21 DIAGNOSIS — M549 Dorsalgia, unspecified: Secondary | ICD-10-CM

## 2015-03-21 DIAGNOSIS — I10 Essential (primary) hypertension: Secondary | ICD-10-CM | POA: Diagnosis not present

## 2015-03-21 DIAGNOSIS — G8929 Other chronic pain: Secondary | ICD-10-CM | POA: Diagnosis not present

## 2015-03-21 DIAGNOSIS — Z79890 Hormone replacement therapy (postmenopausal): Secondary | ICD-10-CM

## 2015-03-21 DIAGNOSIS — K219 Gastro-esophageal reflux disease without esophagitis: Secondary | ICD-10-CM | POA: Diagnosis not present

## 2015-03-21 DIAGNOSIS — E785 Hyperlipidemia, unspecified: Secondary | ICD-10-CM

## 2015-03-21 DIAGNOSIS — G4700 Insomnia, unspecified: Secondary | ICD-10-CM | POA: Diagnosis not present

## 2015-03-21 DIAGNOSIS — F32A Depression, unspecified: Secondary | ICD-10-CM

## 2015-03-21 DIAGNOSIS — F419 Anxiety disorder, unspecified: Secondary | ICD-10-CM | POA: Diagnosis not present

## 2015-03-21 DIAGNOSIS — F329 Major depressive disorder, single episode, unspecified: Secondary | ICD-10-CM | POA: Diagnosis not present

## 2015-03-21 DIAGNOSIS — M4316 Spondylolisthesis, lumbar region: Secondary | ICD-10-CM

## 2015-03-21 NOTE — Progress Notes (Signed)
Patient ID: Katie Booker, female   DOB: [birth year], 70 y.o.   MRN: [id number]    DATE:  03/21/15  MRN:  [id number]  BIRTHDAY: [birth year]  Facility:  Nursing Home Location:  [facility]  Nursing Home Room Number: 207-9  LEVEL OF CARE:  SNF [phone])  Contact Information    Name Relation Home Work Mobile   Jones,Nicole Daughter [phone]  [phone]   Vanita Panda [phone]       Code Status History    Date Active Date Inactive Code Status Order ID Comments User Context   03/18/2015  5:39 PM 03/20/2015  6:09 PM Full Code [id number]  Barnett Abu, MD Inpatient   09/02/2014 11:16 AM 09/03/2014  4:36 PM Full Code [id number]  Barnett Abu, MD Inpatient   12/15/2012  4:55 PM 12/20/2012  2:08 PM Full Code [id number]  Barnett Abu, MD Inpatient       Chief Complaint  Patient presents with  . Hospitalization Follow-up    Spondylolisthesis L1-L2 with myelopathy S/P decompressiuon and interbody arthrodesis, Hypothyroidism, DM,, HRT, Depresiion, Hyperlipidemia, Hypertension, Chronic back pain, Anxiety, Insomnia and GERD  . Visual Field Change    HISTORY OF PRESENT ILLNESS:  This is a 70 year old female who has been admitted to [facility] on 03/20/15 from [facility] with Spondylolisthesis L1-L2 with myelopathy and radiculopathy S/P decompression of L1-L2 with posterior lumbar interbody arthrodesis pedicle screw fixation L1 to previous L2-L5  construct on 03/18/15. She has been admitted for a short-term rehabilitation.   PAST MEDICAL HISTORY:  Past Medical History  Diagnosis Date  . HYPERLIPIDEMIA 09/18/2009    takes Lipitor daily  . HYPOTHYROIDISM 09/18/2009  . OSTEOARTHRITIS 09/18/2009  . Obesity   . Hemorrhoids   . Anal fissure   . Anxiety     takes Xanax prn sleep and anxiety  . DEPRESSION 09/18/2009    takes Celexa daily  . HYPERTENSION 09/18/2009    takes Atenolol daily and Lisinopril  . Histoplasmosis     hx of  .  Shortness of breath     with exertion  . OSA (obstructive sleep apnea)     has a CPAP and sleep study report in epic from 2009  . History of bronchitis     last time around 1990  . Headache(784.0)     sinus HA  . Peripheral neuropathy (HCC)     takes Lyrica daily  . Joint pain   . Joint swelling   . Chronic back pain     scoliosis/stenosis/spondylosis  . Skin spots, red     right above both elbows;pt states always there  . GERD 09/18/2009    takes Omeprazole daily  . Umbilical hernia   . ANEMIA-NOS 09/18/2009    takes Ferrous Sulfate daily  . History of colon polyps   . Urinary frequency   . Urinary urgency   . Insomnia     takes trazodone and ambien nightly  . Cancer (HCC)     basal cell left cheek  . DIABETES MELLITUS, TYPE II 09/18/2009    takes Amaryl daily and Metformin  . Spondylolisthesis at L1-L2 level   . Morbid obesity (HCC)      CURRENT MEDICATIONS: Reviewed  Patient's Medications  New Prescriptions   No medications on file  Previous Medications   ALPRAZOLAM (XANAX) 0.5 MG TABLET    Take 1 tablet (0.5 mg total) by mouth 2 (two) times daily.   ATENOLOL (TENORMIN) 25 MG TABLET  Take 1 tablet by mouth  daily   ATORVASTATIN (LIPITOR) 40 MG TABLET    Take 1 tablet by mouth once daily   CALCIUM CARB-CHOLECALCIFEROL (CALCIUM 600/VITAMIN D3 PO)    Take 1 tablet by mouth 2 (two) times daily.   CARBOXYMETHYLCELLULOSE SODIUM (LUBRICANT EYE DROPS OP)    Apply 2 drops to eye 4 (four) times daily as needed.   CITALOPRAM (CELEXA) 40 MG TABLET    Take 1 tablet by mouth  daily   DIAZEPAM (VALIUM) 5 MG TABLET    Take 5 mg by mouth every 6 (six) hours as needed for anxiety or muscle spasms.   ESTRADIOL (ESTRACE) 0.1 MG/GM VAGINAL CREAM    Place 1 Applicatorful vaginally 3 (three) times a week.   GEMFIBROZIL (LOPID) 600 MG TABLET    Take one tablet by mouth  twice daily before meals   GLIMEPIRIDE (AMARYL) 2 MG TABLET    Take one-half tablet by  mouth daily   GLUCOSE BLOOD (ONE  TOUCH ULTRA TEST) TEST STRIP    1 each by Other route daily. Use as instructed   HYDROCODONE-ACETAMINOPHEN (NORCO/VICODIN) 5-325 MG TABLET    1 by mouth every 6 hours as needed for pain **DO NOT EXCEED 4 GM OF TYLENOL IN 24 HOURS**   HYDROCORTISONE 2.5 % CREAM    Apply 1 application topically 2 (two) times daily.   LANCETS (ONETOUCH ULTRASOFT) LANCETS    Use daily   LEVOTHYROXINE (SYNTHROID, LEVOTHROID) 150 MCG TABLET    Take 1 tablet by mouth once daily   LISINOPRIL (PRINIVIL,ZESTRIL) 10 MG TABLET    Take 1 tablet by mouth once daily   LORATADINE (CLARITIN) 10 MG TABLET    Take 10 mg by mouth daily as needed for allergies.   MELOXICAM (MOBIC) 15 MG TABLET    Take 1 tablet by mouth  daily   METFORMIN (GLUCOPHAGE) 1000 MG TABLET    take 1 tablet by mouth  twice daily with meal   OMEGA-3 FATTY ACIDS (FISH OIL) 1200 MG CAPS    Take 2 capsules by mouth 2 (two) times daily.  Modified Medications   No medications on file  Discontinued Medications   No medications on file     Allergies  Allergen Reactions  . Duraprep [Tersaseptic] Itching and Rash    Irritation everywhere prep was used  . Hydromorphone Hcl     Makes crazy  . Morphine And Related Other (See Comments)    hallucinations     REVIEW OF SYSTEMS:  GENERAL: no change in appetite, no fatigue, no weight changes, no fever, chills or weakness EYES: Denies change in vision, dry eyes, eye pain, itching or discharge EARS: Denies change in hearing, ringing in ears, or earache NOSE: Denies nasal congestion or epistaxis MOUTH and THROAT: Denies oral discomfort, gingival pain or bleeding, pain from teeth or hoarseness   RESPIRATORY: no cough, SOB, DOE, wheezing, hemoptysis CARDIAC: no chest pain, edema or palpitations GI: no abdominal pain, diarrhea, constipation, heart burn, nausea or vomiting GU: Denies dysuria, frequency, hematuria, incontinence, or discharge PSYCHIATRIC: Denies feeling of depression or anxiety. No report of  hallucinations, insomnia, paranoia, or agitation   PHYSICAL EXAMINATION  GENERAL APPEARANCE: Well nourished. In no acute distress. Normal body habitus SKIN:  Midline loer back incision is dry, no erythema HEAD: Normal in size and contour. No evidence of trauma EYES: Lids open and close normally. No blepharitis, entropion or ectropion. PERRL. Conjunctivae are clear and sclerae are white. Lenses are without opacity  EARS: Pinnae are normal. Patient hears normal voice tunes of the examiner MOUTH and THROAT: Lips are without lesions. Oral mucosa is moist and without lesions. Tongue is normal in shape, size, and color and without lesions NECK: supple, trachea midline, no neck masses, no thyroid tenderness, no thyromegaly LYMPHATICS: no LAN in the neck, no supraclavicular LAN RESPIRATORY: breathing is even & unlabored, BS CTAB CARDIAC: RRR, no murmur,no extra heart sounds, no edema GI: abdomen soft, normal BS, no masses, no tenderness, no hepatomegaly, no splenomegaly EXTREMITIES:  Able to move X 4 extremities PSYCHIATRIC: Alert and oriented X 3. Affect and behavior are appropriate  LABS/RADIOLOGY: Labs reviewed: Basic Metabolic Panel:  Recent Labs  91/47/82 1441 01/13/15 0935 03/12/15 1359  NA 138 140 140  K 4.5 4.8 5.1  CL 103 101 103  CO2 GLUCOSE 88 128* 109*  BUN CREATININE 0.79 0.72 0.79  CALCIUM 9.5 9.6 9.7   Liver Function Tests:  Recent Labs  01/13/15 0935  AST 19  ALT 27  ALKPHOS 95  BILITOT 0.3  PROT 7.0  ALBUMIN 4.4   CBC:  Recent Labs  08/22/14 1441 01/13/15 0935 03/12/15 1359  WBC 7.7 6.3 6.6  NEUTROABS  --  3.0  --   HGB 11.9* 12.1 11.7*  HCT 37.1 37.5 36.7  MCV 76.7* 78.6 78.6  PLT 192 226.0 222   Lipid Panel:  Recent Labs  01/13/15 0935  HDL 54.80   CBG:  Recent Labs  03/19/15 2129 03/20/15 0729 03/20/15 1146  GLUCAP 101* 134* 103*     Dg Lumbar Spine 2-3 Views  03/18/2015  CLINICAL DATA:  L1-2 PLIF EXAM:  LUMBAR SPINE - 2-3 VIEW COMPARISON:  01/09/2015 FLUOROSCOPY TIME:  Radiation Exposure Index (as provided by the fluoroscopic device): Not available If the device does not provide the exposure index: Fluoroscopy Time:  12 seconds Number of Acquired Images:  2 FINDINGS: New pedicle screws are noted at L1 with interbody fusion at L1-2. No hardware failure is noted. Previous lumbar fusion from L2-L5 is again noted. IMPRESSION: L1-2 PLIF Electronically Signed   By: Alcide Clever M.D.   On: 03/18/2015 11:50   Dg C-arm 1-60 Min  03/18/2015  CLINICAL DATA:  Intraoperative fluoroscopic images from lumpectomy and anterior and posterior spinal fusion. EXAM: DG C-ARM 61-120 MIN COMPARISON:  11/09/2013 FINDINGS: Fluoroscopy time was reported as 12 seconds. Frontal and lateral fluoroscopic view of the lumbosacral spine demonstrate posterior fusion of lumbosacral spine at L1-L2. There is a pre-existing L2-L5 fusion, the inferior portion of which is excluded by collimation. The intrapedicular surgical screws are in satisfactory position. The alignment is near anatomic. Disc spacer are also in satisfactory position. There is no evidence of fracture. IMPRESSION: L1-L2 PLIF, without evidence of immediate complications. Electronically Signed   By: Ted Mcalpine M.D.   On: 03/18/2015 11:53    ASSESSMENT/PLAN:  Spondylolisthesis L1-L2 with myelopathy and radiculopathy S/P decompression of of L1-L2 with posterior lumbar interbody arthrodesis pedicle screw fixation L1 to previous L2-L5 construct - for rehabilitation; continue Valium 5 mg 1 tab PO Q 6 hours PRN and Robaxin 500 mg 1 tab PO Q 6 hours PRN for muscle spasm; discontinue Norco, start Percocet 5/325 mg  1 tab PO Q 6AM, 12PM and 6PM and continue Percocet 5/325 mg 1 tab PO Q 6 hours PRN; follow-up with Dr. Danielle Dess, neurosurgeon; check CBC  Hypothyroidism - tsh 3.39; continue Synthroid 150 mcg 1 tab PO daily  Diabetes  Mellitus, type 2 hgbA1c 6.5; continue Amaryl 2 mg  1/2 tab = 1 mg PO daily and Metformin 1,000 mg PO BID  HRT - continue Estrrace 0.1mg /gm 1 application 3X/week  Depression - mood is stable; continue Celexa 40 mg 1 tab PO daily  Hyperlipidemia - continue Atorvastatin 40 mg 1 tab po daily, Fish Oil 1,200 mg 2 caps PO daily and Lopid 600 mg PO BID  Hypertension -  Continue Atenolol 25 mg 1 tab PO daily, Lisinopril 10 mg 1 tab PO daily; check BMP  Chronic back pain -  Pain is well-controlled; continue Lyrica 75 mg 1 capsule PO daily  Anxiety - continue Xanax 0.5 mg 1 tab PO BID  GERD - continue Prilosec 20 mg 1 capsule PO daily  Insomnia - continue Trazodone 100 mg  2 tabs PO Q HS     Goals of care:  Short-term rehabilitation    [facility], NP [facility] [phone]

## 2015-03-24 ENCOUNTER — Encounter: Payer: Self-pay | Admitting: Internal Medicine

## 2015-03-24 ENCOUNTER — Non-Acute Institutional Stay (SKILLED_NURSING_FACILITY): Payer: 59 | Admitting: Internal Medicine

## 2015-03-24 DIAGNOSIS — K5900 Constipation, unspecified: Secondary | ICD-10-CM | POA: Diagnosis not present

## 2015-03-24 DIAGNOSIS — E039 Hypothyroidism, unspecified: Secondary | ICD-10-CM

## 2015-03-24 DIAGNOSIS — I10 Essential (primary) hypertension: Secondary | ICD-10-CM

## 2015-03-24 DIAGNOSIS — E119 Type 2 diabetes mellitus without complications: Secondary | ICD-10-CM | POA: Diagnosis not present

## 2015-03-24 DIAGNOSIS — M4316 Spondylolisthesis, lumbar region: Secondary | ICD-10-CM | POA: Diagnosis not present

## 2015-03-24 DIAGNOSIS — K219 Gastro-esophageal reflux disease without esophagitis: Secondary | ICD-10-CM

## 2015-03-24 DIAGNOSIS — D62 Acute posthemorrhagic anemia: Secondary | ICD-10-CM

## 2015-03-24 DIAGNOSIS — F418 Other specified anxiety disorders: Secondary | ICD-10-CM | POA: Diagnosis not present

## 2015-03-24 NOTE — Progress Notes (Signed)
Patient ID: Katie Booker, female   DOB: April 27, 1945, 70 y.o.   MRN: [id number]    [facility]   PCP: Rogelia Boga, MD  Code Status: Full Code  Allergies  Allergen Reactions  . Duraprep [Tersaseptic] Itching and Rash    Irritation everywhere prep was used  . Hydromorphone Hcl     Makes crazy  . Morphine And Related Other (See Comments)    hallucinations    Chief Complaint  Patient presents with  . New Admit To SNF    New Admission     HPI:  70 y.o. patient is here for short term rehabilitation post hospital admission from 03/18/15-03/20/15 with L1-L2 spondylolisthesis with myelopathy and radiculopathy. She undernwent decompression and stabilization at L1-L2. She is seen in her room today.her pain is under control with current pain regimen.   Review of Systems:  Constitutional: Negative for fever, chills, diaphoresis.  HENT: Negative for headache, congestion, nasal discharge Eyes: Negative for eye pain, blurred vision, double vision and discharge.  Respiratory: Negative for cough, shortness of breath and wheezing.   Cardiovascular: Negative for chest pain, palpitations, leg swelling.  Gastrointestinal: Negative for heartburn, nausea, vomiting, abdominal pain. Genitourinary: Negative for dysuria and flank pain.  Musculoskeletal: Negative for back pain, falls in the facility Skin: Negative for itching, rash.  Neurological: Negative for dizziness Psychiatric/Behavioral: Negative for depression   Past Medical History  Diagnosis Date  . HYPERLIPIDEMIA 09/18/2009    takes Lipitor daily  . HYPOTHYROIDISM 09/18/2009  . OSTEOARTHRITIS 09/18/2009  . Obesity   . Hemorrhoids   . Anal fissure   . Anxiety     takes Xanax prn sleep and anxiety  . DEPRESSION 09/18/2009    takes Celexa daily  . HYPERTENSION 09/18/2009    takes Atenolol daily and Lisinopril  . Histoplasmosis     hx of  . Shortness of breath     with exertion  . OSA (obstructive sleep  apnea)     has a CPAP and sleep study report in epic from 2009  . History of bronchitis     last time around 1990  . Headache(784.0)     sinus HA  . Peripheral neuropathy (HCC)     takes Lyrica daily  . Joint pain   . Joint swelling   . Chronic back pain     scoliosis/stenosis/spondylosis  . Skin spots, red     right above both elbows;pt states always there  . GERD 09/18/2009    takes Omeprazole daily  . Umbilical hernia   . ANEMIA-NOS 09/18/2009    takes Ferrous Sulfate daily  . History of colon polyps   . Urinary frequency   . Urinary urgency   . Insomnia     takes trazodone and ambien nightly  . Cancer (HCC)     basal cell left cheek  . DIABETES MELLITUS, TYPE II 09/18/2009    takes Amaryl daily and Metformin  . Spondylolisthesis at L1-L2 level   . Morbid obesity [facility])    Past Surgical History  Procedure Laterality Date  . Tubal fulgaration  1991  . Colonoscopy  2007  . Esophagogastroduodenoscopy  2007  . Hemorrhoid surgery  03/23/2011    Procedure: HEMORRHOIDECTOMY;  Surgeon: Clovis Pu. Cornett, MD;  Location: [facility];  Service: General;  Laterality: N/A;  lateral internal sphincterotomy and hemorrhoidectomy  . Sphincterotomy    . Thoracic discectomy N/A 12/15/2012    Procedure: Thoracic ten-eleven Thoracic laminectomy;  Surgeon: Sherilyn Cooter  Danielle Dess, MD;  Location: [facility];  Service: Neurosurgery;  Laterality: N/A;  Thoracic ten-eleven Thoracic laminectomy  . Back surgery    . Anterior cervical decomp/discectomy fusion N/A 09/02/2014    Procedure: Cervical five- six  Anterior cervical decompression fusion;  Surgeon: Barnett Abu, MD;  Location: [facility];  Service: Neurosurgery;  Laterality: N/A;  C5-6 Anterior cervical decompression/diskectomy/fusion  . Carpal tunnel release      RIGHT      2016    Social History:   reports that she quit smoking about 11 years ago. Her smoking use included Cigarettes. She has a 20 pack-year smoking history. She has  never used smokeless tobacco. She reports that she drinks alcohol. She reports that she does not use illicit drugs.  Family History  Problem Relation Age of Onset  . Diabetes Father   . COPD Father   . Cancer Maternal Grandfather     stomach    Medications:   Medication List       This list is accurate as of: 03/24/15 12:44 PM.  Always use your most recent med list.               ALPRAZolam 0.5 MG tablet  Commonly known as:  XANAX  Take 1 tablet (0.5 mg total) by mouth 2 (two) times daily.     atenolol 25 MG tablet  Commonly known as:  TENORMIN  Take 1 tablet by mouth  daily     atorvastatin 40 MG tablet  Commonly known as:  LIPITOR  Take 1 tablet by mouth once daily     CALCIUM 600/VITAMIN D3 PO  Take 1 tablet by mouth 2 (two) times daily.     citalopram 40 MG tablet  Commonly known as:  CELEXA  Take 1 tablet by mouth  daily     diazepam 5 MG tablet  Commonly known as:  VALIUM  Take 5 mg by mouth every 6 (six) hours as needed for anxiety or muscle spasms.     estradiol 0.1 MG/GM vaginal cream  Commonly known as:  ESTRACE  Place 1 Applicatorful vaginally 3 (three) times a week.     Fish Oil 1200 MG Caps  Take 2 capsules by mouth 2 (two) times daily.     gemfibrozil 600 MG tablet  Commonly known as:  LOPID  Take one tablet by mouth  twice daily before meals     glimepiride 2 MG tablet  Commonly known as:  AMARYL  Take one-half tablet by  mouth daily     glucose blood test strip  Commonly known as:  ONE TOUCH ULTRA TEST  1 each by Other route daily. Use as instructed     HYDROcodone-acetaminophen 5-325 MG tablet  Commonly known as:  NORCO/VICODIN  1 by mouth every 6 hours as needed for pain **DO NOT EXCEED 4 GM OF TYLENOL IN 24 HOURS**     hydrocortisone 2.5 % cream  Apply 1 application topically 2 (two) times daily.     levothyroxine 150 MCG tablet  Commonly known as:  SYNTHROID, LEVOTHROID  Take 1 tablet by mouth once daily     lisinopril 10 MG  tablet  Commonly known as:  PRINIVIL,ZESTRIL  Take 1 tablet by mouth once daily     loratadine 10 MG tablet  Commonly known as:  CLARITIN  Take 10 mg by mouth daily as needed for allergies.     LUBRICANT EYE DROPS OP  Apply 2 drops to eye 4 (  four) times daily as needed.     meloxicam 15 MG tablet  Commonly known as:  MOBIC  Take 1 tablet by mouth  daily     metFORMIN 1000 MG tablet  Commonly known as:  GLUCOPHAGE  take 1 tablet by mouth  twice daily with meal     multivitamin tablet  Take 1 tablet by mouth daily.     omeprazole 20 MG capsule  Commonly known as:  PRILOSEC  Take 20 mg by mouth 2 (two) times daily.     onetouch ultrasoft lancets  Use daily     oxyCODONE-acetaminophen 5-325 MG tablet  Commonly known as:  PERCOCET/ROXICET  Take 1 tablet by mouth every 4 (four) hours as needed for moderate pain or severe pain.     pregabalin 75 MG capsule  Commonly known as:  LYRICA  Take 75 mg by mouth daily.     tobramycin 0.3 % ophthalmic ointment  Commonly known as:  TOBREX  Place 1 application into the right eye 3 (three) times daily. When getting eye injections     traZODone 100 MG tablet  Commonly known as:  DESYREL  Take 200 mg by mouth at bedtime.         Physical Exam: Filed Vitals:   03/24/15 1118  BP: 105/53  Pulse: 95  Temp: 99.5 F (37.5 C)  TempSrc: Oral  Resp: 18  Height: 5\' 2"  (1.575 m)  Weight: 192 lb (87.091 kg)  SpO2: 95%    General- elderly female, obese, in no acute distress Head- normocephalic, atraumatic Nose- no maxillary or frontal sinus tenderness, no nasal discharge Throat- moist mucus membrane  Eyes- PERRLA, EOMI, no pallor, no icterus, no discharge, normal conjunctiva, normal sclera Neck- no cervical lymphadenopathy Cardiovascular- normal s1,s2, no murmurs, no leg edema Respiratory- bilateral clear to auscultation, no wheeze, no rhonchi, no crackles, no use of accessory muscles Abdomen- bowel sounds present, soft, non  tender Musculoskeletal- able to move all 4 extremities, generalized weakness, good dorsalis pedis Neurological- no focal deficit, alert and oriented to person, place and time Skin- warm and dry, surgical incision to the back with erythema and mild peri incision induration, no drainage, non tender and healing well Psychiatry- normal mood and affect    Labs reviewed: Basic Metabolic Panel:  Recent Labs  40/98/11 1441 01/13/15 0935 03/12/15 1359  NA 138 140 140  K 4.5 4.8 5.1  CL 103 101 103  CO2 26 28 28   GLUCOSE 88 128* 109*  BUN 14 16 16   CREATININE 0.79 0.72 0.79  CALCIUM 9.5 9.6 9.7   Liver Function Tests:  Recent Labs  01/13/15 0935  AST 19  ALT 27  ALKPHOS 95  BILITOT 0.3  PROT 7.0  ALBUMIN 4.4   No results for input(s): LIPASE, AMYLASE in the last 8760 hours. No results for input(s): AMMONIA in the last 8760 hours. CBC:  Recent Labs  08/22/14 1441 01/13/15 0935 03/12/15 1359  WBC 7.7 6.3 6.6  NEUTROABS  --  3.0  --   HGB 11.9* 12.1 11.7*  HCT 37.1 37.5 36.7  MCV 76.7* 78.6 78.6  PLT 192 226.0 222   Cardiac Enzymes: No results for input(s): CKTOTAL, CKMB, CKMBINDEX, TROPONINI in the last 8760 hours. BNP: Invalid input(s): POCBNP CBG:  Recent Labs  03/19/15 2129 03/20/15 0729 03/20/15 1146  GLUCAP 101* 134* 103*     Assessment/Plan  Lumbar spondylolisthesis S/p lumbar decompression. Continue percocet 5-325 mg 1 tab q4h prn pain. Discontinue norco per patient  request. Continue robaxin 500 mg q6h prn muscle spasm. Discontinue valium. Continue lyrica for radiculopathy. Back precautions and to wear her brace. Has f/u with neurosurgery. Will have her work with physical therapy and occupational therapy team to help with gait training and muscle strengthening exercises.fall precautions. Skin care. Encourage to be out of bed.   HTN bp on lower side of normal. Decrease lisinopril to 5 mg daily and continue atenolol 25 mg daily with holding  parametes. Check bp and HR bid for a week  Blood loss anemia Likely post op, monitor cbc  Constipation Start colace 100 mg bid with miralax daily x 3 days, then daily as needed, hydration to be maintained  Anxiety and depression Continue xanax 0.5 mg bid and celexa 40 mg daily. Continue trazodone  DM Lab Results  Component Value Date   HGBA1C 6.5* 03/12/2015  monitor cbg. Continue glimepiride 2 mg half a tab daily, metformin 1000 mg bid  Hypothyroidism Continue levothyroxine 150 mcg daily  gerd Stable symptom, contine prilosec 20 mg bid  Goals of care: short term rehabilitation   Labs/tests ordered: cbc, bmp  Family/ staff Communication: reviewed care plan with patient and nursing supervisor    Oneal Grout, MD  [facility] [phone] (Monday-Friday 8 am - 5 pm) [phone] (afterhours)

## 2015-03-26 HISTORY — PX: CATARACT EXTRACTION W/ INTRAOCULAR LENS  IMPLANT, BILATERAL: SHX1307

## 2015-03-27 ENCOUNTER — Non-Acute Institutional Stay (SKILLED_NURSING_FACILITY): Payer: 59 | Admitting: Adult Health

## 2015-03-27 ENCOUNTER — Encounter: Payer: Self-pay | Admitting: Adult Health

## 2015-03-27 DIAGNOSIS — K5900 Constipation, unspecified: Secondary | ICD-10-CM

## 2015-03-27 DIAGNOSIS — M549 Dorsalgia, unspecified: Secondary | ICD-10-CM

## 2015-03-27 DIAGNOSIS — I10 Essential (primary) hypertension: Secondary | ICD-10-CM

## 2015-03-27 DIAGNOSIS — Z79890 Hormone replacement therapy (postmenopausal): Secondary | ICD-10-CM | POA: Diagnosis not present

## 2015-03-27 DIAGNOSIS — F419 Anxiety disorder, unspecified: Secondary | ICD-10-CM

## 2015-03-27 DIAGNOSIS — G4700 Insomnia, unspecified: Secondary | ICD-10-CM | POA: Diagnosis not present

## 2015-03-27 DIAGNOSIS — M4316 Spondylolisthesis, lumbar region: Secondary | ICD-10-CM | POA: Diagnosis not present

## 2015-03-27 DIAGNOSIS — E785 Hyperlipidemia, unspecified: Secondary | ICD-10-CM | POA: Diagnosis not present

## 2015-03-27 DIAGNOSIS — F329 Major depressive disorder, single episode, unspecified: Secondary | ICD-10-CM | POA: Diagnosis not present

## 2015-03-27 DIAGNOSIS — E039 Hypothyroidism, unspecified: Secondary | ICD-10-CM | POA: Diagnosis not present

## 2015-03-27 DIAGNOSIS — K219 Gastro-esophageal reflux disease without esophagitis: Secondary | ICD-10-CM

## 2015-03-27 DIAGNOSIS — E119 Type 2 diabetes mellitus without complications: Secondary | ICD-10-CM | POA: Diagnosis not present

## 2015-03-27 DIAGNOSIS — F32A Depression, unspecified: Secondary | ICD-10-CM

## 2015-03-27 DIAGNOSIS — G8929 Other chronic pain: Secondary | ICD-10-CM

## 2015-03-27 NOTE — Progress Notes (Signed)
Patient ID: Katie Booker, female   DOB: December 08, 1945, 70 y.o.   MRN: [id number]    DATE:  03/27/15  MRN:  [id number]  BIRTHDAY: 1945/07/31  Facility:  Nursing Home Location:  [facility]  Nursing Home Room Number: [id number]  LEVEL OF CARE:  SNF [phone])  Contact Information    Name Relation Home Work Mobile   Jones,Nicole Daughter [phone]  [phone]   Vanita Panda [phone]       Code Status History    Date Active Date Inactive Code Status Order ID Comments User Context   03/18/2015  5:39 PM 03/20/2015  6:09 PM Full Code [id number]  Barnett Abu, MD Inpatient   09/02/2014 11:16 AM 09/03/2014  4:36 PM Full Code [id number]  Barnett Abu, MD Inpatient   12/15/2012  4:55 PM 12/20/2012  2:08 PM Full Code [id number]  Barnett Abu, MD Inpatient       Chief Complaint  Patient presents with  . Discharge Note    HISTORY OF PRESENT ILLNESS:  This is a 70 year old female who is for discharge home with Home health PT, OT, Nursing and CNA. DME:  Bedside commode. She has been admitted to [facility] on 03/20/15 from [facility] with Spondylolisthesis L1-L2 with myelopathy and radiculopathy S/P decompression of L1-L2 with posterior lumbar interbody arthrodesis pedicle screw fixation L1 to previous L2-L5  construct on 03/18/15.   Patient was admitted to this facility for short-term rehabilitation after the patient's recent hospitalization.  Patient has completed SNF rehabilitation and therapy has cleared the patient for discharge.   PAST MEDICAL HISTORY:  Past Medical History  Diagnosis Date  . HYPERLIPIDEMIA 09/18/2009    takes Lipitor daily  . HYPOTHYROIDISM 09/18/2009  . OSTEOARTHRITIS 09/18/2009  . Obesity   . Hemorrhoids   . Anal fissure   . Anxiety     takes Xanax prn sleep and anxiety  . DEPRESSION 09/18/2009    takes Celexa daily  . HYPERTENSION 09/18/2009    takes Atenolol daily and Lisinopril  . Histoplasmosis     hx of   . Shortness of breath     with exertion  . OSA (obstructive sleep apnea)     has a CPAP and sleep study report in epic from 2009  . History of bronchitis     last time around 1990  . Headache(784.0)     sinus HA  . Peripheral neuropathy (HCC)     takes Lyrica daily  . Joint pain   . Joint swelling   . Chronic back pain     scoliosis/stenosis/spondylosis  . Skin spots, red     right above both elbows;pt states always there  . GERD 09/18/2009    takes Omeprazole daily  . Umbilical hernia   . ANEMIA-NOS 09/18/2009    takes Ferrous Sulfate daily  . History of colon polyps   . Urinary frequency   . Urinary urgency   . Insomnia     takes trazodone and ambien nightly  . Cancer (HCC)     basal cell left cheek  . DIABETES MELLITUS, TYPE II 09/18/2009    takes Amaryl daily and Metformin  . Spondylolisthesis at L1-L2 level   . Morbid obesity (HCC)      CURRENT MEDICATIONS: Reviewed  Patient's Medications  New Prescriptions   LISINOPRIL (PRINIVIL,ZESTRIL) 10 MG TABLET    Take 1 tablet by mouth once daily  Previous Medications   ALPRAZOLAM (XANAX) 0.5 MG TABLET    Take  1 tablet (0.5 mg total) by mouth 2 (two) times daily.   ATENOLOL (TENORMIN) 25 MG TABLET    Take 25 mg by mouth daily. Hold if SBP <100 and HR <60   ATORVASTATIN (LIPITOR) 40 MG TABLET    Take 1 tablet by mouth once daily   CALCIUM CARB-CHOLECALCIFEROL (CALCIUM 600/VITAMIN D3 PO)    Take 1 tablet by mouth 2 (two) times daily.   CARBOXYMETHYLCELLULOSE SODIUM (LUBRICANT EYE DROPS OP)    Apply 2 drops to eye 4 (four) times daily as needed.   DOCUSATE SODIUM (COLACE) 100 MG CAPSULE    Take 100 mg by mouth 2 (two) times daily.   ESTRADIOL (ESTRACE) 0.1 MG/GM VAGINAL CREAM    Place 1 Applicatorful vaginally 3 (three) times a week.   GEMFIBROZIL (LOPID) 600 MG TABLET    Take one tablet by mouth  twice daily before meals   GLIMEPIRIDE (AMARYL) 2 MG TABLET    Take one-half tablet by  mouth daily   GLUCOSE BLOOD (ONE TOUCH  ULTRA TEST) TEST STRIP    1 each by Other route daily. Use as instructed   HYDROCORTISONE 2.5 % CREAM    Apply 1 application topically 2 (two) times daily.   LANCETS (ONETOUCH ULTRASOFT) LANCETS    Use daily   LEVOTHYROXINE (SYNTHROID, LEVOTHROID) 150 MCG TABLET    Take 1 tablet by mouth once daily   LISINOPRIL (PRINIVIL,ZESTRIL) 5 MG TABLET    Take 5 mg by mouth daily. Hold for SBP <110   LORATADINE (CLARITIN) 10 MG TABLET    Take 10 mg by mouth daily as needed for allergies.   MELOXICAM (MOBIC) 15 MG TABLET    Take 1 tablet by mouth  daily   METFORMIN (GLUCOPHAGE) 1000 MG TABLET    take 1 tablet by mouth  twice daily with meal   METHOCARBAMOL (ROBAXIN) 500 MG TABLET    Take 500 mg by mouth every 6 (six) hours as needed for muscle spasms.   MULTIPLE VITAMIN (MULTIVITAMIN) TABLET    Take 1 tablet by mouth daily.   OMEGA-3 FATTY ACIDS (FISH OIL) 1200 MG CAPS    Take 2 capsules by mouth 2 (two) times daily.   OMEPRAZOLE (PRILOSEC) 20 MG CAPSULE    Take 20 mg by mouth 2 (two) times daily.   OXYCODONE-ACETAMINOPHEN (PERCOCET/ROXICET) 5-325 MG TABLET    Take 1 tablet by mouth every 6 (six) hours as needed for moderate pain or severe pain.    OXYCODONE-ACETAMINOPHEN (PERCOCET/ROXICET) 5-325 MG TABLET    Take 1 tablet by mouth. At 6AM, 12PM, 6PM   POLYETHYLENE GLYCOL (MIRALAX / GLYCOLAX) PACKET    Take 17 g by mouth daily. For 3 days, then daily as needed.  Stop 03/28/15.   PREGABALIN (LYRICA) 75 MG CAPSULE    Take 75 mg by mouth daily.   TOBRAMYCIN (TOBREX) 0.3 % OPHTHALMIC OINTMENT    Place 1 application into the right eye. Apply the day before, the day of, and the day after eye injections.   TRAZODONE (DESYREL) 100 MG TABLET    Take 200 mg by mouth at bedtime.  Modified Medications   Modified Medication Previous Medication   CITALOPRAM (CELEXA) 40 MG TABLET citalopram (CELEXA) 40 MG tablet      Take 1 tablet by mouth  daily    Take 1 tablet by mouth  daily   HYDROCODONE-ACETAMINOPHEN (NORCO/VICODIN)  5-325 MG TABLET HYDROcodone-acetaminophen (NORCO/VICODIN) 5-325 MG tablet      1 by mouth every 6  hours as needed for pain **DO NOT EXCEED 4 GM OF TYLENOL IN 24 HOURS**    1 by mouth every 6 hours as needed for pain **DO NOT EXCEED 4 GM OF TYLENOL IN 24 HOURS**  Discontinued Medications   ATENOLOL (TENORMIN) 25 MG TABLET    Take 1 tablet by mouth  daily     Allergies  Allergen Reactions  . Duraprep [Tersaseptic] Itching and Rash    Irritation everywhere prep was used  . Hydromorphone Hcl     Makes crazy  . Morphine And Related Other (See Comments)    hallucinations     REVIEW OF SYSTEMS:  GENERAL: no change in appetite, no fatigue, no weight changes, no fever, chills or weakness EYES: Denies change in vision, dry eyes, eye pain, itching or discharge EARS: Denies change in hearing, ringing in ears, or earache NOSE: Denies nasal congestion or epistaxis MOUTH and THROAT: Denies oral discomfort, gingival pain or bleeding, pain from teeth or hoarseness   RESPIRATORY: no cough, SOB, DOE, wheezing, hemoptysis CARDIAC: no chest pain, edema or palpitations GI: no abdominal pain, diarrhea, constipation, heart burn, nausea or vomiting GU: Denies dysuria, frequency, hematuria, incontinence, or discharge PSYCHIATRIC: Denies feeling of depression or anxiety. No report of hallucinations, insomnia, paranoia, or agitation   PHYSICAL EXAMINATION  GENERAL APPEARANCE: Well nourished. In no acute distress. Normal body habitus SKIN:  Midline lower back incision is dry, no erythema HEAD: Normal in size and contour. No evidence of trauma EYES: Lids open and close normally. No blepharitis, entropion or ectropion. PERRL. Conjunctivae are clear and sclerae are white. Lenses are without opacity EARS: Pinnae are normal. Patient hears normal voice tunes of the examiner MOUTH and THROAT: Lips are without lesions. Oral mucosa is moist and without lesions. Tongue is normal in shape, size, and color and without  lesions NECK: supple, trachea midline, no neck masses, no thyroid tenderness, no thyromegaly LYMPHATICS: no LAN in the neck, no supraclavicular LAN RESPIRATORY: breathing is even & unlabored, BS CTAB CARDIAC: RRR, no murmur,no extra heart sounds, no edema GI: abdomen soft, normal BS, no masses, no tenderness, no hepatomegaly, no splenomegaly EXTREMITIES:  Able to move X 4 extremities PSYCHIATRIC: Alert and oriented X 3. Affect and behavior are appropriate  LABS/RADIOLOGY: Labs reviewed: 03/27/15  WBC 7.9 hemoglobin 9.9 hematocrit 31.7 MCV 79.3 platelet 260 sodium 139 potassium 4.3 glucose 95 BUN 16 creatinine 0.84 calcium 9.0 Basic Metabolic Panel:  Recent Labs  16/10/96 1441 01/13/15 0935 03/12/15 1359  NA 138 140 140  K 4.5 4.8 5.1  CL 103 101 103  CO2 GLUCOSE 88 128* 109*  BUN CREATININE 0.79 0.72 0.79  CALCIUM 9.5 9.6 9.7   Liver Function Tests:  Recent Labs  01/13/15 0935  AST 19  ALT 27  ALKPHOS 95  BILITOT 0.3  PROT 7.0  ALBUMIN 4.4   CBC:  Recent Labs  08/22/14 1441 01/13/15 0935 03/12/15 1359  WBC 7.7 6.3 6.6  NEUTROABS  --  3.0  --   HGB 11.9* 12.1 11.7*  HCT 37.1 37.5 36.7  MCV 76.7* 78.6 78.6  PLT 192 226.0 222   Lipid Panel:  Recent Labs  01/13/15 0935  HDL 54.80   CBG:  Recent Labs  03/19/15 2129 03/20/15 0729 03/20/15 1146  GLUCAP 101* 134* 103*     No results found.  ASSESSMENT/PLAN:  Spondylolisthesis L1-L2 with myelopathy and radiculopathy S/P decompression of of L1-L2 with posterior lumbar interbody arthrodesis  pedicle screw fixation L1 to previous L2-L5 construct - for Home health PT, OT,  For painNursing and CNA; continue Robaxin 500 mg 1 tab PO Q 6 hours PRN for muscle spasm;  Percocet 5/325 mg  1-2 tabs PO Q 6 hours PRN; follow-up with Dr. Danielle Dess, neurosurgeon  Hypothyroidism - tsh 3.39; continue Synthroid 150 mcg 1 tab PO daily  Diabetes Mellitus, type 2 hgbA1c 6.5; continue Amaryl 2 mg 1/2 tab  = 1 mg PO daily and Metformin 1,000 mg PO BID  HRT - continue Estrrace 0.1mg /gm 1 application 3X/week  Depression - mood is stable; continue Celexa 40 mg 1 tab PO daily  Hyperlipidemia - continue Atorvastatin 40 mg 1 tab po daily, Fish Oil 1,200 mg 2 caps PO daily and Lopid 600 mg PO BID  Hypertension -  Continue Atenolol 25 mg 1 tab PO daily, recently decreased Lisinopril 5 mg 1 tab PO daily  Chronic back pain -  Pain is well-controlled; continue Lyrica 75 mg 1 capsule PO daily  Anxiety - continue Xanax 0.5 mg 1 tab PO BID  GERD - continue Prilosec 20 mg 1 capsule PO daily  Insomnia - continue Trazodone 100 mg  2 tabs PO Q HS  Constipation - continue Colace 100 mg PO BID and Miralax 17 gm PO Daily PRN      I have filled out patient's discharge paperwork and written prescriptions.  Patient will receive home health PT, OT, ST, Nursing and CNA.  DME provided:  Bedside commode  Total discharge time: Greater than 30 minutes  Discharge time involved coordination of the discharge process with social worker, nursing staff and therapy department. Medical justification for home health services/DME verified.    [facility], NP [employer] [phone]

## 2015-03-30 DIAGNOSIS — M4326 Fusion of spine, lumbar region: Secondary | ICD-10-CM | POA: Diagnosis not present

## 2015-03-30 DIAGNOSIS — E1142 Type 2 diabetes mellitus with diabetic polyneuropathy: Secondary | ICD-10-CM | POA: Diagnosis not present

## 2015-03-30 DIAGNOSIS — M4322 Fusion of spine, cervical region: Secondary | ICD-10-CM | POA: Diagnosis not present

## 2015-03-30 DIAGNOSIS — Z4789 Encounter for other orthopedic aftercare: Secondary | ICD-10-CM | POA: Diagnosis not present

## 2015-03-31 ENCOUNTER — Telehealth: Payer: Self-pay | Admitting: Internal Medicine

## 2015-03-31 DIAGNOSIS — E1142 Type 2 diabetes mellitus with diabetic polyneuropathy: Secondary | ICD-10-CM | POA: Diagnosis not present

## 2015-03-31 DIAGNOSIS — M4326 Fusion of spine, lumbar region: Secondary | ICD-10-CM | POA: Diagnosis not present

## 2015-03-31 DIAGNOSIS — Z4789 Encounter for other orthopedic aftercare: Secondary | ICD-10-CM | POA: Diagnosis not present

## 2015-03-31 DIAGNOSIS — M4322 Fusion of spine, cervical region: Secondary | ICD-10-CM | POA: Diagnosis not present

## 2015-03-31 NOTE — Telephone Encounter (Signed)
Spoke to [place], verbal orders given for Skilled Nursing, PT and OT for pt okay per Dr.K. Marylene Land verbalized understanding.

## 2015-03-31 NOTE — Telephone Encounter (Signed)
ok 

## 2015-03-31 NOTE — Telephone Encounter (Signed)
Okay for orders for Skilled Nursing, PT and OT?

## 2015-03-31 NOTE — Telephone Encounter (Signed)
Katie Booker would like order for PT, OT and skilled nursing. Pt was at [facility] for spinal fusion . Verbal order is ok to leave on voice mail

## 2015-04-01 DIAGNOSIS — E1142 Type 2 diabetes mellitus with diabetic polyneuropathy: Secondary | ICD-10-CM | POA: Diagnosis not present

## 2015-04-01 DIAGNOSIS — Z4789 Encounter for other orthopedic aftercare: Secondary | ICD-10-CM | POA: Diagnosis not present

## 2015-04-01 DIAGNOSIS — M4322 Fusion of spine, cervical region: Secondary | ICD-10-CM | POA: Diagnosis not present

## 2015-04-01 DIAGNOSIS — M4326 Fusion of spine, lumbar region: Secondary | ICD-10-CM | POA: Diagnosis not present

## 2015-04-02 ENCOUNTER — Telehealth: Payer: Self-pay | Admitting: Internal Medicine

## 2015-04-02 DIAGNOSIS — M4326 Fusion of spine, lumbar region: Secondary | ICD-10-CM | POA: Diagnosis not present

## 2015-04-02 DIAGNOSIS — M4322 Fusion of spine, cervical region: Secondary | ICD-10-CM | POA: Diagnosis not present

## 2015-04-02 DIAGNOSIS — Z4789 Encounter for other orthopedic aftercare: Secondary | ICD-10-CM | POA: Diagnosis not present

## 2015-04-02 DIAGNOSIS — E1142 Type 2 diabetes mellitus with diabetic polyneuropathy: Secondary | ICD-10-CM | POA: Diagnosis not present

## 2015-04-02 NOTE — Telephone Encounter (Signed)
[place] needs verbal order for OT. Twice a wk for 6 wks.

## 2015-04-03 DIAGNOSIS — M4326 Fusion of spine, lumbar region: Secondary | ICD-10-CM | POA: Diagnosis not present

## 2015-04-03 DIAGNOSIS — Z4789 Encounter for other orthopedic aftercare: Secondary | ICD-10-CM | POA: Diagnosis not present

## 2015-04-03 DIAGNOSIS — E1142 Type 2 diabetes mellitus with diabetic polyneuropathy: Secondary | ICD-10-CM | POA: Diagnosis not present

## 2015-04-03 DIAGNOSIS — M4322 Fusion of spine, cervical region: Secondary | ICD-10-CM | POA: Diagnosis not present

## 2015-04-03 NOTE — Telephone Encounter (Signed)
ok 

## 2015-04-03 NOTE — Telephone Encounter (Signed)
Left detailed message on voicemail, verbal order given for Occupational Therapy twice a week for 6 weeks for pt okay per Dr.K. Any questions please call office.

## 2015-04-03 NOTE — Telephone Encounter (Signed)
Okay for Occupational Therapy? 

## 2015-04-04 DIAGNOSIS — E1142 Type 2 diabetes mellitus with diabetic polyneuropathy: Secondary | ICD-10-CM | POA: Diagnosis not present

## 2015-04-04 DIAGNOSIS — M4326 Fusion of spine, lumbar region: Secondary | ICD-10-CM | POA: Diagnosis not present

## 2015-04-04 DIAGNOSIS — Z4789 Encounter for other orthopedic aftercare: Secondary | ICD-10-CM | POA: Diagnosis not present

## 2015-04-04 DIAGNOSIS — M4322 Fusion of spine, cervical region: Secondary | ICD-10-CM | POA: Diagnosis not present

## 2015-04-07 DIAGNOSIS — M4326 Fusion of spine, lumbar region: Secondary | ICD-10-CM | POA: Diagnosis not present

## 2015-04-07 DIAGNOSIS — Z4789 Encounter for other orthopedic aftercare: Secondary | ICD-10-CM | POA: Diagnosis not present

## 2015-04-07 DIAGNOSIS — E1142 Type 2 diabetes mellitus with diabetic polyneuropathy: Secondary | ICD-10-CM | POA: Diagnosis not present

## 2015-04-07 DIAGNOSIS — M4322 Fusion of spine, cervical region: Secondary | ICD-10-CM | POA: Diagnosis not present

## 2015-04-08 DIAGNOSIS — Z4789 Encounter for other orthopedic aftercare: Secondary | ICD-10-CM | POA: Diagnosis not present

## 2015-04-08 DIAGNOSIS — E1142 Type 2 diabetes mellitus with diabetic polyneuropathy: Secondary | ICD-10-CM | POA: Diagnosis not present

## 2015-04-08 DIAGNOSIS — M4326 Fusion of spine, lumbar region: Secondary | ICD-10-CM | POA: Diagnosis not present

## 2015-04-08 DIAGNOSIS — M4322 Fusion of spine, cervical region: Secondary | ICD-10-CM | POA: Diagnosis not present

## 2015-04-09 DIAGNOSIS — M4806 Spinal stenosis, lumbar region: Secondary | ICD-10-CM | POA: Diagnosis not present

## 2015-04-10 DIAGNOSIS — Z4789 Encounter for other orthopedic aftercare: Secondary | ICD-10-CM | POA: Diagnosis not present

## 2015-04-10 DIAGNOSIS — M4322 Fusion of spine, cervical region: Secondary | ICD-10-CM | POA: Diagnosis not present

## 2015-04-10 DIAGNOSIS — M4326 Fusion of spine, lumbar region: Secondary | ICD-10-CM | POA: Diagnosis not present

## 2015-04-10 DIAGNOSIS — E1142 Type 2 diabetes mellitus with diabetic polyneuropathy: Secondary | ICD-10-CM | POA: Diagnosis not present

## 2015-04-11 DIAGNOSIS — E1142 Type 2 diabetes mellitus with diabetic polyneuropathy: Secondary | ICD-10-CM | POA: Diagnosis not present

## 2015-04-11 DIAGNOSIS — M4326 Fusion of spine, lumbar region: Secondary | ICD-10-CM | POA: Diagnosis not present

## 2015-04-11 DIAGNOSIS — Z4789 Encounter for other orthopedic aftercare: Secondary | ICD-10-CM | POA: Diagnosis not present

## 2015-04-11 DIAGNOSIS — M4322 Fusion of spine, cervical region: Secondary | ICD-10-CM | POA: Diagnosis not present

## 2015-04-14 DIAGNOSIS — M4326 Fusion of spine, lumbar region: Secondary | ICD-10-CM | POA: Diagnosis not present

## 2015-04-14 DIAGNOSIS — M4322 Fusion of spine, cervical region: Secondary | ICD-10-CM | POA: Diagnosis not present

## 2015-04-14 DIAGNOSIS — E1142 Type 2 diabetes mellitus with diabetic polyneuropathy: Secondary | ICD-10-CM | POA: Diagnosis not present

## 2015-04-14 DIAGNOSIS — Z4789 Encounter for other orthopedic aftercare: Secondary | ICD-10-CM | POA: Diagnosis not present

## 2015-04-15 DIAGNOSIS — Z4789 Encounter for other orthopedic aftercare: Secondary | ICD-10-CM | POA: Diagnosis not present

## 2015-04-15 DIAGNOSIS — E1142 Type 2 diabetes mellitus with diabetic polyneuropathy: Secondary | ICD-10-CM | POA: Diagnosis not present

## 2015-04-15 DIAGNOSIS — M4326 Fusion of spine, lumbar region: Secondary | ICD-10-CM | POA: Diagnosis not present

## 2015-04-15 DIAGNOSIS — M4322 Fusion of spine, cervical region: Secondary | ICD-10-CM | POA: Diagnosis not present

## 2015-04-16 DIAGNOSIS — M4322 Fusion of spine, cervical region: Secondary | ICD-10-CM | POA: Diagnosis not present

## 2015-04-16 DIAGNOSIS — M4326 Fusion of spine, lumbar region: Secondary | ICD-10-CM | POA: Diagnosis not present

## 2015-04-16 DIAGNOSIS — Z4789 Encounter for other orthopedic aftercare: Secondary | ICD-10-CM | POA: Diagnosis not present

## 2015-04-16 DIAGNOSIS — E1142 Type 2 diabetes mellitus with diabetic polyneuropathy: Secondary | ICD-10-CM | POA: Diagnosis not present

## 2015-04-17 DIAGNOSIS — E1142 Type 2 diabetes mellitus with diabetic polyneuropathy: Secondary | ICD-10-CM | POA: Diagnosis not present

## 2015-04-17 DIAGNOSIS — M4326 Fusion of spine, lumbar region: Secondary | ICD-10-CM | POA: Diagnosis not present

## 2015-04-17 DIAGNOSIS — Z4789 Encounter for other orthopedic aftercare: Secondary | ICD-10-CM | POA: Diagnosis not present

## 2015-04-17 DIAGNOSIS — M4322 Fusion of spine, cervical region: Secondary | ICD-10-CM | POA: Diagnosis not present

## 2015-04-19 ENCOUNTER — Other Ambulatory Visit: Payer: Self-pay | Admitting: Internal Medicine

## 2015-04-21 DIAGNOSIS — H2513 Age-related nuclear cataract, bilateral: Secondary | ICD-10-CM | POA: Diagnosis not present

## 2015-04-21 DIAGNOSIS — H25811 Combined forms of age-related cataract, right eye: Secondary | ICD-10-CM | POA: Diagnosis not present

## 2015-04-21 DIAGNOSIS — H2511 Age-related nuclear cataract, right eye: Secondary | ICD-10-CM | POA: Diagnosis not present

## 2015-04-22 DIAGNOSIS — M4326 Fusion of spine, lumbar region: Secondary | ICD-10-CM | POA: Diagnosis not present

## 2015-04-22 DIAGNOSIS — Z4789 Encounter for other orthopedic aftercare: Secondary | ICD-10-CM | POA: Diagnosis not present

## 2015-04-22 DIAGNOSIS — M479 Spondylosis, unspecified: Secondary | ICD-10-CM | POA: Diagnosis not present

## 2015-04-22 DIAGNOSIS — E785 Hyperlipidemia, unspecified: Secondary | ICD-10-CM | POA: Diagnosis not present

## 2015-04-22 DIAGNOSIS — M4322 Fusion of spine, cervical region: Secondary | ICD-10-CM | POA: Diagnosis not present

## 2015-04-22 DIAGNOSIS — E039 Hypothyroidism, unspecified: Secondary | ICD-10-CM | POA: Diagnosis not present

## 2015-04-22 DIAGNOSIS — M1990 Unspecified osteoarthritis, unspecified site: Secondary | ICD-10-CM | POA: Diagnosis not present

## 2015-04-22 DIAGNOSIS — K219 Gastro-esophageal reflux disease without esophagitis: Secondary | ICD-10-CM | POA: Diagnosis not present

## 2015-04-22 DIAGNOSIS — I10 Essential (primary) hypertension: Secondary | ICD-10-CM | POA: Diagnosis not present

## 2015-04-22 DIAGNOSIS — E1142 Type 2 diabetes mellitus with diabetic polyneuropathy: Secondary | ICD-10-CM | POA: Diagnosis not present

## 2015-04-22 DIAGNOSIS — H2512 Age-related nuclear cataract, left eye: Secondary | ICD-10-CM | POA: Diagnosis not present

## 2015-04-23 DIAGNOSIS — E785 Hyperlipidemia, unspecified: Secondary | ICD-10-CM | POA: Diagnosis not present

## 2015-04-23 DIAGNOSIS — M479 Spondylosis, unspecified: Secondary | ICD-10-CM | POA: Diagnosis not present

## 2015-04-23 DIAGNOSIS — M4322 Fusion of spine, cervical region: Secondary | ICD-10-CM | POA: Diagnosis not present

## 2015-04-23 DIAGNOSIS — E1142 Type 2 diabetes mellitus with diabetic polyneuropathy: Secondary | ICD-10-CM | POA: Diagnosis not present

## 2015-04-23 DIAGNOSIS — E039 Hypothyroidism, unspecified: Secondary | ICD-10-CM | POA: Diagnosis not present

## 2015-04-23 DIAGNOSIS — Z4789 Encounter for other orthopedic aftercare: Secondary | ICD-10-CM | POA: Diagnosis not present

## 2015-04-23 DIAGNOSIS — I10 Essential (primary) hypertension: Secondary | ICD-10-CM | POA: Diagnosis not present

## 2015-04-23 DIAGNOSIS — M1990 Unspecified osteoarthritis, unspecified site: Secondary | ICD-10-CM | POA: Diagnosis not present

## 2015-04-23 DIAGNOSIS — M4326 Fusion of spine, lumbar region: Secondary | ICD-10-CM | POA: Diagnosis not present

## 2015-04-23 DIAGNOSIS — K219 Gastro-esophageal reflux disease without esophagitis: Secondary | ICD-10-CM | POA: Diagnosis not present

## 2015-04-24 ENCOUNTER — Other Ambulatory Visit: Payer: Self-pay | Admitting: Internal Medicine

## 2015-04-24 DIAGNOSIS — E785 Hyperlipidemia, unspecified: Secondary | ICD-10-CM | POA: Diagnosis not present

## 2015-04-24 DIAGNOSIS — I10 Essential (primary) hypertension: Secondary | ICD-10-CM | POA: Diagnosis not present

## 2015-04-24 DIAGNOSIS — M479 Spondylosis, unspecified: Secondary | ICD-10-CM | POA: Diagnosis not present

## 2015-04-24 DIAGNOSIS — K219 Gastro-esophageal reflux disease without esophagitis: Secondary | ICD-10-CM | POA: Diagnosis not present

## 2015-04-24 DIAGNOSIS — E1142 Type 2 diabetes mellitus with diabetic polyneuropathy: Secondary | ICD-10-CM | POA: Diagnosis not present

## 2015-04-24 DIAGNOSIS — M4322 Fusion of spine, cervical region: Secondary | ICD-10-CM | POA: Diagnosis not present

## 2015-04-24 DIAGNOSIS — E039 Hypothyroidism, unspecified: Secondary | ICD-10-CM | POA: Diagnosis not present

## 2015-04-24 DIAGNOSIS — Z4789 Encounter for other orthopedic aftercare: Secondary | ICD-10-CM | POA: Diagnosis not present

## 2015-04-24 DIAGNOSIS — M1990 Unspecified osteoarthritis, unspecified site: Secondary | ICD-10-CM | POA: Diagnosis not present

## 2015-04-24 DIAGNOSIS — M4326 Fusion of spine, lumbar region: Secondary | ICD-10-CM | POA: Diagnosis not present

## 2015-04-24 MED ORDER — HYDROCODONE-ACETAMINOPHEN 5-325 MG PO TABS: ORAL_TABLET | ORAL | Status: DC

## 2015-04-24 NOTE — Telephone Encounter (Signed)
Pt needs new rx hydrocodone °

## 2015-04-25 NOTE — Telephone Encounter (Signed)
Left message on machine Rx ready for pick up 

## 2015-04-28 ENCOUNTER — Ambulatory Visit: Payer: Medicare Other | Admitting: Internal Medicine

## 2015-04-29 DIAGNOSIS — E785 Hyperlipidemia, unspecified: Secondary | ICD-10-CM | POA: Diagnosis not present

## 2015-04-29 DIAGNOSIS — Z4789 Encounter for other orthopedic aftercare: Secondary | ICD-10-CM | POA: Diagnosis not present

## 2015-04-29 DIAGNOSIS — K219 Gastro-esophageal reflux disease without esophagitis: Secondary | ICD-10-CM | POA: Diagnosis not present

## 2015-04-29 DIAGNOSIS — M479 Spondylosis, unspecified: Secondary | ICD-10-CM | POA: Diagnosis not present

## 2015-04-29 DIAGNOSIS — I10 Essential (primary) hypertension: Secondary | ICD-10-CM | POA: Diagnosis not present

## 2015-04-29 DIAGNOSIS — M4326 Fusion of spine, lumbar region: Secondary | ICD-10-CM | POA: Diagnosis not present

## 2015-04-29 DIAGNOSIS — E1142 Type 2 diabetes mellitus with diabetic polyneuropathy: Secondary | ICD-10-CM | POA: Diagnosis not present

## 2015-04-29 DIAGNOSIS — M4322 Fusion of spine, cervical region: Secondary | ICD-10-CM | POA: Diagnosis not present

## 2015-04-29 DIAGNOSIS — M1990 Unspecified osteoarthritis, unspecified site: Secondary | ICD-10-CM | POA: Diagnosis not present

## 2015-04-29 DIAGNOSIS — E039 Hypothyroidism, unspecified: Secondary | ICD-10-CM | POA: Diagnosis not present

## 2015-04-30 DIAGNOSIS — Z4789 Encounter for other orthopedic aftercare: Secondary | ICD-10-CM | POA: Diagnosis not present

## 2015-04-30 DIAGNOSIS — E039 Hypothyroidism, unspecified: Secondary | ICD-10-CM | POA: Diagnosis not present

## 2015-04-30 DIAGNOSIS — M4326 Fusion of spine, lumbar region: Secondary | ICD-10-CM | POA: Diagnosis not present

## 2015-04-30 DIAGNOSIS — M4322 Fusion of spine, cervical region: Secondary | ICD-10-CM | POA: Diagnosis not present

## 2015-04-30 DIAGNOSIS — E785 Hyperlipidemia, unspecified: Secondary | ICD-10-CM | POA: Diagnosis not present

## 2015-04-30 DIAGNOSIS — M479 Spondylosis, unspecified: Secondary | ICD-10-CM | POA: Diagnosis not present

## 2015-04-30 DIAGNOSIS — E1142 Type 2 diabetes mellitus with diabetic polyneuropathy: Secondary | ICD-10-CM | POA: Diagnosis not present

## 2015-04-30 DIAGNOSIS — K219 Gastro-esophageal reflux disease without esophagitis: Secondary | ICD-10-CM | POA: Diagnosis not present

## 2015-04-30 DIAGNOSIS — M1990 Unspecified osteoarthritis, unspecified site: Secondary | ICD-10-CM | POA: Diagnosis not present

## 2015-04-30 DIAGNOSIS — I10 Essential (primary) hypertension: Secondary | ICD-10-CM | POA: Diagnosis not present

## 2015-05-01 DIAGNOSIS — M1990 Unspecified osteoarthritis, unspecified site: Secondary | ICD-10-CM | POA: Diagnosis not present

## 2015-05-01 DIAGNOSIS — E039 Hypothyroidism, unspecified: Secondary | ICD-10-CM | POA: Diagnosis not present

## 2015-05-01 DIAGNOSIS — M4326 Fusion of spine, lumbar region: Secondary | ICD-10-CM | POA: Diagnosis not present

## 2015-05-01 DIAGNOSIS — I10 Essential (primary) hypertension: Secondary | ICD-10-CM | POA: Diagnosis not present

## 2015-05-01 DIAGNOSIS — E1142 Type 2 diabetes mellitus with diabetic polyneuropathy: Secondary | ICD-10-CM | POA: Diagnosis not present

## 2015-05-01 DIAGNOSIS — M4322 Fusion of spine, cervical region: Secondary | ICD-10-CM | POA: Diagnosis not present

## 2015-05-01 DIAGNOSIS — M479 Spondylosis, unspecified: Secondary | ICD-10-CM | POA: Diagnosis not present

## 2015-05-01 DIAGNOSIS — K219 Gastro-esophageal reflux disease without esophagitis: Secondary | ICD-10-CM | POA: Diagnosis not present

## 2015-05-01 DIAGNOSIS — E785 Hyperlipidemia, unspecified: Secondary | ICD-10-CM | POA: Diagnosis not present

## 2015-05-01 DIAGNOSIS — Z4789 Encounter for other orthopedic aftercare: Secondary | ICD-10-CM | POA: Diagnosis not present

## 2015-05-02 DIAGNOSIS — E1142 Type 2 diabetes mellitus with diabetic polyneuropathy: Secondary | ICD-10-CM | POA: Diagnosis not present

## 2015-05-02 DIAGNOSIS — K219 Gastro-esophageal reflux disease without esophagitis: Secondary | ICD-10-CM | POA: Diagnosis not present

## 2015-05-02 DIAGNOSIS — M4326 Fusion of spine, lumbar region: Secondary | ICD-10-CM | POA: Diagnosis not present

## 2015-05-02 DIAGNOSIS — E039 Hypothyroidism, unspecified: Secondary | ICD-10-CM | POA: Diagnosis not present

## 2015-05-02 DIAGNOSIS — M479 Spondylosis, unspecified: Secondary | ICD-10-CM | POA: Diagnosis not present

## 2015-05-02 DIAGNOSIS — I10 Essential (primary) hypertension: Secondary | ICD-10-CM | POA: Diagnosis not present

## 2015-05-02 DIAGNOSIS — M1990 Unspecified osteoarthritis, unspecified site: Secondary | ICD-10-CM | POA: Diagnosis not present

## 2015-05-02 DIAGNOSIS — E785 Hyperlipidemia, unspecified: Secondary | ICD-10-CM | POA: Diagnosis not present

## 2015-05-02 DIAGNOSIS — Z4789 Encounter for other orthopedic aftercare: Secondary | ICD-10-CM | POA: Diagnosis not present

## 2015-05-02 DIAGNOSIS — M4322 Fusion of spine, cervical region: Secondary | ICD-10-CM | POA: Diagnosis not present

## 2015-05-06 DIAGNOSIS — H353211 Exudative age-related macular degeneration, right eye, with active choroidal neovascularization: Secondary | ICD-10-CM | POA: Diagnosis not present

## 2015-05-06 DIAGNOSIS — H353122 Nonexudative age-related macular degeneration, left eye, intermediate dry stage: Secondary | ICD-10-CM | POA: Diagnosis not present

## 2015-05-06 DIAGNOSIS — H43813 Vitreous degeneration, bilateral: Secondary | ICD-10-CM | POA: Diagnosis not present

## 2015-05-06 LAB — HM DIABETES EYE EXAM

## 2015-05-11 ENCOUNTER — Encounter: Payer: Self-pay | Admitting: Adult Health

## 2015-05-11 NOTE — Progress Notes (Signed)
This encounter was created in error - please disregard.

## 2015-05-12 DIAGNOSIS — H2512 Age-related nuclear cataract, left eye: Secondary | ICD-10-CM | POA: Diagnosis not present

## 2015-05-12 DIAGNOSIS — H2513 Age-related nuclear cataract, bilateral: Secondary | ICD-10-CM | POA: Diagnosis not present

## 2015-05-12 DIAGNOSIS — H25812 Combined forms of age-related cataract, left eye: Secondary | ICD-10-CM | POA: Diagnosis not present

## 2015-05-23 ENCOUNTER — Telehealth: Payer: Self-pay | Admitting: Internal Medicine

## 2015-05-23 MED ORDER — HYDROCODONE-ACETAMINOPHEN 5-325 MG PO TABS: ORAL_TABLET | ORAL | Status: DC

## 2015-05-23 NOTE — Telephone Encounter (Signed)
Pt notified Rx ready for pickup. Rx printed and signed.  

## 2015-05-23 NOTE — Telephone Encounter (Signed)
° ° ° ° ° °  Pt request refill of the following: ° °HYDROcodone-acetaminophen (NORCO/VICODIN) 5-325 MG tablet ° ° °Phamacy: °

## 2015-06-01 ENCOUNTER — Other Ambulatory Visit: Payer: Self-pay | Admitting: Internal Medicine

## 2015-06-03 DIAGNOSIS — H35423 Microcystoid degeneration of retina, bilateral: Secondary | ICD-10-CM | POA: Diagnosis not present

## 2015-06-03 DIAGNOSIS — H353211 Exudative age-related macular degeneration, right eye, with active choroidal neovascularization: Secondary | ICD-10-CM | POA: Diagnosis not present

## 2015-06-03 DIAGNOSIS — H35433 Paving stone degeneration of retina, bilateral: Secondary | ICD-10-CM | POA: Diagnosis not present

## 2015-06-03 DIAGNOSIS — H353123 Nonexudative age-related macular degeneration, left eye, advanced atrophic without subfoveal involvement: Secondary | ICD-10-CM | POA: Diagnosis not present

## 2015-06-20 ENCOUNTER — Telehealth: Payer: Self-pay | Admitting: Internal Medicine

## 2015-06-20 ENCOUNTER — Other Ambulatory Visit: Payer: Self-pay | Admitting: Internal Medicine

## 2015-06-20 NOTE — Telephone Encounter (Signed)
° ° ° ° ° °  Pt request refill of the following: ° °HYDROcodone-acetaminophen (NORCO/VICODIN) 5-325 MG tablet ° ° °Phamacy: °

## 2015-06-20 NOTE — Telephone Encounter (Signed)
Pt is not do until the Monday

## 2015-06-23 MED ORDER — HYDROCODONE-ACETAMINOPHEN 5-325 MG PO TABS: ORAL_TABLET | ORAL | Status: DC

## 2015-06-23 NOTE — Telephone Encounter (Signed)
Pt notified Rx ready for pickup. Rx printed and signed.  

## 2015-07-02 DIAGNOSIS — M4806 Spinal stenosis, lumbar region: Secondary | ICD-10-CM | POA: Diagnosis not present

## 2015-07-04 ENCOUNTER — Telehealth: Payer: Self-pay | Admitting: Internal Medicine

## 2015-07-04 DIAGNOSIS — H353123 Nonexudative age-related macular degeneration, left eye, advanced atrophic without subfoveal involvement: Secondary | ICD-10-CM | POA: Diagnosis not present

## 2015-07-04 DIAGNOSIS — H35423 Microcystoid degeneration of retina, bilateral: Secondary | ICD-10-CM | POA: Diagnosis not present

## 2015-07-04 DIAGNOSIS — H35433 Paving stone degeneration of retina, bilateral: Secondary | ICD-10-CM | POA: Diagnosis not present

## 2015-07-04 DIAGNOSIS — H353211 Exudative age-related macular degeneration, right eye, with active choroidal neovascularization: Secondary | ICD-10-CM | POA: Diagnosis not present

## 2015-07-04 MED ORDER — ALPRAZOLAM 0.5 MG PO TABS: 0.5000 mg | ORAL_TABLET | Freq: Two times a day (BID) | ORAL | Status: DC

## 2015-07-04 NOTE — Telephone Encounter (Signed)
Rx faxed to pharmacy  

## 2015-07-04 NOTE — Telephone Encounter (Signed)
Pt request refill  ALPRAZolam (XANAX) 0.5 MG tablet  [facility]   Pt has appointment next tues, 5/16 to discuss meds.

## 2015-07-07 ENCOUNTER — Other Ambulatory Visit: Payer: Self-pay | Admitting: Internal Medicine

## 2015-07-08 ENCOUNTER — Ambulatory Visit (INDEPENDENT_AMBULATORY_CARE_PROVIDER_SITE_OTHER): Payer: Medicare Other | Admitting: Internal Medicine

## 2015-07-08 ENCOUNTER — Encounter: Payer: Self-pay | Admitting: Internal Medicine

## 2015-07-08 VITALS — BP 120/70 | HR 79 | Temp 98.3°F | Resp 20 | Ht 62.0 in | Wt 195.0 lb

## 2015-07-08 DIAGNOSIS — I10 Essential (primary) hypertension: Secondary | ICD-10-CM

## 2015-07-08 DIAGNOSIS — E039 Hypothyroidism, unspecified: Secondary | ICD-10-CM | POA: Diagnosis not present

## 2015-07-08 DIAGNOSIS — E119 Type 2 diabetes mellitus without complications: Secondary | ICD-10-CM

## 2015-07-08 LAB — HEMOGLOBIN A1C: Hgb A1c MFr Bld: 7.3 % — ABNORMAL HIGH (ref 4.6–6.5)

## 2015-07-08 MED ORDER — ALPRAZOLAM 0.5 MG PO TABS: 0.5000 mg | ORAL_TABLET | Freq: Three times a day (TID) | ORAL | Status: DC

## 2015-07-08 NOTE — Progress Notes (Signed)
Subjective:    Patient ID: Katie Booker, female    DOB: 1945-11-15, 70 y.o.   MRN: [id number]  HPI  Lab Results  Component Value Date   HGBA1C 6.5* 03/12/2015    70 year old patient who is seen today for follow-up.  She has history of spinal stenosis and is status post a lumbar fusion earlier in the year.  She has been released from neurosurgical follow-up for 9 months but still having low back pain. She has a history of anxiety disorder and since her discharge has been much more anxious with insomnia and more frequent panic attacks.  She had been on a regimen of alprazolam twice daily, but now needs this medication 3 times daily.  She has been out for the past month and has done very poorly.  She has been using her daughter's lorazepam periodically.  Her diabetes has been well controlled.  Last hemoglobin A1c 6.5 Calcium has been recommended in the past, but she has not pursued this, but is agreeable today.  She now is on Requip by neurosurgery for RLS  Past Medical History  Diagnosis Date  . HYPERLIPIDEMIA 09/18/2009    takes Lipitor daily  . HYPOTHYROIDISM 09/18/2009  . OSTEOARTHRITIS 09/18/2009  . Obesity   . Hemorrhoids   . Anal fissure   . Anxiety     takes Xanax prn sleep and anxiety  . DEPRESSION 09/18/2009    takes Celexa daily  . HYPERTENSION 09/18/2009    takes Atenolol daily and Lisinopril  . Histoplasmosis     hx of  . Shortness of breath     with exertion  . OSA (obstructive sleep apnea)     has a CPAP and sleep study report in epic from 2009  . History of bronchitis     last time around 1990  . Headache(784.0)     sinus HA  . Peripheral neuropathy (HCC)     takes Lyrica daily  . Joint pain   . Joint swelling   . Chronic back pain     scoliosis/stenosis/spondylosis  . Skin spots, red     right above both elbows;pt states always there  . GERD 09/18/2009    takes Omeprazole daily  . Umbilical hernia   . ANEMIA-NOS 09/18/2009    takes Ferrous Sulfate  daily  . History of colon polyps   . Urinary frequency   . Urinary urgency   . Insomnia     takes trazodone and ambien nightly  . Cancer (HCC)     basal cell left cheek  . DIABETES MELLITUS, TYPE II 09/18/2009    takes Amaryl daily and Metformin  . Spondylolisthesis at L1-L2 level   . Morbid obesity (HCC)      Social History   Social History  . Marital Status: Single    Spouse Name: N/A  . Number of Children: N/A  . Years of Education: N/A   Occupational History  . retired    Social History Main Topics  . Smoking status: Former Smoker -- 1.00 packs/day for 20 years    Types: Cigarettes    Quit date: 02/23/2004  . Smokeless tobacco: Never Used     Comment: quit 2007  . Alcohol Use: 0.0 oz/week    0 Standard drinks or equivalent per week     Comment: rare  . Drug Use: No  . Sexual Activity: No   Other Topics Concern  . Not on file   Social History Narrative    Past  Surgical History  Procedure Laterality Date  . Tubal fulgaration  1991  . Colonoscopy  2007  . Esophagogastroduodenoscopy  2007  . Hemorrhoid surgery  03/23/2011    Procedure: HEMORRHOIDECTOMY;  Surgeon: Clovis Pu. Cornett, MD;  Location: [facility];  Service: General;  Laterality: N/A;  lateral internal sphincterotomy and hemorrhoidectomy  . Sphincterotomy    . Thoracic discectomy N/A 12/15/2012    Procedure: Thoracic ten-eleven Thoracic laminectomy;  Surgeon: Barnett Abu, MD;  Location: [facility];  Service: Neurosurgery;  Laterality: N/A;  Thoracic ten-eleven Thoracic laminectomy  . Back surgery    . Anterior cervical decomp/discectomy fusion N/A 09/02/2014    Procedure: Cervical five- six  Anterior cervical decompression fusion;  Surgeon: Barnett Abu, MD;  Location: [facility];  Service: Neurosurgery;  Laterality: N/A;  C5-6 Anterior cervical decompression/diskectomy/fusion  . Carpal tunnel release      RIGHT      2016     Family History  Problem Relation Age of Onset  .  Diabetes Father   . COPD Father   . Cancer Maternal Grandfather     stomach    Allergies  Allergen Reactions  . Duraprep [Tersaseptic] Itching and Rash    Irritation everywhere prep was used  . Hydromorphone Hcl     Makes crazy  . Morphine And Related Other (See Comments)    hallucinations    Current Outpatient Prescriptions on File Prior to Visit  Medication Sig Dispense Refill  . ALPRAZolam (XANAX) 0.5 MG tablet Take 1 tablet (0.5 mg total) by mouth 2 (two) times daily. 180 tablet 1  . atenolol (TENORMIN) 25 MG tablet Take 1 tablet by mouth  daily 90 tablet 0  . atorvastatin (LIPITOR) 40 MG tablet Take 1 tablet by mouth once daily 90 tablet 1  . Calcium Carb-Cholecalciferol (CALCIUM 600/VITAMIN D3 PO) Take 1 tablet by mouth 2 (two) times daily.    . Carboxymethylcellulose Sodium (LUBRICANT EYE DROPS OP) Apply 2 drops to eye 4 (four) times daily as needed.    . citalopram (CELEXA) 40 MG tablet Take 1 tablet by mouth  daily 90 tablet 1  . docusate sodium (COLACE) 100 MG capsule Take 100 mg by mouth 2 (two) times daily.    Marland Kitchen estradiol (ESTRACE) 0.1 MG/GM vaginal cream Place 1 Applicatorful vaginally 3 (three) times a week. 42.5 g 12  . gemfibrozil (LOPID) 600 MG tablet TAKE ONE TABLET BY MOUTH  TWICE DAILY BEFORE MEALS 180 tablet 0  . glimepiride (AMARYL) 2 MG tablet Take one-half tablet by  mouth daily 45 tablet 5  . glucose blood (ONE TOUCH ULTRA TEST) test strip 1 each by Other route daily. Use as instructed 100 each 4  . HYDROcodone-acetaminophen (NORCO/VICODIN) 5-325 MG tablet 1 by mouth every 6 hours as needed for pain **DO NOT EXCEED 4 GM OF TYLENOL IN 24 HOURS** 120 tablet 0  . hydrocortisone 2.5 % cream Apply 1 application topically 2 (two) times daily.    . Lancets (ONETOUCH ULTRASOFT) lancets Use daily 100 each 6  . levothyroxine (SYNTHROID, LEVOTHROID) 150 MCG tablet Take 1 tablet by mouth once daily 90 tablet 2  . lisinopril (PRINIVIL,ZESTRIL) 10 MG tablet Take 1 tablet  by mouth once daily 90 tablet 1  . loratadine (CLARITIN) 10 MG tablet Take 10 mg by mouth daily as needed for allergies.    . meloxicam (MOBIC) 15 MG tablet Take 1 tablet by mouth  daily 90 tablet 1  . metFORMIN (GLUCOPHAGE) 1000 MG  tablet TAKE 1 TABLET BY MOUTH  TWICE DAILY WITH MEAL 180 tablet 0  . methocarbamol (ROBAXIN) 500 MG tablet Take 500 mg by mouth every 6 (six) hours as needed for muscle spasms.    . Multiple Vitamin (MULTIVITAMIN) tablet Take 1 tablet by mouth daily.    . Omega-3 Fatty Acids (FISH OIL) 1200 MG CAPS Take 2 capsules by mouth 2 (two) times daily.    Marland Kitchen omeprazole (PRILOSEC) 20 MG capsule Take 20 mg by mouth 2 (two) times daily.    . polyethylene glycol (MIRALAX / GLYCOLAX) packet Take 17 g by mouth daily. For 3 days, then daily as needed.  Stop 03/28/15.    . pregabalin (LYRICA) 75 MG capsule Take 75 mg by mouth daily.    Marland Kitchen tobramycin (TOBREX) 0.3 % ophthalmic ointment Place 1 application into the right eye. Apply the day before, the day of, and the day after eye injections.    . traZODone (DESYREL) 100 MG tablet Take 200 mg by mouth at bedtime.     No current facility-administered medications on file prior to visit.    BP 120/70 mmHg  Pulse 79  Temp(Src) 98.3 F (36.8 C) (Oral)  Resp 20  Ht 5\' 2"  (1.575 m)  Wt 195 lb (88.451 kg)  BMI 35.66 kg/m2  SpO2 97%     Review of Systems  Constitutional: Negative.   HENT: Negative for congestion, dental problem, hearing loss, rhinorrhea, sinus pressure, sore throat and tinnitus.   Eyes: Negative for pain, discharge and visual disturbance.  Respiratory: Negative for cough and shortness of breath.   Cardiovascular: Negative for chest pain, palpitations and leg swelling.  Gastrointestinal: Negative for nausea, vomiting, abdominal pain, diarrhea, constipation, blood in stool and abdominal distention.  Genitourinary: Negative for dysuria, urgency, frequency, hematuria, flank pain, vaginal bleeding, vaginal discharge,  difficulty urinating, vaginal pain and pelvic pain.  Musculoskeletal: Negative for joint swelling, arthralgias and gait problem.  Skin: Negative for rash.  Neurological: Negative for dizziness, syncope, speech difficulty, weakness, numbness and headaches.  Hematological: Negative for adenopathy.  Psychiatric/Behavioral: Positive for sleep disturbance. Negative for behavioral problems, dysphoric mood and agitation. The patient is nervous/anxious.        Objective:   Physical Exam  Constitutional: She is oriented to person, place, and time. She appears well-developed and well-nourished.  Blood pressure low normal Slightly anxious  HENT:  Head: Normocephalic.  Right Ear: External ear normal.  Left Ear: External ear normal.  Mouth/Throat: Oropharynx is clear and moist.  Eyes: Conjunctivae and EOM are normal. Pupils are equal, round, and reactive to light.  Neck: Normal range of motion. Neck supple. No thyromegaly present.  Cardiovascular: Normal rate, regular rhythm, normal heart sounds and intact distal pulses.   Pulmonary/Chest: Effort normal and breath sounds normal.  Abdominal: Soft. Bowel sounds are normal. She exhibits no mass. There is no tenderness.  Musculoskeletal: Normal range of motion.  Lymphadenopathy:    She has no cervical adenopathy.  Neurological: She is alert and oriented to person, place, and time.  Skin: Skin is warm and dry. No rash noted.  Psychiatric: She has a normal mood and affect. Her behavior is normal.          Assessment & Plan:   Diabetes mellitus.  Will check a hemoglobin A1c Essential hypertension, stable Status post lumbar fusion for spinal stenosis Chronic low back pain Osteoarthritis  Medications updated.  Will increase alprazolam to 3 times a day regimen Patient agreeable for behavioral health referral.  Information dispensed Recheck 3 months

## 2015-07-08 NOTE — Patient Instructions (Signed)
Limit your sodium (Salt) intake   Please check your hemoglobin A1c every 3 months    It is important that you exercise regularly, at least 20 minutes 3 to 4 times per week.  If you develop chest pain or shortness of breath seek  medical attention.  You need to lose weight.  Consider a lower calorie diet and regular exercise.  Behavioral health follow-up as discussed

## 2015-07-08 NOTE — Progress Notes (Signed)
Pre visit review using our clinic review tool, if applicable. No additional management support is needed unless otherwise documented below in the visit note. 

## 2015-07-22 ENCOUNTER — Telehealth: Payer: Self-pay | Admitting: Internal Medicine

## 2015-07-22 NOTE — Telephone Encounter (Signed)
Pt need new Rx for Hydrocodone °

## 2015-07-23 ENCOUNTER — Other Ambulatory Visit: Payer: Self-pay | Admitting: Internal Medicine

## 2015-07-23 MED ORDER — HYDROCODONE-ACETAMINOPHEN 5-325 MG PO TABS: ORAL_TABLET | ORAL | Status: DC

## 2015-07-23 NOTE — Telephone Encounter (Signed)
Left message on voicemail Rx ready for pickup, will be at the front desk. Rx printed and signed. 

## 2015-08-01 DIAGNOSIS — H353123 Nonexudative age-related macular degeneration, left eye, advanced atrophic without subfoveal involvement: Secondary | ICD-10-CM | POA: Diagnosis not present

## 2015-08-01 DIAGNOSIS — H35433 Paving stone degeneration of retina, bilateral: Secondary | ICD-10-CM | POA: Diagnosis not present

## 2015-08-01 DIAGNOSIS — H353211 Exudative age-related macular degeneration, right eye, with active choroidal neovascularization: Secondary | ICD-10-CM | POA: Diagnosis not present

## 2015-08-01 DIAGNOSIS — H35423 Microcystoid degeneration of retina, bilateral: Secondary | ICD-10-CM | POA: Diagnosis not present

## 2015-08-08 ENCOUNTER — Other Ambulatory Visit: Payer: Self-pay | Admitting: Internal Medicine

## 2015-08-21 ENCOUNTER — Telehealth: Payer: Self-pay | Admitting: Internal Medicine

## 2015-08-21 MED ORDER — HYDROCODONE-ACETAMINOPHEN 5-325 MG PO TABS: ORAL_TABLET | ORAL | Status: DC

## 2015-08-21 NOTE — Telephone Encounter (Signed)
Pt request refill  °HYDROcodone-acetaminophen (NORCO/VICODIN) 5-325 MG tablet °

## 2015-08-21 NOTE — Telephone Encounter (Signed)
Pt notified Rx ready for pickup tomorrow, will be at the front desk. Rx printed and signed.

## 2015-09-05 ENCOUNTER — Other Ambulatory Visit: Payer: Self-pay | Admitting: Internal Medicine

## 2015-09-09 DIAGNOSIS — H35423 Microcystoid degeneration of retina, bilateral: Secondary | ICD-10-CM | POA: Diagnosis not present

## 2015-09-09 DIAGNOSIS — H35433 Paving stone degeneration of retina, bilateral: Secondary | ICD-10-CM | POA: Diagnosis not present

## 2015-09-09 DIAGNOSIS — H353211 Exudative age-related macular degeneration, right eye, with active choroidal neovascularization: Secondary | ICD-10-CM | POA: Diagnosis not present

## 2015-09-09 DIAGNOSIS — H353123 Nonexudative age-related macular degeneration, left eye, advanced atrophic without subfoveal involvement: Secondary | ICD-10-CM | POA: Diagnosis not present

## 2015-09-14 ENCOUNTER — Other Ambulatory Visit: Payer: Self-pay | Admitting: Internal Medicine

## 2015-09-15 NOTE — Telephone Encounter (Signed)
Rx refill sent to pharmacy. 

## 2015-09-19 ENCOUNTER — Telehealth: Payer: Self-pay | Admitting: Internal Medicine

## 2015-09-19 MED ORDER — HYDROCODONE-ACETAMINOPHEN 5-325 MG PO TABS: ORAL_TABLET | ORAL | 0 refills | Status: DC

## 2015-09-19 NOTE — Telephone Encounter (Signed)
Okay to refill? 

## 2015-09-19 NOTE — Telephone Encounter (Signed)
Pt need new Rx for Hydrocodone °

## 2015-09-19 NOTE — Telephone Encounter (Signed)
Rx printed for signature. 

## 2015-09-19 NOTE — Telephone Encounter (Signed)
Pt is aware rx is ready, rx placed up front for pick up.

## 2015-09-25 ENCOUNTER — Other Ambulatory Visit: Payer: Self-pay

## 2015-09-25 MED ORDER — PREGABALIN 75 MG PO CAPS: 75.0000 mg | ORAL_CAPSULE | Freq: Every day | ORAL | 1 refills | Status: DC

## 2015-10-08 ENCOUNTER — Ambulatory Visit (INDEPENDENT_AMBULATORY_CARE_PROVIDER_SITE_OTHER): Payer: Medicare Other | Admitting: Internal Medicine

## 2015-10-08 ENCOUNTER — Encounter: Payer: Self-pay | Admitting: Internal Medicine

## 2015-10-08 ENCOUNTER — Other Ambulatory Visit: Payer: Self-pay | Admitting: Internal Medicine

## 2015-10-08 VITALS — BP 128/74 | HR 82 | Temp 98.2°F | Ht 62.0 in | Wt 199.0 lb

## 2015-10-08 DIAGNOSIS — N3281 Overactive bladder: Secondary | ICD-10-CM

## 2015-10-08 DIAGNOSIS — E785 Hyperlipidemia, unspecified: Secondary | ICD-10-CM

## 2015-10-08 DIAGNOSIS — M549 Dorsalgia, unspecified: Secondary | ICD-10-CM | POA: Diagnosis not present

## 2015-10-08 DIAGNOSIS — I10 Essential (primary) hypertension: Secondary | ICD-10-CM

## 2015-10-08 DIAGNOSIS — G8929 Other chronic pain: Secondary | ICD-10-CM

## 2015-10-08 DIAGNOSIS — E119 Type 2 diabetes mellitus without complications: Secondary | ICD-10-CM | POA: Diagnosis not present

## 2015-10-08 DIAGNOSIS — M25562 Pain in left knee: Secondary | ICD-10-CM

## 2015-10-08 LAB — HEMOGLOBIN A1C: HEMOGLOBIN A1C: 6.8 % — AB (ref 4.6–6.5)

## 2015-10-08 MED ORDER — MIRABEGRON ER 25 MG PO TB24: 25.0000 mg | ORAL_TABLET | Freq: Every day | ORAL | Status: DC

## 2015-10-08 MED ORDER — PIOGLITAZONE HCL 15 MG PO TABS: 15.0000 mg | ORAL_TABLET | Freq: Every day | ORAL | Status: DC

## 2015-10-08 NOTE — Progress Notes (Signed)
Subjective:    Patient ID: Katie Booker, female    DOB: 1945-04-24, 70 y.o.   MRN: [id number]  HPI  [age] year old patient who is seen today for follow-up of diabetes.  Her last hemoglobin A1c was not well controlled but she had been taking glimepiride only sporadically.  She has been much more faithful with this medication but she continues to have some mild hypoglycemia.  She complains of polyuria that affects sleep at night  Complaining of worsening restless leg syndrome  No change in chronic low back pain  Complaining of worsening left knee pain and requesting orthopedic referral  Past Medical History:  Diagnosis Date  . Anal fissure   . ANEMIA-NOS 09/18/2009   takes Ferrous Sulfate daily  . Anxiety    takes Xanax prn sleep and anxiety  . Cancer (HCC)    basal cell left cheek  . Chronic back pain    scoliosis/stenosis/spondylosis  . DEPRESSION 09/18/2009   takes Celexa daily  . DIABETES MELLITUS, TYPE II 09/18/2009   takes Amaryl daily and Metformin  . GERD 09/18/2009   takes Omeprazole daily  . Headache(784.0)    sinus HA  . Hemorrhoids   . Histoplasmosis    hx of  . History of bronchitis    last time around 1990  . History of colon polyps   . HYPERLIPIDEMIA 09/18/2009   takes Lipitor daily  . HYPERTENSION 09/18/2009   takes Atenolol daily and Lisinopril  . HYPOTHYROIDISM 09/18/2009  . Insomnia    takes trazodone and ambien nightly  . Joint pain   . Joint swelling   . Morbid obesity (HCC)   . Obesity   . OSA (obstructive sleep apnea)    has a CPAP and sleep study report in epic from 2009  . OSTEOARTHRITIS 09/18/2009  . Peripheral neuropathy (HCC)    takes Lyrica daily  . Shortness of breath    with exertion  . Skin spots, red    right above both elbows;pt states always there  . Spondylolisthesis at L1-L2 level   . Umbilical hernia   . Urinary frequency   . Urinary urgency      Social History   Social History  . Marital status: Single    Spouse  name: N/A  . Number of children: N/A  . Years of education: N/A   Occupational History  . retired    Social History Main Topics  . Smoking status: Former Smoker    Packs/day: 1.00    Years: 20.00    Types: Cigarettes    Quit date: 02/23/2004  . Smokeless tobacco: Never Used     Comment: quit 2007  . Alcohol use 0.0 oz/week     Comment: rare  . Drug use: No  . Sexual activity: No   Other Topics Concern  . Not on file   Social History Narrative  . No narrative on file    Past Surgical History:  Procedure Laterality Date  . ANTERIOR CERVICAL DECOMP/DISCECTOMY FUSION N/A 09/02/2014   Procedure: Cervical five- six  Anterior cervical decompression fusion;  Surgeon: Barnett AbuHenry Elsner, MD;  Location: [facility];  Service: Neurosurgery;  Laterality: N/A;  C5-6 Anterior cervical decompression/diskectomy/fusion  . BACK SURGERY    . CARPAL TUNNEL RELEASE     RIGHT      2016   . COLONOSCOPY  2007  . ESOPHAGOGASTRODUODENOSCOPY  2007  . HEMORRHOID SURGERY  03/23/2011   Procedure: HEMORRHOIDECTOMY;  Surgeon: Clovis Puhomas A. Cornett, MD;  Location:  [facility];  Service: General;  Laterality: N/A;  lateral internal sphincterotomy and hemorrhoidectomy  . SPHINCTEROTOMY    . THORACIC DISCECTOMY N/A 12/15/2012   Procedure: Thoracic ten-eleven Thoracic laminectomy;  Surgeon: Barnett AbuHenry Elsner, MD;  Location: [facility];  Service: Neurosurgery;  Laterality: N/A;  Thoracic ten-eleven Thoracic laminectomy  . tubal fulgaration  1991    Family History  Problem Relation Age of Onset  . Diabetes Father   . COPD Father   . Cancer Maternal Grandfather     stomach    Allergies  Allergen Reactions  . Duraprep [Tersaseptic] Itching and Rash    Irritation everywhere prep was used  . Hydromorphone Hcl     Makes crazy  . Morphine And Related Other (See Comments)    hallucinations    Current Outpatient Prescriptions on File Prior to Visit  Medication Sig Dispense Refill  . ALPRAZolam (XANAX)  0.5 MG tablet Take 1 tablet (0.5 mg total) by mouth 3 (three) times daily. 90 tablet 2  . atenolol (TENORMIN) 25 MG tablet Take 1 tablet by mouth  daily 90 tablet 1  . atorvastatin (LIPITOR) 40 MG tablet Take 1 tablet by mouth once daily 90 tablet 1  . Calcium Carb-Cholecalciferol (CALCIUM 600/VITAMIN D3 PO) Take 1 tablet by mouth 2 (two) times daily.    . Carboxymethylcellulose Sodium (LUBRICANT EYE DROPS OP) Apply 2 drops to eye 4 (four) times daily as needed.    . citalopram (CELEXA) 40 MG tablet Take 1 tablet by mouth  daily 90 tablet 0  . docusate sodium (COLACE) 100 MG capsule Take 100 mg by mouth 2 (two) times daily.    Marland Kitchen. estradiol (ESTRACE) 0.1 MG/GM vaginal cream Place 1 Applicatorful vaginally 3 (three) times a week. 42.5 g 12  . gemfibrozil (LOPID) 600 MG tablet TAKE ONE TABLET BY MOUTH  TWICE DAILY BEFORE MEALS 180 tablet 1  . glimepiride (AMARYL) 2 MG tablet Take one-half tablet by  mouth daily 45 tablet 5  . glucose blood (ONE TOUCH ULTRA TEST) test strip 1 each by Other route daily. Use as instructed 100 each 4  . HYDROcodone-acetaminophen (NORCO/VICODIN) 5-325 MG tablet 1 by mouth every 6 hours as needed for pain **DO NOT EXCEED 4 GM OF TYLENOL IN 24 HOURS** 120 tablet 0  . hydrocortisone 2.5 % cream Apply 1 application topically 2 (two) times daily.    . Lancets (ONETOUCH ULTRASOFT) lancets Use daily 100 each 6  . levothyroxine (SYNTHROID, LEVOTHROID) 150 MCG tablet Take 1 tablet by mouth once daily 90 tablet 0  . lisinopril (PRINIVIL,ZESTRIL) 10 MG tablet Take 1 tablet by mouth once daily 90 tablet 1  . loratadine (CLARITIN) 10 MG tablet Take 10 mg by mouth daily as needed for allergies.    . meloxicam (MOBIC) 15 MG tablet Take 1 tablet by mouth  daily 90 tablet 1  . metFORMIN (GLUCOPHAGE) 1000 MG tablet TAKE 1 TABLET BY MOUTH  TWICE DAILY WITH MEAL 180 tablet 0  . methocarbamol (ROBAXIN) 500 MG tablet Take 500 mg by mouth every 6 (six) hours as needed for muscle spasms.    .  Multiple Vitamin (MULTIVITAMIN) tablet Take 1 tablet by mouth daily.    . Omega-3 Fatty Acids (FISH OIL) 1200 MG CAPS Take 2 capsules by mouth 2 (two) times daily.    Marland Kitchen. omeprazole (PRILOSEC) 20 MG capsule Take 1 capsule by mouth two times daily 180 capsule 3  . pregabalin (LYRICA) 75 MG capsule Take 1 capsule (75  mg total) by mouth daily. 90 capsule 1  . rOPINIRole (REQUIP) 1 MG tablet Take 1 mg by mouth at bedtime.     Marland Kitchen tobramycin (TOBREX) 0.3 % ophthalmic ointment Place 1 application into the right eye. Apply the day before, the day of, and the day after eye injections.    . traZODone (DESYREL) 100 MG tablet Take 2 tablets by mouth at  bedtime 180 tablet 1   No current facility-administered medications on file prior to visit.     BP 128/74 (BP Location: Left Arm, Patient Position: Sitting, Cuff Size: Normal)   Pulse 82   Temp 98.2 F (36.8 C) (Oral)   Ht 5\' 2"  (1.575 m)   Wt 199 lb (90.3 kg)   SpO2 97%   BMI 36.40 kg/m     Review of Systems  Constitutional: Negative.   HENT: Negative for congestion, dental problem, hearing loss, rhinorrhea, sinus pressure, sore throat and tinnitus.   Eyes: Negative for pain, discharge and visual disturbance.  Respiratory: Negative for cough and shortness of breath.   Cardiovascular: Negative for chest pain, palpitations and leg swelling.  Gastrointestinal: Negative for abdominal distention, abdominal pain, blood in stool, constipation, diarrhea, nausea and vomiting.  Endocrine: Positive for polyuria.  Genitourinary: Negative for difficulty urinating, dysuria, flank pain, frequency, hematuria, pelvic pain, urgency, vaginal bleeding, vaginal discharge and vaginal pain.  Musculoskeletal: Positive for arthralgias, back pain and gait problem. Negative for joint swelling.  Skin: Negative for rash.  Neurological: Negative for dizziness, syncope, speech difficulty, weakness, numbness and headaches.  Hematological: Negative for adenopathy.    Psychiatric/Behavioral: Positive for sleep disturbance. Negative for agitation, behavioral problems and dysphoric mood. The patient is not nervous/anxious.        Objective:   Physical Exam  Constitutional: She is oriented to person, place, and time. She appears well-developed and well-nourished.  Overweight Blood pressure well controlled  HENT:  Head: Normocephalic.  Right Ear: External ear normal.  Left Ear: External ear normal.  Mouth/Throat: Oropharynx is clear and moist.  Eyes: Conjunctivae and EOM are normal. Pupils are equal, round, and reactive to light.  Neck: Normal range of motion. Neck supple. No thyromegaly present.  Cardiovascular: Normal rate, regular rhythm, normal heart sounds and intact distal pulses.   Pulmonary/Chest: Effort normal and breath sounds normal.  Abdominal: Soft. Bowel sounds are normal. She exhibits no mass. There is no tenderness.  Musculoskeletal: Normal range of motion.  Lymphadenopathy:    She has no cervical adenopathy.  Neurological: She is alert and oriented to person, place, and time.  Skin: Skin is warm and dry. No rash noted.  Psychiatric: She has a normal mood and affect. Her behavior is normal.          Assessment & Plan:   diabetes mellitus complicated by occasional hypoglycemia   Discontinue low-dose glimepiride and substitute Actos 15 .  Left knee pain.  Set up for orthopedic evaluation  chronic low back pain .  Overactive bladder.  Will give a trial of  Medication   recheck 3 months .  Weight loss encouraged  .  Orthopedic referral  Rogelia Boga, MD

## 2015-10-08 NOTE — Patient Instructions (Addendum)
Discontinue glimepiride   Please check your hemoglobin A1c every 3 months  Return in 3 months for follow-up   Overactive Bladder, Adult Overactive bladder is a group of urinary symptoms. With overactive bladder, you may suddenly feel the need to pass urine (urinate) right away. After feeling this sudden urge, you might also leak urine if you cannot get to the bathroom fast enough (urinary incontinence). These symptoms might interfere with your daily work or social activities. Overactive bladder symptoms may also wake you up at night. Overactive bladder affects the nerve signals between your bladder and your brain. Your bladder may get the signal to empty before it is full. Very sensitive muscles can also make your bladder squeeze too soon. CAUSES Many things can cause an overactive bladder. Possible causes include:  Urinary tract infection.  Infection of nearby tissues, such as the prostate.  Prostate enlargement.  Being pregnant with twins or more (multiples).  Surgery on the uterus or urethra.  Bladder stones, inflammation, or tumors.  Drinking too much caffeine or alcohol.  Certain medicines, especially those that you take to help your body get rid of extra fluid (diuretics) by increasing urine production.  Muscle or nerve weakness, especially from:  A spinal cord injury.  Stroke.  Multiple sclerosis.  Parkinson disease.  Diabetes. This can cause a high urine volume that fills the bladder so quickly that the normal urge to urinate is triggered very strongly.  Constipation. A buildup of too much stool can put pressure on your bladder. RISK FACTORS You may be at greater risk for overactive bladder if you:  Are an older adult.  Smoke.  Are going through menopause.  Have prostate problems.  Have a neurological disease, such as stroke, dementia, Parkinson disease, or multiple sclerosis (MS).  Eat or drink things that irritate the bladder. These include alcohol,  spicy food, and caffeine.  Are overweight or obese. SIGNS AND SYMPTOMS  The signs and symptoms of an overactive bladder include:  Sudden, strong urges to urinate.  Leaking urine.  Urinating eight or more times per day.  Waking up to urinate two or more times per night. DIAGNOSIS Your health care provider may suspect overactive bladder based on your symptoms. The health care provider will do a physical exam and take your medical history. Blood or urine tests may also be done. For example, you might need to have a bladder function test to check how well you can hold your urine. You might also need to see a health care provider who specializes in the urinary tract (urologist). TREATMENT Treatment for overactive bladder depends on the cause of your condition and whether it is mild or severe. Certain treatments can be done in your health care provider's office or clinic. You can also make lifestyle changes at home. Options include: Behavioral Treatments  Biofeedback. A specialist uses sensors to help you become aware of your body's signals.  Keeping a daily log of when you need to urinate and what happens after the urge. This may help you manage your condition.  Bladder training. This helps you learn to control the urge to urinate by following a schedule that directs you to urinate at regular intervals (timed voiding). At first, you might have to wait a few minutes after feeling the urge. In time, you should be able to schedule bathroom visits an hour or more apart.  Kegel exercises. These are exercises to strengthen the pelvic floor muscles, which support the bladder. Toning these muscles can help you  control urination, even if your bladder muscles are overactive. A specialist will teach you how to do these exercises correctly. They require daily practice.  Weight loss. If you are obese or overweight, losing weight might relieve your symptoms of overactive bladder. Talk to your health care  provider about losing weight and whether there is a specific program or method that would work best for you.  Diet change. This might help if constipation is making your overactive bladder worse. Your health care provider or a dietitian can explain ways to change what you eat to ease constipation. You might also need to consume less alcohol and caffeine or drink other fluids at different times of the day.  Stopping smoking.  Wearing pads to absorb leakage while you wait for other treatments to take effect. Physical Treatments  Electrical stimulation. Electrodes send gentle pulses of electricity to strengthen the nerves or muscles that help to control the bladder. Sometimes, the electrodes are placed outside of the body. In other cases, they might be placed inside the body (implanted). This treatment can take several months to have an effect.  Supportive devices. Women may need a plastic device that fits into the vagina and supports the bladder (pessary). Medicines Several medicines can help treat overactive bladder and are usually used along with other treatments. Some are injected into the muscles involved in urination. Others come in pill form. Your health care provider may prescribe:  Antispasmodics. These medicines block the signals that the nerves send to the bladder. This keeps the bladder from releasing urine at the wrong time.  Tricyclic antidepressants. These types of antidepressants also relax bladder muscles. Surgery  You may have a device implanted to help manage the nerve signals that indicate when you need to urinate.  You may have surgery to implant electrodes for electrical stimulation.  Sometimes, very severe cases of overactive bladder require surgery to change the shape of the bladder. HOME CARE INSTRUCTIONS   Take medicines only as directed by your health care provider.  Use any implants or a pessary as directed by your health care provider.  Make any diet or  lifestyle changes that are recommended by your health care provider. These might include:  Drinking less fluid or drinking at different times of the day. If you need to urinate often during the night, you may need to stop drinking fluids early in the evening.  Cutting down on caffeine or alcohol. Both can make an overactive bladder worse. Caffeine is found in coffee, tea, and sodas.  Doing Kegel exercises to strengthen muscles.  Losing weight if you need to.  Eating a healthy and balanced diet to prevent constipation.  Keep a journal or log to track how much and when you drink and also when you feel the need to urinate. This will help your health care provider to monitor your condition. SEEK MEDICAL CARE IF:  Your symptoms do not get better after treatment.  Your pain and discomfort are getting worse.  You have more frequent urges to urinate.  You have a fever. SEEK IMMEDIATE MEDICAL CARE IF: You are not able to control your bladder at all.   This information is not intended to replace advice given to you by your health care provider. Make sure you discuss any questions you have with your health care provider.   Document Released: 12/05/2008 Document Revised: 03/01/2014 Document Reviewed: 07/04/2013 Elsevier Interactive Patient Education [employer]2016 [employer] Inc.

## 2015-10-08 NOTE — Progress Notes (Signed)
Pre visit review using our clinic review tool, if applicable. No additional management support is needed unless otherwise documented below in the visit note. 

## 2015-10-08 NOTE — Telephone Encounter (Signed)
Pt states she is to start  pioglitazone (ACTOS) tablet 15 mg mirabegron ER (MYRBETRIQ) tablet 25 mg  But it is under "clinic administered." Pt did not get anything.  [facility] [employer]

## 2015-10-09 ENCOUNTER — Other Ambulatory Visit: Payer: Self-pay | Admitting: *Deleted

## 2015-10-09 MED ORDER — MIRABEGRON ER 25 MG PO TB24: 25.0000 mg | ORAL_TABLET | Freq: Every day | ORAL | 2 refills | Status: DC

## 2015-10-09 MED ORDER — PIOGLITAZONE HCL 15 MG PO TABS: 15.0000 mg | ORAL_TABLET | Freq: Every day | ORAL | 2 refills | Status: DC

## 2015-10-09 NOTE — Telephone Encounter (Signed)
Noted in visit on 10/08/15 provider did request patient to be prescribed these medications and return in 3 months. [facility] and [facility] [employer] [facility].

## 2015-10-10 DIAGNOSIS — M170 Bilateral primary osteoarthritis of knee: Secondary | ICD-10-CM | POA: Diagnosis not present

## 2015-10-17 DIAGNOSIS — H353211 Exudative age-related macular degeneration, right eye, with active choroidal neovascularization: Secondary | ICD-10-CM | POA: Diagnosis not present

## 2015-10-17 DIAGNOSIS — H35423 Microcystoid degeneration of retina, bilateral: Secondary | ICD-10-CM | POA: Diagnosis not present

## 2015-10-17 DIAGNOSIS — H43813 Vitreous degeneration, bilateral: Secondary | ICD-10-CM | POA: Diagnosis not present

## 2015-10-17 DIAGNOSIS — H353123 Nonexudative age-related macular degeneration, left eye, advanced atrophic without subfoveal involvement: Secondary | ICD-10-CM | POA: Diagnosis not present

## 2015-10-20 ENCOUNTER — Telehealth: Payer: Self-pay | Admitting: Internal Medicine

## 2015-10-20 MED ORDER — HYDROCODONE-ACETAMINOPHEN 5-325 MG PO TABS: ORAL_TABLET | ORAL | 0 refills | Status: DC

## 2015-10-20 NOTE — Telephone Encounter (Signed)
Pt request refill  HYDROcodone-acetaminophen (NORCO/VICODIN) 5-325 MG tablet  Pt wants to add she is almost out and needs today. Advised pt of the 3 day policy.

## 2015-10-20 NOTE — Telephone Encounter (Signed)
Left message on voicemail Rx ready for pickup will be at the front desk. Rx printed and signed.  

## 2015-10-21 ENCOUNTER — Other Ambulatory Visit: Payer: Self-pay | Admitting: Internal Medicine

## 2015-10-21 NOTE — Telephone Encounter (Signed)
Rx refill sent to pharmacy. 

## 2015-11-06 ENCOUNTER — Telehealth: Payer: Self-pay | Admitting: Internal Medicine

## 2015-11-06 NOTE — Telephone Encounter (Signed)
Pt need refill on alprazolam 0.5 mg #270 . Pt takes med three times a day. [facility]

## 2015-11-07 MED ORDER — ALPRAZOLAM 0.5 MG PO TABS: 0.5000 mg | ORAL_TABLET | Freq: Three times a day (TID) | ORAL | 5 refills | Status: DC

## 2015-11-07 NOTE — Telephone Encounter (Signed)
Left message on personal voicemail Rx faxed to [facility] as requested any questions please call office.

## 2015-11-17 ENCOUNTER — Telehealth: Payer: Self-pay | Admitting: Internal Medicine

## 2015-11-17 NOTE — Telephone Encounter (Signed)
Pt request refill  °HYDROcodone-acetaminophen (NORCO/VICODIN) 5-325 MG tablet °

## 2015-11-19 MED ORDER — HYDROCODONE-ACETAMINOPHEN 5-325 MG PO TABS: ORAL_TABLET | ORAL | 0 refills | Status: DC

## 2015-11-19 NOTE — Telephone Encounter (Signed)
Pt notified Rx ready for pickup. Rx printed and signed.  

## 2015-11-24 ENCOUNTER — Other Ambulatory Visit: Payer: Self-pay | Admitting: *Deleted

## 2015-11-24 MED ORDER — PIOGLITAZONE HCL 15 MG PO TABS: 15.0000 mg | ORAL_TABLET | Freq: Every day | ORAL | 3 refills | Status: DC

## 2015-11-25 DIAGNOSIS — H35423 Microcystoid degeneration of retina, bilateral: Secondary | ICD-10-CM | POA: Diagnosis not present

## 2015-11-25 DIAGNOSIS — H43813 Vitreous degeneration, bilateral: Secondary | ICD-10-CM | POA: Diagnosis not present

## 2015-11-25 DIAGNOSIS — H353211 Exudative age-related macular degeneration, right eye, with active choroidal neovascularization: Secondary | ICD-10-CM | POA: Diagnosis not present

## 2015-11-25 DIAGNOSIS — H353123 Nonexudative age-related macular degeneration, left eye, advanced atrophic without subfoveal involvement: Secondary | ICD-10-CM | POA: Diagnosis not present

## 2015-12-02 ENCOUNTER — Other Ambulatory Visit: Payer: Self-pay | Admitting: *Deleted

## 2015-12-02 MED ORDER — PIOGLITAZONE HCL 15 MG PO TABS: 15.0000 mg | ORAL_TABLET | Freq: Every day | ORAL | 3 refills | Status: DC

## 2015-12-03 ENCOUNTER — Other Ambulatory Visit: Payer: Self-pay | Admitting: Internal Medicine

## 2015-12-09 ENCOUNTER — Other Ambulatory Visit: Payer: Self-pay | Admitting: Internal Medicine

## 2015-12-09 NOTE — Telephone Encounter (Signed)
Okay to refill? 

## 2015-12-09 NOTE — Telephone Encounter (Signed)
CITALOPRAM last filled on 09/15/15  #90 MELOXICAM 07/07/15 #90 +1  TRAZODONE 07/24/15  #180  Last OV 10/08/15, ok to refill?

## 2015-12-16 ENCOUNTER — Telehealth: Payer: Self-pay | Admitting: Internal Medicine

## 2015-12-16 NOTE — Telephone Encounter (Signed)
Pt request refill  °HYDROcodone-acetaminophen (NORCO/VICODIN) 5-325 MG tablet °

## 2015-12-17 MED ORDER — HYDROCODONE-ACETAMINOPHEN 5-325 MG PO TABS: ORAL_TABLET | ORAL | 0 refills | Status: DC

## 2015-12-17 NOTE — Telephone Encounter (Signed)
Pt notified Rx ready for pickup. Rx printed and signed.  

## 2015-12-30 DIAGNOSIS — H353211 Exudative age-related macular degeneration, right eye, with active choroidal neovascularization: Secondary | ICD-10-CM | POA: Diagnosis not present

## 2015-12-30 DIAGNOSIS — H43813 Vitreous degeneration, bilateral: Secondary | ICD-10-CM | POA: Diagnosis not present

## 2015-12-30 DIAGNOSIS — H353123 Nonexudative age-related macular degeneration, left eye, advanced atrophic without subfoveal involvement: Secondary | ICD-10-CM | POA: Diagnosis not present

## 2015-12-30 DIAGNOSIS — H35423 Microcystoid degeneration of retina, bilateral: Secondary | ICD-10-CM | POA: Diagnosis not present

## 2016-01-04 ENCOUNTER — Other Ambulatory Visit: Payer: Self-pay | Admitting: Internal Medicine

## 2016-01-05 ENCOUNTER — Telehealth: Payer: Self-pay | Admitting: Internal Medicine

## 2016-01-05 NOTE — Telephone Encounter (Signed)
Pt request refill  HYDROcodone-acetaminophen (NORCO/VICODIN) 5-325 MG tablet  Pt will need 11/24, but we are closed. So is requesting on 11/22  Pt has appt on 11/27 for a follow up.

## 2016-01-06 ENCOUNTER — Ambulatory Visit: Payer: Medicare Other | Admitting: Internal Medicine

## 2016-01-13 MED ORDER — HYDROCODONE-ACETAMINOPHEN 5-325 MG PO TABS: ORAL_TABLET | ORAL | 0 refills | Status: DC

## 2016-01-13 NOTE — Telephone Encounter (Signed)
Pt notified Rx ready for pickup. Rx printed and signed.  

## 2016-01-19 ENCOUNTER — Encounter: Payer: Self-pay | Admitting: Internal Medicine

## 2016-01-19 ENCOUNTER — Ambulatory Visit (INDEPENDENT_AMBULATORY_CARE_PROVIDER_SITE_OTHER): Payer: Medicare Other | Admitting: Internal Medicine

## 2016-01-19 VITALS — BP 130/62 | HR 78 | Temp 98.3°F | Resp 20 | Ht 62.0 in | Wt 203.2 lb

## 2016-01-19 DIAGNOSIS — E038 Other specified hypothyroidism: Secondary | ICD-10-CM

## 2016-01-19 DIAGNOSIS — E785 Hyperlipidemia, unspecified: Secondary | ICD-10-CM | POA: Diagnosis not present

## 2016-01-19 DIAGNOSIS — Z23 Encounter for immunization: Secondary | ICD-10-CM | POA: Diagnosis not present

## 2016-01-19 DIAGNOSIS — I10 Essential (primary) hypertension: Secondary | ICD-10-CM

## 2016-01-19 DIAGNOSIS — M8949 Other hypertrophic osteoarthropathy, multiple sites: Secondary | ICD-10-CM

## 2016-01-19 DIAGNOSIS — D649 Anemia, unspecified: Secondary | ICD-10-CM

## 2016-01-19 DIAGNOSIS — M150 Primary generalized (osteo)arthritis: Secondary | ICD-10-CM

## 2016-01-19 DIAGNOSIS — M159 Polyosteoarthritis, unspecified: Secondary | ICD-10-CM

## 2016-01-19 DIAGNOSIS — E119 Type 2 diabetes mellitus without complications: Secondary | ICD-10-CM

## 2016-01-19 LAB — CBC WITH DIFFERENTIAL/PLATELET
BASOS PCT: 0.5 % (ref 0.0–3.0)
Basophils Absolute: 0 10*3/uL (ref 0.0–0.1)
EOS PCT: 3.2 % (ref 0.0–5.0)
Eosinophils Absolute: 0.2 10*3/uL (ref 0.0–0.7)
HEMATOCRIT: 35.6 % — AB (ref 36.0–46.0)
HEMOGLOBIN: 11.6 g/dL — AB (ref 12.0–15.0)
LYMPHS PCT: 32.7 % (ref 12.0–46.0)
Lymphs Abs: 1.8 10*3/uL (ref 0.7–4.0)
MCHC: 32.7 g/dL (ref 30.0–36.0)
MCV: 72.6 fl — AB (ref 78.0–100.0)
Monocytes Absolute: 0.6 10*3/uL (ref 0.1–1.0)
Monocytes Relative: 10.1 % (ref 3.0–12.0)
Neutro Abs: 3 10*3/uL (ref 1.4–7.7)
Neutrophils Relative %: 53.5 % (ref 43.0–77.0)
Platelets: 218 10*3/uL (ref 150.0–400.0)
RBC: 4.9 Mil/uL (ref 3.87–5.11)
RDW: 21.4 % — AB (ref 11.5–15.5)
WBC: 5.6 10*3/uL (ref 4.0–10.5)

## 2016-01-19 LAB — HEMOGLOBIN A1C: HEMOGLOBIN A1C: 6.4 % (ref 4.6–6.5)

## 2016-01-19 LAB — LIPID PANEL
CHOLESTEROL: 171 mg/dL (ref 0–200)
HDL: 50.9 mg/dL (ref >39.00)
NonHDL: 119.75
Total CHOL/HDL Ratio: 3
Triglycerides: 304 mg/dL — ABNORMAL HIGH (ref 0.0–149.0)
VLDL: 60.8 mg/dL — ABNORMAL HIGH (ref 0.0–40.0)

## 2016-01-19 LAB — COMPREHENSIVE METABOLIC PANEL
ALBUMIN: 4.4 g/dL (ref 3.5–5.2)
ALT: 30 U/L (ref 0–35)
AST: 23 U/L (ref 0–37)
Alkaline Phosphatase: 58 U/L (ref 39–117)
BILIRUBIN TOTAL: 0.3 mg/dL (ref 0.2–1.2)
BUN: 14 mg/dL (ref 6–23)
CALCIUM: 9.4 mg/dL (ref 8.4–10.5)
CO2: 27 mEq/L (ref 19–32)
CREATININE: 0.86 mg/dL (ref 0.40–1.20)
Chloride: 99 mEq/L (ref 96–112)
GFR: 69.36 mL/min (ref >60.00)
Glucose, Bld: 96 mg/dL (ref 70–99)
Potassium: 4.2 mEq/L (ref 3.5–5.1)
SODIUM: 136 meq/L (ref 135–145)
Total Protein: 7.1 g/dL (ref 6.0–8.3)

## 2016-01-19 LAB — TSH: TSH: 5.37 u[IU]/mL — AB (ref 0.35–4.50)

## 2016-01-19 LAB — MICROALBUMIN / CREATININE URINE RATIO
Creatinine,U: 178.8 mg/dL
Microalb Creat Ratio: 0.7 mg/g (ref 0.0–30.0)
Microalb, Ur: 1.3 mg/dL (ref 0.0–1.9)

## 2016-01-19 LAB — LDL CHOLESTEROL, DIRECT: Direct LDL: 89 mg/dL

## 2016-01-19 NOTE — Progress Notes (Signed)
Subjective:    Patient ID: Junious SilkSharon A Ellis, female    DOB: 01/04/1946, 70 y.o.   MRN: [id number]  HPI  70 year old patient who is seen today for follow-up. She has type 2 diabetes.  Last hemoglobin A1c 6.8.  She has been on low-dose glimepiride in the past, which has been discontinued.  She remains on metformin and pioglitazone. She has hypothyroidism and dyslipidemia. She was given a trial myrbetriq which was not effective for urinary frequency. She has significant osteoarthritis and is followed by orthopedics. She has chronic low back pain and left knee pain.  Past Medical History:  Diagnosis Date  . Anal fissure   . ANEMIA-NOS 09/18/2009   takes Ferrous Sulfate daily  . Anxiety    takes Xanax prn sleep and anxiety  . Cancer (HCC)    basal cell left cheek  . Chronic back pain    scoliosis/stenosis/spondylosis  . DEPRESSION 09/18/2009   takes Celexa daily  . DIABETES MELLITUS, TYPE II 09/18/2009   takes Amaryl daily and Metformin  . GERD 09/18/2009   takes Omeprazole daily  . Headache(784.0)    sinus HA  . Hemorrhoids   . Histoplasmosis    hx of  . History of bronchitis    last time around 1990  . History of colon polyps   . HYPERLIPIDEMIA 09/18/2009   takes Lipitor daily  . HYPERTENSION 09/18/2009   takes Atenolol daily and Lisinopril  . HYPOTHYROIDISM 09/18/2009  . Insomnia    takes trazodone and ambien nightly  . Joint pain   . Joint swelling   . Morbid obesity (HCC)   . Obesity   . OSA (obstructive sleep apnea)    has a CPAP and sleep study report in epic from 2009  . OSTEOARTHRITIS 09/18/2009  . Peripheral neuropathy (HCC)    takes Lyrica daily  . Shortness of breath    with exertion  . Skin spots, red    right above both elbows;pt states always there  . Spondylolisthesis at L1-L2 level   . Umbilical hernia   . Urinary frequency   . Urinary urgency      Social History   Social History  . Marital status: Single    Spouse name: N/A  . Number of  children: N/A  . Years of education: N/A   Occupational History  . retired    Social History Main Topics  . Smoking status: Former Smoker    Packs/day: 1.00    Years: 20.00    Types: Cigarettes    Quit date: 02/23/2004  . Smokeless tobacco: Never Used     Comment: quit 2007  . Alcohol use 0.0 oz/week     Comment: rare  . Drug use: No  . Sexual activity: No   Other Topics Concern  . Not on file   Social History Narrative  . No narrative on file    Past Surgical History:  Procedure Laterality Date  . ANTERIOR CERVICAL DECOMP/DISCECTOMY FUSION N/A 09/02/2014   Procedure: Cervical five- six  Anterior cervical decompression fusion;  Surgeon: Barnett AbuHenry Elsner, MD;  Location: [facility];  Service: Neurosurgery;  Laterality: N/A;  C5-6 Anterior cervical decompression/diskectomy/fusion  . BACK SURGERY    . CARPAL TUNNEL RELEASE     RIGHT      2016   . COLONOSCOPY  2007  . ESOPHAGOGASTRODUODENOSCOPY  2007  . HEMORRHOID SURGERY  03/23/2011   Procedure: HEMORRHOIDECTOMY;  Surgeon: Clovis Puhomas A. Cornett, MD;  Location:  [facility];  Service: General;  Laterality: N/A;  lateral internal sphincterotomy and hemorrhoidectomy  . SPHINCTEROTOMY    . THORACIC DISCECTOMY N/A 12/15/2012   Procedure: Thoracic ten-eleven Thoracic laminectomy;  Surgeon: Barnett AbuHenry Elsner, MD;  Location: [facility];  Service: Neurosurgery;  Laterality: N/A;  Thoracic ten-eleven Thoracic laminectomy  . tubal fulgaration  1991    Family History  Problem Relation Age of Onset  . Diabetes Father   . COPD Father   . Cancer Maternal Grandfather     stomach    Allergies  Allergen Reactions  . Duraprep [Tersaseptic] Itching and Rash    Irritation everywhere prep was used  . Hydromorphone Hcl     Makes crazy  . Morphine And Related Other (See Comments)    hallucinations    Current Outpatient Prescriptions on File Prior to Visit  Medication Sig Dispense Refill  . ALPRAZolam (XANAX) 0.5 MG tablet Take 1  tablet (0.5 mg total) by mouth 3 (three) times daily. 90 tablet 5  . atenolol (TENORMIN) 25 MG tablet Take 1 tablet by mouth  daily 90 tablet 1  . atorvastatin (LIPITOR) 40 MG tablet TAKE 1 TABLET BY MOUTH ONCE DAILY 90 tablet 3  . Calcium Carb-Cholecalciferol (CALCIUM 600/VITAMIN D3 PO) Take 1 tablet by mouth 2 (two) times daily.    . Carboxymethylcellulose Sodium (LUBRICANT EYE DROPS OP) Apply 2 drops to eye 4 (four) times daily as needed.    . citalopram (CELEXA) 40 MG tablet TAKE 1 TABLET BY MOUTH  DAILY 90 tablet 0  . docusate sodium (COLACE) 100 MG capsule Take 100 mg by mouth 2 (two) times daily.    Marland Kitchen. estradiol (ESTRACE) 0.1 MG/GM vaginal cream Place 1 Applicatorful vaginally 3 (three) times a week. 42.5 g 12  . gemfibrozil (LOPID) 600 MG tablet TAKE ONE TABLET BY MOUTH  TWICE DAILY BEFORE MEALS 180 tablet 1  . HYDROcodone-acetaminophen (NORCO/VICODIN) 5-325 MG tablet 1 by mouth every 6 hours as needed for pain **DO NOT EXCEED 4 GM OF TYLENOL IN 24 HOURS** 120 tablet 0  . hydrocortisone 2.5 % cream Apply 1 application topically 2 (two) times daily.    . Lancets (ONETOUCH ULTRASOFT) lancets Use daily 100 each 6  . levothyroxine (SYNTHROID, LEVOTHROID) 150 MCG tablet TAKE 1 TABLET BY MOUTH ONCE DAILY 90 tablet 0  . lisinopril (PRINIVIL,ZESTRIL) 10 MG tablet TAKE 1 TABLET BY MOUTH ONCE DAILY 90 tablet 0  . loratadine (CLARITIN) 10 MG tablet Take 10 mg by mouth daily as needed for allergies.    . meloxicam (MOBIC) 15 MG tablet TAKE 1 TABLET BY MOUTH  DAILY 90 tablet 0  . metFORMIN (GLUCOPHAGE) 1000 MG tablet TAKE 1 TABLET BY MOUTH TWO  TIMES DAILY WITH MEALS 180 tablet 1  . methocarbamol (ROBAXIN) 500 MG tablet Take 500 mg by mouth every 6 (six) hours as needed for muscle spasms.    . Multiple Vitamin (MULTIVITAMIN) tablet Take 1 tablet by mouth daily.    . Omega-3 Fatty Acids (FISH OIL) 1200 MG CAPS Take 2 capsules by mouth 2 (two) times daily.    Marland Kitchen. omeprazole (PRILOSEC) 20 MG capsule Take 1  capsule by mouth two times daily 180 capsule 3  . ONE TOUCH ULTRA TEST test strip Use 1 daily as directed 100 each 3  . pioglitazone (ACTOS) 15 MG tablet Take 1 tablet (15 mg total) by mouth daily. 90 tablet 3  . pregabalin (LYRICA) 75 MG capsule Take 1 capsule (75 mg total) by mouth daily. 90 capsule 1  .  rOPINIRole (REQUIP) 1 MG tablet Take 1 mg by mouth at bedtime.     Marland Kitchen tobramycin (TOBREX) 0.3 % ophthalmic ointment Place 1 application into the right eye. Apply the day before, the day of, and the day after eye injections.    . traZODone (DESYREL) 100 MG tablet TAKE 2 TABLETS BY MOUTH AT  BEDTIME 180 tablet 0   No current facility-administered medications on file prior to visit.     BP 130/62 (BP Location: Left Arm, Patient Position: Sitting, Cuff Size: Normal)   Pulse 78   Temp 98.3 F (36.8 C) (Oral)   Resp 20   Ht 5\' 2"  (1.575 m)   Wt 203 lb 4 oz (92.2 kg)   SpO2 96%   BMI 37.17 kg/m     Review of Systems  Constitutional: Negative.   HENT: Negative for congestion, dental problem, hearing loss, rhinorrhea, sinus pressure, sore throat and tinnitus.   Eyes: Negative for pain, discharge and visual disturbance.  Respiratory: Negative for cough and shortness of breath.   Cardiovascular: Negative for chest pain, palpitations and leg swelling.  Gastrointestinal: Negative for abdominal distention, abdominal pain, blood in stool, constipation, diarrhea, nausea and vomiting.  Genitourinary: Positive for frequency. Negative for difficulty urinating, dysuria, flank pain, hematuria, pelvic pain, urgency, vaginal bleeding, vaginal discharge and vaginal pain.  Musculoskeletal: Positive for back pain, gait problem and neck pain. Negative for arthralgias and joint swelling.  Skin: Negative for rash.  Neurological: Negative for dizziness, syncope, speech difficulty, weakness, numbness and headaches.  Hematological: Negative for adenopathy.  Psychiatric/Behavioral: Negative for agitation,  behavioral problems and dysphoric mood. The patient is not nervous/anxious.        Objective:   Physical Exam  Constitutional: She is oriented to person, place, and time. She appears well-developed and well-nourished.  Weight 202.4 Blood pressure 130/60  HENT:  Head: Normocephalic.  Right Ear: External ear normal.  Left Ear: External ear normal.  Mouth/Throat: Oropharynx is clear and moist.  Eyes: Conjunctivae and EOM are normal. Pupils are equal, round, and reactive to light.  Puffiness in the right infraorbital area  Neck: Normal range of motion. Neck supple. No thyromegaly present.  Cardiovascular: Normal rate, regular rhythm, normal heart sounds and intact distal pulses.   Pulmonary/Chest: Effort normal and breath sounds normal.  Abdominal: Soft. Bowel sounds are normal. She exhibits no mass. There is no tenderness.  Musculoskeletal: Normal range of motion.  Lymphadenopathy:    She has no cervical adenopathy.  Neurological: She is alert and oriented to person, place, and time.  Skin: Skin is warm and dry. No rash noted.  Psychiatric: She has a normal mood and affect. Her behavior is normal.          Assessment & Plan:   Diabetes mellitus.  Will check a hemoglobin A1c.  Weight loss encouraged Chronic low back pain Hypothyroidism.  We'll check TSH Dyslipidemia.  Check lipid profile Osteoarthritis.  Follow-up orthopedics  Weight loss encouraged Follow-up 3 months Kegel exercises recommended for urinary frequency  KWIATKOWSKI,PETER Homero Fellers

## 2016-01-19 NOTE — Patient Instructions (Signed)
Limit your sodium (Salt) intake  You need to lose weight.  Consider a lower calorie diet and regular exercise.   Please check your hemoglobin A1c every 3 months  Take a calcium supplement, plus [phone] units of vitamin D

## 2016-01-19 NOTE — Progress Notes (Signed)
Pre visit review using our clinic review tool, if applicable. No additional management support is needed unless otherwise documented below in the visit note. 

## 2016-02-08 ENCOUNTER — Other Ambulatory Visit: Payer: Self-pay | Admitting: Internal Medicine

## 2016-02-09 ENCOUNTER — Telehealth: Payer: Self-pay | Admitting: Internal Medicine

## 2016-02-09 ENCOUNTER — Other Ambulatory Visit: Payer: Self-pay | Admitting: Internal Medicine

## 2016-02-09 MED ORDER — HYDROCODONE-ACETAMINOPHEN 5-325 MG PO TABS: ORAL_TABLET | ORAL | 0 refills | Status: DC

## 2016-02-09 NOTE — Telephone Encounter (Signed)
Okay to refill? Last filled 01/13/16 #120 refills: 0  Last OV 01/19/16

## 2016-02-09 NOTE — Telephone Encounter (Signed)
Pt notified Rx ready for pickup. Rx printed and signed.  

## 2016-02-09 NOTE — Telephone Encounter (Signed)
Pt needs refill on HYDROcodone-acetaminophen (NORCO/VICODIN) 5-325 MG tablet

## 2016-02-09 NOTE — Telephone Encounter (Signed)
What we discussed.  

## 2016-02-09 NOTE — Telephone Encounter (Signed)
Okay #120 Notify patient that this prescription is early and prescriptions must last 30 days

## 2016-02-09 NOTE — Telephone Encounter (Signed)
Called and informed pt of recommendation pt states that she knows she should not be out of medication. Her back pain is horrific and most times she cannot wait 6 hours to take another. I informed pt that I would forward her concern to Dr. Kirtland BouchardK. Pt verbalized understanding. Nothing further needed at this time.

## 2016-02-10 DIAGNOSIS — H43813 Vitreous degeneration, bilateral: Secondary | ICD-10-CM | POA: Diagnosis not present

## 2016-02-10 DIAGNOSIS — H353211 Exudative age-related macular degeneration, right eye, with active choroidal neovascularization: Secondary | ICD-10-CM | POA: Diagnosis not present

## 2016-02-10 DIAGNOSIS — H35423 Microcystoid degeneration of retina, bilateral: Secondary | ICD-10-CM | POA: Diagnosis not present

## 2016-02-10 DIAGNOSIS — H353123 Nonexudative age-related macular degeneration, left eye, advanced atrophic without subfoveal involvement: Secondary | ICD-10-CM | POA: Diagnosis not present

## 2016-02-22 ENCOUNTER — Other Ambulatory Visit: Payer: Self-pay | Admitting: Internal Medicine

## 2016-02-28 ENCOUNTER — Other Ambulatory Visit: Payer: Self-pay | Admitting: Internal Medicine

## 2016-03-04 DIAGNOSIS — M48062 Spinal stenosis, lumbar region with neurogenic claudication: Secondary | ICD-10-CM | POA: Diagnosis not present

## 2016-03-09 ENCOUNTER — Ambulatory Visit: Payer: Medicare Other | Attending: Neurological Surgery | Admitting: Physical Therapy

## 2016-03-09 ENCOUNTER — Telehealth: Payer: Self-pay | Admitting: Internal Medicine

## 2016-03-09 DIAGNOSIS — R262 Difficulty in walking, not elsewhere classified: Secondary | ICD-10-CM | POA: Diagnosis not present

## 2016-03-09 DIAGNOSIS — M6281 Muscle weakness (generalized): Secondary | ICD-10-CM

## 2016-03-09 DIAGNOSIS — G8929 Other chronic pain: Secondary | ICD-10-CM | POA: Diagnosis not present

## 2016-03-09 DIAGNOSIS — M5440 Lumbago with sciatica, unspecified side: Secondary | ICD-10-CM | POA: Insufficient documentation

## 2016-03-09 DIAGNOSIS — M5417 Radiculopathy, lumbosacral region: Secondary | ICD-10-CM

## 2016-03-09 NOTE — Therapy (Signed)
[facility] [address] [place], [place], [address] Phone: [phone]   Fax:  [phone]  Physical Therapy Evaluation  Patient Details  Name: Katie Booker MRN: [id number] Date of Birth: [birth year] Referring Provider: Dr. Danielle Dess   Encounter Date: 03/09/2016      PT End of Session - 03/09/16 1521    Visit Number 1   Number of Visits 16   Date for PT Re-Evaluation 04/06/16   PT Start Time 1500   PT Stop Time 1613   PT Time Calculation (min) 73 min   Activity Tolerance Patient tolerated treatment well   Behavior During Therapy [facility] for tasks assessed/performed      Past Medical History:  Diagnosis Date  . Anal fissure   . ANEMIA-NOS 09/18/2009   takes Ferrous Sulfate daily  . Anxiety    takes Xanax prn sleep and anxiety  . Cancer (HCC)    basal cell left cheek  . Chronic back pain    scoliosis/stenosis/spondylosis  . DEPRESSION 09/18/2009   takes Celexa daily  . DIABETES MELLITUS, TYPE II 09/18/2009   takes Amaryl daily and Metformin  . GERD 09/18/2009   takes Omeprazole daily  . Headache(784.0)    sinus HA  . Hemorrhoids   . Histoplasmosis    hx of  . History of bronchitis    last time around 1990  . History of colon polyps   . HYPERLIPIDEMIA 09/18/2009   takes Lipitor daily  . HYPERTENSION 09/18/2009   takes Atenolol daily and Lisinopril  . HYPOTHYROIDISM 09/18/2009  . Insomnia    takes trazodone and ambien nightly  . Joint pain   . Joint swelling   . Morbid obesity (HCC)   . Obesity   . OSA (obstructive sleep apnea)    has a CPAP and sleep study report in epic from 2009  . OSTEOARTHRITIS 09/18/2009  . Peripheral neuropathy (HCC)    takes Lyrica daily  . Shortness of breath    with exertion  . Skin spots, red    right above both elbows;pt states always there  . Spondylolisthesis at L1-L2 level   . Umbilical hernia   . Urinary frequency   . Urinary urgency     Past Surgical History:  Procedure  Laterality Date  . ANTERIOR CERVICAL DECOMP/DISCECTOMY FUSION N/A 09/02/2014   Procedure: Cervical five- six  Anterior cervical decompression fusion;  Surgeon: Barnett Abu, MD;  Location: [facility];  Service: Neurosurgery;  Laterality: N/A;  C5-6 Anterior cervical decompression/diskectomy/fusion  . BACK SURGERY    . CARPAL TUNNEL RELEASE     RIGHT      2016   . COLONOSCOPY  2007  . ESOPHAGOGASTRODUODENOSCOPY  2007  . HEMORRHOID SURGERY  03/23/2011   Procedure: HEMORRHOIDECTOMY;  Surgeon: Clovis Pu. Cornett, MD;  Location: [facility];  Service: General;  Laterality: N/A;  lateral internal sphincterotomy and hemorrhoidectomy  . SPHINCTEROTOMY    . THORACIC DISCECTOMY N/A 12/15/2012   Procedure: Thoracic ten-eleven Thoracic laminectomy;  Surgeon: Barnett Abu, MD;  Location: [facility];  Service: Neurosurgery;  Laterality: N/A;  Thoracic ten-eleven Thoracic laminectomy  . tubal fulgaration  1991    There were no vitals filed for this visit.       Subjective Assessment - 03/09/16 1459    Subjective Pt requested PT (vis Dr. Danielle Dess) for continued pain in low back.  She has had PT following her surgeries (SNF and HHPT) but not outpatient rehab.  She has  continued to walk with her walker since the surgery, she feels off balance and it is worse than it has been in the past. She has muscle tightness in bilateral hips, low back.  She has tingling in bottom of feet with sitting and laying down.  Feels weak in arms and both legs.  She is not getting the relief she used to with rest, laying down.  She can no longer do normal ADLs and housework as she used to.     Pertinent History L1-L5 fusion (2 separate 11/2012, 02/2015) Thoracic laminectomy, C5-C6 ACDF (2016) , L1-L2 PLIF (02/2015) L knee OA with cortisone Oct. 2017   Limitations Sitting;Lifting;Standing;Walking;House hold activities   How long can you stand comfortably? <5 min without walker, is usually OK for a couple hours with meds  and with her walker.     Diagnostic tests none this year, see chart for multiple reports    Patient Stated Goals Pain relief and confidence    Currently in Pain? No/denies  none at rest    Pain Score 7   avg pain if not mindful    Pain Location Back   Pain Orientation Right;Lower   Pain Type Chronic pain   Pain Radiating Towards both hips and legs , Rt. > LT    Pain Onset More than a month ago   Pain Frequency Intermittent   Aggravating Factors  no walker    Pain Relieving Factors used to be fully relieved with pain meds and rest.  Sitting helps.    Effect of Pain on Daily Activities depressed because her home is not clean, makes things frustrating, this keeps me at home.             [facility] - 03/09/16 1512      Assessment   Medical Diagnosis spinal stenosis    Referring Provider Dr. Danielle DessElsner    Onset Date/Surgical Date 03/18/15   Hand Dominance Right   Prior Therapy Not OP PT      Precautions   Precautions Fall     Restrictions   Weight Bearing Restrictions No     Balance Screen   Has the patient fallen in the past 6 months No   Has the patient had a decrease in activity level because of a fear of falling?  Yes   Is the patient reluctant to leave their home because of a fear of falling?  Yes     Home Environment   Living Environment Private residence   Living Arrangements Alone   Type of Home House   Home Equipment Walker - 4 wheels     Prior Function   Level of Independence Independent with basic ADLs;Independent with household mobility with device;Independent with community mobility with device   Vocation Retired   [employer]er[employer]ocation Requirements Worked as a Solicitorclerk as [employer]     Cognition   Overall Cognitive Status Within Functional Limits for tasks assessed     Observation/Other Assessments   Focus on Therapeutic Outcomes (FOTO)  59%     Sensation   Light Touch Impaired Detail   Additional Comments numb bottom of feet      Posture/Postural Control    Posture/Postural Control Postural limitations   Postural Limitations Forward head;Decreased lumbar lordosis   Posture Comments narrow hips, obese abdominals and rounded upper back      AROM   Lumbar Flexion flat back hinge no pain increase    Lumbar Extension 75-80% with discomfort    Lumbar -  Right Side Bend min movement   Lumbar - Left Side Bend min movement   Lumbar - Right Rotation pain, 50%    Lumbar - Left Rotation pain, 50%      Strength   Right Hip Flexion 4-/5   Right Hip Extension 3+/5   Right Hip ABduction 3+/5   Left Hip Flexion 4-/5   Left Hip Extension 3+/5   Left Hip ABduction 3/5   Right/Left Knee --  5/5   Right/Left Ankle --  ankle DF 5/5     Flexibility   Hamstrings 75-80 deg   Quadriceps tight , tight hip flexors   ITB tender, tight    Piriformis sore on Rt. side      Palpation   Spinal mobility NT    Palpation comment muscles are taut in LEs, pain with palpation to Rt. side of low lumbar paraspinals and QL      Ambulation/Gait   Ambulation/Gait Yes   Ambulation/Gait Assistance 6: Modified independent (Device/Increase time)   Ambulation Distance (Feet) 150 Feet   Assistive device 4-wheeled walker   Ambulation Surface Level;Indoor                   [facility] Adult PT Treatment/Exercise - 03/09/16 1512      Lumbar Exercises: Stretches   Active Hamstring Stretch 1 rep   Single Knee to Chest Stretch 3 reps;30 seconds   Lower Trunk Rotation 10 seconds   Lower Trunk Rotation Limitations x 10      Lumbar Exercises: Supine   Bridge 10 reps   Bridge Limitations cramp in hamstring and stretch to quads      Moist Heat Therapy   Number Minutes Moist Heat 15 Minutes   Moist Heat Location Lumbar Spine     Electrical Stimulation   Electrical Stimulation Location lumbar    Electrical Stimulation Action IFC   Electrical Stimulation Parameters to tol (17)    Electrical Stimulation Goals Pain                PT Education - 03/09/16  1618    Education provided Yes   Education Details PT/POC, HEP, basic back ex, IFC, home TENS    Person(s) Educated Patient   Methods Explanation;Demonstration;Handout   Comprehension Verbalized understanding;Returned demonstration;Need further instruction;Verbal cues required;Tactile cues required          PT Short Term Goals - 03/09/16 1630      PT SHORT TERM GOAL #1   Title Pt will be complete balance assessment and set goal.     Time 4   Period Weeks   Status New     PT SHORT TERM GOAL #2   Title Pt will be I with HEP    Time 4   Period Weeks   Status New     PT SHORT TERM GOAL #3   Title Pt will be able to report min pain in low back with transfers, rolling over in bed or on therapy mat.    Time 4   Period Weeks   Status New     PT SHORT TERM GOAL #4   Title Pt will be able to walk in grocery store with cart and report min fatigue, pain overall.    Time 4   Period Weeks   Status New           PT Long Term Goals - 03/09/16 1632      PT LONG TERM GOAL #1   Title Pt will  be able to understand good body mechanics, posture and utilize at home for long term pain control.    Time 8   Period Weeks   Status New     PT LONG TERM GOAL #2   Title Pt will be able to use 3 self care strategies for home use (other than pain meds) to reduce pain.    Time 8   Period Weeks   Status New     PT LONG TERM GOAL #3   Title Pt will increase hip strength to 4+/5 in abduction, extension to improve tolerance for gait with LRAD.    Time 8   Period Weeks   Status New     PT LONG TERM GOAL #4   Title FOTO score will improve to less than 40% impaired.    Time 8   Period Weeks   Status New     PT LONG TERM GOAL #5   Title Pt will be able to complete ADLs, light housework for up to 15 min in standing with no increase in back pain.    Time 8   Period Weeks   Status New               Plan - April 06, 2016 1619    Clinical Impression Statement Patient with mod  complexity eval for low back pain and LE pain due to spinal stenosis.  She has had multiple spinal surgeries and has significant pain, activity intolerance and hip/core weakness.  She also has UE weakness which was not fully assessed today as well as tenderness in lateral hips (Gr. Trochanter) which impacts her ability to rest and use UE for grooming, kitchen tasks. She has not tried OPPT and is hopeful we can facilitate her ease and continued independence with mobility and ADLs    Rehab Potential Excellent   PT Frequency 2x / week   PT Duration 8 weeks   PT Treatment/Interventions ADLs/Self Care Home Management;Moist Heat;Therapeutic exercise;Ultrasound;Taping;Manual techniques;Balance training;Neuromuscular re-education;Electrical Stimulation;Cryotherapy;Iontophoresis 4mg /ml Dexamethasone;Functional mobility training;Gait training;Stair training;Patient/family education;Passive range of motion   PT Next Visit Plan check initial HEP, NuStep, balance assessment and progress tolerance for standing, functional activities.    PT Home Exercise Plan basic back: LTR, hamstring, knee to chest , abdominal isometric, bridge    Consulted and Agree with Plan of Care Patient      Patient will benefit from skilled therapeutic intervention in order to improve the following deficits and impairments:  Decreased range of motion, Difficulty walking, Increased fascial restricitons, Obesity, Impaired UE functional use, Pain, Decreased activity tolerance, Decreased balance, Impaired flexibility, Hypomobility, Postural dysfunction, Impaired sensation, Decreased strength, Decreased mobility  Visit Diagnosis: Chronic low back pain with sciatica, sciatica laterality unspecified, unspecified back pain laterality  Difficulty in walking, not elsewhere classified  Muscle weakness (generalized)  Radiculopathy, lumbosacral region      G-Codes - 2016-04-06 1552    Functional Assessment Tool Used FOTO   Functional Limitation  Mobility: Walking and moving around   Mobility: Walking and Moving Around Current Status [phone]) At least 40 percent but less than 60 percent impaired, limited or restricted   Mobility: Walking and Moving Around Goal Status [phone]) At least 20 percent but less than 40 percent impaired, limited or restricted       Problem List Patient Active Problem List   Diagnosis Date Noted  . Spondylolisthesis at L1-L2 level 03/18/2015  . Spondylosis with myelopathy 09/02/2014  . Cervical spondylosis with myelopathy 09/02/2014  . Anxiety 12/21/2012  .  Chronic back pain 12/21/2012  . Insomnia 12/21/2012  . 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 Spinal stenosis, lumbar region, with neurogenic claudication 10/26/2012  . Spinal stenosis, thoracic 10/26/2012  . Umbilical hernia 02/25/2012  . OSA (obstructive sleep apnea) 11/12/2011  . Exogenous obesity 11/12/2011  . 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 Hypothyroidism 09/18/2009  . Diabetes mellitus without complication (HCC) 09/18/2009  . Dyslipidemia 09/18/2009  . Anemia 09/18/2009  . Depression 09/18/2009  . Essential hypertension 09/18/2009  . GERD 09/18/2009  . Osteoarthritis 09/18/2009    PAA,JENNIFER 03/09/2016, 4:40 PM  [facility] [address] [place], [place], [address] Phone: [phone]   Fax:  [phone]  Name: Katie Booker MRN: [id number] Date of Birth: 07-09-1945  Karie Mainland, PT 03/09/16 4:40 PM Phone: [phone] Fax: [phone]

## 2016-03-09 NOTE — Patient Instructions (Addendum)
Bridge   Lie back, legs bent. Inhale, pressing hips up. Keeping ribs in, lengthen lower back. Exhale, rolling down along spine from top. Repeat _10_ times. Do ___2_ sessions per day.  Copyright  VHI. All rights reserved.   Pelvic Tilt   Flatten back by tightening stomach muscles and buttocks. Draw in abdominals and pelvic floor mm gently.   Repeat __10__ times per set. Do __1-2__ sets per session. Do ___2_ sessions per day.  http://orth.exer.us/134   Copyright  VHI. All rights reserved.  Knee to Chest (Flexion)   Pull knee toward chest. Feel stretch in lower back or buttock area. Breathing deeply, Hold __30__ seconds. Repeat with other knee. Repeat __3__ times. Do __2_ sessions per day.  http://gt2.exer.us/225   Copyright  VHI. All rights reserved.   Lower Trunk Rotation Stretch   Keeping back flat and feet together, rotate knees side to side, feel a stretch but no lasting pain.  Hold __5-15 seconds. Repeat ___10 times per set. Do ___1-2_ sets per session. Do __2__ sessions per day.  http://orth.exer.us/122   Copyright  VHI. All rights reserved.  Supine: Leg Stretch With Strap (Basic)   Lie on back with one knee bent, foot flat on floor. Hook strap around other foot. Straighten knee. Keep knee level with other knee. Hold __30_ seconds. Relax leg completely down to floor.  Repeat __2-3_ times per session. Do _2__ sessions per day.  Copyright  VHI. All rights reserved.   HIP: Hamstrings - Short Sitting    Rest leg on raised surface. Keep knee straight. Lift chest. Hold __30_ seconds. __3_ reps per set, __1_ sets per day, __5_ days per week  Copyright  VHI. All rights reserved.

## 2016-03-09 NOTE — Telephone Encounter (Signed)
Pt request refill  °HYDROcodone-acetaminophen (NORCO/VICODIN) 5-325 MG tablet °

## 2016-03-12 ENCOUNTER — Other Ambulatory Visit: Payer: Self-pay | Admitting: Internal Medicine

## 2016-03-12 MED ORDER — HYDROCODONE-ACETAMINOPHEN 5-325 MG PO TABS: ORAL_TABLET | ORAL | 0 refills | Status: DC

## 2016-03-12 NOTE — Telephone Encounter (Signed)
Pt notified Rx ready for pickup. Rx printed and signed.  

## 2016-03-19 ENCOUNTER — Ambulatory Visit: Payer: Medicare Other | Admitting: Physical Therapy

## 2016-03-23 ENCOUNTER — Ambulatory Visit: Payer: Medicare Other | Admitting: Physical Therapy

## 2016-03-23 DIAGNOSIS — R262 Difficulty in walking, not elsewhere classified: Secondary | ICD-10-CM

## 2016-03-23 DIAGNOSIS — M6281 Muscle weakness (generalized): Secondary | ICD-10-CM

## 2016-03-23 DIAGNOSIS — G8929 Other chronic pain: Secondary | ICD-10-CM | POA: Diagnosis not present

## 2016-03-23 DIAGNOSIS — M5417 Radiculopathy, lumbosacral region: Secondary | ICD-10-CM

## 2016-03-23 DIAGNOSIS — M5440 Lumbago with sciatica, unspecified side: Secondary | ICD-10-CM | POA: Diagnosis not present

## 2016-03-23 NOTE — Therapy (Signed)
[place], [place], [address] Phone: [phone]   Fax:  [phone]  Physical Therapy Treatment  Patient Details  Name: Katie Booker MRN: [id number] Date of Birth: 1945-09-18 Referring Provider: Dr. Ellene Route   Encounter Date: 03/23/2016      PT End of Session - 03/23/16 1425    Visit Number 2   Number of Visits 16   Date for PT Re-Evaluation 05/04/16   PT Start Time 3976   PT Stop Time 1413   PT Time Calculation (min) 40 min   Activity Tolerance Patient tolerated treatment well   Behavior During Therapy [facility] for tasks assessed/performed      Past Medical History:  Diagnosis Date  . Anal fissure   . ANEMIA-NOS 09/18/2009   takes Ferrous Sulfate daily  . Anxiety    takes Xanax prn sleep and anxiety  . Cancer ([place])    basal cell left cheek  . Chronic back pain    scoliosis/stenosis/spondylosis  . DEPRESSION 09/18/2009   takes Celexa daily  . DIABETES MELLITUS, TYPE II 09/18/2009   takes Amaryl daily and Metformin  . GERD 09/18/2009   takes Omeprazole daily  . Headache(784.0)    sinus HA  . Hemorrhoids   . Histoplasmosis    hx of  . History of bronchitis    last time around 1990  . History of colon polyps   . HYPERLIPIDEMIA 09/18/2009   takes Lipitor daily  . HYPERTENSION 09/18/2009   takes Atenolol daily and Lisinopril  . HYPOTHYROIDISM 09/18/2009  . Insomnia    takes trazodone and ambien nightly  . Joint pain   . Joint swelling   . Morbid obesity ([place])   . Obesity   . OSA (obstructive sleep apnea)    has a CPAP and sleep study report in epic from 2009  . OSTEOARTHRITIS 09/18/2009  . Peripheral neuropathy (HCC)    takes Lyrica daily  . Shortness of breath    with exertion  . Skin spots, red    right above both elbows;pt states always there  . Spondylolisthesis at L1-L2 level   . Umbilical hernia   . Urinary frequency   . Urinary urgency     Past Surgical History:  Procedure  Laterality Date  . ANTERIOR CERVICAL DECOMP/DISCECTOMY FUSION N/A 09/02/2014   Procedure: Cervical five- six  Anterior cervical decompression fusion;  Surgeon: Kristeen Miss, MD;  Location: [place] [facility];  Service: Neurosurgery;  Laterality: N/A;  C5-6 Anterior cervical decompression/diskectomy/fusion  . BACK SURGERY    . CARPAL TUNNEL RELEASE     RIGHT      2016   . COLONOSCOPY  2007  . ESOPHAGOGASTRODUODENOSCOPY  2007  . HEMORRHOID SURGERY  03/23/2011   Procedure: HEMORRHOIDECTOMY;  Surgeon: Joyice Faster. Cornett, MD;  Location: [facility];  Service: General;  Laterality: N/A;  lateral internal sphincterotomy and hemorrhoidectomy  . SPHINCTEROTOMY    . THORACIC DISCECTOMY N/A 12/15/2012   Procedure: Thoracic ten-eleven Thoracic laminectomy;  Surgeon: Kristeen Miss, MD;  Location: [place] [facility];  Service: Neurosurgery;  Laterality: N/A;  Thoracic ten-eleven Thoracic laminectomy  . tubal fulgaration  1991    There were no vitals filed for this visit.      Subjective Assessment - 03/23/16 1337    Subjective restless leg pain in Left at night, better with medication.  Feels like her balance is worse since her last surgery.    Currently in Pain? Yes   Pain  Location Back  and hip   Pain Descriptors / Indicators --  deep inside pain in hips with walking,  no number,  worese  6/10.  better with stopping the activity.    Pain Radiating Towards both hips   Pain Frequency Intermittent   Aggravating Factors  walking                         [facility] Adult PT Treatment/Exercise - 03/23/16 0001      Berg Balance Test   Sit to Stand Able to stand without using hands and stabilize independently   Standing Unsupported Able to stand safely 2 minutes   Sitting with Back Unsupported but Feet Supported on Floor or Stool Able to sit safely and securely 2 minutes   Stand to Sit Sits safely with minimal use of hands   Transfers Able to transfer safely, minor use of hands    Standing Unsupported with Eyes Closed Able to stand 10 seconds safely   Standing Ubsupported with Feet Together Able to place feet together independently and stand for 1 minute with supervision   From Standing, Reach Forward with Outstretched Arm Can reach confidently >25 cm (10")   From Standing Position, Pick up Object from Floor Able to pick up shoe safely and easily   From Standing Position, Turn to Look Behind Over each Shoulder Turn sideways only but maintains balance   Turn 360 Degrees Needs close supervision or verbal cueing   Standing Unsupported, Alternately Place Feet on Step/Stool Able to stand independently and safely and complete 8 steps in 20 seconds   Standing Unsupported, One Foot in Front Able to place foot tandem independently and hold 30 seconds   Standing on One Leg Able to lift leg independently and hold > 10 seconds   Total Score 50                  PT Short Term Goals - 03/23/16 1428      PT SHORT TERM GOAL #1   Title Pt will be complete balance assessment and set goal.     Baseline 50/56   Time 4   Period Weeks   Status Partially Met     PT SHORT TERM GOAL #2   Title Pt will be I with HEP    Baseline not yet doing her home exercises   Time 4   Period Weeks   Status On-going     PT SHORT TERM GOAL #3   Title Pt will be able to report min pain in low back with transfers, rolling over in bed or on therapy mat.    Baseline unchanged pain with these activities.   Time 4   Period Weeks   Status On-going     PT SHORT TERM GOAL #4   Title Pt will be able to walk in grocery store with cart and report min fatigue, pain overall.    Time 4   Period Weeks   Status Unable to assess           PT Long Term Goals - 03/09/16 1632      PT LONG TERM GOAL #1   Title Pt will be able to understand good body mechanics, posture and utilize at home for long term pain control.    Time 8   Period Weeks   Status New     PT LONG TERM GOAL #2   Title Pt  will be able to use  3 self care strategies for home use (other than pain meds) to reduce pain.    Time 8   Period Weeks   Status New     PT LONG TERM GOAL #3   Title Pt will increase hip strength to 4+/5 in abduction, extension to improve tolerance for gait with LRAD.    Time 8   Period Weeks   Status New     PT LONG TERM GOAL #4   Title FOTO score will improve to less than 40% impaired.    Time 8   Period Weeks   Status New     PT LONG TERM GOAL #5   Title Pt will be able to complete ADLs, light housework for up to 15 min in standing with no increase in back pain.    Time 8   Period Weeks   Status New               Plan - 03/23/16 1425    Clinical Impression Statement Extra time required for BERG patient needed to do a lot of explaining.  50/56 BERG.  Patient has not been doing her HEP,  and she feels guilty.  She has a goal of a trip to [facility] when PT is over and seems motivated.    PT Next Visit Plan check initial HEP, NuStep, Discuss results of balance assessment and progress tolerance for standing, functional activities. Consider tape for hips.   PT Home Exercise Plan basic back: LTR, hamstring, knee to chest , abdominal isometric, bridge    Consulted and Agree with Plan of Care Patient      Patient will benefit from skilled therapeutic intervention in order to improve the following deficits and impairments:  Decreased range of motion, Difficulty walking, Increased fascial restricitons, Obesity, Impaired UE functional use, Pain, Decreased activity tolerance, Decreased balance, Impaired flexibility, Hypomobility, Postural dysfunction, Impaired sensation, Decreased strength, Decreased mobility  Visit Diagnosis: Chronic low back pain with sciatica, sciatica laterality unspecified, unspecified back pain laterality  Difficulty in walking, not elsewhere classified  Muscle weakness (generalized)  Radiculopathy, lumbosacral region     Problem List Patient Active  Problem List   Diagnosis Date Noted  . Spondylolisthesis at L1-L2 level 03/18/2015  . Spondylosis with myelopathy 09/02/2014  . Cervical spondylosis with myelopathy 09/02/2014  . Anxiety 12/21/2012  . Chronic back pain 12/21/2012  . Insomnia 12/21/2012  . 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 Spinal stenosis, lumbar region, with neurogenic claudication 10/26/2012  . Spinal stenosis, thoracic 10/26/2012  . Umbilical hernia 14/78/2956  . OSA (obstructive sleep apnea) 11/12/2011  . Exogenous obesity 11/12/2011  . 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 Hypothyroidism 09/18/2009  . Diabetes mellitus without complication ([place]) 21/30/8657  . Dyslipidemia 09/18/2009  . Anemia 09/18/2009  . Depression 09/18/2009  . Essential hypertension 09/18/2009  . GERD 09/18/2009  . Osteoarthritis 09/18/2009    Shakyla Nolley PTA 03/23/2016, 2:32 PM  [facility] [address] [place], [place], [address] Phone: [phone]   Fax:  [phone]  Name: Katie Booker MRN: [id number] Date of Birth: [birth year]

## 2016-03-24 ENCOUNTER — Other Ambulatory Visit: Payer: Self-pay | Admitting: Neurological Surgery

## 2016-03-24 DIAGNOSIS — M48062 Spinal stenosis, lumbar region with neurogenic claudication: Secondary | ICD-10-CM

## 2016-03-25 ENCOUNTER — Ambulatory Visit: Payer: Medicare Other | Attending: Neurological Surgery | Admitting: Physical Therapy

## 2016-03-25 VITALS — BP 114/81 | HR 87

## 2016-03-25 DIAGNOSIS — M5417 Radiculopathy, lumbosacral region: Secondary | ICD-10-CM | POA: Insufficient documentation

## 2016-03-25 DIAGNOSIS — G8929 Other chronic pain: Secondary | ICD-10-CM | POA: Diagnosis not present

## 2016-03-25 DIAGNOSIS — M5440 Lumbago with sciatica, unspecified side: Secondary | ICD-10-CM | POA: Insufficient documentation

## 2016-03-25 DIAGNOSIS — R262 Difficulty in walking, not elsewhere classified: Secondary | ICD-10-CM | POA: Diagnosis not present

## 2016-03-25 DIAGNOSIS — M6281 Muscle weakness (generalized): Secondary | ICD-10-CM

## 2016-03-25 NOTE — Patient Instructions (Signed)
Knee High   Holding stable object, raise knee to hip level, then lower knee. Repeat with other knee. Complete __10_ repetitions. Do __2__ sessions per day.  ABDUCTION: Standing (Active)   Stand, feet flat. Lift right leg out to side. Use _0__ lbs. Complete __10_ repetitions. Perform __2_ sessions per day.         EXTENSION: Standing (Active)  Stand, both feet flat. Draw right leg behind body as far as possible. Use 0___ lbs. Complete 10 repetitions. Perform __2_ sessions per day.  Copyright  VHI. All rights reserved   

## 2016-03-26 DIAGNOSIS — H353211 Exudative age-related macular degeneration, right eye, with active choroidal neovascularization: Secondary | ICD-10-CM | POA: Diagnosis not present

## 2016-03-26 DIAGNOSIS — H35372 Puckering of macula, left eye: Secondary | ICD-10-CM | POA: Diagnosis not present

## 2016-03-26 DIAGNOSIS — H353123 Nonexudative age-related macular degeneration, left eye, advanced atrophic without subfoveal involvement: Secondary | ICD-10-CM | POA: Diagnosis not present

## 2016-03-26 DIAGNOSIS — H35433 Paving stone degeneration of retina, bilateral: Secondary | ICD-10-CM | POA: Diagnosis not present

## 2016-03-26 NOTE — Therapy (Signed)
[place], [place], [address] Phone: [phone]   Fax:  [phone]  Physical Therapy Treatment  Patient Details  Name: Katie Booker MRN: [id number] Date of Birth: 02-21-1946 Referring Provider: Dr. Ellene Route   Encounter Date: 03/25/2016      PT End of Session - 03/26/16 0806    Visit Number 3   Number of Visits 16   Date for PT Re-Evaluation 05/04/16   PT Start Time 1301   PT Stop Time 1345   PT Time Calculation (min) 44 min      Past Medical History:  Diagnosis Date  . Anal fissure   . ANEMIA-NOS 09/18/2009   takes Ferrous Sulfate daily  . Anxiety    takes Xanax prn sleep and anxiety  . Cancer ([place])    basal cell left cheek  . Chronic back pain    scoliosis/stenosis/spondylosis  . DEPRESSION 09/18/2009   takes Celexa daily  . DIABETES MELLITUS, TYPE II 09/18/2009   takes Amaryl daily and Metformin  . GERD 09/18/2009   takes Omeprazole daily  . Headache(784.0)    sinus HA  . Hemorrhoids   . Histoplasmosis    hx of  . History of bronchitis    last time around 1990  . History of colon polyps   . HYPERLIPIDEMIA 09/18/2009   takes Lipitor daily  . HYPERTENSION 09/18/2009   takes Atenolol daily and Lisinopril  . HYPOTHYROIDISM 09/18/2009  . Insomnia    takes trazodone and ambien nightly  . Joint pain   . Joint swelling   . Morbid obesity ([place])   . Obesity   . OSA (obstructive sleep apnea)    has a CPAP and sleep study report in epic from 2009  . OSTEOARTHRITIS 09/18/2009  . Peripheral neuropathy ([facility])    takes Lyrica daily  . Shortness of breath    with exertion  . Skin spots, red    right above both elbows;pt states always there  . Spondylolisthesis at L1-L2 level   . Umbilical hernia   . Urinary frequency   . Urinary urgency     Past Surgical History:  Procedure Laterality Date  . ANTERIOR CERVICAL DECOMP/DISCECTOMY FUSION N/A 09/02/2014   Procedure: Cervical five- six   Anterior cervical decompression fusion;  Surgeon: Kristeen Miss, MD;  Location: [facility];  Service: Neurosurgery;  Laterality: N/A;  C5-6 Anterior cervical decompression/diskectomy/fusion  . BACK SURGERY    . CARPAL TUNNEL RELEASE     RIGHT      2016   . COLONOSCOPY  2007  . ESOPHAGOGASTRODUODENOSCOPY  2007  . HEMORRHOID SURGERY  03/23/2011   Procedure: HEMORRHOIDECTOMY;  Surgeon: Joyice Faster. Cornett, MD;  Location: [facility];  Service: General;  Laterality: N/A;  lateral internal sphincterotomy and hemorrhoidectomy  . SPHINCTEROTOMY    . THORACIC DISCECTOMY N/A 12/15/2012   Procedure: Thoracic ten-eleven Thoracic laminectomy;  Surgeon: Kristeen Miss, MD;  Location: [facility];  Service: Neurosurgery;  Laterality: N/A;  Thoracic ten-eleven Thoracic laminectomy  . tubal fulgaration  1991    Vitals:   03/25/16 1334  BP: 114/81  Pulse: 87  SpO2: 97%        Subjective Assessment - 03/25/16 1306    Subjective I have pain in my hips and  my lower back going into my right hip. I have pain in both knees and bothe shins. Sometimes pain in upper back.    Currently in Pain? Yes   Pain  Score 2    Pain Location Back   Pain Orientation Right                         [facility] Adult PT Treatment/Exercise - 03/26/16 0001      Lumbar Exercises: Stretches   Active Hamstring Stretch 3 reps;30 seconds   Active Hamstring Stretch Limitations seated on edge of mat      Lumbar Exercises: Aerobic   Stationary Bike Nustep L3 UE/LE c/o pain in abdominals when thigh pushes into lower abdominals.      Lumbar Exercises: Standing   Other Standing Lumbar Exercises Standing 3 way hip x 10 each bilateral, repeated series x 1 -pt became short of breath during each round so checked vitals, SP02 90% during second round. BP 165/74     Lumbar Exercises: Seated   Other Seated Lumbar Exercises Seated pelvic rocking, abdominal bracing     Lumbar Exercises: Supine   Ab Set 5 reps    AB Set Limitations became SOB despite breathing cues                   PT Short Term Goals - 03/23/16 1428      PT SHORT TERM GOAL #1   Title Pt will be complete balance assessment and set goal.     Baseline 50/56   Time 4   Period Weeks   Status Partially Met     PT SHORT TERM GOAL #2   Title Pt will be I with HEP    Baseline not yet doing her home exercises   Time 4   Period Weeks   Status On-going     PT SHORT TERM GOAL #3   Title Pt will be able to report min pain in low back with transfers, rolling over in bed or on therapy mat.    Baseline unchanged pain with these activities.   Time 4   Period Weeks   Status On-going     PT SHORT TERM GOAL #4   Title Pt will be able to walk in grocery store with cart and report min fatigue, pain overall.    Time 4   Period Weeks   Status Unable to assess           PT Long Term Goals - 03/09/16 1632      PT LONG TERM GOAL #1   Title Pt will be able to understand good body mechanics, posture and utilize at home for long term pain control.    Time 8   Period Weeks   Status New     PT LONG TERM GOAL #2   Title Pt will be able to use 3 self care strategies for home use (other than pain meds) to reduce pain.    Time 8   Period Weeks   Status New     PT LONG TERM GOAL #3   Title Pt will increase hip strength to 4+/5 in abduction, extension to improve tolerance for gait with LRAD.    Time 8   Period Weeks   Status New     PT LONG TERM GOAL #4   Title FOTO score will improve to less than 40% impaired.    Time 8   Period Weeks   Status New     PT LONG TERM GOAL #5   Title Pt will be able to complete ADLs, light housework for up to 15 min in standing with no increase  in back pain.    Time 8   Period Weeks   Status New               Plan - 03/25/16 1328    Clinical Impression Statement Pt reports very minimal HEP. Attempted to review HEP however pt became SOB in supine. Instructed her in seated  pelvic tilts with abdominal engagement. We also performed hamstring stretch in seated. She inquired about her balance score. Informed her of her BERG results from last visit. She would like to work on balance with rotation and reaching outside BOS as this is when she feels least balanced. Instructed her in standing 3 way Hip exercises and added to HEP in hope that it will be more simple and increase her compliance. She became SOB and required several seated rest breaks. SPO2 down to 90% with during 3 way hip exercises. BP increased by 40 mmhg systolic taken in sitting after therex. Will need to monitor her going forward.    PT Next Visit Plan narrow balance, and reaching outside BOS. Review standing hip, supine trunk stretches as tolerated, core in various positions, monitor vitals    PT Home Exercise Plan basic back: LTR, hamstring, knee to chest , abdominal isometric, bridge , standing 3 way hip   Consulted and Agree with Plan of Care Patient      Patient will benefit from skilled therapeutic intervention in order to improve the following deficits and impairments:  Decreased range of motion, Difficulty walking, Increased fascial restricitons, Obesity, Impaired UE functional use, Pain, Decreased activity tolerance, Decreased balance, Impaired flexibility, Hypomobility, Postural dysfunction, Impaired sensation, Decreased strength, Decreased mobility  Visit Diagnosis: Chronic low back pain with sciatica, sciatica laterality unspecified, unspecified back pain laterality  Difficulty in walking, not elsewhere classified  Muscle weakness (generalized)  Radiculopathy, lumbosacral region     Problem List Patient Active Problem List   Diagnosis Date Noted  . Spondylolisthesis at L1-L2 level 03/18/2015  . Spondylosis with myelopathy 09/02/2014  . Cervical spondylosis with myelopathy 09/02/2014  . Anxiety 12/21/2012  . Chronic back pain 12/21/2012  . Insomnia 12/21/2012  . 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 Spinal stenosis, lumbar region, with neurogenic claudication 10/26/2012  . Spinal stenosis, thoracic 10/26/2012  . Umbilical hernia 15/17/6160  . OSA (obstructive sleep apnea) 11/12/2011  . Exogenous obesity 11/12/2011  . 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 Hypothyroidism 09/18/2009  . Diabetes mellitus without complication ([place]) 73/71/0626  . Dyslipidemia 09/18/2009  . Anemia 09/18/2009  . Depression 09/18/2009  . Essential hypertension 09/18/2009  . GERD 09/18/2009  . Osteoarthritis 09/18/2009    Dorene Ar, PTA 03/26/2016, 9:28 AM  [facility] [address] [place], [place], [address] Phone: [phone]   Fax:  [phone]  Name: Katie Booker MRN: [id number] Date of Birth: Feb 09, 1946

## 2016-03-28 ENCOUNTER — Other Ambulatory Visit: Payer: Medicare Other

## 2016-03-30 ENCOUNTER — Ambulatory Visit: Payer: Medicare Other | Admitting: Physical Therapy

## 2016-03-30 DIAGNOSIS — M5417 Radiculopathy, lumbosacral region: Secondary | ICD-10-CM | POA: Diagnosis not present

## 2016-03-30 DIAGNOSIS — M6281 Muscle weakness (generalized): Secondary | ICD-10-CM

## 2016-03-30 DIAGNOSIS — M5440 Lumbago with sciatica, unspecified side: Secondary | ICD-10-CM | POA: Diagnosis not present

## 2016-03-30 DIAGNOSIS — R262 Difficulty in walking, not elsewhere classified: Secondary | ICD-10-CM

## 2016-03-30 DIAGNOSIS — G8929 Other chronic pain: Secondary | ICD-10-CM

## 2016-03-30 NOTE — Therapy (Signed)
[place], [place], [address] Phone: [phone]   Fax:  [phone]  Physical Therapy Treatment  Patient Details  Name: Katie Booker MRN: [id number] Date of Birth: [birth year] Referring Provider: Dr. Ellene Route   Encounter Date: 03/30/2016      PT End of Session - 03/30/16 1927    Visit Number 4   Number of Visits 16   Date for PT Re-Evaluation 05/04/16   PT Start Time 1330   PT Stop Time 1434   PT Time Calculation (min) 64 min   Activity Tolerance Patient tolerated treatment well   Behavior During Therapy [facility] for tasks assessed/performed      Past Medical History:  Diagnosis Date  . Anal fissure   . ANEMIA-NOS 09/18/2009   takes Ferrous Sulfate daily  . Anxiety    takes Xanax prn sleep and anxiety  . Cancer ([place])    basal cell left cheek  . Chronic back pain    scoliosis/stenosis/spondylosis  . DEPRESSION 09/18/2009   takes Celexa daily  . DIABETES MELLITUS, TYPE II 09/18/2009   takes Amaryl daily and Metformin  . GERD 09/18/2009   takes Omeprazole daily  . Headache(784.0)    sinus HA  . Hemorrhoids   . Histoplasmosis    hx of  . History of bronchitis    last time around 1990  . History of colon polyps   . HYPERLIPIDEMIA 09/18/2009   takes Lipitor daily  . HYPERTENSION 09/18/2009   takes Atenolol daily and Lisinopril  . HYPOTHYROIDISM 09/18/2009  . Insomnia    takes trazodone and ambien nightly  . Joint pain   . Joint swelling   . Morbid obesity ([place])   . Obesity   . OSA (obstructive sleep apnea)    has a CPAP and sleep study report in epic from 2009  . OSTEOARTHRITIS 09/18/2009  . Peripheral neuropathy ([facility])    takes Lyrica daily  . Shortness of breath    with exertion  . Skin spots, red    right above both elbows;pt states always there  . Spondylolisthesis at L1-L2 level   . Umbilical hernia   . Urinary frequency   . Urinary urgency     Past Surgical History:  Procedure  Laterality Date  . ANTERIOR CERVICAL DECOMP/DISCECTOMY FUSION N/A 09/02/2014   Procedure: Cervical five- six  Anterior cervical decompression fusion;  Surgeon: Kristeen Miss, MD;  Location: [facility];  Service: Neurosurgery;  Laterality: N/A;  C5-6 Anterior cervical decompression/diskectomy/fusion  . BACK SURGERY    . CARPAL TUNNEL RELEASE     RIGHT      2016   . COLONOSCOPY  2007  . ESOPHAGOGASTRODUODENOSCOPY  2007  . HEMORRHOID SURGERY  03/23/2011   Procedure: HEMORRHOIDECTOMY;  Surgeon: Joyice Faster. Cornett, MD;  Location: [facility];  Service: General;  Laterality: N/A;  lateral internal sphincterotomy and hemorrhoidectomy  . SPHINCTEROTOMY    . THORACIC DISCECTOMY N/A 12/15/2012   Procedure: Thoracic ten-eleven Thoracic laminectomy;  Surgeon: Kristeen Miss, MD;  Location: [facility];  Service: Neurosurgery;  Laterality: N/A;  Thoracic ten-eleven Thoracic laminectomy  . tubal fulgaration  1991    There were no vitals filed for this visit.      Subjective Assessment - 03/30/16 1337    Subjective I am afraid I have made my back worse.  I am trying to do the exercises.  I almost didnt come. Demonstrates hip extension as the possible culprit.  Took  a pain pill at 10:30 and then another one at 1:00.     Pertinent History L1-L5 fusion (2 separate 11/2012, 02/2015) Thoracic laminectomy, C5-C6 ACDF (2016) , L1-L2 PLIF (02/2015) L knee OA with cortisone Oct. 2017   Currently in Pain? Yes   Pain Score 8    Pain Location Back   Pain Orientation Right;Lower   Pain Descriptors / Indicators Stabbing;Aching   Pain Type Chronic pain   Pain Onset More than a month ago   Pain Frequency Intermittent   Aggravating Factors  standing, walking, rolling, transfers from mat    Pain Relieving Factors sitting, not sure anymore- meds                [facility] Adult PT Treatment/Exercise - 03/30/16 1347      Self-Care   Self-Care Other Self-Care Comments   Other Self-Care Comments  how to  modify exercies HEP to minimize pain      Lumbar Exercises: Stretches   Active Hamstring Stretch 2 reps;30 seconds   Active Hamstring Stretch Limitations pain in L without hinging    Single Knee to Chest Stretch 2 reps;30 seconds   Single Knee to Chest Stretch Limitations sitting      Lumbar Exercises: Standing   Other Standing Lumbar Exercises standing abdominals isometric core contraction   x 10 cues for breathing     Lumbar Exercises: Seated   Other Seated Lumbar Exercises Seated pelvic rocking, abdominal bracing     Cryotherapy   Number Minutes Cryotherapy 10 Minutes   Cryotherapy Location Lumbar Spine   Type of Cryotherapy Ice pack     Electrical Stimulation   Electrical Stimulation Location lumbar    Electrical Stimulation Action IFC   Electrical Stimulation Parameters to tol    Electrical Stimulation Goals Pain     Manual Therapy   Manual therapy comments knotted with scar tissue    Soft tissue mobilization Rt. QL, lumbar paraspinals, superior gluteals (med)   skin and fascia very tight, improved after manual work   Myofascial Release Rt. pelvis in sidelying                 PT Education - 03/30/16 1926    Education provided Yes   Education Details modifications for HEP, core   Person(s) Educated Patient   Methods Explanation;Demonstration   Comprehension Verbalized understanding;Need further instruction          PT Short Term Goals - 03/30/16 1930      PT SHORT TERM GOAL #1   Title Pt will be complete balance assessment and set goal.     Status Achieved     PT SHORT TERM GOAL #2   Title Pt will be I with HEP    Status On-going     PT SHORT TERM GOAL #3   Title Pt will be able to report min pain in low back with transfers, rolling over in bed or on therapy mat.    Status On-going     PT SHORT TERM GOAL #4   Title Pt will be able to walk in grocery store with cart and report min fatigue, pain overall.    Status On-going           PT  Long Term Goals - 03/30/16 1930      PT LONG TERM GOAL #1   Title Pt will be able to understand good body mechanics, posture and utilize at home for long term pain control.    Status On-going  PT LONG TERM GOAL #2   Title Pt will be able to use 3 self care strategies for home use (other than pain meds) to reduce pain.    Status Unable to assess     PT LONG TERM GOAL #3   Title Pt will increase hip strength to 4+/5 in abduction, extension to improve tolerance for gait with LRAD.    Status On-going     PT LONG TERM GOAL #4   Title FOTO score will improve to less than 40% impaired.    Status On-going     PT LONG TERM GOAL #5   Title Pt will be able to complete ADLs, light housework for up to 15 min in standing with no increase in back pain.    Status On-going               Plan - 03/30/16 1927    Clinical Impression Statement Patient with difficulty doing exercises.  Showed her modifications for standing hip ext and hamstring, single knee to chest. Worked on engaging abdominals in standing.  Trial of soft tissue to Rt. low back, needed to sit for IFC.  No goals met, gave encouragement.    PT Next Visit Plan narrow balance, and reaching outside BOS if pain controlled.  Review standing hip, supine trunk stretches as tolerated, core in various positions, monitor vitals    PT Home Exercise Plan basic back: LTR, hamstring, knee to chest , abdominal isometric, DC bridge for now , standing 3 way hip   Consulted and Agree with Plan of Care Patient      Patient will benefit from skilled therapeutic intervention in order to improve the following deficits and impairments:  Decreased range of motion, Difficulty walking, Increased fascial restricitons, Obesity, Impaired UE functional use, Pain, Decreased activity tolerance, Decreased balance, Impaired flexibility, Hypomobility, Postural dysfunction, Impaired sensation, Decreased strength, Decreased mobility  Visit Diagnosis: Chronic low  back pain with sciatica, sciatica laterality unspecified, unspecified back pain laterality  Difficulty in walking, not elsewhere classified  Muscle weakness (generalized)  Radiculopathy, lumbosacral region     Problem List Patient Active Problem List   Diagnosis Date Noted  . Spondylolisthesis at L1-L2 level 03/18/2015  . Spondylosis with myelopathy 09/02/2014  . Cervical spondylosis with myelopathy 09/02/2014  . Anxiety 12/21/2012  . Chronic back pain 12/21/2012  . Insomnia 12/21/2012  . 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 Spinal stenosis, lumbar region, with neurogenic claudication 10/26/2012  . Spinal stenosis, thoracic 10/26/2012  . Umbilical hernia 61/22/4497  . OSA (obstructive sleep apnea) 11/12/2011  . Exogenous obesity 11/12/2011  . 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 Hypothyroidism 09/18/2009  . Diabetes mellitus without complication ([place]) 53/00/5110  . Dyslipidemia 09/18/2009  . Anemia 09/18/2009  . Depression 09/18/2009  . Essential hypertension 09/18/2009  . GERD 09/18/2009  . Osteoarthritis 09/18/2009    Rukiya Hodgkins 03/30/2016, 7:34 PM  [facility] [address] [place], [place], [address] Phone: [phone]   Fax:  [phone]  Name: Katie Booker MRN: [id number] Date of Birth: Jan 26, 1946  Raeford Razor, PT 03/30/16 7:35 PM Phone: [phone] Fax: [phone]

## 2016-04-01 ENCOUNTER — Ambulatory Visit: Payer: Medicare Other | Admitting: Physical Therapy

## 2016-04-01 VITALS — BP 134/74 | HR 81

## 2016-04-01 DIAGNOSIS — M5440 Lumbago with sciatica, unspecified side: Principal | ICD-10-CM

## 2016-04-01 DIAGNOSIS — M6281 Muscle weakness (generalized): Secondary | ICD-10-CM | POA: Diagnosis not present

## 2016-04-01 DIAGNOSIS — M5417 Radiculopathy, lumbosacral region: Secondary | ICD-10-CM | POA: Diagnosis not present

## 2016-04-01 DIAGNOSIS — G8929 Other chronic pain: Secondary | ICD-10-CM

## 2016-04-01 DIAGNOSIS — R262 Difficulty in walking, not elsewhere classified: Secondary | ICD-10-CM

## 2016-04-01 NOTE — Therapy (Signed)
[place], [place], [address] Phone: [phone]   Fax:  [phone]  Physical Therapy Treatment  Patient Details  Name: Katie Booker MRN: [id number] Date of Birth: 1945/03/30 Referring Provider: Dr. Ellene Route   Encounter Date: 04/01/2016      PT End of Session - 04/01/16 1537    Visit Number 5   Number of Visits 16   Date for PT Re-Evaluation 05/04/16   PT Start Time 1330   PT Stop Time 1435   PT Time Calculation (min) 65 min   Activity Tolerance Patient tolerated treatment well;Patient limited by pain   Behavior During Therapy [facility] for tasks assessed/performed      Past Medical History:  Diagnosis Date  . Anal fissure   . ANEMIA-NOS 09/18/2009   takes Ferrous Sulfate daily  . Anxiety    takes Xanax prn sleep and anxiety  . Cancer ([place])    basal cell left cheek  . Chronic back pain    scoliosis/stenosis/spondylosis  . DEPRESSION 09/18/2009   takes Celexa daily  . DIABETES MELLITUS, TYPE II 09/18/2009   takes Amaryl daily and Metformin  . GERD 09/18/2009   takes Omeprazole daily  . Headache(784.0)    sinus HA  . Hemorrhoids   . Histoplasmosis    hx of  . History of bronchitis    last time around 1990  . History of colon polyps   . HYPERLIPIDEMIA 09/18/2009   takes Lipitor daily  . HYPERTENSION 09/18/2009   takes Atenolol daily and Lisinopril  . HYPOTHYROIDISM 09/18/2009  . Insomnia    takes trazodone and ambien nightly  . Joint pain   . Joint swelling   . Morbid obesity ([place])   . Obesity   . OSA (obstructive sleep apnea)    has a CPAP and sleep study report in epic from 2009  . OSTEOARTHRITIS 09/18/2009  . Peripheral neuropathy ([facility])    takes Lyrica daily  . Shortness of breath    with exertion  . Skin spots, red    right above both elbows;pt states always there  . Spondylolisthesis at L1-L2 level   . Umbilical hernia   . Urinary frequency   . Urinary urgency     Past Surgical  History:  Procedure Laterality Date  . ANTERIOR CERVICAL DECOMP/DISCECTOMY FUSION N/A 09/02/2014   Procedure: Cervical five- six  Anterior cervical decompression fusion;  Surgeon: Kristeen Miss, MD;  Location: [place] [facility];  Service: Neurosurgery;  Laterality: N/A;  C5-6 Anterior cervical decompression/diskectomy/fusion  . BACK SURGERY    . CARPAL TUNNEL RELEASE     RIGHT      2016   . COLONOSCOPY  2007  . ESOPHAGOGASTRODUODENOSCOPY  2007  . HEMORRHOID SURGERY  03/23/2011   Procedure: HEMORRHOIDECTOMY;  Surgeon: Joyice Faster. Cornett, MD;  Location: [place];  Service: General;  Laterality: N/A;  lateral internal sphincterotomy and hemorrhoidectomy  . SPHINCTEROTOMY    . THORACIC DISCECTOMY N/A 12/15/2012   Procedure: Thoracic ten-eleven Thoracic laminectomy;  Surgeon: Kristeen Miss, MD;  Location: [place] [facility];  Service: Neurosurgery;  Laterality: N/A;  Thoracic ten-eleven Thoracic laminectomy  . tubal fulgaration  1991    Vitals:   04/01/16 1331  BP: 134/74  Pulse: 81        Subjective Assessment - 04/01/16 1331    Subjective I havent been sleeping well, restless legs and some other leg pain/soreness.  When I walked out last time I felt so good.  Currently in Pain? Yes   Pain Score 3    Pain Location Back   Pain Orientation Right;Lower   Pain Descriptors / Indicators Aching   Pain Type Chronic pain   Pain Onset More than a month ago   Pain Frequency Intermittent                         [facility] Adult PT Treatment/Exercise - 04/01/16 1340      Lumbar Exercises: Stretches   Active Hamstring Stretch 3 reps;30 seconds   Lower Trunk Rotation 10 seconds   Lower Trunk Rotation Limitations x 10      Lumbar Exercises: Standing   Other Standing Lumbar Exercises calf stretch x 3 x 30 sec each leg the wall      Lumbar Exercises: Supine   Ab Set 10 reps   AB Set Limitations ball squeeze    Glut Set 10 reps   Clam 10 reps   Bent Knee Raise 10 reps    Straight Leg Raise 10 reps   Straight Leg Raises Limitations unable to finish, did 7-8      Electrical Stimulation   Electrical Stimulation Location lumbar    Electrical Stimulation Action IFC    Electrical Stimulation Parameters 19   Electrical Stimulation Goals Pain     Manual Therapy   Soft tissue mobilization bilateral mid to low lumbar and Rt. glute                 PT Education - 04/01/16 1536    Education provided Yes   Education Details gastroc stretch, core    Person(s) Educated Patient   Methods Explanation;Handout   Comprehension Verbalized understanding          PT Short Term Goals - 03/30/16 1930      PT SHORT TERM GOAL #1   Title Pt will be complete balance assessment and set goal.     Status Achieved     PT SHORT TERM GOAL #2   Title Pt will be I with HEP    Status On-going     PT SHORT TERM GOAL #3   Title Pt will be able to report min pain in low back with transfers, rolling over in bed or on therapy mat.    Status On-going     PT SHORT TERM GOAL #4   Title Pt will be able to walk in grocery store with cart and report min fatigue, pain overall.    Status On-going           PT Long Term Goals - 03/30/16 1930      PT LONG TERM GOAL #1   Title Pt will be able to understand good body mechanics, posture and utilize at home for long term pain control.    Status On-going     PT LONG TERM GOAL #2   Title Pt will be able to use 3 self care strategies for home use (other than pain meds) to reduce pain.    Status Unable to assess     PT LONG TERM GOAL #3   Title Pt will increase hip strength to 4+/5 in abduction, extension to improve tolerance for gait with LRAD.    Status On-going     PT LONG TERM GOAL #4   Title FOTO score will improve to less than 40% impaired.    Status On-going     PT LONG TERM GOAL #5   Title Pt will  be able to complete ADLs, light housework for up to 15 min in standing with no increase in back pain.    Status  On-going               Plan - 04/01/16 1537    Clinical Impression Statement Pt able to exercise in hooklying with wedge.  Vital signs stable but easily winded and legs "felt heavy" with exercises.  Min to mod increase in low back pain with simple activities.  Stretches hamstrings but feels tighter in calf .  No further goals met.  Provided emotional support as patient describes pain and insomnia.    PT Next Visit Plan narrow balance, and reaching outside BOS if pain controlled.  Review standing hip, supine trunk stretches as tolerated, core in various positions, monitor vitals    PT Home Exercise Plan basic back: LTR, hamstring, knee to chest , abdominal isometric, DC bridge for now , standing 3 way hip, calf stretch    Consulted and Agree with Plan of Care Patient      Patient will benefit from skilled therapeutic intervention in order to improve the following deficits and impairments:  Decreased range of motion, Difficulty walking, Increased fascial restricitons, Obesity, Impaired UE functional use, Pain, Decreased activity tolerance, Decreased balance, Impaired flexibility, Hypomobility, Postural dysfunction, Impaired sensation, Decreased strength, Decreased mobility  Visit Diagnosis: Chronic low back pain with sciatica, sciatica laterality unspecified, unspecified back pain laterality  Difficulty in walking, not elsewhere classified  Muscle weakness (generalized)  Radiculopathy, lumbosacral region     Problem List Patient Active Problem List   Diagnosis Date Noted  . Spondylolisthesis at L1-L2 level 03/18/2015  . Spondylosis with myelopathy 09/02/2014  . Cervical spondylosis with myelopathy 09/02/2014  . Anxiety 12/21/2012  . Chronic back pain 12/21/2012  . Insomnia 12/21/2012  . 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 Spinal stenosis, lumbar region, with neurogenic claudication 10/26/2012  . Spinal stenosis, thoracic 10/26/2012  . Umbilical  hernia 16/11/9602  . OSA (obstructive sleep apnea) 11/12/2011  . Exogenous obesity 11/12/2011  . 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 Hypothyroidism 09/18/2009  . Diabetes mellitus without complication ([place]) 54/10/8117  . Dyslipidemia 09/18/2009  . Anemia 09/18/2009  . Depression 09/18/2009  . Essential hypertension 09/18/2009  . GERD 09/18/2009  . Osteoarthritis 09/18/2009    PAA,JENNIFER 04/01/2016, 3:44 PM  [facility] [address] [place], [place], [address] Phone: [phone]   Fax:  [phone]  Name: Katie Booker MRN: [id number] Date of Birth: [birth year]  Raeford Razor, PT 04/01/16 3:44 PM Phone: [phone] Fax: [phone]

## 2016-04-01 NOTE — Patient Instructions (Signed)
Achilles / Gastroc, Standing    Stand, right foot behind, heel on floor and turned slightly out, leg straight, forward leg bent. Move hips forward. Hold 30 seconds. Repeat 3  times per session. Do 2 sessions per day.  Copyright  VHI. All rights reserved.   

## 2016-04-06 ENCOUNTER — Ambulatory Visit: Payer: Medicare Other | Admitting: Physical Therapy

## 2016-04-06 DIAGNOSIS — R262 Difficulty in walking, not elsewhere classified: Secondary | ICD-10-CM

## 2016-04-06 DIAGNOSIS — M6281 Muscle weakness (generalized): Secondary | ICD-10-CM | POA: Diagnosis not present

## 2016-04-06 DIAGNOSIS — M5440 Lumbago with sciatica, unspecified side: Secondary | ICD-10-CM | POA: Diagnosis not present

## 2016-04-06 DIAGNOSIS — G8929 Other chronic pain: Secondary | ICD-10-CM | POA: Diagnosis not present

## 2016-04-06 DIAGNOSIS — M5417 Radiculopathy, lumbosacral region: Secondary | ICD-10-CM | POA: Diagnosis not present

## 2016-04-06 NOTE — Therapy (Signed)
[facility] [address] [place], [place], [address] Phone: [phone]   Fax:  [phone]  Physical Therapy Treatment  Patient Details  Name: Katie [name] MRN: [id number] Date of Birth: 1945-08-21 Referring Provider: Dr. Danielle DessElsner   Encounter Date: 04/06/2016      PT End of Session - 04/06/16 1336    Visit Number 6   Number of Visits 16   Date for PT Re-Evaluation 05/04/16   PT Start Time 0130   PT Stop Time 0230   PT Time Calculation (min) 60 min      Past Medical History:  Diagnosis Date  . Anal fissure   . ANEMIA-NOS 09/18/2009   takes Ferrous Sulfate daily  . Anxiety    takes Xanax prn sleep and anxiety  . Cancer ([facility])    basal cell left cheek  . Chronic back pain    scoliosis/stenosis/spondylosis  . DEPRESSION 09/18/2009   takes Celexa daily  . DIABETES MELLITUS, TYPE II 09/18/2009   takes Amaryl daily and Metformin  . GERD 09/18/2009   takes Omeprazole daily  . Headache(784.0)    sinus HA  . Hemorrhoids   . Histoplasmosis    hx of  . History of bronchitis    last time around 1990  . History of colon polyps   . HYPERLIPIDEMIA 09/18/2009   takes Lipitor daily  . HYPERTENSION 09/18/2009   takes Atenolol daily and Lisinopril  . HYPOTHYROIDISM 09/18/2009  . Insomnia    takes trazodone and ambien nightly  . Joint pain   . Joint swelling   . Morbid obesity ([facility])   . Obesity   . OSA (obstructive sleep apnea)    has a CPAP and sleep study report in epic from 2009  . OSTEOARTHRITIS 09/18/2009  . Peripheral neuropathy ([facility])    takes Lyrica daily  . Shortness of breath    with exertion  . Skin spots, red    right above both elbows;pt states always there  . Spondylolisthesis at L1-L2 level   . Umbilical hernia   . Urinary frequency   . Urinary urgency     Past Surgical History:  Procedure Laterality Date  . ANTERIOR CERVICAL DECOMP/DISCECTOMY FUSION N/A 09/02/2014   Procedure: Cervical five- six   Anterior cervical decompression fusion;  Surgeon: Barnett AbuHenry Elsner, MD;  Location: [facility];  Service: Neurosurgery;  Laterality: N/A;  C5-6 Anterior cervical decompression/diskectomy/fusion  . BACK SURGERY    . CARPAL TUNNEL RELEASE     RIGHT      2016   . COLONOSCOPY  2007  . ESOPHAGOGASTRODUODENOSCOPY  2007  . HEMORRHOID SURGERY  03/23/2011   Procedure: HEMORRHOIDECTOMY;  Surgeon: Clovis Puhomas A. Cornett, MD;  Location: [facility];  Service: General;  Laterality: N/A;  lateral internal sphincterotomy and hemorrhoidectomy  . SPHINCTEROTOMY    . THORACIC DISCECTOMY N/A 12/15/2012   Procedure: Thoracic ten-eleven Thoracic laminectomy;  Surgeon: Barnett AbuHenry Elsner, MD;  Location: [facility];  Service: Neurosurgery;  Laterality: N/A;  Thoracic ten-eleven Thoracic laminectomy  . tubal fulgaration  1991    There were no vitals filed for this visit.      Subjective Assessment - 04/06/16 1334    Subjective I have not slept well the last month.    Currently in Pain? Yes   Pain Score 3    Pain Location Back   Pain Orientation Right;Lower   Pain Descriptors / Indicators Aching;Tightness;Sore   Aggravating Factors  walking, twist the wrong way  Pain Relieving Factors sitting                          [facility] Adult PT Treatment/Exercise - 04/06/16 0001      Lumbar Exercises: Stretches   Active Hamstring Stretch 3 reps;30 seconds  c/o pain afterward    Active Hamstring Stretch Limitations seated on edge of mat    Lower Trunk Rotation 10 seconds   Lower Trunk Rotation Limitations x 10      Lumbar Exercises: Standing   Other Standing Lumbar Exercises Standing 3 way hip x 10 each bilateral, repeated series x 1      Lumbar Exercises: Supine   Ab Set 10 reps   Glut Set 10 reps   Clam 15 reps   Clam Limitations red band    Straight Leg Raise 10 reps   Straight Leg Raises Limitations able to complete 16 reps  on right  and 2 sets of 10 on left      Moist Heat Therapy    Number Minutes Moist Heat 10 Minutes   Moist Heat Location Lumbar Spine     Electrical Stimulation   Electrical Stimulation Location lumbar    Electrical Stimulation Action IFC   Electrical Stimulation Parameters 17ma   Electrical Stimulation Goals Pain                  PT Short Term Goals - 04/06/16 1444      PT SHORT TERM GOAL #1   Title Pt will be complete balance assessment and set goal.     Baseline 50/56   Time 4   Period Weeks   Status Achieved     PT SHORT TERM GOAL #2   Title Pt will be I with HEP    Baseline doing a couple of exercises   Time 4   Period Weeks   Status On-going     PT SHORT TERM GOAL #3   Title Pt will be able to report min pain in low back with transfers, rolling over in bed or on therapy mat.    Baseline able to transfer on and off mat, supine to sit without c/o pain however SOB is most bothersome    Time 4   Period Weeks   Status On-going     PT SHORT TERM GOAL #4   Title Pt will be able to walk in grocery store with cart and report min fatigue, pain overall.    Time 4   Period Weeks   Status Unable to assess           PT Long Term Goals - 03/30/16 1930      PT LONG TERM GOAL #1   Title Pt will be able to understand good body mechanics, posture and utilize at home for long term pain control.    Status On-going     PT LONG TERM GOAL #2   Title Pt will be able to use 3 self care strategies for home use (other than pain meds) to reduce pain.    Status Unable to assess     PT LONG TERM GOAL #3   Title Pt will increase hip strength to 4+/5 in abduction, extension to improve tolerance for gait with LRAD.    Status On-going     PT LONG TERM GOAL #4   Title FOTO score will improve to less than 40% impaired.    Status On-going     PT LONG  TERM GOAL #5   Title Pt will be able to complete ADLs, light housework for up to 15 min in standing with no increase in back pain.    Status On-going               Plan -  04/06/16 1347    Clinical Impression Statement Pt is concerned with SOB symptoms. Vitals stable. She plans to speak to her MD regarding symptoms. She reports increased anxiety when talking about her SOB. Pt reports thighs and calves feel stronger with sit-stand transfers. She denies improvement in standing tolerance. She demonstrates increased tolerance to exericses.    PT Next Visit Plan narrow balance, and reaching outside BOS if pain controlled.  Review standing hip, supine trunk stretches as tolerated, core in various positions, monitor vitals    PT Home Exercise Plan basic back: LTR, hamstring, knee to chest , abdominal isometric, DC bridge for now , standing 3 way hip, calf stretch    Consulted and Agree with Plan of Care Patient      Patient will benefit from skilled therapeutic intervention in order to improve the following deficits and impairments:  Decreased range of motion, Difficulty walking, Increased fascial restricitons, Obesity, Impaired UE functional use, Pain, Decreased activity tolerance, Decreased balance, Impaired flexibility, Hypomobility, Postural dysfunction, Impaired sensation, Decreased strength, Decreased mobility  Visit Diagnosis: Chronic low back pain with sciatica, sciatica laterality unspecified, unspecified back pain laterality  Difficulty in walking, not elsewhere classified  Muscle weakness (generalized)  Radiculopathy, lumbosacral region     Problem List Patient Active Problem List   Diagnosis Date Noted  . Spondylolisthesis at L1-L2 level 03/18/2015  . Spondylosis with myelopathy 09/02/2014  . Cervical spondylosis with myelopathy 09/02/2014  . Anxiety 12/21/2012  . Chronic back pain 12/21/2012  . Insomnia 12/21/2012  . 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 Spinal stenosis, lumbar region, with neurogenic claudication 10/26/2012  . Spinal stenosis, thoracic 10/26/2012  . Umbilical hernia 02/25/2012  . OSA (obstructive  sleep apnea) 11/12/2011  . Exogenous obesity 11/12/2011  . 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 Hypothyroidism 09/18/2009  . Diabetes mellitus without complication ([facility]) 09/18/2009  . Dyslipidemia 09/18/2009  . Anemia 09/18/2009  . Depression 09/18/2009  . Essential hypertension 09/18/2009  . GERD 09/18/2009  . Osteoarthritis 09/18/2009    Sherrie Mustache, PTA 04/06/2016, 2:46 PM  [facility] [address] [place], [place], [address] Phone: [phone]   Fax:  [phone]  Name: Katie Booker MRN: [id number] Date of Birth: Aug 31, 1945

## 2016-04-07 ENCOUNTER — Other Ambulatory Visit: Payer: Self-pay | Admitting: Internal Medicine

## 2016-04-07 MED ORDER — PREGABALIN 75 MG PO CAPS: 75.0000 mg | ORAL_CAPSULE | Freq: Every day | ORAL | 1 refills | Status: DC

## 2016-04-07 MED ORDER — ALPRAZOLAM 0.5 MG PO TABS: 0.5000 mg | ORAL_TABLET | Freq: Three times a day (TID) | ORAL | 5 refills | Status: DC

## 2016-04-08 ENCOUNTER — Ambulatory Visit: Payer: Medicare Other | Admitting: Physical Therapy

## 2016-04-09 ENCOUNTER — Ambulatory Visit
Admission: RE | Admit: 2016-04-09 | Discharge: 2016-04-09 | Disposition: A | Discharge status: Home / Self Care | Payer: Medicare Other | Source: Ambulatory Visit | Attending: Neurological Surgery | Admitting: Neurological Surgery

## 2016-04-09 ENCOUNTER — Other Ambulatory Visit: Payer: Self-pay | Admitting: Neurological Surgery

## 2016-04-09 DIAGNOSIS — M48062 Spinal stenosis, lumbar region with neurogenic claudication: Secondary | ICD-10-CM

## 2016-04-09 DIAGNOSIS — M48061 Spinal stenosis, lumbar region without neurogenic claudication: Secondary | ICD-10-CM | POA: Diagnosis not present

## 2016-04-12 ENCOUNTER — Telehealth: Payer: Self-pay | Admitting: Internal Medicine

## 2016-04-12 ENCOUNTER — Other Ambulatory Visit: Payer: Self-pay | Admitting: Internal Medicine

## 2016-04-12 MED ORDER — HYDROCODONE-ACETAMINOPHEN 5-325 MG PO TABS: ORAL_TABLET | ORAL | 0 refills | Status: DC

## 2016-04-12 NOTE — Telephone Encounter (Signed)
Pt need new Rx for Hydrocodone  Pt is aware of 3 business day for refill

## 2016-04-12 NOTE — Telephone Encounter (Signed)
Rx ready for pickup. Rx printed and signed. A detailed message was left on pt voicemail that Rx is ready for pick up at the front desk.

## 2016-04-13 ENCOUNTER — Ambulatory Visit: Payer: Medicare Other | Admitting: Physical Therapy

## 2016-04-13 DIAGNOSIS — M5440 Lumbago with sciatica, unspecified side: Secondary | ICD-10-CM | POA: Diagnosis not present

## 2016-04-13 DIAGNOSIS — R262 Difficulty in walking, not elsewhere classified: Secondary | ICD-10-CM

## 2016-04-13 DIAGNOSIS — G8929 Other chronic pain: Secondary | ICD-10-CM | POA: Diagnosis not present

## 2016-04-13 DIAGNOSIS — M5417 Radiculopathy, lumbosacral region: Secondary | ICD-10-CM

## 2016-04-13 DIAGNOSIS — M6281 Muscle weakness (generalized): Secondary | ICD-10-CM | POA: Diagnosis not present

## 2016-04-13 NOTE — Therapy (Signed)
[facility] [address] [place], [place], [address] Phone: [phone]   Fax:  [phone]  Physical Therapy Treatment  Patient Details  Name: Katie [name] MRN: [id number] Date of Birth: [birth year] Referring Provider: Dr. Danielle DessElsner   Encounter Date: 04/13/2016      PT End of Session - 04/13/16 1340    Visit Number 7   Number of Visits 16   Date for PT Re-Evaluation 05/04/16   PT Start Time 1330   PT Stop Time 1435   PT Time Calculation (min) 65 min   Activity Tolerance Patient tolerated treatment well;Patient limited by fatigue   Behavior During Therapy [facility] for tasks assessed/performed      Past Medical History:  Diagnosis Date  . Anal fissure   . ANEMIA-NOS 09/18/2009   takes Ferrous Sulfate daily  . Anxiety    takes Xanax prn sleep and anxiety  . Cancer (HCC)    basal cell left cheek  . Chronic back pain    scoliosis/stenosis/spondylosis  . DEPRESSION 09/18/2009   takes Celexa daily  . DIABETES MELLITUS, TYPE II 09/18/2009   takes Amaryl daily and Metformin  . GERD 09/18/2009   takes Omeprazole daily  . Headache(784.0)    sinus HA  . Hemorrhoids   . Histoplasmosis    hx of  . History of bronchitis    last time around 1990  . History of colon polyps   . HYPERLIPIDEMIA 09/18/2009   takes Lipitor daily  . HYPERTENSION 09/18/2009   takes Atenolol daily and Lisinopril  . HYPOTHYROIDISM 09/18/2009  . Insomnia    takes trazodone and ambien nightly  . Joint pain   . Joint swelling   . Morbid obesity (HCC)   . Obesity   . OSA (obstructive sleep apnea)    has a CPAP and sleep study report in epic from 2009  . OSTEOARTHRITIS 09/18/2009  . Peripheral neuropathy (HCC)    takes Lyrica daily  . Shortness of breath    with exertion  . Skin spots, red    right above both elbows;pt states always there  . Spondylolisthesis at L1-L2 level   . Umbilical hernia   . Urinary frequency   . Urinary urgency     Past  Surgical History:  Procedure Laterality Date  . ANTERIOR CERVICAL DECOMP/DISCECTOMY FUSION N/A 09/02/2014   Procedure: Cervical five- six  Anterior cervical decompression fusion;  Surgeon: Barnett AbuHenry Elsner, MD;  Location: [facility];  Service: Neurosurgery;  Laterality: N/A;  C5-6 Anterior cervical decompression/diskectomy/fusion  . BACK SURGERY    . CARPAL TUNNEL RELEASE     RIGHT      2016   . COLONOSCOPY  2007  . ESOPHAGOGASTRODUODENOSCOPY  2007  . HEMORRHOID SURGERY  03/23/2011   Procedure: HEMORRHOIDECTOMY;  Surgeon: Clovis Puhomas A. Cornett, MD;  Location: [facility];  Service: General;  Laterality: N/A;  lateral internal sphincterotomy and hemorrhoidectomy  . SPHINCTEROTOMY    . THORACIC DISCECTOMY N/A 12/15/2012   Procedure: Thoracic ten-eleven Thoracic laminectomy;  Surgeon: Barnett AbuHenry Elsner, MD;  Location: [facility];  Service: Neurosurgery;  Laterality: N/A;  Thoracic ten-eleven Thoracic laminectomy  . tubal fulgaration  1991    There were no vitals filed for this visit.      Subjective Assessment - 04/13/16 1334    Subjective I was not able to complete the MRI, only did 1/2 of it due to mental panic.  Did not have the contrast, she was unable to go  back in.  Sees Dr. Danielle Dess Thursday. No pain now and I have been sleeping better.     Currently in Pain? No/denies   Pain Score 0-No pain   Pain Location Back   Pain Orientation Right;Lower   Pain Descriptors / Indicators Aching   Pain Type Chronic pain   Pain Onset More than a month ago   Aggravating Factors  overactivity    Pain Relieving Factors sitting, rest             [facility] Adult PT Treatment/Exercise - 04/13/16 1343      Lumbar Exercises: Stretches   Active Hamstring Stretch 2 reps;30 seconds  c/o pain afterward    Active Hamstring Stretch Limitations seated on edge of mat    Lower Trunk Rotation 10 seconds   Lower Trunk Rotation Limitations x 10      Lumbar Exercises: Standing   Heel Raises 20 reps    Row Strengthening;Both;20 reps   Theraband Level (Row) Level 3 (Green)   Shoulder Extension Strengthening;Both;20 reps;Theraband   Theraband Level (Shoulder Extension) Level 2 (Red)   Other Standing Lumbar Exercises hip abduction x 15 used foam pad    Other Standing Lumbar Exercises high march 1 UE support x 10      Lumbar Exercises: Supine   Glut Set 10 reps;5 seconds   Glut Set Limitations legs on bolster    Straight Leg Raise 10 reps   Straight Leg Raises Limitations from bolster      Electrical Stimulation   Electrical Stimulation Location lumbar    Electrical Stimulation Action IFC   Electrical Stimulation Parameters 17    Electrical Stimulation Goals Pain     Manual Therapy   Manual therapy comments gentle long axis stretch to Rt LE to decompress ant hip                   PT Short Term Goals - 04/06/16 1444      PT SHORT TERM GOAL #1   Title Pt will be complete balance assessment and set goal.     Baseline 50/56   Time 4   Period Weeks   Status Achieved     PT SHORT TERM GOAL #2   Title Pt will be I with HEP    Baseline doing a couple of exercises   Time 4   Period Weeks   Status On-going     PT SHORT TERM GOAL #3   Title Pt will be able to report min pain in low back with transfers, rolling over in bed or on therapy mat.    Baseline able to transfer on and off mat, supine to sit without c/o pain however SOB is most bothersome    Time 4   Period Weeks   Status On-going     PT SHORT TERM GOAL #4   Title Pt will be able to walk in grocery store with cart and report min fatigue, pain overall.    Time 4   Period Weeks   Status Unable to assess           PT Long Term Goals - 04/13/16 1428      PT LONG TERM GOAL #1   Title Pt will be able to understand good body mechanics, posture and utilize at home for long term pain control.    Status On-going     PT LONG TERM GOAL #2   Title Pt will be able to use 3 self care strategies for  home use  (other than pain meds) to reduce pain.    Status Unable to assess     PT LONG TERM GOAL #3   Title Pt will increase hip strength to 4+/5 in abduction, extension to improve tolerance for gait with LRAD.    Status On-going     PT LONG TERM GOAL #4   Title FOTO score will improve to less than 40% impaired.    Status Unable to assess     PT LONG TERM GOAL #5   Title Pt will be able to complete ADLs, light housework for up to 15 min in standing with no increase in back pain.    Status On-going               Plan - 04/13/16 1429    Clinical Impression Statement Pt is able to stand for up to 7-8 min at a time for therapeutic intervention before rest break.  Continuing to make progress towards goals.    PT Next Visit Plan narrow balance, and reaching outside BOS if pain controlled.  Review standing hip, supine trunk stretches as tolerated, core in various positions, monitor vitals    PT Home Exercise Plan basic back: LTR, hamstring, knee to chest , abdominal isometric, DC bridge for now , standing 3 way hip, calf stretch    Consulted and Agree with Plan of Care Patient      Patient will benefit from skilled therapeutic intervention in order to improve the following deficits and impairments:  Decreased range of motion, Difficulty walking, Increased fascial restricitons, Obesity, Impaired UE functional use, Pain, Decreased activity tolerance, Decreased balance, Impaired flexibility, Hypomobility, Postural dysfunction, Impaired sensation, Decreased strength, Decreased mobility  Visit Diagnosis: Chronic low back pain with sciatica, sciatica laterality unspecified, unspecified back pain laterality  Difficulty in walking, not elsewhere classified  Muscle weakness (generalized)  Radiculopathy, lumbosacral region     Problem List Patient Active Problem List   Diagnosis Date Noted  . Spondylolisthesis at L1-L2 level 03/18/2015  . Spondylosis with myelopathy 09/02/2014  . Cervical  spondylosis with myelopathy 09/02/2014  . Anxiety 12/21/2012  . Chronic back pain 12/21/2012  . Insomnia 12/21/2012  . 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 Spinal stenosis, lumbar region, with neurogenic claudication 10/26/2012  . Spinal stenosis, thoracic 10/26/2012  . Umbilical hernia 02/25/2012  . OSA (obstructive sleep apnea) 11/12/2011  . Exogenous obesity 11/12/2011  . 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 Hypothyroidism 09/18/2009  . Diabetes mellitus without complication (HCC) 09/18/2009  . Dyslipidemia 09/18/2009  . Anemia 09/18/2009  . Depression 09/18/2009  . Essential hypertension 09/18/2009  . GERD 09/18/2009  . Osteoarthritis 09/18/2009    PAA,JENNIFER 04/13/2016, 2:31 PM  [facility] [address] [place], [place], [address] Phone: [phone]   Fax:  [phone]  Name: Katie Booker MRN: [id number] Date of Birth: 07-May-1945  Karie Mainland, PT 04/13/16 2:31 PM Phone: [phone] Fax: [phone]

## 2016-04-15 ENCOUNTER — Ambulatory Visit: Payer: Medicare Other | Admitting: Physical Therapy

## 2016-04-15 DIAGNOSIS — M48062 Spinal stenosis, lumbar region with neurogenic claudication: Secondary | ICD-10-CM | POA: Diagnosis not present

## 2016-04-16 ENCOUNTER — Ambulatory Visit: Payer: Medicare Other | Admitting: Physical Therapy

## 2016-04-16 DIAGNOSIS — G8929 Other chronic pain: Secondary | ICD-10-CM | POA: Diagnosis not present

## 2016-04-16 DIAGNOSIS — M5440 Lumbago with sciatica, unspecified side: Secondary | ICD-10-CM | POA: Diagnosis not present

## 2016-04-16 DIAGNOSIS — M5417 Radiculopathy, lumbosacral region: Secondary | ICD-10-CM

## 2016-04-16 DIAGNOSIS — M6281 Muscle weakness (generalized): Secondary | ICD-10-CM

## 2016-04-16 DIAGNOSIS — R262 Difficulty in walking, not elsewhere classified: Secondary | ICD-10-CM

## 2016-04-16 NOTE — Therapy (Signed)
[place], [place], [address] Phone: [phone]   Fax:  [phone]  Physical Therapy Treatment  Patient Details  Name: Katie Booker MRN: [id number] Date of Birth: 1945-11-27 Referring Provider: Dr. Ellene Route   Encounter Date: 04/16/2016      PT End of Session - 04/16/16 0853    Visit Number 8   Number of Visits 16   Date for PT Re-Evaluation 05/04/16   PT Start Time 0848   Activity Tolerance Patient tolerated treatment well   Behavior During Therapy [facility] for tasks assessed/performed      Past Medical History:  Diagnosis Date  . Anal fissure   . ANEMIA-NOS 09/18/2009   takes Ferrous Sulfate daily  . Anxiety    takes Xanax prn sleep and anxiety  . Cancer ([place])    basal cell left cheek  . Chronic back pain    scoliosis/stenosis/spondylosis  . DEPRESSION 09/18/2009   takes Celexa daily  . DIABETES MELLITUS, TYPE II 09/18/2009   takes Amaryl daily and Metformin  . GERD 09/18/2009   takes Omeprazole daily  . Headache(784.0)    sinus HA  . Hemorrhoids   . Histoplasmosis    hx of  . History of bronchitis    last time around 1990  . History of colon polyps   . HYPERLIPIDEMIA 09/18/2009   takes Lipitor daily  . HYPERTENSION 09/18/2009   takes Atenolol daily and Lisinopril  . HYPOTHYROIDISM 09/18/2009  . Insomnia    takes trazodone and ambien nightly  . Joint pain   . Joint swelling   . Morbid obesity ([place])   . Obesity   . OSA (obstructive sleep apnea)    has a CPAP and sleep study report in epic from 2009  . OSTEOARTHRITIS 09/18/2009  . Peripheral neuropathy (HCC)    takes Lyrica daily  . Shortness of breath    with exertion  . Skin spots, red    right above both elbows;pt states always there  . Spondylolisthesis at L1-L2 level   . Umbilical hernia   . Urinary frequency   . Urinary urgency     Past Surgical History:  Procedure Laterality Date  . ANTERIOR CERVICAL DECOMP/DISCECTOMY  FUSION N/A 09/02/2014   Procedure: Cervical five- six  Anterior cervical decompression fusion;  Surgeon: Kristeen Miss, MD;  Location: [facility] [facility];  Service: Neurosurgery;  Laterality: N/A;  C5-6 Anterior cervical decompression/diskectomy/fusion  . BACK SURGERY    . CARPAL TUNNEL RELEASE     RIGHT      2016   . COLONOSCOPY  2007  . ESOPHAGOGASTRODUODENOSCOPY  2007  . HEMORRHOID SURGERY  03/23/2011   Procedure: HEMORRHOIDECTOMY;  Surgeon: Joyice Faster. Cornett, MD;  Location: [place];  Service: General;  Laterality: N/A;  lateral internal sphincterotomy and hemorrhoidectomy  . SPHINCTEROTOMY    . THORACIC DISCECTOMY N/A 12/15/2012   Procedure: Thoracic ten-eleven Thoracic laminectomy;  Surgeon: Kristeen Miss, MD;  Location: [place] [facility];  Service: Neurosurgery;  Laterality: N/A;  Thoracic ten-eleven Thoracic laminectomy  . tubal fulgaration  1991    There were no vitals filed for this visit.      Subjective Assessment - 04/16/16 0848    Subjective Saw Dr. Ellene Route.  He did not agree with the report, said it was MILD not moderate.  Did not sleep well night before last and yesterday morning I could not straighten up which has not really happened before.     Pertinent History  L1-L5 fusion (2 separate 11/2012, 02/2015) Thoracic laminectomy, C5-C6 ACDF (2016) , L1-L2 PLIF (02/2015) L knee OA with cortisone Oct. 2017   Currently in Pain? No/denies              [facility] Adult PT Treatment/Exercise - 04/16/16 0919      Lumbar Exercises: Standing   Wall Slides 15 reps   Other Standing Lumbar Exercises wall weightshifting for glute activation and ant hip stretching.  Able to improve hip ext, some mild discomfort in middle back.     Other Standing Lumbar Exercises balance on foam with small ROM head turns and nods     Shoulder Exercises: Standing   Horizontal ABduction Strengthening;Both;15 reps   Theraband Level (Shoulder Horizontal ABduction) Level 1 (Yellow)   External Rotation  Strengthening;Both;15 reps   Theraband Level (Shoulder External Rotation) Level 1 (Yellow)   Other Standing Exercises against wall for posture      Electrical Stimulation   Electrical Stimulation Location lumbar    Electrical Stimulation Action IFC    Electrical Stimulation Parameters 14   Electrical Stimulation Goals Pain                  PT Short Term Goals - 04/06/16 1444      PT SHORT TERM GOAL #1   Title Pt will be complete balance assessment and set goal.     Baseline 50/56   Time 4   Period Weeks   Status Achieved     PT SHORT TERM GOAL #2   Title Pt will be I with HEP    Baseline doing a couple of exercises   Time 4   Period Weeks   Status On-going     PT SHORT TERM GOAL #3   Title Pt will be able to report min pain in low back with transfers, rolling over in bed or on therapy mat.    Baseline able to transfer on and off mat, supine to sit without c/o pain however SOB is most bothersome    Time 4   Period Weeks   Status On-going     PT SHORT TERM GOAL #4   Title Pt will be able to walk in grocery store with cart and report min fatigue, pain overall.    Time 4   Period Weeks   Status Unable to assess           PT Long Term Goals - 04/16/16 0933      PT LONG TERM GOAL #1   Title Pt will be able to understand good body mechanics, posture and utilize at home for long term pain control.    Status Partially Met     PT LONG TERM GOAL #2   Title Pt will be able to use 3 self care strategies for home use (other than pain meds) to reduce pain.    Baseline shower, positioning (hard chair), does not tend to turn to HEP      PT LONG TERM GOAL #3   Title Pt will increase hip strength to 4+/5 in abduction, extension to improve tolerance for gait with LRAD.    Status Unable to assess     PT LONG TERM GOAL #4   Title FOTO score will improve to less than 40% impaired.    Status Unable to assess     PT LONG TERM GOAL #5   Title Pt will be able to  complete ADLs, light housework for up to 15 min in standing  with no increase in back pain.    Status On-going               Plan - 04/16/16 0940    Clinical Impression Statement Patient staying in standing with frequent seated rest breaks today for session.  She has not found a 3rd strategy for pain relief (other than meds) and has not been very consistent with HEP. She is contemplating our Group exercise program post PT for continued monitored exercise (prior to [facility]) .  Will cont to work for 2 more weeks to progress.    PT Next Visit Plan narrow balance, and reaching outside BOS if pain controlled.  Review standing hip, supine trunk stretches as tolerated, core in various positions, monitor vitals    PT Home Exercise Plan basic back: LTR, hamstring, knee to chest , abdominal isometric, DC bridge for now , standing 3 way hip, calf stretch    Consulted and Agree with Plan of Care Patient      Patient will benefit from skilled therapeutic intervention in order to improve the following deficits and impairments:  Decreased range of motion, Difficulty walking, Increased fascial restricitons, Obesity, Impaired UE functional use, Pain, Decreased activity tolerance, Decreased balance, Impaired flexibility, Hypomobility, Postural dysfunction, Impaired sensation, Decreased strength, Decreased mobility  Visit Diagnosis: Chronic low back pain with sciatica, sciatica laterality unspecified, unspecified back pain laterality  Difficulty in walking, not elsewhere classified  Muscle weakness (generalized)  Radiculopathy, lumbosacral region     Problem List Patient Active Problem List   Diagnosis Date Noted  . Spondylolisthesis at L1-L2 level 03/18/2015  . Spondylosis with myelopathy 09/02/2014  . Cervical spondylosis with myelopathy 09/02/2014  . Anxiety 12/21/2012  . Chronic back pain 12/21/2012  . Insomnia 12/21/2012  . 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  Spinal stenosis, lumbar region, with neurogenic claudication 10/26/2012  . Spinal stenosis, thoracic 10/26/2012  . Umbilical hernia 02/10/7587  . OSA (obstructive sleep apnea) 11/12/2011  . Exogenous obesity 11/12/2011  . 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 Hypothyroidism 09/18/2009  . Diabetes mellitus without complication ([place]) 32/54/9826  . Dyslipidemia 09/18/2009  . Anemia 09/18/2009  . Depression 09/18/2009  . Essential hypertension 09/18/2009  . GERD 09/18/2009  . Osteoarthritis 09/18/2009    Tysheem Accardo 04/16/2016, 12:11 PM  [facility] [address] [place], [place], [address] Phone: [phone]   Fax:  [phone]  Name: DARLYS BUIS MRN: [id number] Date of Birth: [birth year]  Raeford Razor, PT 04/16/16 12:11 PM Phone: [phone] Fax: [phone]

## 2016-04-19 ENCOUNTER — Other Ambulatory Visit (INDEPENDENT_AMBULATORY_CARE_PROVIDER_SITE_OTHER): Payer: Medicare Other

## 2016-04-19 DIAGNOSIS — R7989 Other specified abnormal findings of blood chemistry: Secondary | ICD-10-CM | POA: Diagnosis not present

## 2016-04-19 DIAGNOSIS — Z0000 Encounter for general adult medical examination without abnormal findings: Secondary | ICD-10-CM

## 2016-04-19 LAB — CBC WITH DIFFERENTIAL/PLATELET
BASOS PCT: 0.6 % (ref 0.0–3.0)
Basophils Absolute: 0 10*3/uL (ref 0.0–0.1)
EOS ABS: 0.2 10*3/uL (ref 0.0–0.7)
Eosinophils Relative: 3.5 % (ref 0.0–5.0)
HCT: 35.6 % — ABNORMAL LOW (ref 36.0–46.0)
HEMOGLOBIN: 11.5 g/dL — AB (ref 12.0–15.0)
LYMPHS ABS: 2.5 10*3/uL (ref 0.7–4.0)
Lymphocytes Relative: 40.9 % (ref 12.0–46.0)
MCHC: 32.4 g/dL (ref 30.0–36.0)
MCV: 76.6 fl — ABNORMAL LOW (ref 78.0–100.0)
MONO ABS: 0.4 10*3/uL (ref 0.1–1.0)
Monocytes Relative: 7.2 % (ref 3.0–12.0)
Neutro Abs: 2.9 10*3/uL (ref 1.4–7.7)
Neutrophils Relative %: 47.8 % (ref 43.0–77.0)
Platelets: 210 10*3/uL (ref 150.0–400.0)
RBC: 4.65 Mil/uL (ref 3.87–5.11)
RDW: 15.3 % (ref 11.5–15.5)
WBC: 6 10*3/uL (ref 4.0–10.5)

## 2016-04-19 LAB — LIPID PANEL
CHOL/HDL RATIO: 3
Cholesterol: 189 mg/dL (ref 0–200)
HDL: 53.9 mg/dL (ref >39.00)
NonHDL: 134.7
Triglycerides: 235 mg/dL — ABNORMAL HIGH (ref 0.0–149.0)
VLDL: 47 mg/dL — AB (ref 0.0–40.0)

## 2016-04-19 LAB — POC URINALSYSI DIPSTICK (AUTOMATED)
BILIRUBIN UA: NEGATIVE
Blood, UA: NEGATIVE
Glucose, UA: NEGATIVE
KETONES UA: NEGATIVE
Nitrite, UA: NEGATIVE
PH UA: 5.5
PROTEIN UA: NEGATIVE
SPEC GRAV UA: 1.02
Urobilinogen, UA: 0.2

## 2016-04-19 LAB — LDL CHOLESTEROL, DIRECT: LDL DIRECT: 95 mg/dL

## 2016-04-19 LAB — HEPATIC FUNCTION PANEL
ALBUMIN: 4.2 g/dL (ref 3.5–5.2)
ALT: 27 U/L (ref 0–35)
AST: 29 U/L (ref 0–37)
Alkaline Phosphatase: 66 U/L (ref 39–117)
Bilirubin, Direct: 0.1 mg/dL (ref 0.0–0.3)
TOTAL PROTEIN: 6.6 g/dL (ref 6.0–8.3)
Total Bilirubin: 0.3 mg/dL (ref 0.2–1.2)

## 2016-04-19 LAB — BASIC METABOLIC PANEL
BUN: 19 mg/dL (ref 6–23)
CHLORIDE: 99 meq/L (ref 96–112)
CO2: 32 mEq/L (ref 19–32)
CREATININE: 0.81 mg/dL (ref 0.40–1.20)
Calcium: 9.5 mg/dL (ref 8.4–10.5)
GFR: 74.27 mL/min (ref >60.00)
GLUCOSE: 95 mg/dL (ref 70–99)
Potassium: 4.5 mEq/L (ref 3.5–5.1)
Sodium: 140 mEq/L (ref 135–145)

## 2016-04-19 LAB — MICROALBUMIN / CREATININE URINE RATIO
Creatinine,U: 94.4 mg/dL
MICROALB/CREAT RATIO: 0.7 mg/g (ref 0.0–30.0)
Microalb, Ur: <0.7 mg/dL (ref 0.0–1.9)

## 2016-04-19 LAB — TSH: TSH: 12.41 u[IU]/mL — AB (ref 0.35–4.50)

## 2016-04-19 LAB — HEMOGLOBIN A1C: Hgb A1c MFr Bld: 6.9 % — ABNORMAL HIGH (ref 4.6–6.5)

## 2016-04-20 ENCOUNTER — Ambulatory Visit: Payer: Medicare Other | Admitting: Physical Therapy

## 2016-04-20 ENCOUNTER — Other Ambulatory Visit: Payer: Self-pay | Admitting: Internal Medicine

## 2016-04-20 DIAGNOSIS — M5440 Lumbago with sciatica, unspecified side: Secondary | ICD-10-CM | POA: Diagnosis not present

## 2016-04-20 DIAGNOSIS — M5417 Radiculopathy, lumbosacral region: Secondary | ICD-10-CM | POA: Diagnosis not present

## 2016-04-20 DIAGNOSIS — G8929 Other chronic pain: Secondary | ICD-10-CM

## 2016-04-20 DIAGNOSIS — R262 Difficulty in walking, not elsewhere classified: Secondary | ICD-10-CM

## 2016-04-20 DIAGNOSIS — M6281 Muscle weakness (generalized): Secondary | ICD-10-CM | POA: Diagnosis not present

## 2016-04-20 NOTE — Therapy (Signed)
[place], [place], [address] Phone: [phone]   Fax:  [phone]  Physical Therapy Treatment  Patient Details  Name: Katie Booker MRN: [id number] Date of Birth: Jul 13, 1945 Referring Provider: Dr. Ellene Route   Encounter Date: 04/20/2016      PT End of Session - 04/20/16 1419    Visit Number 9   Number of Visits 16   Date for PT Re-Evaluation 05/04/16   PT Start Time 3893   PT Stop Time 1416   PT Time Calculation (min) 48 min   Activity Tolerance Patient tolerated treatment well   Behavior During Therapy [facility] for tasks assessed/performed      Past Medical History:  Diagnosis Date  . Anal fissure   . ANEMIA-NOS 09/18/2009   takes Ferrous Sulfate daily  . Anxiety    takes Xanax prn sleep and anxiety  . Cancer ([place])    basal cell left cheek  . Chronic back pain    scoliosis/stenosis/spondylosis  . DEPRESSION 09/18/2009   takes Celexa daily  . DIABETES MELLITUS, TYPE II 09/18/2009   takes Amaryl daily and Metformin  . GERD 09/18/2009   takes Omeprazole daily  . Headache(784.0)    sinus HA  . Hemorrhoids   . Histoplasmosis    hx of  . History of bronchitis    last time around 1990  . History of colon polyps   . HYPERLIPIDEMIA 09/18/2009   takes Lipitor daily  . HYPERTENSION 09/18/2009   takes Atenolol daily and Lisinopril  . HYPOTHYROIDISM 09/18/2009  . Insomnia    takes trazodone and ambien nightly  . Joint pain   . Joint swelling   . Morbid obesity ([place])   . Obesity   . OSA (obstructive sleep apnea)    has a CPAP and sleep study report in epic from 2009  . OSTEOARTHRITIS 09/18/2009  . Peripheral neuropathy ([facility])    takes Lyrica daily  . Shortness of breath    with exertion  . Skin spots, red    right above both elbows;pt states always there  . Spondylolisthesis at L1-L2 level   . Umbilical hernia   . Urinary frequency   . Urinary urgency     Past Surgical History:  Procedure  Laterality Date  . ANTERIOR CERVICAL DECOMP/DISCECTOMY FUSION N/A 09/02/2014   Procedure: Cervical five- six  Anterior cervical decompression fusion;  Surgeon: Kristeen Miss, MD;  Location: [place] [facility];  Service: Neurosurgery;  Laterality: N/A;  C5-6 Anterior cervical decompression/diskectomy/fusion  . BACK SURGERY    . CARPAL TUNNEL RELEASE     RIGHT      2016   . COLONOSCOPY  2007  . ESOPHAGOGASTRODUODENOSCOPY  2007  . HEMORRHOID SURGERY  03/23/2011   Procedure: HEMORRHOIDECTOMY;  Surgeon: Joyice Faster. Cornett, MD;  Location: [place];  Service: General;  Laterality: N/A;  lateral internal sphincterotomy and hemorrhoidectomy  . SPHINCTEROTOMY    . THORACIC DISCECTOMY N/A 12/15/2012   Procedure: Thoracic ten-eleven Thoracic laminectomy;  Surgeon: Kristeen Miss, MD;  Location: [place] [facility];  Service: Neurosurgery;  Laterality: N/A;  Thoracic ten-eleven Thoracic laminectomy  . tubal fulgaration  1991    There were no vitals filed for this visit.      Subjective Assessment - 04/20/16 1350    Subjective Have been very lazy, took the wrong dose Muscle relaxer.  I dont want ot tell you every little ache and pain.    Currently in Pain? Yes  Pain Score 3    Pain Location Hip   Pain Orientation Right   Pain Descriptors / Indicators Aching   Pain Type Chronic pain   Pain Onset More than a month ago   Pain Frequency Intermittent                         [facility] Adult PT Treatment/Exercise - 04/20/16 1351      Lumbar Exercises: Stretches   Active Hamstring Stretch 2 reps;30 seconds  c/o pain afterward    Active Hamstring Stretch Limitations seated on edge of mat    Lower Trunk Rotation 10 seconds   Lower Trunk Rotation Limitations on black ball in semi reclined      Lumbar Exercises: Aerobic   Stationary Bike 6 min L4 UE and LE      Lumbar Exercises: Supine   Clam 10 reps   Clam Limitations red    Heel Slides 10 reps   Heel Slides Limitations with ball       Knee/Hip Exercises: Standing   Hip Abduction Stengthening;Both;1 set;10 reps   Hip Extension Stengthening;Both;1 set;10 reps   Forward Step Up Both;1 set;10 reps   Functional Squat --   Other Standing Knee Exercises lateral walking/stepping in parallel bars x 4 x 12 feet                   PT Short Term Goals - 04/20/16 1541      PT SHORT TERM GOAL #1   Title Pt will be complete balance assessment and set goal.     Status Achieved     PT SHORT TERM GOAL #2   Title Pt will be I with HEP    Status On-going     PT SHORT TERM GOAL #3   Title Pt will be able to report min pain in low back with transfers, rolling over in bed or on therapy mat.    Status On-going           PT Long Term Goals - 04/16/16 0933      PT LONG TERM GOAL #1   Title Pt will be able to understand good body mechanics, posture and utilize at home for long term pain control.    Status Partially Met     PT LONG TERM GOAL #2   Title Pt will be able to use 3 self care strategies for home use (other than pain meds) to reduce pain.    Baseline shower, positioning (hard chair), does not tend to turn to HEP      PT LONG TERM GOAL #3   Title Pt will increase hip strength to 4+/5 in abduction, extension to improve tolerance for gait with LRAD.    Status Unable to assess     PT LONG TERM GOAL #4   Title FOTO score will improve to less than 40% impaired.    Status Unable to assess     PT LONG TERM GOAL #5   Title Pt will be able to complete ADLs, light housework for up to 15 min in standing with no increase in back pain.    Status On-going               Plan - 04/20/16 1540    Clinical Impression Statement Worked on building endurance and strength in standing.  Limited by Rt. hip pain today.  Needed rest breaks but did well.    PT Next Visit Plan reinforce  regular HEP,  Review standing hip, supine trunk stretches as tolerated, core in various positions, monitor dyspnea   PT Home Exercise  Plan basic back: LTR, hamstring, knee to chest , abdominal isometric, DC bridge for now , standing 3 way hip, calf stretch    Consulted and Agree with Plan of Care Patient      [place]   Patient will benefit from skilled therapeutic intervention in order to improve the following deficits and impairments:  Decreased range of motion, Difficulty walking, Increased fascial restricitons, Obesity, Impaired UE functional use, Pain, Decreased activity tolerance, Decreased balance, Impaired flexibility, Hypomobility, Postural dysfunction, Impaired sensation, Decreased strength, Decreased mobility  Visit Diagnosis: Chronic low back pain with sciatica, sciatica laterality unspecified, unspecified back pain laterality  Difficulty in walking, not elsewhere classified  Muscle weakness (generalized)  Radiculopathy, lumbosacral region     Problem List Patient Active Problem List   Diagnosis Date Noted  . Spondylolisthesis at L1-L2 level 03/18/2015  . Spondylosis with myelopathy 09/02/2014  . Cervical spondylosis with myelopathy 09/02/2014  . Anxiety 12/21/2012  . Chronic back pain 12/21/2012  . Insomnia 12/21/2012  . 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 Spinal stenosis, lumbar region, with neurogenic claudication 10/26/2012  . Spinal stenosis, thoracic 10/26/2012  . Umbilical hernia 96/72/8979  . OSA (obstructive sleep apnea) 11/12/2011  . Exogenous obesity 11/12/2011  . 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 Hypothyroidism 09/18/2009  . Diabetes mellitus without complication ([place]) 15/05/1362  . Dyslipidemia 09/18/2009  . Anemia 09/18/2009  . Depression 09/18/2009  . Essential hypertension 09/18/2009  . GERD 09/18/2009  . Osteoarthritis 09/18/2009    PAA,JENNIFER 04/20/2016, 3:43 PM  [facility] [address] [place], [place], [address] Phone: [phone]   Fax:  [phone]  Name: Katie Booker MRN: [id number] Date of Birth: 01/26/1946   Raeford Razor, PT 04/20/16 3:43 PM Phone: [phone] Fax: [phone]

## 2016-04-22 ENCOUNTER — Ambulatory Visit: Payer: Medicare Other | Admitting: Physical Therapy

## 2016-04-23 ENCOUNTER — Encounter: Payer: Self-pay | Admitting: Internal Medicine

## 2016-04-23 ENCOUNTER — Ambulatory Visit (INDEPENDENT_AMBULATORY_CARE_PROVIDER_SITE_OTHER): Payer: Medicare Other | Admitting: Internal Medicine

## 2016-04-23 VITALS — BP 120/68 | HR 94 | Temp 98.0°F | Ht 61.0 in | Wt 203.0 lb

## 2016-04-23 DIAGNOSIS — Z0000 Encounter for general adult medical examination without abnormal findings: Secondary | ICD-10-CM | POA: Diagnosis not present

## 2016-04-23 MED ORDER — LEVOTHYROXINE SODIUM 175 MCG PO TABS: 175.0000 ug | ORAL_TABLET | Freq: Every day | ORAL | 5 refills | Status: DC

## 2016-04-23 MED ORDER — HYDROCODONE-ACETAMINOPHEN 7.5-325 MG PO TABS: 1.0000 | ORAL_TABLET | Freq: Four times a day (QID) | ORAL | 0 refills | Status: DC | PRN

## 2016-04-23 NOTE — Progress Notes (Signed)
Subjective:    Patient ID: Katie Booker, female    DOB: Jun 29, 1945, 71 y.o.   MRN: [id number]  HPI  71 year old patient who is seen today for a preventive health examination and Medicare wellness visit Her main issue is chronic low back pain.  She's had a recent lumbar MRI and is followed by neurosurgery.  Her chronic back pain is not well controlled She has obesity and type 2 diabetes.  Hemoglobin A1c 6.9 She has essential hypertension as well as obesity with OSA She has hypothyroidism with a elevated TSH  Last colonoscopy 2007.  She has received a recall letter from [facility] but she wishes to switch to another provider  Past Medical History:  Diagnosis Date  . Anal fissure   . ANEMIA-NOS 09/18/2009   takes Ferrous Sulfate daily  . Anxiety    takes Xanax prn sleep and anxiety  . Cancer (HCC)    basal cell left cheek  . Chronic back pain    scoliosis/stenosis/spondylosis  . DEPRESSION 09/18/2009   takes Celexa daily  . DIABETES MELLITUS, TYPE II 09/18/2009   takes Amaryl daily and Metformin  . GERD 09/18/2009   takes Omeprazole daily  . Headache(784.0)    sinus HA  . Hemorrhoids   . Histoplasmosis    hx of  . History of bronchitis    last time around 1990  . History of colon polyps   . HYPERLIPIDEMIA 09/18/2009   takes Lipitor daily  . HYPERTENSION 09/18/2009   takes Atenolol daily and Lisinopril  . HYPOTHYROIDISM 09/18/2009  . Insomnia    takes trazodone and ambien nightly  . Joint pain   . Joint swelling   . Morbid obesity (HCC)   . Obesity   . OSA (obstructive sleep apnea)    has a CPAP and sleep study report in epic from 2009  . OSTEOARTHRITIS 09/18/2009  . Peripheral neuropathy (H[facility])    takes Lyrica daily  . Shortness of breath    with exertion  . Skin spots, red    right above both elbows;pt states always there  . Spondylolisthesis at L1-L2 level   . Umbilical hernia   . Urinary frequency   . Urinary urgency      Social History   Social History    . Marital status: Single    Spouse name: N/A  . Number of children: N/A  . Years of education: N/A   Occupational History  . retired    Social History Main Topics  . Smoking status: Former Smoker    Packs/day: 1.00    Years: 20.00    Types: Cigarettes    Quit date: 02/23/2004  . Smokeless tobacco: Never Used     Comment: quit 2007  . Alcohol use 0.0 oz/week     Comment: rare  . Drug use: No  . Sexual activity: No   Other Topics Concern  . Not on file   Social History Narrative  . No narrative on file    Past Surgical History:  Procedure Laterality Date  . ANTERIOR CERVICAL DECOMP/DISCECTOMY FUSION N/A 09/02/2014   Procedure: Cervical five- six  Anterior cervical decompression fusion;  Surgeon: Barnett Abu, MD;  Location: [facility];  Service: Neurosurgery;  Laterality: N/A;  C5-6 Anterior cervical decompression/diskectomy/fusion  . BACK SURGERY    . CARPAL TUNNEL RELEASE     RIGHT      2016   . COLONOSCOPY  2007  . ESOPHAGOGASTRODUODENOSCOPY  2007  . HEMORRHOID SURGERY  03/23/2011   Procedure: HEMORRHOIDECTOMY;  Surgeon: Clovis Pu. Cornett, MD;  Location: [facility];  Service: General;  Laterality: N/A;  lateral internal sphincterotomy and hemorrhoidectomy  . SPHINCTEROTOMY    . THORACIC DISCECTOMY N/A 12/15/2012   Procedure: Thoracic ten-eleven Thoracic laminectomy;  Surgeon: Barnett Abu, MD;  Location: [facility];  Service: Neurosurgery;  Laterality: N/A;  Thoracic ten-eleven Thoracic laminectomy  . tubal fulgaration  1991    Family History  Problem Relation Age of Onset  . Diabetes Father   . COPD Father   . Cancer Maternal Grandfather     stomach    Allergies  Allergen Reactions  . Duraprep [Tersaseptic] Itching and Rash    Irritation everywhere prep was used  . Hydromorphone Hcl     Makes crazy  . Morphine And Related Other (See Comments)    hallucinations    Current Outpatient Prescriptions on File Prior to Visit  Medication Sig  Dispense Refill  . ALPRAZolam (XANAX) 0.5 MG tablet Take 1 tablet (0.5 mg total) by mouth 3 (three) times daily. 90 tablet 5  . atenolol (TENORMIN) 25 MG tablet TAKE 1 TABLET BY MOUTH  DAILY 90 tablet 1  . atorvastatin (LIPITOR) 40 MG tablet TAKE 1 TABLET BY MOUTH ONCE DAILY 90 tablet 3  . Calcium Carb-Cholecalciferol (CALCIUM 600/VITAMIN D3 PO) Take 1 tablet by mouth 2 (two) times daily.    . Carboxymethylcellulose Sodium (LUBRICANT EYE DROPS OP) Apply 2 drops to eye 4 (four) times daily as needed.    . citalopram (CELEXA) 40 MG tablet TAKE 1 TABLET BY MOUTH  DAILY 90 tablet 1  . docusate sodium (COLACE) 100 MG capsule Take 100 mg by mouth 2 (two) times daily.    Marland Kitchen estradiol (ESTRACE) 0.1 MG/GM vaginal cream Place 1 Applicatorful vaginally 3 (three) times a week. 42.5 g 12  . gemfibrozil (LOPID) 600 MG tablet TAKE 1 TABLET BY MOUTH TWO  TIMES DAILY BEFORE MEALS 180 tablet 1  . HYDROcodone-acetaminophen (NORCO/VICODIN) 5-325 MG tablet 1 by mouth every 6 hours as needed for pain **DO NOT EXCEED 4 GM OF TYLENOL IN 24 HOURS** 120 tablet 0  . hydrocortisone 2.5 % cream Apply 1 application topically 2 (two) times daily.    . Lancets (ONETOUCH ULTRASOFT) lancets Use daily 100 each 6  . levothyroxine (SYNTHROID, LEVOTHROID) 150 MCG tablet TAKE 1 TABLET BY MOUTH ONCE DAILY 90 tablet 1  . lisinopril (PRINIVIL,ZESTRIL) 10 MG tablet TAKE 1 TABLET BY MOUTH ONCE DAILY 90 tablet 1  . loratadine (CLARITIN) 10 MG tablet Take 10 mg by mouth daily as needed for allergies.    . meloxicam (MOBIC) 15 MG tablet TAKE 1 TABLET BY MOUTH  DAILY 90 tablet 1  . metFORMIN (GLUCOPHAGE) 1000 MG tablet TAKE 1 TABLET BY MOUTH TWO  TIMES DAILY WITH MEALS 180 tablet 1  . methocarbamol (ROBAXIN) 500 MG tablet Take 500 mg by mouth every 6 (six) hours as needed for muscle spasms.    . Multiple Vitamin (MULTIVITAMIN) tablet Take 1 tablet by mouth daily.    . Omega-3 Fatty Acids (FISH OIL) 1200 MG CAPS Take 2 capsules by mouth 2 (two)  times daily.    Marland Kitchen omeprazole (PRILOSEC) 20 MG capsule Take 1 capsule by mouth two times daily 180 capsule 3  . ONE TOUCH ULTRA TEST test strip Use 1 daily as directed 100 each 3  . pioglitazone (ACTOS) 15 MG tablet Take 1 tablet (15 mg total) by mouth daily. 90 tablet 3  .  pregabalin (LYRICA) 75 MG capsule Take 1 capsule (75 mg total) by mouth daily. 90 capsule 1  . rOPINIRole (REQUIP) 1 MG tablet Take 1 mg by mouth at bedtime.     Marland Kitchen. tobramycin (TOBREX) 0.3 % ophthalmic ointment Place 1 application into the right eye. Apply the day before, the day of, and the day after eye injections.    . traZODone (DESYREL) 100 MG tablet TAKE 2 TABLETS BY MOUTH AT  BEDTIME 180 tablet 1   No current facility-administered medications on file prior to visit.     BP 120/68 (BP Location: Left Arm, Patient Position: Sitting, Cuff Size: Normal)   Pulse 94   Temp 98 F (36.7 C) (Oral)   Ht 5\' 1"  (1.549 m)   Wt 203 lb (92.1 kg)   SpO2 100%   BMI 38.36 kg/m   Medicare wellness visit  1. Risk factors, based on past  M,S,F history.  Cardiovascular risk factors include essential hypertension and dyslipidemia as well as diabetes  2.  Physical activities:limited due to chronic low back pain.  She does participate in water aerobics with a [employer] program  3.  Depression/mood:no history of major depression that has been treated with Celexa for mild anxiety, depression  4.  Hearing:no deficits  5.  ADL's:independent  6.  Fall risk:moderate due to weight  7.  Home safety:no problems identified  8.  Height weight, and visual acuity;height and weight unchanged.  Annual eye examination encouraged  9.  Counseling: continue efforts at weight loss and aggressive risk factor modification  10. Lab orders based on risk factors:laboratory profile reviewed  11. Referral :GI referral for colonoscopy.  Needs mammogram and annual eye examination  12. Care plan:follow-up colonoscopy  13. Cognitive assessment:  alert and oriented with normal affect  14. Screening: Patient provided with a written and personalized 5-10 year screening schedule in the AVS.    15. Provider List Update: GI primary care and neurosurgery, ophthalmology      Review of Systems  Constitutional: Negative for appetite change, fatigue, fever and unexpected weight change.  HENT: Negative for congestion, dental problem, ear pain, hearing loss, mouth sores, nosebleeds, sinus pressure, sore throat, tinnitus, trouble swallowing and voice change.   Eyes: Negative for photophobia, pain, redness and visual disturbance.  Respiratory: Negative for cough, chest tightness and shortness of breath.   Cardiovascular: Negative for chest pain, palpitations and leg swelling.  Gastrointestinal: Negative for abdominal distention, abdominal pain, blood in stool, constipation, diarrhea, nausea, rectal pain and vomiting.  Genitourinary: Negative for difficulty urinating, dysuria, flank pain, frequency, genital sores, hematuria, menstrual problem, pelvic pain, urgency, vaginal bleeding, vaginal discharge and vaginal pain.  Musculoskeletal: Positive for back pain. Negative for arthralgias and neck stiffness.  Skin: Negative for rash.  Neurological: Negative for dizziness, syncope, speech difficulty, weakness, light-headedness, numbness and headaches.  Hematological: Negative for adenopathy. Does not bruise/bleed easily.  Psychiatric/Behavioral: Negative for agitation, behavioral problems, dysphoric mood, self-injury and suicidal ideas. The patient is not nervous/anxious.        Objective:   Physical Exam  Constitutional: She is oriented to person, place, and time. She appears well-developed and well-nourished.  Weight 203 Blood pressure low normal  HENT:  Head: Normocephalic and atraumatic.  Right Ear: External ear normal.  Left Ear: External ear normal.  Mouth/Throat: Oropharynx is clear and moist.  Pharyngeal crowding  Eyes: Conjunctivae  and EOM are normal.  Neck: Normal range of motion. Neck supple. No JVD present. No thyromegaly present.  Cardiovascular: Normal rate, regular rhythm, normal heart sounds and intact distal pulses.   No murmur heard. Pulmonary/Chest: Effort normal and breath sounds normal. She has no wheezes. She has no rales.  Abdominal: Soft. Bowel sounds are normal. She exhibits no distension and no mass. There is no tenderness. There is no rebound and no guarding.  Umbilical hernia  Genitourinary: Vagina normal.  Musculoskeletal: Normal range of motion. She exhibits no edema or tenderness.  Decreased sensation, dorsal aspect, right foot  Neurological: She is alert and oriented to person, place, and time. She has normal reflexes. No cranial nerve deficit. She exhibits normal muscle tone. Coordination normal.  Skin: Skin is warm and dry. No rash noted.  Psychiatric: She has a normal mood and affect. Her behavior is normal.          Assessment & Plan:   Preventive health examination Medicare wellness visit Essential hypertension, stable Obesity/OSA Hypothyroidism.  Will increase levothyroxine to 175 g daily Chronic low back pain.  Increase Vicodin to 7.5 every 6 hours when necessary  Diabetes mellitus.  Weight loss encouraged.  Follow-up 3 months.  Annual eye examination encouraged  Rogelia Boga

## 2016-04-23 NOTE — Progress Notes (Signed)
Pre visit review using our clinic review tool, if applicable. No additional management support is needed unless otherwise documented below in the visit note. 

## 2016-04-23 NOTE — Patient Instructions (Addendum)
Limit your sodium (Salt) intake   Please check your hemoglobin A1c every 3 months    It is important that you exercise regularly, at least 20 minutes 3 to 4 times per week.  If you develop chest pain or shortness of breath seek  medical attention.  You need to lose weight.  Consider a lower calorie diet and regular exercise.  Return in 3 months for follow-up  Schedule your mammogram.  Schedule your colonoscopy to help detect colon cancer.

## 2016-04-26 ENCOUNTER — Other Ambulatory Visit: Payer: Self-pay | Admitting: Internal Medicine

## 2016-04-26 MED ORDER — LEVOTHYROXINE SODIUM 175 MCG PO TABS: 175.0000 ug | ORAL_TABLET | Freq: Every day | ORAL | 5 refills | Status: DC

## 2016-04-27 ENCOUNTER — Ambulatory Visit: Payer: Medicare Other | Admitting: Physical Therapy

## 2016-04-29 ENCOUNTER — Ambulatory Visit: Payer: Medicare Other | Admitting: Physical Therapy

## 2016-05-04 ENCOUNTER — Ambulatory Visit: Payer: Medicare Other | Attending: Neurological Surgery | Admitting: Physical Therapy

## 2016-05-04 DIAGNOSIS — R262 Difficulty in walking, not elsewhere classified: Secondary | ICD-10-CM | POA: Insufficient documentation

## 2016-05-04 DIAGNOSIS — M6281 Muscle weakness (generalized): Secondary | ICD-10-CM | POA: Diagnosis not present

## 2016-05-04 DIAGNOSIS — M5440 Lumbago with sciatica, unspecified side: Secondary | ICD-10-CM | POA: Diagnosis not present

## 2016-05-04 DIAGNOSIS — M5417 Radiculopathy, lumbosacral region: Secondary | ICD-10-CM | POA: Insufficient documentation

## 2016-05-04 DIAGNOSIS — G8929 Other chronic pain: Secondary | ICD-10-CM | POA: Insufficient documentation

## 2016-05-04 NOTE — Therapy (Signed)
[place], [place], [address] Phone: [phone]   Fax:  [phone]  Physical Therapy Treatment/Renewal  Patient Details  Name: Katie Booker MRN: [id number] Date of Birth: 04-12-1945 Referring Provider: Dr. Ellene Route   Encounter Date: 05/04/2016      PT End of Session - 05/04/16 1341    Visit Number 10   Number of Visits 16   Date for PT Re-Evaluation 05/21/16   PT Start Time 7564   PT Stop Time 1420   PT Time Calculation (min) 47 min   Activity Tolerance Patient tolerated treatment well   Behavior During Therapy [facility] for tasks assessed/performed      Past Medical History:  Diagnosis Date  . Anal fissure   . ANEMIA-NOS 09/18/2009   takes Ferrous Sulfate daily  . Anxiety    takes Xanax prn sleep and anxiety  . Cancer ([place])    basal cell left cheek  . Chronic back pain    scoliosis/stenosis/spondylosis  . DEPRESSION 09/18/2009   takes Celexa daily  . DIABETES MELLITUS, TYPE II 09/18/2009   takes Amaryl daily and Metformin  . GERD 09/18/2009   takes Omeprazole daily  . Headache(784.0)    sinus HA  . Hemorrhoids   . Histoplasmosis    hx of  . History of bronchitis    last time around 1990  . History of colon polyps   . HYPERLIPIDEMIA 09/18/2009   takes Lipitor daily  . HYPERTENSION 09/18/2009   takes Atenolol daily and Lisinopril  . HYPOTHYROIDISM 09/18/2009  . Insomnia    takes trazodone and ambien nightly  . Joint pain   . Joint swelling   . Morbid obesity ([place])   . Obesity   . OSA (obstructive sleep apnea)    has a CPAP and sleep study report in epic from 2009  . OSTEOARTHRITIS 09/18/2009  . Peripheral neuropathy ([facility])    takes Lyrica daily  . Shortness of breath    with exertion  . Skin spots, red    right above both elbows;pt states always there  . Spondylolisthesis at L1-L2 level   . Umbilical hernia   . Urinary frequency   . Urinary urgency     Past Surgical History:   Procedure Laterality Date  . ANTERIOR CERVICAL DECOMP/DISCECTOMY FUSION N/A 09/02/2014   Procedure: Cervical five- six  Anterior cervical decompression fusion;  Surgeon: Kristeen Miss, MD;  Location: [place] [facility];  Service: Neurosurgery;  Laterality: N/A;  C5-6 Anterior cervical decompression/diskectomy/fusion  . BACK SURGERY    . CARPAL TUNNEL RELEASE     RIGHT      2016   . COLONOSCOPY  2007  . ESOPHAGOGASTRODUODENOSCOPY  2007  . HEMORRHOID SURGERY  03/23/2011   Procedure: HEMORRHOIDECTOMY;  Surgeon: Joyice Faster. Cornett, MD;  Location: [place];  Service: General;  Laterality: N/A;  lateral internal sphincterotomy and hemorrhoidectomy  . SPHINCTEROTOMY    . THORACIC DISCECTOMY N/A 12/15/2012   Procedure: Thoracic ten-eleven Thoracic laminectomy;  Surgeon: Kristeen Miss, MD;  Location: [place] [facility];  Service: Neurosurgery;  Laterality: N/A;  Thoracic ten-eleven Thoracic laminectomy  . tubal fulgaration  1991    There were no vitals filed for this visit.      Subjective Assessment - 05/04/16 1346    Subjective Pt has lost motivation.  She went to [facility] has a stronger hydrocodone. Wants to come 2 x 2 more weeks then do [facility] group    Currently in Pain?  No/denies            [facility] PT Assessment - 05/04/16 1347      AROM   Lumbar Flexion touches floor with fingers   Lumbar Extension 80% pain    Lumbar - Right Side Bend reach to knee   Lumbar - Left Side Bend reach just above knee   Lumbar - Right Rotation pain 50%   Lumbar - Left Rotation pain 50%     Strength   Right Hip Flexion 5/5   Left Hip Flexion 5/5   Right/Left Knee --  5/5   Right/Left Ankle --  5/5     Flexibility   Hamstrings 80 deg                      [facility] Adult PT Treatment/Exercise - 05/04/16 1401      Self-Care   Other Self-Care Comments  HEP review and reinforcement, modifications for pain      Lumbar Exercises: Stretches   Active Hamstring Stretch 2 reps;30 seconds    Active Hamstring Stretch Limitations supine with strap    Lower Trunk Rotation 5 reps;20 seconds   Lower Trunk Rotation Limitations x 10      Lumbar Exercises: Aerobic   Stationary Bike NuStep 6 min L4 UE and LE      Lumbar Exercises: Standing   Other Standing Lumbar Exercises wall weightshifting for glute activation and ant hip stretching.  Able to improve hip ext, some mild discomfort in middle back.       Knee/Hip Exercises: Standing   Hip Flexion Stengthening;Both;1 set   Hip Flexion Limitations high knees x 15    Hip Abduction Stengthening;Both;1 set;10 reps   Hip Extension Stengthening;Both;1 set;10 reps                  PT Short Term Goals - 05/04/16 1447      PT SHORT TERM GOAL #1   Title Pt will be complete balance assessment and set goal.     Status Achieved     PT SHORT TERM GOAL #2   Title Pt will be I with HEP    Baseline inconsistent   Status Partially Met     PT SHORT TERM GOAL #3   Title Pt will be able to report min pain in low back with transfers, rolling over in bed or on therapy mat.    Baseline able to transfer on and off mat, supine to sit without c/o pain however SOB is most bothersome    Status On-going     PT SHORT TERM GOAL #4   Title Pt will be able to walk in grocery store with cart and report min fatigue, pain overall.    Baseline fatigues quickly   Status On-going           PT Long Term Goals - 05/04/16 1448      PT LONG TERM GOAL #1   Title Pt will be able to understand good body mechanics, posture and utilize at home for long term pain control.    Baseline needs reminders    Status Partially Met     PT LONG TERM GOAL #2   Title Pt will be able to use 3 self care strategies for home use (other than pain meds) to reduce pain.    Baseline shower, positioning (hard chair), does not tend to turn to HEP    Status Partially Met     PT LONG TERM  GOAL #3   Title Pt will increase hip strength to 4+/5 in abduction, extension to  improve tolerance for gait with LRAD.    Baseline 3+/5 throughout  but pain in hip and low back    Status On-going     PT LONG TERM GOAL #4   Title FOTO score will improve to less than 40% impaired.    Baseline 1 % improved    Status On-going     PT LONG TERM GOAL #5   Title Pt will be able to complete ADLs, light housework for up to 15 min in standing with no increase in back pain.    Status On-going               Plan - May 20, 2016 1445    Clinical Impression Statement Patient has missed a few appts, she has been dealing with a sinus infection. She feels she initially was good about doing her HEP, felt stronger, but now has had a mild set back.  She is stronger in her LEs, trunk ROM still causes pain, in particular, rotating Rt and extension.  She would like to cont for 3 more visits then transition to our group PT program due to finances.    Rehab Potential Excellent   PT Frequency 2x / week   PT Duration 2 weeks   PT Treatment/Interventions ADLs/Self Care Home Management;Moist Heat;Therapeutic exercise;Ultrasound;Taping;Manual techniques;Balance training;Neuromuscular re-education;Electrical Stimulation;Cryotherapy;Iontophoresis 63m/ml Dexamethasone;Functional mobility training;Gait training;Stair training;Patient/family education;Passive range of motion   PT Next Visit Plan reinforce regular HEP,  Review standing hip, supine trunk stretches as tolerated, core in various positions, monitor dyspnea   PT Home Exercise Plan basic back: LTR, hamstring, knee to chest , abdominal isometric, DC bridge for now , standing 3 way hip, calf stretch    Consulted and Agree with Plan of Care Patient      Patient will benefit from skilled therapeutic intervention in order to improve the following deficits and impairments:  Decreased range of motion, Difficulty walking, Increased fascial restricitons, Obesity, Impaired UE functional use, Pain, Decreased activity tolerance, Decreased balance,  Impaired flexibility, Hypomobility, Postural dysfunction, Impaired sensation, Decreased strength, Decreased mobility  Visit Diagnosis: Difficulty in walking, not elsewhere classified  Muscle weakness (generalized)  Radiculopathy, lumbosacral region  Chronic low back pain with sciatica, sciatica laterality unspecified, unspecified back pain laterality       G-Codes - 003/29/20181439    Functional Assessment Tool Used (Outpatient Only) FOTO   Functional Limitation Mobility: Walking and moving around   Mobility: Walking and Moving Around Current Status [phone] At least 40 percent but less than 60 percent impaired, limited or restricted   Mobility: Walking and Moving Around Goal Status [id number] At least 20 percent but less than 40 percent impaired, limited or restricted      Problem List Patient Active Problem List   Diagnosis Date Noted  . Spondylolisthesis at L1-L2 level 03/18/2015  . Spondylosis with myelopathy 09/02/2014  . Cervical spondylosis with myelopathy 09/02/2014  . Anxiety 12/21/2012  . Chronic back pain 12/21/2012  . Insomnia 12/21/2012  . 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 Spinal stenosis, lumbar region, with neurogenic claudication 10/26/2012  . Spinal stenosis, thoracic 10/26/2012  . Umbilical hernia 010/62/6948 . OSA (obstructive sleep apnea) 11/12/2011  . Exogenous obesity 11/12/2011  . 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 Hypothyroidism 09/18/2009  . Diabetes mellitus without complication ([place] 054/62/7035 . Dyslipidemia 09/18/2009  . Anemia 09/18/2009  . Depression 09/18/2009  . Essential hypertension  09/18/2009  . GERD 09/18/2009  . Osteoarthritis 09/18/2009    Demika Langenderfer 05/04/2016, 2:50 PM  [facility] [address] [place], [place], [address] Phone: [phone]   Fax:  [phone]  Name: Katie Booker MRN: [id number] Date of Birth: Dec 20, 1945  Raeford Razor,  PT 05/04/16 2:50 PM Phone: [phone] Fax: [phone]

## 2016-05-06 ENCOUNTER — Ambulatory Visit: Payer: Medicare Other | Admitting: Physical Therapy

## 2016-05-06 DIAGNOSIS — G8929 Other chronic pain: Secondary | ICD-10-CM | POA: Diagnosis not present

## 2016-05-06 DIAGNOSIS — M5417 Radiculopathy, lumbosacral region: Secondary | ICD-10-CM | POA: Diagnosis not present

## 2016-05-06 DIAGNOSIS — M5440 Lumbago with sciatica, unspecified side: Secondary | ICD-10-CM

## 2016-05-06 DIAGNOSIS — R262 Difficulty in walking, not elsewhere classified: Secondary | ICD-10-CM

## 2016-05-06 DIAGNOSIS — M6281 Muscle weakness (generalized): Secondary | ICD-10-CM

## 2016-05-06 NOTE — Therapy (Signed)
[place], [place], [address] Phone: [phone]   Fax:  [phone]  Physical Therapy Treatment  Patient Details  Name: Katie Booker MRN: [id number] Date of Birth: December 22, 1945 Referring Provider: Dr. Ellene Route   Encounter Date: 05/06/2016      PT End of Session - 05/06/16 1342    Visit Number 11   Number of Visits 16   Date for PT Re-Evaluation 05/21/16   PT Start Time 1330   PT Stop Time 1440   PT Time Calculation (min) 70 min   Activity Tolerance Patient tolerated treatment well;Patient limited by fatigue   Behavior During Therapy [facility] for tasks assessed/performed      Past Medical History:  Diagnosis Date  . Anal fissure   . ANEMIA-NOS 09/18/2009   takes Ferrous Sulfate daily  . Anxiety    takes Xanax prn sleep and anxiety  . Cancer ([place])    basal cell left cheek  . Chronic back pain    scoliosis/stenosis/spondylosis  . DEPRESSION 09/18/2009   takes Celexa daily  . DIABETES MELLITUS, TYPE II 09/18/2009   takes Amaryl daily and Metformin  . GERD 09/18/2009   takes Omeprazole daily  . Headache(784.0)    sinus HA  . Hemorrhoids   . Histoplasmosis    hx of  . History of bronchitis    last time around 1990  . History of colon polyps   . HYPERLIPIDEMIA 09/18/2009   takes Lipitor daily  . HYPERTENSION 09/18/2009   takes Atenolol daily and Lisinopril  . HYPOTHYROIDISM 09/18/2009  . Insomnia    takes trazodone and ambien nightly  . Joint pain   . Joint swelling   . Morbid obesity ([place])   . Obesity   . OSA (obstructive sleep apnea)    has a CPAP and sleep study report in epic from 2009  . OSTEOARTHRITIS 09/18/2009  . Peripheral neuropathy ([facility])    takes Lyrica daily  . Shortness of breath    with exertion  . Skin spots, red    right above both elbows;pt states always there  . Spondylolisthesis at L1-L2 level   . Umbilical hernia   . Urinary frequency   . Urinary urgency     Past  Surgical History:  Procedure Laterality Date  . ANTERIOR CERVICAL DECOMP/DISCECTOMY FUSION N/A 09/02/2014   Procedure: Cervical five- six  Anterior cervical decompression fusion;  Surgeon: Kristeen Miss, MD;  Location: [place] [facility];  Service: Neurosurgery;  Laterality: N/A;  C5-6 Anterior cervical decompression/diskectomy/fusion  . BACK SURGERY    . CARPAL TUNNEL RELEASE     RIGHT      2016   . COLONOSCOPY  2007  . ESOPHAGOGASTRODUODENOSCOPY  2007  . HEMORRHOID SURGERY  03/23/2011   Procedure: HEMORRHOIDECTOMY;  Surgeon: Joyice Faster. Cornett, MD;  Location: [place];  Service: General;  Laterality: N/A;  lateral internal sphincterotomy and hemorrhoidectomy  . SPHINCTEROTOMY    . THORACIC DISCECTOMY N/A 12/15/2012   Procedure: Thoracic ten-eleven Thoracic laminectomy;  Surgeon: Kristeen Miss, MD;  Location: [place] [facility];  Service: Neurosurgery;  Laterality: N/A;  Thoracic ten-eleven Thoracic laminectomy  . tubal fulgaration  1991    There were no vitals filed for this visit.      Subjective Assessment - 05/06/16 1335    Subjective I pulled my back as I reached down to loosen my shoe.  My back hurt the rest of the day and night after the other day.  Currently in Pain? Yes   Pain Score --  with walking 4/10.     Pain Location Back   Pain Orientation Right;Left;Lower   Pain Descriptors / Indicators Aching   Pain Type Chronic pain   Pain Onset More than a month ago   Pain Frequency Intermittent              [facility] Adult PT Treatment/Exercise - 05/06/16 1354      Lumbar Exercises: Aerobic   Stationary Bike NuStep 6 min L4 UE and LE      Lumbar Exercises: Standing   Wall Slides 10 reps   Wall Slides Limitations small ROM, min knee pain R      Lumbar Exercises: Seated   Other Seated Lumbar Exercises seated lateral trunk flexion R/L. x 3 each      Knee/Hip Exercises: Stretches   Piriformis Stretch Both;2 reps;30 seconds   Gastroc Stretch Both;2 reps;30 seconds      Shoulder Exercises: Seated   Horizontal ABduction Strengthening;Both;12 reps   External Rotation Strengthening;Both;20 reps     Shoulder Exercises: Standing   Horizontal ABduction Strengthening;Both;15 reps   Theraband Level (Shoulder Horizontal ABduction) Level 1 (Yellow)   External Rotation Strengthening;Both;15 reps   Theraband Level (Shoulder External Rotation) Level 1 (Yellow)     Cryotherapy   Number Minutes Cryotherapy 15 Minutes   Cryotherapy Location Lumbar Spine   Type of Cryotherapy Ice pack     Electrical Stimulation   Electrical Stimulation Location lumbar    Electrical Stimulation Action IFC   Electrical Stimulation Parameters to tol seated   Electrical Stimulation Goals Pain                  PT Short Term Goals - 05/04/16 1447      PT SHORT TERM GOAL #1   Title Pt will be complete balance assessment and set goal.     Status Achieved     PT SHORT TERM GOAL #2   Title Pt will be I with HEP    Baseline inconsistent   Status Partially Met     PT SHORT TERM GOAL #3   Title Pt will be able to report min pain in low back with transfers, rolling over in bed or on therapy mat.    Baseline able to transfer on and off mat, supine to sit without c/o pain however SOB is most bothersome    Status On-going     PT SHORT TERM GOAL #4   Title Pt will be able to walk in grocery store with cart and report min fatigue, pain overall.    Baseline fatigues quickly   Status On-going           PT Long Term Goals - 05/04/16 1448      PT LONG TERM GOAL #1   Title Pt will be able to understand good body mechanics, posture and utilize at home for long term pain control.    Baseline needs reminders    Status Partially Met     PT LONG TERM GOAL #2   Title Pt will be able to use 3 self care strategies for home use (other than pain meds) to reduce pain.    Baseline shower, positioning (hard chair), does not tend to turn to HEP    Status Partially Met     PT  LONG TERM GOAL #3   Title Pt will increase hip strength to 4+/5 in abduction, extension to improve tolerance for gait with LRAD.  Baseline 3+/5 throughout  but pain in hip and low back    Status On-going     PT LONG TERM GOAL #4   Title FOTO score will improve to less than 40% impaired.    Baseline 1 % improved    Status On-going     PT LONG TERM GOAL #5   Title Pt will be able to complete ADLs, light housework for up to 15 min in standing with no increase in back pain.    Status On-going               Plan - 05/06/16 1435    Clinical Impression Statement Pt needed some encouragement today to complete standing exercises.  Good hip flexibility, legs fatigue quickly.  Used modalities post session to reduce post exercise pain increase.    PT Next Visit Plan reinforce regular HEP,  Review standing hip, supine trunk stretches as tolerated, core in various positions, monitor dyspnea. IFC and ice post   PT Home Exercise Plan basic back: LTR, hamstring, knee to chest , abdominal isometric, DC bridge for now , standing 3 way hip, calf stretch    Consulted and Agree with Plan of Care Patient     SENDING RX to [facility] FOR [facility] FOR HOME TENS UNIT    Patient will benefit from skilled therapeutic intervention in order to improve the following deficits and impairments:  Decreased range of motion, Difficulty walking, Increased fascial restricitons, Obesity, Impaired UE functional use, Pain, Decreased activity tolerance, Decreased balance, Impaired flexibility, Hypomobility, Postural dysfunction, Impaired sensation, Decreased strength, Decreased mobility  Visit Diagnosis: Difficulty in walking, not elsewhere classified  Muscle weakness (generalized)  Radiculopathy, lumbosacral region  Chronic low back pain with sciatica, sciatica laterality unspecified, unspecified back pain laterality     Problem List Patient Active Problem List   Diagnosis Date Noted  . Spondylolisthesis at L1-L2 level  03/18/2015  . Spondylosis with myelopathy 09/02/2014  . Cervical spondylosis with myelopathy 09/02/2014  . Anxiety 12/21/2012  . Chronic back pain 12/21/2012  . Insomnia 12/21/2012  . 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 Spinal stenosis, lumbar region, with neurogenic claudication 10/26/2012  . Spinal stenosis, thoracic 10/26/2012  . Umbilical hernia 17/00/1749  . OSA (obstructive sleep apnea) 11/12/2011  . Exogenous obesity 11/12/2011  . 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 Hypothyroidism 09/18/2009  . Diabetes mellitus without complication ([place]) 44/96/7591  . Dyslipidemia 09/18/2009  . Anemia 09/18/2009  . Depression 09/18/2009  . Essential hypertension 09/18/2009  . GERD 09/18/2009  . Osteoarthritis 09/18/2009    Derril Franek 05/06/2016, 2:38 PM  [facility] [address] [place], [place], [address] Phone: [phone]   Fax:  [phone]  Name: CLEMENCE LENGYEL MRN: [id number] Date of Birth: [birth year]  Raeford Razor, PT 05/06/16 2:38 PM Phone: [phone] Fax: [phone]

## 2016-05-11 ENCOUNTER — Ambulatory Visit: Payer: Medicare Other | Admitting: Physical Therapy

## 2016-05-14 ENCOUNTER — Ambulatory Visit: Payer: Medicare Other | Admitting: Physical Therapy

## 2016-05-14 ENCOUNTER — Telehealth: Payer: Self-pay | Admitting: Physical Therapy

## 2016-05-14 NOTE — Telephone Encounter (Signed)
Called patient regarding her missed appointment today at 9:30. She was sick can called to cancel her last appt on Wed.  I reminded her of her next appt.  Monday at 2:15.

## 2016-05-17 ENCOUNTER — Ambulatory Visit: Payer: Medicare Other | Admitting: Physical Therapy

## 2016-05-17 DIAGNOSIS — M5417 Radiculopathy, lumbosacral region: Secondary | ICD-10-CM

## 2016-05-17 DIAGNOSIS — R262 Difficulty in walking, not elsewhere classified: Secondary | ICD-10-CM | POA: Diagnosis not present

## 2016-05-17 DIAGNOSIS — M6281 Muscle weakness (generalized): Secondary | ICD-10-CM

## 2016-05-17 DIAGNOSIS — M5440 Lumbago with sciatica, unspecified side: Secondary | ICD-10-CM | POA: Diagnosis not present

## 2016-05-17 DIAGNOSIS — G8929 Other chronic pain: Secondary | ICD-10-CM | POA: Diagnosis not present

## 2016-05-17 NOTE — Therapy (Signed)
[place], [place], [address] Phone: [phone]   Fax:  [phone]  Physical Therapy Treatment  Patient Details  Name: Katie Booker MRN: [id number] Date of Birth: 1945-08-01 Referring Provider: Dr. Ellene Route   Encounter Date: 05/17/2016      PT End of Session - 05/17/16 1433    Visit Number 12   Number of Visits 16   Date for PT Re-Evaluation 05/21/16   PT Start Time 7544   PT Stop Time 1505   PT Time Calculation (min) 45 min   Activity Tolerance Patient tolerated treatment well   Behavior During Therapy [facility] for tasks assessed/performed      Past Medical History:  Diagnosis Date  . Anal fissure   . ANEMIA-NOS 09/18/2009   takes Ferrous Sulfate daily  . Anxiety    takes Xanax prn sleep and anxiety  . Cancer ([place])    basal cell left cheek  . Chronic back pain    scoliosis/stenosis/spondylosis  . DEPRESSION 09/18/2009   takes Celexa daily  . DIABETES MELLITUS, TYPE II 09/18/2009   takes Amaryl daily and Metformin  . GERD 09/18/2009   takes Omeprazole daily  . Headache(784.0)    sinus HA  . Hemorrhoids   . Histoplasmosis    hx of  . History of bronchitis    last time around 1990  . History of colon polyps   . HYPERLIPIDEMIA 09/18/2009   takes Lipitor daily  . HYPERTENSION 09/18/2009   takes Atenolol daily and Lisinopril  . HYPOTHYROIDISM 09/18/2009  . Insomnia    takes trazodone and ambien nightly  . Joint pain   . Joint swelling   . Morbid obesity ([place])   . Obesity   . OSA (obstructive sleep apnea)    has a CPAP and sleep study report in epic from 2009  . OSTEOARTHRITIS 09/18/2009  . Peripheral neuropathy (HCC)    takes Lyrica daily  . Shortness of breath    with exertion  . Skin spots, red    right above both elbows;pt states always there  . Spondylolisthesis at L1-L2 level   . Umbilical hernia   . Urinary frequency   . Urinary urgency     Past Surgical History:  Procedure  Laterality Date  . ANTERIOR CERVICAL DECOMP/DISCECTOMY FUSION N/A 09/02/2014   Procedure: Cervical five- six  Anterior cervical decompression fusion;  Surgeon: Kristeen Miss, MD;  Location: [place] [facility];  Service: Neurosurgery;  Laterality: N/A;  C5-6 Anterior cervical decompression/diskectomy/fusion  . BACK SURGERY    . CARPAL TUNNEL RELEASE     RIGHT      2016   . COLONOSCOPY  2007  . ESOPHAGOGASTRODUODENOSCOPY  2007  . HEMORRHOID SURGERY  03/23/2011   Procedure: HEMORRHOIDECTOMY;  Surgeon: Joyice Faster. Cornett, MD;  Location: [place];  Service: General;  Laterality: N/A;  lateral internal sphincterotomy and hemorrhoidectomy  . SPHINCTEROTOMY    . THORACIC DISCECTOMY N/A 12/15/2012   Procedure: Thoracic ten-eleven Thoracic laminectomy;  Surgeon: Kristeen Miss, MD;  Location: [place] [facility];  Service: Neurosurgery;  Laterality: N/A;  Thoracic ten-eleven Thoracic laminectomy  . tubal fulgaration  1991    There were no vitals filed for this visit.      Subjective Assessment - 05/17/16 1424    Subjective Pt slightly dizzy and nauseous today.  She feels its due to something she ate.  Has been taking it easy, this gives her less pain. Less out of breath than  she was.  No pain in sitting.    Currently in Pain? No/denies            [facility] Adult PT Treatment/Exercise - 05/17/16 1430      Self-Care   Self-Care ADL's;Posture;Other Self-Care Comments   ADL's grooming, toe nail clipping to preserve back    Posture standing    Other Self-Care Comments  plan, DC , home TENC coverage     Lumbar Exercises: Aerobic   Stationary Bike Nu Step L3 UE and LE for 8 min      Lumbar Exercises: Standing   Heel Raises 10 reps   Heel Raises Limitations holding ball    Other Standing Lumbar Exercises ball press with ball on her rollator walker for core activation     Lumbar Exercises: Seated   Sit to Stand 20 reps   Sit to Stand Limitations used ball and circle to challenge balance and UE  strength    Other Seated Lumbar Exercises sit to stand and lift (shoulder flexion), circle pressing outward while standing 2 rest breaks                PT Education - 05/17/16 1506    Education provided Yes   Education Details plan, group class   Person(s) Educated Patient   Methods Explanation   Comprehension Verbalized understanding          PT Short Term Goals - 05/04/16 1447      PT SHORT TERM GOAL #1   Title Pt will be complete balance assessment and set goal.     Status Achieved     PT SHORT TERM GOAL #2   Title Pt will be I with HEP    Baseline inconsistent   Status Partially Met     PT SHORT TERM GOAL #3   Title Pt will be able to report min pain in low back with transfers, rolling over in bed or on therapy mat.    Baseline able to transfer on and off mat, supine to sit without c/o pain however SOB is most bothersome    Status On-going     PT SHORT TERM GOAL #4   Title Pt will be able to walk in grocery store with cart and report min fatigue, pain overall.    Baseline fatigues quickly   Status On-going           PT Long Term Goals - 05/04/16 1448      PT LONG TERM GOAL #1   Title Pt will be able to understand good body mechanics, posture and utilize at home for long term pain control.    Baseline needs reminders    Status Partially Met     PT LONG TERM GOAL #2   Title Pt will be able to use 3 self care strategies for home use (other than pain meds) to reduce pain.    Baseline shower, positioning (hard chair), does not tend to turn to HEP    Status Partially Met     PT LONG TERM GOAL #3   Title Pt will increase hip strength to 4+/5 in abduction, extension to improve tolerance for gait with LRAD.    Baseline 3+/5 throughout  but pain in hip and low back    Status On-going     PT LONG TERM GOAL #4   Title FOTO score will improve to less than 40% impaired.    Baseline 1 % improved    Status On-going  PT LONG TERM GOAL #5   Title Pt will  be able to complete ADLs, light housework for up to 15 min in standing with no increase in back pain.    Status On-going               Plan - 05/17/16 1505    Clinical Impression Statement Patient has missed the past 2 appts but is doing well despite that.  Worked on standing core and endurance today.  She plans to come 1 more visit to get a Home TENS and the do the group class (cash based).She may also consider the [employer].      PT Next Visit Plan TENS and sign up for group class    PT Home Exercise Plan basic back: LTR, hamstring, knee to chest , abdominal isometric, DC bridge for now , standing 3 way hip, calf stretch    Consulted and Agree with Plan of Care Patient      Patient will benefit from skilled therapeutic intervention in order to improve the following deficits and impairments:  Decreased range of motion, Difficulty walking, Increased fascial restricitons, Obesity, Impaired UE functional use, Pain, Decreased activity tolerance, Decreased balance, Impaired flexibility, Hypomobility, Postural dysfunction, Impaired sensation, Decreased strength, Decreased mobility  Visit Diagnosis: Difficulty in walking, not elsewhere classified  Muscle weakness (generalized)  Radiculopathy, lumbosacral region  Chronic low back pain with sciatica, sciatica laterality unspecified, unspecified back pain laterality     Problem List Patient Active Problem List   Diagnosis Date Noted  . Spondylolisthesis at L1-L2 level 03/18/2015  . Spondylosis with myelopathy 09/02/2014  . Cervical spondylosis with myelopathy 09/02/2014  . Anxiety 12/21/2012  . Chronic back pain 12/21/2012  . Insomnia 12/21/2012  . 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 Spinal stenosis, lumbar region, with neurogenic claudication 10/26/2012  . Spinal stenosis, thoracic 10/26/2012  . Umbilical hernia 82/07/154  . OSA (obstructive sleep apnea) 11/12/2011  . Exogenous obesity 11/12/2011  .  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 Hypothyroidism 09/18/2009  . Diabetes mellitus without complication ([place]) 15/37/9432  . Dyslipidemia 09/18/2009  . Anemia 09/18/2009  . Depression 09/18/2009  . Essential hypertension 09/18/2009  . GERD 09/18/2009  . Osteoarthritis 09/18/2009    , 05/17/2016, 3:33 PM  [facility] [address] [place], [place], [address] Phone: [phone]   Fax:  [phone]  Name: Katie Booker MRN: [id number] Date of Birth: Dec 14, 1945  Raeford Razor, PT 05/17/16 3:34 PM Phone: [phone] Fax: [phone]

## 2016-05-19 DIAGNOSIS — H35423 Microcystoid degeneration of retina, bilateral: Secondary | ICD-10-CM | POA: Diagnosis not present

## 2016-05-19 DIAGNOSIS — H353123 Nonexudative age-related macular degeneration, left eye, advanced atrophic without subfoveal involvement: Secondary | ICD-10-CM | POA: Diagnosis not present

## 2016-05-19 DIAGNOSIS — H353211 Exudative age-related macular degeneration, right eye, with active choroidal neovascularization: Secondary | ICD-10-CM | POA: Diagnosis not present

## 2016-05-19 DIAGNOSIS — H35433 Paving stone degeneration of retina, bilateral: Secondary | ICD-10-CM | POA: Diagnosis not present

## 2016-05-20 ENCOUNTER — Ambulatory Visit: Payer: Medicare Other | Admitting: Physical Therapy

## 2016-05-24 ENCOUNTER — Ambulatory Visit: Payer: Medicare Other | Attending: Neurological Surgery | Admitting: Physical Therapy

## 2016-05-24 ENCOUNTER — Encounter: Payer: Self-pay | Admitting: Internal Medicine

## 2016-05-24 DIAGNOSIS — M5417 Radiculopathy, lumbosacral region: Secondary | ICD-10-CM

## 2016-05-24 DIAGNOSIS — M6281 Muscle weakness (generalized): Secondary | ICD-10-CM | POA: Diagnosis not present

## 2016-05-24 DIAGNOSIS — G8929 Other chronic pain: Secondary | ICD-10-CM | POA: Diagnosis not present

## 2016-05-24 DIAGNOSIS — R262 Difficulty in walking, not elsewhere classified: Secondary | ICD-10-CM | POA: Diagnosis not present

## 2016-05-24 DIAGNOSIS — M5440 Lumbago with sciatica, unspecified side: Secondary | ICD-10-CM | POA: Insufficient documentation

## 2016-05-24 NOTE — Therapy (Signed)
[place], [place], [address] Phone: [phone]   Fax:  [phone]  Physical Therapy Treatment, Recertification and Discharge Patient Details  Name: Katie Booker MRN: [id number] Date of Birth: 05-06-1945 Referring Provider: Dr. Ellene Route   Encounter Date: 05/24/2016      PT End of Session - 05/24/16 0932    Visit Number 13   Number of Visits 16   Date for PT Re-Evaluation 05/24/16   PT Start Time 0845   PT Stop Time 0932   PT Time Calculation (min) 47 min   Activity Tolerance Patient tolerated treatment well   Behavior During Therapy [facility] for tasks assessed/performed      Past Medical History:  Diagnosis Date  . Anal fissure   . ANEMIA-NOS 09/18/2009   takes Ferrous Sulfate daily  . Anxiety    takes Xanax prn sleep and anxiety  . Cancer ([place])    basal cell left cheek  . Chronic back pain    scoliosis/stenosis/spondylosis  . DEPRESSION 09/18/2009   takes Celexa daily  . DIABETES MELLITUS, TYPE II 09/18/2009   takes Amaryl daily and Metformin  . GERD 09/18/2009   takes Omeprazole daily  . Headache(784.0)    sinus HA  . Hemorrhoids   . Histoplasmosis    hx of  . History of bronchitis    last time around 1990  . History of colon polyps   . HYPERLIPIDEMIA 09/18/2009   takes Lipitor daily  . HYPERTENSION 09/18/2009   takes Atenolol daily and Lisinopril  . HYPOTHYROIDISM 09/18/2009  . Insomnia    takes trazodone and ambien nightly  . Joint pain   . Joint swelling   . Morbid obesity ([place])   . Obesity   . OSA (obstructive sleep apnea)    has a CPAP and sleep study report in epic from 2009  . OSTEOARTHRITIS 09/18/2009  . Peripheral neuropathy (HCC)    takes Lyrica daily  . Shortness of breath    with exertion  . Skin spots, red    right above both elbows;pt states always there  . Spondylolisthesis at L1-L2 level   . Umbilical hernia   . Urinary frequency   . Urinary urgency     Past  Surgical History:  Procedure Laterality Date  . ANTERIOR CERVICAL DECOMP/DISCECTOMY FUSION N/A 09/02/2014   Procedure: Cervical five- six  Anterior cervical decompression fusion;  Surgeon: Kristeen Miss, MD;  Location: [place] [facility];  Service: Neurosurgery;  Laterality: N/A;  C5-6 Anterior cervical decompression/diskectomy/fusion  . BACK SURGERY    . CARPAL TUNNEL RELEASE     RIGHT      2016   . COLONOSCOPY  2007  . ESOPHAGOGASTRODUODENOSCOPY  2007  . HEMORRHOID SURGERY  03/23/2011   Procedure: HEMORRHOIDECTOMY;  Surgeon: Joyice Faster. Cornett, MD;  Location: [place];  Service: General;  Laterality: N/A;  lateral internal sphincterotomy and hemorrhoidectomy  . SPHINCTEROTOMY    . THORACIC DISCECTOMY N/A 12/15/2012   Procedure: Thoracic ten-eleven Thoracic laminectomy;  Surgeon: Kristeen Miss, MD;  Location: [place] [facility];  Service: Neurosurgery;  Laterality: N/A;  Thoracic ten-eleven Thoracic laminectomy  . tubal fulgaration  1991    There were no vitals filed for this visit.      Subjective Assessment - 05/24/16 0903    Subjective Pt had injection in her eye has had alot of pain and swelling.  Think she pulled her back doing something. Today is my last day.  Currently in Pain? Yes   Pain Score 4    Pain Location Back   Pain Orientation Right;Left;Lower   Pain Descriptors / Indicators Aching   Pain Type Chronic pain   Pain Onset More than a month ago   Pain Frequency Intermittent   Aggravating Factors  sometimes not much at all    Pain Relieving Factors sitting, rest, IFC and cold             [facility] PT Assessment - 05/24/16 0903      Cognition   Overall Cognitive Status Within Functional Limits for tasks assessed     Observation/Other Assessments   Focus on Therapeutic Outcomes (FOTO)  58%              [facility] Adult PT Treatment/Exercise - 05/24/16 0905      Self-Care   Other Self-Care Comments  TENS unit indication and precautions and contraindications  and usage      Lumbar Exercises: Aerobic   Stationary Bike 8 min L5 UE and LE      Electrical Stimulation   Electrical Stimulation Location lumbar    Electrical Stimulation Action TENS varied settings for education    Electrical Stimulation Parameters to tolerance    Electrical Stimulation Goals Pain                PT Education - 05/24/16 0945    Education provided Yes   Education Details Home TENS unit    Person(s) Educated Patient   Methods Explanation;Demonstration;Handout;Verbal cues   Comprehension Verbalized understanding;Returned demonstration          PT Short Term Goals - 05/24/16 0946      PT SHORT TERM GOAL #1   Title Pt will be complete balance assessment and set goal.     Status Achieved     PT SHORT TERM GOAL #2   Title Pt will be I with HEP    Status Achieved     PT SHORT TERM GOAL #3   Title Pt will be able to report min pain in low back with transfers, rolling over in bed or on therapy mat.    Status Achieved     PT SHORT TERM GOAL #4   Title Pt will be able to walk in grocery store with cart and report min fatigue, pain overall.    Baseline improved endurance and someytimes pain increases    Status Partially Met           PT Long Term Goals - 05/24/16 0947      PT LONG TERM GOAL #1   Title Pt will be able to understand good body mechanics, posture and utilize at home for long term pain control.    Baseline needs reminders at times    Status Partially Met     PT LONG TERM GOAL #2   Title Pt will be able to use 3 self care strategies for home use (other than pain meds) to reduce pain.    Status Achieved     PT LONG TERM GOAL #3   Title Pt will increase hip strength to 4+/5 in abduction, extension to improve tolerance for gait with LRAD.    Baseline 3+/5 to 4/5  throughout  but pain in hip and low back    Status Partially Met     PT LONG TERM GOAL #4   Title FOTO score will improve to less than 40% impaired.    Baseline 56%  limited  Status Not Met     PT LONG TERM GOAL #5   Title Pt will be able to complete ADLs, light housework for up to 15 min in standing with no increase in back pain.    Baseline has good days and bad    Status Partially Met               Plan - 06-19-2016 0946    Clinical Impression Statement Patient has       Patient will benefit from skilled therapeutic intervention in order to improve the following deficits and impairments:     Visit Diagnosis: Difficulty in walking, not elsewhere classified  Muscle weakness (generalized)  Radiculopathy, lumbosacral region  Chronic low back pain with sciatica, sciatica laterality unspecified, unspecified back pain laterality       G-Codes - 06-19-16 0949    Functional Assessment Tool Used (Outpatient Only) FOTO   Functional Limitation Mobility: Walking and moving around   Mobility: Walking and Moving Around Current Status [phone]) At least 40 percent but less than 60 percent impaired, limited or restricted   Mobility: Walking and Moving Around Goal Status [phone]) At least 40 percent but less than 60 percent impaired, limited or restricted   Mobility: Walking and Moving Around Discharge Status [phone]) At least 40 percent but less than 60 percent impaired, limited or restricted      Problem List Patient Active Problem List   Diagnosis Date Noted  . Spondylolisthesis at L1-L2 level 03/18/2015  . Spondylosis with myelopathy 09/02/2014  . Cervical spondylosis with myelopathy 09/02/2014  . Anxiety 12/21/2012  . Chronic back pain 12/21/2012  . Insomnia 12/21/2012  . 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 Spinal stenosis, lumbar region, with neurogenic claudication 10/26/2012  . Spinal stenosis, thoracic 10/26/2012  . Umbilical hernia 90/93/1121  . OSA (obstructive sleep apnea) 11/12/2011  . Exogenous obesity 11/12/2011  . 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 Hypothyroidism 09/18/2009  . Diabetes  mellitus without complication ([place]) 62/44/6950  . Dyslipidemia 09/18/2009  . Anemia 09/18/2009  . Depression 09/18/2009  . Essential hypertension 09/18/2009  . GERD 09/18/2009  . Osteoarthritis 09/18/2009    PAA,JENNIFER June 19, 2016, 9:56 AM  [facility] [address] [place], [place], [address] Phone: [phone]   Fax:  [phone]  Name: TAVARIA MACKINS MRN: [id number] Date of Birth: 1945/09/19  PHYSICAL THERAPY DISCHARGE SUMMARY  Visits from Start of Care: 13  Current functional level related to goals / functional outcomes: See above for most recent info   Remaining deficits: Hip and core weakness, back pain, endurance (although improved)   Education / Equipment: TENS unit, HEP  Plan: Patient agrees to discharge.  Patient goals were partially met. Patient is being discharged due to lack of progress.  ?????  Patient plans to continue with our group exercise classes for motivation and accountibility.   Raeford Razor, PT 06-19-2016 9:59 AM Phone: [phone] Fax: [phone]

## 2016-05-31 ENCOUNTER — Telehealth: Payer: Self-pay | Admitting: Internal Medicine

## 2016-05-31 MED ORDER — HYDROCODONE-ACETAMINOPHEN 7.5-325 MG PO TABS: 1.0000 | ORAL_TABLET | Freq: Four times a day (QID) | ORAL | 0 refills | Status: DC | PRN

## 2016-05-31 NOTE — Telephone Encounter (Signed)
Rx printed awaiting to be signed.  

## 2016-05-31 NOTE — Telephone Encounter (Signed)
Pt needs new rx hydrocodone °

## 2016-06-01 NOTE — Telephone Encounter (Signed)
Pt notified Rx ready for pickup. Rx printed and signed.  

## 2016-06-12 ENCOUNTER — Other Ambulatory Visit: Payer: Self-pay | Admitting: Internal Medicine

## 2016-06-30 ENCOUNTER — Telehealth: Payer: Self-pay | Admitting: Internal Medicine

## 2016-06-30 NOTE — Telephone Encounter (Signed)
Pt need new Rx for hydrocodone-acetaminophen   Pt is aware of 3 business days for refills and someone will call when ready for pick up and also that Dr. Kirtland BouchardK is out of the and will return on Friday 07/02/16.

## 2016-07-02 ENCOUNTER — Other Ambulatory Visit: Payer: Self-pay

## 2016-07-02 MED ORDER — HYDROCODONE-ACETAMINOPHEN 7.5-325 MG PO TABS: 1.0000 | ORAL_TABLET | Freq: Four times a day (QID) | ORAL | 0 refills | Status: DC | PRN

## 2016-07-02 NOTE — Telephone Encounter (Signed)
Rx approved, printer awaiting doctor K signiture.

## 2016-07-02 NOTE — Telephone Encounter (Signed)
Okay for refill today.

## 2016-07-02 NOTE — Telephone Encounter (Signed)
Pt calling to check the status of the refill

## 2016-07-02 NOTE — Telephone Encounter (Signed)
Rx ready for pick up at the front desk. Patient is aware

## 2016-07-12 ENCOUNTER — Telehealth: Payer: Self-pay | Admitting: Internal Medicine

## 2016-07-12 NOTE — Telephone Encounter (Signed)
° ° °  Pt call to say pharmacy would like a call concerning the below med. Saidthey have questions about how the RX was written   Pt said the pharmacy has been trying to speak with someone about the RX    [phone]

## 2016-07-13 DIAGNOSIS — H353123 Nonexudative age-related macular degeneration, left eye, advanced atrophic without subfoveal involvement: Secondary | ICD-10-CM | POA: Diagnosis not present

## 2016-07-13 DIAGNOSIS — H35372 Puckering of macula, left eye: Secondary | ICD-10-CM | POA: Diagnosis not present

## 2016-07-13 DIAGNOSIS — H35423 Microcystoid degeneration of retina, bilateral: Secondary | ICD-10-CM | POA: Diagnosis not present

## 2016-07-13 DIAGNOSIS — H353211 Exudative age-related macular degeneration, right eye, with active choroidal neovascularization: Secondary | ICD-10-CM | POA: Diagnosis not present

## 2016-07-13 NOTE — Telephone Encounter (Signed)
Pharmacy want to ensure that PCP is aware that pt is taking Xanax, lyrica and Norco that these medications cause pt to be in an increased rick for CNS depression.  Noted and will send PCP a FYI

## 2016-07-14 ENCOUNTER — Other Ambulatory Visit: Payer: Self-pay

## 2016-07-14 MED ORDER — ALPRAZOLAM 0.5 MG PO TABS: 0.2500 mg | ORAL_TABLET | Freq: Three times a day (TID) | ORAL | 3 refills | Status: DC

## 2016-07-14 NOTE — Telephone Encounter (Signed)
Suggest to patient that she decrease alprazolam to 1 half tablet 3 times daily.  We'll discuss further taper at next office visit

## 2016-07-14 NOTE — Telephone Encounter (Signed)
Spoke with pt and advised. She will try reducing and see how that works. Pharmacy needs new rx as they were refusing to fill with previous sig.  New RX printed and given to Dr. Kirtland BouchardK to sign.

## 2016-07-16 ENCOUNTER — Other Ambulatory Visit: Payer: Self-pay | Admitting: Internal Medicine

## 2016-07-26 ENCOUNTER — Ambulatory Visit: Payer: Medicare Other | Admitting: Internal Medicine

## 2016-08-02 ENCOUNTER — Telehealth: Payer: Self-pay | Admitting: Internal Medicine

## 2016-08-02 ENCOUNTER — Encounter: Payer: Self-pay | Admitting: Internal Medicine

## 2016-08-02 ENCOUNTER — Ambulatory Visit (INDEPENDENT_AMBULATORY_CARE_PROVIDER_SITE_OTHER): Payer: Medicare Other | Admitting: Internal Medicine

## 2016-08-02 ENCOUNTER — Other Ambulatory Visit: Payer: Self-pay | Admitting: Internal Medicine

## 2016-08-02 VITALS — BP 142/78 | HR 69 | Temp 98.6°F | Ht 61.0 in | Wt 195.6 lb

## 2016-08-02 DIAGNOSIS — E038 Other specified hypothyroidism: Secondary | ICD-10-CM

## 2016-08-02 DIAGNOSIS — M150 Primary generalized (osteo)arthritis: Secondary | ICD-10-CM

## 2016-08-02 DIAGNOSIS — E119 Type 2 diabetes mellitus without complications: Secondary | ICD-10-CM | POA: Diagnosis not present

## 2016-08-02 DIAGNOSIS — M549 Dorsalgia, unspecified: Secondary | ICD-10-CM | POA: Diagnosis not present

## 2016-08-02 DIAGNOSIS — I10 Essential (primary) hypertension: Secondary | ICD-10-CM

## 2016-08-02 DIAGNOSIS — M8949 Other hypertrophic osteoarthropathy, multiple sites: Secondary | ICD-10-CM

## 2016-08-02 DIAGNOSIS — G8929 Other chronic pain: Secondary | ICD-10-CM | POA: Diagnosis not present

## 2016-08-02 DIAGNOSIS — M159 Polyosteoarthritis, unspecified: Secondary | ICD-10-CM

## 2016-08-02 LAB — TSH: TSH: 0.35 u[IU]/mL (ref 0.35–4.50)

## 2016-08-02 LAB — HEMOGLOBIN A1C: HEMOGLOBIN A1C: 6.6 % — AB (ref 4.6–6.5)

## 2016-08-02 MED ORDER — HYDROCORTISONE 2.5 % EX CREA: 1.0000 "application " | TOPICAL_CREAM | Freq: Two times a day (BID) | CUTANEOUS | 2 refills | Status: DC

## 2016-08-02 MED ORDER — HYDROCODONE-ACETAMINOPHEN 7.5-325 MG PO TABS: 1.0000 | ORAL_TABLET | Freq: Four times a day (QID) | ORAL | 0 refills | Status: DC | PRN

## 2016-08-02 NOTE — Patient Instructions (Addendum)
WE NOW OFFER   [facility] FAST TRACK!!!  SAME DAY Appointments for ACUTE CARE  Such as: Sprains, Injuries, cuts, abrasions, rashes, muscle pain, joint pain, back pain Colds, flu, sore throats, headache, allergies, cough, fever  Ear pain, sinus and eye infections Abdominal pain, nausea, vomiting, diarrhea, upset stomach Animal/insect bites  3 Easy Ways to Schedule: Walk-In Scheduling Call in scheduling Mychart Sign-up: [email or web address]    Limit your sodium (Salt) intake   Please check your hemoglobin A1c every 3 months    It is important that you exercise regularly, at least 20 minutes 3 to 4 times per week.  If you develop chest pain or shortness of breath seek  medical attention.  You need to lose weight.  Consider a lower calorie diet and regular exercise.     

## 2016-08-02 NOTE — Telephone Encounter (Signed)
Rx printed out

## 2016-08-02 NOTE — Telephone Encounter (Signed)
Patient has an appt at 315pm and is wanting to pick up her Rx refill for HYDROcodone-acetaminophen (NORCO) 7.5-325 MG tablet.

## 2016-08-02 NOTE — Progress Notes (Signed)
Subjective:    Patient ID: Katie Booker, female    DOB: Jun 19, 1945, 71 y.o.   MRN: [id number]  HPI  Lab Results  Component Value Date   HGBA1C 6.9 (H) 04/19/2016    BP Readings from Last 3 Encounters:  08/02/16 (!) 142/78  04/23/16 120/68  04/01/16 134/74    Wt Readings from Last 3 Encounters:  08/02/16 195 lb 9.6 oz (88.7 kg)  04/23/16 203 lb (92.1 kg)  01/19/16 203 lb 4 oz (1.64 kg)   71 year old patient who is seen today for follow-up.  She has essential hypertension.  She has a history of type 2 diabetes that has been well-controlled. She has a history of anxiety and mild depression.  She has recently lost her mother at age 53 and has had some increase in anxiety and depression. Last visit,  Alprazolam was down titrated and this was discussed at this visit.  No titration.  At this time. She has significant arthritis which has been stable. Denies any cardiopulmonary complaints.  She is asking about a shingles vaccine.  Last visit was noted have an elevated TSH and she has had some nice weight loss since titration of levothyroxine.  She generally feels well today  Past Medical History:  Diagnosis Date  . Anal fissure   . ANEMIA-NOS 09/18/2009   takes Ferrous Sulfate daily  . Anxiety    takes Xanax prn sleep and anxiety  . Cancer (HCC)    basal cell left cheek  . Chronic back pain    scoliosis/stenosis/spondylosis  . DEPRESSION 09/18/2009   takes Celexa daily  . DIABETES MELLITUS, TYPE II 09/18/2009   takes Amaryl daily and Metformin  . GERD 09/18/2009   takes Omeprazole daily  . Headache(784.0)    sinus HA  . Hemorrhoids   . Histoplasmosis    hx of  . History of bronchitis    last time around 1990  . History of colon polyps   . HYPERLIPIDEMIA 09/18/2009   takes Lipitor daily  . HYPERTENSION 09/18/2009   takes Atenolol daily and Lisinopril  . HYPOTHYROIDISM 09/18/2009  . Insomnia    takes trazodone and ambien nightly  . Joint pain   . Joint swelling     . Morbid obesity (HCC)   . Obesity   . OSA (obstructive sleep apnea)    has a CPAP and sleep study report in epic from 2009  . OSTEOARTHRITIS 09/18/2009  . Peripheral neuropathy    takes Lyrica daily  . Shortness of breath    with exertion  . Skin spots, red    right above both elbows;pt states always there  . Spondylolisthesis at L1-L2 level   . Umbilical hernia   . Urinary frequency   . Urinary urgency      Social History   Social History  . Marital status: Single    Spouse name: N/A  . Number of children: N/A  . Years of education: N/A   Occupational History  . retired    Social History Main Topics  . Smoking status: Former Smoker    Packs/day: 1.00    Years: 20.00    Types: Cigarettes    Quit date: 02/23/2004  . Smokeless tobacco: Never Used     Comment: quit 2007  . Alcohol use 0.0 oz/week     Comment: rare  . Drug use: No  . Sexual activity: No   Other Topics Concern  . Not on file   Social History Narrative  .  No narrative on file    Past Surgical History:  Procedure Laterality Date  . ANTERIOR CERVICAL DECOMP/DISCECTOMY FUSION N/A 09/02/2014   Procedure: Cervical five- six  Anterior cervical decompression fusion;  Surgeon: Barnett AbuHenry Elsner, MD;  Location: [facility];  Service: Neurosurgery;  Laterality: N/A;  C5-6 Anterior cervical decompression/diskectomy/fusion  . BACK SURGERY    . CARPAL TUNNEL RELEASE     RIGHT      2016   . COLONOSCOPY  2007  . ESOPHAGOGASTRODUODENOSCOPY  2007  . HEMORRHOID SURGERY  03/23/2011   Procedure: HEMORRHOIDECTOMY;  Surgeon: Clovis Puhomas A. Cornett, MD;  Location: [facility];  Service: General;  Laterality: N/A;  lateral internal sphincterotomy and hemorrhoidectomy  . SPHINCTEROTOMY    . THORACIC DISCECTOMY N/A 12/15/2012   Procedure: Thoracic ten-eleven Thoracic laminectomy;  Surgeon: Barnett AbuHenry Elsner, MD;  Location: [facility];  Service: Neurosurgery;  Laterality: N/A;  Thoracic ten-eleven Thoracic laminectomy  .  tubal fulgaration  1991    Family History  Problem Relation Age of Onset  . Diabetes Father   . COPD Father   . Cancer Maternal Grandfather        stomach    Allergies  Allergen Reactions  . Duraprep [Tersaseptic] Itching and Rash    Irritation everywhere prep was used  . Hydromorphone Hcl     Makes crazy  . Morphine And Related Other (See Comments)    hallucinations    Current Outpatient Prescriptions on File Prior to Visit  Medication Sig Dispense Refill  . ALPRAZolam (XANAX) 0.5 MG tablet Take 0.5 tablets (0.25 mg total) by mouth 3 (three) times daily. 45 tablet 3  . atenolol (TENORMIN) 25 MG tablet TAKE 1 TABLET BY MOUTH  DAILY 90 tablet 1  . atorvastatin (LIPITOR) 40 MG tablet TAKE 1 TABLET BY MOUTH ONCE DAILY 90 tablet 3  . Calcium Carb-Cholecalciferol (CALCIUM 600/VITAMIN D3 PO) Take 1 tablet by mouth 2 (two) times daily.    . Carboxymethylcellulose Sodium (LUBRICANT EYE DROPS OP) Apply 2 drops to eye 4 (four) times daily as needed.    . citalopram (CELEXA) 40 MG tablet TAKE 1 TABLET BY MOUTH  DAILY 90 tablet 1  . docusate sodium (COLACE) 100 MG capsule Take 100 mg by mouth 2 (two) times daily.    Marland Kitchen. estradiol (ESTRACE) 0.1 MG/GM vaginal cream Place 1 Applicatorful vaginally 3 (three) times a week. 42.5 g 12  . gemfibrozil (LOPID) 600 MG tablet TAKE 1 TABLET BY MOUTH TWO  TIMES DAILY BEFORE MEALS 180 tablet 1  . Lancets (ONETOUCH ULTRASOFT) lancets Use daily 100 each 6  . levothyroxine (SYNTHROID, LEVOTHROID) 175 MCG tablet Take 1 tablet (175 mcg total) by mouth daily before breakfast. 90 tablet 5  . lisinopril (PRINIVIL,ZESTRIL) 10 MG tablet TAKE 1 TABLET BY MOUTH ONCE DAILY 90 tablet 1  . loratadine (CLARITIN) 10 MG tablet Take 10 mg by mouth daily as needed for allergies.    . meloxicam (MOBIC) 15 MG tablet TAKE 1 TABLET BY MOUTH  DAILY 90 tablet 1  . metFORMIN (GLUCOPHAGE) 1000 MG tablet TAKE 1 TABLET BY MOUTH TWO  TIMES DAILY WITH MEALS 180 tablet 1  . methocarbamol  (ROBAXIN) 500 MG tablet Take 500 mg by mouth every 6 (six) hours as needed for muscle spasms.    . Multiple Vitamin (MULTIVITAMIN) tablet Take 1 tablet by mouth daily.    . Omega-3 Fatty Acids (FISH OIL) 1200 MG CAPS Take 2 capsules by mouth 2 (two) times daily.    .Marland Kitchen  omeprazole (PRILOSEC) 20 MG capsule TAKE 1 CAPSULE BY MOUTH TWO TIMES DAILY 180 capsule 1  . ONE TOUCH ULTRA TEST test strip Use 1 daily as directed 100 each 3  . pioglitazone (ACTOS) 15 MG tablet Take 1 tablet (15 mg total) by mouth daily. 90 tablet 3  . pregabalin (LYRICA) 75 MG capsule Take 1 capsule (75 mg total) by mouth daily. 90 capsule 1  . rOPINIRole (REQUIP) 1 MG tablet Take 1 mg by mouth at bedtime.     Marland Kitchen tobramycin (TOBREX) 0.3 % ophthalmic ointment Place 1 application into the right eye. Apply the day before, the day of, and the day after eye injections.    . traZODone (DESYREL) 100 MG tablet TAKE 2 TABLETS BY MOUTH AT  BEDTIME 180 tablet 1   No current facility-administered medications on file prior to visit.     BP (!) 142/78 (BP Location: Left Arm, Patient Position: Sitting, Cuff Size: Normal)   Pulse 69   Temp 98.6 F (37 C) (Oral)   Ht 5\' 1"  (1.549 m)   Wt 195 lb 9.6 oz (88.7 kg)   SpO2 93%   BMI 36.96 kg/m    Review of Systems  Constitutional: Negative.   HENT: Negative for congestion, dental problem, hearing loss, rhinorrhea, sinus pressure, sore throat and tinnitus.   Eyes: Negative for pain, discharge and visual disturbance.  Respiratory: Negative for cough and shortness of breath.   Cardiovascular: Negative for chest pain, palpitations and leg swelling.  Gastrointestinal: Negative for abdominal distention, abdominal pain, blood in stool, constipation, diarrhea, nausea and vomiting.  Genitourinary: Negative for difficulty urinating, dysuria, flank pain, frequency, hematuria, pelvic pain, urgency, vaginal bleeding, vaginal discharge and vaginal pain.  Musculoskeletal: Positive for arthralgias and  back pain. Negative for gait problem and joint swelling.  Skin: Negative for rash.  Neurological: Negative for dizziness, syncope, speech difficulty, weakness, numbness and headaches.  Hematological: Negative for adenopathy.  Psychiatric/Behavioral: Positive for dysphoric mood and sleep disturbance. Negative for agitation and behavioral problems. The patient is nervous/anxious.        Objective:   Physical Exam  Constitutional: She is oriented to person, place, and time. She appears well-developed and well-nourished.  Weight 195 Blood pressure 130/80  HENT:  Head: Normocephalic.  Right Ear: External ear normal.  Left Ear: External ear normal.  Mouth/Throat: Oropharynx is clear and moist.  Eyes: Conjunctivae and EOM are normal. Pupils are equal, round, and reactive to light.  Neck: Normal range of motion. Neck supple. No thyromegaly present.  Cardiovascular: Normal rate, regular rhythm, normal heart sounds and intact distal pulses.   Pulmonary/Chest: Effort normal and breath sounds normal.  Abdominal: Soft. Bowel sounds are normal. She exhibits no mass. There is no tenderness.  Musculoskeletal: Normal range of motion.  Lymphadenopathy:    She has no cervical adenopathy.  Neurological: She is alert and oriented to person, place, and time.  Skin: Skin is warm and dry. No rash noted.  Psychiatric: She has a normal mood and affect. Her behavior is normal.          Assessment & Plan:   Diabetes mellitus.  Will review a hemoglobin A1c.  Additional weight loss encouraged Anxiety disorder.  We'll keep on present dose of alprazolam Essential hypertension, well-controlled Chronic low back pain.  Analgesics refilled Hypothyroidism.  We'll follow-up a TSH  Return here 4 months  Zandrea Kenealy Homero Fellers

## 2016-08-03 ENCOUNTER — Other Ambulatory Visit: Payer: Self-pay | Admitting: Internal Medicine

## 2016-08-03 IMAGING — RF DG C-ARM 61-120 MIN
1 series · 2 of 2 positions shown · non-contrast
Comparison: 11/09/2013

CLINICAL DATA: Intraoperative fluoroscopic images from lumpectomy
and anterior and posterior spinal fusion.

EXAM:
DG C-ARM 61-120 MIN

[Series 1: run · 2 of 2 slices shown]
[im 1/2]
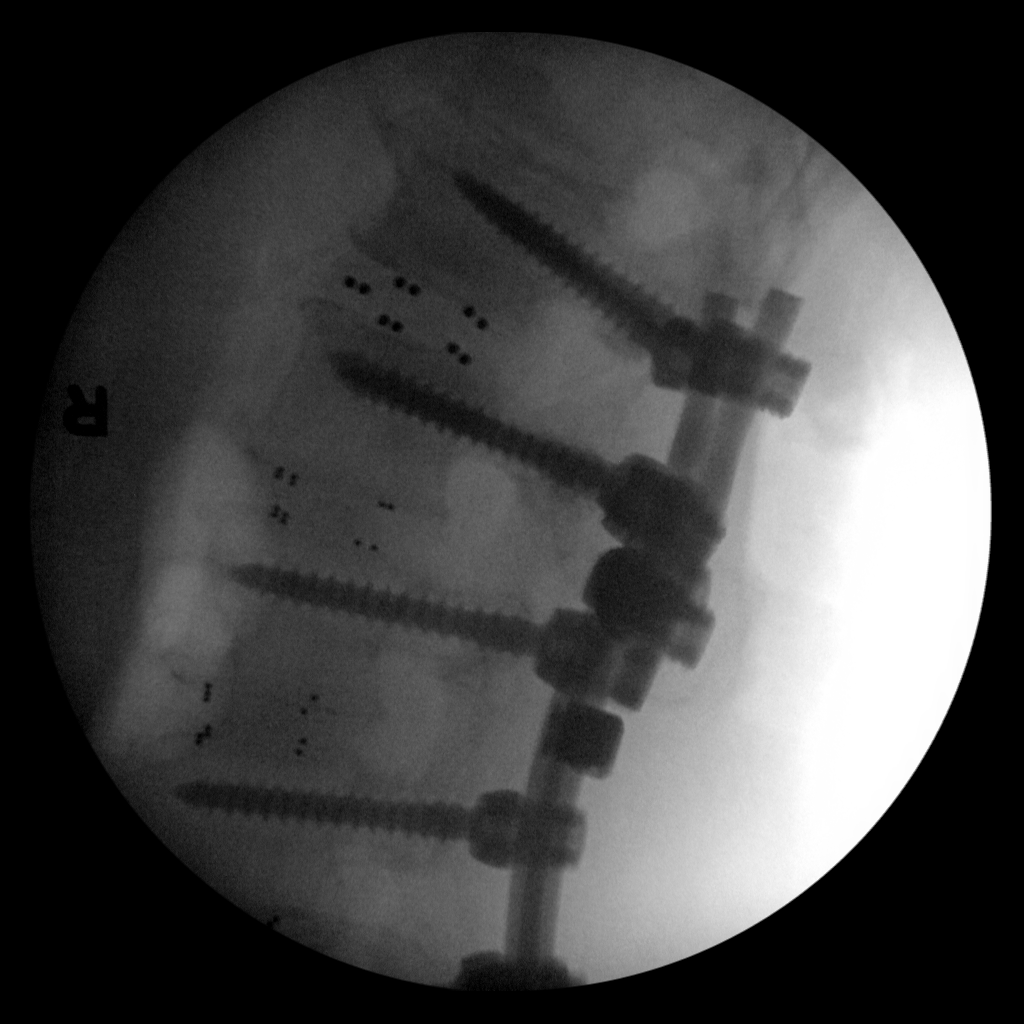
[im 2/2]
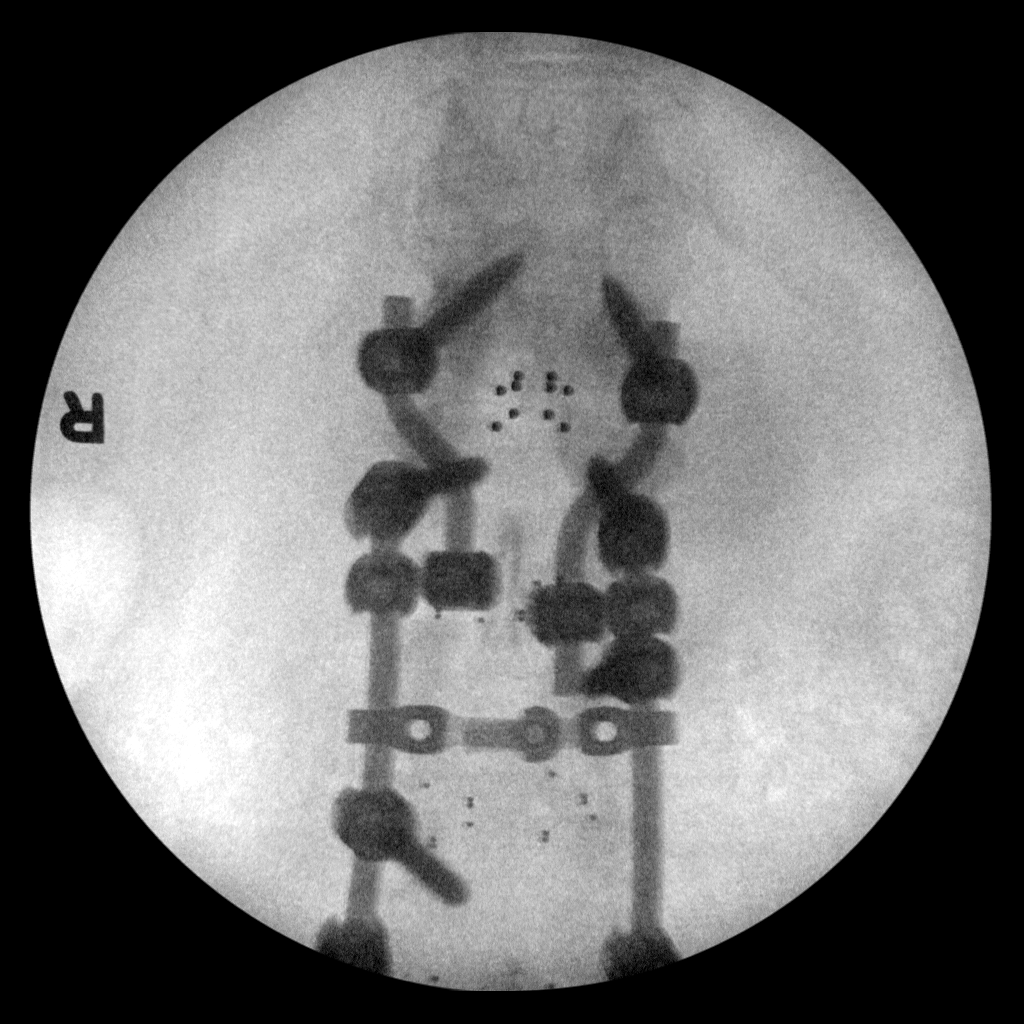

[2 of 2 positions shown; findings below may reference images not displayed]

FINDINGS: Fluoroscopy time was reported as 12 seconds.

Frontal and lateral fluoroscopic view of the lumbosacral spine
demonstrate posterior fusion of lumbosacral spine at L1-L2. There is
a pre-existing L2-L5 fusion, the inferior portion of which is
excluded by collimation. The intrapedicular surgical screws are in
satisfactory position. The alignment is near anatomic. Disc spacer
are also in satisfactory position. There is no evidence of fracture.
IMPRESSION: L1-L2 PLIF, without evidence of immediate complications.

## 2016-08-18 ENCOUNTER — Other Ambulatory Visit: Payer: Self-pay | Admitting: Internal Medicine

## 2016-08-30 ENCOUNTER — Telehealth: Payer: Self-pay | Admitting: Internal Medicine

## 2016-08-30 DIAGNOSIS — M791 Myalgia: Secondary | ICD-10-CM | POA: Diagnosis not present

## 2016-08-30 NOTE — Telephone Encounter (Signed)
Mediation was lasted refilled on 08/02/16. Rx must last 30 days. May print Rx on 09/01/16.

## 2016-08-30 NOTE — Telephone Encounter (Signed)
° ° °  Pt request refill of the following: ° °HYDROcodone-acetaminophen (NORCO) 7.5-325 MG tablet ° ° °Phamacy: °

## 2016-09-01 NOTE — Telephone Encounter (Signed)
° ° °  Pt is following up on her refill of the below med. She said she is out of this med.    HYDROcodone-acetaminophen (NORCO) 7.5-325 MG tablet

## 2016-09-02 MED ORDER — HYDROCODONE-ACETAMINOPHEN 7.5-325 MG PO TABS: 1.0000 | ORAL_TABLET | Freq: Four times a day (QID) | ORAL | 0 refills | Status: DC | PRN

## 2016-09-02 NOTE — Telephone Encounter (Signed)
Rx printed awaiting to be signed.  

## 2016-09-03 NOTE — Telephone Encounter (Signed)
Pt notified Rx ready for pickup. Rx printed and signed.  

## 2016-09-19 ENCOUNTER — Other Ambulatory Visit: Payer: Self-pay | Admitting: Internal Medicine

## 2016-09-20 ENCOUNTER — Other Ambulatory Visit: Payer: Self-pay

## 2016-09-20 MED ORDER — PREGABALIN 75 MG PO CAPS: 75.0000 mg | ORAL_CAPSULE | Freq: Every day | ORAL | 1 refills | Status: DC

## 2016-09-21 DIAGNOSIS — H353123 Nonexudative age-related macular degeneration, left eye, advanced atrophic without subfoveal involvement: Secondary | ICD-10-CM | POA: Diagnosis not present

## 2016-09-21 DIAGNOSIS — H35372 Puckering of macula, left eye: Secondary | ICD-10-CM | POA: Diagnosis not present

## 2016-09-21 DIAGNOSIS — H35423 Microcystoid degeneration of retina, bilateral: Secondary | ICD-10-CM | POA: Diagnosis not present

## 2016-09-21 DIAGNOSIS — H353211 Exudative age-related macular degeneration, right eye, with active choroidal neovascularization: Secondary | ICD-10-CM | POA: Diagnosis not present

## 2016-09-30 ENCOUNTER — Telehealth: Payer: Self-pay | Admitting: Internal Medicine

## 2016-09-30 NOTE — Telephone Encounter (Signed)
Pt request refill  °HYDROcodone-acetaminophen (NORCO) 7.5-325 MG tablet °

## 2016-09-30 NOTE — Telephone Encounter (Signed)
HYDROcodone-acetaminophen (NORCO) 7.5-325 MG tablet #120 was refilled on 09/02/2016   Too soon to fill. Rx must last 30 days. May print Rx on or after 10/03/16.

## 2016-10-04 MED ORDER — HYDROCODONE-ACETAMINOPHEN 7.5-325 MG PO TABS: 1.0000 | ORAL_TABLET | Freq: Four times a day (QID) | ORAL | 0 refills | Status: DC | PRN

## 2016-10-04 NOTE — Telephone Encounter (Signed)
Rx ready for pickup. Rx printed and signed. 

## 2016-10-04 NOTE — Telephone Encounter (Signed)
Rx printed awaiting to be signed by MD. 

## 2016-10-07 DIAGNOSIS — M791 Myalgia: Secondary | ICD-10-CM | POA: Diagnosis not present

## 2016-11-02 ENCOUNTER — Encounter: Payer: Self-pay | Admitting: Internal Medicine

## 2016-11-02 ENCOUNTER — Ambulatory Visit (INDEPENDENT_AMBULATORY_CARE_PROVIDER_SITE_OTHER): Payer: Medicare Other | Admitting: Internal Medicine

## 2016-11-02 VITALS — BP 142/64 | HR 71 | Temp 98.0°F | Ht 61.0 in | Wt 192.2 lb

## 2016-11-02 DIAGNOSIS — E119 Type 2 diabetes mellitus without complications: Secondary | ICD-10-CM | POA: Diagnosis not present

## 2016-11-02 DIAGNOSIS — M549 Dorsalgia, unspecified: Secondary | ICD-10-CM

## 2016-11-02 DIAGNOSIS — G4733 Obstructive sleep apnea (adult) (pediatric): Secondary | ICD-10-CM | POA: Diagnosis not present

## 2016-11-02 DIAGNOSIS — G8929 Other chronic pain: Secondary | ICD-10-CM

## 2016-11-02 DIAGNOSIS — I10 Essential (primary) hypertension: Secondary | ICD-10-CM

## 2016-11-02 DIAGNOSIS — E039 Hypothyroidism, unspecified: Secondary | ICD-10-CM | POA: Diagnosis not present

## 2016-11-02 LAB — POCT GLYCOSYLATED HEMOGLOBIN (HGB A1C): HEMOGLOBIN A1C: 5.7

## 2016-11-02 MED ORDER — HYDROCODONE-ACETAMINOPHEN 7.5-325 MG PO TABS: 1.0000 | ORAL_TABLET | Freq: Four times a day (QID) | ORAL | 0 refills | Status: DC | PRN

## 2016-11-02 MED ORDER — ROPINIROLE HCL 1 MG PO TABS: 1.0000 mg | ORAL_TABLET | Freq: Every day | ORAL | 4 refills | Status: DC

## 2016-11-02 MED ORDER — ALPRAZOLAM 0.5 MG PO TABS: 0.2500 mg | ORAL_TABLET | Freq: Three times a day (TID) | ORAL | 3 refills | Status: DC | PRN

## 2016-11-02 NOTE — Progress Notes (Deleted)
Subjective:    Patient ID: Katie Booker, female    DOB: 10/09/1945, 71 y.o.   MRN: [id number]  HPI  71 year old patient who is seen today in follow-up.  She has type 2 diabetes controlled on oral medications.  She has hypothyroidism and dyslipidemia. She has essential hypertension and is also followed by chronic low back pain.  She has a history of lumbar spinal stenosis and is status post 2 back surgeries, her second in January 2017. Yesterday she had acute right lumbar pain after bending over and has had worsening pain for the past 48 hours.  Lab Results  Component Value Date   HGBA1C 6.6 (H) 08/02/2016    Past Medical History:  Diagnosis Date  . Anal fissure   . ANEMIA-NOS 09/18/2009   takes Ferrous Sulfate daily  . Anxiety    takes Xanax prn sleep and anxiety  . Cancer (HCC)    basal cell left cheek  . Chronic back pain    scoliosis/stenosis/spondylosis  . DEPRESSION 09/18/2009   takes Celexa daily  . DIABETES MELLITUS, TYPE II 09/18/2009   takes Amaryl daily and Metformin  . GERD 09/18/2009   takes Omeprazole daily  . Headache(784.0)    sinus HA  . Hemorrhoids   . Histoplasmosis    hx of  . History of bronchitis    last time around 1990  . History of colon polyps   . HYPERLIPIDEMIA 09/18/2009   takes Lipitor daily  . HYPERTENSION 09/18/2009   takes Atenolol daily and Lisinopril  . HYPOTHYROIDISM 09/18/2009  . Insomnia    takes trazodone and ambien nightly  . Joint pain   . Joint swelling   . Morbid obesity (HCC)   . Obesity   . OSA (obstructive sleep apnea)    has a CPAP and sleep study report in epic from 2009  . OSTEOARTHRITIS 09/18/2009  . Peripheral neuropathy    takes Lyrica daily  . Shortness of breath    with exertion  . Skin spots, red    right above both elbows;pt states always there  . Spondylolisthesis at L1-L2 level   . Umbilical hernia   . Urinary frequency   . Urinary urgency      Social History   Social History  . Marital status:  Single    Spouse name: N/A  . Number of children: N/A  . Years of education: N/A   Occupational History  . retired    Social History Main Topics  . Smoking status: Former Smoker    Packs/day: 1.00    Years: 20.00    Types: Cigarettes    Quit date: 02/23/2004  . Smokeless tobacco: Never Used     Comment: quit 2007  . Alcohol use 0.0 oz/week     Comment: rare  . Drug use: No  . Sexual activity: No   Other Topics Concern  . Not on file   Social History Narrative  . No narrative on file    Past Surgical History:  Procedure Laterality Date  . ANTERIOR CERVICAL DECOMP/DISCECTOMY FUSION N/A 09/02/2014   Procedure: Cervical five- six  Anterior cervical decompression fusion;  Surgeon: Barnett Abu, MD;  Location: [facility];  Service: Neurosurgery;  Laterality: N/A;  C5-6 Anterior cervical decompression/diskectomy/fusion  . BACK SURGERY    . CARPAL TUNNEL RELEASE     RIGHT      2016   . COLONOSCOPY  2007  . ESOPHAGOGASTRODUODENOSCOPY  2007  . HEMORRHOID SURGERY  03/23/2011  Procedure: HEMORRHOIDECTOMY;  Surgeon: Clovis Puhomas A. Cornett, MD;  Location: [facility];  Service: General;  Laterality: N/A;  lateral internal sphincterotomy and hemorrhoidectomy  . SPHINCTEROTOMY    . THORACIC DISCECTOMY N/A 12/15/2012   Procedure: Thoracic ten-eleven Thoracic laminectomy;  Surgeon: Barnett AbuHenry Elsner, MD;  Location: [facility];  Service: Neurosurgery;  Laterality: N/A;  Thoracic ten-eleven Thoracic laminectomy  . tubal fulgaration  1991    Family History  Problem Relation Age of Onset  . Diabetes Father   . COPD Father   . Cancer Maternal Grandfather        stomach    Allergies  Allergen Reactions  . Duraprep [Tersaseptic] Itching and Rash    Irritation everywhere prep was used  . Hydromorphone Hcl     Makes crazy  . Morphine And Related Other (See Comments)    hallucinations    Current Outpatient Prescriptions on File Prior to Visit  Medication Sig Dispense Refill    . atenolol (TENORMIN) 25 MG tablet TAKE 1 TABLET BY MOUTH  DAILY 90 tablet 1  . atorvastatin (LIPITOR) 40 MG tablet TAKE 1 TABLET BY MOUTH ONCE DAILY 90 tablet 3  . Calcium Carb-Cholecalciferol (CALCIUM 600/VITAMIN D3 PO) Take 1 tablet by mouth 2 (two) times daily.    . Carboxymethylcellulose Sodium (LUBRICANT EYE DROPS OP) Apply 2 drops to eye 4 (four) times daily as needed.    . citalopram (CELEXA) 40 MG tablet TAKE 1 TABLET BY MOUTH  DAILY 90 tablet 2  . docusate sodium (COLACE) 100 MG capsule Take 100 mg by mouth 2 (two) times daily.    Marland Kitchen. estradiol (ESTRACE) 0.1 MG/GM vaginal cream Place 1 Applicatorful vaginally 3 (three) times a week. 42.5 g 12  . gemfibrozil (LOPID) 600 MG tablet TAKE 1 TABLET BY MOUTH TWO  TIMES DAILY BEFORE MEALS 180 tablet 0  . hydrocortisone 2.5 % cream Apply 1 application topically 2 (two) times daily. 30 g 2  . Lancets (ONETOUCH ULTRASOFT) lancets Use daily 100 each 6  . levothyroxine (SYNTHROID, LEVOTHROID) 175 MCG tablet Take 1 tablet (175 mcg total) by mouth daily before breakfast. 90 tablet 5  . lisinopril (PRINIVIL,ZESTRIL) 10 MG tablet TAKE 1 TABLET BY MOUTH ONCE DAILY 90 tablet 1  . loratadine (CLARITIN) 10 MG tablet Take 10 mg by mouth daily as needed for allergies.    . meloxicam (MOBIC) 15 MG tablet TAKE 1 TABLET BY MOUTH  DAILY 90 tablet 1  . metFORMIN (GLUCOPHAGE) 1000 MG tablet TAKE 1 TABLET BY MOUTH TWO  TIMES DAILY WITH MEALS 180 tablet 1  . methocarbamol (ROBAXIN) 500 MG tablet Take 500 mg by mouth every 6 (six) hours as needed for muscle spasms.    . Multiple Vitamin (MULTIVITAMIN) tablet Take 1 tablet by mouth daily.    . Omega-3 Fatty Acids (FISH OIL) 1200 MG CAPS Take 2 capsules by mouth 2 (two) times daily.    Marland Kitchen. omeprazole (PRILOSEC) 20 MG capsule TAKE 1 CAPSULE BY MOUTH TWO TIMES DAILY 180 capsule 1  . ONE TOUCH ULTRA TEST test strip Use 1 daily as directed 100 each 3  . pioglitazone (ACTOS) 15 MG tablet TAKE 1 TABLET BY MOUTH  DAILY 90 tablet  1  . pregabalin (LYRICA) 75 MG capsule Take 1 capsule (75 mg total) by mouth daily. 90 capsule 1  . tobramycin (TOBREX) 0.3 % ophthalmic ointment Place 1 application into the right eye. Apply the day before, the day of, and the day after eye injections.    .Marland Kitchen  traZODone (DESYREL) 100 MG tablet TAKE 2 TABLETS BY MOUTH AT  BEDTIME 180 tablet 1   No current facility-administered medications on file prior to visit.     BP (!) 142/64 (BP Location: Left Arm, Patient Position: Sitting, Cuff Size: Normal)   Pulse 71   Temp 98 F (36.7 C) (Oral)   Ht  (1.549 m)   Wt 192 lb 3.2 oz (87.2 kg)   SpO2 93%   BMI 36.32 kg/m     Review of Systems  Constitutional: Negative.   HENT: Negative for congestion, dental problem, hearing loss, rhinorrhea, sinus pressure, sore throat and tinnitus.   Eyes: Negative for pain, discharge and visual disturbance.  Respiratory: Negative for cough and shortness of breath.   Cardiovascular: Negative for chest pain, palpitations and leg swelling.  Gastrointestinal: Negative for abdominal distention, abdominal pain, blood in stool, constipation, diarrhea, nausea and vomiting.  Genitourinary: Negative for difficulty urinating, dysuria, flank pain, frequency, hematuria, pelvic pain, urgency, vaginal bleeding, vaginal discharge and vaginal pain.  Musculoskeletal: Positive for back pain. Negative for arthralgias, gait problem and joint swelling.  Skin: Negative for rash.  Neurological: Negative for dizziness, syncope, speech difficulty, weakness, numbness and headaches.  Hematological: Negative for adenopathy.  Psychiatric/Behavioral: Negative for agitation, behavioral problems and dysphoric mood. The patient is not nervous/anxious.        Objective:   Physical Exam  Constitutional: She is oriented to person, place, and time. She appears well-developed and well-nourished.  Weight 192 Blood pressure low normal  HENT:  Head: Normocephalic.  Right Ear: External ear  normal.  Left Ear: External ear normal.  Mouth/Throat: Oropharynx is clear and moist.  Eyes: Pupils are equal, round, and reactive to light. Conjunctivae and EOM are normal.  Neck: Normal range of motion. Neck supple. No thyromegaly present.  Cardiovascular: Normal rate, regular rhythm, normal heart sounds and intact distal pulses.   Pulmonary/Chest: Effort normal and breath sounds normal.  Abdominal: Soft. Bowel sounds are normal. She exhibits no mass. There is no tenderness.  Musculoskeletal: Normal range of motion.  Lymphadenopathy:    She has no cervical adenopathy.  Neurological: She is alert and oriented to person, place, and time.  Skin: Skin is warm and dry. No rash noted.  Psychiatric: She has a normal mood and affect. Her behavior is normal.          Assessment & Plan:   Diabetes mellitus.  Will review a hemoglobin A1c Hypertension.  Excellent control Acute on chronic low back pain.  Medications updated Hypothyroidism  Follow-up 3 months  Katie Booker Homero Fellers

## 2016-11-02 NOTE — Progress Notes (Signed)
Subjective:    Patient ID: Katie Booker, female    DOB: [birth year], 71 y.o.   MRN: [id number]  HPI  Lab Results  Component Value Date   HGBA1C 6.6 (H) 08/02/2016   71 year old patient who is seen today for follow-up of type 2 diabetes She has history of chronic low back pain, which has flared over the past 2 days. She has hypertension and osteoarthritis. She has restless leg syndrome which has been stable.  She has maintained nice diabetic control.  Her blood pressures also been well-controlled.  No issues other than her acute on chronic low back pain.  Past Medical History:  Diagnosis Date  . Anal fissure   . ANEMIA-NOS 09/18/2009   takes Ferrous Sulfate daily  . Anxiety    takes Xanax prn sleep and anxiety  . Cancer (HCC)    basal cell left cheek  . Chronic back pain    scoliosis/stenosis/spondylosis  . DEPRESSION 09/18/2009   takes Celexa daily  . DIABETES MELLITUS, TYPE II 09/18/2009   takes Amaryl daily and Metformin  . GERD 09/18/2009   takes Omeprazole daily  . Headache(784.0)    sinus HA  . Hemorrhoids   . Histoplasmosis    hx of  . History of bronchitis    last time around 1990  . History of colon polyps   . HYPERLIPIDEMIA 09/18/2009   takes Lipitor daily  . HYPERTENSION 09/18/2009   takes Atenolol daily and Lisinopril  . HYPOTHYROIDISM 09/18/2009  . Insomnia    takes trazodone and ambien nightly  . Joint pain   . Joint swelling   . Morbid obesity (HCC)   . Obesity   . OSA (obstructive sleep apnea)    has a CPAP and sleep study report in epic from 2009  . OSTEOARTHRITIS 09/18/2009  . Peripheral neuropathy    takes Lyrica daily  . Shortness of breath    with exertion  . Skin spots, red    right above both elbows;pt states always there  . Spondylolisthesis at L1-L2 level   . Umbilical hernia   . Urinary frequency   . Urinary urgency      Social History   Social History  . Marital status: Single    Spouse name: N/A  . Number of children: N/A   . Years of education: N/A   Occupational History  . retired    Social History Main Topics  . Smoking status: Former Smoker    Packs/day: 1.00    Years: 20.00    Types: Cigarettes    Quit date: 02/23/2004  . Smokeless tobacco: Never Used     Comment: quit 2007  . Alcohol use 0.0 oz/week     Comment: rare  . Drug use: No  . Sexual activity: No   Other Topics Concern  . Not on file   Social History Narrative  . No narrative on file    Past Surgical History:  Procedure Laterality Date  . ANTERIOR CERVICAL DECOMP/DISCECTOMY FUSION N/A 09/02/2014   Procedure: Cervical five- six  Anterior cervical decompression fusion;  Surgeon: Barnett AbuHenry Elsner, MD;  Location: [facility];  Service: Neurosurgery;  Laterality: N/A;  C5-6 Anterior cervical decompression/diskectomy/fusion  . BACK SURGERY    . CARPAL TUNNEL RELEASE     RIGHT      2016   . COLONOSCOPY  2007  . ESOPHAGOGASTRODUODENOSCOPY  2007  . HEMORRHOID SURGERY  03/23/2011   Procedure: HEMORRHOIDECTOMY;  Surgeon: Clovis Puhomas A. Cornett, MD;  Location:  [facility];  Service: General;  Laterality: N/A;  lateral internal sphincterotomy and hemorrhoidectomy  . SPHINCTEROTOMY    . THORACIC DISCECTOMY N/A 12/15/2012   Procedure: Thoracic ten-eleven Thoracic laminectomy;  Surgeon: Barnett Abu, MD;  Location: [facility];  Service: Neurosurgery;  Laterality: N/A;  Thoracic ten-eleven Thoracic laminectomy  . tubal fulgaration  1991    Family History  Problem Relation Age of Onset  . Diabetes Father   . COPD Father   . Cancer Maternal Grandfather        stomach    Allergies  Allergen Reactions  . Duraprep [Tersaseptic] Itching and Rash    Irritation everywhere prep was used  . Hydromorphone Hcl     Makes crazy  . Morphine And Related Other (See Comments)    hallucinations    Current Outpatient Prescriptions on File Prior to Visit  Medication Sig Dispense Refill  . atenolol (TENORMIN) 25 MG tablet TAKE 1 TABLET BY  MOUTH  DAILY 90 tablet 1  . atorvastatin (LIPITOR) 40 MG tablet TAKE 1 TABLET BY MOUTH ONCE DAILY 90 tablet 3  . Calcium Carb-Cholecalciferol (CALCIUM 600/VITAMIN D3 PO) Take 1 tablet by mouth 2 (two) times daily.    . Carboxymethylcellulose Sodium (LUBRICANT EYE DROPS OP) Apply 2 drops to eye 4 (four) times daily as needed.    . citalopram (CELEXA) 40 MG tablet TAKE 1 TABLET BY MOUTH  DAILY 90 tablet 2  . docusate sodium (COLACE) 100 MG capsule Take 100 mg by mouth 2 (two) times daily.    Marland Kitchen estradiol (ESTRACE) 0.1 MG/GM vaginal cream Place 1 Applicatorful vaginally 3 (three) times a week. 42.5 g 12  . gemfibrozil (LOPID) 600 MG tablet TAKE 1 TABLET BY MOUTH TWO  TIMES DAILY BEFORE MEALS 180 tablet 0  . hydrocortisone 2.5 % cream Apply 1 application topically 2 (two) times daily. 30 g 2  . Lancets (ONETOUCH ULTRASOFT) lancets Use daily 100 each 6  . levothyroxine (SYNTHROID, LEVOTHROID) 175 MCG tablet Take 1 tablet (175 mcg total) by mouth daily before breakfast. 90 tablet 5  . lisinopril (PRINIVIL,ZESTRIL) 10 MG tablet TAKE 1 TABLET BY MOUTH ONCE DAILY 90 tablet 1  . loratadine (CLARITIN) 10 MG tablet Take 10 mg by mouth daily as needed for allergies.    . meloxicam (MOBIC) 15 MG tablet TAKE 1 TABLET BY MOUTH  DAILY 90 tablet 1  . metFORMIN (GLUCOPHAGE) 1000 MG tablet TAKE 1 TABLET BY MOUTH TWO  TIMES DAILY WITH MEALS 180 tablet 1  . methocarbamol (ROBAXIN) 500 MG tablet Take 500 mg by mouth every 6 (six) hours as needed for muscle spasms.    . Multiple Vitamin (MULTIVITAMIN) tablet Take 1 tablet by mouth daily.    . Omega-3 Fatty Acids (FISH OIL) 1200 MG CAPS Take 2 capsules by mouth 2 (two) times daily.    Marland Kitchen omeprazole (PRILOSEC) 20 MG capsule TAKE 1 CAPSULE BY MOUTH TWO TIMES DAILY 180 capsule 1  . ONE TOUCH ULTRA TEST test strip Use 1 daily as directed 100 each 3  . pioglitazone (ACTOS) 15 MG tablet TAKE 1 TABLET BY MOUTH  DAILY 90 tablet 1  . pregabalin (LYRICA) 75 MG capsule Take 1  capsule (75 mg total) by mouth daily. 90 capsule 1  . tobramycin (TOBREX) 0.3 % ophthalmic ointment Place 1 application into the right eye. Apply the day before, the day of, and the day after eye injections.    . traZODone (DESYREL) 100 MG tablet TAKE 2 TABLETS  BY MOUTH AT  BEDTIME 180 tablet 1   No current facility-administered medications on file prior to visit.     BP (!) 142/64 (BP Location: Left Arm, Patient Position: Sitting, Cuff Size: Normal)   Pulse 71   Temp 98 F (36.7 C) (Oral)   Ht  (1.549 m)   Wt 192 lb 3.2 oz (87.2 kg)   SpO2 93%   BMI 36.32 kg/m    Review of Systems  Constitutional: Negative.   HENT: Negative for congestion, dental problem, hearing loss, rhinorrhea, sinus pressure, sore throat and tinnitus.   Eyes: Negative for pain, discharge and visual disturbance.  Respiratory: Negative for cough and shortness of breath.   Cardiovascular: Negative for chest pain, palpitations and leg swelling.  Gastrointestinal: Negative for abdominal distention, abdominal pain, blood in stool, constipation, diarrhea, nausea and vomiting.  Genitourinary: Negative for difficulty urinating, dysuria, flank pain, frequency, hematuria, pelvic pain, urgency, vaginal bleeding, vaginal discharge and vaginal pain.  Musculoskeletal: Positive for back pain and gait problem. Negative for arthralgias and joint swelling.  Skin: Negative for rash.  Neurological: Negative for dizziness, syncope, speech difficulty, weakness, numbness and headaches.  Hematological: Negative for adenopathy.  Psychiatric/Behavioral: Negative for agitation, behavioral problems and dysphoric mood. The patient is not nervous/anxious.        Objective:   Physical Exam  Constitutional: She is oriented to person, place, and time. She appears well-developed and well-nourished.  Walks with a cane Blood pressure well controlled Overweight  HENT:  Head: Normocephalic.  Right Ear: External ear normal.  Left Ear:  External ear normal.  Mouth/Throat: Oropharynx is clear and moist.  Eyes: Pupils are equal, round, and reactive to light. Conjunctivae and EOM are normal.  Neck: Normal range of motion. Neck supple. No thyromegaly present.  Cardiovascular: Normal rate, regular rhythm, normal heart sounds and intact distal pulses.   Pulmonary/Chest: Effort normal and breath sounds normal.  Abdominal: Soft. Bowel sounds are normal. She exhibits no mass. There is no tenderness.  Musculoskeletal: Normal range of motion.  Lymphadenopathy:    She has no cervical adenopathy.  Neurological: She is alert and oriented to person, place, and time.  Skin: Skin is warm and dry. No rash noted.  Psychiatric: She has a normal mood and affect. Her behavior is normal.          Assessment & Plan:   Acute on chronic low back pain Diabetes mellitus.  Excellent control Hypertension, well-controlled Obesity.  Weight loss encouraged Restless leg syndrome.  Medications refilled  Follow-up 3 months  Asyria Kolander Homero Fellers

## 2016-11-02 NOTE — Patient Instructions (Addendum)
Limit your sodium (Salt) intake   Please check your hemoglobin A1c every 3 months   Keep active but avoid any activities that cause pain.  Apply moist heat to the low back area several times daily.yes

## 2016-11-07 ENCOUNTER — Other Ambulatory Visit: Payer: Self-pay | Admitting: Internal Medicine

## 2016-11-07 DIAGNOSIS — M791 Myalgia: Secondary | ICD-10-CM | POA: Diagnosis not present

## 2016-11-11 ENCOUNTER — Encounter: Payer: Self-pay | Admitting: Internal Medicine

## 2016-11-17 DIAGNOSIS — M25552 Pain in left hip: Secondary | ICD-10-CM | POA: Diagnosis not present

## 2016-11-17 DIAGNOSIS — M25551 Pain in right hip: Secondary | ICD-10-CM | POA: Diagnosis not present

## 2016-11-17 DIAGNOSIS — M25561 Pain in right knee: Secondary | ICD-10-CM | POA: Diagnosis not present

## 2016-11-17 DIAGNOSIS — M25562 Pain in left knee: Secondary | ICD-10-CM | POA: Diagnosis not present

## 2016-11-22 DIAGNOSIS — M1712 Unilateral primary osteoarthritis, left knee: Secondary | ICD-10-CM | POA: Diagnosis not present

## 2016-11-30 ENCOUNTER — Telehealth: Payer: Self-pay | Admitting: Internal Medicine

## 2016-11-30 NOTE — Telephone Encounter (Signed)
Pt need new Rx for Hydrocodone   Pt is aware of 3 business days for refills and someone will call when ready for pick up. °

## 2016-11-30 NOTE — Telephone Encounter (Signed)
The below letter was mailed to pt's home address.   "Dear patients, The Strengthen Opioid Misuse Prevention (STOP) Act of 2017 has been signed into law to fight the opioid problem that has had a major impact in [place] and the [place].  [facility] wants to be sure to follow the law while we continue to provide you with the exceptional care you have come to expect.   For most of you, nothing will change.  Those of you who get a regular long-term pain management prescription from one of our providers will need to schedule a separate pain management appointment.  At this appointment, your provider will talk you through the changes we have had to begin because of the STOP Act.  It's the law.  What this means for you is that now you will need to have a separate pain management visit with your provider at least every 3 months.  You will also need to sign an updated controlled substance contract that will be discussed with you in detail during your visit.   We know this change can be confusing and uncomfortable, but we are here to help you every step of the way.  Please contact us today to set up your pain management appointment.    Sincerely,   [facility] " 

## 2016-11-30 NOTE — Telephone Encounter (Signed)
A detailed voice message was left informing pt that a pain management appt is needed to refill medication.

## 2016-12-01 ENCOUNTER — Ambulatory Visit (INDEPENDENT_AMBULATORY_CARE_PROVIDER_SITE_OTHER): Payer: Medicare Other | Admitting: Internal Medicine

## 2016-12-01 ENCOUNTER — Encounter: Payer: Self-pay | Admitting: Internal Medicine

## 2016-12-01 VITALS — BP 128/68 | HR 70 | Temp 97.9°F | Ht 61.0 in | Wt 194.2 lb

## 2016-12-01 DIAGNOSIS — M8949 Other hypertrophic osteoarthropathy, multiple sites: Secondary | ICD-10-CM

## 2016-12-01 DIAGNOSIS — M4712 Other spondylosis with myelopathy, cervical region: Secondary | ICD-10-CM

## 2016-12-01 DIAGNOSIS — M150 Primary generalized (osteo)arthritis: Secondary | ICD-10-CM

## 2016-12-01 DIAGNOSIS — G8929 Other chronic pain: Secondary | ICD-10-CM

## 2016-12-01 DIAGNOSIS — M4714 Other spondylosis with myelopathy, thoracic region: Secondary | ICD-10-CM | POA: Diagnosis not present

## 2016-12-01 DIAGNOSIS — Z23 Encounter for immunization: Secondary | ICD-10-CM

## 2016-12-01 DIAGNOSIS — M159 Polyosteoarthritis, unspecified: Secondary | ICD-10-CM

## 2016-12-01 MED ORDER — HYDROCODONE-ACETAMINOPHEN 7.5-325 MG PO TABS: 1.0000 | ORAL_TABLET | Freq: Four times a day (QID) | ORAL | 0 refills | Status: DC | PRN

## 2016-12-01 NOTE — Progress Notes (Signed)
   Subjective:    Patient ID: Katie Booker, female    DOB: April 16, 1945, 71 y.o.   MRN: [id number]  HPI  71 year old patient who is seen today for a pain management visit per opioid protocol.  [place] database printed, initialed  and scanned into the EMR.  Indication for chronic opioid: patient has chronic back pain.  She has a history of myelopathy and is followed by neurosurgery and orthopedics.  She also has significant knee pain and has recently had some injections.  She is considetal knee replacement surgery Medication and dose:  Hydrocodone-acetaminophen 7.5-3 2 5    # pills per month: maximum 120 per month Last UDS date: 12/01/2016 Pain contract signed (Y/N): yes Date narcotic database last reviewed (include red flags): 12/01/2016  Review of Systems     Objective:   Physical Exam        Assessment & Plan:   Encounter for chronic pain management (G89.29) Narcotic use  (711.90) Pain management contract signed (Z02.89)  Rogelia Boga

## 2016-12-01 NOTE — Patient Instructions (Signed)
Return as scheduled for follow-up 

## 2016-12-02 NOTE — Telephone Encounter (Signed)
Pt had been scheduled 12/01/16 by Elnita Maxwell

## 2016-12-07 DIAGNOSIS — M791 Myalgia, unspecified site: Secondary | ICD-10-CM | POA: Diagnosis not present

## 2016-12-07 DIAGNOSIS — H35423 Microcystoid degeneration of retina, bilateral: Secondary | ICD-10-CM | POA: Diagnosis not present

## 2016-12-07 DIAGNOSIS — H353211 Exudative age-related macular degeneration, right eye, with active choroidal neovascularization: Secondary | ICD-10-CM | POA: Diagnosis not present

## 2016-12-07 DIAGNOSIS — H353123 Nonexudative age-related macular degeneration, left eye, advanced atrophic without subfoveal involvement: Secondary | ICD-10-CM | POA: Diagnosis not present

## 2016-12-07 DIAGNOSIS — H35433 Paving stone degeneration of retina, bilateral: Secondary | ICD-10-CM | POA: Diagnosis not present

## 2016-12-08 DIAGNOSIS — M1711 Unilateral primary osteoarthritis, right knee: Secondary | ICD-10-CM | POA: Diagnosis not present

## 2016-12-15 DIAGNOSIS — M1711 Unilateral primary osteoarthritis, right knee: Secondary | ICD-10-CM | POA: Diagnosis not present

## 2016-12-22 DIAGNOSIS — M1711 Unilateral primary osteoarthritis, right knee: Secondary | ICD-10-CM | POA: Diagnosis not present

## 2016-12-25 ENCOUNTER — Other Ambulatory Visit: Payer: Self-pay | Admitting: Internal Medicine

## 2017-01-07 ENCOUNTER — Telehealth: Payer: Self-pay | Admitting: *Deleted

## 2017-01-07 DIAGNOSIS — M791 Myalgia, unspecified site: Secondary | ICD-10-CM | POA: Diagnosis not present

## 2017-01-07 NOTE — Telephone Encounter (Signed)
Copied from CRM [id number]. Topic: General - Other >> Jan 07, 2017  2:53 PM Crist InfanteHarrald, Kathy J wrote: Reason for CRM: [facility] called to advise the pt's citalopram (CELEXA) 40 MG tablet with the the combination of omeprazole (PRILOSEC) 20 MG capsule can cause QT prolongation. Would like you to consider switching to protoprazole or lansoprazole or dexilant  However, if dr is aware of the warnings, can continue   [facility] will send a fax for md to fill out

## 2017-01-12 NOTE — Telephone Encounter (Signed)
Faxed was place in providers folder for review.

## 2017-01-23 ENCOUNTER — Other Ambulatory Visit: Payer: Self-pay | Admitting: Internal Medicine

## 2017-01-25 ENCOUNTER — Telehealth: Payer: Self-pay | Admitting: Internal Medicine

## 2017-01-25 NOTE — Telephone Encounter (Signed)
Copied from CRM [id number]. Topic: Quick Communication - See Telephone Encounter >> Jan 25, 2017  4:26 PM [name], [name], NT wrote: CRM for notification. See Telephone encounter for: [name] from the pharmacy to speak to someone about an interaction between citalopram (CELEXA) 40 MG tablet and omeprazole (PRILOSEC) 20 MG capsule. The doctor decided to change medications and they wanted to change the quantity. Pharmacy not sure what medication the patient needs. Would like a call back.  01/25/17.

## 2017-01-26 ENCOUNTER — Other Ambulatory Visit: Payer: Self-pay | Admitting: Internal Medicine

## 2017-01-26 MED ORDER — PANTOPRAZOLE SODIUM 40 MG PO TBEC: 40.0000 mg | DELAYED_RELEASE_TABLET | Freq: Every day | ORAL | 2 refills | Status: DC

## 2017-01-26 NOTE — Telephone Encounter (Signed)
Called [phone], it is a number to a ink company.

## 2017-01-26 NOTE — Telephone Encounter (Signed)
Called pharmacy at [phone] Spoke with Georga Hackingary, pharmacist and clarified that Dr Kirtland BouchardK did want to d/c omeprazole and start Protonix 40 390 with 2 refills.

## 2017-01-31 ENCOUNTER — Ambulatory Visit: Payer: Medicare Other | Admitting: Internal Medicine

## 2017-02-21 ENCOUNTER — Other Ambulatory Visit: Payer: Self-pay | Admitting: Internal Medicine

## 2017-03-01 DIAGNOSIS — H35372 Puckering of macula, left eye: Secondary | ICD-10-CM | POA: Diagnosis not present

## 2017-03-01 DIAGNOSIS — H353123 Nonexudative age-related macular degeneration, left eye, advanced atrophic without subfoveal involvement: Secondary | ICD-10-CM | POA: Diagnosis not present

## 2017-03-01 DIAGNOSIS — H43813 Vitreous degeneration, bilateral: Secondary | ICD-10-CM | POA: Diagnosis not present

## 2017-03-01 DIAGNOSIS — H353211 Exudative age-related macular degeneration, right eye, with active choroidal neovascularization: Secondary | ICD-10-CM | POA: Diagnosis not present

## 2017-03-03 ENCOUNTER — Ambulatory Visit: Payer: Medicare Other | Admitting: Internal Medicine

## 2017-03-03 ENCOUNTER — Encounter: Payer: Self-pay | Admitting: Internal Medicine

## 2017-03-03 VITALS — BP 110/60 | HR 86 | Temp 97.7°F | Ht 61.0 in | Wt 191.6 lb

## 2017-03-03 DIAGNOSIS — F1190 Opioid use, unspecified, uncomplicated: Secondary | ICD-10-CM

## 2017-03-03 DIAGNOSIS — M549 Dorsalgia, unspecified: Secondary | ICD-10-CM | POA: Diagnosis not present

## 2017-03-03 DIAGNOSIS — G8929 Other chronic pain: Secondary | ICD-10-CM | POA: Diagnosis not present

## 2017-03-03 DIAGNOSIS — M48062 Spinal stenosis, lumbar region with neurogenic claudication: Secondary | ICD-10-CM | POA: Diagnosis not present

## 2017-03-03 MED ORDER — TIZANIDINE HCL 4 MG PO CAPS: 4.0000 mg | ORAL_CAPSULE | Freq: Three times a day (TID) | ORAL | 3 refills | Status: DC

## 2017-03-03 MED ORDER — HYDROCODONE-ACETAMINOPHEN 7.5-325 MG PO TABS: 1.0000 | ORAL_TABLET | Freq: Four times a day (QID) | ORAL | 0 refills | Status: DC | PRN

## 2017-03-03 NOTE — Progress Notes (Signed)
   Subjective:    Patient ID: Katie Booker, female    DOB: 01/10/1946, 72 y.o.   MRN: [id number]  HPI  72 year old patient who is seen today for a pain management visit per opioid protocol.  [place] database printed, initialed  and scanned into the EMR.  Indication for chronic opioid: patient has chronic back pain.  She has a history of myelopathy and is followed by neurosurgery and orthopedics.  She also has significant knee pain and has recently had some injections.  She is being considered for knee replacement surgery.  For the past month she has had some bilateral leg pain.  She is scheduled to see neurosurgery in follow-up soon.  She has been prescribed methocarbamol in the past with benefit.  This is no longer covered by her insurance and she wishes to switch to another muscle relaxer. Medication and dose:  Hydrocodone-acetaminophen 7.5-3 2 5    # pills per month: maximum 120 per month Last UDS date: 12/01/2016 Pain contract signed (Y/N): yes Date narcotic database last reviewed (include red flags): 12/01/2016  Review of Systems     Objective:   Physical Exam        Assessment & Plan:   Encounter for chronic pain management (G89.29) Narcotic use  (711.90) Pain management contract signed (Z02.89)  Rogelia BogaKWIATKOWSKI,Hipolito Martinezlopez FRANK

## 2017-03-03 NOTE — Patient Instructions (Signed)
Return in 3 months for follow-up  Neurosurgical follow-up as scheduled

## 2017-03-07 ENCOUNTER — Ambulatory Visit: Payer: Medicare Other | Admitting: Internal Medicine

## 2017-03-10 ENCOUNTER — Other Ambulatory Visit: Payer: Self-pay | Admitting: Internal Medicine

## 2017-03-10 LAB — PAIN MGMT, PROFILE 8 W/CONF, U
6 ACETYLMORPHINE: NEGATIVE ng/mL (ref <10)
ALPHAHYDROXYALPRAZOLAM: 47 ng/mL — AB (ref <25)
ALPHAHYDROXYTRIAZOLAM: NEGATIVE ng/mL (ref <50)
Alcohol Metabolites: NEGATIVE ng/mL (ref <500)
Alphahydroxymidazolam: NEGATIVE ng/mL (ref <50)
Aminoclonazepam: NEGATIVE ng/mL (ref <25)
Amphetamines: NEGATIVE ng/mL (ref <500)
Benzodiazepines: POSITIVE ng/mL — AB (ref <100)
Buprenorphine, Urine: NEGATIVE ng/mL (ref <5)
CODEINE: NEGATIVE ng/mL (ref <50)
Cocaine Metabolite: NEGATIVE ng/mL (ref <150)
Creatinine: 22.6 mg/dL
Hydrocodone: 149 ng/mL — ABNORMAL HIGH (ref <50)
Hydromorphone: 74 ng/mL — ABNORMAL HIGH (ref <50)
Hydroxyethylflurazepam: NEGATIVE ng/mL (ref <50)
Lorazepam: NEGATIVE ng/mL (ref <50)
MARIJUANA METABOLITE: NEGATIVE ng/mL (ref <20)
MDMA: NEGATIVE ng/mL (ref <500)
Morphine: NEGATIVE ng/mL (ref <50)
NORHYDROCODONE: 408 ng/mL — AB (ref <50)
Nordiazepam: NEGATIVE ng/mL (ref <50)
OPIATES: POSITIVE ng/mL — AB (ref <100)
OXAZEPAM: NEGATIVE ng/mL (ref <50)
Oxidant: NEGATIVE ug/mL (ref <200)
Oxycodone: NEGATIVE ng/mL (ref <100)
PH: 6.66 (ref 4.5–9.0)
Temazepam: NEGATIVE ng/mL (ref <50)

## 2017-03-16 ENCOUNTER — Telehealth: Payer: Self-pay | Admitting: Internal Medicine

## 2017-03-16 ENCOUNTER — Other Ambulatory Visit: Payer: Self-pay | Admitting: Internal Medicine

## 2017-03-16 MED ORDER — ALPRAZOLAM 0.5 MG PO TABS: 0.2500 mg | ORAL_TABLET | Freq: Three times a day (TID) | ORAL | 3 refills | Status: DC | PRN

## 2017-03-16 NOTE — Telephone Encounter (Signed)
Copied from CRM [id number]. Topic: Quick Communication - Rx Refill/Question >> Mar 16, 2017  3:10 PM Jolayne Hainesaylor, Brittany L wrote: Medication: ALPRAZolam Prudy Feeler(XANAX) 0.5 MG tablet   Has the patient contacted their pharmacy? Dr Theron AristaPeter told her at her appt that he was going to call this in to [facility] & he did not   (Agent: If no, request that the patient contact the pharmacy for the refill.)   Preferred Pharmacy (with phone number or street name): [facility]   Agent: Please be advised that RX refills may take up to 3 business days. We ask that you follow-up with your pharmacy.

## 2017-03-16 NOTE — Telephone Encounter (Signed)
Rx refill request

## 2017-03-17 NOTE — Telephone Encounter (Signed)
Rx was faxed to the pharmacy  

## 2017-05-11 ENCOUNTER — Other Ambulatory Visit: Payer: Self-pay | Admitting: Internal Medicine

## 2017-05-24 DIAGNOSIS — H353211 Exudative age-related macular degeneration, right eye, with active choroidal neovascularization: Secondary | ICD-10-CM | POA: Diagnosis not present

## 2017-05-24 DIAGNOSIS — H353123 Nonexudative age-related macular degeneration, left eye, advanced atrophic without subfoveal involvement: Secondary | ICD-10-CM | POA: Diagnosis not present

## 2017-05-24 DIAGNOSIS — H35372 Puckering of macula, left eye: Secondary | ICD-10-CM | POA: Diagnosis not present

## 2017-05-24 DIAGNOSIS — H35423 Microcystoid degeneration of retina, bilateral: Secondary | ICD-10-CM | POA: Diagnosis not present

## 2017-05-25 ENCOUNTER — Ambulatory Visit: Payer: Medicare Other | Admitting: Internal Medicine

## 2017-05-25 ENCOUNTER — Encounter: Payer: Self-pay | Admitting: Internal Medicine

## 2017-05-25 VITALS — BP 102/60 | HR 79 | Temp 98.7°F | Wt 196.0 lb

## 2017-05-25 DIAGNOSIS — M549 Dorsalgia, unspecified: Secondary | ICD-10-CM | POA: Diagnosis not present

## 2017-05-25 DIAGNOSIS — I10 Essential (primary) hypertension: Secondary | ICD-10-CM

## 2017-05-25 DIAGNOSIS — G8929 Other chronic pain: Secondary | ICD-10-CM

## 2017-05-25 DIAGNOSIS — E119 Type 2 diabetes mellitus without complications: Secondary | ICD-10-CM

## 2017-05-25 DIAGNOSIS — E785 Hyperlipidemia, unspecified: Secondary | ICD-10-CM | POA: Diagnosis not present

## 2017-05-25 DIAGNOSIS — E039 Hypothyroidism, unspecified: Secondary | ICD-10-CM | POA: Diagnosis not present

## 2017-05-25 LAB — POCT GLYCOSYLATED HEMOGLOBIN (HGB A1C): Hemoglobin A1C: 5.6

## 2017-05-25 MED ORDER — HYDROCODONE-ACETAMINOPHEN 7.5-325 MG PO TABS: 1.0000 | ORAL_TABLET | Freq: Four times a day (QID) | ORAL | 0 refills | Status: DC | PRN

## 2017-05-25 MED ORDER — PANTOPRAZOLE SODIUM 40 MG PO TBEC
40.0000 mg | DELAYED_RELEASE_TABLET | Freq: Every day | ORAL | 2 refills | Status: DC
Start: 2017-05-25 — End: 2017-08-16

## 2017-05-25 MED ORDER — ALPRAZOLAM 0.5 MG PO TABS: 0.5000 mg | ORAL_TABLET | Freq: Two times a day (BID) | ORAL | 3 refills | Status: DC | PRN

## 2017-05-25 NOTE — Patient Instructions (Signed)
Please check your hemoglobin A1c every 3 months  Limit your sodium (Salt) intake    It is important that you exercise regularly, at least 20 minutes 3 to 4 times per week.  If you develop chest pain or shortness of breath seek  medical attention.  Consider referral for chronic pain management

## 2017-05-25 NOTE — Progress Notes (Signed)
Subjective:    Patient ID: Katie Booker A Frater, female    DOB: 23-Sep-1945, 72 y.o.   MRN: [id number]  HPI  72 year old patient who is seen today for follow-up of type 2 diabetes. She has chronic back pain and is also seen today for encounter for pain management  .Indication for chronic opioid: Chronic low back pain Medication and dose: Hydrocodone 7.5 mg 3-4 times daily # pills per month: 120 Last UDS date:  March 03, 2017 Opioid Treatment Agreement signed (Y/N): Yes Opioid Treatment Agreement last reviewed with patient:  March 03, 2017 NCCSRS reviewed this encounter (include red flags):     Hemoglobin A1c today 5.6.  Patient has had increase in situational stress.  She is approaching the anniversary death of her mother and has been quite anxious and depressed.  She complains of increasing fatigue and insomnia.  She has had frequent daytime napping. She also complains of breakthrough dyspepsia in spite of Protonix 40 mg daily.  Past Medical History:  Diagnosis Date  . Anal fissure   . ANEMIA-NOS 09/18/2009   takes Ferrous Sulfate daily  . Anxiety    takes Xanax prn sleep and anxiety  . Cancer (HCC)    basal cell left cheek  . Chronic back pain    scoliosis/stenosis/spondylosis  . DEPRESSION 09/18/2009   takes Celexa daily  . DIABETES MELLITUS, TYPE II 09/18/2009   takes Amaryl daily and Metformin  . GERD 09/18/2009   takes Omeprazole daily  . Headache(784.0)    sinus HA  . Hemorrhoids   . Histoplasmosis    hx of  . History of bronchitis    last time around 1990  . History of colon polyps   . HYPERLIPIDEMIA 09/18/2009   takes Lipitor daily  . HYPERTENSION 09/18/2009   takes Atenolol daily and Lisinopril  . HYPOTHYROIDISM 09/18/2009  . Insomnia    takes trazodone and ambien nightly  . Joint pain   . Joint swelling   . Morbid obesity (HCC)   . Obesity   . OSA (obstructive sleep apnea)    has a CPAP and sleep study report in epic from 2009  . OSTEOARTHRITIS  09/18/2009  . Peripheral neuropathy    takes Lyrica daily  . Shortness of breath    with exertion  . Skin spots, red    right above both elbows;pt states always there  . Spondylolisthesis at L1-L2 level   . Umbilical hernia   . Urinary frequency   . Urinary urgency      Social History   Socioeconomic History  . Marital status: Single    Spouse name: Not on file  . Number of children: Not on file  . Years of education: Not on file  . Highest education level: Not on file  Occupational History  . Occupation: retired  Engineer, productionocial Needs  . Financial resource strain: Not on file  . Food insecurity:    Worry: Not on file    Inability: Not on file  . Transportation needs:    Medical: Not on file    Non-medical: Not on file  Tobacco Use  . Smoking status: Former Smoker    Packs/day: 1.00    Years: 20.00    Pack years: 20.00    Types: Cigarettes    Last attempt to quit: 02/23/2004    Years since quitting: 13.2  . Smokeless tobacco: Never Used  . Tobacco comment: quit 2007  Substance and Sexual Activity  . Alcohol use: Yes  Alcohol/week: 0.0 oz    Comment: rare  . Drug use: No  . Sexual activity: Never    Birth control/protection: Post-menopausal  Lifestyle  . Physical activity:    Days per week: Not on file    Minutes per session: Not on file  . Stress: Not on file  Relationships  . Social connections:    Talks on phone: Not on file    Gets together: Not on file    Attends religious service: Not on file    Active member of club or organization: Not on file    Attends meetings of clubs or organizations: Not on file    Relationship status: Not on file  . Intimate partner violence:    Fear of current or ex partner: Not on file    Emotionally abused: Not on file    Physically abused: Not on file    Forced sexual activity: Not on file  Other Topics Concern  . Not on file  Social History Narrative  . Not on file    Past Surgical History:  Procedure Laterality Date    . ANTERIOR CERVICAL DECOMP/DISCECTOMY FUSION N/A 09/02/2014   Procedure: Cervical five- six  Anterior cervical decompression fusion;  Surgeon: Barnett Abu, MD;  Location: [facility];  Service: Neurosurgery;  Laterality: N/A;  C5-6 Anterior cervical decompression/diskectomy/fusion  . BACK SURGERY    . CARPAL TUNNEL RELEASE     RIGHT      2016   . COLONOSCOPY  2007  . ESOPHAGOGASTRODUODENOSCOPY  2007  . HEMORRHOID SURGERY  03/23/2011   Procedure: HEMORRHOIDECTOMY;  Surgeon: Clovis Pu. Cornett, MD;  Location: [facility];  Service: General;  Laterality: N/A;  lateral internal sphincterotomy and hemorrhoidectomy  . SPHINCTEROTOMY    . THORACIC DISCECTOMY N/A 12/15/2012   Procedure: Thoracic ten-eleven Thoracic laminectomy;  Surgeon: Barnett Abu, MD;  Location: [facility];  Service: Neurosurgery;  Laterality: N/A;  Thoracic ten-eleven Thoracic laminectomy  . tubal fulgaration  1991    Family History  Problem Relation Age of Onset  . Diabetes Father   . COPD Father   . Cancer Maternal Grandfather        stomach    Allergies  Allergen Reactions  . Duraprep [Tersaseptic] Itching and Rash    Irritation everywhere prep was used  . Hydromorphone Hcl     Makes crazy  . Morphine And Related Other (See Comments)    hallucinations    Current Outpatient Medications on File Prior to Visit  Medication Sig Dispense Refill  . atenolol (TENORMIN) 25 MG tablet TAKE 1 TABLET BY MOUTH  DAILY 90 tablet 1  . atorvastatin (LIPITOR) 40 MG tablet TAKE 1 TABLET BY MOUTH ONCE DAILY 90 tablet 3  . Calcium Carb-Cholecalciferol (CALCIUM 600/VITAMIN D3 PO) Take 1 tablet by mouth 2 (two) times daily.    . Carboxymethylcellulose Sodium (LUBRICANT EYE DROPS OP) Apply 2 drops to eye 4 (four) times daily as needed.    . citalopram (CELEXA) 40 MG tablet TAKE 1 TABLET BY MOUTH  DAILY 90 tablet 2  . docusate sodium (COLACE) 100 MG capsule Take 100 mg by mouth 2 (two) times daily.    Marland Kitchen estradiol  (ESTRACE) 0.1 MG/GM vaginal cream Place 1 Applicatorful vaginally 3 (three) times a week. 42.5 g 12  . gemfibrozil (LOPID) 600 MG tablet TAKE 1 TABLET BY MOUTH TWO  TIMES DAILY BEFORE MEALS 180 tablet 1  . hydrocortisone 2.5 % cream Apply 1 application topically 2 (two)  times daily. 30 g 2  . Lancets (ONETOUCH ULTRASOFT) lancets Use daily 100 each 6  . levothyroxine (SYNTHROID, LEVOTHROID) 175 MCG tablet Take 1 tablet (175 mcg total) by mouth daily before breakfast. 90 tablet 5  . lisinopril (PRINIVIL,ZESTRIL) 10 MG tablet TAKE 1 TABLET BY MOUTH ONCE DAILY 90 tablet 1  . loratadine (CLARITIN) 10 MG tablet Take 10 mg by mouth daily as needed for allergies.    . meloxicam (MOBIC) 15 MG tablet TAKE 1 TABLET BY MOUTH  DAILY 90 tablet 1  . metFORMIN (GLUCOPHAGE) 1000 MG tablet TAKE 1 TABLET BY MOUTH TWO  TIMES DAILY WITH MEALS 180 tablet 1  . Multiple Vitamin (MULTIVITAMIN) tablet Take 1 tablet by mouth daily.    . Omega-3 Fatty Acids (FISH OIL) 1200 MG CAPS Take 2 capsules by mouth 2 (two) times daily.    . ONE TOUCH ULTRA TEST test strip USE 1 DAILY AS DIRECTED 100 each 3  . pioglitazone (ACTOS) 15 MG tablet TAKE 1 TABLET BY MOUTH  DAILY 90 tablet 1  . pregabalin (LYRICA) 75 MG capsule Take 1 capsule (75 mg total) by mouth daily. 90 capsule 1  . rOPINIRole (REQUIP) 1 MG tablet Take 1 tablet (1 mg total) by mouth at bedtime. 90 tablet 4  . tiZANidine (ZANAFLEX) 4 MG capsule Take 1 capsule (4 mg total) by mouth 3 (three) times daily. 60 capsule 3  . tobramycin (TOBREX) 0.3 % ophthalmic ointment Place 1 application into the right eye. Apply the day before, the day of, and the day after eye injections.    . traZODone (DESYREL) 100 MG tablet TAKE 2 TABLETS BY MOUTH AT  BEDTIME 180 tablet 1   No current facility-administered medications on file prior to visit.     BP 102/60 (BP Location: Right Arm, Patient Position: Sitting, Cuff Size: Large)   Pulse 79   Temp 98.7 F (37.1 C) (Oral)   Wt 196 lb  (88.9 kg)   SpO2 94%   BMI 37.03 kg/m     Review of Systems  Constitutional: Negative.   HENT: Negative for congestion, dental problem, hearing loss, rhinorrhea, sinus pressure, sore throat and tinnitus.   Eyes: Negative for pain, discharge and visual disturbance.  Respiratory: Negative for cough and shortness of breath.   Cardiovascular: Negative for chest pain, palpitations and leg swelling.  Gastrointestinal: Negative for abdominal distention, abdominal pain, blood in stool, constipation, diarrhea, nausea and vomiting.  Genitourinary: Negative for difficulty urinating, dysuria, flank pain, frequency, hematuria, pelvic pain, urgency, vaginal bleeding, vaginal discharge and vaginal pain.  Musculoskeletal: Positive for back pain. Negative for arthralgias, gait problem and joint swelling.  Skin: Negative for rash.  Neurological: Negative for dizziness, syncope, speech difficulty, weakness, numbness and headaches.  Hematological: Negative for adenopathy.  Psychiatric/Behavioral: Positive for behavioral problems, dysphoric mood and sleep disturbance. Negative for agitation. The patient is nervous/anxious.        Objective:   Physical Exam  Constitutional: She is oriented to person, place, and time. She appears well-developed and well-nourished.  Weight 196  HENT:  Head: Normocephalic.  Right Ear: External ear normal.  Left Ear: External ear normal.  Mouth/Throat: Oropharynx is clear and moist.  Eyes: Pupils are equal, round, and reactive to light. Conjunctivae and EOM are normal.  Neck: Normal range of motion. Neck supple. No thyromegaly present.  Cardiovascular: Normal rate, regular rhythm, normal heart sounds and intact distal pulses.  Pulmonary/Chest: Effort normal and breath sounds normal.  Abdominal: Soft. Bowel sounds are  normal. She exhibits no mass. There is no tenderness.  Musculoskeletal: Normal range of motion.  Lymphadenopathy:    She has no cervical adenopathy.    Neurological: She is alert and oriented to person, place, and time.  Skin: Skin is warm and dry. No rash noted.  Psychiatric: She has a normal mood and affect. Her behavior is normal.          Assessment & Plan:  Chronic low back pain.  Status post spinal surgery x2.  She feels that her pain is not well controlled.  She will consider referral for chronic pain management Grief reaction.  Continue present regimen.  Encounter for chronic pain management (G89.29) Narcotic use  (711.90) Pain management contract signed (Z02.89)  Diabetes mellitus well controlled  Return in 3 months for follow-up  Rogelia Boga

## 2017-05-26 ENCOUNTER — Other Ambulatory Visit: Payer: Self-pay | Admitting: Internal Medicine

## 2017-06-01 ENCOUNTER — Ambulatory Visit: Payer: Medicare Other | Admitting: Internal Medicine

## 2017-06-28 ENCOUNTER — Other Ambulatory Visit: Payer: Self-pay | Admitting: Internal Medicine

## 2017-08-16 ENCOUNTER — Other Ambulatory Visit: Payer: Self-pay | Admitting: Internal Medicine

## 2017-08-16 DIAGNOSIS — H43813 Vitreous degeneration, bilateral: Secondary | ICD-10-CM | POA: Diagnosis not present

## 2017-08-16 DIAGNOSIS — H353123 Nonexudative age-related macular degeneration, left eye, advanced atrophic without subfoveal involvement: Secondary | ICD-10-CM | POA: Diagnosis not present

## 2017-08-16 DIAGNOSIS — H353211 Exudative age-related macular degeneration, right eye, with active choroidal neovascularization: Secondary | ICD-10-CM | POA: Diagnosis not present

## 2017-08-16 DIAGNOSIS — H35372 Puckering of macula, left eye: Secondary | ICD-10-CM | POA: Diagnosis not present

## 2017-08-17 ENCOUNTER — Encounter: Payer: Self-pay | Admitting: Family Medicine

## 2017-08-17 NOTE — Telephone Encounter (Signed)
90 day supply sent to the pharmacy by e-scribe.  Last seen 05/25/17 and advised to follow up in 3 months for follow up with Dr. Kirtland BouchardK.  Unable to call pt due to phone lines being down.  Will mail a letter.

## 2017-08-29 ENCOUNTER — Ambulatory Visit: Payer: Medicare Other | Admitting: Internal Medicine

## 2017-08-29 ENCOUNTER — Encounter: Payer: Self-pay | Admitting: Internal Medicine

## 2017-08-29 VITALS — BP 100/70 | HR 75 | Temp 98.5°F | Wt 191.0 lb

## 2017-08-29 DIAGNOSIS — M48062 Spinal stenosis, lumbar region with neurogenic claudication: Secondary | ICD-10-CM | POA: Diagnosis not present

## 2017-08-29 DIAGNOSIS — M4316 Spondylolisthesis, lumbar region: Secondary | ICD-10-CM

## 2017-08-29 DIAGNOSIS — M549 Dorsalgia, unspecified: Secondary | ICD-10-CM | POA: Diagnosis not present

## 2017-08-29 DIAGNOSIS — I10 Essential (primary) hypertension: Secondary | ICD-10-CM | POA: Diagnosis not present

## 2017-08-29 DIAGNOSIS — E119 Type 2 diabetes mellitus without complications: Secondary | ICD-10-CM | POA: Diagnosis not present

## 2017-08-29 DIAGNOSIS — G8929 Other chronic pain: Secondary | ICD-10-CM

## 2017-08-29 DIAGNOSIS — M5489 Other dorsalgia: Secondary | ICD-10-CM

## 2017-08-29 LAB — POCT GLYCOSYLATED HEMOGLOBIN (HGB A1C): Hemoglobin A1C: 5.8 % — AB (ref 4.0–5.6)

## 2017-08-29 MED ORDER — HYDROCODONE-ACETAMINOPHEN 7.5-325 MG PO TABS: 1.0000 | ORAL_TABLET | Freq: Four times a day (QID) | ORAL | 0 refills | Status: DC | PRN

## 2017-08-29 MED ORDER — PANTOPRAZOLE SODIUM 40 MG PO TBEC: 40.0000 mg | DELAYED_RELEASE_TABLET | Freq: Two times a day (BID) | ORAL | 3 refills | Status: DC

## 2017-08-29 NOTE — Progress Notes (Signed)
Subjective:    Patient ID: Katie Booker, female    DOB: [birth year], 72 y.o.   MRN: [id number]  HPI  72 year old patient who is seen today for chronic pain management follow-up per opioid protocol  NCCSRS database printed, initialed  and scanned into the EMR.  Indication for chronic opioid: Patient has chronic low back pain.  Medication and dose: Present medical regimen includes hydrocodone 7.53-4 times per day # pills per month: Maximum 120 Last UDS date: March 03, 2017 Opioid Treatment Agreement signed (Y/N): Yes Opioid Treatment Agreement last reviewed with patient:  May 25, 2017 NCCSRS reviewed this encounter (include red flags):  Reviewed overall risk score 320  Patient has a history of type 2 diabetes  Lab Results  Component Value Date   HGBA1C 5.8 (A) 08/29/2017   She has treated hypertension.  Patient is using a walker due to back and also left knee pain.  She states that she anticipates left total knee replacement surgery later in the year.  She wishes to wait until the weather is cooler. She has significant reflux symptoms.  Does not follow a antireflux diet.  Patient information dispensed and this was encouraged  She has a history of anxiety disorder.  Dose reduction of Xanax discussed and encouraged  Past Medical History:  Diagnosis Date  . Anal fissure   . ANEMIA-NOS 09/18/2009   takes Ferrous Sulfate daily  . Anxiety    takes Xanax prn sleep and anxiety  . Cancer (HCC)    basal cell left cheek  . Chronic back pain    scoliosis/stenosis/spondylosis  . DEPRESSION 09/18/2009   takes Celexa daily  . DIABETES MELLITUS, TYPE II 09/18/2009   takes Amaryl daily and Metformin  . GERD 09/18/2009   takes Omeprazole daily  . Headache(784.0)    sinus HA  . Hemorrhoids   . Histoplasmosis    hx of  . History of bronchitis    last time around 1990  . History of colon polyps   . HYPERLIPIDEMIA 09/18/2009   takes Lipitor daily  . HYPERTENSION 09/18/2009   takes  Atenolol daily and Lisinopril  . HYPOTHYROIDISM 09/18/2009  . Insomnia    takes trazodone and ambien nightly  . Joint pain   . Joint swelling   . Morbid obesity (HCC)   . Obesity   . OSA (obstructive sleep apnea)    has a CPAP and sleep study report in epic from 2009  . OSTEOARTHRITIS 09/18/2009  . Peripheral neuropathy    takes Lyrica daily  . Shortness of breath    with exertion  . Skin spots, red    right above both elbows;pt states always there  . Spondylolisthesis at L1-L2 level   . Umbilical hernia   . Urinary frequency   . Urinary urgency      Social History   Socioeconomic History  . Marital status: Single    Spouse name: Not on file  . Number of children: Not on file  . Years of education: Not on file  . Highest education level: Not on file  Occupational History  . Occupation: retired  Engineer, productionocial Needs  . Financial resource strain: Not on file  . Food insecurity:    Worry: Not on file    Inability: Not on file  . Transportation needs:    Medical: Not on file    Non-medical: Not on file  Tobacco Use  . Smoking status: Former Smoker    Packs/day: 1.00    Years:  20.00    Pack years: 20.00    Types: Cigarettes    Last attempt to quit: 02/23/2004    Years since quitting: 13.5  . Smokeless tobacco: Never Used  . Tobacco comment: quit 2007  Substance and Sexual Activity  . Alcohol use: Yes    Alcohol/week: 0.0 oz    Comment: rare  . Drug use: No  . Sexual activity: Never    Birth control/protection: Post-menopausal  Lifestyle  . Physical activity:    Days per week: Not on file    Minutes per session: Not on file  . Stress: Not on file  Relationships  . Social connections:    Talks on phone: Not on file    Gets together: Not on file    Attends religious service: Not on file    Active member of club or organization: Not on file    Attends meetings of clubs or organizations: Not on file    Relationship status: Not on file  . Intimate partner violence:     Fear of current or ex partner: Not on file    Emotionally abused: Not on file    Physically abused: Not on file    Forced sexual activity: Not on file  Other Topics Concern  . Not on file  Social History Narrative  . Not on file    Past Surgical History:  Procedure Laterality Date  . ANTERIOR CERVICAL DECOMP/DISCECTOMY FUSION N/A 09/02/2014   Procedure: Cervical five- six  Anterior cervical decompression fusion;  Surgeon: Barnett Abu, MD;  Location: [facility];  Service: Neurosurgery;  Laterality: N/A;  C5-6 Anterior cervical decompression/diskectomy/fusion  . BACK SURGERY    . CARPAL TUNNEL RELEASE     RIGHT      2016   . COLONOSCOPY  2007  . ESOPHAGOGASTRODUODENOSCOPY  2007  . HEMORRHOID SURGERY  03/23/2011   Procedure: HEMORRHOIDECTOMY;  Surgeon: Clovis Pu. Cornett, MD;  Location: [facility];  Service: General;  Laterality: N/A;  lateral internal sphincterotomy and hemorrhoidectomy  . SPHINCTEROTOMY    . THORACIC DISCECTOMY N/A 12/15/2012   Procedure: Thoracic ten-eleven Thoracic laminectomy;  Surgeon: Barnett Abu, MD;  Location: [facility];  Service: Neurosurgery;  Laterality: N/A;  Thoracic ten-eleven Thoracic laminectomy  . tubal fulgaration  1991    Family History  Problem Relation Age of Onset  . Diabetes Father   . COPD Father   . Cancer Maternal Grandfather        stomach    Allergies  Allergen Reactions  . Duraprep [employer], Misc.] Itching and Rash    Irritation everywhere prep was used  . Hydromorphone Hcl     Makes crazy  . Morphine And Related Other (See Comments)    hallucinations    Current Outpatient Medications on File Prior to Visit  Medication Sig Dispense Refill  . ALPRAZolam (XANAX) 0.5 MG tablet Take 1 tablet (0.5 mg total) by mouth 2 (two) times daily as needed for anxiety. 60 tablet 3  . atenolol (TENORMIN) 25 MG tablet TAKE 1 TABLET BY MOUTH  DAILY 90 tablet 1  . atorvastatin (LIPITOR) 40 MG tablet TAKE 1 TABLET  BY MOUTH ONCE DAILY 90 tablet 3  . Calcium Carb-Cholecalciferol (CALCIUM 600/VITAMIN D3 PO) Take 1 tablet by mouth 2 (two) times daily.    . Carboxymethylcellulose Sodium (LUBRICANT EYE DROPS OP) Apply 2 drops to eye 4 (four) times daily as needed.    . citalopram (CELEXA) 40 MG tablet TAKE 1 TABLET  BY MOUTH  DAILY 90 tablet 2  . docusate sodium (COLACE) 100 MG capsule Take 100 mg by mouth 2 (two) times daily.    Marland Kitchen estradiol (ESTRACE) 0.1 MG/GM vaginal cream Place 1 Applicatorful vaginally 3 (three) times a week. 42.5 g 12  . gemfibrozil (LOPID) 600 MG tablet TAKE 1 TABLET BY MOUTH TWO  TIMES DAILY BEFORE MEALS 180 tablet 1  . hydrocortisone 2.5 % cream Apply 1 application topically 2 (two) times daily. 30 g 2  . Lancets (ONETOUCH ULTRASOFT) lancets Use daily 100 each 6  . levothyroxine (SYNTHROID, LEVOTHROID) 175 MCG tablet TAKE 1 TABLET BY MOUTH  DAILY BEFORE BREAKFAST 90 tablet 5  . lisinopril (PRINIVIL,ZESTRIL) 10 MG tablet TAKE 1 TABLET BY MOUTH ONCE DAILY 90 tablet 1  . loratadine (CLARITIN) 10 MG tablet Take 10 mg by mouth daily as needed for allergies.    . meloxicam (MOBIC) 15 MG tablet TAKE 1 TABLET BY MOUTH  DAILY 90 tablet 1  . metFORMIN (GLUCOPHAGE) 1000 MG tablet TAKE 1 TABLET BY MOUTH TWO  TIMES DAILY WITH MEALS 180 tablet 1  . Multiple Vitamin (MULTIVITAMIN) tablet Take 1 tablet by mouth daily.    . Omega-3 Fatty Acids (FISH OIL) 1200 MG CAPS Take 2 capsules by mouth 2 (two) times daily.    . ONE TOUCH ULTRA TEST test strip USE 1 DAILY AS DIRECTED 100 each 3  . pioglitazone (ACTOS) 15 MG tablet TAKE 1 TABLET BY MOUTH  DAILY 90 tablet 1  . pregabalin (LYRICA) 75 MG capsule Take 1 capsule (75 mg total) by mouth daily. 90 capsule 1  . rOPINIRole (REQUIP) 1 MG tablet Take 1 tablet (1 mg total) by mouth at bedtime. 90 tablet 4  . tiZANidine (ZANAFLEX) 4 MG capsule Take 1 capsule (4 mg total) by mouth 3 (three) times daily. 60 capsule 3  . tobramycin (TOBREX) 0.3 % ophthalmic ointment  Place 1 application into the right eye. Apply the day before, the day of, and the day after eye injections.    . traZODone (DESYREL) 100 MG tablet TAKE 2 TABLETS BY MOUTH AT  BEDTIME 180 tablet 0   No current facility-administered medications on file prior to visit.     BP 100/70 (BP Location: Right Arm, Patient Position: Sitting, Cuff Size: Large)   Pulse 75   Temp 98.5 F (36.9 C) (Oral)   Wt 191 lb (86.6 kg)   SpO2 94%   BMI 36.09 kg/m     Review of Systems  Constitutional: Negative.   HENT: Negative for congestion, dental problem, hearing loss, rhinorrhea, sinus pressure, sore throat and tinnitus.   Eyes: Negative for pain, discharge and visual disturbance.  Respiratory: Negative for cough and shortness of breath.   Cardiovascular: Negative for chest pain, palpitations and leg swelling.  Gastrointestinal: Negative for abdominal distention, abdominal pain, blood in stool, constipation, diarrhea, nausea and vomiting.  Genitourinary: Negative for difficulty urinating, dysuria, flank pain, frequency, hematuria, pelvic pain, urgency, vaginal bleeding, vaginal discharge and vaginal pain.  Musculoskeletal: Positive for arthralgias, back pain and joint swelling. Negative for gait problem.  Skin: Negative for rash.  Neurological: Negative for dizziness, syncope, speech difficulty, weakness, numbness and headaches.  Hematological: Negative for adenopathy.  Psychiatric/Behavioral: Negative for agitation, behavioral problems and dysphoric mood. The patient is nervous/anxious.        Objective:   Physical Exam  Constitutional: She appears well-developed and well-nourished.  Blood pressure low normal  Cardiovascular: Normal rate and regular rhythm.  Pulmonary/Chest: Effort  normal and breath sounds normal.          Assessment & Plan:    Encounter for chronic pain management (G89.29) Narcotic use  (711.90) Pain management contract signed (Z02.89)   Diabetes mellitus.   Well-controlled Essential hypertension well-controlled Anxiety disorder.  Drug drug interactions with narcotics and sedatives discussed.  The patient make an effort to use 1/2 tablet rather than a full tablet as needed.  Follow-up in 3 months or as needed  GERD. Antireflux diet encouraged.  Protonix refilled    Gordy Savers

## 2017-08-29 NOTE — Patient Instructions (Signed)
Avoids foods high in acid such as tomatoes citrus juices, and spicy foods.  Avoid eating within two hours of lying down or before exercising.  Do not overheat.  Try smaller more frequent meals.   Attempt to decrease alprazolam to 1/2 tablet twice daily as needed  Return in 3 months for follow-up

## 2017-09-07 ENCOUNTER — Other Ambulatory Visit: Payer: Self-pay | Admitting: Internal Medicine

## 2017-10-04 ENCOUNTER — Other Ambulatory Visit: Payer: Self-pay | Admitting: Internal Medicine

## 2017-10-05 ENCOUNTER — Other Ambulatory Visit: Payer: Self-pay | Admitting: Internal Medicine

## 2017-10-05 NOTE — Telephone Encounter (Signed)
Dr. Kirtland BouchardK, please advise if ok to give refills for the tizanidine.  This was last refilled on 03/03/2017 for #60 with 3 refiils.  Last ov was 08/29/2017.  Thank you.

## 2017-10-18 ENCOUNTER — Other Ambulatory Visit: Payer: Self-pay | Admitting: Internal Medicine

## 2017-10-29 ENCOUNTER — Other Ambulatory Visit: Payer: Self-pay | Admitting: Internal Medicine

## 2017-10-31 ENCOUNTER — Telehealth: Payer: Self-pay | Admitting: Internal Medicine

## 2017-10-31 MED ORDER — ATORVASTATIN CALCIUM 40 MG PO TABS: 40.0000 mg | ORAL_TABLET | Freq: Every day | ORAL | 3 refills | Status: DC

## 2017-10-31 MED ORDER — HYDROCORTISONE 2.5 % EX CREA: TOPICAL_CREAM | CUTANEOUS | 0 refills | Status: DC

## 2017-10-31 NOTE — Telephone Encounter (Signed)
Copied from CRM [id number]. Topic: Quick Communication - Rx Refill/Question >> Oct 31, 2017 12:52 PM Angela Nevin wrote: Medication: pregabalin (LYRICA) 75 MG capsule [[id number]]   Pt states that she received a letter from [facility] stating that Lyrica is no longer covered. Pt would like to know if a generic form of this medication could be called in as she is completely out of this medication. Please advise.   Preferred Pharmacy (with phone number or street name): [facility] [address] [place] - [address] [phone] (Phone) [phone] (Fax)

## 2017-11-01 ENCOUNTER — Other Ambulatory Visit: Payer: Self-pay | Admitting: Internal Medicine

## 2017-11-01 NOTE — Telephone Encounter (Signed)
Refill of lyrica  LOV 08/29/17 Dr. Amador Cunas  [facility] 09/20/16  #90  1 refill   [facility] [address], [place] - [address]           [phone] (Phone) [phone] (Fax)

## 2017-11-01 NOTE — Telephone Encounter (Signed)
Spoke to pt and advise her to call her insurance company to see what is covered. Pt was told to return phone call once she finds out so that we could refill it. Pt needs her handicap placard renewed. Form placed in Dr.Kwiatkowski's folder.

## 2017-11-01 NOTE — Telephone Encounter (Signed)
Copied from CRM [id number]. Topic: Quick Communication - Rx Refill/Question >> Nov 01, 2017  4:11 PM Tamela Oddi wrote: Medication:  pregabalin (LYRICA) 75 MG capsule   Patient called to request a refill for the above medication.  CB# [phone]  Preferred Pharmacy (with phone number or street name): [facility] [address] [place] - [address] [phone] (Phone) [phone] (Fax)

## 2017-11-02 DIAGNOSIS — M170 Bilateral primary osteoarthritis of knee: Secondary | ICD-10-CM | POA: Diagnosis not present

## 2017-11-03 ENCOUNTER — Encounter (HOSPITAL_COMMUNITY): Payer: Self-pay | Admitting: *Deleted

## 2017-11-03 MED ORDER — PREGABALIN 75 MG PO CAPS: 75.0000 mg | ORAL_CAPSULE | Freq: Every day | ORAL | 1 refills | Status: DC

## 2017-11-21 ENCOUNTER — Other Ambulatory Visit (HOSPITAL_COMMUNITY): Payer: Medicare Other

## 2017-11-22 ENCOUNTER — Other Ambulatory Visit: Payer: Self-pay | Admitting: Internal Medicine

## 2017-11-30 ENCOUNTER — Other Ambulatory Visit: Payer: Self-pay | Admitting: Family Medicine

## 2017-11-30 ENCOUNTER — Other Ambulatory Visit: Payer: Self-pay

## 2017-11-30 ENCOUNTER — Encounter: Payer: Medicare Other | Admitting: Family Medicine

## 2017-11-30 MED ORDER — ALPRAZOLAM 0.5 MG PO TABS: ORAL_TABLET | ORAL | 1 refills | Status: DC

## 2017-11-30 MED ORDER — ROPINIROLE HCL 1 MG PO TABS: 1.0000 mg | ORAL_TABLET | Freq: Every day | ORAL | 4 refills | Status: DC

## 2017-11-30 MED ORDER — HYDROCODONE-ACETAMINOPHEN 7.5-325 MG PO TABS: 1.0000 | ORAL_TABLET | Freq: Four times a day (QID) | ORAL | 0 refills | Status: DC | PRN

## 2017-12-08 NOTE — Patient Instructions (Addendum)
Katie Booker  [birth year]    Your procedure is scheduled on:  12-27-2017     Report to [facility] Main  Entrance,  Report to admitting at  5:30 AM     Call this number if you have problems the morning of surgery [phone]       Remember: Do not eat food or drink liquids :After Midnight.                                       BRUSH YOUR TEETH MORNING OF SURGERY AND RINSE YOUR MOUTH OUT, NO CHEWING GUM CANDY OR MINTS.     Take these medicines the morning of surgery with A SIP OF WATER:  Citalopram(Celexa), Atenolol, Levothyroxine, Pantroprazole,                                And Hydrocodone, Tizanidine, Alprazolam if needed                                                                                      DO NOT TAKE ANY DIABETIC MEDICATIONS DAY OF YOUR SURGERY                                 You may not have any metal on your body including hair pins and              piercings                Do not wear jewelry, make-up, lotions, powders or perfumes, deodorant               Do not wear nail polish.  Do not shave  48 hours prior to surgery.              Men may shave face and neck.    Do not bring valuables to the hospital. [facility] IS NOT             RESPONSIBLE   FOR VALUABLES.  Contacts, dentures or bridgework may not be worn into surgery.  Leave suitcase in the car. After surgery it may be brought to your room.    Special Instructions:   BRING CPAP MASK AND TUBING WITH YOU DAY OF SURGERY             [facility]  _____________________________________________________________________             [facility] - Preparing for Surgery Before surgery, you can play an important role.  Because skin is not sterile, your skin needs to be as free of germs as possible.  You can reduce the number of germs on your skin by washing with CHG (chlorahexidine gluconate) soap before surgery.  CHG is an antiseptic cleaner which kills germs and bonds with  the skin to continue killing germs even after washing. Please DO NOT use if  you have an allergy to CHG or antibacterial soaps.  If your skin becomes reddened/irritated stop using the CHG and inform your nurse when you arrive at Short Stay. Do not shave (including legs and underarms) for at least 48 hours prior to the first CHG shower.  You may shave your face/neck. Please follow these instructions carefully:  1.  Shower with CHG Soap the night before surgery and the  morning of Surgery.  2.  If you choose to wash your hair, wash your hair first as usual with your  normal  shampoo.  3.  After you shampoo, rinse your hair and body thoroughly to remove the  shampoo.                            4.  Use CHG as you would any other liquid soap.  You can apply chg directly  to the skin and wash                       Gently with a scrungie or clean washcloth.  5.  Apply the CHG Soap to your body ONLY FROM THE NECK DOWN.   Do not use on face/ open                           Wound or open sores. Avoid contact with eyes, ears mouth and genitals (private parts).                       Wash face,  Genitals (private parts) with your normal soap.             6.  Wash thoroughly, paying special attention to the area where your surgery  will be performed.  7.  Thoroughly rinse your body with warm water from the neck down.  8.  DO NOT shower/wash with your normal soap after using and rinsing off  the CHG Soap.             9.  Pat yourself dry with a clean towel.            10.  Wear clean pajamas.            11.  Place clean sheets on your bed the night of your first shower and do not  sleep with pets. Day of Surgery : Do not apply any lotions/deodorants the morning of surgery.  Please wear clean clothes to the hospital/surgery center.  FAILURE TO FOLLOW THESE INSTRUCTIONS MAY RESULT IN THE CANCELLATION OF YOUR SURGERY PATIENT SIGNATURE_________________________________  NURSE  SIGNATURE__________________________________  ________________________________________________________________________     Katie Booker  An incentive spirometer is a tool that can help keep your lungs clear and active. This tool measures how well you are filling your lungs with each breath. Taking long deep breaths may help reverse or decrease the chance of developing breathing (pulmonary) problems (especially infection) following:  A long period of time when you are unable to move or be active. BEFORE THE PROCEDURE   If the spirometer includes an indicator to show your best effort, your nurse or respiratory therapist will set it to a desired goal.  If possible, sit up straight or lean slightly forward. Try not to slouch.  Hold the incentive spirometer in an upright position. INSTRUCTIONS FOR USE  1. Sit on the edge of your bed if possible, or sit up  as far as you can in bed or on a chair. 2. Hold the incentive spirometer in an upright position. 3. Breathe out normally. 4. Place the mouthpiece in your mouth and seal your lips tightly around it. 5. Breathe in slowly and as deeply as possible, raising the piston or the ball toward the top of the column. 6. Hold your breath for 3-5 seconds or for as long as possible. Allow the piston or ball to fall to the bottom of the column. 7. Remove the mouthpiece from your mouth and breathe out normally. 8. Rest for a few seconds and repeat Steps 1 through 7 at least 10 times every 1-2 hours when you are awake. Take your time and take a few normal breaths between deep breaths. 9. The spirometer may include an indicator to show your best effort. Use the indicator as a goal to work toward during each repetition. 10. After each set of 10 deep breaths, practice coughing to be sure your lungs are clear. If you have an incision (the cut made at the time of surgery), support your incision when coughing by placing a pillow or rolled up towels firmly  against it. Once you are able to get out of bed, walk around indoors and cough well. You may stop using the incentive spirometer when instructed by your caregiver.  RISKS AND COMPLICATIONS  Take your time so you do not get dizzy or light-headed.  If you are in pain, you may need to take or ask for pain medication before doing incentive spirometry. It is harder to take a deep breath if you are having pain. AFTER USE  Rest and breathe slowly and easily.  It can be helpful to keep track of a log of your progress. Your caregiver can provide you with a simple table to help with this. If you are using the spirometer at home, follow these instructions: SEEK MEDICAL CARE IF:   You are having difficultly using the spirometer.  You have trouble using the spirometer as often as instructed.  Your pain medication is not giving enough relief while using the spirometer.  You develop fever of 100.5 F (38.1 C) or higher. SEEK IMMEDIATE MEDICAL CARE IF:   You cough up bloody sputum that had not been present before.  You develop fever of 102 F (38.9 C) or greater.  You develop worsening pain at or near the incision site. MAKE SURE YOU:   Understand these instructions.  Will watch your condition.  Will get help right away if you are not doing well or get worse. Document Released: 06/21/2006 Document Revised: 05/03/2011 Document Reviewed: 08/22/2006 [facility] Patient Information 2014 [place], [place].   ________________________________________________________________________

## 2017-12-14 ENCOUNTER — Other Ambulatory Visit: Payer: Self-pay | Admitting: Orthopedic Surgery

## 2017-12-14 DIAGNOSIS — H353123 Nonexudative age-related macular degeneration, left eye, advanced atrophic without subfoveal involvement: Secondary | ICD-10-CM | POA: Diagnosis not present

## 2017-12-14 DIAGNOSIS — H35372 Puckering of macula, left eye: Secondary | ICD-10-CM | POA: Diagnosis not present

## 2017-12-14 DIAGNOSIS — H353212 Exudative age-related macular degeneration, right eye, with inactive choroidal neovascularization: Secondary | ICD-10-CM | POA: Diagnosis not present

## 2017-12-14 DIAGNOSIS — H35423 Microcystoid degeneration of retina, bilateral: Secondary | ICD-10-CM | POA: Diagnosis not present

## 2017-12-14 LAB — HM DIABETES EYE EXAM

## 2017-12-17 ENCOUNTER — Other Ambulatory Visit: Payer: Self-pay | Admitting: Internal Medicine

## 2017-12-19 ENCOUNTER — Other Ambulatory Visit: Payer: Self-pay

## 2017-12-19 ENCOUNTER — Encounter (HOSPITAL_COMMUNITY): Payer: Self-pay

## 2017-12-19 ENCOUNTER — Encounter (HOSPITAL_COMMUNITY)
Admission: RE | Admit: 2017-12-19 | Discharge: 2017-12-19 | Disposition: A | Discharge status: Home / Self Care | Payer: Medicare Other | Source: Ambulatory Visit | Attending: Orthopedic Surgery | Admitting: Orthopedic Surgery

## 2017-12-19 DIAGNOSIS — Z01818 Encounter for other preprocedural examination: Secondary | ICD-10-CM | POA: Diagnosis not present

## 2017-12-19 HISTORY — DX: Paresthesia of skin: R20.2

## 2017-12-19 HISTORY — DX: Other specified postprocedural states: Z98.890

## 2017-12-19 HISTORY — DX: Other specified postprocedural states: Z85.828

## 2017-12-19 HISTORY — DX: Headache, unspecified: R51.9

## 2017-12-19 HISTORY — DX: Rash and other nonspecific skin eruption: R21

## 2017-12-19 HISTORY — DX: Hyperlipidemia, unspecified: E78.5

## 2017-12-19 HISTORY — DX: Headache: R51

## 2017-12-19 HISTORY — DX: Unspecified osteoarthritis, unspecified site: M19.90

## 2017-12-19 HISTORY — DX: Postprocedural hypothyroidism: E89.0

## 2017-12-19 HISTORY — DX: Essential (primary) hypertension: I10

## 2017-12-19 HISTORY — DX: Low back pain: M54.5

## 2017-12-19 HISTORY — DX: Major depressive disorder, single episode, unspecified: F32.9

## 2017-12-19 HISTORY — DX: Depression, unspecified: F32.A

## 2017-12-19 HISTORY — DX: Adverse effect of unspecified anesthetic, initial encounter: T41.45XA

## 2017-12-19 HISTORY — DX: Iron deficiency anemia, unspecified: D50.9

## 2017-12-19 HISTORY — DX: Restless legs syndrome: G25.81

## 2017-12-19 HISTORY — DX: Personal history of other endocrine, nutritional and metabolic disease: Z86.39

## 2017-12-19 HISTORY — DX: Personal history of other mental and behavioral disorders: Z86.59

## 2017-12-19 HISTORY — DX: Exudative age-related macular degeneration, right eye, stage unspecified: H35.3210

## 2017-12-19 HISTORY — DX: Other complications of anesthesia, initial encounter: T88.59XA

## 2017-12-19 HISTORY — DX: Cervicalgia: M54.2

## 2017-12-19 HISTORY — DX: Dyspnea, unspecified: R06.00

## 2017-12-19 HISTORY — DX: Other forms of dyspnea: R06.09

## 2017-12-19 HISTORY — DX: Type 2 diabetes mellitus without complications: E11.9

## 2017-12-19 HISTORY — DX: Other chronic pain: G89.29

## 2017-12-19 HISTORY — DX: Other constipation: K59.09

## 2017-12-19 HISTORY — DX: Low back pain, unspecified: M54.50

## 2017-12-19 LAB — CBC
HCT: 37.1 % (ref 36.0–46.0)
HEMOGLOBIN: 11.5 g/dL — AB (ref 12.0–15.0)
MCH: 27.1 pg (ref 26.0–34.0)
MCHC: 31 g/dL (ref 30.0–36.0)
MCV: 87.3 fL (ref 80.0–100.0)
NRBC: 0 % (ref 0.0–0.2)
Platelets: 179 10*3/uL (ref 150–400)
RBC: 4.25 MIL/uL (ref 3.87–5.11)
RDW: 13 % (ref 11.5–15.5)
WBC: 6.1 10*3/uL (ref 4.0–10.5)

## 2017-12-19 LAB — BASIC METABOLIC PANEL
ANION GAP: 10 (ref 5–15)
BUN: 28 mg/dL — ABNORMAL HIGH (ref 8–23)
CHLORIDE: 103 mmol/L (ref 98–111)
CO2: 26 mmol/L (ref 22–32)
Calcium: 9.3 mg/dL (ref 8.9–10.3)
Creatinine, Ser: 1.17 mg/dL — ABNORMAL HIGH (ref 0.44–1.00)
GFR calc non Af Amer: 46 mL/min — ABNORMAL LOW (ref >60)
GFR, EST AFRICAN AMERICAN: 53 mL/min — AB (ref >60)
Glucose, Bld: 94 mg/dL (ref 70–99)
POTASSIUM: 4.8 mmol/L (ref 3.5–5.1)
Sodium: 139 mmol/L (ref 135–145)

## 2017-12-19 LAB — HEMOGLOBIN A1C
Hgb A1c MFr Bld: 5.9 % — ABNORMAL HIGH (ref 4.8–5.6)
MEAN PLASMA GLUCOSE: 122.63 mg/dL

## 2017-12-19 LAB — SURGICAL PCR SCREEN
MRSA, PCR: NEGATIVE
Staphylococcus aureus: NEGATIVE

## 2017-12-19 LAB — GLUCOSE, CAPILLARY: GLUCOSE-CAPILLARY: 82 mg/dL (ref 70–99)

## 2017-12-20 NOTE — Telephone Encounter (Signed)
Pt has not Established care with dr Salomon Fick, scheduled to establish with dr Ardyth Harps in Jan 13, 2018. Please see if Dr Tawanna Cooler will approve this Rx since he refilled the Pain med.

## 2017-12-20 NOTE — Telephone Encounter (Signed)
Dr.Banks Pt  

## 2017-12-21 NOTE — Progress Notes (Signed)
Final EKG dated 12-19-2017 in epic. 

## 2017-12-24 ENCOUNTER — Other Ambulatory Visit: Payer: Self-pay | Admitting: Internal Medicine

## 2017-12-26 ENCOUNTER — Encounter (HOSPITAL_COMMUNITY): Payer: Self-pay | Admitting: Anesthesiology

## 2017-12-26 NOTE — Anesthesia Preprocedure Evaluation (Addendum)
Anesthesia Evaluation  Patient identified by MRN, date of birth, ID band Patient awake    Reviewed: Allergy & Precautions, NPO status , Patient's Chart, lab work & pertinent test results, reviewed documented beta blocker date and time   History of Anesthesia Complications (+) history of anesthetic complications  Airway Mallampati: IV  TM Distance: >3 FB Neck ROM: Full    Dental no notable dental hx. (+) Teeth Intact   Pulmonary sleep apnea , former smoker,    Pulmonary exam normal breath sounds clear to auscultation       Cardiovascular hypertension, Pt. on medications and Pt. 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   Abdominal (+) + obese,   Peds  Hematology  (+) anemia ,   Anesthesia Other Findings   Reproductive/Obstetrics                           Anesthesia Physical Anesthesia Plan  ASA: III  Anesthesia Plan: General   Post-op Pain Management:  Regional for Post-op pain   Induction: Intravenous  PONV Risk Score and Plan: 4 or greater and Ondansetron, Dexamethasone and Treatment may vary due to age or medical condition  Airway Management Planned: LMA  Additional Equipment:   Intra-op Plan:   Post-operative Plan: Extubation in OR  Informed Consent: I have reviewed the patients  History and Physical, chart, labs and discussed the procedure including the risks, benefits and alternatives for the proposed anesthesia with the patient or authorized representative who has indicated his/her understanding and acceptance.   Dental advisory given  Plan Discussed with: CRNA and Surgeon  Anesthesia Plan Comments:        Anesthesia Quick Evaluation

## 2017-12-27 ENCOUNTER — Other Ambulatory Visit: Payer: Self-pay

## 2017-12-27 ENCOUNTER — Ambulatory Visit (HOSPITAL_COMMUNITY): Payer: Medicare Other | Admitting: Anesthesiology

## 2017-12-27 ENCOUNTER — Observation Stay (HOSPITAL_COMMUNITY): Payer: Medicare Other

## 2017-12-27 ENCOUNTER — Observation Stay (HOSPITAL_COMMUNITY)
Admission: RE | Admit: 2017-12-27 | Discharge: 2017-12-28 | Disposition: A | Discharge status: Home / Self Care | Payer: Medicare Other | Source: Other Acute Inpatient Hospital | Attending: Orthopedic Surgery | Admitting: Orthopedic Surgery

## 2017-12-27 ENCOUNTER — Encounter (HOSPITAL_COMMUNITY): Payer: Self-pay

## 2017-12-27 ENCOUNTER — Encounter (HOSPITAL_COMMUNITY)
Admission: RE | Disposition: A | Discharge status: Home / Self Care | Payer: Self-pay | Source: Other Acute Inpatient Hospital | Attending: Orthopedic Surgery

## 2017-12-27 DIAGNOSIS — Z791 Long term (current) use of non-steroidal anti-inflammatories (NSAID): Secondary | ICD-10-CM | POA: Diagnosis not present

## 2017-12-27 DIAGNOSIS — F419 Anxiety disorder, unspecified: Secondary | ICD-10-CM | POA: Diagnosis not present

## 2017-12-27 DIAGNOSIS — Z85828 Personal history of other malignant neoplasm of skin: Secondary | ICD-10-CM | POA: Insufficient documentation

## 2017-12-27 DIAGNOSIS — Z87891 Personal history of nicotine dependence: Secondary | ICD-10-CM | POA: Insufficient documentation

## 2017-12-27 DIAGNOSIS — M1712 Unilateral primary osteoarthritis, left knee: Principal | ICD-10-CM

## 2017-12-27 DIAGNOSIS — Z885 Allergy status to narcotic agent status: Secondary | ICD-10-CM | POA: Insufficient documentation

## 2017-12-27 DIAGNOSIS — E890 Postprocedural hypothyroidism: Secondary | ICD-10-CM | POA: Diagnosis not present

## 2017-12-27 DIAGNOSIS — Z96652 Presence of left artificial knee joint: Secondary | ICD-10-CM

## 2017-12-27 DIAGNOSIS — E785 Hyperlipidemia, unspecified: Secondary | ICD-10-CM | POA: Diagnosis not present

## 2017-12-27 DIAGNOSIS — Z79818 Long term (current) use of other agents affecting estrogen receptors and estrogen levels: Secondary | ICD-10-CM | POA: Insufficient documentation

## 2017-12-27 DIAGNOSIS — E6609 Other obesity due to excess calories: Secondary | ICD-10-CM | POA: Insufficient documentation

## 2017-12-27 DIAGNOSIS — E119 Type 2 diabetes mellitus without complications: Secondary | ICD-10-CM | POA: Insufficient documentation

## 2017-12-27 DIAGNOSIS — Z6834 Body mass index (BMI) 34.0-34.9, adult: Secondary | ICD-10-CM | POA: Insufficient documentation

## 2017-12-27 DIAGNOSIS — Z96659 Presence of unspecified artificial knee joint: Secondary | ICD-10-CM

## 2017-12-27 DIAGNOSIS — G2581 Restless legs syndrome: Secondary | ICD-10-CM | POA: Diagnosis not present

## 2017-12-27 DIAGNOSIS — G4733 Obstructive sleep apnea (adult) (pediatric): Secondary | ICD-10-CM | POA: Diagnosis present

## 2017-12-27 DIAGNOSIS — F329 Major depressive disorder, single episode, unspecified: Secondary | ICD-10-CM | POA: Diagnosis not present

## 2017-12-27 DIAGNOSIS — K5909 Other constipation: Secondary | ICD-10-CM | POA: Insufficient documentation

## 2017-12-27 DIAGNOSIS — Z981 Arthrodesis status: Secondary | ICD-10-CM | POA: Insufficient documentation

## 2017-12-27 DIAGNOSIS — K219 Gastro-esophageal reflux disease without esophagitis: Secondary | ICD-10-CM | POA: Insufficient documentation

## 2017-12-27 DIAGNOSIS — Z79899 Other long term (current) drug therapy: Secondary | ICD-10-CM | POA: Diagnosis not present

## 2017-12-27 DIAGNOSIS — Z471 Aftercare following joint replacement surgery: Secondary | ICD-10-CM | POA: Diagnosis not present

## 2017-12-27 DIAGNOSIS — Z79890 Hormone replacement therapy (postmenopausal): Secondary | ICD-10-CM | POA: Diagnosis not present

## 2017-12-27 DIAGNOSIS — G4700 Insomnia, unspecified: Secondary | ICD-10-CM | POA: Insufficient documentation

## 2017-12-27 DIAGNOSIS — G629 Polyneuropathy, unspecified: Secondary | ICD-10-CM | POA: Insufficient documentation

## 2017-12-27 DIAGNOSIS — E1140 Type 2 diabetes mellitus with diabetic neuropathy, unspecified: Secondary | ICD-10-CM | POA: Diagnosis not present

## 2017-12-27 DIAGNOSIS — I10 Essential (primary) hypertension: Secondary | ICD-10-CM | POA: Diagnosis not present

## 2017-12-27 DIAGNOSIS — G8918 Other acute postprocedural pain: Secondary | ICD-10-CM | POA: Diagnosis not present

## 2017-12-27 DIAGNOSIS — E039 Hypothyroidism, unspecified: Secondary | ICD-10-CM | POA: Diagnosis not present

## 2017-12-27 DIAGNOSIS — Z7984 Long term (current) use of oral hypoglycemic drugs: Secondary | ICD-10-CM | POA: Insufficient documentation

## 2017-12-27 HISTORY — PX: PARTIAL KNEE ARTHROPLASTY: SHX2174

## 2017-12-27 LAB — GLUCOSE, CAPILLARY
GLUCOSE-CAPILLARY: 95 mg/dL (ref 70–99)
Glucose-Capillary: 129 mg/dL — ABNORMAL HIGH (ref 70–99)
Glucose-Capillary: 134 mg/dL — ABNORMAL HIGH (ref 70–99)
Glucose-Capillary: 189 mg/dL — ABNORMAL HIGH (ref 70–99)
Glucose-Capillary: 132 mg/dL — ABNORMAL HIGH (ref 70–99)

## 2017-12-27 SURGERY — ARTHROPLASTY, KNEE, UNICOMPARTMENTAL
Anesthesia: General | Site: Knee | Laterality: Left

## 2017-12-27 MED ORDER — DEXAMETHASONE SODIUM PHOSPHATE 10 MG/ML IJ SOLN
10.0000 mg | Freq: Once | INTRAMUSCULAR | Status: AC
  Administered 2017-12-28: 10 mg via INTRAVENOUS
  Filled 2017-12-27: qty 1

## 2017-12-27 MED ORDER — SODIUM CHLORIDE 0.9 % IR SOLN
Status: DC | PRN
  Administered 2017-12-27: 1000 mL

## 2017-12-27 MED ORDER — LISINOPRIL 10 MG PO TABS
10.0000 mg | ORAL_TABLET | Freq: Every day | ORAL | Status: DC
  Filled 2017-12-27 (×2): qty 1

## 2017-12-27 MED ORDER — ZOLPIDEM TARTRATE 5 MG PO TABS: 5.0000 mg | ORAL_TABLET | Freq: Every evening | ORAL | Status: DC | PRN

## 2017-12-27 MED ORDER — ACETAMINOPHEN 325 MG PO TABS: 325.0000 mg | ORAL_TABLET | Freq: Four times a day (QID) | ORAL | Status: DC | PRN

## 2017-12-27 MED ORDER — ALPRAZOLAM 0.5 MG PO TABS
0.5000 mg | ORAL_TABLET | Freq: Two times a day (BID) | ORAL | Status: DC | PRN
  Administered 2017-12-27 – 2017-12-28 (×2): 0.5 mg via ORAL
  Filled 2017-12-27 (×2): qty 1

## 2017-12-27 MED ORDER — ESTRADIOL 0.1 MG/GM VA CREA: 1.0000 | TOPICAL_CREAM | VAGINAL | Status: DC

## 2017-12-27 MED ORDER — MORPHINE SULFATE (PF) 4 MG/ML IV SOLN: 2.0000 mg | INTRAVENOUS | Status: DC | PRN

## 2017-12-27 MED ORDER — GEMFIBROZIL 600 MG PO TABS
600.0000 mg | ORAL_TABLET | Freq: Two times a day (BID) | ORAL | Status: DC
  Administered 2017-12-27 – 2017-12-28 (×2): 600 mg via ORAL
  Filled 2017-12-27 (×3): qty 1

## 2017-12-27 MED ORDER — KETOROLAC TROMETHAMINE 30 MG/ML IJ SOLN
INTRAMUSCULAR | Status: DC | PRN
  Administered 2017-12-27: 30 mg

## 2017-12-27 MED ORDER — CARBOXYMETHYLCELLULOSE SODIUM 0.5 % OP SOLN: 2.0000 [drp] | Freq: Four times a day (QID) | OPHTHALMIC | Status: DC | PRN

## 2017-12-27 MED ORDER — ONDANSETRON HCL 4 MG/2ML IJ SOLN
INTRAMUSCULAR | Status: DC | PRN
  Administered 2017-12-27: 4 mg via INTRAVENOUS

## 2017-12-27 MED ORDER — ONDANSETRON HCL 4 MG/2ML IJ SOLN
INTRAMUSCULAR | Status: AC
  Filled 2017-12-27: qty 2

## 2017-12-27 MED ORDER — 0.9 % SODIUM CHLORIDE (POUR BTL) OPTIME
TOPICAL | Status: DC | PRN
  Administered 2017-12-27: 1000 mL

## 2017-12-27 MED ORDER — PROPOFOL 10 MG/ML IV BOLUS
INTRAVENOUS | Status: DC | PRN
  Administered 2017-12-27: 120 mg via INTRAVENOUS

## 2017-12-27 MED ORDER — ONDANSETRON HCL 4 MG PO TABS: 4.0000 mg | ORAL_TABLET | Freq: Three times a day (TID) | ORAL | 0 refills | Status: DC | PRN

## 2017-12-27 MED ORDER — PIOGLITAZONE HCL 15 MG PO TABS
15.0000 mg | ORAL_TABLET | Freq: Every day | ORAL | Status: DC
  Administered 2017-12-28: 15 mg via ORAL
  Filled 2017-12-27: qty 1

## 2017-12-27 MED ORDER — METOCLOPRAMIDE HCL 5 MG/ML IJ SOLN: 10.0000 mg | Freq: Once | INTRAMUSCULAR | Status: DC | PRN

## 2017-12-27 MED ORDER — PREGABALIN 75 MG PO CAPS
75.0000 mg | ORAL_CAPSULE | Freq: Every day | ORAL | Status: DC
  Administered 2017-12-28: 75 mg via ORAL
  Filled 2017-12-27: qty 1

## 2017-12-27 MED ORDER — CEFAZOLIN SODIUM-DEXTROSE 2-4 GM/100ML-% IV SOLN
2.0000 g | Freq: Four times a day (QID) | INTRAVENOUS | Status: AC
  Administered 2017-12-27 (×2): 2 g via INTRAVENOUS
  Filled 2017-12-27 (×2): qty 100

## 2017-12-27 MED ORDER — HYDROCORTISONE 2.5 % EX CREA: 1.0000 "application " | TOPICAL_CREAM | Freq: Two times a day (BID) | CUTANEOUS | Status: DC | PRN

## 2017-12-27 MED ORDER — MAGNESIUM CITRATE PO SOLN: 1.0000 | Freq: Once | ORAL | Status: DC | PRN

## 2017-12-27 MED ORDER — BUPIVACAINE HCL (PF) 0.25 % IJ SOLN
INTRAMUSCULAR | Status: DC | PRN
  Administered 2017-12-27: 20 mL

## 2017-12-27 MED ORDER — PHENOL 1.4 % MT LIQD: 1.0000 | OROMUCOSAL | Status: DC | PRN

## 2017-12-27 MED ORDER — LIDOCAINE 2% (20 MG/ML) 5 ML SYRINGE
INTRAMUSCULAR | Status: AC
  Filled 2017-12-27: qty 5

## 2017-12-27 MED ORDER — ATENOLOL 25 MG PO TABS
25.0000 mg | ORAL_TABLET | Freq: Every day | ORAL | Status: DC
  Administered 2017-12-28: 25 mg via ORAL
  Filled 2017-12-27: qty 1

## 2017-12-27 MED ORDER — FENTANYL CITRATE (PF) 250 MCG/5ML IJ SOLN
INTRAMUSCULAR | Status: AC
  Filled 2017-12-27: qty 5

## 2017-12-27 MED ORDER — METFORMIN HCL 500 MG PO TABS
1000.0000 mg | ORAL_TABLET | Freq: Two times a day (BID) | ORAL | Status: DC
  Administered 2017-12-27 – 2017-12-28 (×2): 1000 mg via ORAL
  Filled 2017-12-27 (×2): qty 2

## 2017-12-27 MED ORDER — DOCUSATE SODIUM 100 MG PO CAPS
100.0000 mg | ORAL_CAPSULE | Freq: Two times a day (BID) | ORAL | Status: DC
  Administered 2017-12-27: 100 mg via ORAL
  Filled 2017-12-27 (×2): qty 1

## 2017-12-27 MED ORDER — MENTHOL 3 MG MT LOZG: 1.0000 | LOZENGE | OROMUCOSAL | Status: DC | PRN

## 2017-12-27 MED ORDER — ONDANSETRON HCL 4 MG PO TABS: 4.0000 mg | ORAL_TABLET | Freq: Four times a day (QID) | ORAL | Status: DC | PRN

## 2017-12-27 MED ORDER — EPHEDRINE SULFATE 50 MG/ML IJ SOLN
INTRAMUSCULAR | Status: DC | PRN
  Administered 2017-12-27: 5 mg via INTRAVENOUS
  Administered 2017-12-27: 10 mg via INTRAVENOUS
  Administered 2017-12-27: 5 mg via INTRAVENOUS

## 2017-12-27 MED ORDER — PROPOFOL 10 MG/ML IV BOLUS
INTRAVENOUS | Status: AC
  Filled 2017-12-27: qty 20

## 2017-12-27 MED ORDER — ROPINIROLE HCL 1 MG PO TABS
1.0000 mg | ORAL_TABLET | Freq: Every day | ORAL | Status: DC
  Administered 2017-12-27: 1 mg via ORAL
  Filled 2017-12-27: qty 1

## 2017-12-27 MED ORDER — ATORVASTATIN CALCIUM 40 MG PO TABS
40.0000 mg | ORAL_TABLET | Freq: Every day | ORAL | Status: DC
  Administered 2017-12-28: 40 mg via ORAL
  Filled 2017-12-27: qty 1

## 2017-12-27 MED ORDER — ADULT MULTIVITAMIN W/MINERALS CH
1.0000 | ORAL_TABLET | Freq: Every day | ORAL | Status: DC
  Administered 2017-12-28: 1 via ORAL
  Filled 2017-12-27: qty 1

## 2017-12-27 MED ORDER — OXYCODONE HCL 5 MG PO TABS
10.0000 mg | ORAL_TABLET | ORAL | Status: DC | PRN
  Administered 2017-12-27 – 2017-12-28 (×4): 10 mg via ORAL
  Filled 2017-12-27 (×3): qty 2

## 2017-12-27 MED ORDER — LACTATED RINGERS IV SOLN
INTRAVENOUS | Status: DC
  Administered 2017-12-27: 1000 mL via INTRAVENOUS
  Administered 2017-12-27 (×2): via INTRAVENOUS

## 2017-12-27 MED ORDER — TOLNAFTATE 1 % EX AERP: INHALATION_SPRAY | Freq: Two times a day (BID) | CUTANEOUS | Status: DC | PRN

## 2017-12-27 MED ORDER — TIZANIDINE HCL 4 MG PO TABS
4.0000 mg | ORAL_TABLET | Freq: Three times a day (TID) | ORAL | Status: DC | PRN
  Administered 2017-12-27: 4 mg via ORAL
  Filled 2017-12-27: qty 1

## 2017-12-27 MED ORDER — OXYCODONE HCL 5 MG PO TABS
5.0000 mg | ORAL_TABLET | ORAL | Status: DC | PRN
  Administered 2017-12-27: 10 mg via ORAL
  Filled 2017-12-27 (×2): qty 2

## 2017-12-27 MED ORDER — DEXAMETHASONE SODIUM PHOSPHATE 10 MG/ML IJ SOLN
INTRAMUSCULAR | Status: DC | PRN
  Administered 2017-12-27: 5 mg via INTRAVENOUS

## 2017-12-27 MED ORDER — FENTANYL CITRATE (PF) 100 MCG/2ML IJ SOLN
INTRAMUSCULAR | Status: DC | PRN
  Administered 2017-12-27 (×4): 50 ug via INTRAVENOUS

## 2017-12-27 MED ORDER — ROPIVACAINE HCL 7.5 MG/ML IJ SOLN
INTRAMUSCULAR | Status: DC | PRN
  Administered 2017-12-27: 20 mL via PERINEURAL

## 2017-12-27 MED ORDER — CHLORHEXIDINE GLUCONATE 4 % EX LIQD: 60.0000 mL | Freq: Once | CUTANEOUS | Status: DC

## 2017-12-27 MED ORDER — DOCUSATE SODIUM 100 MG PO CAPS: 200.0000 mg | ORAL_CAPSULE | Freq: Every day | ORAL | Status: DC

## 2017-12-27 MED ORDER — CEFAZOLIN SODIUM-DEXTROSE 2-4 GM/100ML-% IV SOLN
2.0000 g | INTRAVENOUS | Status: AC
  Administered 2017-12-27: 2 g via INTRAVENOUS
  Filled 2017-12-27: qty 100

## 2017-12-27 MED ORDER — DIPHENHYDRAMINE HCL 12.5 MG/5ML PO ELIX: 12.5000 mg | ORAL_SOLUTION | ORAL | Status: DC | PRN

## 2017-12-27 MED ORDER — TRAZODONE HCL 100 MG PO TABS
200.0000 mg | ORAL_TABLET | Freq: Every day | ORAL | Status: DC
  Administered 2017-12-27: 200 mg via ORAL
  Filled 2017-12-27 (×2): qty 2

## 2017-12-27 MED ORDER — PANTOPRAZOLE SODIUM 40 MG PO TBEC
40.0000 mg | DELAYED_RELEASE_TABLET | Freq: Two times a day (BID) | ORAL | Status: DC
  Administered 2017-12-27 – 2017-12-28 (×2): 40 mg via ORAL
  Filled 2017-12-27 (×2): qty 1

## 2017-12-27 MED ORDER — INSULIN ASPART 100 UNIT/ML ~~LOC~~ SOLN
0.0000 [IU] | Freq: Three times a day (TID) | SUBCUTANEOUS | Status: DC
  Administered 2017-12-27: 2 [IU] via SUBCUTANEOUS
  Administered 2017-12-27: 3 [IU] via SUBCUTANEOUS

## 2017-12-27 MED ORDER — KETOROLAC TROMETHAMINE 30 MG/ML IJ SOLN
INTRAMUSCULAR | Status: AC
  Filled 2017-12-27: qty 1

## 2017-12-27 MED ORDER — POLYVINYL ALCOHOL 1.4 % OP SOLN: 1.0000 [drp] | OPHTHALMIC | Status: DC | PRN

## 2017-12-27 MED ORDER — MELOXICAM 15 MG PO TABS
15.0000 mg | ORAL_TABLET | Freq: Every day | ORAL | Status: DC
  Administered 2017-12-28: 15 mg via ORAL
  Filled 2017-12-27: qty 1

## 2017-12-27 MED ORDER — METOCLOPRAMIDE HCL 5 MG PO TABS: 5.0000 mg | ORAL_TABLET | Freq: Three times a day (TID) | ORAL | Status: DC | PRN

## 2017-12-27 MED ORDER — BUPIVACAINE HCL (PF) 0.25 % IJ SOLN
INTRAMUSCULAR | Status: AC
  Filled 2017-12-27: qty 30

## 2017-12-27 MED ORDER — METOCLOPRAMIDE HCL 5 MG/ML IJ SOLN: 5.0000 mg | Freq: Three times a day (TID) | INTRAMUSCULAR | Status: DC | PRN

## 2017-12-27 MED ORDER — MIDAZOLAM HCL 5 MG/5ML IJ SOLN
INTRAMUSCULAR | Status: DC | PRN
  Administered 2017-12-27: 1 mg via INTRAVENOUS

## 2017-12-27 MED ORDER — LEVOTHYROXINE SODIUM 50 MCG PO TABS
175.0000 ug | ORAL_TABLET | Freq: Every day | ORAL | Status: DC
  Administered 2017-12-28: 175 ug via ORAL
  Filled 2017-12-27: qty 1

## 2017-12-27 MED ORDER — FENTANYL CITRATE (PF) 100 MCG/2ML IJ SOLN
INTRAMUSCULAR | Status: AC
  Administered 2017-12-27: 25 ug via INTRAVENOUS
  Filled 2017-12-27: qty 2

## 2017-12-27 MED ORDER — SENNA-DOCUSATE SODIUM 8.6-50 MG PO TABS: 2.0000 | ORAL_TABLET | Freq: Every day | ORAL | 1 refills | Status: DC

## 2017-12-27 MED ORDER — FENTANYL CITRATE (PF) 100 MCG/2ML IJ SOLN
25.0000 ug | INTRAMUSCULAR | Status: DC | PRN
  Administered 2017-12-27 (×3): 25 ug via INTRAVENOUS

## 2017-12-27 MED ORDER — MIDAZOLAM HCL 2 MG/2ML IJ SOLN
INTRAMUSCULAR | Status: AC
  Filled 2017-12-27: qty 2

## 2017-12-27 MED ORDER — ASPIRIN EC 325 MG PO TBEC: 325.0000 mg | DELAYED_RELEASE_TABLET | Freq: Two times a day (BID) | ORAL | 0 refills | Status: DC

## 2017-12-27 MED ORDER — POLYETHYLENE GLYCOL 3350 17 G PO PACK: 17.0000 g | PACK | Freq: Every day | ORAL | Status: DC | PRN

## 2017-12-27 MED ORDER — ASPIRIN EC 325 MG PO TBEC
325.0000 mg | DELAYED_RELEASE_TABLET | Freq: Two times a day (BID) | ORAL | Status: DC
  Administered 2017-12-27 – 2017-12-28 (×2): 325 mg via ORAL
  Filled 2017-12-27 (×2): qty 1

## 2017-12-27 MED ORDER — ALUM & MAG HYDROXIDE-SIMETH 200-200-20 MG/5ML PO SUSP: 30.0000 mL | ORAL | Status: DC | PRN

## 2017-12-27 MED ORDER — CALCIUM CARB-CHOLECALCIFEROL 600-800 MG-UNIT PO TABS: ORAL_TABLET | Freq: Two times a day (BID) | ORAL | Status: DC

## 2017-12-27 MED ORDER — BISACODYL 10 MG RE SUPP: 10.0000 mg | Freq: Every day | RECTAL | Status: DC | PRN

## 2017-12-27 MED ORDER — STERILE WATER FOR IRRIGATION IR SOLN
Status: DC | PRN
  Administered 2017-12-27: 2000 mL

## 2017-12-27 MED ORDER — CALCIUM CARBONATE-VITAMIN D 500-200 MG-UNIT PO TABS
1.0000 | ORAL_TABLET | Freq: Every day | ORAL | Status: DC
  Administered 2017-12-28: 1 via ORAL
  Filled 2017-12-27: qty 1

## 2017-12-27 MED ORDER — MEPERIDINE HCL 50 MG/ML IJ SOLN: 6.2500 mg | INTRAMUSCULAR | Status: DC | PRN

## 2017-12-27 MED ORDER — ONDANSETRON HCL 4 MG/2ML IJ SOLN: 4.0000 mg | Freq: Four times a day (QID) | INTRAMUSCULAR | Status: DC | PRN

## 2017-12-27 MED ORDER — CITALOPRAM HYDROBROMIDE 20 MG PO TABS
40.0000 mg | ORAL_TABLET | Freq: Every day | ORAL | Status: DC
  Administered 2017-12-28: 40 mg via ORAL
  Filled 2017-12-27: qty 2

## 2017-12-27 MED ORDER — OXYCODONE HCL 5 MG PO TABS: 5.0000 mg | ORAL_TABLET | ORAL | 0 refills | Status: DC | PRN

## 2017-12-27 MED ORDER — TRANEXAMIC ACID-NACL 1000-0.7 MG/100ML-% IV SOLN
1000.0000 mg | Freq: Once | INTRAVENOUS | Status: AC
  Administered 2017-12-27: 1000 mg via INTRAVENOUS
  Filled 2017-12-27: qty 100

## 2017-12-27 MED ORDER — DEXAMETHASONE SODIUM PHOSPHATE 10 MG/ML IJ SOLN
INTRAMUSCULAR | Status: AC
  Filled 2017-12-27: qty 1

## 2017-12-27 MED ORDER — ACETAMINOPHEN 500 MG PO TABS
1000.0000 mg | ORAL_TABLET | Freq: Four times a day (QID) | ORAL | Status: AC
  Administered 2017-12-27 – 2017-12-28 (×4): 1000 mg via ORAL
  Filled 2017-12-27 (×4): qty 2

## 2017-12-27 MED ORDER — LORATADINE 10 MG PO TABS: 10.0000 mg | ORAL_TABLET | Freq: Every day | ORAL | Status: DC | PRN

## 2017-12-27 MED ORDER — POTASSIUM CHLORIDE IN NACL 20-0.45 MEQ/L-% IV SOLN
INTRAVENOUS | Status: DC
  Administered 2017-12-27 (×2): via INTRAVENOUS
  Filled 2017-12-27 (×3): qty 1000

## 2017-12-27 MED ORDER — LIDOCAINE HCL (CARDIAC) PF 100 MG/5ML IV SOSY
PREFILLED_SYRINGE | INTRAVENOUS | Status: DC | PRN
  Administered 2017-12-27: 10 mg via INTRAVENOUS
  Administered 2017-12-27: 60 mg via INTRAVENOUS

## 2017-12-27 SURGICAL SUPPLY — 61 items
BAG ZIPLOCK 12X15 (MISCELLANEOUS) ×2 IMPLANT
BANDAGE ELASTIC 6 VELCRO ST LF (GAUZE/BANDAGES/DRESSINGS) ×2 IMPLANT
BANDAGE ESMARK 6X9 LF (GAUZE/BANDAGES/DRESSINGS) ×1 IMPLANT
BEARING TIBIAL OXFORD MED 4 (Orthopedic Implant) ×2 IMPLANT
BLADE SURG 15 STRL LF DISP TIS (BLADE) ×1 IMPLANT
BLADE SURG 15 STRL SS (BLADE) ×1
BNDG ELASTIC 6X10 VLCR STRL LF (GAUZE/BANDAGES/DRESSINGS) ×2 IMPLANT
BNDG ESMARK 6X9 LF (GAUZE/BANDAGES/DRESSINGS) ×2
BOWL SMART MIX CTS (DISPOSABLE) ×2 IMPLANT
CEMENT BONE R 1X40 (Cement) ×2 IMPLANT
COMPONENT TIB MDL OXFRD LT SZA (Joint) ×1 IMPLANT
COVER SURGICAL LIGHT HANDLE (MISCELLANEOUS) ×2 IMPLANT
COVER WAND RF STERILE (DRAPES) IMPLANT
CUFF TOURN SGL QUICK 34 (TOURNIQUET CUFF) ×1
CUFF TRNQT CYL 34X4X40X1 (TOURNIQUET CUFF) ×1 IMPLANT
DECANTER SPIKE VIAL GLASS SM (MISCELLANEOUS) ×2 IMPLANT
DRAPE ORTHO SPLIT 77X108 STRL (DRAPES) ×2
DRAPE POUCH INSTRU U-SHP 10X18 (DRAPES) ×2 IMPLANT
DRAPE SURG ORHT 6 SPLT 77X108 (DRAPES) ×2 IMPLANT
DRAPE U-SHAPE 47X51 STRL (DRAPES) ×2 IMPLANT
DRSG MEPILEX BORDER 4X8 (GAUZE/BANDAGES/DRESSINGS) ×2 IMPLANT
DURAPREP 26ML APPLICATOR (WOUND CARE) ×4 IMPLANT
ELECT REM PT RETURN 15FT ADLT (MISCELLANEOUS) ×2 IMPLANT
FACESHIELD WRAPAROUND (MASK) ×2 IMPLANT
GLOVE BIOGEL PI IND STRL 8 (GLOVE) ×2 IMPLANT
GLOVE BIOGEL PI INDICATOR 8 (GLOVE) ×2
GLOVE ORTHO TXT STRL SZ7.5 (GLOVE) ×2 IMPLANT
GLOVE SURG ORTHO 8.0 STRL STRW (GLOVE) ×2 IMPLANT
GOWN STRL REUS W/TWL 2XL LVL3 (GOWN DISPOSABLE) ×2 IMPLANT
GOWN STRL REUS W/TWL LRG LVL3 (GOWN DISPOSABLE) ×2 IMPLANT
HANDPIECE INTERPULSE COAX TIP (DISPOSABLE) ×1
HOLDER FOLEY CATH W/STRAP (MISCELLANEOUS) IMPLANT
HOOD PEEL AWAY FLYTE STAYCOOL (MISCELLANEOUS) ×4 IMPLANT
IMMOBILIZER KNEE 20 (SOFTGOODS)
IMMOBILIZER KNEE 20 THIGH 36 (SOFTGOODS) IMPLANT
IMMOBILIZER KNEE 22 UNIV (SOFTGOODS) ×2 IMPLANT
KIT BASIN OR (CUSTOM PROCEDURE TRAY) ×2 IMPLANT
NDL SAFETY ECLIPSE 18X1.5 (NEEDLE) ×1 IMPLANT
NEEDLE HYPO 18GX1.5 SHARP (NEEDLE) ×1
NS IRRIG 1000ML POUR BTL (IV SOLUTION) ×2 IMPLANT
PACK BLADE SAW RECIP 70 3 PT (BLADE) ×2 IMPLANT
PACK ICE MAXI GEL EZY WRAP (MISCELLANEOUS) ×2 IMPLANT
PACK TOTAL JOINT (CUSTOM PROCEDURE TRAY) ×2 IMPLANT
PEG TWIN FEM CEMENTED MED (Knees) ×2 IMPLANT
POSITIONER SURGICAL ARM (MISCELLANEOUS) ×2 IMPLANT
SET HNDPC FAN SPRY TIP SCT (DISPOSABLE) ×1 IMPLANT
STRIP CLOSURE SKIN 1/2X4 (GAUZE/BANDAGES/DRESSINGS) ×2 IMPLANT
SUCTION FRAZIER HANDLE 12FR (TUBING) ×1
SUCTION TUBE FRAZIER 12FR DISP (TUBING) ×1 IMPLANT
SUT VIC AB 0 CT1 36 (SUTURE) ×4 IMPLANT
SUT VIC AB 2-0 CT1 27 (SUTURE) ×1
SUT VIC AB 2-0 CT1 TAPERPNT 27 (SUTURE) ×1 IMPLANT
SUT VIC AB 3-0 SH 8-18 (SUTURE) ×2 IMPLANT
SYR 20CC LL (SYRINGE) ×2 IMPLANT
SYR 3ML LL SCALE MARK (SYRINGE) ×2 IMPLANT
TIBIA MEDIAL OXFORD LEFT SZ A (Joint) ×2 IMPLANT
TOWEL OR 17X26 10 PK STRL BLUE (TOWEL DISPOSABLE) ×6 IMPLANT
TOWEL OR NON WOVEN STRL DISP B (DISPOSABLE) ×2 IMPLANT
TRAY FOLEY MTR SLVR 16FR STAT (SET/KITS/TRAYS/PACK) ×2 IMPLANT
WATER STERILE IRR 1000ML POUR (IV SOLUTION) ×2 IMPLANT
WRAP KNEE MAXI GEL POST OP (GAUZE/BANDAGES/DRESSINGS) ×2 IMPLANT

## 2017-12-27 NOTE — Anesthesia Procedure Notes (Signed)
Procedure Name: LMA Insertion Date/Time: 12/27/2017 7:33 AM Performed by: Thornell Mule, CRNA Pre-anesthesia Checklist: Patient identified, Emergency Drugs available, Suction available and Patient being monitored Patient Re-evaluated:Patient Re-evaluated prior to induction Oxygen Delivery Method: Circle system utilized Preoxygenation: Pre-oxygenation with 100% oxygen Induction Type: IV induction Ventilation: Mask ventilation without difficulty LMA Size: 4.0 Number of attempts: 1 Placement Confirmation: positive ETCO2 Tube secured with: Tape Dental Injury: Teeth and Oropharynx as per pre-operative assessment

## 2017-12-27 NOTE — Discharge Instructions (Signed)

## 2017-12-27 NOTE — Op Note (Signed)
12/27/2017  9:16 AM  PATIENT:  Katie Booker    PRE-OPERATIVE DIAGNOSIS: Left knee anteromedial osteoarthritis  POST-OPERATIVE DIAGNOSIS:  Same  PROCEDURE: LEFT unicompartmental Knee Arthroplasty  SURGEON:  Eulas Post, MD  PHYSICIAN ASSISTANT: Janace Litten, OPA-C, present and scrubbed throughout the case, critical for completion in a timely fashion, and for retraction, instrumentation, and closure.  ANESTHESIA:   General with adductor canal block and intra-articular injection of Marcaine and Toradol  ESTIMATED BLOOD LOSS: 75 mL  UNIQUE ASPECTS OF THE CASE: She had significant eburnation of both the tibia and femur on the medial side.  The lateral side was completely intact.  There was a little bit of mucoid degeneration of the ACL, but it was structurally still intact.  The undersurface of the patella was intact laterally, she did have some grade 2 changes medially on the medial facet, the femoral trochlea had some grade 1 and grade 2 changes as well, but the real disease was medial.  PREOPERATIVE INDICATIONS:  Katie Booker is a  72 y.o. female with a diagnosis of djd left knee who failed conservative measures and elected for surgical management.    The risks benefits and alternatives were discussed with the patient preoperatively including but not limited to the risks of infection, bleeding, nerve injury, cardiopulmonary complications, blood clots, the need for revision surgery, among others, and the patient was willing to proceed.  OPERATIVE IMPLANTS: Biomet [employer] mobile bearing medial compartment arthroplasty femur size medium, tibia size A, bearing size 4.  OPERATIVE FINDINGS: Endstage grade 4 medial compartment osteoarthritis.   OPERATIVE PROCEDURE: The patient was brought to the operating room placed in the supine position. General anesthesia was administered. IV antibiotics were given. The lower extremity was placed in the legholder and prepped and draped in usual  sterile fashion.  Time out was performed.  The leg was elevated and exsanguinated and the tourniquet was inflated. Anteromedial incision was performed, and I took care to preserve the MCL. Parapatellar incision was carried out, and the osteophytes were excised, along with the medial meniscus and a small portion of the fat pad.  The extra medullary tibial cutting jig was applied, using the spoon and the 4mm G-Clamp and the 2 mm shim, and I took care to protect the anterior cruciate ligament insertion and the tibial spine. The medial collateral ligament was also protected, and I resected my proximal tibia, matching the anatomic slope.   The proximal tibial bony cut was removed in one piece, and I turned my attention to the femur.  There was a small cyst in the very posterior lateral aspect of the medial tibia, but it did not compromise the posterior wall, or ultimately compromise the keel cut.  The intramedullary femoral rod was placed using the drill, and then using the appropriate reference, I assembled the femoral jig, setting my posterior cutting block. I resected my posterior femur, used the 0 spigot for the anterior femur, and then measured my gap.   I then used the appropriate mill to match the extension gap to the flexion gap. The second milling was at a 2.  The gaps were then measured again with the appropriate feeler gauges. Once I had balanced flexion and extension gaps, I then completed the preparation of the femur.  I milled off the anterior aspect of the distal femur to prevent impingement. I also exposed the tibia, and selected the above-named component, and then used the cutting jig to prepare the keel slot on the  tibia. I also used the awl to curette out the bone to complete the preparation of the keel. The back wall was intact.  I then placed trial components, and it was found to have excellent motion, and appropriate balance.  I then cemented the components into place, cementing  the tibia first, removing all excess cement, and then cementing the femur.  All loose cement was removed.  The real polyethylene insert was applied manually, and the knee was taken through functional range of motion, and found to have excellent stability and restoration of joint motion, with excellent balance.  The wounds were irrigated copiously, and the parapatellar tissue closed with Vicryl, followed by Vicryl for the subcutaneous tissue, with routine closure with Steri-Strips and sterile gauze.  The tourniquet was released, and the patient was awakened and extubated and returned to PACU in stable and satisfactory condition. There were no complications.

## 2017-12-27 NOTE — H&P (Signed)
PREOPERATIVE H&P  Chief Complaint: left knee pain  HPI: Katie Booker is a 72 y.o. female who presents for preoperative history and physical with a diagnosis of left knee pain/arthritis. Symptoms are rated as moderate to severe, and have been worsening.  This is significantly impairing activities of daily living.  She has elected for surgical management.   She has failed injections, activity modification, anti-inflammatories, and assistive devices.  Preoperative X-rays demonstrate end stage degenerative changes with osteophyte formation, loss of joint space, subchondral sclerosis.   Past Medical History:  Diagnosis Date  . Age-related macular degeneration, wet, right eye (HCC)    per pt has had treatment in past  . Anxiety   . Chronic constipation   . Chronic low back pain   . Chronic neck pain   . 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 Depression   . DOE (dyspnea on exertion)   . GERD   . Headache   . Hemorrhoids   . History of basal cell carcinoma excision    left cheek  . History of colon polyps   . History of hyperthyroidism 2011   RAI treatement  . History of panic attacks   . Hyperlipidemia   . Hypertension   . Hypothyroidism, postradioiodine therapy    followed by pcp  . IDA (iron deficiency anemia)    intermittant  . Insomnia   . OA (osteoarthritis)   . OSA (obstructive sleep apnea)    no cpap, per pt tried unable to tolerate  . Peripheral neuropathy    legs and feet from hx back surgery's  . Rash    bilateral axilla area -- per pt due to some personal wipes used other the counter  . RLS (restless legs syndrome)   . Spondylolisthesis at L1-L2 level   . Tingling of both upper extremities    left greater than right due to cervical pinched nerve  . Type 2 diabetes mellitus (HCC)    followed by pcp  . Umbilical hernia   . Urinary frequency   . Urinary urgency    Past Surgical History:  Procedure  Laterality Date  . ANTERIOR CERVICAL DECOMP/DISCECTOMY FUSION N/A 09/02/2014   Procedure: Cervical five- six  Anterior cervical decompression fusion;  Surgeon: Barnett Abu, MD;  Location: [facility];  Service: Neurosurgery;  Laterality: N/A;  C5-6 Anterior cervical decompression/diskectomy/fusion  . CARPAL TUNNEL RELEASE Right 2016  . CATARACT EXTRACTION W/ INTRAOCULAR LENS  IMPLANT, BILATERAL  03/2015  . COLONOSCOPY  2007  . ESOPHAGOGASTRODUODENOSCOPY  2007  . HEMORRHOID SURGERY  03/23/2011   Procedure: HEMORRHOIDECTOMY;  Surgeon: Clovis Pu. Cornett, MD;  Location: [facility];  Service: General;  Laterality: N/A;  lateral internal sphincterotomy and hemorrhoidectomy  . LUMBAR FUSION  02/2015   L1 -- 2  . THORACIC DISCECTOMY N/A 12/15/2012   Procedure: Thoracic ten-eleven Thoracic laminectomy;  Surgeon: Barnett Abu, MD;  Location: [facility];  Service: Neurosurgery;  Laterality: N/A;  Thoracic ten-eleven Thoracic laminectomy  . TUBAL LIGATION Bilateral 1991   Social History   Socioeconomic History  . Marital status: Single    Spouse name: Not on file  . Number of children: Not on file  . Years of education: Not on file  . Highest education level: Not on file  Occupational History  . Occupation: retired  Engineer, production  . Financial resource strain: Not on file  . Food insecurity:  Worry: Not on file    Inability: Not on file  . Transportation needs:    Medical: Not on file    Non-medical: Not on file  Tobacco Use  . Smoking status: Former Smoker    Packs/day: 1.00    Years: 20.00    Pack years: 20.00    Types: Cigarettes    Last attempt to quit: 02/23/2004    Years since quitting: 13.8  . Smokeless tobacco: Never Used  . Tobacco comment: quit 2007  Substance and Sexual Activity  . Alcohol use: Yes    Alcohol/week: 0.0 standard drinks    Comment: rare  . Drug use: No  . Sexual activity: Never    Birth control/protection: Post-menopausal  Lifestyle  .  Physical activity:    Days per week: Not on file    Minutes per session: Not on file  . Stress: Not on file  Relationships  . Social connections:    Talks on phone: Not on file    Gets together: Not on file    Attends religious service: Not on file    Active member of club or organization: Not on file    Attends meetings of clubs or organizations: Not on file    Relationship status: Not on file  Other Topics Concern  . Not on file  Social History Narrative  . Not on file   Family History  Problem Relation Age of Onset  . Diabetes Father   . COPD Father   . Cancer Maternal Grandfather        stomach   Allergies  Allergen Reactions  . Duraprep [employer], Misc.] Itching and Rash    Irritation everywhere prep was used  . Betadine [Povidone Iodine] Other (See Comments)    Burning sensation.   . Hydromorphone Hcl Other (See Comments)    Makes crazy  . Morphine And Related Other (See Comments)    hallucinations   Prior to Admission medications   Medication Sig Start Date End Date Taking? Authorizing Provider  ALPRAZolam (XANAX) 0.5 MG tablet TAKE ONE TABLET BY MOUTH TWICE A DAY AS NEEDED FOR ANXIETY Patient taking differently: Take 0.5 mg by mouth 2 (two) times daily as needed for anxiety. TAKE ONE TABLET BY MOUTH TWICE A DAY AS NEEDED FOR ANXIETY 11/30/17  Yes Roderick Pee, MD  atenolol (TENORMIN) 25 MG tablet TAKE 1 TABLET BY MOUTH  DAILY Patient taking differently: Take 25 mg by mouth every morning.  09/07/17  Yes Gordy Savers, MD  atorvastatin (LIPITOR) 40 MG tablet Take 1 tablet (40 mg total) by mouth daily. Patient taking differently: Take 40 mg by mouth at bedtime.  10/31/17  Yes Gordy Savers, MD  Calcium Carb-Cholecalciferol (CALCIUM 600/VITAMIN D3 PO) Take 1 tablet by mouth 2 (two) times daily.   Yes [provider]  Carboxymethylcellulose Sodium (LUBRICANT EYE DROPS OP) Apply 2 drops to eye 4 (four) times daily as needed (dry eyes).     Yes [provider]  citalopram (CELEXA) 40 MG tablet TAKE 1 TABLET BY MOUTH  DAILY Patient taking differently: Take 40 mg by mouth every morning.  06/28/17  Yes Gordy Savers, MD  docusate sodium (COLACE) 100 MG capsule Take 200 mg by mouth at bedtime.    Yes [provider]  gemfibrozil (LOPID) 600 MG tablet TAKE 1 TABLET BY MOUTH TWO  TIMES DAILY BEFORE MEALS Patient taking differently: Take 600 mg by mouth 2 (two) times daily before a meal.  06/28/17  Yes Gordy Savers, MD  HYDROcodone-acetaminophen (NORCO) 7.5-325 MG tablet Take 1 tablet by mouth every 6 (six) hours as needed for moderate pain. 11/30/17  Yes Roderick Pee, MD  hydrocortisone 2.5 % cream APPLY 1 APPLICATION  TOPICALLY 2  TIMES DAILY. Patient taking differently: Apply 1 application topically 2 (two) times daily as needed (irritation). APPLY 1 APPLICATION  TOPICALLY 2  TIMES DAILY. 10/31/17  Yes Gordy Savers, MD  levothyroxine (SYNTHROID, LEVOTHROID) 175 MCG tablet TAKE 1 TABLET BY MOUTH  DAILY BEFORE BREAKFAST Patient taking differently: Take 175 mcg by mouth daily before breakfast.  05/26/17  Yes Gordy Savers, MD  lisinopril (PRINIVIL,ZESTRIL) 10 MG tablet TAKE 1 TABLET BY MOUTH ONCE DAILY Patient taking differently: Take 10 mg by mouth every morning.  09/07/17  Yes Gordy Savers, MD  loratadine (CLARITIN) 10 MG tablet Take 10 mg by mouth daily as needed for allergies.   Yes [provider]  meloxicam (MOBIC) 15 MG tablet TAKE 1 TABLET BY MOUTH  DAILY 09/07/17  Yes Gordy Savers, MD  Multiple Vitamin (MULTIVITAMIN) tablet Take 1 tablet by mouth daily.   Yes [provider]  Omega-3 Fatty Acids (FISH OIL) 1200 MG CAPS Take 1 capsule by mouth daily.    Yes [provider]  pantoprazole (PROTONIX) 40 MG tablet Take 1 tablet (40 mg total) by mouth 2 (two) times daily before a meal. 08/29/17  Yes Gordy Savers, MD  pioglitazone (ACTOS) 15 MG tablet  TAKE 1 TABLET BY MOUTH  DAILY Patient taking differently: Take 15 mg by mouth every morning.  09/07/17  Yes Gordy Savers, MD  pregabalin (LYRICA) 75 MG capsule Take 1 capsule (75 mg total) by mouth daily. Patient taking differently: Take 75 mg by mouth at bedtime.  11/03/17  Yes Gordy Savers, MD  rOPINIRole (REQUIP) 1 MG tablet Take 1 tablet (1 mg total) by mouth at bedtime. 11/30/17  Yes Roderick Pee, MD  tiZANidine (ZANAFLEX) 4 MG capsule Take 1 capsule (4 mg total) by mouth 3 (three) times daily. Patient taking differently: Take 4 mg by mouth 3 (three) times daily as needed for muscle spasms.  03/03/17  Yes Gordy Savers, MD  traZODone (DESYREL) 100 MG tablet TAKE 2 TABLETS BY MOUTH AT  BEDTIME 11/22/17  Yes Roderick Pee, MD  estradiol (ESTRACE) 0.1 MG/GM vaginal cream Place 1 Applicatorful vaginally 3 (three) times a week. 10/07/14   Gordy Savers, MD  Lancets [facility] ULTRASOFT) lancets Use daily 11/12/11   Gordy Savers, MD  metFORMIN (GLUCOPHAGE) 1000 MG tablet Take 1 tablet (1,000 mg total) by mouth 2 (two) times daily with a meal. 12/26/17   Roderick Pee, MD  ONE [facility] ULTRA TEST test strip USE 1 DAILY AS DIRECTED 05/12/17   Gordy Savers, MD  tolnaftate (TINACTIN) 1 % spray Apply topically 2 (two) times daily as needed.    [provider]     Positive ROS: All other systems have been reviewed and were otherwise negative with the exception of those mentioned in the HPI and as above.  Physical Exam: General: Alert, no acute distress Cardiovascular: No pedal edema Respiratory: No cyanosis, no use of accessory musculature GI: No organomegaly, abdomen is soft and non-tender Skin: No lesions in the area of chief complaint Neurologic: Sensation intact distally Psychiatric: Patient is competent for consent with normal mood and affect Lymphatic: No axillary or cervical lymphadenopathy  MUSCULOSKELETAL: left knee with  varus and  crepitance and painful 0-120 arc of motion  Assessment: Left knee osteoarthritis   Plan: Plan for Procedure(s): UNICOMPARTMENTAL LEFT KNEE  The risks benefits and alternatives were discussed with the patient including but not limited to the risks of nonoperative treatment, versus surgical intervention including infection, bleeding, nerve injury,  blood clots, cardiopulmonary complications, morbidity, mortality, among others, and they were willing to proceed.    Patient's anticipated LOS is less than 2 midnights, meeting these requirements: - Younger than 74 - Lives within 1 hour of care - Has a competent adult at home to recover with post-op recover - NO history of  - Chronic pain requiring opiods  - Diabetes  - Coronary Artery Disease  - Heart failure  - Heart attack  - Stroke  - DVT/VTE  - Cardiac arrhythmia  - Respiratory Failure/COPD  - Renal failure  - Anemia  - Advanced Liver disease        Eulas Post, MD Cell [phone]   12/27/2017 7:19 AM

## 2017-12-27 NOTE — Anesthesia Procedure Notes (Signed)
Anesthesia Regional Block: Adductor canal block   Pre-Anesthetic Checklist: ,, timeout performed, Correct Patient, Correct Site, Correct Laterality, Correct Procedure, Correct Position, site marked, Risks and benefits discussed,  Surgical consent,  Pre-op evaluation,  At surgeon's request and post-op pain management  Laterality: Left  Prep: chloraprep       Needles:  Injection technique: Single-shot  Needle Type: Echogenic Stimulator Needle     Needle Length: 9cm  Needle Gauge: 21   Needle insertion depth: 6 cm   Additional Needles:   Procedures:,,,, ultrasound used (permanent image in chart),,,,  Narrative:  Start time: 12/27/2017 7:07 AM End time: 12/27/2017 7:12 AM Injection made incrementally with aspirations every 5 mL.  Performed by: Personally  Anesthesiologist: Mal Amabile, MD  Additional Notes: Timeout performed. Patient sedated. Relevant anatomy ID'd using [place]. Incremental 2-30ml injection of LA with frequent aspiration. Patient tolerated procedure well.        Left Adductor Canal Block

## 2017-12-27 NOTE — Anesthesia Procedure Notes (Signed)
Date/Time: 12/27/2017 7:02 AM Performed by: Thornell Mule, CRNA Oxygen Delivery Method: Nasal cannula

## 2017-12-27 NOTE — Anesthesia Postprocedure Evaluation (Signed)
Anesthesia Post Note  Patient: Katie Booker  Procedure(s) Performed: UNICOMPARTMENTAL LEFT KNEE (Left Knee)     Patient location during evaluation: PACU Anesthesia Type: General Level of consciousness: awake and alert and oriented Pain management: pain level controlled Vital Signs Assessment: post-procedure vital signs reviewed and stable Respiratory status: spontaneous breathing, nonlabored ventilation, respiratory function stable and patient connected to nasal cannula oxygen Cardiovascular status: blood pressure returned to baseline and stable Postop Assessment: no apparent nausea or vomiting Anesthetic complications: no    Last Vitals:  Vitals:   12/27/17 1030 12/27/17 1045  BP: 133/74 122/71  Pulse: 94 91  Resp: 17 12  Temp:  37 C  SpO2: 96% 96%    Last Pain:  Vitals:   12/27/17 1045  TempSrc:   PainSc: 5                  Zyair Russi A.

## 2017-12-27 NOTE — Transfer of Care (Signed)
Immediate Anesthesia Transfer of Care Note  Patient: Katie Booker  Procedure(s) Performed: UNICOMPARTMENTAL LEFT KNEE (Left Knee)  Patient Location: PACU  Anesthesia Type:General  Level of Consciousness: drowsy, patient cooperative and responds to stimulation  Airway & Oxygen Therapy: Patient Spontanous Breathing and Patient connected to face mask oxygen  Post-op Assessment: Report given to RN and Post -op Vital signs reviewed and stable  Post vital signs: Reviewed and stable  Last Vitals:  Vitals Value Taken Time  BP 122/62 12/27/2017  9:39 AM  Temp    Pulse 83 12/27/2017  9:40 AM  Resp 14 12/27/2017  9:40 AM  SpO2 100 % 12/27/2017  9:40 AM  Vitals shown include unvalidated device data.  Last Pain:  Vitals:   12/27/17 0617  TempSrc:   PainSc: 2       Patients Stated Pain Goal: 4 (12/27/17 0617)  Complications: No apparent anesthesia complications

## 2017-12-27 NOTE — Evaluation (Signed)
Physical Therapy Evaluation Patient Details Name: Katie Booker MRN: [id number] DOB: [birth year] Today's Date: 12/27/2017   History of Present Illness  72 yo female s/p L UKR on 12/27/17. PMH includes macular degeneration, anxiety, LBP, neck pain with thoracic discectomy and cervical fusion 2014 and 2016 respectively, DOE, HTN, HLD, peripheral neuropathy, spondylolisthesis L1 L2 with lumbar fusion 2017, DMII.  Clinical Impression   Pt presents with mild L knee pain, difficulty performing mobility tasks, decreased L knee ROM, and decreased tolerance for ambulation. Pt to benefit from acute PT to address deficits. Pt ambulated 75 ft with RW with min guard assist. Pt educated on quad sets (5-10/hour), ankle pumps (20/hour), and heel slides (5-10/hour) to perform this afternoon/evening to lessen stiffness and increase circulation, to pt's tolerance and limited by pain. PT to progress mobility as tolerated, and will continue to follow acutely.   Pt lives alone, and a PT concern is safety for d/c home. Will continue to assess pt and her mobility needs.      Follow Up Recommendations Follow surgeon's recommendation for DC plan and follow-up therapies;Supervision for mobility/OOB    Equipment Recommendations  None recommended by PT    Recommendations for Other Services       Precautions / Restrictions Precautions Precautions: Fall Restrictions Weight Bearing Restrictions: No Other Position/Activity Restrictions: WBAT      Mobility  Bed Mobility Overal bed mobility: Needs Assistance             General bed mobility comments: Pt on toilet with 2 nursing students upon PT arrival to room. Pt instructed to sit EOB to don nonslip socks, gait belt, and backgown.   Transfers Overall transfer level: Needs assistance Equipment used: Rolling walker (2 wheeled) Transfers: Sit to/from Stand Sit to Stand: Min assist;From elevated surface         General transfer comment: Min assist for  power up and steadying upon standing. without warning, pt quickly sat down on EOB. When asked what happened, pt states "I'm not very good at standing and walking". Upon second attempt, pt with increased steadiness. Verbal cuing for hand placement.   Ambulation/Gait Ambulation/Gait assistance: Min guard Gait Distance (Feet): 75 Feet Assistive device: Rolling walker (2 wheeled) Gait Pattern/deviations: Step-to pattern;Decreased stride length;Decreased weight shift to left Gait velocity: decr    General Gait Details: Min guard for safety. Verbal cuing for placement in RW, sequencing, turning.  Stairs            Wheelchair Mobility    Modified Rankin (Stroke Patients Only)       Balance Overall balance assessment: Needs assistance Sitting-balance support: No upper extremity supported Sitting balance-Leahy Scale: Good     Standing balance support: Bilateral upper extremity supported Standing balance-Leahy Scale: Poor Standing balance comment: relies on RW for support                              Pertinent Vitals/Pain Pain Assessment: 0-10 Pain Score: 2  Pain Location: L knee  Pain Descriptors / Indicators: Sore;Aching Pain Intervention(s): Limited activity within patient's tolerance;Ice applied;Repositioned;Monitored during session    Home Living Family/patient expects to be discharged to:: Private residence Living Arrangements: Alone Available Help at Discharge: Friend(s);Neighbor;Available PRN/intermittently Type of Home: Other(Comment)(condo) Home Access: Level entry     Home Layout: One level Home Equipment: Walker - 4 wheels;Shower seat - built in;Walker - 2 wheels;Cane - single point      Prior Function Level  of Independence: Independent with assistive device(s)         Comments: Pt reports using rollator for all mobility PTA. Pt endorses using shopping cart for steadying in grocery store. Pt also reports cleaning is difficult for her, and  will have assist with cleaning upon return home.      Hand Dominance   Dominant Hand: Right    Extremity/Trunk Assessment   Upper Extremity Assessment Upper Extremity Assessment: Overall WFL for tasks assessed    Lower Extremity Assessment Lower Extremity Assessment: Generalized weakness;LLE deficits/detail LLE Deficits / Details: Suspected post-surgical LLE weakness; able to perform ankle pumps, quad set, heel slide  LLE Sensation: history of peripheral neuropathy    Cervical / Trunk Assessment Cervical / Trunk Assessment: Normal  Communication   Communication: No difficulties  Cognition Arousal/Alertness: Awake/alert Behavior During Therapy: WFL for tasks assessed/performed Overall Cognitive Status: Within Functional Limits for tasks assessed                                 General Comments: Pt with some STM deficits evident during eval, but suspected due to pain medication/anesthesia.       General Comments      Exercises Total Joint Exercises Ankle Circles/Pumps: AROM;Both;10 reps;Seated Quad Sets: AROM;Left;5 reps;Seated Heel Slides: AROM;Left;5 reps;Seated Goniometric ROM: knee AAROM ~5-90*, limited by pain    Assessment/Plan    PT Assessment Patient needs continued PT services  PT Problem List Decreased strength;Pain;Decreased range of motion;Decreased activity tolerance;Decreased knowledge of use of DME;Decreased safety awareness;Decreased balance;Decreased mobility       PT Treatment Interventions DME instruction;Therapeutic activities;Gait training;Therapeutic exercise;Balance training;Patient/family education;Functional mobility training    PT Goals (Current goals can be found in the Care Plan section)  Acute Rehab PT Goals Patient Stated Goal: none stated  PT Goal Formulation: With patient Time For Goal Achievement: 01/03/18 Potential to Achieve Goals: Good    Frequency 7X/week   Barriers to discharge        Co-evaluation                AM-PAC PT "6 Clicks" Daily Activity  Outcome Measure Difficulty turning over in bed (including adjusting bedclothes, sheets and blankets)?: Unable Difficulty moving from lying on back to sitting on the side of the bed? : Unable Difficulty sitting down on and standing up from a chair with arms (e.g., wheelchair, bedside commode, etc,.)?: Unable Help needed moving to and from a bed to chair (including a wheelchair)?: A Little Help needed walking in hospital room?: A Little Help needed climbing 3-5 steps with a railing? : A Little 6 Click Score: 12    End of Session Equipment Utilized During Treatment: Gait belt Activity Tolerance: Patient tolerated treatment well Patient left: in chair;with chair alarm set;with call bell/phone within reach;with SCD's reapplied Nurse Communication: Mobility status PT Visit Diagnosis: Unsteadiness on feet (R26.81);Difficulty in walking, not elsewhere classified (R26.2)    Time: 1610-9604 PT Time Calculation (min) (ACUTE ONLY): 29 min   Charges:   PT Evaluation $PT Eval Low Complexity: 1 Low PT Treatments $Gait Training: 8-22 mins       Nicola Police, PT Acute Rehabilitation Services Pager [phone]  Office [phone]  Lashandra Arauz D Anissa Abbs 12/27/2017, 5:06 PM

## 2017-12-28 DIAGNOSIS — K5909 Other constipation: Secondary | ICD-10-CM | POA: Diagnosis not present

## 2017-12-28 DIAGNOSIS — K219 Gastro-esophageal reflux disease without esophagitis: Secondary | ICD-10-CM | POA: Diagnosis not present

## 2017-12-28 DIAGNOSIS — G4700 Insomnia, unspecified: Secondary | ICD-10-CM | POA: Diagnosis not present

## 2017-12-28 DIAGNOSIS — E785 Hyperlipidemia, unspecified: Secondary | ICD-10-CM | POA: Diagnosis not present

## 2017-12-28 DIAGNOSIS — E1140 Type 2 diabetes mellitus with diabetic neuropathy, unspecified: Secondary | ICD-10-CM | POA: Diagnosis not present

## 2017-12-28 DIAGNOSIS — E890 Postprocedural hypothyroidism: Secondary | ICD-10-CM | POA: Diagnosis not present

## 2017-12-28 DIAGNOSIS — Z791 Long term (current) use of non-steroidal anti-inflammatories (NSAID): Secondary | ICD-10-CM | POA: Diagnosis not present

## 2017-12-28 DIAGNOSIS — G4733 Obstructive sleep apnea (adult) (pediatric): Secondary | ICD-10-CM | POA: Diagnosis not present

## 2017-12-28 DIAGNOSIS — G2581 Restless legs syndrome: Secondary | ICD-10-CM | POA: Diagnosis not present

## 2017-12-28 DIAGNOSIS — I10 Essential (primary) hypertension: Secondary | ICD-10-CM | POA: Diagnosis not present

## 2017-12-28 DIAGNOSIS — Z87891 Personal history of nicotine dependence: Secondary | ICD-10-CM | POA: Diagnosis not present

## 2017-12-28 DIAGNOSIS — Z85828 Personal history of other malignant neoplasm of skin: Secondary | ICD-10-CM | POA: Diagnosis not present

## 2017-12-28 DIAGNOSIS — Z79899 Other long term (current) drug therapy: Secondary | ICD-10-CM | POA: Diagnosis not present

## 2017-12-28 DIAGNOSIS — Z7984 Long term (current) use of oral hypoglycemic drugs: Secondary | ICD-10-CM | POA: Diagnosis not present

## 2017-12-28 DIAGNOSIS — M1712 Unilateral primary osteoarthritis, left knee: Secondary | ICD-10-CM | POA: Diagnosis not present

## 2017-12-28 DIAGNOSIS — Z885 Allergy status to narcotic agent status: Secondary | ICD-10-CM | POA: Diagnosis not present

## 2017-12-28 LAB — CBC
HCT: 33.7 % — ABNORMAL LOW (ref 36.0–46.0)
HEMOGLOBIN: 10.6 g/dL — AB (ref 12.0–15.0)
MCH: 27.5 pg (ref 26.0–34.0)
MCHC: 31.5 g/dL (ref 30.0–36.0)
MCV: 87.5 fL (ref 80.0–100.0)
Platelets: 151 10*3/uL (ref 150–400)
RBC: 3.85 MIL/uL — ABNORMAL LOW (ref 3.87–5.11)
RDW: 12.9 % (ref 11.5–15.5)
WBC: 7.3 10*3/uL (ref 4.0–10.5)
nRBC: 0 % (ref 0.0–0.2)

## 2017-12-28 LAB — BASIC METABOLIC PANEL
Anion gap: 8 (ref 5–15)
BUN: 22 mg/dL (ref 8–23)
CALCIUM: 8.4 mg/dL — AB (ref 8.9–10.3)
CHLORIDE: 104 mmol/L (ref 98–111)
CO2: 27 mmol/L (ref 22–32)
CREATININE: 0.78 mg/dL (ref 0.44–1.00)
GFR calc non Af Amer: >60 mL/min (ref >60)
Glucose, Bld: 121 mg/dL — ABNORMAL HIGH (ref 70–99)
Potassium: 4.1 mmol/L (ref 3.5–5.1)
SODIUM: 139 mmol/L (ref 135–145)

## 2017-12-28 LAB — GLUCOSE, CAPILLARY
GLUCOSE-CAPILLARY: 83 mg/dL (ref 70–99)
GLUCOSE-CAPILLARY: 106 mg/dL — AB (ref 70–99)

## 2017-12-28 NOTE — Care Management Note (Signed)
Case Management Note  Patient Details  Name: Katie Booker MRN: [id number] Date of Birth: 1945-06-08  Subjective/Objective:    Spoke with patient at bedside. She is unsure waht her d/c plan is. She cannot afford a copay for PT. She recently went to OP and incurred a copay.               Action/Plan: She will discuss with attending her limitations. Has RW and 3n1. [phone]    Expected Discharge Date:                  Expected Discharge Plan:  Home/Self Care  In-House Referral:  NA  Discharge planning Services  CM Consult  Post Acute Care Choice:  NA Choice offered to:  Patient  DME Arranged:  N/A DME Agency:  NA  HH Arranged:  NA HH Agency:  NA  Status of Service:  Completed, signed off  If discussed at Long Length of Stay Meetings, dates discussed:    Additional Comments:  Alexis Goodell, RN 12/28/2017, 9:32 AM

## 2017-12-28 NOTE — Care Management Obs Status (Signed)
MEDICARE OBSERVATION STATUS NOTIFICATION   Patient Details  Name: Katie Booker MRN: [id number] Date of Birth: 09/21/1945   Medicare Observation Status Notification Given:  Yes    Alexis Goodell, RN 12/28/2017, 9:26 AM

## 2017-12-28 NOTE — Discharge Summary (Signed)
Physician Discharge Summary  Patient ID: Katie Booker MRN: [id number] DOB/AGE: 05-12-1945 72 y.o.  Admit date: 12/27/2017 Discharge date: 12/28/2017  Admission Diagnoses:  Primary localized osteoarthritis of left knee  Discharge Diagnoses:  Principal Problem:   Primary localized osteoarthritis of left knee Active Problems:   OSA (obstructive sleep apnea)   Exogenous obesity   S/P knee replacement   Past Medical History:  Diagnosis Date  . Age-related macular degeneration, wet, right eye (HCC)    per pt has had treatment in past  . Anxiety   . Chronic constipation   . Chronic low back pain   . Chronic neck pain   . 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 Depression   . DOE (dyspnea on exertion)   . GERD   . Headache   . Hemorrhoids   . History of basal cell carcinoma excision    left cheek  . History of colon polyps   . History of hyperthyroidism 2011   RAI treatement  . History of panic attacks   . Hyperlipidemia   . Hypertension   . Hypothyroidism, postradioiodine therapy    followed by pcp  . IDA (iron deficiency anemia)    intermittant  . Insomnia   . OA (osteoarthritis)   . OSA (obstructive sleep apnea)    no cpap, per pt tried unable to tolerate  . Peripheral neuropathy    legs and feet from hx back surgery's  . Rash    bilateral axilla area -- per pt due to some personal wipes used other the counter  . RLS (restless legs syndrome)   . Spondylolisthesis at L1-L2 level   . Tingling of both upper extremities    left greater than right due to cervical pinched nerve  . Type 2 diabetes mellitus (HCC)    followed by pcp  . Umbilical hernia   . Urinary frequency   . Urinary urgency     Surgeries: Procedure(s): UNICOMPARTMENTAL LEFT KNEE on 12/27/2017   Consultants (if any):   Discharged Condition: Improved  Hospital Course: Katie Booker is an 72 y.o. female who was admitted 12/27/2017 with a  diagnosis of Primary localized osteoarthritis of left knee and went to the operating room on 12/27/2017 and underwent the above named procedures.    She was given perioperative antibiotics:  Anti-infectives (From admission, onward)   Start     Dose/Rate Route Frequency Ordered Stop   12/27/17 1400  ceFAZolin (ANCEF) IVPB 2g/100 mL premix     2 g 200 mL/hr over 30 Minutes Intravenous Every 6 hours 12/27/17 1112 12/27/17 2042   12/27/17 0600  ceFAZolin (ANCEF) IVPB 2g/100 mL premix     2 g 200 mL/hr over 30 Minutes Intravenous On call to O.R. 12/27/17 4098 12/27/17 0804    .  She was given sequential compression devices, early ambulation, and aspirin for DVT prophylaxis.  She benefited maximally from the hospital stay and there were no complications.    Recent vital signs:  Vitals:   12/28/17 0445 12/28/17 0912  BP: 123/63 117/60  Pulse: 90 74  Resp: 16 16  Temp: 98.1 F (36.7 C) 98.1 F (36.7 C)  SpO2: 93% 94%    Recent laboratory studies:  Lab Results  Component Value Date   HGB 10.6 (L) 12/28/2017   HGB 11.5 (L) 12/19/2017   HGB 11.5 (L) 04/19/2016   Lab Results  Component Value Date   WBC  7.3 12/28/2017   PLT 151 12/28/2017   No results found for: INR Lab Results  Component Value Date   NA 139 12/28/2017   K 4.1 12/28/2017   CL 104 12/28/2017   CO2 27 12/28/2017   BUN 22 12/28/2017   CREATININE 0.78 12/28/2017   GLUCOSE 121 (H) 12/28/2017    Discharge Medications:   Allergies as of 12/28/2017      Reactions   Duraprep [antiseptic Products, Misc.] Itching, Rash   Irritation everywhere prep was used   Betadine [povidone Iodine] Other (See Comments)   Burning sensation.    Hydromorphone Hcl Other (See Comments)   Makes crazy   Morphine And Related Other (See Comments)   hallucinations      Medication List    STOP taking these medications   HYDROcodone-acetaminophen 7.5-325 MG tablet Commonly known as:  NORCO   meloxicam 15 MG tablet Commonly  known as:  MOBIC     TAKE these medications   ALPRAZolam 0.5 MG tablet Commonly known as:  XANAX TAKE ONE TABLET BY MOUTH TWICE A DAY AS NEEDED FOR ANXIETY What changed:    how much to take  how to take this  when to take this  reasons to take this   aspirin EC 325 MG tablet Take 1 tablet (325 mg total) by mouth 2 (two) times daily.   atenolol 25 MG tablet Commonly known as:  TENORMIN TAKE 1 TABLET BY MOUTH  DAILY What changed:  when to take this   atorvastatin 40 MG tablet Commonly known as:  LIPITOR Take 1 tablet (40 mg total) by mouth daily. What changed:  when to take this   CALCIUM 600/VITAMIN D3 PO Take 1 tablet by mouth 2 (two) times daily.   citalopram 40 MG tablet Commonly known as:  CELEXA TAKE 1 TABLET BY MOUTH  DAILY What changed:  when to take this   docusate sodium 100 MG capsule Commonly known as:  COLACE Take 200 mg by mouth at bedtime.   estradiol 0.1 MG/GM vaginal cream Commonly known as:  ESTRACE Place 1 Applicatorful vaginally 3 (three) times a week.   Fish Oil 1200 MG Caps Take 1 capsule by mouth daily.   gemfibrozil 600 MG tablet Commonly known as:  LOPID TAKE 1 TABLET BY MOUTH TWO  TIMES DAILY BEFORE MEALS What changed:  See the new instructions.   hydrocortisone 2.5 % cream APPLY 1 APPLICATION  TOPICALLY 2  TIMES DAILY. What changed:    how much to take  how to take this  when to take this  reasons to take this   levothyroxine 175 MCG tablet Commonly known as:  SYNTHROID, LEVOTHROID TAKE 1 TABLET BY MOUTH  DAILY BEFORE BREAKFAST What changed:  See the new instructions.   lisinopril 10 MG tablet Commonly known as:  PRINIVIL,ZESTRIL TAKE 1 TABLET BY MOUTH ONCE DAILY What changed:  when to take this   loratadine 10 MG tablet Commonly known as:  CLARITIN Take 10 mg by mouth daily as needed for allergies.   LUBRICANT EYE DROPS OP Apply 2 drops to eye 4 (four) times daily as needed (dry eyes).   metFORMIN 1000 MG  tablet Commonly known as:  GLUCOPHAGE Take 1 tablet (1,000 mg total) by mouth 2 (two) times daily with a meal.   multivitamin tablet Take 1 tablet by mouth daily.   ondansetron 4 MG tablet Commonly known as:  ZOFRAN Take 1 tablet (4 mg total) by mouth every 8 (eight) hours as  needed for nausea or vomiting.   ONE TOUCH ULTRA TEST test strip Generic drug:  glucose blood USE 1 DAILY AS DIRECTED   onetouch ultrasoft lancets Use daily   oxyCODONE 5 MG immediate release tablet Commonly known as:  Oxy IR/ROXICODONE Take 1 tablet (5 mg total) by mouth every 4 (four) hours as needed for severe pain.   pantoprazole 40 MG tablet Commonly known as:  PROTONIX Take 1 tablet (40 mg total) by mouth 2 (two) times daily before a meal.   pioglitazone 15 MG tablet Commonly known as:  ACTOS TAKE 1 TABLET BY MOUTH  DAILY What changed:  when to take this   pregabalin 75 MG capsule Commonly known as:  LYRICA Take 1 capsule (75 mg total) by mouth daily. What changed:  when to take this   rOPINIRole 1 MG tablet Commonly known as:  REQUIP Take 1 tablet (1 mg total) by mouth at bedtime.   sennosides-docusate sodium 8.6-50 MG tablet Commonly known as:  SENOKOT-S Take 2 tablets by mouth daily.   tiZANidine 4 MG capsule Commonly known as:  ZANAFLEX Take 1 capsule (4 mg total) by mouth 3 (three) times daily. What changed:    when to take this  reasons to take this   tolnaftate 1 % spray Commonly known as:  TINACTIN Apply topically 2 (two) times daily as needed.   traZODone 100 MG tablet Commonly known as:  DESYREL TAKE 2 TABLETS BY MOUTH AT  BEDTIME       Diagnostic Studies: Dg Knee Left Port  Result Date: 12/27/2017 CLINICAL DATA:  LEFT knee hemiarthroplasty EXAM: PORTABLE LEFT KNEE - 1-2 VIEW COMPARISON:  None. FINDINGS: MEDIAL hemiarthroplasty changes identified without definite complicating features. No acute fracture or dislocation. Soft tissue postoperative changes are noted.  IMPRESSION: MEDIAL hemiarthroplasty changes without definite complicating features. Electronically Signed   By: Harmon Pier M.D.   On: 12/27/2017 10:29    Disposition: Discharge disposition: 01-Home or Self Care         Follow-up Information    Teryl Lucy, MD. Schedule an appointment as soon as possible for a visit in 2 weeks.   Specialty:  Orthopedic Surgery Contact information: [address] [place] [address] [phone]            Signed: Eulas Post 12/28/2017, 1:40 PM

## 2017-12-28 NOTE — Progress Notes (Signed)
Physical Therapy Treatment Patient Details Name: Katie Booker MRN: [id number] DOB: February 09, 1946 Today's Date: 12/28/2017    History of Present Illness 72 yo female s/p L UKR on 12/27/17. PMH includes macular degeneration, anxiety, LBP, neck pain with thoracic discectomy and cervical fusion 2014 and 2016 respectively, DOE, HTN, HLD, peripheral neuropathy, spondylolisthesis L1 L2 with lumbar fusion 2017, DMII.    PT Comments    Pt is progressing well with mobility, she ambulated 220' with RW and performed TKA HEP with supervision. From PT standpoint, she is ready to DC home.    Follow Up Recommendations  Follow surgeon's recommendation for DC plan and follow-up therapies;Supervision for mobility/OOB     Equipment Recommendations  None recommended by PT    Recommendations for Other Services       Precautions / Restrictions Precautions Precautions: Fall Precaution Comments: reviewed no pillow under knee Restrictions Weight Bearing Restrictions: No Other Position/Activity Restrictions: WBAT    Mobility  Bed Mobility               General bed mobility comments: up in recliner  Transfers Overall transfer level: Needs assistance Equipment used: Rolling walker (2 wheeled) Transfers: Sit to/from Stand Sit to Stand: Min guard         General transfer comment: VCs hand placement  Ambulation/Gait Ambulation/Gait assistance: Supervision Gait Distance (Feet): 220 Feet Assistive device: Rolling walker (2 wheeled) Gait Pattern/deviations: Step-to pattern;Decreased stride length;Decreased weight shift to left Gait velocity: decr    General Gait Details: steady with RW, no loss of balance, good sequencing   Stairs             Wheelchair Mobility    Modified Rankin (Stroke Patients Only)       Balance Overall balance assessment: Needs assistance Sitting-balance support: No upper extremity supported Sitting balance-Leahy Scale: Good     Standing balance  support: Bilateral upper extremity supported Standing balance-Leahy Scale: Fair Standing balance comment: relies on RW for support                             Cognition Arousal/Alertness: Awake/alert Behavior During Therapy: WFL for tasks assessed/performed Overall Cognitive Status: Within Functional Limits for tasks assessed                                 General Comments: Pt with some STM deficits evident during PT session, but suspected due to pain medication      Exercises Total Joint Exercises Ankle Circles/Pumps: AROM;Both;10 reps;Seated Quad Sets: AROM;Left;5 reps;Seated Short Arc Quad: AROM;Left;10 reps;Supine Heel Slides: AROM;Left;Seated;10 reps;Supine Hip ABduction/ADduction: AAROM;Left;10 reps;Supine Straight Leg Raises: AAROM;Left;10 reps;Supine Long Arc Quad: AROM;Left;10 reps;Seated Knee Flexion: AAROM;Left;AROM;10 reps;Seated Goniometric ROM: 5-95* AAROM L knee    General Comments        Pertinent Vitals/Pain Pain Score: 4  Pain Location: L knee  Pain Descriptors / Indicators: Sore;Aching Pain Intervention(s): Limited activity within patient's tolerance;Monitored during session;RN gave pain meds during session;Ice applied    Home Living                      Prior Function            PT Goals (current goals can now be found in the care plan section) Acute Rehab PT Goals Patient Stated Goal: none stated  PT Goal Formulation: With patient Time For Goal Achievement: 01/03/18  Potential to Achieve Goals: Good Progress towards PT goals: Progressing toward goals    Frequency    7X/week      PT Plan Current plan remains appropriate    Co-evaluation              AM-PAC PT "6 Clicks" Daily Activity  Outcome Measure  Difficulty turning over in bed (including adjusting bedclothes, sheets and blankets)?: A Little Difficulty moving from lying on back to sitting on the side of the bed? : A Little Difficulty  sitting down on and standing up from a chair with arms (e.g., wheelchair, bedside commode, etc,.)?: A Little Help needed moving to and from a bed to chair (including a wheelchair)?: A Little Help needed walking in hospital room?: A Little Help needed climbing 3-5 steps with a railing? : A Little 6 Click Score: 18    End of Session Equipment Utilized During Treatment: Gait belt Activity Tolerance: Patient tolerated treatment well Patient left: in chair;with chair alarm set;with call bell/phone within reach Nurse Communication: Mobility status PT Visit Diagnosis: Unsteadiness on feet (R26.81);Difficulty in walking, not elsewhere classified (R26.2)     Time: 1000-1040 PT Time Calculation (min) (ACUTE ONLY): 40 min  Charges:  $Gait Training: 8-22 mins $Therapeutic Exercise: 8-22 mins $Therapeutic Activity: 8-22 mins                     Ralene Bathe Kistler PT 12/28/2017  Acute Rehabilitation Services Pager [phone] Office [phone]

## 2017-12-29 ENCOUNTER — Encounter (HOSPITAL_COMMUNITY): Payer: Self-pay | Admitting: Orthopedic Surgery

## 2018-01-09 ENCOUNTER — Telehealth: Payer: Self-pay | Admitting: Family Medicine

## 2018-01-09 DIAGNOSIS — M1712 Unilateral primary osteoarthritis, left knee: Secondary | ICD-10-CM | POA: Diagnosis not present

## 2018-01-09 NOTE — Telephone Encounter (Signed)
Copied from CRM [id number]. Topic: Quick Communication - See Telephone Encounter >> Jan 09, 2018  4:38 PM Jens SomMedley, Jennifer A wrote: CRM for notification. See Telephone encounter for: 01/09/18.  Patient is calling because she has a new patient appointment with Dr. Ardyth HarpsHernandez.  She is calling to see if Dr. Michel SanteeHernadez will write her a script hydrocodone. Please advise

## 2018-01-09 NOTE — Telephone Encounter (Signed)
Pt has been advised we can never guarantee what medications a provider will or will not prescribe. This will be determined by the providers assessment and treatment plan based on medical decision making at the time of visit.   [facility]

## 2018-01-13 ENCOUNTER — Ambulatory Visit (INDEPENDENT_AMBULATORY_CARE_PROVIDER_SITE_OTHER): Payer: Medicare Other | Admitting: Internal Medicine

## 2018-01-13 ENCOUNTER — Encounter: Payer: Self-pay | Admitting: Internal Medicine

## 2018-01-13 ENCOUNTER — Encounter: Payer: Self-pay | Admitting: *Deleted

## 2018-01-13 VITALS — BP 102/64 | HR 70 | Temp 98.1°F | Wt 192.0 lb

## 2018-01-13 DIAGNOSIS — E039 Hypothyroidism, unspecified: Secondary | ICD-10-CM | POA: Diagnosis not present

## 2018-01-13 DIAGNOSIS — Z23 Encounter for immunization: Secondary | ICD-10-CM | POA: Diagnosis not present

## 2018-01-13 DIAGNOSIS — M48062 Spinal stenosis, lumbar region with neurogenic claudication: Secondary | ICD-10-CM

## 2018-01-13 DIAGNOSIS — E785 Hyperlipidemia, unspecified: Secondary | ICD-10-CM

## 2018-01-13 DIAGNOSIS — I10 Essential (primary) hypertension: Secondary | ICD-10-CM | POA: Diagnosis not present

## 2018-01-13 DIAGNOSIS — E119 Type 2 diabetes mellitus without complications: Secondary | ICD-10-CM

## 2018-01-13 DIAGNOSIS — M4804 Spinal stenosis, thoracic region: Secondary | ICD-10-CM

## 2018-01-13 DIAGNOSIS — K219 Gastro-esophageal reflux disease without esophagitis: Secondary | ICD-10-CM | POA: Diagnosis not present

## 2018-01-13 DIAGNOSIS — M4712 Other spondylosis with myelopathy, cervical region: Secondary | ICD-10-CM

## 2018-01-13 DIAGNOSIS — G4700 Insomnia, unspecified: Secondary | ICD-10-CM

## 2018-01-13 DIAGNOSIS — F419 Anxiety disorder, unspecified: Secondary | ICD-10-CM

## 2018-01-13 DIAGNOSIS — Z96659 Presence of unspecified artificial knee joint: Secondary | ICD-10-CM

## 2018-01-13 LAB — HEMOGLOBIN A1C: Hgb A1c MFr Bld: 6.2 % (ref 4.6–6.5)

## 2018-01-13 MED ORDER — CITALOPRAM HYDROBROMIDE 40 MG PO TABS: 40.0000 mg | ORAL_TABLET | Freq: Every day | ORAL | 1 refills | Status: DC

## 2018-01-13 MED ORDER — ESTRADIOL 0.1 MG/GM VA CREA: 1.0000 | TOPICAL_CREAM | VAGINAL | 12 refills | Status: DC

## 2018-01-13 MED ORDER — ATORVASTATIN CALCIUM 40 MG PO TABS: 40.0000 mg | ORAL_TABLET | Freq: Every day | ORAL | 1 refills | Status: DC

## 2018-01-13 MED ORDER — TRAZODONE HCL 100 MG PO TABS: 100.0000 mg | ORAL_TABLET | Freq: Every day | ORAL | 0 refills | Status: DC

## 2018-01-13 MED ORDER — GEMFIBROZIL 600 MG PO TABS: ORAL_TABLET | ORAL | 1 refills | Status: DC

## 2018-01-13 MED ORDER — HYDROCODONE-ACETAMINOPHEN 7.5-325 MG PO TABS: 1.0000 | ORAL_TABLET | Freq: Four times a day (QID) | ORAL | 0 refills | Status: DC | PRN

## 2018-01-13 MED ORDER — TRAZODONE HCL 100 MG PO TABS: 100.0000 mg | ORAL_TABLET | Freq: Every day | ORAL | 1 refills | Status: DC

## 2018-01-13 MED ORDER — PREGABALIN 75 MG PO CAPS: 75.0000 mg | ORAL_CAPSULE | Freq: Every day | ORAL | 1 refills | Status: DC

## 2018-01-13 MED ORDER — PIOGLITAZONE HCL 15 MG PO TABS: 15.0000 mg | ORAL_TABLET | Freq: Every day | ORAL | 1 refills | Status: DC

## 2018-01-13 MED ORDER — TIZANIDINE HCL 4 MG PO CAPS: 4.0000 mg | ORAL_CAPSULE | Freq: Three times a day (TID) | ORAL | 1 refills | Status: DC

## 2018-01-13 MED ORDER — TOLNAFTATE 1 % EX AERP: INHALATION_SPRAY | Freq: Two times a day (BID) | CUTANEOUS | 0 refills | Status: DC | PRN

## 2018-01-13 MED ORDER — ATENOLOL 25 MG PO TABS: 25.0000 mg | ORAL_TABLET | Freq: Every day | ORAL | 1 refills | Status: DC

## 2018-01-13 MED ORDER — METFORMIN HCL 1000 MG PO TABS: 1000.0000 mg | ORAL_TABLET | Freq: Two times a day (BID) | ORAL | 1 refills | Status: DC

## 2018-01-13 MED ORDER — LISINOPRIL 10 MG PO TABS: 10.0000 mg | ORAL_TABLET | Freq: Every day | ORAL | 1 refills | Status: DC

## 2018-01-13 NOTE — Patient Instructions (Signed)
-  It was very nice to meet you.  -Follow-up with me in 3 to 4 months for your annual physical.  Please come fasting to this visit.

## 2018-01-13 NOTE — Progress Notes (Signed)
Established Patient Office Visit     CC/Reason for Visit: Establish care, medication refills, follow-up on chronic medical conditions  HPI: Katie Booker is a 72 y.o. female who is coming in today for the above mentioned reasons.  She is past due for her annual physical.  Past Medical History is significant for: Spinal stenosis of cervical, thoracic, lumbar spine followed by Dr. Danielle DessElsner and on chronic hydrocodone previously prescribed by Dr. Kirtland BouchardK, anxiety on Xanax, insomnia on trazodone, diabetes, hypertension, hyperlipidemia, hypothyroidism.  She is here today because she needs medication refills.  On November 5 she had a left total knee replacement by Dr. Dion SaucierLandau, saw him yesterday for her first postop visit, she has been doing well.  Is walking currently with a walker.   Past Medical/Surgical History: Past Medical History:  Diagnosis Date  . Age-related macular degeneration, wet, right eye (HCC)    per pt has had treatment in past  . Anxiety   . Chronic constipation   . Chronic low back pain   . Chronic neck pain   . 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 Depression   . DOE (dyspnea on exertion)   . GERD   . Headache   . Hemorrhoids   . History of basal cell carcinoma excision    left cheek  . History of colon polyps   . History of hyperthyroidism 2011   RAI treatement  . History of panic attacks   . Hyperlipidemia   . Hypertension   . Hypothyroidism, postradioiodine therapy    followed by pcp  . IDA (iron deficiency anemia)    intermittant  . Insomnia   . OA (osteoarthritis)   . OSA (obstructive sleep apnea)    no cpap, per pt tried unable to tolerate  . Peripheral neuropathy    legs and feet from hx back surgery's  . Rash    bilateral axilla area -- per pt due to some personal wipes used other the counter  . RLS (restless legs syndrome)   . Spondylolisthesis at L1-L2 level   . Tingling of both upper  extremities    left greater than right due to cervical pinched nerve  . Type 2 diabetes mellitus (HCC)    followed by pcp  . Umbilical hernia   . Urinary frequency   . Urinary urgency     Past Surgical History:  Procedure Laterality Date  . ANTERIOR CERVICAL DECOMP/DISCECTOMY FUSION N/A 09/02/2014   Procedure: Cervical five- six  Anterior cervical decompression fusion;  Surgeon: Barnett AbuHenry Elsner, MD;  Location: [facility];  Service: Neurosurgery;  Laterality: N/A;  C5-6 Anterior cervical decompression/diskectomy/fusion  . CARPAL TUNNEL RELEASE Right 2016  . CATARACT EXTRACTION W/ INTRAOCULAR LENS  IMPLANT, BILATERAL  03/2015  . COLONOSCOPY  2007  . ESOPHAGOGASTRODUODENOSCOPY  2007  . HEMORRHOID SURGERY  03/23/2011   Procedure: HEMORRHOIDECTOMY;  Surgeon: Clovis Puhomas A. Cornett, MD;  Location: [facility];  Service: General;  Laterality: N/A;  lateral internal sphincterotomy and hemorrhoidectomy  . LUMBAR FUSION  02/2015   L1 -- 2  . PARTIAL KNEE ARTHROPLASTY Left 12/27/2017   Procedure: UNICOMPARTMENTAL LEFT KNEE;  Surgeon: Teryl LucyLandau, Joshua, MD;  Location: [facility];  Service: Orthopedics;  Laterality: Left;  . THORACIC DISCECTOMY N/A 12/15/2012   Procedure: Thoracic ten-eleven Thoracic laminectomy;  Surgeon: Barnett AbuHenry Elsner, MD;  Location: [facility];  Service: Neurosurgery;  Laterality: N/A;  Thoracic ten-eleven Thoracic  laminectomy  . TUBAL LIGATION Bilateral 1991    Social History:  reports that she quit smoking about 13 years ago. Her smoking use included cigarettes. She has a 20.00 pack-year smoking history. She has never used smokeless tobacco. She reports that she drinks alcohol. She reports that she does not use drugs.  Allergies: Allergies  Allergen Reactions  . Duraprep [employer], Misc.] Itching and Rash    Irritation everywhere prep was used  . Betadine [Povidone Iodine] Other (See Comments)    Burning sensation.   . Hydromorphone Hcl Other (See Comments)     Makes crazy  . Morphine And Related Other (See Comments)    hallucinations    Family History:  Family History  Problem Relation Age of Onset  . Diabetes Father   . COPD Father   . Cancer Maternal Grandfather        stomach     Current Outpatient Medications:  .  ALPRAZolam (XANAX) 0.5 MG tablet, TAKE ONE TABLET BY MOUTH TWICE A DAY AS NEEDED FOR ANXIETY (Patient taking differently: Take 0.5 mg by mouth 2 (two) times daily as needed for anxiety. TAKE ONE TABLET BY MOUTH TWICE A DAY AS NEEDED FOR ANXIETY), Disp: 60 tablet, Rfl: 1 .  aspirin EC 325 MG tablet, Take 1 tablet (325 mg total) by mouth 2 (two) times daily., Disp: 60 tablet, Rfl: 0 .  atenolol (TENORMIN) 25 MG tablet, TAKE 1 TABLET BY MOUTH  DAILY (Patient taking differently: Take 25 mg by mouth every morning. ), Disp: 90 tablet, Rfl: 1 .  atorvastatin (LIPITOR) 40 MG tablet, Take 1 tablet (40 mg total) by mouth daily. (Patient taking differently: Take 40 mg by mouth at bedtime. ), Disp: 90 tablet, Rfl: 3 .  Calcium Carb-Cholecalciferol (CALCIUM 600/VITAMIN D3 PO), Take 1 tablet by mouth 2 (two) times daily., Disp: , Rfl:  .  Carboxymethylcellulose Sodium (LUBRICANT EYE DROPS OP), Apply 2 drops to eye 4 (four) times daily as needed (dry eyes). , Disp: , Rfl:  .  citalopram (CELEXA) 40 MG tablet, TAKE 1 TABLET BY MOUTH  DAILY (Patient taking differently: Take 40 mg by mouth every morning. ), Disp: 90 tablet, Rfl: 2 .  docusate sodium (COLACE) 100 MG capsule, Take 200 mg by mouth at bedtime. , Disp: , Rfl:  .  estradiol (ESTRACE) 0.1 MG/GM vaginal cream, Place 1 Applicatorful vaginally 3 (three) times a week., Disp: 42.5 g, Rfl: 12 .  gemfibrozil (LOPID) 600 MG tablet, TAKE 1 TABLET BY MOUTH TWO  TIMES DAILY BEFORE MEALS (Patient taking differently: Take 600 mg by mouth 2 (two) times daily before a meal. ), Disp: 180 tablet, Rfl: 1 .  HYDROcodone-acetaminophen (NORCO) 7.5-325 MG tablet, Take 1 tablet by mouth every 6 (six) hours as  needed for moderate pain., Disp: 120 tablet, Rfl: 0 .  hydrocortisone 2.5 % cream, APPLY 1 APPLICATION  TOPICALLY 2  TIMES DAILY. (Patient taking differently: Apply 1 application topically 2 (two) times daily as needed (irritation). APPLY 1 APPLICATION  TOPICALLY 2  TIMES DAILY.), Disp: 84 g, Rfl: 0 .  Lancets (ONETOUCH ULTRASOFT) lancets, Use daily, Disp: 100 each, Rfl: 6 .  levothyroxine (SYNTHROID, LEVOTHROID) 175 MCG tablet, TAKE 1 TABLET BY MOUTH  DAILY BEFORE BREAKFAST (Patient taking differently: Take 175 mcg by mouth daily before breakfast. ), Disp: 90 tablet, Rfl: 5 .  lisinopril (PRINIVIL,ZESTRIL) 10 MG tablet, TAKE 1 TABLET BY MOUTH ONCE DAILY (Patient taking differently: Take 10 mg by mouth every morning. ), Disp:  90 tablet, Rfl: 1 .  loratadine (CLARITIN) 10 MG tablet, Take 10 mg by mouth daily as needed for allergies., Disp: , Rfl:  .  metFORMIN (GLUCOPHAGE) 1000 MG tablet, Take 1 tablet (1,000 mg total) by mouth 2 (two) times daily with a meal., Disp: 180 tablet, Rfl: 0 .  Multiple Vitamin (MULTIVITAMIN) tablet, Take 1 tablet by mouth daily., Disp: , Rfl:  .  Omega-3 Fatty Acids (FISH OIL) 1200 MG CAPS, Take 1 capsule by mouth daily. , Disp: , Rfl:  .  ONE TOUCH ULTRA TEST test strip, USE 1 DAILY AS DIRECTED, Disp: 100 each, Rfl: 3 .  pantoprazole (PROTONIX) 40 MG tablet, Take 1 tablet (40 mg total) by mouth 2 (two) times daily before a meal., Disp: 180 tablet, Rfl: 3 .  pioglitazone (ACTOS) 15 MG tablet, TAKE 1 TABLET BY MOUTH  DAILY (Patient taking differently: Take 15 mg by mouth every morning. ), Disp: 90 tablet, Rfl: 1 .  pregabalin (LYRICA) 75 MG capsule, Take 1 capsule (75 mg total) by mouth daily. (Patient taking differently: Take 75 mg by mouth at bedtime. ), Disp: 90 capsule, Rfl: 1 .  rOPINIRole (REQUIP) 1 MG tablet, Take 1 tablet (1 mg total) by mouth at bedtime., Disp: 90 tablet, Rfl: 4 .  sennosides-docusate sodium (SENOKOT-S) 8.6-50 MG tablet, Take 2 tablets by mouth  daily., Disp: 30 tablet, Rfl: 1 .  tiZANidine (ZANAFLEX) 4 MG capsule, Take 1 capsule (4 mg total) by mouth 3 (three) times daily. (Patient taking differently: Take 4 mg by mouth 3 (three) times daily as needed for muscle spasms. ), Disp: 60 capsule, Rfl: 3 .  tolnaftate (TINACTIN) 1 % spray, Apply topically 2 (two) times daily as needed., Disp: , Rfl:  .  traZODone (DESYREL) 100 MG tablet, Take 1 tablet (100 mg total) by mouth at bedtime., Disp: 30 tablet, Rfl: 0  Review of Systems:  Constitutional: Denies fever, chills, diaphoresis, appetite change and fatigue.  HEENT: Denies photophobia, eye pain, redness, hearing loss, ear pain, congestion, sore throat, rhinorrhea, sneezing, mouth sores, trouble swallowing, neck pain, neck stiffness and tinnitus.   Respiratory: Denies SOB, DOE, cough, chest tightness,  and wheezing.   Cardiovascular: Denies chest pain, palpitations and leg swelling.  Gastrointestinal: Denies nausea, vomiting, abdominal pain, diarrhea, constipation, blood in stool and abdominal distention.  Genitourinary: Denies dysuria, urgency, frequency, hematuria, flank pain and difficulty urinating.  Endocrine: Denies: hot or cold intolerance, sweats, changes in hair or nails, polyuria, polydipsia. Musculoskeletal: Positive for, back pain, joint swelling, arthralgias and gait problem.  Skin: Denies pallor, rash and wound.  Neurological: Denies dizziness, seizures, syncope, weakness, light-headedness, numbness and headaches.  Hematological: Denies adenopathy. Easy bruising, personal or family bleeding history  Psychiatric/Behavioral: Denies suicidal ideation, mood changes, confusion, nervousness, sleep disturbance and agitation    Physical Exam: Vitals:   01/13/18 1143  BP: 102/64  Pulse: 70  Temp: 98.1 F (36.7 C)  TempSrc: Oral  SpO2: 95%  Weight: 192 lb (87.1 kg)    Body mass index is 35.12 kg/m.   Constitutional: NAD, calm, comfortable Eyes: PERRL, lids and  conjunctivae normal ENMT: Mucous membranes are moist. Posterior pharynx clear of any exudate or lesions. Normal dentition. Neck: normal, supple, no masses, no thyromegaly Respiratory: clear to auscultation bilaterally, no wheezing, no crackles. Normal respiratory effort. No accessory muscle use.  Cardiovascular: Regular rate and rhythm, no murmurs / rubs / gallops. No extremity edema. 2+ pedal pulses. No carotid bruits.  Abdomen: no tenderness, no masses palpated.  No hepatosplenomegaly. Bowel sounds positive.  Musculoskeletal: no clubbing / cyanosis. No joint deformity upper and lower extremities. Good ROM, no contractures. Normal muscle tone.  Skin: no rashes, lesions, ulcers. No induration Neurologic: CN 2-12 grossly intact. Sensation intact, DTR normal. Strength 5/5 in all 4.  Psychiatric: Normal judgment and insight. Alert and oriented x 3. Normal mood.    Impression and Plan:  Acquired hypothyroidism  Diabetes mellitus without complication (HCC) - Plan: Hemoglobin A1c  Dyslipidemia  Essential hypertension  Gastroesophageal reflux disease without esophagitis  Anxiety  Status post knee replacement, unspecified laterality  Spinal stenosis, lumbar region, with neurogenic claudication - Plan: HYDROcodone-acetaminophen (NORCO) 7.5-325 MG tablet  Spinal stenosis, thoracic  Cervical spondylosis with myelopathy  Insomnia, unspecified type - Plan: traZODone (DESYREL) 100 MG tablet  Morbid obesity (HCC)  -She is mainly here today to meet me and for medication refills. -She is due for hemoglobin A1c.  Her diabetes has been well controlled. Lab Results  Component Value Date   HGBA1C 5.9 (H) 12/19/2017   -She is also past due for an annual physical and we will schedule this in 3 to 4 months. -She has been counseled on lifestyle modifications. -Her hypertension is well controlled on current medications, -She remains on Lipitor, fish oil and gemfibrozil for hyperlipidemia -Is on  Synthroid for hypothyroidism. -I have agreed to assume care for her chronic hydrocodone prescription.  Please see below:  [employer], initialed  and scanned into the EMR.   Indication for chronic opioid:  Spinal stenosis of cervical, thoracic, lumbar spine with chronic back pain Medication and dose:  Hydrocodone 7.5 every 6 hours as needed # pills per month:  120 Last UDS date:  Will obtain at next visit Opioid Treatment Agreement signed:  Yes Opioid Treatment Agreement last reviewed with patient:  01/13/2018 [facility] reviewed this encounter (include red flags):   Yes, no red flags, overdose risk score is 370.    Patient Instructions  -It was very nice to meet you.  -Follow-up with me in 3 to 4 months for your annual physical.  Please come fasting to this visit.     Chaya Jan, MD [facility]

## 2018-01-13 NOTE — Addendum Note (Signed)
Addended by: Kern ReapVEREEN, RACHEL B on: 01/13/2018 01:21 PM   Modules accepted: Orders

## 2018-01-23 ENCOUNTER — Encounter: Payer: Self-pay | Admitting: Internal Medicine

## 2018-01-24 ENCOUNTER — Telehealth: Payer: Self-pay | Admitting: Internal Medicine

## 2018-01-24 NOTE — Telephone Encounter (Signed)
Copied from CRM [id number]. Topic: Quick Communication - See Telephone Encounter >> Jan 24, 2018 10:24 AM Jolayne Hainesaylor, Brittany L wrote: CRM for notification. See Telephone encounter for: 01/24/18.  Patient states she just got off the phone with [facility] and they advised her that they faxed initially on 11/22 for clarification on several of her medications. Patient is not sure of which ones but would like Dr Ardyth HarpsHernandez to look into that and get that back over to [facility]. She said that her medications have not been filled yet due to this. She did say that [facility] tried to call this morning and waited for two minutes and hung up. Advise.

## 2018-01-26 NOTE — Telephone Encounter (Signed)
Paperwork faxed with "Okay" confirmation.

## 2018-01-27 ENCOUNTER — Other Ambulatory Visit: Payer: Self-pay | Admitting: Internal Medicine

## 2018-01-27 ENCOUNTER — Telehealth: Payer: Self-pay | Admitting: Internal Medicine

## 2018-01-27 DIAGNOSIS — F419 Anxiety disorder, unspecified: Secondary | ICD-10-CM

## 2018-01-27 MED ORDER — ALPRAZOLAM 0.5 MG PO TABS: ORAL_TABLET | ORAL | 1 refills | Status: DC

## 2018-01-27 NOTE — Telephone Encounter (Signed)
Patient is aware and will call and schedule an appointment before her next refill.

## 2018-01-27 NOTE — Telephone Encounter (Signed)
Rx sent to pharmacy for 1 month. We did not discuss this at our visit. If she needs continuous refills before her next appointment, will need to follow up with me sooner. Thanks.

## 2018-01-27 NOTE — Telephone Encounter (Signed)
Copied from CRM [id number]. Topic: Quick Communication - See Telephone Encounter >> Jan 27, 2018 11:50 AM Windy KalataMichael, Katie Booker, NT wrote: CRM for notification. See Telephone encounter for: 01/27/18.  [facility] and states that he is trying to fill ALPRAZolam Prudy Feeler(XANAX) 0.5 MG tablet but it will not let him bill it unless it is a active DEA number. Please advise.

## 2018-02-01 ENCOUNTER — Telehealth: Payer: Self-pay | Admitting: Internal Medicine

## 2018-02-01 NOTE — Telephone Encounter (Signed)
TC from [facility] to verify Zanaflex dosage. Verified dosage 4 MG taken 3 times a day. Verified by OV note 01/13/18.

## 2018-02-06 DIAGNOSIS — M1712 Unilateral primary osteoarthritis, left knee: Secondary | ICD-10-CM | POA: Diagnosis not present

## 2018-02-16 ENCOUNTER — Other Ambulatory Visit: Payer: Self-pay | Admitting: Internal Medicine

## 2018-02-16 DIAGNOSIS — M48062 Spinal stenosis, lumbar region with neurogenic claudication: Secondary | ICD-10-CM

## 2018-02-16 NOTE — Telephone Encounter (Signed)
Requested medication (s) are due for refill today: yes  Requested medication (s) are on the active medication list: yes  Last refill:  01/13/18  Future visit scheduled: yes  Notes to clinic:  Controlled substance   Requested Prescriptions  Pending Prescriptions Disp Refills   HYDROcodone-acetaminophen (NORCO) 7.5-325 MG tablet 120 tablet 0    Sig: Take 1 tablet by mouth every 6 (six) hours as needed for moderate pain.     Not Delegated - Analgesics:  Opioid Agonist Combinations Failed - 02/16/2018  2:18 PM      Failed - This refill cannot be delegated      Failed - Urine Drug Screen completed in last 360 days.      Passed - Valid encounter within last 6 months    Recent Outpatient Visits          1 month ago Acquired hypothyroidism   [facility] Acosta, Limmie PatriciaEstela Y, MD   5 months ago Diabetes mellitus without complication [facility](HCC)   [facility] Kwiat[facility], Janett LabellaPeter F, MD   8 months ago Essential hypertension   [facility] Kwiat[facility], Janett LabellaPeter F, MD   11 months ago Chronic right-sided back pain, unspecified back location   [facility] Kwiat[facility], Janett LabellaPeter F, MD   1 year ago Need for influenza vaccination   [facility] Kwiatkowski, Janett LabellaPeter F, MD      Future Appointments            In 2 months Philip AspenHernandez Acosta, Limmie PatriciaEstela Y, MD [facility]

## 2018-02-16 NOTE — Telephone Encounter (Signed)
Copied from CRM [id number]. Topic: Quick Communication - Rx Refill/Question >> Feb 16, 2018  2:07 PM Gerrianne ScalePayne, Talah Cookston L wrote: Medication: HYDROcodone-acetaminophen (NORCO) 7.5-325 MG tablet  Has the patient contacted their pharmacy? Yes.   (Agent: If no, request that the patient contact the pharmacy for the refill.) (Agent: If yes, when and what did the pharmacy advise?) to call provider for more refills  Preferred Pharm[facility] (with phone number or street name): [facility] [address] - [place], [place] - [address] [phone] (Phone) [phone] (Fax)    Agent: Please be advised that RX refills may take up to 3 business days. We ask that you follow-up with your pharmacy.

## 2018-02-20 NOTE — Telephone Encounter (Signed)
Pt has only one pill left. Pt is aware md out of office until jan

## 2018-02-21 MED ORDER — HYDROCODONE-ACETAMINOPHEN 7.5-325 MG PO TABS: 1.0000 | ORAL_TABLET | Freq: Four times a day (QID) | ORAL | 0 refills | Status: DC | PRN

## 2018-02-21 NOTE — Telephone Encounter (Signed)
This was sent in  

## 2018-02-27 ENCOUNTER — Telehealth: Payer: Self-pay | Admitting: Internal Medicine

## 2018-02-27 NOTE — Telephone Encounter (Signed)
Copied from CRM [id number]. Topic: Quick Communication - Rx Refill/Question >> Feb 27, 2018 10:21 AM Baldo Daub L wrote: Medication:HYDROcodone-acetaminophen (NORCO) 7.5-325 MG tablet   Has the patient contacted their pharmacy? No - states [name] at the office told her to call the office to request that Dr. Ardyth Harps fill this (Agent: If no, request that the patient contact the pharmacy for the refill.) (Agent: If yes, when and what did the pharmacy advise?)  Preferred Pharmacy (with phone number or street name): [facility] [address] [place] - [address] [phone] (Phone) [phone] (Fax)  Agent: Please be advised that RX refills may take up to 3 business days. We ask that you follow-up with your pharmacy.

## 2018-03-01 ENCOUNTER — Ambulatory Visit (INDEPENDENT_AMBULATORY_CARE_PROVIDER_SITE_OTHER): Payer: Medicare Other | Admitting: Internal Medicine

## 2018-03-01 ENCOUNTER — Encounter: Payer: Self-pay | Admitting: *Deleted

## 2018-03-01 ENCOUNTER — Encounter: Payer: Self-pay | Admitting: Internal Medicine

## 2018-03-01 VITALS — BP 110/70 | HR 69 | Temp 98.4°F | Wt 184.0 lb

## 2018-03-01 DIAGNOSIS — F419 Anxiety disorder, unspecified: Secondary | ICD-10-CM | POA: Diagnosis not present

## 2018-03-01 DIAGNOSIS — M48062 Spinal stenosis, lumbar region with neurogenic claudication: Secondary | ICD-10-CM | POA: Diagnosis not present

## 2018-03-01 DIAGNOSIS — Z5181 Encounter for therapeutic drug level monitoring: Secondary | ICD-10-CM | POA: Diagnosis not present

## 2018-03-01 MED ORDER — ALPRAZOLAM 0.5 MG PO TABS: 0.5000 mg | ORAL_TABLET | Freq: Two times a day (BID) | ORAL | 0 refills | Status: DC | PRN

## 2018-03-01 MED ORDER — HYDROCODONE-ACETAMINOPHEN 7.5-325 MG PO TABS: 1.0000 | ORAL_TABLET | Freq: Four times a day (QID) | ORAL | 0 refills | Status: DC | PRN

## 2018-03-01 MED ORDER — ALPRAZOLAM 0.5 MG PO TABS: ORAL_TABLET | ORAL | 0 refills | Status: DC

## 2018-03-01 MED ORDER — ALPRAZOLAM 0.5 MG PO TABS: 0.5000 mg | ORAL_TABLET | Freq: Every evening | ORAL | 0 refills | Status: DC | PRN

## 2018-03-01 NOTE — Progress Notes (Signed)
Established Patient Office Visit     CC/Reason for Visit: Medication refills  HPI: Katie Booker is a 73 y.o. female who is coming in today for the above mentioned reasons.  She is coming in for annual physical in March 2020.  Past Medical History is significant for: Spinal stenosis for which she takes hydrocodone, anxiety for which she takes Ativan and trazodone that she uses at bedtime for sleeping purposes.  She is coming in today for every 3 month visit for medication refills.  She is upset that her medications were delayed over the holidays because I was out of the office.  Other than this no acute complaints.   Past Medical/Surgical History: Past Medical History:  Diagnosis Date  . Age-related macular degeneration, wet, right eye (HCC)    per pt has had treatment in past  . Anxiety   . Chronic constipation   . Chronic low back pain   . Chronic neck pain   . 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 Depression   . DOE (dyspnea on exertion)   . GERD   . Headache   . Hemorrhoids   . History of basal cell carcinoma excision    left cheek  . History of colon polyps   . History of hyperthyroidism 2011   RAI treatement  . History of panic attacks   . Hyperlipidemia   . Hypertension   . Hypothyroidism, postradioiodine therapy    followed by pcp  . IDA (iron deficiency anemia)    intermittant  . Insomnia   . OA (osteoarthritis)   . OSA (obstructive sleep apnea)    no cpap, per pt tried unable to tolerate  . Peripheral neuropathy    legs and feet from hx back surgery's  . Rash    bilateral axilla area -- per pt due to some personal wipes used other the counter  . RLS (restless legs syndrome)   . Spondylolisthesis at L1-L2 level   . Tingling of both upper extremities    left greater than right due to cervical pinched nerve  . Type 2 diabetes mellitus (HCC)    followed by pcp  . Umbilical hernia   . Urinary  frequency   . Urinary urgency     Past Surgical History:  Procedure Laterality Date  . ANTERIOR CERVICAL DECOMP/DISCECTOMY FUSION N/A 09/02/2014   Procedure: Cervical five- six  Anterior cervical decompression fusion;  Surgeon: Barnett AbuHenry Elsner, MD;  Location: [facility];  Service: Neurosurgery;  Laterality: N/A;  C5-6 Anterior cervical decompression/diskectomy/fusion  . CARPAL TUNNEL RELEASE Right 2016  . CATARACT EXTRACTION W/ INTRAOCULAR LENS  IMPLANT, BILATERAL  03/2015  . COLONOSCOPY  2007  . ESOPHAGOGASTRODUODENOSCOPY  2007  . HEMORRHOID SURGERY  03/23/2011   Procedure: HEMORRHOIDECTOMY;  Surgeon: Clovis Puhomas A. Cornett, MD;  Location: [facility];  Service: General;  Laterality: N/A;  lateral internal sphincterotomy and hemorrhoidectomy  . LUMBAR FUSION  02/2015   L1 -- 2  . PARTIAL KNEE ARTHROPLASTY Left 12/27/2017   Procedure: UNICOMPARTMENTAL LEFT KNEE;  Surgeon: Teryl LucyLandau, Joshua, MD;  Location: [facility];  Service: Orthopedics;  Laterality: Left;  . THORACIC DISCECTOMY N/A 12/15/2012   Procedure: Thoracic ten-eleven Thoracic laminectomy;  Surgeon: Barnett AbuHenry Elsner, MD;  Location: [facility];  Service: Neurosurgery;  Laterality: N/A;  Thoracic ten-eleven Thoracic laminectomy  . TUBAL LIGATION Bilateral 1991    Social History:  reports that she  quit smoking about 14 years ago. Her smoking use included cigarettes. She has a 20.00 pack-year smoking history. She has never used smokeless tobacco. She reports current alcohol use. She reports that she does not use drugs.  Allergies: Allergies  Allergen Reactions  . Duraprep [employer], Misc.] Itching and Rash    Irritation everywhere prep was used  . Betadine [Povidone Iodine] Other (See Comments)    Burning sensation.   . Hydromorphone Hcl Other (See Comments)    Makes crazy  . Morphine And Related Other (See Comments)    hallucinations    Family History:  Family History  Problem Relation Age of Onset  . Diabetes  Father   . COPD Father   . Cancer Maternal Grandfather        stomach     Current Outpatient Medications:  .  ALPRAZolam (XANAX) 0.5 MG tablet, TAKE ONE TABLET BY MOUTH TWICE A DAY AS NEEDED FOR ANXIETY, Disp: 60 tablet, Rfl: 1 .  atenolol (TENORMIN) 25 MG tablet, Take 1 tablet (25 mg total) by mouth daily., Disp: 90 tablet, Rfl: 1 .  atorvastatin (LIPITOR) 40 MG tablet, Take 1 tablet (40 mg total) by mouth daily., Disp: 90 tablet, Rfl: 1 .  Calcium Carb-Cholecalciferol (CALCIUM 600/VITAMIN D3 PO), Take 1 tablet by mouth 2 (two) times daily., Disp: , Rfl:  .  Carboxymethylcellulose Sodium (LUBRICANT EYE DROPS OP), Apply 2 drops to eye 4 (four) times daily as needed (dry eyes). , Disp: , Rfl:  .  citalopram (CELEXA) 40 MG tablet, Take 1 tablet (40 mg total) by mouth daily., Disp: 90 tablet, Rfl: 1 .  docusate sodium (COLACE) 100 MG capsule, Take 200 mg by mouth at bedtime. , Disp: , Rfl:  .  estradiol (ESTRACE) 0.1 MG/GM vaginal cream, Place 1 Applicatorful vaginally 3 (three) times a week., Disp: 42.5 g, Rfl: 12 .  gemfibrozil (LOPID) 600 MG tablet, TAKE 1 TABLET BY MOUTH TWO  TIMES DAILY BEFORE MEALS, Disp: 180 tablet, Rfl: 1 .  HYDROcodone-acetaminophen (NORCO) 7.5-325 MG tablet, Take 1 tablet by mouth every 6 (six) hours as needed for moderate pain., Disp: 30 tablet, Rfl: 0 .  hydrocortisone 2.5 % cream, APPLY 1 APPLICATION  TOPICALLY 2  TIMES DAILY. (Patient taking differently: Apply 1 application topically 2 (two) times daily as needed (irritation). APPLY 1 APPLICATION  TOPICALLY 2  TIMES DAILY.), Disp: 84 g, Rfl: 0 .  Lancets (ONETOUCH ULTRASOFT) lancets, Use daily, Disp: 100 each, Rfl: 6 .  levothyroxine (SYNTHROID, LEVOTHROID) 175 MCG tablet, TAKE 1 TABLET BY MOUTH  DAILY BEFORE BREAKFAST (Patient taking differently: Take 175 mcg by mouth daily before breakfast. ), Disp: 90 tablet, Rfl: 5 .  lisinopril (PRINIVIL,ZESTRIL) 10 MG tablet, Take 1 tablet (10 mg total) by mouth daily., Disp: 90  tablet, Rfl: 1 .  loratadine (CLARITIN) 10 MG tablet, Take 10 mg by mouth daily as needed for allergies., Disp: , Rfl:  .  metFORMIN (GLUCOPHAGE) 1000 MG tablet, Take 1 tablet (1,000 mg total) by mouth 2 (two) times daily with a meal., Disp: 180 tablet, Rfl: 1 .  Multiple Vitamin (MULTIVITAMIN) tablet, Take 1 tablet by mouth daily., Disp: , Rfl:  .  Omega-3 Fatty Acids (FISH OIL) 1200 MG CAPS, Take 1 capsule by mouth daily. , Disp: , Rfl:  .  ONE TOUCH ULTRA TEST test strip, USE 1 DAILY AS DIRECTED, Disp: 100 each, Rfl: 3 .  pantoprazole (PROTONIX) 40 MG tablet, Take 1 tablet (40 mg total) by mouth 2 (  two) times daily before a meal., Disp: 180 tablet, Rfl: 3 .  pioglitazone (ACTOS) 15 MG tablet, Take 1 tablet (15 mg total) by mouth daily., Disp: 90 tablet, Rfl: 1 .  pregabalin (LYRICA) 75 MG capsule, Take 1 capsule (75 mg total) by mouth daily., Disp: 90 capsule, Rfl: 1 .  rOPINIRole (REQUIP) 1 MG tablet, Take 1 tablet (1 mg total) by mouth at bedtime., Disp: 90 tablet, Rfl: 4 .  sennosides-docusate sodium (SENOKOT-S) 8.6-50 MG tablet, Take 2 tablets by mouth daily., Disp: 30 tablet, Rfl: 1 .  tiZANidine (ZANAFLEX) 4 MG capsule, Take 1 capsule (4 mg total) by mouth 3 (three) times daily., Disp: 180 capsule, Rfl: 1 .  tolnaftate (TINACTIN) 1 % spray, Apply topically 2 (two) times daily as needed., Disp: 130 g, Rfl: 0 .  traZODone (DESYREL) 100 MG tablet, Take 1 tablet (100 mg total) by mouth at bedtime., Disp: 90 tablet, Rfl: 1  Review of Systems:  Constitutional: Denies fever, chills, diaphoresis, appetite change and fatigue.  HEENT: Denies photophobia, eye pain, redness, hearing loss, ear pain, congestion, sore throat, rhinorrhea, sneezing, mouth sores, trouble swallowing, neck pain, neck stiffness and tinnitus.   Respiratory: Denies SOB, DOE, cough, chest tightness,  and wheezing.   Cardiovascular: Denies chest pain, palpitations and leg swelling.  Gastrointestinal: Denies nausea, vomiting,  abdominal pain, diarrhea, constipation, blood in stool and abdominal distention.  Genitourinary: Denies dysuria, urgency, frequency, hematuria, flank pain and difficulty urinating.  Endocrine: Denies: hot or cold intolerance, sweats, changes in hair or nails, polyuria, polydipsia. Musculoskeletal: Denies myalgias, back pain, joint swelling, arthralgias and gait problem.  Skin: Denies pallor, rash and wound.  Neurological: Denies dizziness, seizures, syncope, weakness, light-headedness, numbness and headaches.  Hematological: Denies adenopathy. Easy bruising, personal or family bleeding history  Psychiatric/Behavioral: Denies suicidal ideation, mood changes, confusion, nervousness, sleep disturbance and agitation    Physical Exam: Vitals:   03/01/18 0754  BP: 110/70  Pulse: 69  Temp: 98.4 F (36.9 C)  TempSrc: Oral  SpO2: 97%  Weight: 184 lb (83.5 kg)    Body mass index is 33.65 kg/m.   Constitutional: NAD, calm, comfortable Eyes: PERRL, lids and conjunctivae normal ENMT: Mucous membranes are moist. Posterior pharynx clear of any exudate or lesions. Normal dentition. Tympanic membrane is pearly white, no erythema or bulging. Neck: normal, supple, no masses, no thyromegaly Respiratory: clear to auscultation bilaterally, no wheezing, no crackles. Normal respiratory effort. No accessory muscle use.  Cardiovascular: Regular rate and rhythm, no murmurs / rubs / gallops. No extremity edema. 2+ pedal pulses. No carotid bruits.  Abdomen: no tenderness, no masses palpated. No hepatosplenomegaly. Bowel sounds positive.  Musculoskeletal: no clubbing / cyanosis. No joint deformity upper and lower extremities. Good ROM, no contractures. Normal muscle tone.  Skin: no rashes, lesions, ulcers. No induration Neurologic: CN 2-12 grossly intact. Sensation intact, DTR normal. Strength 5/5 in all 4.  Psychiatric: Normal judgment and insight. Alert and oriented x 3. Normal mood.    Impression and  Plan:  Encounter for therapeutic drug monitoring Spinal stenosis, lumbar region, with neurogenic claudication  Anxiety   -Urine drug screen to be performed today.  NCCSRS database printed, initialed  and scanned into the EMR.   Indication for chronic opioid:  Spinal stenosis of cervical, thoracic, lumbar spine with chronic back pain Medication and dose:  Hydrocodone 7.5 mg every 6 hours as needed # pills per month:  120/month Last UDS date:  Obtained today Opioid Treatment Agreement signed:  Yes Opioid Treatment  Agreement last reviewed with patient:  01/13/2018 NCCSRS reviewed this encounter (include red flags):   Yes, no red flags, overdose risk score is 460.  NCCSRS database printed, initialed  and scanned into the EMR.   Indication for chronic benzodiazepine:  Anxiety Medication and dose:  Xanax 0.5 mg twice daily as needed # pills per month:  60 Last UDS date:  Obtained today  [facility] Treatment Agreement signed:  03/01/2018 [facility] Treatment Agreement last reviewed with patient: 03/01/2018 NCCSRS reviewed this encounter (include red flags):   Yes, no red flags, overdose risk score 460     Patient Instructions  -It was nice seeing you today!  -Urine drug screen today as per pain contract.  -Refills of Xanax and hydrocodone will be given today.  -Please follow-up as scheduled in March for your annual physical.     Chaya JanEstela Hernandez Acosta, MD [facility]

## 2018-03-01 NOTE — Patient Instructions (Signed)
-  It was nice seeing you today!  -Urine drug screen today as per pain contract.  -Refills of Xanax and hydrocodone will be given today.  -Please follow-up as scheduled in March for your annual physical.

## 2018-03-01 NOTE — Telephone Encounter (Signed)
Refills were sent at office visit today 03/01/2018

## 2018-03-04 LAB — PAIN MGMT, PROFILE 8 W/CONF, U
6 Acetylmorphine: NEGATIVE ng/mL (ref <10)
ALCOHOL METABOLITES: NEGATIVE ng/mL (ref <500)
ALPHAHYDROXYALPRAZOLAM: 340 ng/mL — AB (ref <25)
ALPHAHYDROXYMIDAZOLAM: NEGATIVE ng/mL (ref <50)
AMINOCLONAZEPAM: NEGATIVE ng/mL (ref <25)
AMPHETAMINES: NEGATIVE ng/mL (ref <500)
Alphahydroxytriazolam: NEGATIVE ng/mL (ref <50)
BENZODIAZEPINES: POSITIVE ng/mL — AB (ref <100)
BUPRENORPHINE, URINE: NEGATIVE ng/mL (ref <5)
CREATININE: 144.2 mg/dL
Cocaine Metabolite: NEGATIVE ng/mL (ref <150)
Codeine: NEGATIVE ng/mL (ref <50)
HYDROCODONE: 2824 ng/mL — AB (ref <50)
HYDROXYETHYLFLURAZEPAM: NEGATIVE ng/mL (ref <50)
Hydromorphone: 1338 ng/mL — ABNORMAL HIGH (ref <50)
Lorazepam: NEGATIVE ng/mL (ref <50)
MDMA: NEGATIVE ng/mL (ref <500)
MORPHINE: NEGATIVE ng/mL (ref <50)
Marijuana Metabolite: NEGATIVE ng/mL (ref <20)
Nordiazepam: NEGATIVE ng/mL (ref <50)
Norhydrocodone: 8697 ng/mL — ABNORMAL HIGH (ref <50)
OXIDANT: NEGATIVE ug/mL (ref <200)
Opiates: POSITIVE ng/mL — AB (ref <100)
Oxazepam: NEGATIVE ng/mL (ref <50)
Oxycodone: NEGATIVE ng/mL (ref <100)
PH: 6.06 (ref 4.5–9.0)
TEMAZEPAM: NEGATIVE ng/mL (ref <50)

## 2018-05-15 ENCOUNTER — Other Ambulatory Visit: Payer: Self-pay | Admitting: Internal Medicine

## 2018-05-16 ENCOUNTER — Encounter: Payer: Medicare Other | Admitting: Internal Medicine

## 2018-05-22 ENCOUNTER — Telehealth: Payer: Self-pay

## 2018-05-22 NOTE — Telephone Encounter (Signed)
Author phoned pt. to assess interest in scheduling virtual awv. No answer, author left detailed VM asking for return call regarding preference of doing virtual awv with PCP or health coach.

## 2018-05-27 ENCOUNTER — Telehealth: Payer: Self-pay | Admitting: Internal Medicine

## 2018-05-27 DIAGNOSIS — M48062 Spinal stenosis, lumbar region with neurogenic claudication: Secondary | ICD-10-CM

## 2018-05-27 DIAGNOSIS — F419 Anxiety disorder, unspecified: Secondary | ICD-10-CM

## 2018-05-29 ENCOUNTER — Other Ambulatory Visit: Payer: Self-pay | Admitting: Internal Medicine

## 2018-05-29 DIAGNOSIS — F419 Anxiety disorder, unspecified: Secondary | ICD-10-CM

## 2018-05-29 DIAGNOSIS — M48062 Spinal stenosis, lumbar region with neurogenic claudication: Secondary | ICD-10-CM

## 2018-05-29 NOTE — Telephone Encounter (Signed)
Has appointment 05/30/2018

## 2018-05-30 ENCOUNTER — Ambulatory Visit (INDEPENDENT_AMBULATORY_CARE_PROVIDER_SITE_OTHER): Payer: Medicare Other | Admitting: Internal Medicine

## 2018-05-30 ENCOUNTER — Other Ambulatory Visit: Payer: Self-pay | Admitting: Internal Medicine

## 2018-05-30 ENCOUNTER — Other Ambulatory Visit: Payer: Self-pay

## 2018-05-30 DIAGNOSIS — M48062 Spinal stenosis, lumbar region with neurogenic claudication: Secondary | ICD-10-CM

## 2018-05-30 DIAGNOSIS — F419 Anxiety disorder, unspecified: Secondary | ICD-10-CM

## 2018-05-30 MED ORDER — HYDROCODONE-ACETAMINOPHEN 7.5-325 MG PO TABS: 1.0000 | ORAL_TABLET | Freq: Four times a day (QID) | ORAL | 0 refills | Status: DC | PRN

## 2018-05-30 MED ORDER — ALPRAZOLAM 0.5 MG PO TABS: 0.5000 mg | ORAL_TABLET | Freq: Every evening | ORAL | 2 refills | Status: DC | PRN

## 2018-05-30 NOTE — Telephone Encounter (Signed)
Okay to send to Mail order?

## 2018-05-30 NOTE — Telephone Encounter (Signed)
Ok to send to [facility], but we need to cancel Rx sent to mail order

## 2018-05-30 NOTE — Telephone Encounter (Signed)
Copied from CRM [id number]. Topic: Quick Communication - Rx Refill/Question >> May 30, 2018 11:10 AM Wyonia Hough E wrote: Medication: HYDROcodone-acetaminophen (NORCO) 7.5-325 MG tablet - [facility] of [facility]  Has the patient contacted their pharmacy? Yes-   Preferred Pharmacy (with phone number or street name): [facility] [address] [place] - [address] [phone] (Phone) [phone] (Fax)    Agent: Please be advised that RX refills may take up to 3 business days. We ask that you follow-up with your pharmacy.

## 2018-05-30 NOTE — Telephone Encounter (Signed)
Hydrocodne refills have been cancelled and sent to [facility]

## 2018-05-30 NOTE — Addendum Note (Signed)
Addended by: Kern Reap B on: 05/30/2018 04:12 PM   Modules accepted: Orders

## 2018-05-30 NOTE — Progress Notes (Signed)
Virtual Visit via Video Note  I connected with Katie Booker on 05/30/18 at  8:45 AM EDT by a video enabled telemedicine application and verified that I am speaking with the correct person using two identifiers.  Location patient: home Location provider: work office Persons participating in the virtual visit: patient, provider  I discussed the limitations of evaluation and management by telemedicine and the availability of in person appointments. The patient expressed understanding and agreed to proceed.   HPI: She needs refills of her hydrocodone and xanax. Her CPE is schedule for June. Past Medical History is significant for: Spinal stenosis for which she takes hydrocodone, anxiety for which she takes Ativan and trazodone that she uses at bedtime for sleeping purposes.  She is coming in today for every 3 month visit for medication refills. Since I last saw her, she went to see ortho, Dr. Dion SaucierLandau due to right hip pain and received a cortisone injection that has improved. She has had increased back pain that is not completely controlled with hydrocodone and wants a referral back to see her neurosurgeon, Dr. Danielle DessElsner.  ROS: Constitutional: Denies fever, chills, diaphoresis, appetite change and fatigue.  HEENT: Denies photophobia, eye pain, redness, hearing loss, ear pain, congestion, sore throat, rhinorrhea, sneezing, mouth sores, trouble swallowing, neck pain, neck stiffness and tinnitus.   Respiratory: Denies SOB, DOE, cough, chest tightness,  and wheezing.   Cardiovascular: Denies chest pain, palpitations and leg swelling.  Gastrointestinal: Denies nausea, vomiting, abdominal pain, diarrhea, constipation, blood in stool and abdominal distention.  Genitourinary: Denies dysuria, urgency, frequency, hematuria, flank pain and difficulty urinating.  Endocrine: Denies: hot or cold intolerance, sweats, changes in hair or nails, polyuria, polydipsia. Musculoskeletal: Denies myalgias, back  pain, joint swelling, arthralgias and gait problem.  Skin: Denies pallor, rash and wound.  Neurological: Denies dizziness, seizures, syncope, weakness, light-headedness, numbness and headaches.  Hematological: Denies adenopathy. Easy bruising, personal or family bleeding history  Psychiatric/Behavioral: Denies suicidal ideation, mood changes, confusion, nervousness, sleep disturbance and agitation   Past Medical History:  Diagnosis Date  . Age-related macular degeneration, wet, right eye (HCC)    per pt has had treatment in past  . Anxiety   . Chronic constipation   . Chronic low back pain   . Chronic neck pain   . 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 Depression   . DOE (dyspnea on exertion)   . GERD   . Headache   . Hemorrhoids   . History of basal cell carcinoma excision    left cheek  . History of colon polyps   . History of hyperthyroidism 2011   RAI treatement  . History of panic attacks   . Hyperlipidemia   . Hypertension   . Hypothyroidism, postradioiodine therapy    followed by pcp  . IDA (iron deficiency anemia)    intermittant  . Insomnia   . OA (osteoarthritis)   . OSA (obstructive sleep apnea)    no cpap, per pt tried unable to tolerate  . Peripheral neuropathy    legs and feet from hx back surgery's  . Rash    bilateral axilla area -- per pt due to some personal wipes used other the counter  . RLS (restless legs syndrome)   . Spondylolisthesis at L1-L2 level   . Tingling of both upper extremities    left greater than right due to cervical pinched nerve  . Type  2 diabetes mellitus (HCC)    followed by pcp  . Umbilical hernia   . Urinary frequency   . Urinary urgency     Past Surgical History:  Procedure Laterality Date  . ANTERIOR CERVICAL DECOMP/DISCECTOMY FUSION N/A 09/02/2014   Procedure: Cervical five- six  Anterior cervical decompression fusion;  Surgeon: Barnett Abu, MD;  Location: [facility];  Service: Neurosurgery;  Laterality: N/A;  C5-6 Anterior cervical decompression/diskectomy/fusion  . CARPAL TUNNEL RELEASE Right 2016  . CATARACT EXTRACTION W/ INTRAOCULAR LENS  IMPLANT, BILATERAL  03/2015  . COLONOSCOPY  2007  . ESOPHAGOGASTRODUODENOSCOPY  2007  . HEMORRHOID SURGERY  03/23/2011   Procedure: HEMORRHOIDECTOMY;  Surgeon: Clovis Pu. Cornett, MD;  Location: [facility];  Service: General;  Laterality: N/A;  lateral internal sphincterotomy and hemorrhoidectomy  . LUMBAR FUSION  02/2015   L1 -- 2  . PARTIAL KNEE ARTHROPLASTY Left 12/27/2017   Procedure: UNICOMPARTMENTAL LEFT KNEE;  Surgeon: Teryl Lucy, MD;  Location: [facility];  Service: Orthopedics;  Laterality: Left;  . THORACIC DISCECTOMY N/A 12/15/2012   Procedure: Thoracic ten-eleven Thoracic laminectomy;  Surgeon: Barnett Abu, MD;  Location: [facility];  Service: Neurosurgery;  Laterality: N/A;  Thoracic ten-eleven Thoracic laminectomy  . TUBAL LIGATION Bilateral 1991    Family History  Problem Relation Age of Onset  . Diabetes Father   . COPD Father   . Cancer Maternal Grandfather        stomach    SOCIAL HX:   reports that she quit smoking about 14 years ago. Her smoking use included cigarettes. She has a 20.00 pack-year smoking history. She has never used smokeless tobacco. She reports current alcohol use. She reports that she does not use drugs.   Current Outpatient Medications:  .  ALPRAZolam (XANAX) 0.5 MG tablet, Take 1 tablet (0.5 mg total) by mouth at bedtime as needed for anxiety., Disp: 30 tablet, Rfl: 2 .  atenolol (TENORMIN) 25 MG tablet, TAKE 1 TABLET BY MOUTH  DAILY, Disp: 90 tablet, Rfl: 1 .  atorvastatin (LIPITOR) 40 MG tablet, Take 1 tablet (40 mg total) by mouth daily., Disp: 90 tablet, Rfl: 1 .  Calcium Carb-Cholecalciferol (CALCIUM 600/VITAMIN D3 PO), Take 1 tablet by mouth 2 (two) times daily., Disp: , Rfl:  .  Carboxymethylcellulose Sodium (LUBRICANT EYE DROPS OP), Apply 2  drops to eye 4 (four) times daily as needed (dry eyes). , Disp: , Rfl:  .  citalopram (CELEXA) 40 MG tablet, Take 1 tablet (40 mg total) by mouth daily., Disp: 90 tablet, Rfl: 1 .  docusate sodium (COLACE) 100 MG capsule, Take 200 mg by mouth at bedtime. , Disp: , Rfl:  .  estradiol (ESTRACE) 0.1 MG/GM vaginal cream, Place 1 Applicatorful vaginally 3 (three) times a week., Disp: 42.5 g, Rfl: 12 .  gemfibrozil (LOPID) 600 MG tablet, TAKE 1 TABLET BY MOUTH TWO&nbsp;&nbsp;TIMES DAILY BEFORE MEALS, Disp: 180 tablet, Rfl: 1 .  HYDROcodone-acetaminophen (NORCO) 7.5-325 MG tablet, Take 1 tablet by mouth every 6 (six) hours as needed for moderate pain. FILL IN TWO MONTHS, Disp: 120 tablet, Rfl: 0 .  HYDROcodone-acetaminophen (NORCO) 7.5-325 MG tablet, Take 1 tablet by mouth every 6 (six) hours as needed for moderate pain., Disp: 120 tablet, Rfl: 0 .  HYDROcodone-acetaminophen (NORCO) 7.5-325 MG tablet, Take 1 tablet by mouth every 6 (six) hours as needed for moderate pain., Disp: 120 tablet, Rfl: 0 .  hydrocortisone 2.5 % cream, APPLY 1 APPLICATION  TOPICALLY 2  TIMES DAILY. (  Patient taking differently: Apply 1 application topically 2 (two) times daily as needed (irritation). APPLY 1 APPLICATION  TOPICALLY 2  TIMES DAILY.), Disp: 84 g, Rfl: 0 .  Lancets (ONETOUCH ULTRASOFT) lancets, Use daily, Disp: 100 each, Rfl: 6 .  levothyroxine (SYNTHROID, LEVOTHROID) 175 MCG tablet, TAKE 1 TABLET BY MOUTH  DAILY BEFORE BREAKFAST (Patient taking differently: Take 175 mcg by mouth daily before breakfast. ), Disp: 90 tablet, Rfl: 5 .  lisinopril (PRINIVIL,ZESTRIL) 10 MG tablet, Take 1 tablet (10 mg total) by mouth daily., Disp: 90 tablet, Rfl: 1 .  loratadine (CLARITIN) 10 MG tablet, Take 10 mg by mouth daily as needed for allergies., Disp: , Rfl:  .  metFORMIN (GLUCOPHAGE) 1000 MG tablet, Take 1 tablet (1,000 mg total) by mouth 2 (two) times daily with a meal., Disp: 180 tablet, Rfl: 1 .  Multiple Vitamin (MULTIVITAMIN)  tablet, Take 1 tablet by mouth daily., Disp: , Rfl:  .  Omega-3 Fatty Acids (FISH OIL) 1200 MG CAPS, Take 1 capsule by mouth daily. , Disp: , Rfl:  .  ONE TOUCH ULTRA TEST test strip, USE 1 DAILY AS DIRECTED, Disp: 100 each, Rfl: 3 .  pantoprazole (PROTONIX) 40 MG tablet, Take 1 tablet (40 mg total) by mouth 2 (two) times daily before a meal., Disp: 180 tablet, Rfl: 3 .  pioglitazone (ACTOS) 15 MG tablet, Take 1 tablet (15 mg total) by mouth daily., Disp: 90 tablet, Rfl: 1 .  pregabalin (LYRICA) 75 MG capsule, Take 1 capsule (75 mg total) by mouth daily., Disp: 90 capsule, Rfl: 1 .  rOPINIRole (REQUIP) 1 MG tablet, Take 1 tablet (1 mg total) by mouth at bedtime., Disp: 90 tablet, Rfl: 4 .  sennosides-docusate sodium (SENOKOT-S) 8.6-50 MG tablet, Take 2 tablets by mouth daily., Disp: 30 tablet, Rfl: 1 .  tiZANidine (ZANAFLEX) 4 MG capsule, Take 1 capsule (4 mg total) by mouth 3 (three) times daily., Disp: 180 capsule, Rfl: 1 .  tolnaftate (TINACTIN) 1 % spray, Apply topically 2 (two) times daily as needed., Disp: 130 g, Rfl: 0 .  traZODone (DESYREL) 100 MG tablet, Take 1 tablet (100 mg total) by mouth at bedtime., Disp: 90 tablet, Rfl: 1  EXAM:   VITALS per patient if applicable: none reported  GENERAL: alert, oriented, appears well and in no acute distress  HEENT: atraumatic, conjunttiva clear, no obvious abnormalities on inspection of external nose and ears  NECK: normal movements of the head and neck  LUNGS: on inspection no signs of respiratory distress, breathing rate appears normal, no obvious gross increased work of breathing, gasping or wheezing  CV: no obvious cyanosis  MS: moves all visible extremities without noticeable abnormality  PSYCH/NEURO: pleasant and cooperative, no obvious depression or anxiety, speech and thought processing grossly intact  ASSESSMENT AND PLAN:   Anxiety  -She takes it at bedtime for sleep. -Will send in 3 month refills today.  Spinal stenosis,  lumbar region, with neurogenic claudication  -Per patient request, will send back to see neurosurgery.  NCCSRS database printed, initialed  and scanned into the EMR.   Indication for chronic opioid: spinal stenosis with chronic back pain Medication and dose: Hydrocodone 7.5 mg q 6 hrs PRN # pills per month: 120 Last UDS date: not on file Opioid Treatment Agreement signed: yes Opioid Treatment Agreement last reviewed with patient: 01/13/2018 NCCSRS reviewed this encounter (include red flags):  yes, no red flags, OD risk score 500 (presumably due to combination of meds, not due to multiple prescribers)  I discussed the assessment and treatment plan with the patient. The patient was provided an opportunity to ask questions and all were answered. The patient agreed with the plan and demonstrated an understanding of the instructions.   The patient was advised to call back or seek an in-person evaluation if the symptoms worsen or if the condition fails to improve as anticipated.    Lelon Frohlich, MD  [facility]

## 2018-05-31 NOTE — Telephone Encounter (Signed)
[facility] called into [facility] and cancelled mail order.

## 2018-06-06 ENCOUNTER — Other Ambulatory Visit: Payer: Self-pay | Admitting: Internal Medicine

## 2018-06-17 ENCOUNTER — Other Ambulatory Visit: Payer: Self-pay | Admitting: Internal Medicine

## 2018-06-17 DIAGNOSIS — G4700 Insomnia, unspecified: Secondary | ICD-10-CM

## 2018-06-20 ENCOUNTER — Other Ambulatory Visit: Payer: Self-pay | Admitting: Internal Medicine

## 2018-06-21 ENCOUNTER — Other Ambulatory Visit: Payer: Self-pay | Admitting: Internal Medicine

## 2018-06-21 DIAGNOSIS — F419 Anxiety disorder, unspecified: Secondary | ICD-10-CM

## 2018-06-21 NOTE — Telephone Encounter (Signed)
Copied from CRM [id number]. Topic: Quick Communication - Rx Refill/Question >> Jun 21, 2018  4:48 PM Wyonia Hough E wrote: Medication: ALPRAZolam Prudy Feeler) 0.5 MG tablet - Pt states she was prescribed the dose to take 2 daily and on 4.8.20 the dose was changed without her knowledge to 1 daily. Pt was not aware and has continued to take two a day and now only has 1 pill left and is due to refill until 5.9.20/ Pt would like the dose changed back to 2 daily and a call from rachel / please advise

## 2018-06-23 MED ORDER — ALPRAZOLAM 0.5 MG PO TABS: 0.5000 mg | ORAL_TABLET | Freq: Two times a day (BID) | ORAL | 1 refills | Status: DC | PRN

## 2018-06-23 NOTE — Telephone Encounter (Signed)
Let's investigate this one

## 2018-06-26 ENCOUNTER — Other Ambulatory Visit: Payer: Self-pay | Admitting: Internal Medicine

## 2018-06-26 NOTE — Telephone Encounter (Signed)
Okay to refill Lyrica

## 2018-06-27 ENCOUNTER — Other Ambulatory Visit: Payer: Self-pay | Admitting: *Deleted

## 2018-06-27 MED ORDER — HYDROCORTISONE 2.5 % EX CREA: TOPICAL_CREAM | CUTANEOUS | 0 refills | Status: DC

## 2018-06-30 ENCOUNTER — Other Ambulatory Visit: Payer: Self-pay | Admitting: *Deleted

## 2018-06-30 MED ORDER — LEVOTHYROXINE SODIUM 175 MCG PO TABS: ORAL_TABLET | ORAL | 0 refills | Status: DC

## 2018-07-18 ENCOUNTER — Other Ambulatory Visit: Payer: Self-pay | Admitting: *Deleted

## 2018-07-18 ENCOUNTER — Other Ambulatory Visit: Payer: Self-pay | Admitting: Internal Medicine

## 2018-07-18 ENCOUNTER — Telehealth: Payer: Self-pay | Admitting: Internal Medicine

## 2018-07-18 DIAGNOSIS — F419 Anxiety disorder, unspecified: Secondary | ICD-10-CM

## 2018-07-18 MED ORDER — MELOXICAM 15 MG PO TABS: 15.0000 mg | ORAL_TABLET | Freq: Every day | ORAL | 1 refills | Status: DC

## 2018-07-18 NOTE — Telephone Encounter (Signed)
Copied from CRM [id number]. Topic: Quick Communication - Rx Refill/Question >> Jul 18, 2018 11:42 AM Baldo Daub L wrote: Medication: Melixocam  Has the patient contacted their pharmacy? Yes - states it is being missed by Dr. Ardyth Harps (Agent: If no, request that the patient contact the pharmacy for the refill.) (Agent: If yes, when and what did the pharmacy advise?)  Preferred Pharmacy (with phone number or street name): [facility] - [place], [place] - [address] [phone] (Phone) [phone] (Fax)  Agent: Please be advised that RX refills may take up to 3 business days. We ask that you follow-up with your pharmacy.

## 2018-07-19 ENCOUNTER — Other Ambulatory Visit: Payer: Self-pay | Admitting: Neurological Surgery

## 2018-07-19 DIAGNOSIS — M79604 Pain in right leg: Secondary | ICD-10-CM

## 2018-07-28 ENCOUNTER — Telehealth: Payer: Self-pay | Admitting: Internal Medicine

## 2018-07-28 NOTE — Telephone Encounter (Unsigned)
Copied from CRM [id number]. Topic: Quick Communication - Rx Refill/Question >> Jul 28, 2018 12:22 PM Doreatha Massed wrote: Medication: pregabalin (LYRICA) 75 MG capsule  Has the patient contacted their pharmacy? Yes (Agent: If no, request that the patient contact the pharmacy for the refill.) (Agent: If yes, when and what did the pharmacy advise? Pharmacy stated office didn't reply  Preferred Pharmacy (with phone number or street name): [facility]  Agent: Please be advised that RX refills may take up to 3 business days. We ask that you follow-up with your pharmacy.

## 2018-07-31 ENCOUNTER — Other Ambulatory Visit: Payer: Self-pay | Admitting: *Deleted

## 2018-07-31 DIAGNOSIS — M48062 Spinal stenosis, lumbar region with neurogenic claudication: Secondary | ICD-10-CM

## 2018-07-31 NOTE — Telephone Encounter (Signed)
Clinic RN spoke with patient. Refill was sent on 06/26/2018 but patient reports she did not receive it. RN reviewed transmission and the transmission did fail to go through to patient pharmacy. Please send another rx to [facility]

## 2018-07-31 NOTE — Telephone Encounter (Signed)
Pt called to f/u on pregabalin - she is now completely out of medication since May 23.Katie Booker Pt is upset and doesn't understand why this has happened. I apologized to pt that the order was put in on 5/4, the day it was requested, but the transmission failed. Please send new RX.   Please send to [facility] (no charge for pt)

## 2018-08-01 ENCOUNTER — Other Ambulatory Visit: Payer: Self-pay | Admitting: Internal Medicine

## 2018-08-01 DIAGNOSIS — M549 Dorsalgia, unspecified: Secondary | ICD-10-CM

## 2018-08-01 DIAGNOSIS — G8929 Other chronic pain: Secondary | ICD-10-CM

## 2018-08-01 MED ORDER — PREGABALIN 75 MG PO CAPS: 75.0000 mg | ORAL_CAPSULE | Freq: Every day | ORAL | 0 refills | Status: DC

## 2018-08-01 NOTE — Telephone Encounter (Signed)
Rx sent 

## 2018-08-12 ENCOUNTER — Other Ambulatory Visit: Payer: Self-pay | Admitting: Internal Medicine

## 2018-08-12 DIAGNOSIS — F419 Anxiety disorder, unspecified: Secondary | ICD-10-CM

## 2018-08-12 DIAGNOSIS — M48062 Spinal stenosis, lumbar region with neurogenic claudication: Secondary | ICD-10-CM

## 2018-08-15 ENCOUNTER — Other Ambulatory Visit: Payer: Self-pay

## 2018-08-15 ENCOUNTER — Other Ambulatory Visit: Payer: Self-pay | Admitting: Internal Medicine

## 2018-08-15 ENCOUNTER — Encounter: Payer: Self-pay | Admitting: Internal Medicine

## 2018-08-15 ENCOUNTER — Ambulatory Visit (INDEPENDENT_AMBULATORY_CARE_PROVIDER_SITE_OTHER): Payer: Medicare Other | Admitting: Internal Medicine

## 2018-08-15 ENCOUNTER — Encounter: Payer: Medicare Other | Admitting: Internal Medicine

## 2018-08-15 VITALS — BP 110/68 | HR 83 | Temp 98.9°F | Ht 60.75 in | Wt 197.2 lb

## 2018-08-15 DIAGNOSIS — I10 Essential (primary) hypertension: Secondary | ICD-10-CM

## 2018-08-15 DIAGNOSIS — Z1239 Encounter for other screening for malignant neoplasm of breast: Secondary | ICD-10-CM

## 2018-08-15 DIAGNOSIS — G4733 Obstructive sleep apnea (adult) (pediatric): Secondary | ICD-10-CM

## 2018-08-15 DIAGNOSIS — K219 Gastro-esophageal reflux disease without esophagitis: Secondary | ICD-10-CM

## 2018-08-15 DIAGNOSIS — E119 Type 2 diabetes mellitus without complications: Secondary | ICD-10-CM | POA: Diagnosis not present

## 2018-08-15 DIAGNOSIS — Z0000 Encounter for general adult medical examination without abnormal findings: Secondary | ICD-10-CM

## 2018-08-15 DIAGNOSIS — E039 Hypothyroidism, unspecified: Secondary | ICD-10-CM

## 2018-08-15 DIAGNOSIS — N814 Uterovaginal prolapse, unspecified: Secondary | ICD-10-CM

## 2018-08-15 DIAGNOSIS — M48062 Spinal stenosis, lumbar region with neurogenic claudication: Secondary | ICD-10-CM

## 2018-08-15 DIAGNOSIS — Z1211 Encounter for screening for malignant neoplasm of colon: Secondary | ICD-10-CM

## 2018-08-15 DIAGNOSIS — F419 Anxiety disorder, unspecified: Secondary | ICD-10-CM

## 2018-08-15 DIAGNOSIS — M4710 Other spondylosis with myelopathy, site unspecified: Secondary | ICD-10-CM

## 2018-08-15 LAB — CBC WITH DIFFERENTIAL/PLATELET
Basophils Absolute: 0 10*3/uL (ref 0.0–0.1)
Basophils Relative: 0.4 % (ref 0.0–3.0)
Eosinophils Absolute: 0.6 10*3/uL (ref 0.0–0.7)
Eosinophils Relative: 5.6 % — ABNORMAL HIGH (ref 0.0–5.0)
HCT: 36.8 % (ref 36.0–46.0)
Hemoglobin: 12.4 g/dL (ref 12.0–15.0)
Lymphocytes Relative: 20.3 % (ref 12.0–46.0)
Lymphs Abs: 2.1 10*3/uL (ref 0.7–4.0)
MCHC: 33.6 g/dL (ref 30.0–36.0)
MCV: 85 fl (ref 78.0–100.0)
Monocytes Absolute: 0.6 10*3/uL (ref 0.1–1.0)
Monocytes Relative: 5.8 % (ref 3.0–12.0)
Neutro Abs: 7.1 10*3/uL (ref 1.4–7.7)
Neutrophils Relative %: 67.9 % (ref 43.0–77.0)
Platelets: 202 10*3/uL (ref 150.0–400.0)
RBC: 4.33 Mil/uL (ref 3.87–5.11)
RDW: 13.1 % (ref 11.5–15.5)
WBC: 10.5 10*3/uL (ref 4.0–10.5)

## 2018-08-15 LAB — COMPREHENSIVE METABOLIC PANEL
ALT: 19 U/L (ref 0–35)
AST: 16 U/L (ref 0–37)
Albumin: 4.3 g/dL (ref 3.5–5.2)
Alkaline Phosphatase: 69 U/L (ref 39–117)
BUN: 27 mg/dL — ABNORMAL HIGH (ref 6–23)
CO2: 27 mEq/L (ref 19–32)
Calcium: 9.4 mg/dL (ref 8.4–10.5)
Chloride: 101 mEq/L (ref 96–112)
Creatinine, Ser: 0.89 mg/dL (ref 0.40–1.20)
GFR: 62.27 mL/min (ref >60.00)
Glucose, Bld: 91 mg/dL (ref 70–99)
Potassium: 4.8 mEq/L (ref 3.5–5.1)
Sodium: 138 mEq/L (ref 135–145)
Total Bilirubin: 0.3 mg/dL (ref 0.2–1.2)
Total Protein: 6.3 g/dL (ref 6.0–8.3)

## 2018-08-15 LAB — VITAMIN B12: Vitamin B-12: 477 pg/mL (ref 211–911)

## 2018-08-15 LAB — VITAMIN D 25 HYDROXY (VIT D DEFICIENCY, FRACTURES): VITD: 63.02 ng/mL (ref 30.00–100.00)

## 2018-08-15 LAB — LIPID PANEL
Cholesterol: 154 mg/dL (ref 0–200)
HDL: 50.7 mg/dL (ref >39.00)
NonHDL: 103.09
Total CHOL/HDL Ratio: 3
Triglycerides: 249 mg/dL — ABNORMAL HIGH (ref 0.0–149.0)
VLDL: 49.8 mg/dL — ABNORMAL HIGH (ref 0.0–40.0)

## 2018-08-15 LAB — TSH: TSH: 0.1 u[IU]/mL — ABNORMAL LOW (ref 0.35–4.50)

## 2018-08-15 LAB — LDL CHOLESTEROL, DIRECT: Direct LDL: 70 mg/dL

## 2018-08-15 LAB — HEMOGLOBIN A1C: Hgb A1c MFr Bld: 6.2 % (ref 4.6–6.5)

## 2018-08-15 MED ORDER — HYDROCODONE-ACETAMINOPHEN 7.5-325 MG PO TABS: 1.0000 | ORAL_TABLET | Freq: Four times a day (QID) | ORAL | 0 refills | Status: DC | PRN

## 2018-08-15 MED ORDER — ALPRAZOLAM 0.5 MG PO TABS: 0.5000 mg | ORAL_TABLET | Freq: Two times a day (BID) | ORAL | 2 refills | Status: DC | PRN

## 2018-08-15 MED ORDER — LEVOTHYROXINE SODIUM 150 MCG PO TABS: 150.0000 ug | ORAL_TABLET | Freq: Every day | ORAL | 1 refills | Status: DC

## 2018-08-15 NOTE — Progress Notes (Signed)
Established Patient Office Visit     CC/Reason for Visit: Annual preventive exam and subsequent Medicare wellness visit  HPI: Katie Booker is a 73 y.o. female who is coming in today for the above mentioned reasons. Past Medical History is significant for:  Spinal stenosis of cervical, thoracic, lumbar spine followed by Dr. Ellene Route and on chronic hydrocodone previously prescribed by Dr. Raliegh Ip, anxiety on Xanax, insomnia on trazodone, diabetes, hypertension, hyperlipidemia, hypothyroidism.  She also had a left knee replacement in November 2019.  She complains of a uterine prolapse today and would like GYN referral.  She also had an MRI of the lumbar spine yesterday and has follow-up with her neurosurgeon, Dr. Ellene Route tomorrow.  Past Medical/Surgical History: Past Medical History:  Diagnosis Date  . Age-related macular degeneration, wet, right eye ([place])    per pt has had treatment in past  . Anxiety   . Chronic constipation   . Chronic low back pain   . Chronic neck pain   . 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 Depression   . DOE (dyspnea on exertion)   . GERD   . Headache   . Hemorrhoids   . History of basal cell carcinoma excision    left cheek  . History of colon polyps   . History of hyperthyroidism 2011   RAI treatement  . History of panic attacks   . Hyperlipidemia   . Hypertension   . Hypothyroidism, postradioiodine therapy    followed by pcp  . IDA (iron deficiency anemia)    intermittant  . Insomnia   . OA (osteoarthritis)   . OSA (obstructive sleep apnea)    no cpap, per pt tried unable to tolerate  . Peripheral neuropathy    legs and feet from hx back surgery's  . Rash    bilateral axilla area -- per pt due to some personal wipes used other the counter  . RLS (restless legs syndrome)   . Spondylolisthesis at L1-L2 level   . Tingling of both upper extremities    left greater than right due to cervical  pinched nerve  . Type 2 diabetes mellitus ([place])    followed by pcp  . Umbilical hernia   . Urinary frequency   . Urinary urgency     Past Surgical History:  Procedure Laterality Date  . ANTERIOR CERVICAL DECOMP/DISCECTOMY FUSION N/A 09/02/2014   Procedure: Cervical five- six  Anterior cervical decompression fusion;  Surgeon: Kristeen Miss, MD;  Location: [facility];  Service: Neurosurgery;  Laterality: N/A;  C5-6 Anterior cervical decompression/diskectomy/fusion  . CARPAL TUNNEL RELEASE Right 2016  . CATARACT EXTRACTION W/ INTRAOCULAR LENS  IMPLANT, BILATERAL  03/2015  . COLONOSCOPY  2007  . ESOPHAGOGASTRODUODENOSCOPY  2007  . HEMORRHOID SURGERY  03/23/2011   Procedure: HEMORRHOIDECTOMY;  Surgeon: Joyice Faster. Cornett, MD;  Location: [facility];  Service: General;  Laterality: N/A;  lateral internal sphincterotomy and hemorrhoidectomy  . LUMBAR FUSION  02/2015   L1 -- 2  . PARTIAL KNEE ARTHROPLASTY Left 12/27/2017   Procedure: UNICOMPARTMENTAL LEFT KNEE;  Surgeon: Marchia Bond, MD;  Location: [facility];  Service: Orthopedics;  Laterality: Left;  . THORACIC DISCECTOMY N/A 12/15/2012   Procedure: Thoracic ten-eleven Thoracic laminectomy;  Surgeon: Kristeen Miss, MD;  Location: [facility];  Service: Neurosurgery;  Laterality: N/A;  Thoracic ten-eleven Thoracic laminectomy  . TUBAL LIGATION Bilateral 1991    Social  History:  reports that she quit smoking about 14 years ago. Her smoking use included cigarettes. She has a 20.00 pack-year smoking history. She has never used smokeless tobacco. She reports current alcohol use. She reports that she does not use drugs.  Allergies: Allergies  Allergen Reactions  . Duraprep [employer], Misc.] Itching and Rash    Irritation everywhere prep was used  . Betadine [Povidone Iodine] Other (See Comments)    Burning sensation.   . Hydromorphone Hcl Other (See Comments)    Makes crazy  . Morphine And Related Other (See Comments)     hallucinations    Family History:  Family History  Problem Relation Age of Onset  . Diabetes Father   . COPD Father   . Cancer Maternal Grandfather        stomach     Current Outpatient Medications:  .  ALPRAZolam (XANAX) 0.5 MG tablet, Take 1 tablet (0.5 mg total) by mouth 2 (two) times daily as needed for anxiety., Disp: 60 tablet, Rfl: 2 .  atenolol (TENORMIN) 25 MG tablet, TAKE 1 TABLET BY MOUTH  DAILY, Disp: 90 tablet, Rfl: 1 .  atorvastatin (LIPITOR) 40 MG tablet, TAKE 1 TABLET BY MOUTH  DAILY, Disp: 90 tablet, Rfl: 1 .  Calcium Carb-Cholecalciferol (CALCIUM 600/VITAMIN D3 PO), Take 1 tablet by mouth 2 (two) times daily., Disp: , Rfl:  .  Carboxymethylcellulose Sodium (LUBRICANT EYE DROPS OP), Apply 2 drops to eye 4 (four) times daily as needed (dry eyes). , Disp: , Rfl:  .  citalopram (CELEXA) 40 MG tablet, TAKE 1 TABLET BY MOUTH  DAILY, Disp: 90 tablet, Rfl: 1 .  docusate sodium (COLACE) 100 MG capsule, Take 200 mg by mouth at bedtime. , Disp: , Rfl:  .  estradiol (ESTRACE) 0.1 MG/GM vaginal cream, Place 1 Applicatorful vaginally 3 (three) times a week., Disp: 42.5 g, Rfl: 12 .  gemfibrozil (LOPID) 600 MG tablet, TAKE 1 TABLET BY MOUTH TWO  TIMES DAILY BEFORE MEALS, Disp: 180 tablet, Rfl: 1 .  HYDROcodone-acetaminophen (NORCO) 7.5-325 MG tablet, Take 1 tablet by mouth every 6 (six) hours as needed for moderate pain., Disp: 120 tablet, Rfl: 0 .  HYDROcodone-acetaminophen (NORCO) 7.5-325 MG tablet, Take 1 tablet by mouth every 6 (six) hours as needed for moderate pain., Disp: 120 tablet, Rfl: 0 .  HYDROcodone-acetaminophen (NORCO) 7.5-325 MG tablet, Take 1 tablet by mouth every 6 (six) hours as needed for moderate pain., Disp: 120 tablet, Rfl: 0 .  hydrocortisone 2.5 % cream, APPLY 1 APPLICATION  TOPICALLY 2  TIMES DAILY., Disp: 84 g, Rfl: 0 .  Lancets (ONETOUCH ULTRASOFT) lancets, Use daily, Disp: 100 each, Rfl: 6 .  levothyroxine (SYNTHROID) 175 MCG tablet, TAKE 1 TABLET BY  MOUTH&nbsp;&nbsp;DAILY BEFORE BREAKFAST, Disp: 90 tablet, Rfl: 0 .  lisinopril (ZESTRIL) 10 MG tablet, TAKE 1 TABLET BY MOUTH  DAILY, Disp: 90 tablet, Rfl: 1 .  loratadine (CLARITIN) 10 MG tablet, Take 10 mg by mouth daily as needed for allergies., Disp: , Rfl:  .  meloxicam (MOBIC) 15 MG tablet, Take 1 tablet (15 mg total) by mouth daily., Disp: 90 tablet, Rfl: 1 .  metFORMIN (GLUCOPHAGE) 1000 MG tablet, TAKE 1 TABLET BY MOUTH TWO  TIMES DAILY WITH A MEAL, Disp: 180 tablet, Rfl: 1 .  Multiple Vitamin (MULTIVITAMIN) tablet, Take 1 tablet by mouth daily., Disp: , Rfl:  .  Omega-3 Fatty Acids (FISH OIL) 1200 MG CAPS, Take 1 capsule by mouth daily. , Disp: , Rfl:  .  ONE TOUCH ULTRA TEST test strip, USE 1 DAILY AS DIRECTED, Disp: 100 each, Rfl: 3 .  pantoprazole (PROTONIX) 40 MG tablet, Take 1 tablet (40 mg total) by mouth 2 (two) times daily before a meal., Disp: 180 tablet, Rfl: 3 .  pioglitazone (ACTOS) 15 MG tablet, TAKE 1 TABLET BY MOUTH  DAILY, Disp: 90 tablet, Rfl: 1 .  pregabalin (LYRICA) 75 MG capsule, Take 1 capsule (75 mg total) by mouth daily., Disp: 90 capsule, Rfl: 0 .  rOPINIRole (REQUIP) 1 MG tablet, Take 1 tablet (1 mg total) by mouth at bedtime., Disp: 90 tablet, Rfl: 4 .  sennosides-docusate sodium (SENOKOT-S) 8.6-50 MG tablet, Take 2 tablets by mouth daily., Disp: 30 tablet, Rfl: 1 .  tiZANidine (ZANAFLEX) 4 MG capsule, Take 1 capsule (4 mg total) by mouth 3 (three) times daily., Disp: 180 capsule, Rfl: 1 .  tiZANidine (ZANAFLEX) 4 MG tablet, TAKE 1 TABLET BY MOUTH 3  TIMES DAILY AS NEEDED, Disp: 180 tablet, Rfl: 1 .  tolnaftate (TINACTIN) 1 % spray, Apply topically 2 (two) times daily as needed., Disp: 130 g, Rfl: 0 .  traZODone (DESYREL) 100 MG tablet, TAKE 1 TABLET BY MOUTH AT  BEDTIME, Disp: 90 tablet, Rfl: 1  Review of Systems:  Constitutional: Denies fever, chills, diaphoresis, appetite change and fatigue.  HEENT: Denies photophobia, eye pain, redness, hearing loss, ear  pain, congestion, sore throat, rhinorrhea, sneezing, mouth sores, trouble swallowing, neck pain, neck stiffness and tinnitus.   Respiratory: Denies SOB, DOE, cough, chest tightness,  and wheezing.   Cardiovascular: Denies chest pain, palpitations and leg swelling.  Gastrointestinal: Denies nausea, vomiting, abdominal pain, diarrhea, constipation, blood in stool and abdominal distention.  Genitourinary: Denies dysuria, urgency, frequency, hematuria, flank pain and difficulty urinating.  Endocrine: Denies: hot or cold intolerance, sweats, changes in hair or nails, polyuria, polydipsia. Musculoskeletal: Denies  joint swelling, arthralgias and gait problem.  Skin: Denies pallor, rash and wound.  Neurological: Denies dizziness, seizures, syncope, weakness, light-headedness, numbness and headaches.  Hematological: Denies adenopathy. Easy bruising, personal or family bleeding history  Psychiatric/Behavioral: Denies suicidal ideation, mood changes, confusion, nervousness, sleep disturbance and agitation    Physical Exam: Vitals:   08/15/18 0837  BP: 110/68  Pulse: 83  Temp: 98.9 F (37.2 C)  TempSrc: Oral  SpO2: 95%  Weight: 197 lb 3.2 oz (89.4 kg)  Height: 5' 0.75" (1.543 m)    Body mass index is 37.57 kg/m.   Constitutional: NAD, calm, comfortable Eyes: PERRL, lids and conjunctivae normal ENMT: Mucous membranes are moist. Posterior pharynx clear of any exudate or lesions. Normal dentition. Tympanic membrane is pearly white, no erythema or bulging. Neck: normal, supple, no masses, no thyromegaly Respiratory: clear to auscultation bilaterally, no wheezing, no crackles. Normal respiratory effort. No accessory muscle use.  Cardiovascular: Regular rate and rhythm, no murmurs / rubs / gallops. No extremity edema. 2+ pedal pulses. No carotid bruits.  Abdomen: no tenderness, no masses palpated. No hepatosplenomegaly. Bowel sounds positive.  Musculoskeletal: no clubbing / cyanosis. No joint  deformity upper and lower extremities. Good ROM, no contractures. Normal muscle tone.  Skin: no rashes, lesions, ulcers. No induration Neurologic: CN 2-12 grossly intact. Sensation intact, DTR normal. Strength 5/5 in all 4.  Psychiatric: Normal judgment and insight. Alert and oriented x 3. Normal mood.    Subsequent Medicare wellness visit   1. Risk factors, based on past  M,S,F -cardiovascular disease risk factors include age, hyperlipidemia, diabetes.   2.  Physical activities: None, she is  sedentary   3.  Depression/mood:  She is pleasant today, she does have a history of depression and has scored a 6 on PHQ 9 today, no suicidal ideation   4.  Hearing:  No issues   5.  ADL's: Independent in all ADLs although she does use a walker for ambulation purposes   6.  Fall risk:  High fall risk   7.  Home safety: No problems identified   8.  Height weight, and visual acuity: Height and weight as above, visual acuity is 20/25 on the left, she states she does not see well on the right, 20/25 with both eyes, follows routinely with ophthalmology   9.  Counseling:  Have discussed healthy lifestyle in detail including increased nonweightbearing physical activity such as swimming or recumbent bicycle.   10. Lab orders based on risk factors: Laboratory update will be reviewed   11. Referral :  GYN for uterine prolapse, GI for colonoscopy   12. Care plan:  Schedule mammogram, colonoscopy, GYN referral   13. Cognitive assessment:  No cognitive impairment   14. Screening: Patient provided with a written and personalized 5-10 year screening schedule in the AVS.   yes   15. Provider List Update:   PCP, neurosurgeon Dr. Ellene Route, orthopedist Dr. Mardelle Matte, referral to GYN and GI today  16. Advance Directives: Full code     Office Visit from 08/15/2018 in [facility] at [place]  PHQ-9 Total Score  6      Fall Risk  08/15/2018 01/13/2018 08/02/2016 01/28/2015 12/27/2013  Falls in the past  year? 0 0 No No No  Number falls in past yr: 0 0 - - -  Injury with Fall? 0 0 - - -     Impression and Plan:  Encounter for preventive health examination  -She has routine eye and dental care. -Will refer to GI for screening colonoscopy, mammogram will be scheduled today, she will be referred to GYN both for evaluation of her uterine prolapse and for cervical cancer screening. -She is up-to-date on all age-appropriate vaccinations. -We have discussed healthy lifestyle in detail today including increased physical activity and better food choices to achieve a modest weight reduction. -Screening labs to be performed today.   Morbid obesity ([place]), Chronic  -Discussed healthy lifestyle, including increased physical activity and better food choices to promote weight loss.  Diabetes mellitus without complication ([place]), Chronic -Last A1c was 6.2 in November 2019, recheck A1c today.  Essential hypertension  -Well-controlled, continue current regimen  OSA (obstructive sleep apnea) -Noted, CPAP.  Gastroesophageal reflux disease without esophagitis -Well-controlled.  Acquired hypothyroidism -Last TSH was 0.350 in June 2018, recheck TSH today, continue current Synthroid dose for now.  Spondylosis with myelopathy Spinal stenosis, lumbar region, with neurogenic claudication   NCCSRS reviewed in EPIC   Indication for chronic opioid:  Spinal stenosis with chronic back pain Medication and dose:  Hydrocodone 7.5 mg every 6 hours as needed # pills per month:  120 Last UDS date:  Not on file Opioid Treatment Agreement signed:  Yes Opioid Treatment Agreement last reviewed with patient:  01/13/2018 NCCSRS reviewed this encounter (include red flags):   Yes, no red flags, overdose risk score 470.  I am the only prescriber.  -Due for refill on 7/7.  Have noted this on her prescriptions.  Anxiety  -Refill Xanax for 3 months.  Uterine prolapse -GYN referral.  Screening for colon cancer  -GI referral  Screening for breast cancer - Plan: MM Digital Screening  Patient Instructions  -Nice seeing you today!  -Lab work today; will notify you once results are available.  -Schedule follow up in 3 months.  -GYN and GI referrals.  -Mammogram to be requested.   Preventive Care 39 Years and Older, Female Preventive care refers to lifestyle choices and visits with your health care provider that can promote health and wellness. What does preventive care include?  A yearly physical exam. This is also called an annual well check.  Dental exams once or twice a year.  Routine eye exams. Ask your health care provider how often you should have your eyes checked.  Personal lifestyle choices, including: ? Daily care of your teeth and gums. ? Regular physical activity. ? Eating a healthy diet. ? Avoiding tobacco and drug use. ? Limiting alcohol use. ? Practicing safe sex. ? Taking low-dose aspirin every day. ? Taking vitamin and mineral supplements as recommended by your health care provider. What happens during an annual well check? The services and screenings done by your health care provider during your annual well check will depend on your age, overall health, lifestyle risk factors, and family history of disease. Counseling Your health care provider may ask you questions about your:  Alcohol use.  Tobacco use.  Drug use.  Emotional well-being.  Home and relationship well-being.  Sexual activity.  Eating habits.  History of falls.  Memory and ability to understand (cognition).  Work and work Statistician.  Reproductive health.  Screening You may have the following tests or measurements:  Height, weight, and BMI.  Blood pressure.  Lipid and cholesterol levels. These may be checked every 5 years, or more frequently if you are over 36 years old.  Skin check.  Lung cancer screening. You may have this screening every year starting at age 68 if  you have a 30-pack-year history of smoking and currently smoke or have quit within the past 15 years.  Colorectal cancer screening. All adults should have this screening starting at age 91 and continuing until age 44. You will have tests every 1-10 years, depending on your results and the type of screening test. People at increased risk should start screening at an earlier age. Screening tests may include: ? Guaiac-based fecal occult blood testing. ? Fecal immunochemical test (FIT). ? Stool DNA test. ? Virtual colonoscopy. ? Sigmoidoscopy. During this test, a flexible tube with a tiny camera (sigmoidoscope) is used to examine your rectum and lower colon. The sigmoidoscope is inserted through your anus into your rectum and lower colon. ? Colonoscopy. During this test, a long, thin, flexible tube with a tiny camera (colonoscope) is used to examine your entire colon and rectum.  Hepatitis C blood test.  Hepatitis B blood test.  Sexually transmitted disease (STD) testing.  Diabetes screening. This is done by checking your blood sugar (glucose) after you have not eaten for a while (fasting). You may have this done every 1-3 years.  Bone density scan. This is done to screen for osteoporosis. You may have this done starting at age 33.  Mammogram. This may be done every 1-2 years. Talk to your health care provider about how often you should have regular mammograms. Talk with your health care provider about your test results, treatment options, and if necessary, the need for more tests. Vaccines Your health care provider may recommend certain vaccines, such as:  Influenza vaccine. This is recommended every year.  Tetanus, diphtheria, and acellular pertussis (Tdap, Td) vaccine. You may need a Td booster every  10 years.  Varicella vaccine. You may need this if you have not been vaccinated.  Zoster vaccine. You may need this after age 17.  Measles, mumps, and rubella (MMR) vaccine. You may need  at least one dose of MMR if you were born in 1957 or later. You may also need a second dose.  Pneumococcal 13-valent conjugate (PCV13) vaccine. One dose is recommended after age 57.  Pneumococcal polysaccharide (PPSV23) vaccine. One dose is recommended after age 84.  Meningococcal vaccine. You may need this if you have certain conditions.  Hepatitis A vaccine. You may need this if you have certain conditions or if you travel or work in places where you may be exposed to hepatitis A.  Hepatitis B vaccine. You may need this if you have certain conditions or if you travel or work in places where you may be exposed to hepatitis B.  Haemophilus influenzae type b (Hib) vaccine. You may need this if you have certain conditions. Talk to your health care provider about which screenings and vaccines you need and how often you need them. This information is not intended to replace advice given to you by your health care provider. Make sure you discuss any questions you have with your health care provider. Document Released: 03/07/2015 Document Revised: 03/31/2017 Document Reviewed: 12/10/2014 Elsevier Interactive Patient Education  2019 [place], MD [facility]

## 2018-08-15 NOTE — Patient Instructions (Addendum)
-Nice seeing you today!  -Lab work today; will notify you once results are available.  -Schedule follow up in 3 months.  -GYN and GI referrals.  -Mammogram to be requested.   Preventive Care 72 Years and Older, Female Preventive care refers to lifestyle choices and visits with your health care provider that can promote health and wellness. What does preventive care include?  A yearly physical exam. This is also called an annual well check.  Dental exams once or twice a year.  Routine eye exams. Ask your health care provider how often you should have your eyes checked.  Personal lifestyle choices, including: ? Daily care of your teeth and gums. ? Regular physical activity. ? Eating a healthy diet. ? Avoiding tobacco and drug use. ? Limiting alcohol use. ? Practicing safe sex. ? Taking low-dose aspirin every day. ? Taking vitamin and mineral supplements as recommended by your health care provider. What happens during an annual well check? The services and screenings done by your health care provider during your annual well check will depend on your age, overall health, lifestyle risk factors, and family history of disease. Counseling Your health care provider may ask you questions about your:  Alcohol use.  Tobacco use.  Drug use.  Emotional well-being.  Home and relationship well-being.  Sexual activity.  Eating habits.  History of falls.  Memory and ability to understand (cognition).  Work and work Statistician.  Reproductive health.  Screening You may have the following tests or measurements:  Height, weight, and BMI.  Blood pressure.  Lipid and cholesterol levels. These may be checked every 5 years, or more frequently if you are over 64 years old.  Skin check.  Lung cancer screening. You may have this screening every year starting at age 54 if you have a 30-pack-year history of smoking and currently smoke or have quit within the past 15  years.  Colorectal cancer screening. All adults should have this screening starting at age 28 and continuing until age 71. You will have tests every 1-10 years, depending on your results and the type of screening test. People at increased risk should start screening at an earlier age. Screening tests may include: ? Guaiac-based fecal occult blood testing. ? Fecal immunochemical test (FIT). ? Stool DNA test. ? Virtual colonoscopy. ? Sigmoidoscopy. During this test, a flexible tube with a tiny camera (sigmoidoscope) is used to examine your rectum and lower colon. The sigmoidoscope is inserted through your anus into your rectum and lower colon. ? Colonoscopy. During this test, a long, thin, flexible tube with a tiny camera (colonoscope) is used to examine your entire colon and rectum.  Hepatitis C blood test.  Hepatitis B blood test.  Sexually transmitted disease (STD) testing.  Diabetes screening. This is done by checking your blood sugar (glucose) after you have not eaten for a while (fasting). You may have this done every 1-3 years.  Bone density scan. This is done to screen for osteoporosis. You may have this done starting at age 49.  Mammogram. This may be done every 1-2 years. Talk to your health care provider about how often you should have regular mammograms. Talk with your health care provider about your test results, treatment options, and if necessary, the need for more tests. Vaccines Your health care provider may recommend certain vaccines, such as:  Influenza vaccine. This is recommended every year.  Tetanus, diphtheria, and acellular pertussis (Tdap, Td) vaccine. You may need a Td booster every 10 years.  Varicella vaccine. You may need this if you have not been vaccinated.  Zoster vaccine. You may need this after age 38.  Measles, mumps, and rubella (MMR) vaccine. You may need at least one dose of MMR if you were born in 1957 or later. You may also need a second  dose.  Pneumococcal 13-valent conjugate (PCV13) vaccine. One dose is recommended after age 84.  Pneumococcal polysaccharide (PPSV23) vaccine. One dose is recommended after age 52.  Meningococcal vaccine. You may need this if you have certain conditions.  Hepatitis A vaccine. You may need this if you have certain conditions or if you travel or work in places where you may be exposed to hepatitis A.  Hepatitis B vaccine. You may need this if you have certain conditions or if you travel or work in places where you may be exposed to hepatitis B.  Haemophilus influenzae type b (Hib) vaccine. You may need this if you have certain conditions. Talk to your health care provider about which screenings and vaccines you need and how often you need them. This information is not intended to replace advice given to you by your health care provider. Make sure you discuss any questions you have with your health care provider. Document Released: 03/07/2015 Document Revised: 03/31/2017 Document Reviewed: 12/10/2014 Elsevier Interactive Patient Education  2019 [employer].

## 2018-08-17 ENCOUNTER — Other Ambulatory Visit: Payer: Self-pay | Admitting: Internal Medicine

## 2018-08-17 DIAGNOSIS — E039 Hypothyroidism, unspecified: Secondary | ICD-10-CM

## 2018-08-29 ENCOUNTER — Other Ambulatory Visit: Payer: Self-pay | Admitting: Internal Medicine

## 2018-08-29 DIAGNOSIS — M48062 Spinal stenosis, lumbar region with neurogenic claudication: Secondary | ICD-10-CM

## 2018-08-29 NOTE — Telephone Encounter (Signed)
HYDROcodone-acetaminophen (NORCO) 7.5-325 MG tablet Medication [facility] [place] in leiu of written script need for 3 months . [facility] [place] [place] [facility]

## 2018-08-30 ENCOUNTER — Other Ambulatory Visit: Payer: Self-pay | Admitting: Internal Medicine

## 2018-08-30 DIAGNOSIS — M48062 Spinal stenosis, lumbar region with neurogenic claudication: Secondary | ICD-10-CM

## 2018-08-30 MED ORDER — HYDROCODONE-ACETAMINOPHEN 7.5-325 MG PO TABS: 1.0000 | ORAL_TABLET | Freq: Four times a day (QID) | ORAL | 0 refills | Status: DC | PRN

## 2018-08-30 NOTE — Telephone Encounter (Signed)
Sent electronically. Why would they not accept printed Rx? Were they signed?

## 2018-08-30 NOTE — Telephone Encounter (Signed)
Patient took the printed prescription to [facility] but it was not accepted

## 2018-08-31 NOTE — Telephone Encounter (Signed)
The Pharmacist at [facility] will only accept electronic refills.

## 2018-09-11 ENCOUNTER — Telehealth: Payer: Self-pay

## 2018-09-11 ENCOUNTER — Ambulatory Visit: Payer: 59 | Admitting: Obstetrics & Gynecology

## 2018-09-11 NOTE — Telephone Encounter (Signed)
Copied from [place] [id number]. Topic: General - Other >> Sep 11, 2018  3:40 PM Mcneil, Ja-Kwan wrote: Reason for CRM: Pt stated she is not sleeping and she was told that the Rx for traZODone (DESYREL) 100 MG tablet could be adjusted to 2 tablets per night. Pt request that a new Rx for traZODone (DESYREL) 100 MG 2 tablets nightly be sent to [place], [place] [phone] (Phone)  [phone] (Fax)

## 2018-09-12 ENCOUNTER — Other Ambulatory Visit: Payer: Self-pay | Admitting: Internal Medicine

## 2018-09-12 DIAGNOSIS — G4700 Insomnia, unspecified: Secondary | ICD-10-CM

## 2018-09-12 MED ORDER — TRAZODONE HCL 100 MG PO TABS: 100.0000 mg | ORAL_TABLET | Freq: Every day | ORAL | 1 refills | Status: DC

## 2018-09-12 NOTE — Telephone Encounter (Signed)
Sent!

## 2018-09-14 ENCOUNTER — Other Ambulatory Visit: Payer: Self-pay | Admitting: Internal Medicine

## 2018-09-25 ENCOUNTER — Other Ambulatory Visit: Payer: Self-pay

## 2018-09-25 ENCOUNTER — Other Ambulatory Visit (INDEPENDENT_AMBULATORY_CARE_PROVIDER_SITE_OTHER): Payer: Medicare Other

## 2018-09-25 DIAGNOSIS — E039 Hypothyroidism, unspecified: Secondary | ICD-10-CM

## 2018-09-25 LAB — TSH: TSH: 0.36 u[IU]/mL (ref 0.35–4.50)

## 2018-10-02 ENCOUNTER — Other Ambulatory Visit: Payer: Self-pay | Admitting: Internal Medicine

## 2018-10-16 ENCOUNTER — Other Ambulatory Visit: Payer: Self-pay | Admitting: Internal Medicine

## 2018-10-17 ENCOUNTER — Other Ambulatory Visit: Payer: Self-pay | Admitting: Internal Medicine

## 2018-10-17 ENCOUNTER — Other Ambulatory Visit: Payer: Self-pay | Admitting: *Deleted

## 2018-10-17 DIAGNOSIS — G8929 Other chronic pain: Secondary | ICD-10-CM

## 2018-10-18 ENCOUNTER — Other Ambulatory Visit: Payer: Self-pay

## 2018-10-18 ENCOUNTER — Ambulatory Visit
Admission: RE | Admit: 2018-10-18 | Discharge: 2018-10-18 | Disposition: A | Discharge status: Home / Self Care | Payer: Medicare Other | Source: Ambulatory Visit | Attending: Internal Medicine | Admitting: Internal Medicine

## 2018-10-18 DIAGNOSIS — Z1239 Encounter for other screening for malignant neoplasm of breast: Secondary | ICD-10-CM

## 2018-10-20 ENCOUNTER — Other Ambulatory Visit: Payer: Self-pay | Admitting: Internal Medicine

## 2018-10-20 DIAGNOSIS — R928 Other abnormal and inconclusive findings on diagnostic imaging of breast: Secondary | ICD-10-CM

## 2018-10-22 ENCOUNTER — Other Ambulatory Visit: Payer: Self-pay | Admitting: Internal Medicine

## 2018-10-24 HISTORY — PX: BREAST BIOPSY: SHX20

## 2018-10-26 ENCOUNTER — Telehealth: Payer: Self-pay | Admitting: Internal Medicine

## 2018-10-26 NOTE — Telephone Encounter (Signed)
Copied from [place] [id number]. Topic: Quick Communication - Rx Refill/Question >> Oct 26, 2018 11:36 AM Rainey Pines A wrote: Medication:HYDROcodone-acetaminophen ([place]) 7.5-325 MG tablet   Has the patient contacted their pharmacy? Yes (Agent: If no, request that the patient contact the pharmacy for the refill.) (Agent: If yes, when and what did the pharmacy advise?)Contact PCP  Preferred Pharmacy (with phone number or street name): [facility] [address], [place] [phone] (Phone) [phone] (Fax)    Agent: Please be advised that RX refills may take up to 3 business days. We ask that you follow-up with your pharmacy.

## 2018-10-26 NOTE — Telephone Encounter (Signed)
See Pt request for Norco

## 2018-10-26 NOTE — Telephone Encounter (Signed)
Appointment scheduled for 10/27/2018 

## 2018-10-27 ENCOUNTER — Ambulatory Visit
Admission: RE | Admit: 2018-10-27 | Discharge: 2018-10-27 | Disposition: A | Discharge status: Home / Self Care | Payer: Medicare Other | Source: Ambulatory Visit | Attending: Internal Medicine | Admitting: Internal Medicine

## 2018-10-27 ENCOUNTER — Telehealth (INDEPENDENT_AMBULATORY_CARE_PROVIDER_SITE_OTHER): Payer: Medicare Other | Admitting: Internal Medicine

## 2018-10-27 ENCOUNTER — Other Ambulatory Visit: Payer: Self-pay | Admitting: Internal Medicine

## 2018-10-27 ENCOUNTER — Other Ambulatory Visit: Payer: Self-pay

## 2018-10-27 DIAGNOSIS — M48062 Spinal stenosis, lumbar region with neurogenic claudication: Secondary | ICD-10-CM | POA: Diagnosis not present

## 2018-10-27 DIAGNOSIS — M549 Dorsalgia, unspecified: Secondary | ICD-10-CM

## 2018-10-27 DIAGNOSIS — R928 Other abnormal and inconclusive findings on diagnostic imaging of breast: Secondary | ICD-10-CM

## 2018-10-27 DIAGNOSIS — R921 Mammographic calcification found on diagnostic imaging of breast: Secondary | ICD-10-CM

## 2018-10-27 DIAGNOSIS — G8929 Other chronic pain: Secondary | ICD-10-CM

## 2018-10-27 MED ORDER — HYDROCODONE-ACETAMINOPHEN 7.5-325 MG PO TABS: 1.0000 | ORAL_TABLET | Freq: Four times a day (QID) | ORAL | 0 refills | Status: DC | PRN

## 2018-10-27 NOTE — Progress Notes (Signed)
Virtual Visit via Telephone Note  I connected with Katie Booker on 10/27/18 at  3:30 PM EDT by telephone and verified that I am speaking with the correct person using two identifiers.   I discussed the limitations, risks, security and privacy concerns of performing an evaluation and management service by telephone and the availability of in person appointments. I also discussed with the patient that there may be a patient responsible charge related to this service. The patient expressed understanding and agreed to proceed.  Location patient: home Location provider: work office Participants present for the call: patient, provider Patient did not have a visit in the prior 7 days to address this/these issue(s).   History of Present Illness:  This is a scheduled visit for medication refills per opioid contract. Since I last saw her she had an injection to her right hip that seemed to help a little with the pain and will schedule another injection soon. She had a diagnostic mammogram today that showed some calcifications and has been asked to return next week for a biopsy.   Observations/Objective: Patient sounds cheerful and well on the phone. I do not appreciate any increased work of breathing. Speech and thought processing are grossly intact. Patient reported vitals: none reported   Current Outpatient Medications:  .  ALPRAZolam (XANAX) 0.5 MG tablet, Take 1 tablet (0.5 mg total) by mouth 2 (two) times daily as needed for anxiety., Disp: 60 tablet, Rfl: 2 .  atenolol (TENORMIN) 25 MG tablet, TAKE 1 TABLET BY MOUTH  DAILY, Disp: 90 tablet, Rfl: 3 .  atorvastatin (LIPITOR) 40 MG tablet, TAKE 1 TABLET BY MOUTH  DAILY, Disp: 90 tablet, Rfl: 1 .  Calcium Carb-Cholecalciferol (CALCIUM 600/VITAMIN D3 PO), Take 1 tablet by mouth 2 (two) times daily., Disp: , Rfl:  .  Carboxymethylcellulose Sodium (LUBRICANT EYE DROPS OP), Apply 2 drops to eye 4 (four) times daily as needed (dry eyes). ,  Disp: , Rfl:  .  citalopram (CELEXA) 40 MG tablet, TAKE 1 TABLET BY MOUTH  DAILY, Disp: 90 tablet, Rfl: 1 .  docusate sodium (COLACE) 100 MG capsule, Take 200 mg by mouth at bedtime. , Disp: , Rfl:  .  estradiol (ESTRACE) 0.1 MG/GM vaginal cream, PLACE 1 APPLICATORFUL  VAGINALLY 3 TIMES A WEEK., Disp: 170 g, Rfl: 3 .  gemfibrozil (LOPID) 600 MG tablet, TAKE 1 TABLET BY MOUTH TWO  TIMES DAILY BEFORE MEALS, Disp: 180 tablet, Rfl: 1 .  HYDROcodone-acetaminophen (NORCO) 7.5-325 MG tablet, Take 1 tablet by mouth every 6 (six) hours as needed for moderate pain., Disp: 120 tablet, Rfl: 0 .  HYDROcodone-acetaminophen (NORCO) 7.5-325 MG tablet, Take 1 tablet by mouth every 6 (six) hours as needed for moderate pain., Disp: 120 tablet, Rfl: 0 .  HYDROcodone-acetaminophen (NORCO) 7.5-325 MG tablet, Take 1 tablet by mouth every 6 (six) hours as needed for moderate pain., Disp: 120 tablet, Rfl: 0 .  hydrocortisone 2.5 % cream, APPLY 1 APPLICATION  TOPICALLY 2  TIMES DAILY., Disp: 84 g, Rfl: 0 .  Lancets (ONETOUCH ULTRASOFT) lancets, Use daily, Disp: 100 each, Rfl: 6 .  levothyroxine (SYNTHROID) 150 MCG tablet, Take 1 tablet (150 mcg total) by mouth daily., Disp: 90 tablet, Rfl: 1 .  lisinopril (ZESTRIL) 10 MG tablet, TAKE 1 TABLET BY MOUTH  DAILY, Disp: 90 tablet, Rfl: 1 .  loratadine (CLARITIN) 10 MG tablet, Take 10 mg by mouth daily as needed for allergies., Disp: , Rfl:  .  meloxicam (MOBIC) 15 MG  tablet, Take 1 tablet (15 mg total) by mouth daily., Disp: 90 tablet, Rfl: 1 .  metFORMIN (GLUCOPHAGE) 1000 MG tablet, TAKE 1 TABLET BY MOUTH TWO  TIMES DAILY WITH A MEAL, Disp: 180 tablet, Rfl: 1 .  Multiple Vitamin (MULTIVITAMIN) tablet, Take 1 tablet by mouth daily., Disp: , Rfl:  .  Omega-3 Fatty Acids (FISH OIL) 1200 MG CAPS, Take 1 capsule by mouth daily. , Disp: , Rfl:  .  ONE TOUCH ULTRA TEST test strip, USE 1 DAILY AS DIRECTED, Disp: 100 each, Rfl: 3 .  pantoprazole (PROTONIX) 40 MG tablet, Take 1 tablet  (40 mg total) by mouth 2 (two) times daily before a meal., Disp: 180 tablet, Rfl: 3 .  pioglitazone (ACTOS) 15 MG tablet, TAKE 1 TABLET BY MOUTH  DAILY, Disp: 90 tablet, Rfl: 1 .  pregabalin (LYRICA) 75 MG capsule, TAKE 1 CAPSULE BY MOUTH  DAILY, Disp: 90 capsule, Rfl: 1 .  rOPINIRole (REQUIP) 1 MG tablet, Take 1 tablet (1 mg total) by mouth at bedtime., Disp: 90 tablet, Rfl: 4 .  sennosides-docusate sodium (SENOKOT-S) 8.6-50 MG tablet, Take 2 tablets by mouth daily., Disp: 30 tablet, Rfl: 1 .  tiZANidine (ZANAFLEX) 4 MG capsule, Take 1 capsule (4 mg total) by mouth 3 (three) times daily., Disp: 180 capsule, Rfl: 1 .  tiZANidine (ZANAFLEX) 4 MG tablet, TAKE 1 TABLET BY MOUTH 3  TIMES DAILY AS NEEDED, Disp: 180 tablet, Rfl: 1 .  tolnaftate (TINACTIN) 1 % spray, Apply topically 2 (two) times daily as needed., Disp: 130 g, Rfl: 0 .  traZODone (DESYREL) 100 MG tablet, Take 1-2 tablets (100-200 mg total) by mouth at bedtime., Disp: 180 tablet, Rfl: 1  Review of Systems:  Constitutional: Denies fever, chills, diaphoresis, appetite change and fatigue.  HEENT: Denies photophobia, eye pain, redness, hearing loss, ear pain, congestion, sore throat, rhinorrhea, sneezing, mouth sores, trouble swallowing, neck pain, neck stiffness and tinnitus.   Respiratory: Denies SOB, DOE, cough, chest tightness,  and wheezing.   Cardiovascular: Denies chest pain, palpitations and leg swelling.  Gastrointestinal: Denies nausea, vomiting, abdominal pain, diarrhea, constipation, blood in stool and abdominal distention.  Genitourinary: Denies dysuria, urgency, frequency, hematuria, flank pain and difficulty urinating.  Endocrine: Denies: hot or cold intolerance, sweats, changes in hair or nails, polyuria, polydipsia. Musculoskeletal: Denies myalgias, back pain, joint swelling, arthralgias and gait problem.  Skin: Denies pallor, rash and wound.  Neurological: Denies dizziness, seizures, syncope, weakness, light-headedness,  numbness and headaches.  Hematological: Denies adenopathy. Easy bruising, personal or family bleeding history  Psychiatric/Behavioral: Denies suicidal ideation, mood changes, confusion, nervousness, sleep disturbance and agitation   Assessment and Plan:  Spinal stenosis, lumbar region, with neurogenic claudication -Due for q3 month refills today. -NCCSRS reviewed in EPIC   Indication for chronic opioid: chronic back pain, spinal stenosis Medication and dose: norco 7.5 # pills per month: 120  Opioid Treatment Agreement signed: yes Opioid Treatment Agreement last reviewed with patient: 01/13/2018 NCCSRS reviewed this encounter (include red flags):  yes, ORS 440 (she had already disclosed to [place] that she was switching pharmacies as this one has a drive thru.    I discussed the assessment and treatment plan with the patient. The patient was provided an opportunity to ask questions and all were answered. The patient agreed with the plan and demonstrated an understanding of the instructions.   The patient was advised to call back or seek an in-person evaluation if the symptoms worsen or if the condition fails to improve as  anticipated.  I provided 12 minutes of non-face-to-face time during this encounter.   Lelon Frohlich, MD [facility]

## 2018-11-02 ENCOUNTER — Other Ambulatory Visit: Payer: Self-pay | Admitting: Internal Medicine

## 2018-11-02 ENCOUNTER — Ambulatory Visit
Admission: RE | Admit: 2018-11-02 | Discharge: 2018-11-02 | Disposition: A | Discharge status: Home / Self Care | Payer: Medicare Other | Source: Ambulatory Visit | Attending: Internal Medicine | Admitting: Internal Medicine

## 2018-11-02 ENCOUNTER — Other Ambulatory Visit: Payer: Self-pay

## 2018-11-02 DIAGNOSIS — R921 Mammographic calcification found on diagnostic imaging of breast: Secondary | ICD-10-CM

## 2018-11-03 ENCOUNTER — Other Ambulatory Visit: Payer: Self-pay | Admitting: Internal Medicine

## 2018-11-03 DIAGNOSIS — F419 Anxiety disorder, unspecified: Secondary | ICD-10-CM

## 2018-11-13 ENCOUNTER — Other Ambulatory Visit: Payer: Self-pay | Admitting: Internal Medicine

## 2018-11-14 NOTE — Telephone Encounter (Signed)
Forwarding to Dr. Hernandez for approval  

## 2018-11-17 ENCOUNTER — Other Ambulatory Visit: Payer: Self-pay | Admitting: Internal Medicine

## 2018-11-17 DIAGNOSIS — E039 Hypothyroidism, unspecified: Secondary | ICD-10-CM

## 2018-11-24 ENCOUNTER — Other Ambulatory Visit: Payer: Self-pay | Admitting: *Deleted

## 2018-11-24 MED ORDER — PANTOPRAZOLE SODIUM 40 MG PO TBEC: 40.0000 mg | DELAYED_RELEASE_TABLET | Freq: Two times a day (BID) | ORAL | 1 refills | Status: DC

## 2019-01-01 ENCOUNTER — Other Ambulatory Visit: Payer: Self-pay | Admitting: Internal Medicine

## 2019-01-01 NOTE — Telephone Encounter (Signed)
See the below Rx refill request 

## 2019-01-01 NOTE — Telephone Encounter (Signed)
Patient is needing a new prescription filled for the following medications    Medication Refill - Medication: rOPINIRole (REQUIP) 1 MG tablet [[id number]]     Preferred Pharmacy (with phone number or street name):  [place], [place] [address] [place] [address] [place] [address]  Phone: [phone] Fax: [phone]    Agent: Please be advised that RX refills may take up to 3 business days. We ask that you follow-up with your pharmacy.

## 2019-01-01 NOTE — Telephone Encounter (Signed)
Refill request for Requip 1 mg, expired on 11/30/2018.

## 2019-01-02 MED ORDER — ROPINIROLE HCL 1 MG PO TABS: 1.0000 mg | ORAL_TABLET | Freq: Every day | ORAL | 1 refills | Status: DC

## 2019-01-10 ENCOUNTER — Other Ambulatory Visit: Payer: Self-pay | Admitting: Internal Medicine

## 2019-01-23 ENCOUNTER — Other Ambulatory Visit: Payer: Self-pay | Admitting: Internal Medicine

## 2019-01-27 ENCOUNTER — Other Ambulatory Visit: Payer: Self-pay | Admitting: Internal Medicine

## 2019-01-27 DIAGNOSIS — F419 Anxiety disorder, unspecified: Secondary | ICD-10-CM

## 2019-01-30 NOTE — Telephone Encounter (Signed)
Forwarding to PCP.

## 2019-02-12 ENCOUNTER — Other Ambulatory Visit: Payer: Self-pay | Admitting: Internal Medicine

## 2019-02-12 DIAGNOSIS — G4700 Insomnia, unspecified: Secondary | ICD-10-CM

## 2019-03-01 ENCOUNTER — Other Ambulatory Visit: Payer: Self-pay

## 2019-03-01 ENCOUNTER — Ambulatory Visit (INDEPENDENT_AMBULATORY_CARE_PROVIDER_SITE_OTHER): Payer: Medicare Other | Admitting: Internal Medicine

## 2019-03-01 ENCOUNTER — Encounter: Payer: Self-pay | Admitting: Internal Medicine

## 2019-03-01 DIAGNOSIS — G8929 Other chronic pain: Secondary | ICD-10-CM

## 2019-03-01 DIAGNOSIS — M549 Dorsalgia, unspecified: Secondary | ICD-10-CM

## 2019-03-01 DIAGNOSIS — M48062 Spinal stenosis, lumbar region with neurogenic claudication: Secondary | ICD-10-CM | POA: Diagnosis not present

## 2019-03-01 MED ORDER — HYDROCODONE-ACETAMINOPHEN 7.5-325 MG PO TABS: 1.0000 | ORAL_TABLET | Freq: Four times a day (QID) | ORAL | 0 refills | Status: DC | PRN

## 2019-03-01 NOTE — Addendum Note (Signed)
Addended by: Kern Reap B on: 03/01/2019 09:09 AM   Modules accepted: Orders

## 2019-03-01 NOTE — Progress Notes (Signed)
Virtual Visit via Video Note  I connected with Katie Booker on 03/01/19 at  8:30 AM EST by a video enabled telemedicine application and verified that I am speaking with the correct person using two identifiers.  Location patient: home Location provider: work office Persons participating in the virtual visit: patient, provider  I discussed the limitations of evaluation and management by telemedicine and the availability of in person appointments. The patient expressed understanding and agreed to proceed.   HPI: This is a scheduled visit for medication refills per opioid contract.  She has had no further contact with neurosurgery or orthopedics since her last visit.  She is otherwise doing well and has no complaints.  She had some questions about Covid vaccination and I have answered to the best of my ability.   ROS: Constitutional: Denies fever, chills, diaphoresis, appetite change and fatigue.  HEENT: Denies photophobia, eye pain, redness, hearing loss, ear pain, congestion, sore throat, rhinorrhea, sneezing, mouth sores, trouble swallowing, neck pain, neck stiffness and tinnitus.   Respiratory: Denies SOB, DOE, cough, chest tightness,  and wheezing.   Cardiovascular: Denies chest pain, palpitations and leg swelling.  Gastrointestinal: Denies nausea, vomiting, abdominal pain, diarrhea, constipation, blood in stool and abdominal distention.  Genitourinary: Denies dysuria, urgency, frequency, hematuria, flank pain and difficulty urinating.  Endocrine: Denies: hot or cold intolerance, sweats, changes in hair or nails, polyuria, polydipsia. Musculoskeletal: Denies myalgias, back pain, joint swelling, arthralgias and gait problem.  Skin: Denies pallor, rash and wound.  Neurological: Denies dizziness, seizures, syncope, weakness, light-headedness, numbness and headaches.  Hematological: Denies adenopathy. Easy bruising, personal or family bleeding history  Psychiatric/Behavioral: Denies  suicidal ideation, mood changes, confusion, nervousness, sleep disturbance and agitation   Past Medical History:  Diagnosis Date  . Age-related macular degeneration, wet, right eye ([place])    per pt has had treatment in past  . Anxiety   . Chronic constipation   . Chronic low back pain   . Chronic neck pain   . 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 Depression   . DOE (dyspnea on exertion)   . GERD   . Headache   . Hemorrhoids   . History of basal cell carcinoma excision    left cheek  . History of colon polyps   . History of hyperthyroidism 2011   RAI treatement  . History of panic attacks   . Hyperlipidemia   . Hypertension   . Hypothyroidism, postradioiodine therapy    followed by pcp  . IDA (iron deficiency anemia)    intermittant  . Insomnia   . OA (osteoarthritis)   . OSA (obstructive sleep apnea)    no cpap, per pt tried unable to tolerate  . Peripheral neuropathy    legs and feet from hx back surgery's  . Rash    bilateral axilla area -- per pt due to some personal wipes used other the counter  . RLS (restless legs syndrome)   . Spondylolisthesis at L1-L2 level   . Tingling of both upper extremities    left greater than right due to cervical pinched nerve  . Type 2 diabetes mellitus ([place])    followed by pcp  . Umbilical hernia   . Urinary frequency   . Urinary urgency     Past Surgical History:  Procedure Laterality Date  . ANTERIOR CERVICAL DECOMP/DISCECTOMY FUSION N/A 09/02/2014   Procedure: Cervical five- six  Anterior cervical  decompression fusion;  Surgeon: Barnett Abu, MD;  Location: [facility];  Service: Neurosurgery;  Laterality: N/A;  C5-6 Anterior cervical decompression/diskectomy/fusion  . CARPAL TUNNEL RELEASE Right 2016  . CATARACT EXTRACTION W/ INTRAOCULAR LENS  IMPLANT, BILATERAL  03/2015  . COLONOSCOPY  2007  . ESOPHAGOGASTRODUODENOSCOPY  2007  . HEMORRHOID SURGERY  03/23/2011    Procedure: HEMORRHOIDECTOMY;  Surgeon: Clovis Pu. Cornett, MD;  Location: [facility];  Service: General;  Laterality: N/A;  lateral internal sphincterotomy and hemorrhoidectomy  . LUMBAR FUSION  02/2015   L1 -- 2  . PARTIAL KNEE ARTHROPLASTY Left 12/27/2017   Procedure: UNICOMPARTMENTAL LEFT KNEE;  Surgeon: Teryl Lucy, MD;  Location: [facility];  Service: Orthopedics;  Laterality: Left;  . THORACIC DISCECTOMY N/A 12/15/2012   Procedure: Thoracic ten-eleven Thoracic laminectomy;  Surgeon: Barnett Abu, MD;  Location: [facility];  Service: Neurosurgery;  Laterality: N/A;  Thoracic ten-eleven Thoracic laminectomy  . TUBAL LIGATION Bilateral 1991    Family History  Problem Relation Age of Onset  . Diabetes Father   . COPD Father   . Cancer Maternal Grandfather        stomach    SOCIAL HX:   reports that she quit smoking about 15 years ago. Her smoking use included cigarettes. She has a 20.00 pack-year smoking history. She has never used smokeless tobacco. She reports current alcohol use. She reports that she does not use drugs.   Current Outpatient Medications:  .  ALPRAZolam (XANAX) 0.5 MG tablet, TAKE 1 TABLET BY MOUTH  TWICE DAILY AS NEEDED FOR  ANXIETY, Disp: 60 tablet, Rfl: 2 .  atenolol (TENORMIN) 25 MG tablet, TAKE 1 TABLET BY MOUTH  DAILY, Disp: 90 tablet, Rfl: 3 .  atorvastatin (LIPITOR) 40 MG tablet, TAKE 1 TABLET BY MOUTH  DAILY, Disp: 90 tablet, Rfl: 1 .  Calcium Carb-Cholecalciferol (CALCIUM 600/VITAMIN D3 PO), Take 1 tablet by mouth 2 (two) times daily., Disp: , Rfl:  .  Carboxymethylcellulose Sodium (LUBRICANT EYE DROPS OP), Apply 2 drops to eye 4 (four) times daily as needed (dry eyes). , Disp: , Rfl:  .  citalopram (CELEXA) 40 MG tablet, TAKE 1 TABLET BY MOUTH  DAILY, Disp: 90 tablet, Rfl: 3 .  docusate sodium (COLACE) 100 MG capsule, Take 200 mg by mouth at bedtime. , Disp: , Rfl:  .  estradiol (ESTRACE) 0.1 MG/GM vaginal cream, PLACE 1 APPLICATORFUL   VAGINALLY 3 TIMES A WEEK., Disp: 170 g, Rfl: 3 .  gemfibrozil (LOPID) 600 MG tablet, TAKE 1 TABLET BY MOUTH  TWICE DAILY BEFORE MEALS, Disp: 180 tablet, Rfl: 3 .  HYDROcodone-acetaminophen (NORCO) 7.5-325 MG tablet, Take 1 tablet by mouth every 6 (six) hours as needed for moderate pain., Disp: 120 tablet, Rfl: 0 .  HYDROcodone-acetaminophen (NORCO) 7.5-325 MG tablet, Take 1 tablet by mouth every 6 (six) hours as needed for moderate pain., Disp: 120 tablet, Rfl: 0 .  HYDROcodone-acetaminophen (NORCO) 7.5-325 MG tablet, Take 1 tablet by mouth every 6 (six) hours as needed for moderate pain., Disp: 120 tablet, Rfl: 0 .  hydrocortisone 2.5 % cream, APPLY 1 APPLICATION  TOPICALLY 2 TIMES DAILY., Disp: 85.05 g, Rfl: 1 .  Lancets (ONETOUCH ULTRASOFT) lancets, Use daily, Disp: 100 each, Rfl: 6 .  levothyroxine (SYNTHROID) 150 MCG tablet, TAKE 1 TABLET BY MOUTH  DAILY, Disp: 90 tablet, Rfl: 1 .  lisinopril (ZESTRIL) 10 MG tablet, TAKE 1 TABLET BY MOUTH  DAILY, Disp: 90 tablet, Rfl: 3 .  loratadine (CLARITIN) 10  MG tablet, Take 10 mg by mouth daily as needed for allergies., Disp: , Rfl:  .  meloxicam (MOBIC) 15 MG tablet, TAKE 1 TABLET BY MOUTH  DAILY, Disp: 90 tablet, Rfl: 3 .  metFORMIN (GLUCOPHAGE) 1000 MG tablet, TAKE 1 TABLET BY MOUTH TWO  TIMES DAILY WITH MEALS, Disp: 180 tablet, Rfl: 3 .  Multiple Vitamin (MULTIVITAMIN) tablet, Take 1 tablet by mouth daily., Disp: , Rfl:  .  Omega-3 Fatty Acids (FISH OIL) 1200 MG CAPS, Take 1 capsule by mouth daily. , Disp: , Rfl:  .  ONE TOUCH ULTRA TEST test strip, USE 1 DAILY AS DIRECTED, Disp: 100 each, Rfl: 3 .  pantoprazole (PROTONIX) 40 MG tablet, Take 1 tablet (40 mg total) by mouth 2 (two) times daily before a meal., Disp: 180 tablet, Rfl: 1 .  pioglitazone (ACTOS) 15 MG tablet, TAKE 1 TABLET BY MOUTH  DAILY, Disp: 90 tablet, Rfl: 1 .  pregabalin (LYRICA) 75 MG capsule, TAKE 1 CAPSULE BY MOUTH  DAILY, Disp: 90 capsule, Rfl: 1 .  rOPINIRole (REQUIP) 1 MG  tablet, Take 1 tablet (1 mg total) by mouth at bedtime., Disp: 90 tablet, Rfl: 1 .  sennosides-docusate sodium (SENOKOT-S) 8.6-50 MG tablet, Take 2 tablets by mouth daily., Disp: 30 tablet, Rfl: 1 .  tiZANidine (ZANAFLEX) 4 MG capsule, Take 1 capsule (4 mg total) by mouth 3 (three) times daily., Disp: 180 capsule, Rfl: 1 .  tiZANidine (ZANAFLEX) 4 MG tablet, TAKE 1 TABLET BY MOUTH 3  TIMES DAILY AS NEEDED, Disp: 180 tablet, Rfl: 1 .  tolnaftate (TINACTIN) 1 % spray, Apply topically 2 (two) times daily as needed., Disp: 130 g, Rfl: 0 .  traZODone (DESYREL) 100 MG tablet, TAKE 1 TO 2 TABLETS BY  MOUTH AT BEDTIME, Disp: 180 tablet, Rfl: 3  EXAM:   VITALS per patient if applicable: None reported  GENERAL: alert, oriented, appears well and in no acute distress  HEENT: atraumatic, conjunttiva clear, no obvious abnormalities on inspection of external nose and ears  NECK: normal movements of the head and neck  LUNGS: on inspection no signs of respiratory distress, breathing rate appears normal, no obvious gross increased work of breathing, gasping or wheezing  CV: no obvious cyanosis  MS: moves all visible extremities without noticeable abnormality  PSYCH/NEURO: pleasant and cooperative, no obvious depression or anxiety, speech and thought processing grossly intact  ASSESSMENT AND PLAN:   Morbid obesity (HCC) -Discussed healthy lifestyle, including increased physical activity and better food choices to promote weight loss. -She will be given information about Mediterranean diet. -She is requesting referral to healthy weight and wellness.  Other chronic back pain Spinal stenosis, lumbar region, with neurogenic claudication  NCCSRS reviewed in EPIC   Indication for chronic opioid:  Chronic back pain, spinal stenosis Medication and dose:  Norco 7.5/325 mg # pills per month:  120 Last UDS date:  Yes Opioid Treatment Agreement signed:  Yes Opioid Treatment Agreement last reviewed with  patient:  01/13/2018 NCCSRS reviewed this encounter (include red flags):   Yes, overdose risk score 430, no red flags     I discussed the assessment and treatment plan with the patient. The patient was provided an opportunity to ask questions and all were answered. The patient agreed with the plan and demonstrated an understanding of the instructions.   The patient was advised to call back or seek an in-person evaluation if the symptoms worsen or if the condition fails to improve as anticipated.    Javid Kemler  Philip Aspen, MD  [facility] at [facility]

## 2019-03-16 ENCOUNTER — Ambulatory Visit: Payer: Medicare Other | Attending: Internal Medicine

## 2019-03-16 DIAGNOSIS — Z23 Encounter for immunization: Secondary | ICD-10-CM | POA: Insufficient documentation

## 2019-03-16 NOTE — Progress Notes (Signed)
   Covid-19 Vaccination Clinic  Name:  BRYLA BUREK    MRN: [id number] DOB: 1946-02-19  03/16/2019  Ms. Belcastro was observed post Covid-19 immunization for 15 minutes without incidence. She was provided with Vaccine Information Sheet and instruction to access the V-Safe system.   Ms. Satchell was instructed to call 911 with any severe reactions post vaccine: Marland Kitchen Difficulty breathing  . Swelling of your face and throat  . A fast heartbeat  . A bad rash all over your body  . Dizziness and weakness    Immunizations Administered    Name Date Dose VIS Date Route   Pfizer COVID-19 Vaccine 03/16/2019  3:58 PM 0.3 mL 02/02/2019 Intramuscular   Manufacturer: [employer], [employer]   Lot: [id number]   NDC: [address]-5

## 2019-03-23 ENCOUNTER — Other Ambulatory Visit: Payer: Self-pay | Admitting: Internal Medicine

## 2019-03-23 DIAGNOSIS — R921 Mammographic calcification found on diagnostic imaging of breast: Secondary | ICD-10-CM

## 2019-03-28 ENCOUNTER — Other Ambulatory Visit: Payer: Self-pay | Admitting: Internal Medicine

## 2019-03-31 ENCOUNTER — Other Ambulatory Visit: Payer: Self-pay | Admitting: Internal Medicine

## 2019-04-02 ENCOUNTER — Other Ambulatory Visit: Payer: Self-pay | Admitting: Internal Medicine

## 2019-04-03 ENCOUNTER — Ambulatory Visit: Payer: Medicare Other | Attending: Internal Medicine

## 2019-04-03 DIAGNOSIS — Z23 Encounter for immunization: Secondary | ICD-10-CM

## 2019-04-03 NOTE — Progress Notes (Signed)
   Covid-19 Vaccination Clinic  Name:  Katie Booker    MRN: [id number] DOB: Aug 24, 1945  04/03/2019  Katie Booker was observed post Covid-19 immunization for 15 minutes without incidence. She was provided with Vaccine Information Sheet and instruction to access the V-Safe system.   Katie Booker was instructed to call 911 with any severe reactions post vaccine: Marland Kitchen Difficulty breathing  . Swelling of your face and throat  . A fast heartbeat  . A bad rash all over your body  . Dizziness and weakness    Immunizations Administered    Name Date Dose VIS Date Route   Pfizer COVID-19 Vaccine 04/03/2019  1:28 PM 0.3 mL 02/02/2019 Intramuscular   Manufacturer: [employer], [employer]   Lot: [id number]   NDC: [address]-9

## 2019-04-26 DIAGNOSIS — M25551 Pain in right hip: Secondary | ICD-10-CM | POA: Diagnosis not present

## 2019-05-02 ENCOUNTER — Other Ambulatory Visit: Payer: Self-pay | Admitting: Internal Medicine

## 2019-05-02 DIAGNOSIS — F419 Anxiety disorder, unspecified: Secondary | ICD-10-CM

## 2019-05-03 DIAGNOSIS — H26491 Other secondary cataract, right eye: Secondary | ICD-10-CM | POA: Diagnosis not present

## 2019-05-03 DIAGNOSIS — H353121 Nonexudative age-related macular degeneration, left eye, early dry stage: Secondary | ICD-10-CM | POA: Diagnosis not present

## 2019-05-03 DIAGNOSIS — Z961 Presence of intraocular lens: Secondary | ICD-10-CM | POA: Diagnosis not present

## 2019-05-03 DIAGNOSIS — H26492 Other secondary cataract, left eye: Secondary | ICD-10-CM | POA: Diagnosis not present

## 2019-05-03 DIAGNOSIS — H18413 Arcus senilis, bilateral: Secondary | ICD-10-CM | POA: Diagnosis not present

## 2019-05-04 ENCOUNTER — Other Ambulatory Visit: Payer: Self-pay | Admitting: Internal Medicine

## 2019-05-04 DIAGNOSIS — E039 Hypothyroidism, unspecified: Secondary | ICD-10-CM

## 2019-05-15 DIAGNOSIS — H353212 Exudative age-related macular degeneration, right eye, with inactive choroidal neovascularization: Secondary | ICD-10-CM | POA: Diagnosis not present

## 2019-05-15 DIAGNOSIS — H35372 Puckering of macula, left eye: Secondary | ICD-10-CM | POA: Diagnosis not present

## 2019-05-15 DIAGNOSIS — H353123 Nonexudative age-related macular degeneration, left eye, advanced atrophic without subfoveal involvement: Secondary | ICD-10-CM | POA: Diagnosis not present

## 2019-05-15 DIAGNOSIS — E119 Type 2 diabetes mellitus without complications: Secondary | ICD-10-CM | POA: Diagnosis not present

## 2019-05-18 ENCOUNTER — Other Ambulatory Visit: Payer: Self-pay | Admitting: Internal Medicine

## 2019-05-21 DIAGNOSIS — H26491 Other secondary cataract, right eye: Secondary | ICD-10-CM | POA: Diagnosis not present

## 2019-05-22 DIAGNOSIS — H26491 Other secondary cataract, right eye: Secondary | ICD-10-CM | POA: Diagnosis not present

## 2019-05-23 ENCOUNTER — Other Ambulatory Visit: Payer: Self-pay

## 2019-05-23 ENCOUNTER — Ambulatory Visit
Admission: RE | Admit: 2019-05-23 | Discharge: 2019-05-23 | Disposition: A | Discharge status: Home / Self Care | Payer: Medicare Other | Source: Ambulatory Visit | Attending: Internal Medicine | Admitting: Internal Medicine

## 2019-05-23 DIAGNOSIS — R921 Mammographic calcification found on diagnostic imaging of breast: Secondary | ICD-10-CM

## 2019-05-25 ENCOUNTER — Other Ambulatory Visit: Payer: Self-pay | Admitting: Family

## 2019-05-25 ENCOUNTER — Other Ambulatory Visit: Payer: Self-pay | Admitting: Internal Medicine

## 2019-05-25 DIAGNOSIS — F419 Anxiety disorder, unspecified: Secondary | ICD-10-CM

## 2019-05-28 ENCOUNTER — Other Ambulatory Visit: Payer: Self-pay | Admitting: Internal Medicine

## 2019-05-28 DIAGNOSIS — M48062 Spinal stenosis, lumbar region with neurogenic claudication: Secondary | ICD-10-CM

## 2019-05-28 NOTE — Telephone Encounter (Signed)
Medication:Hydrocodone  Pharmacy:[facility]

## 2019-05-28 NOTE — Telephone Encounter (Signed)
Last 3 Rxs given on 1/7 for #120

## 2019-05-29 MED ORDER — HYDROCODONE-ACETAMINOPHEN 7.5-325 MG PO TABS: 1.0000 | ORAL_TABLET | Freq: Four times a day (QID) | ORAL | 0 refills | Status: DC | PRN

## 2019-05-29 NOTE — Telephone Encounter (Signed)
Patient has scheduled a virtual visit for 06/01/19 which is the first available.  Can we send in the first prescription before her appointment because she will be out 05/30/19?

## 2019-05-29 NOTE — Telephone Encounter (Signed)
Please schedule OV for refills (could be VV)

## 2019-06-01 ENCOUNTER — Telehealth (INDEPENDENT_AMBULATORY_CARE_PROVIDER_SITE_OTHER): Payer: Medicare Other | Admitting: Internal Medicine

## 2019-06-01 DIAGNOSIS — F419 Anxiety disorder, unspecified: Secondary | ICD-10-CM

## 2019-06-01 DIAGNOSIS — M48062 Spinal stenosis, lumbar region with neurogenic claudication: Secondary | ICD-10-CM

## 2019-06-01 MED ORDER — ALPRAZOLAM 0.5 MG PO TABS: 0.5000 mg | ORAL_TABLET | Freq: Two times a day (BID) | ORAL | 2 refills | Status: DC | PRN

## 2019-06-01 MED ORDER — HYDROCODONE-ACETAMINOPHEN 7.5-325 MG PO TABS: 1.0000 | ORAL_TABLET | Freq: Four times a day (QID) | ORAL | 0 refills | Status: DC | PRN

## 2019-06-01 NOTE — Progress Notes (Signed)
Virtual Visit via Telephone Note  I connected with Katie Booker on 06/01/19 at  1:45 PM EDT by telephone and verified that I am speaking with the correct person using two identifiers.   I discussed the limitations, risks, security and privacy concerns of performing an evaluation and management service by telephone and the availability of in person appointments. I also discussed with the patient that there may be a patient responsible charge related to this service. The patient expressed understanding and agreed to proceed.  Location patient: home Location provider: work office Participants present for the call: patient, provider Patient did not have a visit in the prior 7 days to address this/these issue(s).   History of Present Illness:  She has scheduled this visit for narcotic and BDZ refills per contract. She would also like to check on the status of her referral to the weight management clinic. She has received both COVID vaccines since we last spoke. Has been feeling well. Has had some increased anxiety over social isolation in the midst of the pandemic, but feels safer now that she is vaccinated.   Observations/Objective: Patient sounds cheerful and well on the phone. I do not appreciate any increased work of breathing. Speech and thought processing are grossly intact. Patient reported vitals: none reported   Current Outpatient Medications:  .  ALPRAZolam (XANAX) 0.5 MG tablet, Take 1 tablet (0.5 mg total) by mouth 2 (two) times daily as needed. for anxiety, Disp: 60 tablet, Rfl: 2 .  atenolol (TENORMIN) 25 MG tablet, TAKE 1 TABLET BY MOUTH  DAILY, Disp: 90 tablet, Rfl: 3 .  atorvastatin (LIPITOR) 40 MG tablet, TAKE 1 TABLET BY MOUTH  DAILY, Disp: 90 tablet, Rfl: 1 .  Calcium Carb-Cholecalciferol (CALCIUM 600/VITAMIN D3 PO), Take 1 tablet by mouth 2 (two) times daily., Disp: , Rfl:  .  Carboxymethylcellulose Sodium (LUBRICANT EYE DROPS OP), Apply 2 drops to eye 4 (four)  times daily as needed (dry eyes). , Disp: , Rfl:  .  citalopram (CELEXA) 40 MG tablet, TAKE 1 TABLET BY MOUTH  DAILY, Disp: 90 tablet, Rfl: 3 .  docusate sodium (COLACE) 100 MG capsule, Take 200 mg by mouth at bedtime. , Disp: , Rfl:  .  estradiol (ESTRACE) 0.1 MG/GM vaginal cream, PLACE 1 APPLICATORFUL  VAGINALLY 3 TIMES A WEEK., Disp: 170 g, Rfl: 3 .  gemfibrozil (LOPID) 600 MG tablet, TAKE 1 TABLET BY MOUTH  TWICE DAILY BEFORE MEALS, Disp: 180 tablet, Rfl: 3 .  HYDROcodone-acetaminophen (NORCO) 7.5-325 MG tablet, Take 1 tablet by mouth every 6 (six) hours as needed for moderate pain., Disp: 120 tablet, Rfl: 0 .  HYDROcodone-acetaminophen (NORCO) 7.5-325 MG tablet, Take 1 tablet by mouth every 6 (six) hours as needed for moderate pain., Disp: 120 tablet, Rfl: 0 .  HYDROcodone-acetaminophen (NORCO) 7.5-325 MG tablet, Take 1 tablet by mouth every 6 (six) hours as needed for moderate pain., Disp: 120 tablet, Rfl: 0 .  hydrocortisone 2.5 % cream, APPLY 1 APPLICATION  TOPICALLY 2 TIMES DAILY., Disp: 85.05 g, Rfl: 0 .  Lancets (ONETOUCH ULTRASOFT) lancets, Use daily, Disp: 100 each, Rfl: 6 .  levothyroxine (SYNTHROID) 150 MCG tablet, TAKE 1 TABLET BY MOUTH  DAILY, Disp: 90 tablet, Rfl: 0 .  lisinopril (ZESTRIL) 10 MG tablet, TAKE 1 TABLET BY MOUTH  DAILY, Disp: 90 tablet, Rfl: 3 .  loratadine (CLARITIN) 10 MG tablet, Take 10 mg by mouth daily as needed for allergies., Disp: , Rfl:  .  meloxicam (MOBIC) 15  MG tablet, TAKE 1 TABLET BY MOUTH  DAILY, Disp: 90 tablet, Rfl: 3 .  metFORMIN (GLUCOPHAGE) 1000 MG tablet, TAKE 1 TABLET BY MOUTH TWO  TIMES DAILY WITH MEALS, Disp: 180 tablet, Rfl: 3 .  Multiple Vitamin (MULTIVITAMIN) tablet, Take 1 tablet by mouth daily., Disp: , Rfl:  .  Omega-3 Fatty Acids (FISH OIL) 1200 MG CAPS, Take 1 capsule by mouth daily. , Disp: , Rfl:  .  ONE TOUCH ULTRA TEST test strip, USE 1 DAILY AS DIRECTED, Disp: 100 each, Rfl: 3 .  pantoprazole (PROTONIX) 40 MG tablet, TAKE 1 TABLET  BY MOUTH  TWICE DAILY BEFORE MEALS, Disp: 180 tablet, Rfl: 1 .  pioglitazone (ACTOS) 15 MG tablet, TAKE 1 TABLET BY MOUTH  DAILY, Disp: 90 tablet, Rfl: 1 .  pregabalin (LYRICA) 75 MG capsule, TAKE 1 CAPSULE BY MOUTH  DAILY, Disp: 90 capsule, Rfl: 1 .  rOPINIRole (REQUIP) 1 MG tablet, TAKE 1 TABLET BY MOUTH AT  BEDTIME, Disp: 90 tablet, Rfl: 1 .  sennosides-docusate sodium (SENOKOT-S) 8.6-50 MG tablet, Take 2 tablets by mouth daily., Disp: 30 tablet, Rfl: 1 .  tiZANidine (ZANAFLEX) 4 MG capsule, Take 1 capsule (4 mg total) by mouth 3 (three) times daily., Disp: 180 capsule, Rfl: 1 .  tiZANidine (ZANAFLEX) 4 MG tablet, TAKE 1 TABLET BY MOUTH 3  TIMES DAILY AS NEEDED, Disp: 180 tablet, Rfl: 1 .  tolnaftate (TINACTIN) 1 % spray, Apply topically 2 (two) times daily as needed., Disp: 130 g, Rfl: 0 .  traZODone (DESYREL) 100 MG tablet, TAKE 1 TO 2 TABLETS BY  MOUTH AT BEDTIME, Disp: 180 tablet, Rfl: 3  Review of Systems:  Constitutional: Denies fever, chills, diaphoresis, appetite change and fatigue.  HEENT: Denies photophobia, eye pain, redness, hearing loss, ear pain, congestion, sore throat, rhinorrhea, sneezing, mouth sores, trouble swallowing, neck pain, neck stiffness and tinnitus.   Respiratory: Denies SOB, DOE, cough, chest tightness,  and wheezing.   Cardiovascular: Denies chest pain, palpitations and leg swelling.  Gastrointestinal: Denies nausea, vomiting, abdominal pain, diarrhea, constipation, blood in stool and abdominal distention.  Genitourinary: Denies dysuria, urgency, frequency, hematuria, flank pain and difficulty urinating.  Endocrine: Denies: hot or cold intolerance, sweats, changes in hair or nails, polyuria, polydipsia. Musculoskeletal: Denies myalgias, back pain, joint swelling, arthralgias and gait problem.  Skin: Denies pallor, rash and wound.  Neurological: Denies dizziness, seizures, syncope, weakness, light-headedness, numbness and headaches.  Hematological: Denies  adenopathy. Easy bruising, personal or family bleeding history  Psychiatric/Behavioral: Denies suicidal ideation, mood changes, confusion, nervousness, sleep disturbance and agitation   Assessment and Plan:  Spinal stenosis, lumbar region, with neurogenic claudication  -PDMP reviewed, no red flags ORS 450. -Refill hydrocodone 7.7/325 mg q 6 h PRN for #120 tabs per month for 3 months.  Anxiety  Refill xanax 0.5 mg BID PRN #60 per month for 3 months.  Morbid obesity ([place]) -Referral was placed in January. Will send patient info via [place].    I discussed the assessment and treatment plan with the patient. The patient was provided an opportunity to ask questions and all were answered. The patient agreed with the plan and demonstrated an understanding of the instructions.   The patient was advised to call back or seek an in-person evaluation if the symptoms worsen or if the condition fails to improve as anticipated.  I provided 15 minutes of non-face-to-face time during this encounter.   Lelon Frohlich, MD [facility] at [facility]

## 2019-06-07 ENCOUNTER — Ambulatory Visit: Payer: Self-pay | Admitting: Internal Medicine

## 2019-06-27 ENCOUNTER — Other Ambulatory Visit: Payer: Self-pay | Admitting: Internal Medicine

## 2019-06-27 DIAGNOSIS — E039 Hypothyroidism, unspecified: Secondary | ICD-10-CM

## 2019-06-27 DIAGNOSIS — G8929 Other chronic pain: Secondary | ICD-10-CM

## 2019-06-27 DIAGNOSIS — M549 Dorsalgia, unspecified: Secondary | ICD-10-CM

## 2019-08-14 DIAGNOSIS — H353123 Nonexudative age-related macular degeneration, left eye, advanced atrophic without subfoveal involvement: Secondary | ICD-10-CM | POA: Diagnosis not present

## 2019-08-14 DIAGNOSIS — H353212 Exudative age-related macular degeneration, right eye, with inactive choroidal neovascularization: Secondary | ICD-10-CM | POA: Diagnosis not present

## 2019-08-18 ENCOUNTER — Other Ambulatory Visit: Payer: Self-pay | Admitting: Internal Medicine

## 2019-08-21 ENCOUNTER — Ambulatory Visit: Payer: Medicare Other | Admitting: Internal Medicine

## 2019-08-28 ENCOUNTER — Other Ambulatory Visit: Payer: Self-pay | Admitting: Internal Medicine

## 2019-08-28 DIAGNOSIS — F419 Anxiety disorder, unspecified: Secondary | ICD-10-CM

## 2019-08-29 NOTE — Telephone Encounter (Signed)
Please deny.  There should be a refill on file.  thanks

## 2019-08-30 ENCOUNTER — Telehealth: Payer: Self-pay | Admitting: Internal Medicine

## 2019-08-30 DIAGNOSIS — F419 Anxiety disorder, unspecified: Secondary | ICD-10-CM

## 2019-08-30 NOTE — Telephone Encounter (Signed)
Pt received a text from [facility] mail service yesterday regarding her medication being rejected. Pt does not know why her Katie Booker was rejected. Pt would like to speak to you. Thanks

## 2019-08-30 NOTE — Addendum Note (Signed)
Addended by: Kern Reap B on: 08/30/2019 04:42 PM   Modules accepted: Orders

## 2019-08-30 NOTE — Telephone Encounter (Signed)
Patient request a refill of Xanax. The original request went to [facility].

## 2019-08-31 ENCOUNTER — Telehealth (INDEPENDENT_AMBULATORY_CARE_PROVIDER_SITE_OTHER): Payer: Medicare Other | Admitting: Internal Medicine

## 2019-08-31 ENCOUNTER — Other Ambulatory Visit: Payer: Self-pay

## 2019-08-31 DIAGNOSIS — F419 Anxiety disorder, unspecified: Secondary | ICD-10-CM | POA: Diagnosis not present

## 2019-08-31 DIAGNOSIS — F331 Major depressive disorder, recurrent, moderate: Secondary | ICD-10-CM | POA: Diagnosis not present

## 2019-08-31 MED ORDER — ALPRAZOLAM 0.5 MG PO TABS: 0.5000 mg | ORAL_TABLET | Freq: Two times a day (BID) | ORAL | 3 refills | Status: DC | PRN

## 2019-08-31 NOTE — Progress Notes (Signed)
Virtual Visit via Telephone Note  I connected with [name] on 08/31/19 at 10:00 AM EDT by telephone and verified that I am speaking with the correct person using two identifiers.   I discussed the limitations, risks, security and privacy concerns of performing an evaluation and management service by telephone and the availability of in person appointments. I also discussed with the patient that there may be a patient responsible charge related to this service. The patient expressed understanding and agreed to proceed.  Location patient: home Location provider: work office Participants present for the call: patient, provider Patient did not have a visit in the prior 7 days to address this/these issue(s).   History of Present Illness:  She has scheduled this visit mainly for her 44-month Xanax refills.  She is not quite yet due for hydrocodone, this is due on August 4.  She has not had an in office visit in quite some time and is due to have her diabetes and blood pressure rechecked as well as her annual physical.  She will schedule appointment for this in 3 months.  She tells me that she feels like she has been more depressed this summer.  She lacks motivation to do things, has been having issues with her finances, feels like her back pain severely limits her daily function.  She feels like "I am not moving forward".  She still has her Christmas decorations in her living room due to lack of motivation and putting them away.  She denies suicidal intent.   Observations/Objective: Patient sounds cheerful and well on the phone. I do not appreciate any increased work of breathing. Speech and thought processing are grossly intact. Patient reported vitals: None reported   Current Outpatient Medications:  .  ALPRAZolam (XANAX) 0.5 MG tablet, Take 1 tablet (0.5 mg total) by mouth 2 (two) times daily as needed. for anxiety, Disp: 60 tablet, Rfl: 3 .  atenolol (TENORMIN) 25 MG tablet,  TAKE 1 TABLET BY MOUTH  DAILY, Disp: 90 tablet, Rfl: 0 .  atorvastatin (LIPITOR) 40 MG tablet, TAKE 1 TABLET BY MOUTH  DAILY, Disp: 90 tablet, Rfl: 1 .  Calcium Carb-Cholecalciferol (CALCIUM 600/VITAMIN D3 PO), Take 1 tablet by mouth 2 (two) times daily., Disp: , Rfl:  .  Carboxymethylcellulose Sodium (LUBRICANT EYE DROPS OP), Apply 2 drops to eye 4 (four) times daily as needed (dry eyes). , Disp: , Rfl:  .  citalopram (CELEXA) 40 MG tablet, TAKE 1 TABLET BY MOUTH  DAILY, Disp: 90 tablet, Rfl: 3 .  docusate sodium (COLACE) 100 MG capsule, Take 200 mg by mouth at bedtime. , Disp: , Rfl:  .  estradiol (ESTRACE) 0.1 MG/GM vaginal cream, PLACE 1 APPLICATORFUL  VAGINALLY 3 TIMES A WEEK., Disp: 170 g, Rfl: 3 .  gemfibrozil (LOPID) 600 MG tablet, TAKE 1 TABLET BY MOUTH  TWICE DAILY BEFORE MEALS, Disp: 180 tablet, Rfl: 3 .  HYDROcodone-acetaminophen (NORCO) 7.5-325 MG tablet, Take 1 tablet by mouth every 6 (six) hours as needed for moderate pain., Disp: 120 tablet, Rfl: 0 .  HYDROcodone-acetaminophen (NORCO) 7.5-325 MG tablet, Take 1 tablet by mouth every 6 (six) hours as needed for moderate pain., Disp: 120 tablet, Rfl: 0 .  HYDROcodone-acetaminophen (NORCO) 7.5-325 MG tablet, Take 1 tablet by mouth every 6 (six) hours as needed for moderate pain., Disp: 120 tablet, Rfl: 0 .  hydrocortisone 2.5 % cream, APPLY TO AFFECTED AREA(S)  TOPICALLY TWICE DAILY, Disp: 85.05 g, Rfl: 0 .  Lancets [facility]  ULTRASOFT) lancets, Use daily, Disp: 100 each, Rfl: 6 .  levothyroxine (SYNTHROID) 150 MCG tablet, TAKE 1 TABLET BY MOUTH  DAILY, Disp: 90 tablet, Rfl: 3 .  lisinopril (ZESTRIL) 10 MG tablet, TAKE 1 TABLET BY MOUTH  DAILY, Disp: 90 tablet, Rfl: 3 .  loratadine (CLARITIN) 10 MG tablet, Take 10 mg by mouth daily as needed for allergies., Disp: , Rfl:  .  meloxicam (MOBIC) 15 MG tablet, TAKE 1 TABLET BY MOUTH  DAILY, Disp: 90 tablet, Rfl: 3 .  metFORMIN (GLUCOPHAGE) 1000 MG tablet, TAKE 1 TABLET BY MOUTH TWO  TIMES  DAILY WITH MEALS, Disp: 180 tablet, Rfl: 3 .  Multiple Vitamin (MULTIVITAMIN) tablet, Take 1 tablet by mouth daily., Disp: , Rfl:  .  Omega-3 Fatty Acids (FISH OIL) 1200 MG CAPS, Take 1 capsule by mouth daily. , Disp: , Rfl:  .  ONE TOUCH ULTRA TEST test strip, USE 1 DAILY AS DIRECTED, Disp: 100 each, Rfl: 3 .  pantoprazole (PROTONIX) 40 MG tablet, TAKE 1 TABLET BY MOUTH  TWICE DAILY BEFORE MEALS, Disp: 180 tablet, Rfl: 1 .  pioglitazone (ACTOS) 15 MG tablet, TAKE 1 TABLET BY MOUTH  DAILY, Disp: 90 tablet, Rfl: 1 .  pregabalin (LYRICA) 75 MG capsule, TAKE 1 CAPSULE BY MOUTH  DAILY, Disp: 90 capsule, Rfl: 1 .  rOPINIRole (REQUIP) 1 MG tablet, TAKE 1 TABLET BY MOUTH AT  BEDTIME, Disp: 90 tablet, Rfl: 1 .  sennosides-docusate sodium (SENOKOT-S) 8.6-50 MG tablet, Take 2 tablets by mouth daily., Disp: 30 tablet, Rfl: 1 .  tiZANidine (ZANAFLEX) 4 MG capsule, Take 1 capsule (4 mg total) by mouth 3 (three) times daily., Disp: 180 capsule, Rfl: 1 .  tiZANidine (ZANAFLEX) 4 MG tablet, TAKE 1 TABLET BY MOUTH 3  TIMES DAILY AS NEEDED, Disp: 180 tablet, Rfl: 1 .  tolnaftate (TINACTIN) 1 % spray, Apply topically 2 (two) times daily as needed., Disp: 130 g, Rfl: 0 .  traZODone (DESYREL) 100 MG tablet, TAKE 1 TO 2 TABLETS BY  MOUTH AT BEDTIME, Disp: 180 tablet, Rfl: 3  Review of Systems:  Constitutional: Denies fever, chills, diaphoresis, appetite change and fatigue.  HEENT: Denies photophobia, eye pain, redness, hearing loss, ear pain, congestion, sore throat, rhinorrhea, sneezing, mouth sores, trouble swallowing, neck pain, neck stiffness and tinnitus.   Respiratory: Denies SOB, DOE, cough, chest tightness,  and wheezing.   Cardiovascular: Denies chest pain, palpitations and leg swelling.  Gastrointestinal: Denies nausea, vomiting, abdominal pain, diarrhea, constipation, blood in stool and abdominal distention.  Genitourinary: Denies dysuria, urgency, frequency, hematuria, flank pain and difficulty urinating.   Endocrine: Denies: hot or cold intolerance, sweats, changes in hair or nails, polyuria, polydipsia. Musculoskeletal: Denies myalgias,  joint swelling, arthralgias and gait problem.  Skin: Denies pallor, rash and wound.  Neurological: Denies dizziness, seizures, syncope, weakness, light-headedness, numbness and headaches.  Hematological: Denies adenopathy. Easy bruising, personal or family bleeding history  Psychiatric/Behavioral: Denies suicidal ideation, confusion, nervousness, sleep disturbance and agitation   Assessment and Plan:  Moderate episode of recurrent major depressive disorder (HCC) -PDMP has been reviewed.  She has a very high overdose risk score of 520 on account of her combined narcotic and benzodiazepine prescription.  She also has 2 red flags: For greater than 5 providers and greater than 4 pharmacies in the last 2 years.  Upon further review all of her narcotic and benzodiazepine prescriptions have been either by myself or from another physician covering for me in the office.  She has never requested  early refills.  She has notified us when she requests a pharmacy change. -I will refill her Xanax today as prescribed for 60 tablets a month for 3 months. -We have discussed her depression.  She is very interested in resuming CBT sessions, however states that her financial situation limits what she can do.  Will investigate to see if there are any psychotherapy services that can provide services on a sliding scale fee and contact her with that information.  Anxiety  - Plan: ALPRAZolam (XANAX) 0.5 MG tablet  52-month in office follow-up.    I discussed the assessment and treatment plan with the patient. The patient was provided an opportunity to ask questions and all were answered. The patient agreed with the plan and demonstrated an understanding of the instructions.   The patient was advised to call back or seek an in-person evaluation if the symptoms worsen or if the  condition fails to improve as anticipated.  I provided 14 minutes of non-face-to-face time during this encounter.   Chaya Jan, MD [facility] at [facility]

## 2019-09-01 ENCOUNTER — Other Ambulatory Visit: Payer: Self-pay | Admitting: Internal Medicine

## 2019-09-11 ENCOUNTER — Other Ambulatory Visit: Payer: Self-pay | Admitting: Internal Medicine

## 2019-09-12 ENCOUNTER — Other Ambulatory Visit: Payer: Self-pay | Admitting: Internal Medicine

## 2019-09-12 DIAGNOSIS — Z1231 Encounter for screening mammogram for malignant neoplasm of breast: Secondary | ICD-10-CM

## 2019-09-18 ENCOUNTER — Ambulatory Visit: Payer: Medicare Other | Admitting: Internal Medicine

## 2019-09-21 ENCOUNTER — Other Ambulatory Visit: Payer: Self-pay | Admitting: Internal Medicine

## 2019-09-21 DIAGNOSIS — M48062 Spinal stenosis, lumbar region with neurogenic claudication: Secondary | ICD-10-CM

## 2019-09-21 NOTE — Telephone Encounter (Signed)
Pt need a refill on TakingNot TakingUnknown    HYDROcodone-acetaminophen [facility] Sent to  [id number] [place], [place] - [address] Phone:  [phone]  Fax:  [phone]

## 2019-09-24 ENCOUNTER — Telehealth: Payer: Self-pay | Admitting: Internal Medicine

## 2019-09-24 NOTE — Telephone Encounter (Signed)
Patient needs a refill on Hydrocodone.  Pt is requesting a call back.  Pharmacy- [facility] [address]

## 2019-09-25 ENCOUNTER — Other Ambulatory Visit: Payer: Self-pay | Admitting: Internal Medicine

## 2019-09-25 DIAGNOSIS — M48062 Spinal stenosis, lumbar region with neurogenic claudication: Secondary | ICD-10-CM

## 2019-09-25 MED ORDER — HYDROCODONE-ACETAMINOPHEN 7.5-325 MG PO TABS: 1.0000 | ORAL_TABLET | Freq: Four times a day (QID) | ORAL | 0 refills | Status: DC | PRN

## 2019-09-25 NOTE — Telephone Encounter (Signed)
See phone note

## 2019-09-25 NOTE — Telephone Encounter (Signed)
Pt called to say her medication refill for   HYDROcodone-acetaminophen (NORCO) 7.5-325 MG tablet   Is being sent by mail order from [facility]  And she needs it to go to the pharmacy at [facility] [address]  Please advise

## 2019-09-25 NOTE — Telephone Encounter (Signed)
Patient had a virtual visit 08/30/89.  Her refills were not due at that time.

## 2019-09-25 NOTE — Telephone Encounter (Signed)
Okay to resend. 

## 2019-09-25 NOTE — Telephone Encounter (Signed)
Yes, cancel the mail order one (I don't think they will process it regardless)

## 2019-10-02 ENCOUNTER — Other Ambulatory Visit: Payer: Self-pay | Admitting: Internal Medicine

## 2019-10-11 ENCOUNTER — Other Ambulatory Visit: Payer: Self-pay | Admitting: Internal Medicine

## 2019-10-19 ENCOUNTER — Other Ambulatory Visit: Payer: Self-pay

## 2019-10-19 ENCOUNTER — Ambulatory Visit
Admission: RE | Admit: 2019-10-19 | Discharge: 2019-10-19 | Disposition: A | Discharge status: Home / Self Care | Payer: Medicare Other | Source: Ambulatory Visit | Attending: Internal Medicine | Admitting: Internal Medicine

## 2019-10-19 DIAGNOSIS — Z1231 Encounter for screening mammogram for malignant neoplasm of breast: Secondary | ICD-10-CM | POA: Diagnosis not present

## 2019-11-07 ENCOUNTER — Other Ambulatory Visit: Payer: Self-pay | Admitting: Internal Medicine

## 2019-11-13 DIAGNOSIS — H353123 Nonexudative age-related macular degeneration, left eye, advanced atrophic without subfoveal involvement: Secondary | ICD-10-CM | POA: Diagnosis not present

## 2019-11-13 DIAGNOSIS — E119 Type 2 diabetes mellitus without complications: Secondary | ICD-10-CM | POA: Diagnosis not present

## 2019-11-13 DIAGNOSIS — H35372 Puckering of macula, left eye: Secondary | ICD-10-CM | POA: Diagnosis not present

## 2019-11-13 DIAGNOSIS — H353211 Exudative age-related macular degeneration, right eye, with active choroidal neovascularization: Secondary | ICD-10-CM | POA: Diagnosis not present

## 2019-11-16 ENCOUNTER — Other Ambulatory Visit: Payer: Self-pay | Admitting: Internal Medicine

## 2019-11-27 ENCOUNTER — Ambulatory Visit: Payer: Medicare Other

## 2019-11-28 DIAGNOSIS — H353211 Exudative age-related macular degeneration, right eye, with active choroidal neovascularization: Secondary | ICD-10-CM | POA: Diagnosis not present

## 2019-12-05 ENCOUNTER — Other Ambulatory Visit: Payer: Self-pay

## 2019-12-05 ENCOUNTER — Encounter: Payer: Self-pay | Admitting: Internal Medicine

## 2019-12-05 ENCOUNTER — Ambulatory Visit (INDEPENDENT_AMBULATORY_CARE_PROVIDER_SITE_OTHER): Payer: Medicare Other | Admitting: Internal Medicine

## 2019-12-05 VITALS — BP 118/80 | HR 78 | Temp 98.7°F | Resp 16 | Ht 61.25 in | Wt 202.0 lb

## 2019-12-05 DIAGNOSIS — F419 Anxiety disorder, unspecified: Secondary | ICD-10-CM | POA: Diagnosis not present

## 2019-12-05 DIAGNOSIS — E119 Type 2 diabetes mellitus without complications: Secondary | ICD-10-CM

## 2019-12-05 DIAGNOSIS — E039 Hypothyroidism, unspecified: Secondary | ICD-10-CM | POA: Diagnosis not present

## 2019-12-05 DIAGNOSIS — Z23 Encounter for immunization: Secondary | ICD-10-CM | POA: Diagnosis not present

## 2019-12-05 DIAGNOSIS — I10 Essential (primary) hypertension: Secondary | ICD-10-CM | POA: Diagnosis not present

## 2019-12-05 DIAGNOSIS — M549 Dorsalgia, unspecified: Secondary | ICD-10-CM

## 2019-12-05 DIAGNOSIS — E785 Hyperlipidemia, unspecified: Secondary | ICD-10-CM | POA: Diagnosis not present

## 2019-12-05 DIAGNOSIS — Z1159 Encounter for screening for other viral diseases: Secondary | ICD-10-CM

## 2019-12-05 DIAGNOSIS — Z0000 Encounter for general adult medical examination without abnormal findings: Secondary | ICD-10-CM | POA: Diagnosis not present

## 2019-12-05 DIAGNOSIS — Z13820 Encounter for screening for osteoporosis: Secondary | ICD-10-CM

## 2019-12-05 DIAGNOSIS — G8929 Other chronic pain: Secondary | ICD-10-CM

## 2019-12-05 DIAGNOSIS — G4733 Obstructive sleep apnea (adult) (pediatric): Secondary | ICD-10-CM

## 2019-12-05 DIAGNOSIS — K219 Gastro-esophageal reflux disease without esophagitis: Secondary | ICD-10-CM

## 2019-12-05 LAB — POCT GLYCOSYLATED HEMOGLOBIN (HGB A1C): Hemoglobin A1C: 6 % — AB (ref 4.0–5.6)

## 2019-12-05 NOTE — Patient Instructions (Signed)
-Nice seeing you today!!  -Lab work today; will notify you once results are available.  -Flu vaccine today.  -Will arrange cologuard and bone density test.  -Schedule follow up in 4 months.   Preventive Care 73 Years and Older, Female Preventive care refers to lifestyle choices and visits with your health care provider that can promote health and wellness. This includes:  A yearly physical exam. This is also called an annual well check.  Regular dental and eye exams.  Immunizations.  Screening for certain conditions.  Healthy lifestyle choices, such as diet and exercise. What can I expect for my preventive care visit? Physical exam Your health care provider will check:  Height and weight. These may be used to calculate body mass index (BMI), which is a measurement that tells if you are at a healthy weight.  Heart rate and blood pressure.  Your skin for abnormal spots. Counseling Your health care provider may ask you questions about:  Alcohol, tobacco, and drug use.  Emotional well-being.  Home and relationship well-being.  Sexual activity.  Eating habits.  History of falls.  Memory and ability to understand (cognition).  Work and work Statistician.  Pregnancy and menstrual history. What immunizations do I need?  Influenza (flu) vaccine  This is recommended every year. Tetanus, diphtheria, and pertussis (Tdap) vaccine  You may need a Td booster every 10 years. Varicella (chickenpox) vaccine  You may need this vaccine if you have not already been vaccinated. Zoster (shingles) vaccine  You may need this after age 73. Pneumococcal conjugate (PCV13) vaccine  One dose is recommended after age 73. Pneumococcal polysaccharide (PPSV23) vaccine  One dose is recommended after age 73. Measles, mumps, and rubella (MMR) vaccine  You may need at least one dose of MMR if you were born in 1957 or later. You may also need a second dose. Meningococcal conjugate  (MenACWY) vaccine  You may need this if you have certain conditions. Hepatitis A vaccine  You may need this if you have certain conditions or if you travel or work in places where you may be exposed to hepatitis A. Hepatitis B vaccine  You may need this if you have certain conditions or if you travel or work in places where you may be exposed to hepatitis B. Haemophilus influenzae type b (Hib) vaccine  You may need this if you have certain conditions. You may receive vaccines as individual doses or as more than one vaccine together in one shot (combination vaccines). Talk with your health care provider about the risks and benefits of combination vaccines. What tests do I need? Blood tests  Lipid and cholesterol levels. These may be checked every 5 years, or more frequently depending on your overall health.  Hepatitis C test.  Hepatitis B test. Screening  Lung cancer screening. You may have this screening every year starting at age 73 if you have a 30-pack-year history of smoking and currently smoke or have quit within the past 15 years.  Colorectal cancer screening. All adults should have this screening starting at age 73 and continuing until age 29. Your health care provider may recommend screening at age 73 if you are at increased risk. You will have tests every 1-10 years, depending on your results and the type of screening test.  Diabetes screening. This is done by checking your blood sugar (glucose) after you have not eaten for a while (fasting). You may have this done every 1-3 years.  Mammogram. This may be done every 73-2  years. Talk with your health care provider about how often you should have regular mammograms.  BRCA-related cancer screening. This may be done if you have a family history of breast, ovarian, tubal, or peritoneal cancers. Other tests  Sexually transmitted disease (STD) testing.  Bone density scan. This is done to screen for osteoporosis. You may have this  done starting at age 92. Follow these instructions at home: Eating and drinking  Eat a diet that includes fresh fruits and vegetables, whole grains, lean protein, and low-fat dairy products. Limit your intake of foods with high amounts of sugar, saturated fats, and salt.  Take vitamin and mineral supplements as recommended by your health care provider.  Do not drink alcohol if your health care provider tells you not to drink.  If you drink alcohol: ? Limit how much you have to 0-1 drink a day. ? Be aware of how much alcohol is in your drink. In the [place] one drink equals one 12 oz bottle of beer (355 mL), one 5 oz glass of wine (148 mL), or one 1 oz glass of hard liquor (44 mL). Lifestyle  Take daily care of your teeth and gums.  Stay active. Exercise for at least 30 minutes on 5 or more days each week.  Do not use any products that contain nicotine or tobacco, such as cigarettes, e-cigarettes, and chewing tobacco. If you need help quitting, ask your health care provider.  If you are sexually active, practice safe sex. Use a condom or other form of protection in order to prevent STIs (sexually transmitted infections).  Talk with your health care provider about taking a low-dose aspirin or statin. What's next?  Go to your health care provider once a year for a well check visit.  Ask your health care provider how often you should have your eyes and teeth checked.  Stay up to date on all vaccines. This information is not intended to replace advice given to you by your health care provider. Make sure you discuss any questions you have with your health care provider. Document Revised: 02/02/2018 Document Reviewed: 02/02/2018 Elsevier Patient Education  2020 [employer].

## 2019-12-05 NOTE — Progress Notes (Signed)
Established Patient Office Visit     This visit occurred during the SARS-CoV-2 public health emergency.  Safety protocols were in place, including screening questions prior to the visit, additional usage of staff PPE, and extensive cleaning of exam room while observing appropriate contact time as indicated for disinfecting solutions.    CC/Reason for Visit: Annual preventive exam and subsequent Medicare wellness visit  HPI: Katie Booker is a 74 y.o. female who is coming in today for the above mentioned reasons. Past Medical History is significant for: Spinal stenosis of cervical, thoracic, lumbar spine followed by Dr. Ellene Route and on chronic hydrocodone with the pain contract through me, anxiety on Xanax, insomnia on trazodone, diabetes, hypertension, hyperlipidemia, hypothyroidism.  She also had a left knee replacement in November 2019.  She is not needing refills today.  She has routine eye care but no dental care.  She is due for her flu vaccine, she had her Covid booster on September 29, she refuses colonoscopy but accepts Cologuard, her bone density is overdue, she had a mammogram in August.  She has no acute complaints today.   Past Medical/Surgical History: Past Medical History:  Diagnosis Date  . Age-related macular degeneration, wet, right eye ([place])    per pt has had treatment in past  . Anxiety   . Chronic constipation   . Chronic low back pain   . Chronic neck pain   . 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 Depression   . DOE (dyspnea on exertion)   . GERD   . Headache   . Hemorrhoids   . History of basal cell carcinoma excision    left cheek  . History of colon polyps   . History of hyperthyroidism 2011   RAI treatement  . History of panic attacks   . Hyperlipidemia   . Hypertension   . Hypothyroidism, postradioiodine therapy    followed by pcp  . IDA (iron deficiency anemia)    intermittant  . Insomnia    . OA (osteoarthritis)   . OSA (obstructive sleep apnea)    no cpap, per pt tried unable to tolerate  . Peripheral neuropathy    legs and feet from hx back surgery's  . Rash    bilateral axilla area -- per pt due to some personal wipes used other the counter  . RLS (restless legs syndrome)   . Spondylolisthesis at L1-L2 level   . Tingling of both upper extremities    left greater than right due to cervical pinched nerve  . Type 2 diabetes mellitus ([place])    followed by pcp  . Umbilical hernia   . Urinary frequency   . Urinary urgency     Past Surgical History:  Procedure Laterality Date  . ANTERIOR CERVICAL DECOMP/DISCECTOMY FUSION N/A 09/02/2014   Procedure: Cervical five- six  Anterior cervical decompression fusion;  Surgeon: Kristeen Miss, MD;  Location: [place] [facility];  Service: Neurosurgery;  Laterality: N/A;  C5-6 Anterior cervical decompression/diskectomy/fusion  . BREAST BIOPSY Right 10/2018   x 2 benign  . CARPAL TUNNEL RELEASE Right 2016  . CATARACT EXTRACTION W/ INTRAOCULAR LENS  IMPLANT, BILATERAL  03/2015  . COLONOSCOPY  2007  . ESOPHAGOGASTRODUODENOSCOPY  2007  . HEMORRHOID SURGERY  03/23/2011   Procedure: HEMORRHOIDECTOMY;  Surgeon: Joyice Faster. Cornett, MD;  Location: [facility];  Service: General;  Laterality: N/A;  lateral internal sphincterotomy and hemorrhoidectomy  .  LUMBAR FUSION  02/2015   L1 -- 2  . PARTIAL KNEE ARTHROPLASTY Left 12/27/2017   Procedure: UNICOMPARTMENTAL LEFT KNEE;  Surgeon: Marchia Bond, MD;  Location: [facility];  Service: Orthopedics;  Laterality: Left;  . THORACIC DISCECTOMY N/A 12/15/2012   Procedure: Thoracic ten-eleven Thoracic laminectomy;  Surgeon: Kristeen Miss, MD;  Location: [place] [facility];  Service: Neurosurgery;  Laterality: N/A;  Thoracic ten-eleven Thoracic laminectomy  . TUBAL LIGATION Bilateral 1991    Social History:  reports that she quit smoking about 15 years ago. Her smoking use included cigarettes. She has a  20.00 pack-year smoking history. She has never used smokeless tobacco. She reports current alcohol use. She reports that she does not use drugs.  Allergies: Allergies  Allergen Reactions  . Duraprep [employer], Misc.] Itching and Rash    Irritation everywhere prep was used  . Betadine [Povidone Iodine] Other (See Comments)    Burning sensation.   . Hydromorphone Hcl Other (See Comments)    Makes crazy  . Morphine And Related Other (See Comments)    hallucinations    Family History:  Family History  Problem Relation Age of Onset  . Diabetes Father   . COPD Father   . Cancer Maternal Grandfather        stomach     Current Outpatient Medications:  .  ALPRAZolam (XANAX) 0.5 MG tablet, Take 1 tablet (0.5 mg total) by mouth 2 (two) times daily as needed. for anxiety, Disp: 60 tablet, Rfl: 3 .  atenolol (TENORMIN) 25 MG tablet, TAKE 1 TABLET BY MOUTH  DAILY, Disp: 90 tablet, Rfl: 1 .  atorvastatin (LIPITOR) 40 MG tablet, TAKE 1 TABLET BY MOUTH  DAILY, Disp: 90 tablet, Rfl: 1 .  Calcium Carb-Cholecalciferol (CALCIUM 600/VITAMIN D3 PO), Take 1 tablet by mouth 2 (two) times daily., Disp: , Rfl:  .  Carboxymethylcellulose Sodium (LUBRICANT EYE DROPS OP), Apply 2 drops to eye 4 (four) times daily as needed (dry eyes). , Disp: , Rfl:  .  citalopram (CELEXA) 40 MG tablet, TAKE 1 TABLET BY MOUTH  DAILY, Disp: 90 tablet, Rfl: 1 .  docusate sodium (COLACE) 100 MG capsule, Take 200 mg by mouth at bedtime. , Disp: , Rfl:  .  estradiol (ESTRACE) 0.1 MG/GM vaginal cream, PLACE 1 APPLICATORFUL  VAGINALLY 3 TIMES A WEEK., Disp: 170 g, Rfl: 3 .  gemfibrozil (LOPID) 600 MG tablet, TAKE 1 TABLET BY MOUTH  TWICE DAILY BEFORE MEALS, Disp: 180 tablet, Rfl: 3 .  HYDROcodone-acetaminophen (NORCO) 7.5-325 MG tablet, Take 1 tablet by mouth every 6 (six) hours as needed for moderate pain., Disp: 120 tablet, Rfl: 0 .  HYDROcodone-acetaminophen (NORCO) 7.5-325 MG tablet, Take 1 tablet by mouth every 6  (six) hours as needed for moderate pain., Disp: 120 tablet, Rfl: 0 .  HYDROcodone-acetaminophen (NORCO) 7.5-325 MG tablet, Take 1 tablet by mouth every 6 (six) hours as needed for moderate pain., Disp: 120 tablet, Rfl: 0 .  hydrocortisone 2.5 % cream, APPLY TO AFFECTED AREA(S)  TOPICALLY TWICE DAILY, Disp: 85.05 g, Rfl: 0 .  Lancets (ONETOUCH ULTRASOFT) lancets, Use daily, Disp: 100 each, Rfl: 6 .  levothyroxine (SYNTHROID) 150 MCG tablet, TAKE 1 TABLET BY MOUTH  DAILY, Disp: 90 tablet, Rfl: 3 .  lisinopril (ZESTRIL) 10 MG tablet, TAKE 1 TABLET BY MOUTH  DAILY, Disp: 90 tablet, Rfl: 1 .  loratadine (CLARITIN) 10 MG tablet, Take 10 mg by mouth daily as needed for allergies., Disp: , Rfl:  .  meloxicam (MOBIC)  15 MG tablet, TAKE 1 TABLET BY MOUTH  DAILY, Disp: 90 tablet, Rfl: 1 .  metFORMIN (GLUCOPHAGE) 1000 MG tablet, TAKE 1 TABLET BY MOUTH TWO  TIMES DAILY WITH MEALS, Disp: 180 tablet, Rfl: 3 .  Multiple Vitamin (MULTIVITAMIN) tablet, Take 1 tablet by mouth daily., Disp: , Rfl:  .  Omega-3 Fatty Acids (FISH OIL) 1200 MG CAPS, Take 1 capsule by mouth daily. , Disp: , Rfl:  .  ONE TOUCH ULTRA TEST test strip, USE 1 DAILY AS DIRECTED, Disp: 100 each, Rfl: 3 .  pantoprazole (PROTONIX) 40 MG tablet, TAKE 1 TABLET BY MOUTH  TWICE DAILY BEFORE MEALS, Disp: 180 tablet, Rfl: 1 .  pioglitazone (ACTOS) 15 MG tablet, TAKE 1 TABLET BY MOUTH  DAILY, Disp: 90 tablet, Rfl: 1 .  pregabalin (LYRICA) 75 MG capsule, TAKE 1 CAPSULE BY MOUTH  DAILY, Disp: 90 capsule, Rfl: 1 .  rOPINIRole (REQUIP) 1 MG tablet, TAKE 1 TABLET BY MOUTH AT  BEDTIME, Disp: 90 tablet, Rfl: 3 .  sennosides-docusate sodium (SENOKOT-S) 8.6-50 MG tablet, Take 2 tablets by mouth daily., Disp: 30 tablet, Rfl: 1 .  tiZANidine (ZANAFLEX) 4 MG capsule, Take 1 capsule (4 mg total) by mouth 3 (three) times daily., Disp: 180 capsule, Rfl: 1 .  tiZANidine (ZANAFLEX) 4 MG tablet, TAKE 1 TABLET BY MOUTH 3  TIMES DAILY AS NEEDED, Disp: 180 tablet, Rfl: 1 .   tolnaftate (TINACTIN) 1 % spray, Apply topically 2 (two) times daily as needed., Disp: 130 g, Rfl: 0 .  traZODone (DESYREL) 100 MG tablet, TAKE 1 TO 2 TABLETS BY  MOUTH AT BEDTIME, Disp: 180 tablet, Rfl: 3 .  tobramycin (TOBREX) 0.3 % ophthalmic solution, , Disp: , Rfl:   Review of Systems:  Constitutional: Denies fever, chills, diaphoresis, appetite change and fatigue.  HEENT: Denies photophobia, eye pain, redness, hearing loss, ear pain, congestion, sore throat, rhinorrhea, sneezing, mouth sores, trouble swallowing, neck pain, neck stiffness and tinnitus.   Respiratory: Denies SOB, DOE, cough, chest tightness,  and wheezing.   Cardiovascular: Denies chest pain, palpitations and leg swelling.  Gastrointestinal: Denies nausea, vomiting, abdominal pain, diarrhea, constipation, blood in stool and abdominal distention.  Genitourinary: Denies dysuria, urgency, frequency, hematuria, flank pain and difficulty urinating.  Endocrine: Denies: hot or cold intolerance, sweats, changes in hair or nails, polyuria, polydipsia. Musculoskeletal: Denies myalgias, back pain, joint swelling, arthralgias and gait problem.  Skin: Denies pallor, rash and wound.  Neurological: Denies dizziness, seizures, syncope, weakness, light-headedness, numbness and headaches.  Hematological: Denies adenopathy. Easy bruising, personal or family bleeding history  Psychiatric/Behavioral: Denies suicidal ideation, mood changes, confusion, nervousness, sleep disturbance and agitation    Physical Exam: Vitals:   12/05/19 0828  BP: 118/80  Pulse: 78  Resp: 16  Temp: 98.7 F (37.1 C)  TempSrc: Oral  SpO2: 96%  Weight: 202 lb (91.6 kg)  Height: 5' 1.25" (1.556 m)    Body mass index is 37.86 kg/m.   Constitutional: NAD, calm, comfortable, obese Eyes: PERRL, lids and conjunctivae normal ENMT: Mucous membranes are moist. Posterior pharynx clear of any exudate or lesions. Normal dentition. Tympanic membrane is pearly white,  no erythema or bulging. Neck: normal, supple, no masses, no thyromegaly Respiratory: clear to auscultation bilaterally, no wheezing, no crackles. Normal respiratory effort. No accessory muscle use.  Cardiovascular: Regular rate and rhythm, no murmurs / rubs / gallops. No extremity edema. 2+ pedal pulses. No carotid bruits.  Abdomen: no tenderness, no masses palpated. No hepatosplenomegaly. Bowel sounds positive.  Musculoskeletal:  no clubbing / cyanosis. No joint deformity upper and lower extremities. Good ROM, no contractures. Normal muscle tone.  Skin: no rashes, lesions, ulcers. No induration Neurologic: CN 2-12 grossly intact. Sensation intact, DTR normal. Strength 5/5 in all 4.  Psychiatric: Normal judgment and insight. Alert and oriented x 3. Normal mood.   Subsequent Medicare wellness visit   1. Risk factors, based on past  M,S,F -cardiovascular disease risk factors include age, history of hypertension, history of hyperlipidemia, history of type 2 diabetes   2.  Physical activities: She is very sedentary   3.  Depression/mood:  Mild depression   4.  Hearing:  No perceived issues   5.  ADL's: Independent in all ADLs   6.  Fall risk:  Moderate fall risk   7.  Home safety: No problems identified   8.  Height weight, and visual acuity: height and weight as above, vision:   Visual Acuity Screening   Right eye Left eye Both eyes  Without correction: 20/25 20/15 20/15   With correction:        9.  Counseling:  Continue to discuss weight loss   10. Lab orders based on risk factors: Laboratory update will be reviewed   11. Referral :  None today   12. Care plan:  Follow-up with me in 4 months   13. Cognitive assessment:  No cognitive impairment   14. Screening: Patient provided with a written and personalized 5-10 year screening schedule in the AVS.   yes   15. Provider List Update:   PCP, Dr. Ellene Route  16. Advance Directives: Full code     Office Visit from 12/05/2019 in  [place] at [facility] Total Score 13      Fall Risk  12/05/2019 08/15/2018 01/13/2018 08/02/2016 01/28/2015  Falls in the past year? 1 0 0 No No  Number falls in past yr: 0 0 0 - -  Injury with Fall? 1 0 0 - -  Comment Bruise on leg - - - -     Impression and Plan:  Encounter for preventive health examination -Advised routine eye and dental care. -Flu vaccine today, otherwise immunizations are up-to-date. -Screening labs today. -Healthy lifestyle has been discussed in detail. -She had a mammogram in August 2021. -She refuses colonoscopy but has agreed to a Cologuard, I will set this up. -DEXA scan has been requested. -Declined cervical cancer screening.  Diabetes mellitus without complication ([place])  - Lab Results  Component Value Date   HGBA1C 6.0 (A) 12/05/2019   -Well-controlled.  Morbid obesity ([place]) -Discussed healthy lifestyle, including increased physical activity and better food choices to promote weight loss.  Anxiety -On alprazolam but not requesting refills today.  Other chronic back pain -She has a chronic pain contract with me for hydrocodone but she is not requesting refills today.  Dyslipidemia  - Plan: Lipid panel, Lipid panel -Last LDL was 70 in June 2020, she is on atorvastatin daily.  Essential hypertension  -Well-controlled on current regimen consisting of atenolol, lisinopril.  Gastroesophageal reflux disease without esophagitis -Well-controlled on daily PPI therapy, she is on Protonix twice daily.  Acquired hypothyroidism  - Plan: TSH, TSH  OSA (obstructive sleep apnea) -On CPAP  Need for influenza vaccination -Flu vaccine administered today.  Need for hepatitis C screening test  - Plan: Hep C Antibody, Hep C Antibody  Screening for osteoporosis  - Plan: DG Bone Density  Need for immunization against influenza  - Plan: Flu Vaccine QUAD High Dose(Fluad)  Patient Instructions  -Nice seeing you today!!  -Lab  work today; will notify you once results are available.  -Flu vaccine today.  -Will arrange cologuard and bone density test.  -Schedule follow up in 4 months.   Preventive Care 20 Years and Older, Female Preventive care refers to lifestyle choices and visits with your health care provider that can promote health and wellness. This includes:  A yearly physical exam. This is also called an annual well check.  Regular dental and eye exams.  Immunizations.  Screening for certain conditions.  Healthy lifestyle choices, such as diet and exercise. What can I expect for my preventive care visit? Physical exam Your health care provider will check:  Height and weight. These may be used to calculate body mass index (BMI), which is a measurement that tells if you are at a healthy weight.  Heart rate and blood pressure.  Your skin for abnormal spots. Counseling Your health care provider may ask you questions about:  Alcohol, tobacco, and drug use.  Emotional well-being.  Home and relationship well-being.  Sexual activity.  Eating habits.  History of falls.  Memory and ability to understand (cognition).  Work and work Statistician.  Pregnancy and menstrual history. What immunizations do I need?  Influenza (flu) vaccine  This is recommended every year. Tetanus, diphtheria, and pertussis (Tdap) vaccine  You may need a Td booster every 10 years. Varicella (chickenpox) vaccine  You may need this vaccine if you have not already been vaccinated. Zoster (shingles) vaccine  You may need this after age 38. Pneumococcal conjugate (PCV13) vaccine  One dose is recommended after age 81. Pneumococcal polysaccharide (PPSV23) vaccine  One dose is recommended after age 71. Measles, mumps, and rubella (MMR) vaccine  You may need at least one dose of MMR if you were born in 1957 or later. You may also need a second dose. Meningococcal conjugate (MenACWY) vaccine  You may need  this if you have certain conditions. Hepatitis A vaccine  You may need this if you have certain conditions or if you travel or work in places where you may be exposed to hepatitis A. Hepatitis B vaccine  You may need this if you have certain conditions or if you travel or work in places where you may be exposed to hepatitis B. Haemophilus influenzae type b (Hib) vaccine  You may need this if you have certain conditions. You may receive vaccines as individual doses or as more than one vaccine together in one shot (combination vaccines). Talk with your health care provider about the risks and benefits of combination vaccines. What tests do I need? Blood tests  Lipid and cholesterol levels. These may be checked every 5 years, or more frequently depending on your overall health.  Hepatitis C test.  Hepatitis B test. Screening  Lung cancer screening. You may have this screening every year starting at age 48 if you have a 30-pack-year history of smoking and currently smoke or have quit within the past 15 years.  Colorectal cancer screening. All adults should have this screening starting at age 50 and continuing until age 104. Your health care provider may recommend screening at age 73 if you are at increased risk. You will have tests every 1-10 years, depending on your results and the type of screening test.  Diabetes screening. This is done by checking your blood sugar (glucose) after you have not eaten for a while (fasting). You may have this done every 1-3 years.  Mammogram. This may  be done every 1-2 years. Talk with your health care provider about how often you should have regular mammograms.  BRCA-related cancer screening. This may be done if you have a family history of breast, ovarian, tubal, or peritoneal cancers. Other tests  Sexually transmitted disease (STD) testing.  Bone density scan. This is done to screen for osteoporosis. You may have this done starting at age 62. Follow  these instructions at home: Eating and drinking  Eat a diet that includes fresh fruits and vegetables, whole grains, lean protein, and low-fat dairy products. Limit your intake of foods with high amounts of sugar, saturated fats, and salt.  Take vitamin and mineral supplements as recommended by your health care provider.  Do not drink alcohol if your health care provider tells you not to drink.  If you drink alcohol: ? Limit how much you have to 0-1 drink a day. ? Be aware of how much alcohol is in your drink. In the [place] one drink equals one 12 oz bottle of beer (355 mL), one 5 oz glass of wine (148 mL), or one 1 oz glass of hard liquor (44 mL). Lifestyle  Take daily care of your teeth and gums.  Stay active. Exercise for at least 30 minutes on 5 or more days each week.  Do not use any products that contain nicotine or tobacco, such as cigarettes, e-cigarettes, and chewing tobacco. If you need help quitting, ask your health care provider.  If you are sexually active, practice safe sex. Use a condom or other form of protection in order to prevent STIs (sexually transmitted infections).  Talk with your health care provider about taking a low-dose aspirin or statin. What's next?  Go to your health care provider once a year for a well check visit.  Ask your health care provider how often you should have your eyes and teeth checked.  Stay up to date on all vaccines. This information is not intended to replace advice given to you by your health care provider. Make sure you discuss any questions you have with your health care provider. Document Revised: 02/02/2018 Document Reviewed: 02/02/2018 Elsevier Patient Education  2020 [place], [place] [facility]

## 2019-12-06 ENCOUNTER — Other Ambulatory Visit: Payer: Self-pay | Admitting: Internal Medicine

## 2019-12-06 DIAGNOSIS — E039 Hypothyroidism, unspecified: Secondary | ICD-10-CM

## 2019-12-06 DIAGNOSIS — E559 Vitamin D deficiency, unspecified: Secondary | ICD-10-CM

## 2019-12-06 LAB — CBC WITH DIFFERENTIAL/PLATELET
Absolute Monocytes: 592 cells/uL (ref 200–950)
Basophils Absolute: 30 cells/uL (ref 0–200)
Basophils Relative: 0.4 %
Eosinophils Absolute: 311 cells/uL (ref 15–500)
Eosinophils Relative: 4.2 %
HCT: 35.8 % (ref 35.0–45.0)
Hemoglobin: 11.8 g/dL (ref 11.7–15.5)
Lymphs Abs: 2272 cells/uL (ref 850–3900)
MCH: 28.2 pg (ref 27.0–33.0)
MCHC: 33 g/dL (ref 32.0–36.0)
MCV: 85.4 fL (ref 80.0–100.0)
MPV: 10 fL (ref 7.5–12.5)
Monocytes Relative: 8 %
Neutro Abs: 4196 cells/uL (ref 1500–7800)
Neutrophils Relative %: 56.7 %
Platelets: 189 10*3/uL (ref 140–400)
RBC: 4.19 10*6/uL (ref 3.80–5.10)
RDW: 12.8 % (ref 11.0–15.0)
Total Lymphocyte: 30.7 %
WBC: 7.4 10*3/uL (ref 3.8–10.8)

## 2019-12-06 LAB — TSH: TSH: 0.23 mIU/L — ABNORMAL LOW (ref 0.40–4.50)

## 2019-12-06 LAB — COMPREHENSIVE METABOLIC PANEL
AG Ratio: 2 (calc) (ref 1.0–2.5)
ALT: 21 U/L (ref 6–29)
AST: 22 U/L (ref 10–35)
Albumin: 4.2 g/dL (ref 3.6–5.1)
Alkaline phosphatase (APISO): 73 U/L (ref 37–153)
BUN/Creatinine Ratio: 25 (calc) — ABNORMAL HIGH (ref 6–22)
BUN: 31 mg/dL — ABNORMAL HIGH (ref 7–25)
CO2: 25 mmol/L (ref 20–32)
Calcium: 9.4 mg/dL (ref 8.6–10.4)
Chloride: 103 mmol/L (ref 98–110)
Creat: 1.25 mg/dL — ABNORMAL HIGH (ref 0.60–0.93)
Globulin: 2.1 g/dL (calc) (ref 1.9–3.7)
Glucose, Bld: 83 mg/dL (ref 65–99)
Potassium: 5.6 mmol/L — ABNORMAL HIGH (ref 3.5–5.3)
Sodium: 138 mmol/L (ref 135–146)
Total Bilirubin: 0.3 mg/dL (ref 0.2–1.2)
Total Protein: 6.3 g/dL (ref 6.1–8.1)

## 2019-12-06 LAB — VITAMIN D 25 HYDROXY (VIT D DEFICIENCY, FRACTURES): Vit D, 25-Hydroxy: 39 ng/mL (ref 30–100)

## 2019-12-06 LAB — HEPATITIS C ANTIBODY
Hepatitis C Ab: NONREACTIVE
SIGNAL TO CUT-OFF: 0.01 (ref <1.00)

## 2019-12-06 LAB — LIPID PANEL
Cholesterol: 157 mg/dL (ref <200)
HDL: 62 mg/dL (ref >=50)
LDL Cholesterol (Calc): 68 mg/dL (calc)
Non-HDL Cholesterol (Calc): 95 mg/dL (calc) (ref <130)
Total CHOL/HDL Ratio: 2.5 (calc) (ref <5.0)
Triglycerides: 203 mg/dL — ABNORMAL HIGH (ref <150)

## 2019-12-06 LAB — VITAMIN B12: Vitamin B-12: 506 pg/mL (ref 200–1100)

## 2019-12-06 MED ORDER — VITAMIN D (ERGOCALCIFEROL) 1.25 MG (50000 UNIT) PO CAPS: 50000.0000 [IU] | ORAL_CAPSULE | ORAL | 0 refills | Status: DC

## 2019-12-06 MED ORDER — LEVOTHYROXINE SODIUM 137 MCG PO TABS: 137.0000 ug | ORAL_TABLET | Freq: Every day | ORAL | 1 refills | Status: DC

## 2019-12-12 ENCOUNTER — Telehealth: Payer: Self-pay | Admitting: *Deleted

## 2019-12-12 NOTE — Telephone Encounter (Signed)
See lab result note.

## 2019-12-12 NOTE — Telephone Encounter (Signed)
Patient states she is returning the nurses call.

## 2019-12-13 ENCOUNTER — Telehealth: Payer: Self-pay | Admitting: Internal Medicine

## 2019-12-13 DIAGNOSIS — E039 Hypothyroidism, unspecified: Secondary | ICD-10-CM

## 2019-12-13 DIAGNOSIS — E559 Vitamin D deficiency, unspecified: Secondary | ICD-10-CM

## 2019-12-13 MED ORDER — VITAMIN D (ERGOCALCIFEROL) 1.25 MG (50000 UNIT) PO CAPS: 50000.0000 [IU] | ORAL_CAPSULE | ORAL | 0 refills | Status: DC

## 2019-12-13 MED ORDER — LEVOTHYROXINE SODIUM 137 MCG PO TABS: 137.0000 ug | ORAL_TABLET | Freq: Every day | ORAL | 1 refills | Status: DC

## 2019-12-13 NOTE — Telephone Encounter (Signed)
Pt is calling in stating that she would like to have her Vit-D and the new strength of levothyroxine (SYNTHROID) 137 MCG to be sent to her local pharmacy #[id number] for both.  [facility] [address] [employer] [employer] [facility] and please please cancel the [facility] per the pt.

## 2019-12-13 NOTE — Telephone Encounter (Signed)
New Rx sent.

## 2019-12-18 ENCOUNTER — Other Ambulatory Visit: Payer: Self-pay | Admitting: Internal Medicine

## 2019-12-20 ENCOUNTER — Other Ambulatory Visit: Payer: Self-pay | Admitting: Internal Medicine

## 2019-12-20 DIAGNOSIS — M48062 Spinal stenosis, lumbar region with neurogenic claudication: Secondary | ICD-10-CM

## 2019-12-20 NOTE — Telephone Encounter (Signed)
HYDROcodone-acetaminophen (NORCO) 7.5-325 MG tablet   [id number] [place], [place] - [address] Phone:  [phone]  Fax:  [phone]

## 2019-12-25 ENCOUNTER — Telehealth: Payer: Self-pay | Admitting: Internal Medicine

## 2019-12-25 ENCOUNTER — Encounter: Payer: Self-pay | Admitting: Internal Medicine

## 2019-12-25 DIAGNOSIS — E119 Type 2 diabetes mellitus without complications: Secondary | ICD-10-CM

## 2019-12-25 DIAGNOSIS — M48062 Spinal stenosis, lumbar region with neurogenic claudication: Secondary | ICD-10-CM

## 2019-12-25 MED ORDER — HYDROCODONE-ACETAMINOPHEN 7.5-325 MG PO TABS: 1.0000 | ORAL_TABLET | Freq: Four times a day (QID) | ORAL | 0 refills | Status: DC | PRN

## 2019-12-25 NOTE — Telephone Encounter (Signed)
Pt is calling in stating that she need the below medication called in today due to her being out and is in a lot of pain. Pt is aware of the 24-72 hr protocol for medication refills. Pharm[facility].

## 2019-12-25 NOTE — Telephone Encounter (Signed)
Pt is calling in to cancel her appointment on 12/26/2019 due to her transportation (car died) and pt would like to only have labs done at this time.  May I have orders for labs?

## 2019-12-25 NOTE — Addendum Note (Signed)
Addended by: Kern Reap B on: 12/25/2019 04:16 PM   Modules accepted: Orders

## 2019-12-25 NOTE — Telephone Encounter (Signed)
1. Hep C negative.  2. Vit D is low normal: 16384 IU weekly x 12 weeks with follow up levels then. Will send Rx.  3. TSH is low (over functioning). Decrease levothyroxine from 150 to 137 mcg daily and recheck TSH in 6 weeks (Rx sent).  4. Renal function is worse and K is high. Stop lisinopril. Drink plenty of fluids and return for repeat BMET in 2 weeks.  Okay to order labs?

## 2019-12-25 NOTE — Telephone Encounter (Signed)
Patient had an office visit 12/05/19.

## 2019-12-25 NOTE — Telephone Encounter (Signed)
Yes, looks like only BMET may be needed? Too early for TSH and vit D recheck?

## 2019-12-25 NOTE — Telephone Encounter (Signed)
Spoke with patient and she will call back to reschedule

## 2019-12-26 ENCOUNTER — Ambulatory Visit: Payer: Self-pay | Admitting: Internal Medicine

## 2019-12-26 ENCOUNTER — Telehealth: Payer: Self-pay | Admitting: Internal Medicine

## 2019-12-26 MED ORDER — HYDROCODONE-ACETAMINOPHEN 7.5-325 MG PO TABS: 1.0000 | ORAL_TABLET | Freq: Four times a day (QID) | ORAL | 0 refills | Status: DC | PRN

## 2019-12-26 NOTE — Telephone Encounter (Signed)
Pt is calling in stating that her hydrocodone-acetamionphen (NORCO) 7.5-325 MG [facility] and this is the only Rx that should be going to this pharmacy.  [facility] [address] [facility]

## 2019-12-26 NOTE — Telephone Encounter (Signed)
Rx already sent.

## 2019-12-27 ENCOUNTER — Encounter: Payer: Self-pay | Admitting: Internal Medicine

## 2019-12-28 MED ORDER — TIZANIDINE HCL 4 MG PO CAPS
4.0000 mg | ORAL_CAPSULE | Freq: Three times a day (TID) | ORAL | 1 refills | Status: DC
Start: 2019-12-28 — End: 2020-01-09

## 2020-01-01 ENCOUNTER — Other Ambulatory Visit: Payer: Self-pay | Admitting: *Deleted

## 2020-01-06 ENCOUNTER — Other Ambulatory Visit: Payer: Self-pay | Admitting: Internal Medicine

## 2020-01-06 DIAGNOSIS — F419 Anxiety disorder, unspecified: Secondary | ICD-10-CM

## 2020-01-09 ENCOUNTER — Encounter: Payer: Self-pay | Admitting: Internal Medicine

## 2020-01-09 DIAGNOSIS — G8929 Other chronic pain: Secondary | ICD-10-CM

## 2020-01-10 ENCOUNTER — Other Ambulatory Visit: Payer: Self-pay | Admitting: Internal Medicine

## 2020-01-10 DIAGNOSIS — G4700 Insomnia, unspecified: Secondary | ICD-10-CM

## 2020-01-10 MED ORDER — PREGABALIN 75 MG PO CAPS: 75.0000 mg | ORAL_CAPSULE | Freq: Every day | ORAL | 1 refills | Status: DC

## 2020-01-10 MED ORDER — TIZANIDINE HCL 4 MG PO CAPS
4.0000 mg | ORAL_CAPSULE | Freq: Three times a day (TID) | ORAL | 1 refills | Status: DC
Start: 2020-01-10 — End: 2020-05-12

## 2020-01-23 DIAGNOSIS — H353123 Nonexudative age-related macular degeneration, left eye, advanced atrophic without subfoveal involvement: Secondary | ICD-10-CM | POA: Diagnosis not present

## 2020-01-23 DIAGNOSIS — H43813 Vitreous degeneration, bilateral: Secondary | ICD-10-CM | POA: Diagnosis not present

## 2020-01-23 DIAGNOSIS — H35433 Paving stone degeneration of retina, bilateral: Secondary | ICD-10-CM | POA: Diagnosis not present

## 2020-01-23 DIAGNOSIS — H353211 Exudative age-related macular degeneration, right eye, with active choroidal neovascularization: Secondary | ICD-10-CM | POA: Diagnosis not present

## 2020-01-29 DIAGNOSIS — Z1211 Encounter for screening for malignant neoplasm of colon: Secondary | ICD-10-CM | POA: Diagnosis not present

## 2020-01-30 ENCOUNTER — Encounter: Payer: Self-pay | Admitting: Internal Medicine

## 2020-01-31 ENCOUNTER — Encounter: Payer: Self-pay | Admitting: Internal Medicine

## 2020-02-01 MED ORDER — TIZANIDINE HCL 4 MG PO TABS
4.0000 mg | ORAL_TABLET | Freq: Three times a day (TID) | ORAL | 1 refills | Status: DC | PRN
Start: 2020-02-01 — End: 2020-06-10

## 2020-02-02 ENCOUNTER — Other Ambulatory Visit: Payer: Self-pay | Admitting: Internal Medicine

## 2020-02-02 DIAGNOSIS — E559 Vitamin D deficiency, unspecified: Secondary | ICD-10-CM

## 2020-02-05 ENCOUNTER — Encounter: Payer: Self-pay | Admitting: Internal Medicine

## 2020-02-07 ENCOUNTER — Telehealth: Payer: Self-pay | Admitting: Internal Medicine

## 2020-02-07 NOTE — Progress Notes (Signed)
  Chronic Care Management   Outreach Note  02/07/2020 Name: Katie Booker MRN: [id number] DOB: 1945-12-12  Referred by: Philip Aspen, Limmie Patricia, MD Reason for referral : No chief complaint on file.   An unsuccessful telephone outreach was attempted today. The patient was referred to the pharmacist for assistance with care management and care coordination.   Follow Up Plan:   Carley Perdue [employer] Scheduler

## 2020-02-08 ENCOUNTER — Telehealth: Payer: Self-pay | Admitting: Internal Medicine

## 2020-02-08 NOTE — Progress Notes (Signed)
  Chronic Care Management   Note  02/08/2020 Name: Katie Booker MRN: [id number] DOB: 24-Jun-1945  Katie Booker is a 74 y.o. year old female who is a primary care patient of Philip Aspen, Limmie Patricia, MD. I reached out to Katie Booker by phone today in response to a referral sent by Katie Booker's PCP, Philip Aspen, Limmie Patricia, MD.   Katie Booker was given information about Chronic Care Management services today including:  1. CCM service includes personalized support from designated clinical staff supervised by her physician, including individualized plan of care and coordination with other care providers 2. 24/7 contact phone numbers for assistance for urgent and routine care needs. 3. Service will only be billed when office clinical staff spend 20 minutes or more in a month to coordinate care. 4. Only one practitioner may furnish and bill the service in a calendar month. 5. The patient may stop CCM services at any time (effective at the end of the month) by phone call to the office staff.   Patient agreed to services and verbal consent obtained.   Follow up plan:   Katie Booker [employer] Scheduler

## 2020-02-08 NOTE — Progress Notes (Signed)
  Chronic Care Management   Outreach Note  02/08/2020 Name: Katie Booker MRN: [id number] DOB: 12/21/1945  Referred by: Philip Aspen, Limmie Patricia, MD Reason for referral : No chief complaint on file.   A second unsuccessful telephone outreach was attempted today. The patient was referred to pharmacist for assistance with care management and care coordination.  Follow Up Plan:   Carley Perdue [employer] Scheduler

## 2020-02-14 ENCOUNTER — Other Ambulatory Visit: Payer: Self-pay | Admitting: Internal Medicine

## 2020-02-28 ENCOUNTER — Other Ambulatory Visit: Payer: Self-pay | Admitting: Internal Medicine

## 2020-03-18 ENCOUNTER — Encounter: Payer: Self-pay | Admitting: Internal Medicine

## 2020-03-19 DIAGNOSIS — H35372 Puckering of macula, left eye: Secondary | ICD-10-CM | POA: Diagnosis not present

## 2020-03-19 DIAGNOSIS — H353123 Nonexudative age-related macular degeneration, left eye, advanced atrophic without subfoveal involvement: Secondary | ICD-10-CM | POA: Diagnosis not present

## 2020-03-19 DIAGNOSIS — H43813 Vitreous degeneration, bilateral: Secondary | ICD-10-CM | POA: Diagnosis not present

## 2020-03-19 DIAGNOSIS — H353211 Exudative age-related macular degeneration, right eye, with active choroidal neovascularization: Secondary | ICD-10-CM | POA: Diagnosis not present

## 2020-03-20 ENCOUNTER — Other Ambulatory Visit: Payer: Self-pay | Admitting: Internal Medicine

## 2020-03-21 NOTE — Telephone Encounter (Signed)
Lisinopril not on list. Okay to refill?

## 2020-03-24 NOTE — Telephone Encounter (Signed)
Pt is calling in stating that she is out of her Rx Hydrocodone-acetaminophen (NORCO) 7.5-325 MG would like to have a it called in please.   [facility] [address]

## 2020-03-25 ENCOUNTER — Encounter: Payer: Self-pay | Admitting: Internal Medicine

## 2020-03-26 ENCOUNTER — Encounter: Payer: Self-pay | Admitting: Internal Medicine

## 2020-03-26 ENCOUNTER — Telehealth (INDEPENDENT_AMBULATORY_CARE_PROVIDER_SITE_OTHER): Payer: Medicare Other | Admitting: Internal Medicine

## 2020-03-26 ENCOUNTER — Encounter: Payer: Self-pay | Admitting: *Deleted

## 2020-03-26 VITALS — Temp 98.4°F | Wt 191.0 lb

## 2020-03-26 DIAGNOSIS — M48062 Spinal stenosis, lumbar region with neurogenic claudication: Secondary | ICD-10-CM

## 2020-03-26 DIAGNOSIS — E559 Vitamin D deficiency, unspecified: Secondary | ICD-10-CM

## 2020-03-26 DIAGNOSIS — G8929 Other chronic pain: Secondary | ICD-10-CM | POA: Diagnosis not present

## 2020-03-26 DIAGNOSIS — Z13820 Encounter for screening for osteoporosis: Secondary | ICD-10-CM | POA: Diagnosis not present

## 2020-03-26 DIAGNOSIS — M549 Dorsalgia, unspecified: Secondary | ICD-10-CM

## 2020-03-26 DIAGNOSIS — E039 Hypothyroidism, unspecified: Secondary | ICD-10-CM

## 2020-03-26 DIAGNOSIS — I10 Essential (primary) hypertension: Secondary | ICD-10-CM | POA: Diagnosis not present

## 2020-03-26 MED ORDER — HYDROCODONE-ACETAMINOPHEN 7.5-325 MG PO TABS: 1.0000 | ORAL_TABLET | Freq: Four times a day (QID) | ORAL | 0 refills | Status: DC | PRN

## 2020-03-26 NOTE — Progress Notes (Signed)
Virtual Visit via Video Note  I connected with Katie Booker on 03/26/20 at  3:00 PM EST by a video enabled telemedicine application and verified that I am speaking with the correct person using two identifiers.  Location patient: home Location provider: work office Persons participating in the virtual visit: patient, provider  I discussed the limitations of evaluation and management by telemedicine and the availability of in person appointments. The patient expressed understanding and agreed to proceed.   HPI: This visit has been scheduled mainly for medication refill per opioid contract.  She takes hydrocodone 7.5/325 mg every 6 hours as needed for pain.  She is typically prescribed 120 mg tablets monthly.  She has been doing well and it is due for refills.  At her physical in October she was diagnosed with vitamin D deficiency.  She tells me she quit taking the vitamin D after 3 weeks because of dizziness.  Her thyroid medication dose was changed and she was asked to hold on her lisinopril due to some worsening creatinine and hyperkalemia.  She has not returned for lab work.  She is feeling well.   ROS: Constitutional: Denies fever, chills, diaphoresis, appetite change and fatigue.  HEENT: Denies photophobia, eye pain, redness, hearing loss, ear pain, congestion, sore throat, rhinorrhea, sneezing, mouth sores, trouble swallowing, neck pain, neck stiffness and tinnitus.   Respiratory: Denies SOB, DOE, cough, chest tightness,  and wheezing.   Cardiovascular: Denies chest pain, palpitations and leg swelling.  Gastrointestinal: Denies nausea, vomiting, abdominal pain, diarrhea, constipation, blood in stool and abdominal distention.  Genitourinary: Denies dysuria, urgency, frequency, hematuria, flank pain and difficulty urinating.  Endocrine: Denies: hot or cold intolerance, sweats, changes in hair or nails, polyuria, polydipsia. Musculoskeletal: Denies myalgias, back pain, joint  swelling, arthralgias and gait problem.  Skin: Denies pallor, rash and wound.  Neurological: Denies dizziness, seizures, syncope, weakness, light-headedness, numbness and headaches.  Hematological: Denies adenopathy. Easy bruising, personal or family bleeding history  Psychiatric/Behavioral: Denies suicidal ideation, mood changes, confusion, nervousness, sleep disturbance and agitation   Past Medical History:  Diagnosis Date  . Age-related macular degeneration, wet, right eye (HCC)    per pt has had treatment in past  . Anxiety   . Chronic constipation   . Chronic low back pain   . Chronic neck pain   . 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 Depression   . DOE (dyspnea on exertion)   . GERD   . Headache   . Hemorrhoids   . History of basal cell carcinoma excision    left cheek  . History of colon polyps   . History of hyperthyroidism 2011   RAI treatement  . History of panic attacks   . Hyperlipidemia   . Hypertension   . Hypothyroidism, postradioiodine therapy    followed by pcp  . IDA (iron deficiency anemia)    intermittant  . Insomnia   . OA (osteoarthritis)   . OSA (obstructive sleep apnea)    no cpap, per pt tried unable to tolerate  . Peripheral neuropathy    legs and feet from hx back surgery's  . Rash    bilateral axilla area -- per pt due to some personal wipes used other the counter  . RLS (restless legs syndrome)   . Spondylolisthesis at L1-L2 level   . Tingling of both upper extremities    left greater than right due to cervical  pinched nerve  . Type 2 diabetes mellitus (HCC)    followed by pcp  . Umbilical hernia   . Urinary frequency   . Urinary urgency     Past Surgical History:  Procedure Laterality Date  . ANTERIOR CERVICAL DECOMP/DISCECTOMY FUSION N/A 09/02/2014   Procedure: Cervical five- six  Anterior cervical decompression fusion;  Surgeon: Barnett Abu, MD;  Location: [facility];   Service: Neurosurgery;  Laterality: N/A;  C5-6 Anterior cervical decompression/diskectomy/fusion  . BREAST BIOPSY Right 10/2018   x 2 benign  . CARPAL TUNNEL RELEASE Right 2016  . CATARACT EXTRACTION W/ INTRAOCULAR LENS  IMPLANT, BILATERAL  03/2015  . COLONOSCOPY  2007  . ESOPHAGOGASTRODUODENOSCOPY  2007  . HEMORRHOID SURGERY  03/23/2011   Procedure: HEMORRHOIDECTOMY;  Surgeon: Clovis Pu. Cornett, MD;  Location: [facility];  Service: General;  Laterality: N/A;  lateral internal sphincterotomy and hemorrhoidectomy  . LUMBAR FUSION  02/2015   L1 -- 2  . PARTIAL KNEE ARTHROPLASTY Left 12/27/2017   Procedure: UNICOMPARTMENTAL LEFT KNEE;  Surgeon: Teryl Lucy, MD;  Location: [facility];  Service: Orthopedics;  Laterality: Left;  . THORACIC DISCECTOMY N/A 12/15/2012   Procedure: Thoracic ten-eleven Thoracic laminectomy;  Surgeon: Barnett Abu, MD;  Location: [facility];  Service: Neurosurgery;  Laterality: N/A;  Thoracic ten-eleven Thoracic laminectomy  . TUBAL LIGATION Bilateral 1991    Family History  Problem Relation Age of Onset  . Diabetes Father   . COPD Father   . Cancer Maternal Grandfather        stomach    SOCIAL HX:   reports that she quit smoking about 16 years ago. Her smoking use included cigarettes. She has a 20.00 pack-year smoking history. She has never used smokeless tobacco. She reports current alcohol use. She reports that she does not use drugs.   Current Outpatient Medications:  .  ALPRAZolam (XANAX) 0.5 MG tablet, TAKE 1 TABLET (0.5 MG TOTAL) BY MOUTH 2 (TWO) TIMES DAILY AS NEEDED. FOR ANXIETY, Disp: 60 tablet, Rfl: 3 .  atenolol (TENORMIN) 25 MG tablet, TAKE 1 TABLET BY MOUTH  DAILY, Disp: 90 tablet, Rfl: 1 .  atorvastatin (LIPITOR) 40 MG tablet, TAKE 1 TABLET BY MOUTH  DAILY, Disp: 90 tablet, Rfl: 1 .  Calcium Carb-Cholecalciferol (CALCIUM 600/VITAMIN D3 PO), Take 1 tablet by mouth 2 (two) times daily., Disp: , Rfl:  .  Carboxymethylcellulose Sodium  (LUBRICANT EYE DROPS OP), Apply 2 drops to eye 4 (four) times daily as needed (dry eyes). , Disp: , Rfl:  .  citalopram (CELEXA) 40 MG tablet, TAKE 1 TABLET BY MOUTH  DAILY, Disp: 90 tablet, Rfl: 3 .  docusate sodium (COLACE) 100 MG capsule, Take 200 mg by mouth at bedtime. , Disp: , Rfl:  .  estradiol (ESTRACE) 0.1 MG/GM vaginal cream, PLACE 1 APPLICATORFUL  VAGINALLY 3 TIMES A WEEK., Disp: 170 g, Rfl: 3 .  gemfibrozil (LOPID) 600 MG tablet, TAKE 1 TABLET BY MOUTH  TWICE DAILY BEFORE MEALS, Disp: 180 tablet, Rfl: 3 .  hydrocortisone 2.5 % cream, APPLY TO AFFECTED AREA(S)  TOPICALLY TWICE DAILY, Disp: 85.05 g, Rfl: 0 .  Lancets (ONETOUCH ULTRASOFT) lancets, Use daily, Disp: 100 each, Rfl: 6 .  levothyroxine (SYNTHROID) 137 MCG tablet, Take 1 tablet (137 mcg total) by mouth daily., Disp: 90 tablet, Rfl: 1 .  loratadine (CLARITIN) 10 MG tablet, Take 10 mg by mouth daily as needed for allergies., Disp: , Rfl:  .  meloxicam (MOBIC) 15 MG tablet, TAKE  1 TABLET BY MOUTH  DAILY, Disp: 90 tablet, Rfl: 3 .  metFORMIN (GLUCOPHAGE) 1000 MG tablet, TAKE 1 TABLET BY MOUTH TWO  TIMES DAILY WITH MEALS, Disp: 180 tablet, Rfl: 3 .  Multiple Vitamin (MULTIVITAMIN) tablet, Take 1 tablet by mouth daily., Disp: , Rfl:  .  Omega-3 Fatty Acids (FISH OIL) 1200 MG CAPS, Take 1 capsule by mouth daily. , Disp: , Rfl:  .  ONE TOUCH ULTRA TEST test strip, USE 1 DAILY AS DIRECTED, Disp: 100 each, Rfl: 3 .  pantoprazole (PROTONIX) 40 MG tablet, TAKE 1 TABLET BY MOUTH  TWICE DAILY BEFORE MEALS, Disp: 180 tablet, Rfl: 1 .  pioglitazone (ACTOS) 15 MG tablet, TAKE 1 TABLET BY MOUTH  DAILY, Disp: 90 tablet, Rfl: 3 .  pregabalin (LYRICA) 75 MG capsule, Take 1 capsule (75 mg total) by mouth daily., Disp: 90 capsule, Rfl: 1 .  rOPINIRole (REQUIP) 1 MG tablet, TAKE 1 TABLET BY MOUTH AT  BEDTIME, Disp: 90 tablet, Rfl: 3 .  sennosides-docusate sodium (SENOKOT-S) 8.6-50 MG tablet, Take 2 tablets by mouth daily., Disp: 30 tablet, Rfl: 1 .   tiZANidine (ZANAFLEX) 4 MG capsule, Take 1 capsule (4 mg total) by mouth 3 (three) times daily., Disp: 180 capsule, Rfl: 1 .  tiZANidine (ZANAFLEX) 4 MG tablet, Take 1 tablet (4 mg total) by mouth 3 (three) times daily as needed., Disp: 180 tablet, Rfl: 1 .  tobramycin (TOBREX) 0.3 % ophthalmic solution, , Disp: , Rfl:  .  tolnaftate (TINACTIN) 1 % spray, Apply topically 2 (two) times daily as needed., Disp: 130 g, Rfl: 0 .  traZODone (DESYREL) 100 MG tablet, TAKE 1 TO 2 TABLETS BY  MOUTH AT BEDTIME, Disp: 180 tablet, Rfl: 3 .  HYDROcodone-acetaminophen (NORCO) 7.5-325 MG tablet, Take 1 tablet by mouth every 6 (six) hours as needed for moderate pain., Disp: 120 tablet, Rfl: 0 .  HYDROcodone-acetaminophen (NORCO) 7.5-325 MG tablet, Take 1 tablet by mouth every 6 (six) hours as needed for moderate pain., Disp: 120 tablet, Rfl: 0 .  HYDROcodone-acetaminophen (NORCO) 7.5-325 MG tablet, Take 1 tablet by mouth every 6 (six) hours as needed for moderate pain., Disp: 120 tablet, Rfl: 0 .  lisinopril (ZESTRIL) 10 MG tablet, TAKE 1 TABLET BY MOUTH  DAILY (Patient not taking: Reported on 03/26/2020), Disp: 90 tablet, Rfl: 3  EXAM:   VITALS per patient if applicable: None reported  GENERAL: alert, oriented, appears well and in no acute distress  HEENT: atraumatic, conjunttiva clear, no obvious abnormalities on inspection of external nose and ears  NECK: normal movements of the head and neck  LUNGS: on inspection no signs of respiratory distress, breathing rate appears normal, no obvious gross increased work of breathing, gasping or wheezing  CV: no obvious cyanosis  MS: moves all visible extremities without noticeable abnormality  PSYCH/NEURO: pleasant and cooperative, no obvious depression or anxiety, speech and thought processing grossly intact  ASSESSMENT AND PLAN:   Other chronic back pain Spinal stenosis, lumbar region, with neurogenic claudication  -PDMP reviewed, overdose risk score 350,  she has 1 red flags for a number of dispensing pharmacies, all recent prescriptions have been by me. -I will refill hydrocodone 7.5/325 mg to take 1 tablet every 6 hours as needed for pain, she will receive 120 tablets/month x 3 months.  Acquired hypothyroidism -She is due to have TSH rechecked after Synthroid dose change.  Essential hypertension -She was taken off lisinopril in October after labs showed an worsening creatinine and hyperkalemia, unfortunately she  has not returned for labs as ordered and has not been monitoring blood pressure at home. -She will schedule appointment ASAP.  Vitamin D deficiency -.Vitamin D was because of dizziness, I have advised her to resume and we will recheck vitamin D levels when she returns.     I discussed the assessment and treatment plan with the patient. The patient was provided an opportunity to ask questions and all were answered. The patient agreed with the plan and demonstrated an understanding of the instructions.   The patient was advised to call back or seek an in-person evaluation if the symptoms worsen or if the condition fails to improve as anticipated.    Chaya Jan, MD  [facility] at [facility]

## 2020-03-27 ENCOUNTER — Ambulatory Visit: Payer: Medicare Other | Admitting: Pulmonary Disease

## 2020-03-27 ENCOUNTER — Telehealth: Payer: Medicare Other | Admitting: Internal Medicine

## 2020-03-27 ENCOUNTER — Encounter: Payer: Self-pay | Admitting: Pulmonary Disease

## 2020-03-27 ENCOUNTER — Other Ambulatory Visit: Payer: Self-pay

## 2020-03-27 VITALS — BP 120/80 | HR 67 | Temp 97.3°F | Ht 61.0 in | Wt 196.2 lb

## 2020-03-27 DIAGNOSIS — G8929 Other chronic pain: Secondary | ICD-10-CM

## 2020-03-27 DIAGNOSIS — G4733 Obstructive sleep apnea (adult) (pediatric): Secondary | ICD-10-CM

## 2020-03-27 DIAGNOSIS — M545 Low back pain, unspecified: Secondary | ICD-10-CM | POA: Diagnosis not present

## 2020-03-27 NOTE — Progress Notes (Signed)
Subjective:    Patient ID: Katie Booker, female    DOB: 1945-05-06, 75 y.o.   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 OSA was diagnosed in 2009 of severe degree.  The initial titration she did not tolerate mask well due to claustrophobia but subsequently was able to be titrated to 10 cm she was maintained on CPAP for a while and then 2014 so my partner Dr. Marilu Favre and was transitioned to BiPAP machine.  She could never get comfortable on this and ultimately stopped using her machine. PMH -chronic back pain on narcotics, hypertension  She now reports poor sleep quality, nonrefreshing sleep.  Chronic pain is much worse she takes narcotics every 6 hours and takes a muscle relaxant at night. Epworth sleepiness score is 6. Bedtime is between 10:11 PM, sleep latency is variable from minutes to hours, reports 1-2 nocturnal awakenings and sometimes is unable to fall back asleep, she is finally out of bed between 8 and 10 AM feeling tired with dryness of mouth and occasional headaches. Her weight is mostly unchanged compared to her sleep studies in 2009  There is no history suggestive of cataplexy, sleep paralysis or parasomnias  She quit smoking in 2007, about 20 pack years   Significant tests/ events reviewed 03/2007 PSG -195 pounds-AHI 97/hour, lowest desaturation 77% 09/2007 CPAP titration >> 10 cm   Past Medical History:  Diagnosis Date  . Age-related macular degeneration, wet, right eye (HCC)    per pt has had treatment in past  . Anxiety   . Chronic constipation   . Chronic low back pain   . Chronic neck pain   . 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 Depression   . DOE (dyspnea on exertion)   . GERD   . Headache   . Hemorrhoids   . History of basal cell carcinoma excision    left cheek  . History of colon polyps   . History of hyperthyroidism 2011    RAI treatement  . History of panic attacks   . Hyperlipidemia   . Hypertension   . Hypothyroidism, postradioiodine therapy    followed by pcp  . IDA (iron deficiency anemia)    intermittant  . Insomnia   . OA (osteoarthritis)   . OSA (obstructive sleep apnea)    no cpap, per pt tried unable to tolerate  . Peripheral neuropathy    legs and feet from hx back surgery's  . Rash    bilateral axilla area -- per pt due to some personal wipes used other the counter  . RLS (restless legs syndrome)   . Spondylolisthesis at L1-L2 level   . Tingling of both upper extremities    left greater than right due to cervical pinched nerve  . Type 2 diabetes mellitus (HCC)    followed by pcp  . Umbilical hernia   . Urinary frequency   . Urinary urgency    Past Surgical History:  Procedure Laterality Date  . ANTERIOR CERVICAL DECOMP/DISCECTOMY FUSION N/A 09/02/2014   Procedure: Cervical five- six  Anterior cervical decompression fusion;  Surgeon: Barnett Abu, MD;  Location: [facility];  Service: Neurosurgery;  Laterality: N/A;  C5-6 Anterior cervical decompression/diskectomy/fusion  . BREAST BIOPSY Right 10/2018   x 2 benign  . CARPAL TUNNEL RELEASE Right 2016  . CATARACT EXTRACTION W/ INTRAOCULAR LENS  IMPLANT, BILATERAL  03/2015  .  COLONOSCOPY  2007  . ESOPHAGOGASTRODUODENOSCOPY  2007  . HEMORRHOID SURGERY  03/23/2011   Procedure: HEMORRHOIDECTOMY;  Surgeon: Clovis Pu. Cornett, MD;  Location: [facility];  Service: General;  Laterality: N/A;  lateral internal sphincterotomy and hemorrhoidectomy  . LUMBAR FUSION  02/2015   L1 -- 2  . PARTIAL KNEE ARTHROPLASTY Left 12/27/2017   Procedure: UNICOMPARTMENTAL LEFT KNEE;  Surgeon: Teryl Lucy, MD;  Location: [facility];  Service: Orthopedics;  Laterality: Left;  . THORACIC DISCECTOMY N/A 12/15/2012   Procedure: Thoracic ten-eleven Thoracic laminectomy;  Surgeon: Barnett Abu, MD;  Location: [facility];  Service: Neurosurgery;   Laterality: N/A;  Thoracic ten-eleven Thoracic laminectomy  . TUBAL LIGATION Bilateral 1991    Allergies  Allergen Reactions  . Duraprep [employer], Misc.] Itching and Rash    Irritation everywhere prep was used  . Advil Allergy Sinus [Chlorpheniramine-Pse-Ibuprofen]     Nervousness   . Betadine [Povidone Iodine] Other (See Comments)    Burning sensation.   . Hydromorphone Hcl Other (See Comments)    Makes crazy  . Morphine And Related Other (See Comments)    hallucinations    Social History   Socioeconomic History  . Marital status: Single    Spouse name: Not on file  . Number of children: Not on file  . Years of education: Not on file  . Highest education level: Not on file  Occupational History  . Occupation: retired  Tobacco Use  . Smoking status: Former Smoker    Packs/day: 1.00    Years: 20.00    Pack years: 20.00    Types: Cigarettes    Quit date: 02/23/2004    Years since quitting: 16.1  . Smokeless tobacco: Never Used  . Tobacco comment: quit 2007  Substance and Sexual Activity  . Alcohol use: Yes    Alcohol/week: 0.0 standard drinks    Comment: rare  . Drug use: No  . Sexual activity: Never    Birth control/protection: Post-menopausal  Other Topics Concern  . Not on file  Social History Narrative  . Not on file   Social Determinants of Health   Financial Resource Strain: Not on file  Food Insecurity: Not on file  Transportation Needs: Not on file  Physical Activity: Not on file  Stress: Not on file  Social Connections: Not on file  Intimate Partner Violence: Not on file    Family History  Problem Relation Age of Onset  . Diabetes Father   . COPD Father   . Cancer Maternal Grandfather        stomach      Review of Systems Shortness of breath with activity Ambulates with a walker Acid heartburn, indigestion Abdominal pain Headaches Nasal congestion Anxiety and depression Joint stiffness Chronic back pain    Objective:    Physical Exam  Gen. Pleasant, elderly, in no distress, normal affect ENT - no pallor,icterus, no post nasal drip Neck: No JVD, no thyromegaly, no carotid bruits Lungs: no use of accessory muscles, no dullness to percussion, clear without rales or rhonchi  Cardiovascular: Rhythm regular, heart sounds  normal, no murmurs or gallops, no peripheral edema Abdomen: Central adiposity soft and non-tender, no hepatosplenomegaly, BS normal. Musculoskeletal: No deformities, no cyanosis or clubbing, ambulates with a walker Neuro:  alert, non focal       Assessment & Plan:

## 2020-03-27 NOTE — Assessment & Plan Note (Signed)
She has severe OSA and unfortunately was unable to tolerate CPAP due to claustrophobia.  I think she will do better with a nasal mask. She still has her BiPAP machine and we will set her up with a mask desensitization session to see if we can find her an adequate fitting mask.  AirFit N30 may be a start. We will also set her up for home sleep test and see if she will qualify for a new machine I am hopeful that we can get her situated with PAP therapy.  On her prior titration she only required CPAP of 10 cm stopping that will be adequate with an EPR setting of 3.  She may not need a BiPAP after all.  We also discussed inspire device if she is unable to tolerate CPAP therapy, her BMI is 37 I have advised her 10 to 15 pound weight loss and if she does this then we can possibly pursue evaluation

## 2020-03-27 NOTE — Patient Instructions (Signed)
   Call [phone] to make appt for mask desensitization session Meanwhile schedule home sleep test and based on this we will try to get you a new CPAP machine

## 2020-03-27 NOTE — Assessment & Plan Note (Signed)
We discussed interplay of sleep problems with chronic pain

## 2020-04-02 ENCOUNTER — Telehealth: Payer: Self-pay | Admitting: Pharmacist

## 2020-04-02 NOTE — Chronic Care Management (AMB) (Signed)
I spoke with the patient about her appointment. Unfortunately, she did not feel well at all. Her appointment was rescheduled for March the 8th at 2:00 PM.   Silver Huguenin Barnetta Chapel) Bascom Levels, [facility] Clinical Pharmacy Assistant [phone]

## 2020-04-03 ENCOUNTER — Ambulatory Visit: Payer: Medicare Other

## 2020-04-08 ENCOUNTER — Other Ambulatory Visit: Payer: Self-pay

## 2020-04-08 ENCOUNTER — Ambulatory Visit (HOSPITAL_BASED_OUTPATIENT_CLINIC_OR_DEPARTMENT_OTHER): Payer: Medicare Other | Attending: Pulmonary Disease | Admitting: Pulmonary Disease

## 2020-04-08 DIAGNOSIS — G4733 Obstructive sleep apnea (adult) (pediatric): Secondary | ICD-10-CM

## 2020-04-10 ENCOUNTER — Other Ambulatory Visit: Payer: Self-pay

## 2020-04-10 ENCOUNTER — Ambulatory Visit: Payer: Medicare Other

## 2020-04-10 DIAGNOSIS — G4733 Obstructive sleep apnea (adult) (pediatric): Secondary | ICD-10-CM | POA: Diagnosis not present

## 2020-04-17 ENCOUNTER — Telehealth: Payer: Self-pay | Admitting: Pulmonary Disease

## 2020-04-17 DIAGNOSIS — G4733 Obstructive sleep apnea (adult) (pediatric): Secondary | ICD-10-CM

## 2020-04-17 NOTE — Telephone Encounter (Signed)
HST showed mod OSA with AHI 17/ hr Suggest CPAP 10 cm, EPR 3 mask of choice OV with me/APP in 6 -8 wks

## 2020-04-18 NOTE — Addendum Note (Signed)
Addended by: Melonie Florida on: 04/18/2020 01:54 PM   Modules accepted: Orders

## 2020-04-18 NOTE — Telephone Encounter (Signed)
Patient returned phone call and writer went over HST results per Dr Vassie Loll with patient. All questions answered and patient expressed understanding and agreeable to Dr Reginia Naas recommendation for CPAP. Order placed per Dr Vassie Loll. Patient expressed understanding to call and schedule office visit with Dr Vassie Loll or NP 6-8 weeks after CPAP is set up. Nothing further needed at this time.

## 2020-04-18 NOTE — Telephone Encounter (Signed)
Called and left message on voicemail to please return phone call to go over results. Contact number provided. 

## 2020-04-20 ENCOUNTER — Other Ambulatory Visit: Payer: Self-pay | Admitting: Internal Medicine

## 2020-04-28 ENCOUNTER — Telehealth: Payer: Self-pay | Admitting: Pharmacist

## 2020-04-28 NOTE — Chronic Care Management (AMB) (Signed)
I spoke with the patient about her upcoming appointment on 03/08/ 2022@ with the clinical pharmacist.  She was not able to make this appointment and a new one was made for 05/12/2020 @ 3:30 pm.   Berenice Bouton, [facility] Clinical Pharmacy Assistant [phone]

## 2020-04-29 ENCOUNTER — Telehealth: Payer: Self-pay | Admitting: *Deleted

## 2020-04-29 ENCOUNTER — Ambulatory Visit: Payer: Medicare Other

## 2020-04-29 DIAGNOSIS — K219 Gastro-esophageal reflux disease without esophagitis: Secondary | ICD-10-CM

## 2020-04-29 DIAGNOSIS — M4804 Spinal stenosis, thoracic region: Secondary | ICD-10-CM

## 2020-04-29 DIAGNOSIS — F331 Major depressive disorder, recurrent, moderate: Secondary | ICD-10-CM

## 2020-04-29 DIAGNOSIS — E119 Type 2 diabetes mellitus without complications: Secondary | ICD-10-CM

## 2020-04-29 DIAGNOSIS — E039 Hypothyroidism, unspecified: Secondary | ICD-10-CM

## 2020-04-29 DIAGNOSIS — E785 Hyperlipidemia, unspecified: Secondary | ICD-10-CM

## 2020-04-29 NOTE — Telephone Encounter (Signed)
-----   Message from Verner Chol, [facility] sent at 04/28/2020  2:31 PM EST ----- Regarding: CCM referral Hi,  Can you please put in a CCM referral for Ms. Runnion?  Thank you, Maddie

## 2020-04-30 ENCOUNTER — Telehealth: Payer: Self-pay | Admitting: Internal Medicine

## 2020-04-30 NOTE — Telephone Encounter (Signed)
The patient would like to have an order placed for a wheel chair. Also wants a Good rollator and a basket.  [facility]  [address] [phone]

## 2020-05-01 ENCOUNTER — Encounter: Payer: Self-pay | Admitting: Internal Medicine

## 2020-05-01 ENCOUNTER — Other Ambulatory Visit: Payer: Self-pay | Admitting: Internal Medicine

## 2020-05-01 DIAGNOSIS — F419 Anxiety disorder, unspecified: Secondary | ICD-10-CM

## 2020-05-01 NOTE — Telephone Encounter (Signed)
Ok to place orders for DME as requested.

## 2020-05-02 ENCOUNTER — Other Ambulatory Visit: Payer: Self-pay

## 2020-05-02 DIAGNOSIS — M4316 Spondylolisthesis, lumbar region: Secondary | ICD-10-CM

## 2020-05-02 DIAGNOSIS — M545 Low back pain, unspecified: Secondary | ICD-10-CM

## 2020-05-02 DIAGNOSIS — M1712 Unilateral primary osteoarthritis, left knee: Secondary | ICD-10-CM

## 2020-05-02 DIAGNOSIS — G8929 Other chronic pain: Secondary | ICD-10-CM

## 2020-05-02 DIAGNOSIS — E6609 Other obesity due to excess calories: Secondary | ICD-10-CM

## 2020-05-02 DIAGNOSIS — Z96659 Presence of unspecified artificial knee joint: Secondary | ICD-10-CM

## 2020-05-02 MED ORDER — BARIATRIC ROLLATOR MISC: 0 refills | Status: DC

## 2020-05-06 NOTE — Telephone Encounter (Signed)
Order faxed and confirmed.

## 2020-05-07 ENCOUNTER — Encounter: Payer: Self-pay | Admitting: Internal Medicine

## 2020-05-08 ENCOUNTER — Telehealth: Payer: Self-pay | Admitting: Pharmacist

## 2020-05-08 NOTE — Chronic Care Management (AMB) (Signed)
Chronic Care Management Pharmacy Assistant   Name: Katie Booker  MRN: [id number] DOB: 09/30/1945  Reason for Encounter: Initial Questions for Pharmacist visit on 05-12-2020 Patient Questions:   1. Have you seen any other providers since your last visit?  a. Pulmonary b. retinal specialist 2. Any changes in your medications or health?  a. She had stopped her vitamin D for over a month. she started to reuse it again about two weeks ago. 3. Any side effects from any medications?  4. Do you have an symptoms or problems not managed by your medications? No 5. Any concerns about your health right now?  a. She's having some gynecological issues 6. Has your provider asked that you check blood pressure, blood sugar, or follow special diet at home?  a. She is a diabetic. But she does not check her blood sugars. 7. Do you get any type of exercise on a regular basis?  a. She would like to work out but currently using a Environmental consultant. 8. Can you think of a goal you would like to reach for your health?  a. Lose weight b. be able to tolerate a c-pap machine c. get her diabetes under control 9. Do you have any problems getting your medications? No 10. Is there anything that you would like to discuss during the appointment? No  The patient was asked to please bring medications, blood pressure/ blood sugar log, and supplements to she appointment.       Medications: Outpatient Encounter Medications as of 05/08/2020  Medication Sig  . ALPRAZolam (XANAX) 0.5 MG tablet TAKE 1 TABLET (0.5 MG TOTAL) BY MOUTH 2 (TWO) TIMES DAILY AS NEEDED. FOR ANXIETY  . atenolol (TENORMIN) 25 MG tablet TAKE 1 TABLET BY MOUTH  DAILY  . atorvastatin (LIPITOR) 40 MG tablet TAKE 1 TABLET BY MOUTH  DAILY  . Calcium Carb-Cholecalciferol (CALCIUM 600/VITAMIN D3 PO) Take 1 tablet by mouth 2 (two) times daily.  . Carboxymethylcellulose Sodium (LUBRICANT EYE DROPS OP) Apply 2 drops to eye 4 (four) times daily as needed (dry eyes).    . citalopram (CELEXA) 40 MG tablet TAKE 1 TABLET BY MOUTH  DAILY  . docusate sodium (COLACE) 100 MG capsule Take 200 mg by mouth at bedtime.   Marland Kitchen estradiol (ESTRACE) 0.1 MG/GM vaginal cream PLACE 1 APPLICATORFUL  VAGINALLY 3 TIMES A WEEK.  Marland Kitchen gemfibrozil (LOPID) 600 MG tablet TAKE 1 TABLET BY MOUTH  TWICE DAILY BEFORE MEALS  . HYDROcodone-acetaminophen (NORCO) 7.5-325 MG tablet Take 1 tablet by mouth every 6 (six) hours as needed for moderate pain.  Marland Kitchen HYDROcodone-acetaminophen (NORCO) 7.5-325 MG tablet Take 1 tablet by mouth every 6 (six) hours as needed for moderate pain.  Marland Kitchen HYDROcodone-acetaminophen (NORCO) 7.5-325 MG tablet Take 1 tablet by mouth every 6 (six) hours as needed for moderate pain.  . hydrocortisone 2.5 % cream APPLY TO AFFECTED AREA(S)  TOPICALLY TWICE DAILY  . Lancets (ONETOUCH ULTRASOFT) lancets Use daily  . levothyroxine (SYNTHROID) 137 MCG tablet Take 1 tablet (137 mcg total) by mouth daily.  Marland Kitchen lisinopril (ZESTRIL) 10 MG tablet TAKE 1 TABLET BY MOUTH  DAILY  . loratadine (CLARITIN) 10 MG tablet Take 10 mg by mouth daily as needed for allergies.  . meloxicam (MOBIC) 15 MG tablet TAKE 1 TABLET BY MOUTH  DAILY  . metFORMIN (GLUCOPHAGE) 1000 MG tablet TAKE 1 TABLET BY MOUTH TWO  TIMES DAILY WITH MEALS  . Misc. Devices (BARIATRIC ROLLATOR) MISC 1 Rollator with basket.  . Multiple Vitamin (  MULTIVITAMIN) tablet Take 1 tablet by mouth daily.  . Omega-3 Fatty Acids (FISH OIL) 1200 MG CAPS Take 1 capsule by mouth daily.   . ONE TOUCH ULTRA TEST test strip USE 1 DAILY AS DIRECTED  . pantoprazole (PROTONIX) 40 MG tablet TAKE 1 TABLET BY MOUTH  TWICE DAILY BEFORE MEALS  . pioglitazone (ACTOS) 15 MG tablet TAKE 1 TABLET BY MOUTH  DAILY  . pregabalin (LYRICA) 75 MG capsule Take 1 capsule (75 mg total) by mouth daily.  Marland Kitchen rOPINIRole (REQUIP) 1 MG tablet TAKE 1 TABLET BY MOUTH AT  BEDTIME  . sennosides-docusate sodium (SENOKOT-S) 8.6-50 MG tablet Take 2 tablets by mouth daily.  Marland Kitchen  tiZANidine (ZANAFLEX) 4 MG capsule Take 1 capsule (4 mg total) by mouth 3 (three) times daily.  Marland Kitchen tiZANidine (ZANAFLEX) 4 MG tablet Take 1 tablet (4 mg total) by mouth 3 (three) times daily as needed.  . tobramycin (TOBREX) 0.3 % ophthalmic solution   . tolnaftate (TINACTIN) 1 % spray Apply topically 2 (two) times daily as needed.  . traZODone (DESYREL) 100 MG tablet TAKE 1 TO 2 TABLETS BY  MOUTH AT BEDTIME   No facility-administered encounter medications on file as of 05/08/2020.     Star Rating Drugs:  Dispensed Quantity Pharmacy  Atorvastatin 20 mg 01.19.2022 90 [facility]  Metformin 1000 mg 02.15.2022 [facility]  Lisinopril 10 mg 02.23.2022 [facility]    Katie Booker, [place] Clinical Pharmacy Assistant [phone]

## 2020-05-09 ENCOUNTER — Ambulatory Visit (INDEPENDENT_AMBULATORY_CARE_PROVIDER_SITE_OTHER)
Admission: RE | Admit: 2020-05-09 | Discharge: 2020-05-09 | Disposition: A | Discharge status: Home / Self Care | Payer: Medicare Other | Source: Ambulatory Visit | Attending: Internal Medicine | Admitting: Internal Medicine

## 2020-05-09 ENCOUNTER — Other Ambulatory Visit: Payer: Self-pay

## 2020-05-09 DIAGNOSIS — Z13820 Encounter for screening for osteoporosis: Secondary | ICD-10-CM | POA: Diagnosis not present

## 2020-05-09 DIAGNOSIS — M859 Disorder of bone density and structure, unspecified: Secondary | ICD-10-CM

## 2020-05-12 ENCOUNTER — Ambulatory Visit (INDEPENDENT_AMBULATORY_CARE_PROVIDER_SITE_OTHER): Payer: Medicare Other | Admitting: Pharmacist

## 2020-05-12 ENCOUNTER — Other Ambulatory Visit: Payer: Self-pay

## 2020-05-12 DIAGNOSIS — E119 Type 2 diabetes mellitus without complications: Secondary | ICD-10-CM

## 2020-05-12 DIAGNOSIS — I10 Essential (primary) hypertension: Secondary | ICD-10-CM | POA: Diagnosis not present

## 2020-05-12 DIAGNOSIS — E785 Hyperlipidemia, unspecified: Secondary | ICD-10-CM

## 2020-05-12 NOTE — Progress Notes (Signed)
Chronic Care Management Pharmacy Note  05/15/2020 Name:  Katie Booker MRN:  [id number] DOB:  07-10-1945  Subjective: Katie Booker is an 75 y.o. year old female who is a primary patient of Isaac Bliss, Rayford Halsted, MD.  The CCM team was consulted for assistance with disease management and care coordination needs.    Engaged with patient face to face for initial visit in response to provider referral for pharmacy case management and/or care coordination services.   Consent to Services:  The patient was given the following information about Chronic Care Management services today, agreed to services, and gave verbal consent: 1. CCM service includes personalized support from designated clinical staff supervised by the primary care provider, including individualized plan of care and coordination with other care providers 2. 24/7 contact phone numbers for assistance for urgent and routine care needs. 3. Service will only be billed when office clinical staff spend 20 minutes or more in a month to coordinate care. 4. Only one practitioner may furnish and bill the service in a calendar month. 5.The patient may stop CCM services at any time (effective at the end of the month) by phone call to the office staff. 6. The patient will be responsible for cost sharing (co-pay) of up to 20% of the service fee (after annual deductible is met). Patient agreed to services and consent obtained.  Patient Care Team: Isaac Bliss, Rayford Halsted, MD as PCP - General (Internal Medicine) Viona Gilmore, [facility] as Pharmacist (Pharmacist)  Recent office visits: 03/26/20 Domingo Mend, MD: Patient presented for chronic conditions follow up and pain management. Placed order for DEXA. Recommended for her to resume vitamin D tablets.  12/05/19 Domingo Mend, MD: Patient presented for annual exam. Prescribed vitamin D 50,000 units weekly. Stopped lisinopril and recommended repeat BMET. Decreased levothyroxine to  137 mcg daily. Influenza vaccine administered.  Recent consult visits: 03/27/20 Kara Mead, MD (pulmonary): Patient presented for OSA consult. Recommended nasal mask. Plan to set her up with desensitization session.  11/28/19 Sherlynn Stalls (ophthalmology): Patient presented for bevacizumab injection. Unable to access notes.   Hospital visits: None in previous 6 months  Objective:  Lab Results  Component Value Date   CREATININE 1.25 (H) 12/05/2019   BUN 31 (H) 12/05/2019   GFR 62.27 08/15/2018   GFRNONAA >60 12/28/2017   GFRAA >60 12/28/2017   NA 138 12/05/2019   K 5.6 (H) 12/05/2019   CALCIUM 9.4 12/05/2019   CO2 25 12/05/2019   GLUCOSE 83 12/05/2019    Lab Results  Component Value Date/Time   HGBA1C 6.0 (A) 12/05/2019 08:37 AM   HGBA1C 6.2 08/15/2018 09:25 AM   HGBA1C 6.2 01/13/2018 12:28 PM   GFR 62.27 08/15/2018 09:25 AM   GFR 74.27 04/19/2016 08:42 AM   MICROALBUR <0.7 04/19/2016 08:42 AM   MICROALBUR 1.3 01/19/2016 01:43 PM    Last diabetic Eye exam:  Lab Results  Component Value Date/Time   HMDIABEYEEXA No Retinopathy 12/14/2017 12:00 AM    Last diabetic Foot exam: No results found for: HMDIABFOOTEX   Lab Results  Component Value Date   CHOL 157 12/05/2019   HDL 62 12/05/2019   LDLCALC 68 12/05/2019   LDLDIRECT 70.0 08/15/2018   TRIG 203 (H) 12/05/2019   CHOLHDL 2.5 12/05/2019    Hepatic Function Latest Ref Rng & Units 12/05/2019 08/15/2018 04/19/2016  Total Protein 6.1 - 8.1 g/dL 6.3 6.3 6.6  Albumin 3.5 - 5.2 g/dL - 4.3 4.2  AST 10 - 35 U/L  _0 ALT 6 - 29 U/L _1 Alk Phosphatase 39 - 117 U/L - 69 66  Total Bilirubin 0.2 - 1.2 mg/dL 0.3 0.3 0.3  Bilirubin, Direct 0.0 - 0.3 mg/dL - - 0.1    Lab Results  Component Value Date/Time   TSH 0.23 (L) 12/05/2019 09:41 AM   TSH 0.36 09/25/2018 02:38 PM    CBC Latest Ref Rng & Units 12/05/2019 08/15/2018 12/28/2017  WBC 3.8 - 10.8 Thousand/uL 7.4 10.5 7.3  Hemoglobin 11.7 - 15.5 g/dL 11.8 12.4  10.6(L)  Hematocrit 35.0 - 45.0 % 35.8 36.8 33.7(L)  Platelets 140 - 400 Thousand/uL 189 202.0 151    Lab Results  Component Value Date/Time   VD25OH 39 12/05/2019 09:41 AM   VD25OH 63.02 08/15/2018 09:25 AM    Clinical ASCVD: No  The 10-year ASCVD risk score Mikey Bussing DC Jr., et al., 2013) is: 29.3%   Values used to calculate the score:     Age: 62 years     Sex: Female     Is Non-Hispanic African American: No     Diabetic: Yes     Tobacco smoker: No     Systolic Blood Pressure: 295 mmHg     Is BP treated: Yes     HDL Cholesterol: 62 mg/dL     Total Cholesterol: 157 mg/dL    Depression screen [facility] 2/9 03/26/2020 12/05/2019 08/15/2018  Decreased Interest 1 1 0  Down, Depressed, Hopeless _2 PHQ - 2 Score _3 Altered sleeping _4 Tired, decreased energy _5 Change in appetite 0 3 0  Feeling bad or failure about yourself  0 1 0  Trouble concentrating 2 3 0  Moving slowly or fidgety/restless 0 0 0  Suicidal thoughts - 0 0  PHQ-9 Score _6 Difficult doing work/chores Not difficult at all Very difficult -  Some recent data might be hidden      Social History   Tobacco Use  Smoking Status Former Smoker  . Packs/day: 1.00  . Years: 20.00  . Pack years: 20.00  . Types: Cigarettes  . Quit date: 02/23/2004  . Years since quitting: 16.2  Smokeless Tobacco Never Used  Tobacco Comment   quit 2007   BP Readings from Last 3 Encounters:  03/27/20 120/80  12/05/19 118/80  08/15/18 110/68   Pulse Readings from Last 3 Encounters:  03/27/20 67  12/05/19 78  08/15/18 83   Wt Readings from Last 3 Encounters:  03/27/20 196 lb 3.2 oz (89 kg)  03/26/20 191 lb (86.6 kg)  12/05/19 202 lb (91.6 kg)   BMI Readings from Last 3 Encounters:  03/27/20 37.07 kg/m  03/26/20 35.80 kg/m  12/05/19 37.86 kg/m    Assessment/Interventions: Review of patient past medical history, allergies, medications, health status, including review of consultants reports, laboratory and  other test data, was performed as part of comprehensive evaluation and provision of chronic care management services.   SDOH:  (Social Determinants of Health) assessments and interventions performed: Yes SDOH Interventions   Flowsheet Row Most Recent Value  SDOH Interventions   Financial Strain Interventions Intervention Not Indicated  Transportation Interventions Intervention Not Indicated     Patient lives alone in a small condo with a cat, who is 20 years old. She has arthritis and has lots of back pain as well as she has had two fusions in her lumbar area and has metal in  neck. She last saw her neurosurgeon a year and a half ago and realizes she needs to make a follow up appointment.   She is still driving without any issues. She currently has one daughter Elmyra Ricks) who is an Therapist, sports in labor and delivery at [facility] and she has 2 adults sons of her own Merrilee Seashore in [place] and Ortley in [place]). She used to live in [place] and moved down to live closer to family. Her son lives in [place] and she hasn't been down in about 8 years to see him. She has lost contact with her brother due to a difference in opinion with politics and is upset by that.  She doesn't have much of a social life and reports it has been hard to get back in things with pandemic. She has a best friend in [place], whom she reconnected with from high school in the last 3 years, and a friend across the parking lot, whom she goes out to eat with every week. She is also pretty friendly with her neighbors.  She does have trouble with cooking and cleaning because she can't stand for very long. She doesn't eat out often and doesn't eat fast food. She tries to stay away from frozen meals but is not always successful and is not following a diabetic diet.  Patient is not walking as much due to her legs hurting more. She also has muscle pain in her arms and has a neighbor's friend offered to clean her house for free but she feels she will have to  clean prior to her coming over.  One of her biggest issues right now is with trouble falling asleep. She takes a pain pill and muscle relaxer before bed and sometimes takes the Xanax when she cannot go to sleep, even though she is aware not to. She sleeps maybe 4 hours a night and has a noisy neighbor who goes to bed at 1-2am. When she wakes up, she is awake for hours.  Patient denies any problems with her medications that she is aware of but does have concerns about going without pain medicine as she has had to wait for refills from the office and has been without pain medicine for 2.5 days.  CCM Care Plan  Allergies  Allergen Reactions  . Duraprep [employer], Misc.] Itching and Rash    Irritation everywhere prep was used  . Advil Allergy Sinus [Chlorpheniramine-Pse-Ibuprofen]     Nervousness   . Betadine [Povidone Iodine] Other (See Comments)    Burning sensation.   . Hydromorphone Hcl Other (See Comments)    Makes crazy  . Morphine And Related Other (See Comments)    hallucinations    Medications Reviewed Today    Reviewed by Viona Gilmore, [facility] (Pharmacist) on 05/12/20 at 1730  Med List Status: <None>  Medication Order Taking? Sig Documenting Provider Last Dose Status Informant  ALPRAZolam (XANAX) 0.5 MG tablet [id number] Yes TAKE 1 TABLET (0.5 MG TOTAL) BY MOUTH 2 (TWO) TIMES DAILY AS NEEDED. FOR ANXIETY Isaac Bliss, Rayford Halsted, MD Taking Active   atenolol (TENORMIN) 25 MG tablet [id number] Yes TAKE 1 TABLET BY MOUTH  DAILY Isaac Bliss, Rayford Halsted, MD Taking Active   atorvastatin (LIPITOR) 40 MG tablet [id number] Yes TAKE 1 TABLET BY MOUTH  DAILY Isaac Bliss, Rayford Halsted, MD Taking Active   Calcium Carb-Cholecalciferol (CALCIUM 600/VITAMIN D3 PO) [id number] Yes Take 1 tablet by mouth 2 (two) times daily. [provider] Taking Active Self  Carboxymethylcellulose Sodium (LUBRICANT  EYE DROPS OP) [id number] Yes Apply 2 drops to eye 4 (four) times daily as needed  (dry eyes).  [provider] Taking Active Self  citalopram (CELEXA) 40 MG tablet [id number] Yes TAKE 1 TABLET BY MOUTH  DAILY Isaac Bliss, Rayford Halsted, MD Taking Active   docusate sodium (COLACE) 100 MG capsule [id number] Yes Take 200 mg by mouth at bedtime.  [provider] Taking Active Self  ergocalciferol (VITAMIN D2) 1.25 MG (50000 UT) capsule [id number] Yes Take 50,000 Units by mouth once a week. [provider] Taking Active   estradiol (ESTRACE) 0.1 MG/GM vaginal cream [id number] No PLACE 1 APPLICATORFUL  VAGINALLY 3 TIMES A WEEK.  Patient not taking: Reported on 05/12/2020   Isaac Bliss, Rayford Halsted, MD Not Taking Active   gemfibrozil (LOPID) 600 MG tablet [id number] Yes TAKE 1 TABLET BY MOUTH  TWICE DAILY BEFORE MEALS Isaac Bliss, Rayford Halsted, MD Taking Active   HYDROcodone-acetaminophen [facility]) 7.5-325 MG tablet [id number] Yes Take 1 tablet by mouth every 6 (six) hours as needed for moderate pain. Isaac Bliss, Rayford Halsted, MD Taking Active   hydrocortisone 2.5 % cream [id number] Yes APPLY TO AFFECTED AREA(S)  TOPICALLY TWICE DAILY Isaac Bliss, Rayford Halsted, MD Taking Active   Lancets [facility] ULTRASOFT) lancets [id number]  Use daily Marletta Lor, MD  Active Self  levothyroxine (SYNTHROID) 137 MCG tablet [id number] Yes Take 1 tablet (137 mcg total) by mouth daily. Isaac Bliss, Rayford Halsted, MD Taking Active   lisinopril (ZESTRIL) 10 MG tablet [id number] No TAKE 1 TABLET BY MOUTH  DAILY  Patient not taking: Reported on 05/12/2020   Isaac Bliss, Rayford Halsted, MD Not Taking Active   loratadine (CLARITIN) 10 MG tablet [id number] No Take 10 mg by mouth daily as needed for allergies.  Patient not taking: Reported on 05/12/2020   [provider] Not Taking Active Self  meloxicam (MOBIC) 15 MG tablet [id number] Yes TAKE 1 TABLET BY MOUTH  DAILY Isaac Bliss, Rayford Halsted, MD Taking Active   metFORMIN (GLUCOPHAGE) 1000 MG tablet [id number] Yes TAKE 1 TABLET  BY MOUTH TWO  TIMES DAILY WITH MEALS Isaac Bliss, Rayford Halsted, MD Taking Active   Misc. Devices (BARIATRIC ROLLATOR) MISC [id number]  1 Rollator with basket. Isaac Bliss, Rayford Halsted, MD  Active   Multiple Vitamin (MULTIVITAMIN) tablet [id number] Yes Take 1 tablet by mouth daily. [provider] Taking Active Self  Omega-3 Fatty Acids (FISH OIL) 1200 MG CAPS [id number] Yes Take 1 capsule by mouth daily.  [provider] Taking Active Self  ONE TOUCH ULTRA TEST test strip [id number]  USE 1 DAILY AS DIRECTED Marletta Lor, MD  Active Self  pantoprazole (PROTONIX) 40 MG tablet [id number] Yes TAKE 1 TABLET BY MOUTH  TWICE DAILY BEFORE MEALS Isaac Bliss, Rayford Halsted, MD Taking Active   pioglitazone (ACTOS) 15 MG tablet [id number] Yes TAKE 1 TABLET BY MOUTH  DAILY Isaac Bliss, Rayford Halsted, MD Taking Active   pregabalin (LYRICA) 75 MG capsule [id number] Yes Take 1 capsule (75 mg total) by mouth daily. Isaac Bliss, Rayford Halsted, MD Taking Active   rOPINIRole (REQUIP) 1 MG tablet [id number] Yes TAKE 1 TABLET BY MOUTH AT  BEDTIME Isaac Bliss, Rayford Halsted, MD Taking Active   tiZANidine (ZANAFLEX) 4 MG tablet [id number] Yes Take 1 tablet (4 mg total) by mouth 3 (three) times daily as needed. Isaac Bliss, Rayford Halsted, MD Taking Active   tobramycin (TOBREX) 0.3 % ophthalmic solution [id number] Yes Place 1 drop into both eyes every 6 (  six) hours. Day before injection, day of injection and day after injection [provider] Taking Active   traZODone (DESYREL) 100 MG tablet [id number] Yes TAKE 1 TO 2 TABLETS BY  MOUTH AT BEDTIME Isaac Bliss, Rayford Halsted, MD Taking Active           Patient Active Problem List   Diagnosis Date Noted  . Morbid obesity ([place]) 01/13/2018  . S/P knee replacement 12/27/2017  . Spondylolisthesis at L1-L2 level 03/18/2015  . Spondylosis with myelopathy 09/02/2014  . Cervical spondylosis with myelopathy 09/02/2014  . Anxiety 12/21/2012  . Chronic  back pain 12/21/2012  . Insomnia 12/21/2012  . 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 Spinal stenosis, lumbar region, with neurogenic claudication 10/26/2012  . Spinal stenosis, thoracic 10/26/2012  . Umbilical hernia 47/82/9562  . OSA (obstructive sleep apnea) 11/12/2011  . Exogenous obesity 11/12/2011  . 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 Hypothyroidism 09/18/2009  . Diabetes mellitus without complication ([place]) 13/09/6576  . Dyslipidemia 09/18/2009  . Anemia 09/18/2009  . Depression 09/18/2009  . Essential hypertension 09/18/2009  . GERD 09/18/2009  . Primary localized osteoarthritis of left knee 09/18/2009    Immunization History  Administered Date(s) Administered  . Fluad Quad(high Dose 65+) 12/05/2019  . Influenza Split 01/28/2011, 11/12/2011  . Influenza Whole 11/20/2009  . Influenza, High Dose Seasonal PF 12/27/2013, 12/25/2014, 01/19/2016, 12/01/2016, 01/13/2018, 10/26/2018  . Influenza,inj,Quad PF,6+ Mos 12/18/2012  . PFIZER(Purple Top)SARS-COV-2 Vaccination 03/16/2019, 04/03/2019, 11/21/2019  . Pneumococcal Conjugate-13 12/27/2013  . Pneumococcal Polysaccharide-23 11/12/2011  . Tdap 07/17/2013  . Zoster Recombinat (Shingrix) 11/08/2017, 03/15/2018    Conditions to be addressed/monitored:  Hypertension, Hyperlipidemia, Diabetes, GERD, Hypothyroidism, Depression, Anxiety, Allergic Rhinitis and OSA, pain  Care Plan : CCM Pharmacy Care Plan  Updates made by Viona Gilmore, [place] since 05/15/2020 12:00 AM    Problem: Problem: Hypertension, Hyperlipidemia, Diabetes, GERD, Hypothyroidism, Depression, Anxiety, Allergic Rhinitis and OSA, pain     Long-Range Goal: Patient-Specific Goal   Start Date: 05/12/2020  Expected End Date: 05/12/2021  This Visit's Progress: On track  Priority: High  Note:   Current Barriers:  . Unable to independently monitor therapeutic efficacy . Unable to achieve control of cholesterol  . Suboptimal therapeutic  regimen for cholesterol  Pharmacist Clinical Goal(s):  Marland Kitchen Patient will achieve adherence to monitoring guidelines and medication adherence to achieve therapeutic efficacy . achieve control of cholesterol as evidenced by next cholesterol check  through collaboration with PharmD and provider.   Interventions: . 1:1 collaboration with Isaac Bliss, Rayford Halsted, MD regarding development and update of comprehensive plan of care as evidenced by provider attestation and co-signature . Inter-disciplinary care team collaboration (see longitudinal plan of care) . Comprehensive medication review performed; medication list updated in electronic medical record  Hypertension (BP goal <140/90) -Controlled -Current treatment: . Atenolol 25 mg 1 tablet daily - in AM . Lisinopril 10 mg 1 tablet daily - in AM (has not restarted) -Medications previously tried: none -Current home readings: does not regularly check at home -Current dietary habits: does not eat as well as she should -Current exercise habits: no regular exercise -Denies hypotensive/hypertensive symptoms -Educated on Exercise goal of 150 minutes per week; Importance of home blood pressure monitoring; Proper BP monitoring technique; -Counseled to monitor BP at home weekly, document, and provide log at future appointments -Recommended to continue current medication Scheduled appointment with lab to recheck kidney function to determine if she can restart lisinopril  Hyperlipidemia: (LDL goal < 100) -Controlled -Current treatment: .  Atorvastatin 40 mg 1 tablet daily . Gemfibrozil 600 mg 1 tablet twice daily before meals . Fish oil 1200 mg 1 capsule daily  -Medications previously tried: none  -Current dietary patterns: does not eat many vegetables -Current exercise habits: no structured exercise -Educated on Cholesterol goals;  Importance of limiting foods high in cholesterol; Exercise goal of 150 minutes per week; -Recommended switching  gemfibrozil and fish oil to Vascepa  Diabetes (A1c goal <7%) -Controlled -Current medications: Marland Kitchen Metformin 1000 mg 1 tablet twice daily with meals . Pioglitazone 15 mg 1 tablet daily -Medications previously tried: glipizide (lows)  -Current home glucose readings: doesn't check blood sugar regularly . fasting glucose: n/a . post prandial glucose: n/a -Denies hypoglycemic/hyperglycemic symptoms -Current meal patterns:  . breakfast: apple slices and peanut butter, 2 cup of coffee with half an half . lunch: n/a . dinner: chicken salad with bread or butter lettuce . snacks: candy, potato chips, caramel corn . drinks: caffeine free diet pepsi, no sugary drinks -Current exercise: no structured exercise -Educated on A1c and blood sugar goals; Exercise goal of 150 minutes per week; Benefits of routine self-monitoring of blood sugar; Carbohydrate counting and/or plate method -Counseled to check feet daily and get yearly eye exams -Recommended to continue current medication Provided healthy plate handout  Depression/Anxiety (Goal: minimize symptoms) -Uncontrolled -Current treatment: . Citalopram 40 mg 1 tablet daily . Alprazolam 0.5 mg 1 tablet twice daily as needed -Medications previously tried/failed: Wellbutrin & Effexor (split personalities), Prozac, Lexapro, Zoloft, amitriptyline -PHQ9: 10 -GAD7: n/a -Educated on Benefits of medication for symptom control Benefits of cognitive-behavioral therapy with or without medication -Recommended to continue current medication Reminded patient to call behavioral health to set up appointment  Insomnia (Goal: improve quality and quantity of sleep) -Uncontrolled -Current treatment  . Trazodone 100 mg 1-2 tablets at bedtime as needed -Medications previously tried: Ambien  -Counseled on practicing good sleep hygiene by setting a sleep schedule and maintaining it, avoid excessive napping, following a nightly routine, avoiding screen time for  30-60 minutes before going to bed, and making the bedroom a cool, quiet and dark space  Pain (Goal: minimize symptoms) -Uncontrolled -Current treatment  . Pregabalin 75 mg 1 capsule daily (more puts her to sleep) . Hydrocodone-APAP 7.5-325 1 tablet every 6 hours as needed . Tizanidine 4 mg tablet 3 times a day (mostly taking 2 as needed) . Meloxicam 15 mg 1 tablet daily -Medications previously tried: n/a -Recommended to continue current medication Counseled on avoidance of taking Hydrocodone, Tizanidine and Alprazolam at the same time  GERD (Goal: minimize symptoms) -Controlled -Current treatment  . Pantoprazole 40 mg 1 tablet twice daily before meals as needed -Medications previously tried: n/a -Counseled on non-pharmacologic management of symptoms such as elevating the head of your bed, avoiding eating 2-3 hours before bed, avoiding triggering foods such as acidic, spicy, or fatty foods, eating smaller meals, and wearing clothes that are loose around the waist  Vitamin D deficiency (Goal: vitamin D 30-100) -Uncontrolled -Current treatment  . Ergocalciferol 50,000 units 1 tablet at bedtime -Medications previously tried: n/a  -Recommended to continue current medication Counseled on taking with food to help absorption Educated on waiting for 12 weeks to recheck lab work  Allergic rhinitis (Goal: minimize symptoms) -Controlled -Current treatment  . Loratadine 10 mg 1 tablet daily as needed -Medications previously tried: Advil (anxious)  -Recommended to continue current medication  Hypothyroidism (Goal: TSH 0.35-4.5) -Uncontrolled -Current treatment  . Levothyroxine 137 mcg 1 tablet daily -Medications previously  tried: none  -Counseled on taking on an empty stomach prior to all other food and medications  Restless legs syndrome (Goal: minimize symptoms) -Controlled -Current treatment  . Ropinirole 1 mg 1 tablet at bedtime -Medications previously tried: n/a  -Recommended  to continue current medication Counseled on avoidance of caffeine to minimize symptoms  Osteopenia (Goal improve bone density and prevent fractures) -Uncontrolled -Last DEXA Scan: 05/09/20   T-Score femoral neck: -2.0, -2.4  T-Score total hip: n/a  T-Score lumbar spine: n/a  T-Score forearm radius: -1.5  10-year probability of major osteoporotic fracture: 14.3%  10-year probability of hip fracture: 4.0% -Patient is a candidate for pharmacologic treatment due to T-Score -1.0 to -2.5 and 10-year risk of hip fracture > 3% -Current treatment  . Calcium-vitamin D daily -Medications previously tried: none -Recommend [phone] units of vitamin D daily. Recommend 1200 mg of calcium daily from dietary and supplemental sources. Recommend weight-bearing and muscle strengthening exercises for building and maintaining bone density. -Counseled on switching calcium carbonate to calcium citrate to improve absorption   Health Maintenance -Vaccine gaps: none -Current therapy:  . Hydrocortisone 2.5% cream . Multivitamin 1 tablet daily . Docusate 100 mg 2 capsules at bedtime . Preservision Areds 2 1 capsule twice daily . Lubricant ([place]) . Estradiol cream (prolapse) -Educated on Cost vs benefit of each product must be carefully weighed by individual consumer -Patient is satisfied with current therapy and denies issues -Recommended to continue current medication  Patient Goals/Self-Care Activities . Patient will:  - take medications as prescribed check glucose a few times a week, document, and provide at future appointments check blood pressure weekly, document, and provide at future appointments target a minimum of 150 minutes of moderate intensity exercise weekly  Follow Up Plan: Telephone follow up appointment with care management team member scheduled for:  3 months      Medication Assistance: None required.  Patient affirms current coverage meets needs.  Patient's preferred pharmacy  is:  [facility], [address], [address] [place], [place] [address] Phone: [phone] Fax: [phone]  [id number]: [phone]: [phone] Uses pill box? No - n/a Pt endorses % compliance - need to readdress   We discussed: Current pharmacy is preferred with insurance plan and patient is satisfied with pharmacy services Patient decided to: Continue current medication management strategy  Care Plan and Follow Up Patient Decision:  Patient agrees to Care Plan and Follow-up.  Plan: Telephone follow up appointment with care management team member scheduled for:  3 months  MJeni Salles PharmD BFairviewPharmacist LOronoat BVilla [phone]

## 2020-05-14 NOTE — Patient Instructions (Addendum)
Hi Anahis,   It was great to get to meet you in person! Below is a summary of some of the topics we discussed. I will reach out about any changes once I hear back from Dr. Ardyth Harps.  Please reach out to me if you have any questions or need anything before our follow up!  Best, Maddie  Gaylord Shih, PharmD, [facility] Clinical Pharmacist [facility] [place] [phone]  Visit Information  Goals Addressed   None    Patient Care Plan: CCM Pharmacy Care Plan    Problem Identified: Problem: Hypertension, Hyperlipidemia, Diabetes, GERD, Hypothyroidism, Depression, Anxiety, Allergic Rhinitis and OSA, pain     Long-Range Goal: Patient-Specific Goal   Start Date: 05/12/2020  Expected End Date: 05/12/2021  This Visit's Progress: On track  Priority: High  Note:   Current Barriers:  . Unable to independently monitor therapeutic efficacy . Unable to achieve control of cholesterol  . Suboptimal therapeutic regimen for cholesterol  Pharmacist Clinical Goal(s):  Marland Kitchen Patient will achieve adherence to monitoring guidelines and medication adherence to achieve therapeutic efficacy . achieve control of cholesterol as evidenced by next cholesterol check  through collaboration with PharmD and provider.   Interventions: . 1:1 collaboration with Philip Aspen, Limmie Patricia, MD regarding development and update of comprehensive plan of care as evidenced by provider attestation and co-signature . Inter-disciplinary care team collaboration (see longitudinal plan of care) . Comprehensive medication review performed; medication list updated in electronic medical record  Hypertension (BP goal <140/90) -Controlled -Current treatment: . Atenolol 25 mg 1 tablet daily - in AM . Lisinopril 10 mg 1 tablet daily - in AM (has not restarted) -Medications previously tried: none -Current home readings: does not regularly check at home -Current dietary habits: does not eat as well as she  should -Current exercise habits: no regular exercise -Denies hypotensive/hypertensive symptoms -Educated on Exercise goal of 150 minutes per week; Importance of home blood pressure monitoring; Proper BP monitoring technique; -Counseled to monitor BP at home weekly, document, and provide log at future appointments -Recommended to continue current medication Scheduled appointment with lab to recheck kidney function to determine if she can restart lisinopril  Hyperlipidemia: (LDL goal < 100) -Controlled -Current treatment: . Atorvastatin 40 mg 1 tablet daily . Gemfibrozil 600 mg 1 tablet twice daily before meals . Fish oil 1200 mg 1 capsule daily  -Medications previously tried: none  -Current dietary patterns: does not eat many vegetables -Current exercise habits: no structured exercise -Educated on Cholesterol goals;  Importance of limiting foods high in cholesterol; Exercise goal of 150 minutes per week; -Recommended switching gemfibrozil and fish oil to Vascepa  Diabetes (A1c goal <7%) -Controlled -Current medications: Marland Kitchen Metformin 1000 mg 1 tablet twice daily with meals . Pioglitazone 15 mg 1 tablet daily -Medications previously tried: glipizide (lows)  -Current home glucose readings: doesn't check blood sugar regularly . fasting glucose: n/a . post prandial glucose: n/a -Denies hypoglycemic/hyperglycemic symptoms -Current meal patterns:  . breakfast: apple slices and peanut butter, 2 cup of coffee with half an half . lunch: n/a . dinner: chicken salad with bread or butter lettuce . snacks: candy, potato chips, caramel corn . drinks: caffeine free diet pepsi, no sugary drinks -Current exercise: no structured exercise -Educated on A1c and blood sugar goals; Exercise goal of 150 minutes per week; Benefits of routine self-monitoring of blood sugar; Carbohydrate counting and/or plate method -Counseled to check feet daily and get yearly eye exams -Recommended to continue  current medication Provided healthy plate  handout  Depression/Anxiety (Goal: minimize symptoms) -Uncontrolled -Current treatment: . Citalopram 40 mg 1 tablet daily . Alprazolam 0.5 mg 1 tablet twice daily as needed -Medications previously tried/failed: Wellbutrin & Effexor (split personalities), Prozac, Lexapro, Zoloft, amitriptyline -PHQ9: 10 -GAD7: n/a -Educated on Benefits of medication for symptom control Benefits of cognitive-behavioral therapy with or without medication -Recommended to continue current medication Reminded patient to call behavioral health to set up appointment  Insomnia (Goal: improve quality and quantity of sleep) -Uncontrolled -Current treatment  . Trazodone 100 mg 1-2 tablets at bedtime as needed -Medications previously tried: Ambien  -Counseled on practicing good sleep hygiene by setting a sleep schedule and maintaining it, avoid excessive napping, following a nightly routine, avoiding screen time for 30-60 minutes before going to bed, and making the bedroom a cool, quiet and dark space  Pain (Goal: minimize symptoms) -Uncontrolled -Current treatment  . Pregabalin 75 mg 1 capsule daily (more puts her to sleep) . Hydrocodone-APAP 7.5-325 1 tablet every 6 hours as needed . Tizanidine 4 mg tablet 3 times a day (mostly taking 2 as needed) . Meloxicam 15 mg 1 tablet daily -Medications previously tried: n/a -Recommended to continue current medication Counseled on avoidance of taking Hydrocodone, Tizanidine and Alprazolam at the same time  GERD (Goal: minimize symptoms) -Controlled -Current treatment  . Pantoprazole 40 mg 1 tablet twice daily before meals as needed -Medications previously tried: n/a -Counseled on non-pharmacologic management of symptoms such as elevating the head of your bed, avoiding eating 2-3 hours before bed, avoiding triggering foods such as acidic, spicy, or fatty foods, eating smaller meals, and wearing clothes that are loose around  the waist  Vitamin D deficiency (Goal: vitamin D 30-100) -Uncontrolled -Current treatment  . Ergocalciferol 50,000 units 1 tablet at bedtime -Medications previously tried: n/a  -Recommended to continue current medication Counseled on taking with food to help absorption Educated on waiting for 12 weeks to recheck lab work  Allergic rhinitis (Goal: minimize symptoms) -Controlled -Current treatment  . Loratadine 10 mg 1 tablet daily as needed -Medications previously tried: Advil (anxious)  -Recommended to continue current medication  Hypothyroidism (Goal: TSH 0.35-4.5) -Uncontrolled -Current treatment  . Levothyroxine 137 mcg 1 tablet daily -Medications previously tried: none  -Counseled on taking on an empty stomach prior to all other food and medications  Restless legs syndrome (Goal: minimize symptoms) -Controlled -Current treatment  . Ropinirole 1 mg 1 tablet at bedtime -Medications previously tried: n/a  -Recommended to continue current medication Counseled on avoidance of caffeine to minimize symptoms  Osteopenia (Goal improve bone density and prevent fractures) -Uncontrolled -Last DEXA Scan: 05/09/20   T-Score femoral neck: -2.0, -2.4  T-Score total hip: n/a  T-Score lumbar spine: n/a  T-Score forearm radius: -1.5  10-year probability of major osteoporotic fracture: 14.3%  10-year probability of hip fracture: 4.0% -Patient is a candidate for pharmacologic treatment due to T-Score -1.0 to -2.5 and 10-year risk of hip fracture > 3% -Current treatment  . Calcium-vitamin D daily -Medications previously tried: none -Recommend [phone] units of vitamin D daily. Recommend 1200 mg of calcium daily from dietary and supplemental sources. Recommend weight-bearing and muscle strengthening exercises for building and maintaining bone density. -Counseled on switching calcium carbonate to calcium citrate to improve absorption   Health Maintenance -Vaccine gaps: none -Current  therapy:  . Hydrocortisone 2.5% cream . Multivitamin 1 tablet daily . Docusate 100 mg 2 capsules at bedtime . Preservision Areds 2 1 capsule twice daily . Lubricant (Jenteal) . Estradiol cream (  prolapse) -Educated on Cost vs benefit of each product must be carefully weighed by individual consumer -Patient is satisfied with current therapy and denies issues -Recommended to continue current medication  Patient Goals/Self-Care Activities . Patient will:  - take medications as prescribed check glucose a few times a week, document, and provide at future appointments check blood pressure weekly, document, and provide at future appointments target a minimum of 150 minutes of moderate intensity exercise weekly  Follow Up Plan: Telephone follow up appointment with care management team member scheduled for:  3 months     Ms. Mian was given information about Chronic Care Management services today including:  1. CCM service includes personalized support from designated clinical staff supervised by her physician, including individualized plan of care and coordination with other care providers 2. 24/7 contact phone numbers for assistance for urgent and routine care needs. 3. Standard insurance, coinsurance, copays and deductibles apply for chronic care management only during months in which we provide at least 20 minutes of these services. Most insurances cover these services at 100%, however patients may be responsible for any copay, coinsurance and/or deductible if applicable. This service may help you avoid the need for more expensive face-to-face services. 4. Only one practitioner may furnish and bill the service in a calendar month. 5. The patient may stop CCM services at any time (effective at the end of the month) by phone call to the office staff.  Patient agreed to services and verbal consent obtained.   The patient verbalized understanding of instructions, educational materials, and care  plan provided today and declined offer to receive copy of patient instructions, educational materials, and care plan.  Telephone follow up appointment with pharmacy team member scheduled for: 3 months  Verner Chol, [facility]  Insomnia Insomnia is a sleep disorder that makes it difficult to fall asleep or stay asleep. Insomnia can cause fatigue, low energy, difficulty concentrating, mood swings, and poor performance at work or school. There are three different ways to classify insomnia:  Difficulty falling asleep.  Difficulty staying asleep.  Waking up too early in the morning. Any type of insomnia can be long-term (chronic) or short-term (acute). Both are common. Short-term insomnia usually lasts for three months or less. Chronic insomnia occurs at least three times a week for longer than three months. What are the causes? Insomnia may be caused by another condition, situation, or substance, such as:  Anxiety.  Certain medicines.  Gastroesophageal reflux disease (GERD) or other gastrointestinal conditions.  Asthma or other breathing conditions.  Restless legs syndrome, sleep apnea, or other sleep disorders.  Chronic pain.  Menopause.  Stroke.  Abuse of alcohol, tobacco, or illegal drugs.  Mental health conditions, such as depression.  Caffeine.  Neurological disorders, such as Alzheimer's disease.  An overactive thyroid (hyperthyroidism). Sometimes, the cause of insomnia may not be known. What increases the risk? Risk factors for insomnia include:  Gender. Women are affected more often than men.  Age. Insomnia is more common as you get older.  Stress.  Lack of exercise.  Irregular work schedule or working night shifts.  Traveling between different time zones.  Certain medical and mental health conditions. What are the signs or symptoms? If you have insomnia, the main symptom is having trouble falling asleep or having trouble staying asleep. This may lead to  other symptoms, such as:  Feeling fatigued or having low energy.  Feeling nervous about going to sleep.  Not feeling rested in the morning.  Having  trouble concentrating.  Feeling irritable, anxious, or depressed. How is this diagnosed? This condition may be diagnosed based on:  Your symptoms and medical history. Your health care provider may ask about: ? Your sleep habits. ? Any medical conditions you have. ? Your mental health.  A physical exam. How is this treated? Treatment for insomnia depends on the cause. Treatment may focus on treating an underlying condition that is causing insomnia. Treatment may also include:  Medicines to help you sleep.  Counseling or therapy.  Lifestyle adjustments to help you sleep better. Follow these instructions at home: Eating and drinking  Limit or avoid alcohol, caffeinated beverages, and cigarettes, especially close to bedtime. These can disrupt your sleep.  Do not eat a large meal or eat spicy foods right before bedtime. This can lead to digestive discomfort that can make it hard for you to sleep.   Sleep habits  Keep a sleep diary to help you and your health care provider figure out what could be causing your insomnia. Write down: ? When you sleep. ? When you wake up during the night. ? How well you sleep. ? How rested you feel the next day. ? Any side effects of medicines you are taking. ? What you eat and drink.  Make your bedroom a dark, comfortable place where it is easy to fall asleep. ? Put up shades or blackout curtains to block light from outside. ? Use a white noise machine to block noise. ? Keep the temperature cool.  Limit screen use before bedtime. This includes: ? Watching TV. ? Using your smartphone, tablet, or computer.  Stick to a routine that includes going to bed and waking up at the same times every day and night. This can help you fall asleep faster. Consider making a quiet activity, such as reading,  part of your nighttime routine.  Try to avoid taking naps during the day so that you sleep better at night.  Get out of bed if you are still awake after 15 minutes of trying to sleep. Keep the lights down, but try reading or doing a quiet activity. When you feel sleepy, go back to bed.   General instructions  Take over-the-counter and prescription medicines only as told by your health care provider.  Exercise regularly, as told by your health care provider. Avoid exercise starting several hours before bedtime.  Use relaxation techniques to manage stress. Ask your health care provider to suggest some techniques that may work well for you. These may include: ? Breathing exercises. ? Routines to release muscle tension. ? Visualizing peaceful scenes.  Make sure that you drive carefully. Avoid driving if you feel very sleepy.  Keep all follow-up visits as told by your health care provider. This is important. Contact a health care provider if:  You are tired throughout the day.  You have trouble in your daily routine due to sleepiness.  You continue to have sleep problems, or your sleep problems get worse. Get help right away if:  You have serious thoughts about hurting yourself or someone else. If you ever feel like you may hurt yourself or others, or have thoughts about taking your own life, get help right away. You can go to your nearest emergency department or call:  Your local emergency services (911 in the [place]  A suicide crisis helpline, such as the National Suicide Prevention Lifeline at (856)635-2118. This is open 24 hours a day. Summary  Insomnia is a sleep disorder that makes it difficult  to fall asleep or stay asleep.  Insomnia can be long-term (chronic) or short-term (acute).  Treatment for insomnia depends on the cause. Treatment may focus on treating an underlying condition that is causing insomnia.  Keep a sleep diary to help you and your health care provider  figure out what could be causing your insomnia. This information is not intended to replace advice given to you by your health care provider. Make sure you discuss any questions you have with your health care provider. Document Revised: 12/20/2019 Document Reviewed: 12/20/2019 Elsevier Patient Education  2021 [employer]vier Inc.  High Cholesterol  High cholesterol is a condition in which the blood has high levels of a white, waxy substance similar to fat (cholesterol). The liver makes all the cholesterol that the body needs. The human body needs small amounts of cholesterol to help build cells. A person gets extra or excess cholesterol from the food that he or she eats. The blood carries cholesterol from the liver to the rest of the body. If you have high cholesterol, deposits (plaques) may build up on the walls of your arteries. Arteries are the blood vessels that carry blood away from your heart. These plaques make the arteries narrow and stiff. Cholesterol plaques increase your risk for heart attack and stroke. Work with your health care provider to keep your cholesterol levels in a healthy range. What increases the risk? The following factors may make you more likely to develop this condition:  Eating foods that are high in animal fat (saturated fat) or cholesterol.  Being overweight.  Not getting enough exercise.  A family history of high cholesterol (familial hypercholesterolemia).  Use of tobacco products.  Having diabetes. What are the signs or symptoms? There are no symptoms of this condition. How is this diagnosed? This condition may be diagnosed based on the results of a blood test.  If you are older than 74 years of age, your health care provider may check your cholesterol levels every 4-6 years.  You may be checked more often if you have high cholesterol or other risk factors for heart disease. The blood test for cholesterol measures:  "Bad" cholesterol, or LDL cholesterol.  This is the main type of cholesterol that causes heart disease. The desired level is less than 100 mg/dL.  "Good" cholesterol, or HDL cholesterol. HDL helps protect against heart disease by cleaning the arteries and carrying the LDL to the liver for processing. The desired level for HDL is 60 mg/dL or higher.  Triglycerides. These are fats that your body can store or burn for energy. The desired level is less than 150 mg/dL.  Total cholesterol. This measures the total amount of cholesterol in your blood and includes LDL, HDL, and triglycerides. The desired level is less than 200 mg/dL. How is this treated? This condition may be treated with:  Diet changes. You may be asked to eat foods that have more fiber and less saturated fats or added sugar.  Lifestyle changes. These may include regular exercise, maintaining a healthy weight, and quitting use of tobacco products.  Medicines. These are given when diet and lifestyle changes have not worked. You may be prescribed a statin medicine to help lower your cholesterol levels. Follow these instructions at home: Eating and drinking  Eat a healthy, balanced diet. This diet includes: ? Daily servings of a variety of fresh, frozen, or canned fruits and vegetables. ? Daily servings of whole grain foods that are rich in fiber. ? Foods that are low in  saturated fats and trans fats. These include poultry and fish without skin, lean cuts of meat, and low-fat dairy products. ? A variety of fish, especially oily fish that contain omega-3 fatty acids. Aim to eat fish at least 2 times a week.  Avoid foods and drinks that have added sugar.  Use healthy cooking methods, such as roasting, grilling, broiling, baking, poaching, steaming, and stir-frying. Do not fry your food except for stir-frying.   Lifestyle  Get regular exercise. Aim to exercise for a total of 150 minutes a week. Increase your activity level by doing activities such as gardening, walking, and  taking the stairs.  Do not use any products that contain nicotine or tobacco, such as cigarettes, e-cigarettes, and chewing tobacco. If you need help quitting, ask your health care provider.   General instructions  Take over-the-counter and prescription medicines only as told by your health care provider.  Keep all follow-up visits as told by your health care provider. This is important. Where to find more information  American Heart Association: www.heart.org  National Heart, Lung, and Blood Institute: [employer].iswww.nhlbi.nih.gov Contact a health care provider if:  You have trouble achieving or maintaining a healthy diet or weight.  You are starting an exercise program.  You are unable to stop smoking. Get help right away if:  You have chest pain.  You have trouble breathing.  You have any symptoms of a stroke. "BE FAST" is an easy way to remember the main warning signs of a stroke: ? B - Balance. Signs are dizziness, sudden trouble walking, or loss of balance. ? E - Eyes. Signs are trouble seeing or a sudden change in vision. ? F - Face. Signs are sudden weakness or numbness of the face, or the face or eyelid drooping on one side. ? A - Arms. Signs are weakness or numbness in an arm. This happens suddenly and usually on one side of the body. ? S - Speech. Signs are sudden trouble speaking, slurred speech, or trouble understanding what people say. ? T - Time. Time to call emergency services. Write down what time symptoms started.  You have other signs of a stroke, such as: ? A sudden, severe headache with no known cause. ? Nausea or vomiting. ? Seizure. These symptoms may represent a serious problem that is an emergency. Do not wait to see if the symptoms will go away. Get medical help right away. Call your local emergency services (911 in the [place] Do not drive yourself to the hospital. Summary  Cholesterol plaques increase your risk for heart attack and stroke. Work with your  health care provider to keep your cholesterol levels in a healthy range.  Eat a healthy, balanced diet, get regular exercise, and maintain a healthy weight.  Do not use any products that contain nicotine or tobacco, such as cigarettes, e-cigarettes, and chewing tobacco.  Get help right away if you have any symptoms of a stroke. This information is not intended to replace advice given to you by your health care provider. Make sure you discuss any questions you have with your health care provider. Document Revised: 01/08/2019 Document Reviewed: 01/08/2019 Elsevier Patient Education  2021 [employer]

## 2020-05-15 ENCOUNTER — Other Ambulatory Visit: Payer: Self-pay | Admitting: Internal Medicine

## 2020-05-15 DIAGNOSIS — E039 Hypothyroidism, unspecified: Secondary | ICD-10-CM

## 2020-05-16 ENCOUNTER — Other Ambulatory Visit (INDEPENDENT_AMBULATORY_CARE_PROVIDER_SITE_OTHER): Payer: Medicare Other

## 2020-05-16 ENCOUNTER — Other Ambulatory Visit: Payer: Self-pay

## 2020-05-16 DIAGNOSIS — E119 Type 2 diabetes mellitus without complications: Secondary | ICD-10-CM

## 2020-05-16 DIAGNOSIS — E039 Hypothyroidism, unspecified: Secondary | ICD-10-CM | POA: Diagnosis not present

## 2020-05-16 LAB — BASIC METABOLIC PANEL
BUN: 25 mg/dL — ABNORMAL HIGH (ref 6–23)
CO2: 28 mEq/L (ref 19–32)
Calcium: 9.8 mg/dL (ref 8.4–10.5)
Chloride: 101 mEq/L (ref 96–112)
Creatinine, Ser: 0.95 mg/dL (ref 0.40–1.20)
GFR: 59.15 mL/min — ABNORMAL LOW (ref >60.00)
Glucose, Bld: 98 mg/dL (ref 70–99)
Potassium: 4.1 mEq/L (ref 3.5–5.1)
Sodium: 141 mEq/L (ref 135–145)

## 2020-05-16 LAB — TSH: TSH: 0.9 u[IU]/mL (ref 0.35–4.50)

## 2020-05-16 NOTE — Addendum Note (Signed)
Addended by: Leonette Nutting on: 05/16/2020 10:04 AM   Modules accepted: Orders

## 2020-05-19 DIAGNOSIS — M4316 Spondylolisthesis, lumbar region: Secondary | ICD-10-CM | POA: Diagnosis not present

## 2020-05-19 DIAGNOSIS — M545 Low back pain, unspecified: Secondary | ICD-10-CM | POA: Diagnosis not present

## 2020-05-19 DIAGNOSIS — G8929 Other chronic pain: Secondary | ICD-10-CM | POA: Diagnosis not present

## 2020-05-21 DIAGNOSIS — H353211 Exudative age-related macular degeneration, right eye, with active choroidal neovascularization: Secondary | ICD-10-CM | POA: Diagnosis not present

## 2020-05-21 DIAGNOSIS — H35372 Puckering of macula, left eye: Secondary | ICD-10-CM | POA: Diagnosis not present

## 2020-05-21 DIAGNOSIS — H353123 Nonexudative age-related macular degeneration, left eye, advanced atrophic without subfoveal involvement: Secondary | ICD-10-CM | POA: Diagnosis not present

## 2020-05-21 DIAGNOSIS — H43813 Vitreous degeneration, bilateral: Secondary | ICD-10-CM | POA: Diagnosis not present

## 2020-05-29 ENCOUNTER — Other Ambulatory Visit: Payer: Self-pay | Admitting: Internal Medicine

## 2020-05-29 DIAGNOSIS — E559 Vitamin D deficiency, unspecified: Secondary | ICD-10-CM

## 2020-05-30 ENCOUNTER — Encounter: Payer: Self-pay | Admitting: Internal Medicine

## 2020-05-30 DIAGNOSIS — F419 Anxiety disorder, unspecified: Secondary | ICD-10-CM

## 2020-05-30 MED ORDER — ALPRAZOLAM 0.5 MG PO TABS: 0.5000 mg | ORAL_TABLET | Freq: Two times a day (BID) | ORAL | 2 refills | Status: DC | PRN

## 2020-06-02 ENCOUNTER — Other Ambulatory Visit: Payer: Self-pay | Admitting: Internal Medicine

## 2020-06-05 ENCOUNTER — Telehealth: Payer: Self-pay | Admitting: Pharmacist

## 2020-06-09 ENCOUNTER — Encounter: Payer: Self-pay | Admitting: Internal Medicine

## 2020-06-10 ENCOUNTER — Other Ambulatory Visit: Payer: Self-pay | Admitting: Internal Medicine

## 2020-06-10 DIAGNOSIS — M48062 Spinal stenosis, lumbar region with neurogenic claudication: Secondary | ICD-10-CM

## 2020-06-10 MED ORDER — TIZANIDINE HCL 4 MG PO TABS: 4.0000 mg | ORAL_TABLET | Freq: Three times a day (TID) | ORAL | 1 refills | Status: DC | PRN

## 2020-06-10 NOTE — Chronic Care Management (AMB) (Signed)
Chronic Care Management Pharmacy Assistant   Name: Delaila Nand  MRN: [id number] DOB: February 13, 1946  Reason for Encounter: Disease State   Conditions to be addressed/monitored: HTN and HLD  Recent office visits:  None  Recent consult visits:  None  Hospital visits:  None in previous 6 months  Medications: Outpatient Encounter Medications as of 06/05/2020  Medication Sig  . ALPRAZolam (XANAX) 0.5 MG tablet Take 1 tablet (0.5 mg total) by mouth 2 (two) times daily as needed. for anxiety  . atenolol (TENORMIN) 25 MG tablet TAKE 1 TABLET BY MOUTH  DAILY  . atorvastatin (LIPITOR) 40 MG tablet TAKE 1 TABLET BY MOUTH  DAILY  . Calcium Carb-Cholecalciferol (CALCIUM 600/VITAMIN D3 PO) Take 1 tablet by mouth 2 (two) times daily.  . Carboxymethylcellulose Sodium (LUBRICANT EYE DROPS OP) Apply 2 drops to eye 4 (four) times daily as needed (dry eyes).   . citalopram (CELEXA) 40 MG tablet TAKE 1 TABLET BY MOUTH  DAILY  . docusate sodium (COLACE) 100 MG capsule Take 200 mg by mouth at bedtime.   . ergocalciferol (VITAMIN D2) 1.25 MG (50000 UT) capsule Take 50,000 Units by mouth once a week.  . estradiol (ESTRACE) 0.1 MG/GM vaginal cream PLACE 1 APPLICATORFUL  VAGINALLY 3 TIMES A WEEK. (Patient not taking: Reported on 05/12/2020)  . gemfibrozil (LOPID) 600 MG tablet TAKE 1 TABLET BY MOUTH  TWICE DAILY BEFORE MEALS  . HYDROcodone-acetaminophen (NORCO) 7.5-325 MG tablet Take 1 tablet by mouth every 6 (six) hours as needed for moderate pain.  . hydrocortisone 2.5 % cream APPLY TO AFFECTED AREA(S)  TOPICALLY TWICE DAILY  . Lancets (ONETOUCH ULTRASOFT) lancets Use daily  . levothyroxine (SYNTHROID) 137 MCG tablet Take 1 tablet (137 mcg total) by mouth daily.  Marland Kitchen lisinopril (ZESTRIL) 10 MG tablet TAKE 1 TABLET BY MOUTH  DAILY (Patient not taking: Reported on 05/12/2020)  . loratadine (CLARITIN) 10 MG tablet Take 10 mg by mouth daily as needed for allergies. (Patient not taking: Reported on  05/12/2020)  . meloxicam (MOBIC) 15 MG tablet TAKE 1 TABLET BY MOUTH  DAILY  . metFORMIN (GLUCOPHAGE) 1000 MG tablet TAKE 1 TABLET BY MOUTH TWO  TIMES DAILY WITH MEALS  . Misc. Devices (BARIATRIC ROLLATOR) MISC 1 Rollator with basket.  . Multiple Vitamin (MULTIVITAMIN) tablet Take 1 tablet by mouth daily.  . Omega-3 Fatty Acids (FISH OIL) 1200 MG CAPS Take 1 capsule by mouth daily.   . ONE TOUCH ULTRA TEST test strip USE 1 DAILY AS DIRECTED  . pantoprazole (PROTONIX) 40 MG tablet TAKE 1 TABLET BY MOUTH  TWICE DAILY BEFORE MEALS  . pioglitazone (ACTOS) 15 MG tablet TAKE 1 TABLET BY MOUTH  DAILY  . pregabalin (LYRICA) 75 MG capsule Take 1 capsule (75 mg total) by mouth daily.  Marland Kitchen rOPINIRole (REQUIP) 1 MG tablet TAKE 1 TABLET BY MOUTH AT  BEDTIME  . tiZANidine (ZANAFLEX) 4 MG tablet Take 1 tablet (4 mg total) by mouth 3 (three) times daily as needed.  . tobramycin (TOBREX) 0.3 % ophthalmic solution Place 1 drop into both eyes every 6 (six) hours. Day before injection, day of injection and day after injection  . traZODone (DESYREL) 100 MG tablet TAKE 1 TO 2 TABLETS BY  MOUTH AT BEDTIME   No facility-administered encounter medications on file as of 06/05/2020.   Reviewed chart prior to disease state call. Spoke with patient regarding BP  Recent Office Vitals: BP Readings from Last 3 Encounters:  03/27/20 120/80  12/05/19 118/80  08/15/18 110/68   Pulse Readings from Last 3 Encounters:  03/27/20 67  12/05/19 78  08/15/18 83    Wt Readings from Last 3 Encounters:  03/27/20 196 lb 3.2 oz (89 kg)  03/26/20 191 lb (86.6 kg)  12/05/19 202 lb (91.6 kg)     Kidney Function Lab Results  Component Value Date/Time   CREATININE 0.95 05/16/2020 10:07 AM   CREATININE 1.25 (H) 12/05/2019 09:41 AM   CREATININE 0.89 08/15/2018 09:25 AM   GFR 59.15 (L) 05/16/2020 10:07 AM   GFRNONAA >60 12/28/2017 05:24 AM   GFRAA >60 12/28/2017 05:24 AM    BMP Latest Ref Rng & Units 05/16/2020 12/05/2019  08/15/2018  Glucose 70 - 99 mg/dL 98 83 91  BUN 6 - 23 mg/dL 81(E) 56(D) 14(H)  Creatinine 0.40 - 1.20 mg/dL 7.02 6.37(C) 5.88  BUN/Creat Ratio 6 - 22 (calc) - 25(H) -  Sodium 135 - 145 mEq/L 141 138 138  Potassium 3.5 - 5.1 mEq/L 4.1 5.6(H) 4.8  Chloride 96 - 112 mEq/L 101 103 101  CO2 19 - 32 mEq/L 28 25 27   Calcium 8.4 - 10.5 mg/dL 9.8 9.4 9.4    . Current antihypertensive regimen:   Atenolol 25 mg 1 tablet daily - in AM  Lisinopril 10 mg 1 tablet daily - in AM  . How often are you checking your Blood Pressure? not taking currently . Current home BP readings: None . What recent interventions/DTPs have been made by any provider to improve Blood Pressure control since last CPP Visit: Restarted Lisinopril on 04.10.2022 . Any recent hospitalizations or ED visits since last visit with CPP? No . What diet changes have been made to improve Blood Pressure Control?  o She has increased her water intake . What exercise is being done to improve your Blood Pressure Control?  o No changes  Adherence Review: Is the patient currently on ACE/ARB medication? No Does the patient have >5 day gap between last estimated fill dates? No  06/10/2020 Name: Jatavia Keltner MRN: [name] DOB: [birth year] Brandyn Thien is a 75 y.o. year old female who is a primary care patient of 67, Philip Aspen, MD.  Comprehensive medication review performed; Spoke to patient regarding cholesterol  Lipid Panel    Component Value Date/Time   CHOL 157 12/05/2019 0941   TRIG 203 (H) 12/05/2019 0941   HDL 62 12/05/2019 0941   LDLCALC 68 12/05/2019 0941   LDLDIRECT 70.0 08/15/2018 0925    10-year ASCVD risk score: The 10-year ASCVD risk score 08/17/2018 DC Denman George., et al., 2013) is: 29.3%   Values used to calculate the score:     Age: 35 years     Sex: Female     Is Non-Hispanic African American: No     Diabetic: Yes     Tobacco smoker: No     Systolic Blood Pressure: 120 mmHg     Is BP treated: Yes      HDL Cholesterol: 62 mg/dL     Total Cholesterol: 157 mg/dL  . Current antihyperlipidemic regimen:   Atorvastatin 40 mg 1 tablet daily  Gemfibrozil 600 mg 1 tablet twice daily before meals  Fish oil 1200 mg 1 capsule daily  . Previous antihyperlipidemic medications tried: None . ASCVD risk enhancing conditions: HTN . What recent interventions/DTPs have been made by any provider to improve Cholesterol control since last CPP Visit: None . Any recent hospitalizations or ED visits since last visit with CPP? No . What diet  changes have been made to improve Cholesterol?  o She has increased her water intake . What exercise is being done to improve Cholesterol?  o No Changes  Adherence Review: Does the patient have >5 day gap between last estimated fill dates? No   I spoke with the patient and review medication inherence. She did have a few changes since her last visit. She stated that she restarted her Lisinopril 10 mg daily. Her lab results showed that her kidney functions were much better period she stated that she has increased her water intake. She's also purchased some calcium citrate. She states that she has not purchased an arm blood pressure cuff yet. She states that she has been under the weather for the past week and has not been out. She states that she will work on her diet for her blood pressure and cholesterol. She did have a few questions period: 1. should she be taking a baby aspirin? 2. what the reason was why she could not take Benadryl 3. if she discontinues Xanax, what other medication can she go on 4. Is there help with the [facility] for name-brand fish oil? There have been no other changes to her medication she denies any side effects from her current medication there have been no emergency department visits or urgent care visits since her last CPP or PCP visit.  Star Rating Drugs:  Dispensed Quantity Pharmacy  Atorvastatin 04.14.2022 90 [facility]  Metformin  02.15.2022 90 [facility]  Lisinopril 02.23.2022 90  [facility]    Amilia Tomma Rakers, [facility] Health Concierge [phone]

## 2020-06-12 ENCOUNTER — Telehealth (INDEPENDENT_AMBULATORY_CARE_PROVIDER_SITE_OTHER): Payer: Medicare Other | Admitting: Family Medicine

## 2020-06-12 DIAGNOSIS — U071 COVID-19: Secondary | ICD-10-CM | POA: Diagnosis not present

## 2020-06-12 NOTE — Progress Notes (Signed)
Virtual Visit via Video Note  I connected with Katie Booker  on 06/12/20 at  3:40 PM EDT by a video enabled telemedicine application and verified that I am speaking with the correct person using two identifiers.  Location patient: home, [place] Location provider:work or home office Persons participating in the virtual visit: patient, provider  I discussed the limitations of evaluation and management by telemedicine and the availability of in person appointments. The patient expressed understanding and agreed to proceed.   HPI:  Acute telemedicine visit for Covid19: -Onset: Tuesday the 12th of April had a sore throat for a few days, then developed a cold -had a positive covid test today -Symptoms include: sore throat the first few days, nasal congestion, cough, had a little pain in back and chest the first few days - now resolved, some tender glands, fevers, feels tired still -doing much better, most symptoms resolving -Denies: CP now, SOB, NVD, inability to eat/drink/get out of bed -Has tried: musinex -Pertinent past medical history:HTN, OSA, DM, Obesity -Pertinent medication allergies: morphine, betadine, advil sinus, hydromorphone -COVID-19 vaccine status: pfizer 2 doses plus booster; had flu shot this season as well  ROS: See pertinent positives and negatives per HPI.  Past Medical History:  Diagnosis Date  . Age-related macular degeneration, wet, right eye (HCC)    per pt has had treatment in past  . Anxiety   . Chronic constipation   . Chronic low back pain   . Chronic neck pain   . 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 Depression   . DOE (dyspnea on exertion)   . GERD   . Headache   . Hemorrhoids   . History of basal cell carcinoma excision    left cheek  . History of colon polyps   . History of hyperthyroidism 2011   RAI treatement  . History of panic attacks   . Hyperlipidemia   . Hypertension   . Hypothyroidism,  postradioiodine therapy    followed by pcp  . IDA (iron deficiency anemia)    intermittant  . Insomnia   . OA (osteoarthritis)   . OSA (obstructive sleep apnea)    no cpap, per pt tried unable to tolerate  . Peripheral neuropathy    legs and feet from hx back surgery's  . Rash    bilateral axilla area -- per pt due to some personal wipes used other the counter  . RLS (restless legs syndrome)   . Spondylolisthesis at L1-L2 level   . Tingling of both upper extremities    left greater than right due to cervical pinched nerve  . Type 2 diabetes mellitus (HCC)    followed by pcp  . Umbilical hernia   . Urinary frequency   . Urinary urgency     Past Surgical History:  Procedure Laterality Date  . ANTERIOR CERVICAL DECOMP/DISCECTOMY FUSION N/A 09/02/2014   Procedure: Cervical five- six  Anterior cervical decompression fusion;  Surgeon: Barnett Abu, MD;  Location: [facility];  Service: Neurosurgery;  Laterality: N/A;  C5-6 Anterior cervical decompression/diskectomy/fusion  . BREAST BIOPSY Right 10/2018   x 2 benign  . CARPAL TUNNEL RELEASE Right 2016  . CATARACT EXTRACTION W/ INTRAOCULAR LENS  IMPLANT, BILATERAL  03/2015  . COLONOSCOPY  2007  . ESOPHAGOGASTRODUODENOSCOPY  2007  . HEMORRHOID SURGERY  03/23/2011   Procedure: HEMORRHOIDECTOMY;  Surgeon: Clovis Pu. Cornett, MD;  Location: [facility];  Service: General;  Laterality: N/A;  lateral internal sphincterotomy and hemorrhoidectomy  . LUMBAR FUSION  02/2015   L1 -- 2  . PARTIAL KNEE ARTHROPLASTY Left 12/27/2017   Procedure: UNICOMPARTMENTAL LEFT KNEE;  Surgeon: Teryl Lucy, MD;  Location: [facility];  Service: Orthopedics;  Laterality: Left;  . THORACIC DISCECTOMY N/A 12/15/2012   Procedure: Thoracic ten-eleven Thoracic laminectomy;  Surgeon: Barnett Abu, MD;  Location: [facility];  Service: Neurosurgery;  Laterality: N/A;  Thoracic ten-eleven Thoracic laminectomy  . TUBAL LIGATION Bilateral 1991     Current  Outpatient Medications:  .  ALPRAZolam (XANAX) 0.5 MG tablet, Take 1 tablet (0.5 mg total) by mouth 2 (two) times daily as needed. for anxiety, Disp: 60 tablet, Rfl: 2 .  atenolol (TENORMIN) 25 MG tablet, TAKE 1 TABLET BY MOUTH  DAILY, Disp: 90 tablet, Rfl: 1 .  atorvastatin (LIPITOR) 40 MG tablet, TAKE 1 TABLET BY MOUTH  DAILY, Disp: 90 tablet, Rfl: 1 .  Calcium Carb-Cholecalciferol (CALCIUM 600/VITAMIN D3 PO), Take 1 tablet by mouth 2 (two) times daily., Disp: , Rfl:  .  Carboxymethylcellulose Sodium (LUBRICANT EYE DROPS OP), Apply 2 drops to eye 4 (four) times daily as needed (dry eyes). , Disp: , Rfl:  .  citalopram (CELEXA) 40 MG tablet, TAKE 1 TABLET BY MOUTH  DAILY, Disp: 90 tablet, Rfl: 3 .  docusate sodium (COLACE) 100 MG capsule, Take 200 mg by mouth at bedtime. , Disp: , Rfl:  .  ergocalciferol (VITAMIN D2) 1.25 MG (50000 UT) capsule, Take 50,000 Units by mouth once a week., Disp: , Rfl:  .  estradiol (ESTRACE) 0.1 MG/GM vaginal cream, PLACE 1 APPLICATORFUL  VAGINALLY 3 TIMES A WEEK. (Patient not taking: Reported on 05/12/2020), Disp: 170 g, Rfl: 3 .  gemfibrozil (LOPID) 600 MG tablet, TAKE 1 TABLET BY MOUTH  TWICE DAILY BEFORE MEALS, Disp: 180 tablet, Rfl: 3 .  HYDROcodone-acetaminophen (NORCO) 7.5-325 MG tablet, Take 1 tablet by mouth every 6 (six) hours as needed for moderate pain., Disp: 120 tablet, Rfl: 0 .  hydrocortisone 2.5 % cream, APPLY TO AFFECTED AREA(S)  TOPICALLY TWICE DAILY, Disp: 85.05 g, Rfl: 1 .  Lancets (ONETOUCH ULTRASOFT) lancets, Use daily, Disp: 100 each, Rfl: 6 .  levothyroxine (SYNTHROID) 137 MCG tablet, Take 1 tablet (137 mcg total) by mouth daily., Disp: 90 tablet, Rfl: 1 .  lisinopril (ZESTRIL) 10 MG tablet, TAKE 1 TABLET BY MOUTH  DAILY (Patient not taking: Reported on 05/12/2020), Disp: 90 tablet, Rfl: 3 .  loratadine (CLARITIN) 10 MG tablet, Take 10 mg by mouth daily as needed for allergies. (Patient not taking: Reported on 05/12/2020), Disp: , Rfl:  .   meloxicam (MOBIC) 15 MG tablet, TAKE 1 TABLET BY MOUTH  DAILY, Disp: 90 tablet, Rfl: 3 .  metFORMIN (GLUCOPHAGE) 1000 MG tablet, TAKE 1 TABLET BY MOUTH TWO  TIMES DAILY WITH MEALS, Disp: 180 tablet, Rfl: 3 .  Misc. Devices (BARIATRIC ROLLATOR) MISC, 1 Rollator with basket., Disp: 1 each, Rfl: 0 .  Multiple Vitamin (MULTIVITAMIN) tablet, Take 1 tablet by mouth daily., Disp: , Rfl:  .  Omega-3 Fatty Acids (FISH OIL) 1200 MG CAPS, Take 1 capsule by mouth daily. , Disp: , Rfl:  .  ONE TOUCH ULTRA TEST test strip, USE 1 DAILY AS DIRECTED, Disp: 100 each, Rfl: 3 .  pantoprazole (PROTONIX) 40 MG tablet, TAKE 1 TABLET BY MOUTH  TWICE DAILY BEFORE MEALS, Disp: 180 tablet, Rfl: 1 .  pioglitazone (ACTOS) 15 MG tablet, TAKE 1 TABLET BY MOUTH  DAILY, Disp: 90 tablet, Rfl: 3 .  pregabalin (LYRICA) 75 MG capsule, Take 1 capsule (75 mg total) by mouth daily., Disp: 90 capsule, Rfl: 1 .  rOPINIRole (REQUIP) 1 MG tablet, TAKE 1 TABLET BY MOUTH AT  BEDTIME, Disp: 90 tablet, Rfl: 3 .  tiZANidine (ZANAFLEX) 4 MG tablet, Take 1 tablet (4 mg total) by mouth 3 (three) times daily as needed., Disp: 180 tablet, Rfl: 1 .  tobramycin (TOBREX) 0.3 % ophthalmic solution, Place 1 drop into both eyes every 6 (six) hours. Day before injection, day of injection and day after injection, Disp: , Rfl:  .  traZODone (DESYREL) 100 MG tablet, TAKE 1 TO 2 TABLETS BY  MOUTH AT BEDTIME, Disp: 180 tablet, Rfl: 3  EXAM:  VITALS per patient if applicable:  GENERAL: alert, oriented, appears well and in no acute distress  HEENT: atraumatic, conjunttiva clear, no obvious abnormalities on inspection of external nose and ears  NECK: normal movements of the head and neck  LUNGS: on inspection no signs of respiratory distress, breathing rate appears normal, no obvious gross SOB, gasping or wheezing  CV: no obvious cyanosis  MS: moves all visible extremities without noticeable abnormality  PSYCH/NEURO: pleasant and cooperative, no obvious  depression or anxiety, speech and thought processing grossly intact  ASSESSMENT AND PLAN:  Discussed the following assessment and plan:  COVID-19  -we discussed possible serious and likely etiologies, options for evaluation and workup, limitations of telemedicine visit vs in person visit, treatment, treatment risks and precautions. Pt prefers to treat via telemedicine empirically rather than in person at this moment. It seems like per her report she almost to day 10 of a mild covid illness and most symptoms have improved significantly or resolved. Discussed treatment options, symptomatic care summarized in patient instructions, testing, isolation, potential complications and precautions. Scheduled follow up with PCP offered: as needed Advised to seek prompt in person care if worsening, new symptoms arise, or if is not improving with treatment. Discussed options for inperson care if PCP office not available. Did let this patient know that I only do telemedicine on Tuesdays and Thursdays for [place]. Advised to schedule follow up visit with PCP or [facility] if any further questions or concerns to avoid delays in care.   I discussed the assessment and treatment plan with the patient. About 22 minutes spent on this appointment in chart review, assessment and exam and counseling. The patient was provided an opportunity to ask questions and all were answered. The patient agreed with the plan and demonstrated an understanding of the instructions.     Terressa Koyanagi, DO

## 2020-06-12 NOTE — Patient Instructions (Signed)
  HOME CARE TIPS:  -can use tylenol if needed for fevers, aches and pains per instructions  -can use nasal saline a few times per day if you have nasal congestion  -stay hydrated, drink plenty of fluids and eat small healthy meals - avoid dairy  -can take 1000 IU ( ) Vit D3 and 100-500 mg of Vit C daily per instructions  -follow up with your doctor in 2-3 days unless improving and feeling better  -stay home while sick, except to seek medical care, and if you have COVID19 ideally it would be best to stay home for a full 10 days since the onset of symptoms PLUS one day of no fever and feeling better. Wear a good mask (such as N95 or KN95) if around others to reduce the risk of transmission.  It was nice to meet you today, and I really hope you are feeling better soon. I help [facility] [facility] out with telemedicine visits on Tuesdays and Thursdays and am available for visits on those days. If you have any concerns or questions following this visit please schedule a follow up visit with your Primary Care doctor or seek care at a local urgent care clinic to avoid delays in care.    Seek in person care or schedule a follow up video visit promptly if your symptoms worsen, new concerns arise or you are not improving with treatment. Call 911 and/or seek emergency care if your symptoms are severe or life threatening.

## 2020-06-16 ENCOUNTER — Other Ambulatory Visit: Payer: Self-pay

## 2020-06-19 ENCOUNTER — Telehealth: Payer: Self-pay | Admitting: Internal Medicine

## 2020-06-19 ENCOUNTER — Encounter: Payer: Self-pay | Admitting: Internal Medicine

## 2020-06-19 DIAGNOSIS — M48062 Spinal stenosis, lumbar region with neurogenic claudication: Secondary | ICD-10-CM

## 2020-06-19 NOTE — Telephone Encounter (Signed)
Pt call and stated she need a refill on HYDROcodone-acetaminophen (NORCO) 7.5-325 MG tablet sent to  [id number] [place], [place] - [address] Phone:  [phone]  Fax:  [phone]

## 2020-06-20 NOTE — Telephone Encounter (Signed)
Okay to send in 30 days until next appointment?

## 2020-06-21 ENCOUNTER — Other Ambulatory Visit: Payer: Self-pay | Admitting: Internal Medicine

## 2020-06-21 DIAGNOSIS — G8929 Other chronic pain: Secondary | ICD-10-CM

## 2020-06-24 ENCOUNTER — Encounter: Payer: Self-pay | Admitting: Internal Medicine

## 2020-06-24 ENCOUNTER — Telehealth (INDEPENDENT_AMBULATORY_CARE_PROVIDER_SITE_OTHER): Payer: Medicare Other | Admitting: Internal Medicine

## 2020-06-24 VITALS — Wt 193.0 lb

## 2020-06-24 DIAGNOSIS — M48062 Spinal stenosis, lumbar region with neurogenic claudication: Secondary | ICD-10-CM | POA: Diagnosis not present

## 2020-06-24 DIAGNOSIS — M549 Dorsalgia, unspecified: Secondary | ICD-10-CM

## 2020-06-24 DIAGNOSIS — G8929 Other chronic pain: Secondary | ICD-10-CM | POA: Diagnosis not present

## 2020-06-24 MED ORDER — HYDROCODONE-ACETAMINOPHEN 7.5-325 MG PO TABS: 1.0000 | ORAL_TABLET | Freq: Four times a day (QID) | ORAL | 0 refills | Status: DC | PRN

## 2020-06-24 MED ORDER — PREGABALIN 75 MG PO CAPS: 75.0000 mg | ORAL_CAPSULE | Freq: Every day | ORAL | 1 refills | Status: DC

## 2020-06-24 NOTE — Telephone Encounter (Signed)
Patient had a virtual visit 06/24/20 and received refills.

## 2020-06-24 NOTE — Progress Notes (Signed)
Virtual Visit via Video Note  I connected with Katie Booker on 06/24/20 at 10:30 AM EDT by a video enabled telemedicine application and verified that I am speaking with the correct person using two identifiers.  Location patient: home Location provider: work office Persons participating in the virtual visit: patient, provider  I discussed the limitations of evaluation and management by telemedicine and the availability of in person appointments. The patient expressed understanding and agreed to proceed.   HPI: She has scheduled this visit for controlled substance prescription per pain contract.  She has a history of chronic back pain due to spinal stenosis and spondylolisthesis.  She is on Norco 7.5/325 mg that she takes every 6 hours.  She is also due for refill of her Lyrica 75 mg that she takes daily.  She has been tolerating medications well.  She has no acute complaints today.  She was diagnosed with COVID-19 on April 21.  She has recovered well.  She did not have any antiviral treatment.  She is wondering when she can have her fourth COVID booster.   ROS: Constitutional: Denies fever, chills, diaphoresis, appetite change and fatigue.  HEENT: Denies photophobia, eye pain, redness, hearing loss, ear pain, congestion, sore throat, rhinorrhea, sneezing, mouth sores, trouble swallowing, neck pain, neck stiffness and tinnitus.   Respiratory: Denies SOB, DOE, cough, chest tightness,  and wheezing.   Cardiovascular: Denies chest pain, palpitations and leg swelling.  Gastrointestinal: Denies nausea, vomiting, abdominal pain, diarrhea, constipation, blood in stool and abdominal distention.  Genitourinary: Denies dysuria, urgency, frequency, hematuria, flank pain and difficulty urinating.  Endocrine: Denies: hot or cold intolerance, sweats, changes in hair or nails, polyuria, polydipsia. Musculoskeletal: Denies myalgias, back pain, joint swelling, arthralgias and gait problem.  Skin:  Denies pallor, rash and wound.  Neurological: Denies dizziness, seizures, syncope, weakness, light-headedness, numbness and headaches.  Hematological: Denies adenopathy. Easy bruising, personal or family bleeding history  Psychiatric/Behavioral: Denies suicidal ideation, mood changes, confusion, nervousness, sleep disturbance and agitation   Past Medical History:  Diagnosis Date  . Age-related macular degeneration, wet, right eye (HCC)    per pt has had treatment in past  . Anxiety   . Chronic constipation   . Chronic low back pain   . Chronic neck pain   . 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 Depression   . DOE (dyspnea on exertion)   . GERD   . Headache   . Hemorrhoids   . History of basal cell carcinoma excision    left cheek  . History of colon polyps   . History of hyperthyroidism 2011   RAI treatement  . History of panic attacks   . Hyperlipidemia   . Hypertension   . Hypothyroidism, postradioiodine therapy    followed by pcp  . IDA (iron deficiency anemia)    intermittant  . Insomnia   . OA (osteoarthritis)   . OSA (obstructive sleep apnea)    no cpap, per pt tried unable to tolerate  . Peripheral neuropathy    legs and feet from hx back surgery's  . Rash    bilateral axilla area -- per pt due to some personal wipes used other the counter  . RLS (restless legs syndrome)   . Spondylolisthesis at L1-L2 level   . Tingling of both upper extremities    left greater than right due to cervical pinched nerve  . Type 2 diabetes mellitus (  HCC)    followed by pcp  . Umbilical hernia   . Urinary frequency   . Urinary urgency     Past Surgical History:  Procedure Laterality Date  . ANTERIOR CERVICAL DECOMP/DISCECTOMY FUSION N/A 09/02/2014   Procedure: Cervical five- six  Anterior cervical decompression fusion;  Surgeon: Barnett Abu, MD;  Location: [facility];  Service: Neurosurgery;  Laterality: N/A;  C5-6  Anterior cervical decompression/diskectomy/fusion  . BREAST BIOPSY Right 10/2018   x 2 benign  . CARPAL TUNNEL RELEASE Right 2016  . CATARACT EXTRACTION W/ INTRAOCULAR LENS  IMPLANT, BILATERAL  03/2015  . COLONOSCOPY  2007  . ESOPHAGOGASTRODUODENOSCOPY  2007  . HEMORRHOID SURGERY  03/23/2011   Procedure: HEMORRHOIDECTOMY;  Surgeon: Clovis Pu. Cornett, MD;  Location: [facility];  Service: General;  Laterality: N/A;  lateral internal sphincterotomy and hemorrhoidectomy  . LUMBAR FUSION  02/2015   L1 -- 2  . PARTIAL KNEE ARTHROPLASTY Left 12/27/2017   Procedure: UNICOMPARTMENTAL LEFT KNEE;  Surgeon: Teryl Lucy, MD;  Location: [facility];  Service: Orthopedics;  Laterality: Left;  . THORACIC DISCECTOMY N/A 12/15/2012   Procedure: Thoracic ten-eleven Thoracic laminectomy;  Surgeon: Barnett Abu, MD;  Location: [facility];  Service: Neurosurgery;  Laterality: N/A;  Thoracic ten-eleven Thoracic laminectomy  . TUBAL LIGATION Bilateral 1991    Family History  Problem Relation Age of Onset  . Diabetes Father   . COPD Father   . Cancer Maternal Grandfather        stomach    SOCIAL HX:   reports that she quit smoking about 16 years ago. Her smoking use included cigarettes. She has a 20.00 pack-year smoking history. She has never used smokeless tobacco. She reports current alcohol use. She reports that she does not use drugs.   Current Outpatient Medications:  .  ALPRAZolam (XANAX) 0.5 MG tablet, Take 1 tablet (0.5 mg total) by mouth 2 (two) times daily as needed. for anxiety, Disp: 60 tablet, Rfl: 2 .  atenolol (TENORMIN) 25 MG tablet, TAKE 1 TABLET BY MOUTH  DAILY, Disp: 90 tablet, Rfl: 1 .  atorvastatin (LIPITOR) 40 MG tablet, TAKE 1 TABLET BY MOUTH  DAILY, Disp: 90 tablet, Rfl: 1 .  Calcium Carb-Cholecalciferol (CALCIUM 600/VITAMIN D3 PO), Take 1 tablet by mouth 2 (two) times daily., Disp: , Rfl:  .  Carboxymethylcellulose Sodium (LUBRICANT EYE DROPS OP), Apply 2 drops to eye  4 (four) times daily as needed (dry eyes). , Disp: , Rfl:  .  citalopram (CELEXA) 40 MG tablet, TAKE 1 TABLET BY MOUTH  DAILY, Disp: 90 tablet, Rfl: 3 .  docusate sodium (COLACE) 100 MG capsule, Take 200 mg by mouth at bedtime. , Disp: , Rfl:  .  ergocalciferol (VITAMIN D2) 1.25 MG (50000 UT) capsule, Take 50,000 Units by mouth once a week., Disp: , Rfl:  .  estradiol (ESTRACE) 0.1 MG/GM vaginal cream, PLACE 1 APPLICATORFUL  VAGINALLY 3 TIMES A WEEK., Disp: 170 g, Rfl: 3 .  gemfibrozil (LOPID) 600 MG tablet, TAKE 1 TABLET BY MOUTH  TWICE DAILY BEFORE MEALS, Disp: 180 tablet, Rfl: 3 .  HYDROcodone-acetaminophen (NORCO) 7.5-325 MG tablet, Take 1 tablet by mouth every 6 (six) hours as needed for moderate pain., Disp: 120 tablet, Rfl: 0 .  HYDROcodone-acetaminophen (NORCO) 7.5-325 MG tablet, Take 1 tablet by mouth every 6 (six) hours as needed for moderate pain., Disp: 120 tablet, Rfl: 0 .  hydrocortisone 2.5 % cream, APPLY TO AFFECTED AREA(S)  TOPICALLY TWICE DAILY, Disp: 85.05  g, Rfl: 1 .  Lancets (ONETOUCH ULTRASOFT) lancets, Use daily, Disp: 100 each, Rfl: 6 .  levothyroxine (SYNTHROID) 137 MCG tablet, Take 1 tablet (137 mcg total) by mouth daily., Disp: 90 tablet, Rfl: 1 .  lisinopril (ZESTRIL) 10 MG tablet, TAKE 1 TABLET BY MOUTH  DAILY, Disp: 90 tablet, Rfl: 3 .  loratadine (CLARITIN) 10 MG tablet, Take 10 mg by mouth daily as needed for allergies., Disp: , Rfl:  .  meloxicam (MOBIC) 15 MG tablet, TAKE 1 TABLET BY MOUTH  DAILY, Disp: 90 tablet, Rfl: 3 .  metFORMIN (GLUCOPHAGE) 1000 MG tablet, TAKE 1 TABLET BY MOUTH TWO  TIMES DAILY WITH MEALS, Disp: 180 tablet, Rfl: 3 .  Misc. Devices (BARIATRIC ROLLATOR) MISC, 1 Rollator with basket., Disp: 1 each, Rfl: 0 .  Multiple Vitamin (MULTIVITAMIN) tablet, Take 1 tablet by mouth daily., Disp: , Rfl:  .  Omega-3 Fatty Acids (FISH OIL) 1200 MG CAPS, Take 1 capsule by mouth daily. , Disp: , Rfl:  .  ONE TOUCH ULTRA TEST test strip, USE 1 DAILY AS DIRECTED,  Disp: 100 each, Rfl: 3 .  pantoprazole (PROTONIX) 40 MG tablet, TAKE 1 TABLET BY MOUTH  TWICE DAILY BEFORE MEALS, Disp: 180 tablet, Rfl: 1 .  pioglitazone (ACTOS) 15 MG tablet, TAKE 1 TABLET BY MOUTH  DAILY, Disp: 90 tablet, Rfl: 3 .  rOPINIRole (REQUIP) 1 MG tablet, TAKE 1 TABLET BY MOUTH AT  BEDTIME, Disp: 90 tablet, Rfl: 3 .  tiZANidine (ZANAFLEX) 4 MG tablet, Take 1 tablet (4 mg total) by mouth 3 (three) times daily as needed., Disp: 180 tablet, Rfl: 1 .  tobramycin (TOBREX) 0.3 % ophthalmic solution, Place 1 drop into both eyes every 6 (six) hours. Day before injection, day of injection and day after injection, Disp: , Rfl:  .  traZODone (DESYREL) 100 MG tablet, TAKE 1 TO 2 TABLETS BY  MOUTH AT BEDTIME, Disp: 180 tablet, Rfl: 3 .  TURMERIC PO, Take by mouth., Disp: , Rfl:  .  HYDROcodone-acetaminophen (NORCO) 7.5-325 MG tablet, Take 1 tablet by mouth every 6 (six) hours as needed for moderate pain., Disp: 120 tablet, Rfl: 0 .  pregabalin (LYRICA) 75 MG capsule, Take 1 capsule (75 mg total) by mouth daily., Disp: 90 capsule, Rfl: 1  EXAM:   VITALS per patient if applicable: None reported  GENERAL: alert, oriented, appears well and in no acute distress  HEENT: atraumatic, conjunttiva clear, no obvious abnormalities on inspection of external nose and ears  NECK: normal movements of the head and neck  LUNGS: on inspection no signs of respiratory distress, breathing rate appears normal, no obvious gross increased work of breathing, gasping or wheezing  CV: no obvious cyanosis  MS: moves all visible extremities without noticeable abnormality  PSYCH/NEURO: pleasant and cooperative, no obvious depression or anxiety, speech and thought processing grossly intact  ASSESSMENT AND PLAN:   Spinal stenosis, lumbar region, with neurogenic claudication  -PDMP reviewed, overdose risk score is 350, she has 1 red flag for over 4 pharmacies in the past 2 years.  We have agreed that she will use the  [facility] [address] from now on forward. -I will refill her Norco 7.5/325 mg to take 1 tablet every 6 hours for total of 120 tablets a month x3 months.  Other chronic back pain  - Plan: pregabalin (LYRICA) 75 MG capsule  Advised that she may proceed with her COVID booster 2 weeks after diagnosis of COVID-19 illness.     I  discussed the assessment and treatment plan with the patient. The patient was provided an opportunity to ask questions and all were answered. The patient agreed with the plan and demonstrated an understanding of the instructions.   The patient was advised to call back or seek an in-person evaluation if the symptoms worsen or if the condition fails to improve as anticipated.    Lelon Frohlich, MD  [facility]

## 2020-06-30 ENCOUNTER — Other Ambulatory Visit: Payer: Self-pay | Admitting: Internal Medicine

## 2020-06-30 DIAGNOSIS — E039 Hypothyroidism, unspecified: Secondary | ICD-10-CM

## 2020-07-29 DIAGNOSIS — H43392 Other vitreous opacities, left eye: Secondary | ICD-10-CM | POA: Diagnosis not present

## 2020-07-29 DIAGNOSIS — H43813 Vitreous degeneration, bilateral: Secondary | ICD-10-CM | POA: Diagnosis not present

## 2020-07-29 DIAGNOSIS — H353211 Exudative age-related macular degeneration, right eye, with active choroidal neovascularization: Secondary | ICD-10-CM | POA: Diagnosis not present

## 2020-07-29 DIAGNOSIS — H353123 Nonexudative age-related macular degeneration, left eye, advanced atrophic without subfoveal involvement: Secondary | ICD-10-CM | POA: Diagnosis not present

## 2020-07-30 ENCOUNTER — Other Ambulatory Visit: Payer: Self-pay | Admitting: Internal Medicine

## 2020-08-08 ENCOUNTER — Telehealth: Payer: Self-pay | Admitting: Pharmacist

## 2020-08-08 NOTE — Chronic Care Management (AMB) (Signed)
    Chronic Care Management Pharmacy Assistant   Name: Natasa Stigall  MRN: [id number] DOB: [birth year]  08/08/20- called patient to remind of appointment with Gaylord Shih) on (08/11/20 at 4pm by phone)   No answer, left message of appointment date, time and type of appointment (either telephone or in person). Left message to have all medications, supplements, blood pressure and/or blood sugar logs available during appointment and to return call if need to reschedule.  Star Rating Drug:  Atorvastatin 40mg  - last filled on 06/05/20 90DS at [facility] Lisinopril 10mg  - last filled on 07/10/20 90DS at [facility] Metformin 1000mg  - last filled on 07/02/20 90DS at [facility] Pioglitazone 15mg  - last filled on 06/19/20 90DS at [facility]  Any gaps in medications fill history? No CMA  Clinical Pharmacist Assistant [phone]

## 2020-08-11 ENCOUNTER — Telehealth: Payer: Medicare Other

## 2020-08-22 ENCOUNTER — Telehealth: Payer: Self-pay | Admitting: Pharmacist

## 2020-08-22 NOTE — Progress Notes (Signed)
Date- Patient called to remind of appointment with Sheran Lawless on 07.05.2022 at 4:00 pm    Patient aware of appointment date, time, and type of appointment (telephone). Patient aware to have/bring all medications, supplements, blood pressure and/or blood sugar logs to visit.  Questions: Have you had any recent office visit or specialist visit outside of [facility]? No Are there any concerns you would like to discuss during your office visit? No Are you having any problems obtaining your medications? (Whether it pharmacy issues or cost) No  Star Rating Drug: Medication Dispensed Quantity Pharmacy  Atorvastatin 40 mg  04.14.2022 90 [facility]  Lisinopril 10 mg 05.19.2022 90 [facility]  Metformin 1000 mg 05.11.2022 180 [facility]    Any gaps in medications fill history? No   Berenice Bouton, [facility] Clinical Pharmacist Assistant [phone]

## 2020-08-26 ENCOUNTER — Encounter: Payer: Self-pay | Admitting: Internal Medicine

## 2020-08-26 ENCOUNTER — Telehealth: Payer: Medicare Other

## 2020-08-26 ENCOUNTER — Telehealth: Payer: Self-pay | Admitting: Pharmacist

## 2020-08-26 NOTE — Progress Notes (Deleted)
Chronic Care Management Pharmacy Note  08/26/2020 Name:  Waynetta Metheny MRN:  [id number] DOB:  08-15-1945  Summary: ***  Recommendations/Changes made from today's visit: ***  Plan: ***  Subjective: Irasema Chalk is an 75 y.o. year old female who is a primary patient of Isaac Bliss, Rayford Halsted, MD.  The CCM team was consulted for assistance with disease management and care coordination needs.    Engaged with patient face to face for initial visit in response to provider referral for pharmacy case management and/or care coordination services.   Consent to Services:  The patient was given the following information about Chronic Care Management services today, agreed to services, and gave verbal consent: 1. CCM service includes personalized support from designated clinical staff supervised by the primary care provider, including individualized plan of care and coordination with other care providers 2. 24/7 contact phone numbers for assistance for urgent and routine care needs. 3. Service will only be billed when office clinical staff spend 20 minutes or more in a month to coordinate care. 4. Only one practitioner may furnish and bill the service in a calendar month. 5.The patient may stop CCM services at any time (effective at the end of the month) by phone call to the office staff. 6. The patient will be responsible for cost sharing (co-pay) of up to 20% of the service fee (after annual deductible is met). Patient agreed to services and consent obtained.  Patient Care Team: Isaac Bliss, Rayford Halsted, MD as PCP - General (Internal Medicine) Viona Gilmore, [facility] as Pharmacist (Pharmacist)  Recent office visits: 06/24/20 Domingo Mend, MD: Patient presented for video visit for pain management. Refilled her Norco and Lyrica.   06/12/20 Colin Benton, DO: Patient presented for video visit with COVID infection.   03/26/20 Domingo Mend, MD: Patient presented for chronic conditions  follow up and pain management. Placed order for DEXA. Recommended for her to resume vitamin D tablets.  12/05/19 Domingo Mend, MD: Patient presented for annual exam. Prescribed vitamin D 50,000 units weekly. Stopped lisinopril and recommended repeat BMET. Decreased levothyroxine to 137 mcg daily. Influenza vaccine administered.  Recent consult visits: 03/27/20 Kara Mead, MD (pulmonary): Patient presented for OSA consult. Recommended nasal mask. Plan to set her up with desensitization session.  11/28/19 Sherlynn Stalls (ophthalmology): Patient presented for bevacizumab injection. Unable to access notes.   Hospital visits: None in previous 6 months  Objective:  Lab Results  Component Value Date   CREATININE 0.95 05/16/2020   BUN 25 (H) 05/16/2020   GFR 59.15 (L) 05/16/2020   GFRNONAA >60 12/28/2017   GFRAA >60 12/28/2017   NA 141 05/16/2020   K 4.1 05/16/2020   CALCIUM 9.8 05/16/2020   CO2 28 05/16/2020   GLUCOSE 98 05/16/2020    Lab Results  Component Value Date/Time   HGBA1C 6.0 (A) 12/05/2019 08:37 AM   HGBA1C 6.2 08/15/2018 09:25 AM   HGBA1C 6.2 01/13/2018 12:28 PM   GFR 59.15 (L) 05/16/2020 10:07 AM   GFR 62.27 08/15/2018 09:25 AM   MICROALBUR <0.7 04/19/2016 08:42 AM   MICROALBUR 1.3 01/19/2016 01:43 PM    Last diabetic Eye exam:  Lab Results  Component Value Date/Time   HMDIABEYEEXA No Retinopathy 12/14/2017 12:00 AM    Last diabetic Foot exam: No results found for: HMDIABFOOTEX   Lab Results  Component Value Date   CHOL 157 12/05/2019   HDL 62 12/05/2019   LDLCALC 68 12/05/2019   LDLDIRECT 70.0 08/15/2018   TRIG  203 (H) 12/05/2019   CHOLHDL 2.5 12/05/2019    Hepatic Function Latest Ref Rng & Units 12/05/2019 08/15/2018 04/19/2016  Total Protein 6.1 - 8.1 g/dL 6.3 6.3 6.6  Albumin 3.5 - 5.2 g/dL - 4.3 4.2  AST 10 - 35 U/L _0 ALT 6 - 29 U/L _1 Alk Phosphatase 39 - 117 U/L - 69 66  Total Bilirubin 0.2 - 1.2 mg/dL 0.3 0.3 0.3  Bilirubin,  Direct 0.0 - 0.3 mg/dL - - 0.1    Lab Results  Component Value Date/Time   TSH 0.90 05/16/2020 10:07 AM   TSH 0.23 (L) 12/05/2019 09:41 AM    CBC Latest Ref Rng & Units 12/05/2019 08/15/2018 12/28/2017  WBC 3.8 - 10.8 Thousand/uL 7.4 10.5 7.3  Hemoglobin 11.7 - 15.5 g/dL 11.8 12.4 10.6(L)  Hematocrit 35.0 - 45.0 % 35.8 36.8 33.7(L)  Platelets 140 - 400 Thousand/uL 189 202.0 151    Lab Results  Component Value Date/Time   VD25OH 39 12/05/2019 09:41 AM   VD25OH 63.02 08/15/2018 09:25 AM    Clinical ASCVD: No  The 10-year ASCVD risk score Mikey Bussing DC Jr., et al., 2013) is: 29.3%   Values used to calculate the score:     Age: 7 years     Sex: Female     Is Non-Hispanic African American: No     Diabetic: Yes     Tobacco smoker: No     Systolic Blood Pressure: 950 mmHg     Is BP treated: Yes     HDL Cholesterol: 62 mg/dL     Total Cholesterol: 157 mg/dL    Depression screen [facility] 2/9 03/26/2020 12/05/2019 08/15/2018  Decreased Interest 1 1 0  Down, Depressed, Hopeless _2 PHQ - 2 Score _3 Altered sleeping _4 Tired, decreased energy _5 Change in appetite 0 3 0  Feeling bad or failure about yourself  0 1 0  Trouble concentrating 2 3 0  Moving slowly or fidgety/restless 0 0 0  Suicidal thoughts - 0 0  PHQ-9 Score _6 Difficult doing work/chores Not difficult at all Very difficult -  Some recent data might be hidden      Social History   Tobacco Use  Smoking Status Former   Packs/day: 1.00   Years: 20.00   Pack years: 20.00   Types: Cigarettes   Quit date: 02/23/2004   Years since quitting: 16.5  Smokeless Tobacco Never  Tobacco Comments   quit 2007   BP Readings from Last 3 Encounters:  03/27/20 120/80  12/05/19 118/80  08/15/18 110/68   Pulse Readings from Last 3 Encounters:  03/27/20 67  12/05/19 78  08/15/18 83   Wt Readings from Last 3 Encounters:  06/24/20 193 lb (87.5 kg)  03/27/20 196 lb 3.2 oz (89 kg)  03/26/20 191 lb (86.6 kg)    BMI Readings from Last 3 Encounters:  06/24/20 36.47 kg/m  03/27/20 37.07 kg/m  03/26/20 35.80 kg/m    Assessment/Interventions: Review of patient past medical history, allergies, medications, health status, including review of consultants reports, laboratory and other test data, was performed as part of comprehensive evaluation and provision of chronic care management services.   SDOH:  (Social Determinants of Health) assessments and interventions performed: Yes   Patient lives alone in a small condo with a cat, who is 54 years old. She has arthritis and has lots of back pain  as well as she has had two fusions in her lumbar area and has metal in neck. She last saw her neurosurgeon a year and a half ago and realizes she needs to make a follow up appointment.   She is still driving without any issues. She currently has one daughter Elmyra Ricks) who is an Therapist, sports in labor and delivery at [facility] and she has 2 adults sons of her own Merrilee Seashore in [place] and [place] in [place]). She used to live in [place] and moved down to live closer to family. Her son lives in [place] and she hasn't been down in about 8 years to see him. She has lost contact with her brother due to a difference in opinion with politics and is upset by that.  She doesn't have much of a social life and reports it has been hard to get back in things with pandemic. She has a best friend in [place], whom she reconnected with from high school in the last 3 years, and a friend across the parking lot, whom she goes out to eat with every week. She is also pretty friendly with her neighbors.  She does have trouble with cooking and cleaning because she can't stand for very long. She doesn't eat out often and doesn't eat fast food. She tries to stay away from frozen meals but is not always successful and is not following a diabetic diet.  Patient is not walking as much due to her legs hurting more. She also has muscle pain in her arms and has a  neighbor's friend offered to clean her house for free but she feels she will have to clean prior to her coming over.  One of her biggest issues right now is with trouble falling asleep. She takes a pain pill and muscle relaxer before bed and sometimes takes the Xanax when she cannot go to sleep, even though she is aware not to. She sleeps maybe 4 hours a night and has a noisy neighbor who goes to bed at 1-2am. When she wakes up, she is awake for hours.  Patient denies any problems with her medications that she is aware of but does have concerns about going without pain medicine as she has had to wait for refills from the office and has been without pain medicine for 2.5 days.  CCM Care Plan  Allergies  Allergen Reactions   Duraprep [Antiseptic Products, Misc.] Itching and Rash    Irritation everywhere prep was used   Advil Allergy Sinus [Chlorpheniramine-Pse-Ibuprofen]     Nervousness    Betadine [Povidone Iodine] Other (See Comments)    Burning sensation.    Hydromorphone Hcl Other (See Comments)    Makes crazy   Morphine And Related Other (See Comments)    hallucinations    Medications Reviewed Today     Reviewed by Isaac Bliss, Rayford Halsted, MD (Physician) on 06/24/20 at [place] List Status: <None>   Medication Order Taking? Sig Documenting Provider Last Dose Status Informant  ALPRAZolam (XANAX) 0.5 MG tablet [id number] Yes Take 1 tablet (0.5 mg total) by mouth 2 (two) times daily as needed. for anxiety Isaac Bliss, Rayford Halsted, MD Taking Active   atenolol (TENORMIN) 25 MG tablet [id number] Yes TAKE 1 TABLET BY MOUTH  DAILY Isaac Bliss, Rayford Halsted, MD Taking Active   atorvastatin (LIPITOR) 40 MG tablet [id number] Yes TAKE 1 TABLET BY MOUTH  DAILY Isaac Bliss, Rayford Halsted, MD Taking Active   Calcium Carb-Cholecalciferol (CALCIUM 600/VITAMIN D3 PO) [id number] Yes  Take 1 tablet by mouth 2 (two) times daily. [provider] Taking Active Self  Carboxymethylcellulose  Sodium (LUBRICANT EYE DROPS OP) [id number] Yes Apply 2 drops to eye 4 (four) times daily as needed (dry eyes).  [provider] Taking Active Self  citalopram (CELEXA) 40 MG tablet [id number] Yes TAKE 1 TABLET BY MOUTH  DAILY Isaac Bliss, Rayford Halsted, MD Taking Active   docusate sodium (COLACE) 100 MG capsule [id number] Yes Take 200 mg by mouth at bedtime.  [provider] Taking Active Self  ergocalciferol (VITAMIN D2) 1.25 MG (50000 UT) capsule [id number] Yes Take 50,000 Units by mouth once a week. [provider] Taking Active   estradiol (ESTRACE) 0.1 MG/GM vaginal cream [id number] Yes PLACE 1 APPLICATORFUL  VAGINALLY 3 TIMES A WEEK. Isaac Bliss, Rayford Halsted, MD Taking Active   gemfibrozil (LOPID) 600 MG tablet [id number] Yes TAKE 1 TABLET BY MOUTH  TWICE DAILY BEFORE MEALS Isaac Bliss, Rayford Halsted, MD Taking Active   HYDROcodone-acetaminophen [facility]) 7.5-325 MG tablet [id number]  Take 1 tablet by mouth every 6 (six) hours as needed for moderate pain. Isaac Bliss, Rayford Halsted, MD  Active   HYDROcodone-acetaminophen [facility]) 7.5-325 MG tablet [id number] Yes Take 1 tablet by mouth every 6 (six) hours as needed for moderate pain. Isaac Bliss, Rayford Halsted, MD  Active   HYDROcodone-acetaminophen [facility]) 7.5-325 MG tablet [id number] Yes Take 1 tablet by mouth every 6 (six) hours as needed for moderate pain. Isaac Bliss, Rayford Halsted, MD  Active   hydrocortisone 2.5 % cream [id number] Yes APPLY TO AFFECTED AREA(S)  TOPICALLY TWICE DAILY Isaac Bliss, Rayford Halsted, MD Taking Active   Lancets [facility] ULTRASOFT) lancets [id number] Yes Use daily Marletta Lor, MD Taking Active Self  levothyroxine (SYNTHROID) 137 MCG tablet [id number] Yes Take 1 tablet (137 mcg total) by mouth daily. Isaac Bliss, Rayford Halsted, MD Taking Active   lisinopril (ZESTRIL) 10 MG tablet [id number] Yes TAKE 1 TABLET BY MOUTH  DAILY Isaac Bliss, Rayford Halsted, MD Taking Active   loratadine (CLARITIN) 10 MG  tablet [id number] Yes Take 10 mg by mouth daily as needed for allergies. [provider] Taking Active   meloxicam (MOBIC) 15 MG tablet [id number] Yes TAKE 1 TABLET BY MOUTH  DAILY Isaac Bliss, Rayford Halsted, MD Taking Active   metFORMIN (GLUCOPHAGE) 1000 MG tablet [id number] Yes TAKE 1 TABLET BY MOUTH TWO  TIMES DAILY WITH MEALS Isaac Bliss, Rayford Halsted, MD Taking Active   Misc. Devices (BARIATRIC ROLLATOR) MISC [id number] Yes 1 Rollator with basket. Isaac Bliss, Rayford Halsted, MD Taking Active   Multiple Vitamin (MULTIVITAMIN) tablet [id number] Yes Take 1 tablet by mouth daily. [provider] Taking Active Self  Omega-3 Fatty Acids (FISH OIL) 1200 MG CAPS [id number] Yes Take 1 capsule by mouth daily.  [provider] Taking Active Self  ONE TOUCH ULTRA TEST test strip [id number] Yes USE 1 DAILY AS DIRECTED Marletta Lor, MD Taking Active Self  pantoprazole (PROTONIX) 40 MG tablet [id number] Yes TAKE 1 TABLET BY MOUTH  TWICE DAILY BEFORE MEALS Isaac Bliss, Rayford Halsted, MD Taking Active   pioglitazone (ACTOS) 15 MG tablet [id number] Yes TAKE 1 TABLET BY MOUTH  DAILY Isaac Bliss, Rayford Halsted, MD Taking Active   pregabalin (LYRICA) 75 MG capsule [id number]  Take 1 capsule (75 mg total) by mouth daily. Isaac Bliss, Rayford Halsted, MD  Active   rOPINIRole (REQUIP) 1 MG tablet [id number] Yes TAKE 1 TABLET BY MOUTH AT  BEDTIME Isaac Bliss, Rayford Halsted, MD Taking  Active   tiZANidine (ZANAFLEX) 4 MG tablet [id number] Yes Take 1 tablet (4 mg total) by mouth 3 (three) times daily as needed. Isaac Bliss, Rayford Halsted, MD Taking Active   tobramycin (TOBREX) 0.3 % ophthalmic solution [id number] Yes Place 1 drop into both eyes every 6 (six) hours. Day before injection, day of injection and day after injection [provider] Taking Active   traZODone (DESYREL) 100 MG tablet [id number] Yes TAKE 1 TO 2 TABLETS BY  MOUTH AT BEDTIME Isaac Bliss, Rayford Halsted, MD Taking Active    TURMERIC PO [id number] Yes Take by mouth. [provider] Taking Active             Patient Active Problem List   Diagnosis Date Noted   Morbid obesity ([place]) 01/13/2018   S/P knee replacement 12/27/2017   Spondylolisthesis at L1-L2 level 03/18/2015   Spondylosis with myelopathy 09/02/2014   Cervical spondylosis with myelopathy 09/02/2014   Anxiety 12/21/2012   Chronic back pain 12/21/2012   Insomnia 12/21/2012   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, L2-5 12/15/2012   Spinal stenosis, lumbar region, with neurogenic claudication 10/26/2012   Spinal stenosis, thoracic 62/95/2841   Umbilical hernia 32/44/0102   OSA (obstructive sleep apnea) 11/12/2011   Exogenous obesity 11/12/2011   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   Hypothyroidism 09/18/2009   Diabetes mellitus without complication ([place]) 72/53/6644   Dyslipidemia 09/18/2009   Anemia 09/18/2009   Depression 09/18/2009   Essential hypertension 09/18/2009   GERD 09/18/2009   Primary localized osteoarthritis of left knee 09/18/2009    Immunization History  Administered Date(s) Administered   Fluad Quad(high Dose 65+) 12/05/2019   Influenza Split 01/28/2011, 11/12/2011   Influenza Whole 11/20/2009   Influenza, High Dose Seasonal PF 12/27/2013, 12/25/2014, 01/19/2016, 12/01/2016, 01/13/2018, 10/26/2018   Influenza,inj,Quad PF,6+ Mos 12/18/2012   PFIZER Comirnaty(Gray Top)Covid-19 Tri-Sucrose Vaccine 06/30/2020   PFIZER(Purple Top)SARS-COV-2 Vaccination 03/16/2019, 04/03/2019, 11/21/2019   Pneumococcal Conjugate-13 12/27/2013   Pneumococcal Polysaccharide-23 11/12/2011   Tdap 07/17/2013   Zoster Recombinat (Shingrix) 11/08/2017, 03/15/2018    Conditions to be addressed/monitored:  Hypertension, Hyperlipidemia, Diabetes, GERD, Hypothyroidism, Depression, Anxiety, Allergic Rhinitis and OSA, pain  There are no care plans that you recently modified to display for this patient.   Patient Care Plan: CCM  Pharmacy Care Plan     Problem Identified: Problem: Hypertension, Hyperlipidemia, Diabetes, GERD, Hypothyroidism, Depression, Anxiety, Allergic Rhinitis and OSA, pain      Long-Range Goal: Patient-Specific Goal   Start Date: 05/12/2020  Expected End Date: 05/12/2021  This Visit's Progress: On track  Priority: High  Note:   Current Barriers:  Unable to independently monitor therapeutic efficacy Unable to achieve control of cholesterol  Suboptimal therapeutic regimen for cholesterol  Pharmacist Clinical Goal(s):  Patient will achieve adherence to monitoring guidelines and medication adherence to achieve therapeutic efficacy achieve control of cholesterol as evidenced by next cholesterol check  through collaboration with PharmD and provider.   Interventions: 1:1 collaboration with Isaac Bliss, Rayford Halsted, MD regarding development and update of comprehensive plan of care as evidenced by provider attestation and co-signature Inter-disciplinary care team collaboration (see longitudinal plan of care) Comprehensive medication review performed; medication list updated in electronic medical record  Hypertension (BP goal <140/90) -Controlled -Current treatment: Atenolol 25 mg 1 tablet daily - in AM Lisinopril 10 mg 1 tablet daily - in AM (has not restarted) -Medications previously tried: none -Current home readings: does not regularly check at home -Current dietary habits: does not eat as well as she should -Current  exercise habits: no regular exercise -Denies hypotensive/hypertensive symptoms -Educated on Exercise goal of 150 minutes per week; Importance of home blood pressure monitoring; Proper BP monitoring technique; -Counseled to monitor BP at home weekly, document, and provide log at future appointments -Recommended to continue current medication Scheduled appointment with lab to recheck kidney function to determine if she can restart lisinopril  Hyperlipidemia: (LDL goal <  100) -Controlled -Current treatment: Atorvastatin 40 mg 1 tablet daily Gemfibrozil 600 mg 1 tablet twice daily before meals Fish oil 1200 mg 1 capsule daily  -Medications previously tried: none  -Current dietary patterns: does not eat many vegetables -Current exercise habits: no structured exercise -Educated on Cholesterol goals;  Importance of limiting foods high in cholesterol; Exercise goal of 150 minutes per week; -Recommended switching gemfibrozil and fish oil to Vascepa  Diabetes (A1c goal <7%) -Controlled -Current medications: Metformin 1000 mg 1 tablet twice daily with meals Pioglitazone 15 mg 1 tablet daily -Medications previously tried: glipizide (lows)  -Current home glucose readings: doesn't check blood sugar regularly fasting glucose: n/a post prandial glucose: n/a -Denies hypoglycemic/hyperglycemic symptoms -Current meal patterns:  breakfast: apple slices and peanut butter, 2 cup of coffee with half an half lunch: n/a dinner: chicken salad with bread or butter lettuce snacks: candy, potato chips, caramel corn drinks: caffeine free diet pepsi, no sugary drinks -Current exercise: no structured exercise -Educated on A1c and blood sugar goals; Exercise goal of 150 minutes per week; Benefits of routine self-monitoring of blood sugar; Carbohydrate counting and/or plate method -Counseled to check feet daily and get yearly eye exams -Recommended to continue current medication Provided healthy plate handout  Depression/Anxiety (Goal: minimize symptoms) -Uncontrolled -Current treatment: Citalopram 40 mg 1 tablet daily Alprazolam 0.5 mg 1 tablet twice daily as needed -Medications previously tried/failed: Wellbutrin & Effexor (split personalities), Prozac, Lexapro, Zoloft, amitriptyline -PHQ9: 10 -GAD7: n/a -Educated on Benefits of medication for symptom control Benefits of cognitive-behavioral therapy with or without medication -Recommended to continue current  medication Reminded patient to call behavioral health to set up appointment  Insomnia (Goal: improve quality and quantity of sleep) -Uncontrolled -Current treatment  Trazodone 100 mg 1-2 tablets at bedtime as needed -Medications previously tried: Ambien  -Counseled on practicing good sleep hygiene by setting a sleep schedule and maintaining it, avoid excessive napping, following a nightly routine, avoiding screen time for 30-60 minutes before going to bed, and making the bedroom a cool, quiet and dark space  Pain (Goal: minimize symptoms) -Uncontrolled -Current treatment  Pregabalin 75 mg 1 capsule daily (more puts her to sleep) Hydrocodone-APAP 7.5-325 1 tablet every 6 hours as needed Tizanidine 4 mg tablet 3 times a day (mostly taking 2 as needed) Meloxicam 15 mg 1 tablet daily -Medications previously tried: n/a -Recommended to continue current medication Counseled on avoidance of taking Hydrocodone, Tizanidine and Alprazolam at the same time  GERD (Goal: minimize symptoms) -Controlled -Current treatment  Pantoprazole 40 mg 1 tablet twice daily before meals as needed -Medications previously tried: n/a -Counseled on non-pharmacologic management of symptoms such as elevating the head of your bed, avoiding eating 2-3 hours before bed, avoiding triggering foods such as acidic, spicy, or fatty foods, eating smaller meals, and wearing clothes that are loose around the waist  Vitamin D deficiency (Goal: vitamin D 30-100) -Uncontrolled -Current treatment  Ergocalciferol 50,000 units 1 tablet at bedtime -Medications previously tried: n/a  -Recommended to continue current medication Counseled on taking with food to help absorption Educated on waiting for 12 weeks to recheck  lab work  Allergic rhinitis (Goal: minimize symptoms) -Controlled -Current treatment  Loratadine 10 mg 1 tablet daily as needed -Medications previously tried: Advil (anxious)  -Recommended to continue current  medication  Hypothyroidism (Goal: TSH 0.35-4.5) -Uncontrolled -Current treatment  Levothyroxine 137 mcg 1 tablet daily -Medications previously tried: none  -Counseled on taking on an empty stomach prior to all other food and medications  Restless legs syndrome (Goal: minimize symptoms) -Controlled -Current treatment  Ropinirole 1 mg 1 tablet at bedtime -Medications previously tried: n/a  -Recommended to continue current medication Counseled on avoidance of caffeine to minimize symptoms  Osteopenia (Goal improve bone density and prevent fractures) -Uncontrolled -Last DEXA Scan: 05/09/20   T-Score femoral neck: -2.0, -2.4  T-Score total hip: n/a  T-Score lumbar spine: n/a  T-Score forearm radius: -1.5  10-year probability of major osteoporotic fracture: 14.3%  10-year probability of hip fracture: 4.0% -Patient is a candidate for pharmacologic treatment due to T-Score -1.0 to -2.5 and 10-year risk of hip fracture > 3% -Current treatment  Calcium-vitamin D daily -Medications previously tried: none -Recommend [phone] units of vitamin D daily. Recommend 1200 mg of calcium daily from dietary and supplemental sources. Recommend weight-bearing and muscle strengthening exercises for building and maintaining bone density. -Counseled on switching calcium carbonate to calcium citrate to improve absorption   Health Maintenance -Vaccine gaps: none -Current therapy:  Hydrocortisone 2.5% cream Multivitamin 1 tablet daily Docusate 100 mg 2 capsules at bedtime Preservision Areds 2 1 capsule twice daily Lubricant ([place]) Estradiol cream (prolapse) -Educated on Cost vs benefit of each product must be carefully weighed by individual consumer -Patient is satisfied with current therapy and denies issues -Recommended to continue current medication  Patient Goals/Self-Care Activities Patient will:  - take medications as prescribed check glucose a few times a week, document, and provide at  future appointments check blood pressure weekly, document, and provide at future appointments target a minimum of 150 minutes of moderate intensity exercise weekly  Follow Up Plan: Telephone follow up appointment with care management team member scheduled for:  3 months    D/c gemfibrozil - ?  Checking BP?  Taper trazodone?  Switch off xanax?  Pain management referral?  1. should she be taking a baby aspirin? 2. what the reason was why she could not take Benadryl 3. if she discontinues Xanax, what other medication can she go on 4. Is there help with the [facility] for name-brand fish oil?  Medication Assistance: None required.  Patient affirms current coverage meets needs.  Compliance/Adherence/Medication fill history: Care Gaps: Foot exam, eye exam, A1c  Star-Rating Drugs: Atorvastatin 40 mg - last filled 06/05/20 for 90 ds at [facility] Lisinopril 10 mg - last filled 07/10/20 for 90 ds at [facility] Metformin 1000 mg - last filled 07/02/20 for 90 ds at [facility]  Patient's preferred pharmacy is:  [facility]  ([place], [address] [place] [address] Phone: [phone] Fax: [phone]  [id number] [address]: [phone]: [phone] Uses pill box? No - n/a Pt endorses % compliance - need to readdress   We discussed: Current pharmacy is preferred with insurance plan and patient is satisfied with pharmacy services Patient decided to: Continue current medication management strategy  Care Plan and Follow Up Patient Decision:  Patient agrees to Care Plan and Follow-up.  Plan: Telephone follow  up appointment with care management team member scheduled for:  3 months  Jeni Salles, PharmD [place] [facility]ist [facility] at [place] [phone]

## 2020-08-26 NOTE — Telephone Encounter (Signed)
  Chronic Care Management   Outreach Note  08/26/2020 Name: Chana Lindstrom MRN: [id number] DOB: Nov 15, 1945  Referred by: Philip Aspen, Limmie Patricia, MD  Patient had a phone appointment scheduled with clinical pharmacist today.  An unsuccessful telephone outreach was attempted today. The patient was referred to the pharmacist for assistance with care management and care coordination.   If possible, a message was left to return call to: [phone] or to [facility]: [phone]   Gaylord Shih, PharmD, [facility] Clinical Pharmacist  Healthcare at [place] [phone]

## 2020-08-27 ENCOUNTER — Other Ambulatory Visit: Payer: Self-pay | Admitting: Internal Medicine

## 2020-08-27 DIAGNOSIS — F419 Anxiety disorder, unspecified: Secondary | ICD-10-CM

## 2020-08-27 MED ORDER — ALPRAZOLAM 0.5 MG PO TABS: 0.5000 mg | ORAL_TABLET | Freq: Two times a day (BID) | ORAL | 2 refills | Status: DC | PRN

## 2020-09-06 ENCOUNTER — Other Ambulatory Visit: Payer: Self-pay | Admitting: Internal Medicine

## 2020-09-08 ENCOUNTER — Other Ambulatory Visit: Payer: Self-pay | Admitting: Internal Medicine

## 2020-09-08 DIAGNOSIS — M48062 Spinal stenosis, lumbar region with neurogenic claudication: Secondary | ICD-10-CM

## 2020-09-19 ENCOUNTER — Telehealth (INDEPENDENT_AMBULATORY_CARE_PROVIDER_SITE_OTHER): Payer: Medicare Other | Admitting: Internal Medicine

## 2020-09-19 ENCOUNTER — Encounter: Payer: Self-pay | Admitting: Internal Medicine

## 2020-09-19 VITALS — Wt 186.0 lb

## 2020-09-19 DIAGNOSIS — M545 Low back pain, unspecified: Secondary | ICD-10-CM

## 2020-09-19 DIAGNOSIS — M48062 Spinal stenosis, lumbar region with neurogenic claudication: Secondary | ICD-10-CM

## 2020-09-19 DIAGNOSIS — G8929 Other chronic pain: Secondary | ICD-10-CM

## 2020-09-19 MED ORDER — HYDROCODONE-ACETAMINOPHEN 7.5-325 MG PO TABS: 1.0000 | ORAL_TABLET | Freq: Four times a day (QID) | ORAL | 0 refills | Status: DC | PRN

## 2020-09-19 NOTE — Progress Notes (Signed)
Virtual Visit via Video Note  I connected with Katie Booker on 09/19/20 at  1:30 PM EDT by a video enabled telemedicine application and verified that I am speaking with the correct person using two identifiers.  Location patient: home Location provider: work office Persons participating in the virtual visit: patient, provider  I discussed the limitations of evaluation and management by telemedicine and the availability of in person appointments. The patient expressed understanding and agreed to proceed.   HPI: She has scheduled this visit for controlled substance prescription per pain contract.She has a history of chronic back pain due to spinal stenosis and spondylolisthesis.  She is on Norco 7.5/325 mg that she takes every 6 hours.  She has been tolerating medications well.  She has no acute complaints today.  She got a new cat named Jacquelin Hawking.   ROS: Constitutional: Denies fever, chills, diaphoresis, appetite change.  HEENT: Denies photophobia, eye pain, redness, hearing loss, ear pain, congestion, sore throat, rhinorrhea, sneezing, mouth sores, trouble swallowing, neck pain, neck stiffness and tinnitus.   Respiratory: Denies SOB, DOE, cough, chest tightness,  and wheezing.   Cardiovascular: Denies chest pain, palpitations and leg swelling.  Gastrointestinal: Denies nausea, vomiting, abdominal pain, diarrhea, constipation, blood in stool and abdominal distention.  Genitourinary: Denies dysuria, urgency, frequency, hematuria, flank pain and difficulty urinating.  Endocrine: Denies: hot or cold intolerance, sweats, changes in hair or nails, polyuria, polydipsia. Musculoskeletal: Denies myalgias, back pain, joint swelling, arthralgias and gait problem.  Skin: Denies pallor, rash and wound.  Neurological: Denies dizziness, seizures, syncope, weakness, light-headedness, numbness and headaches.  Hematological: Denies adenopathy. 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   Depression    DOE (dyspnea on exertion)    GERD    Headache    Hemorrhoids    History of basal cell carcinoma excision    left cheek   History of colon polyps    History of hyperthyroidism 2011   RAI treatement   History of panic attacks    Hyperlipidemia    Hypertension    Hypothyroidism, postradioiodine therapy    followed by pcp   IDA (iron deficiency anemia)    intermittant   Insomnia    OA (osteoarthritis)    OSA (obstructive sleep apnea)    no cpap, per pt tried unable to tolerate   Peripheral neuropathy    legs and feet from hx back surgery's   Rash    bilateral axilla area -- per pt due to some personal wipes used other the counter   RLS (restless legs syndrome)    Spondylolisthesis at L1-L2 level    Tingling of both upper extremities    left greater than right due to cervical pinched nerve   Type 2 diabetes mellitus (HCC)    followed by pcp   Umbilical hernia    Urinary frequency    Urinary urgency     Past Surgical History:  Procedure Laterality Date   ANTERIOR CERVICAL DECOMP/DISCECTOMY FUSION N/A 09/02/2014   Procedure: Cervical  five- six  Anterior cervical decompression fusion;  Surgeon: Barnett AbuHenry Elsner, MD;  Location: [facility];  Service: Neurosurgery;  Laterality: N/A;  C5-6 Anterior cervical decompression/diskectomy/fusion   BREAST BIOPSY Right 10/2018   x 2 benign   CARPAL TUNNEL RELEASE Right 2016   CATARACT EXTRACTION W/ INTRAOCULAR LENS  IMPLANT, BILATERAL  03/2015   COLONOSCOPY  2007   ESOPHAGOGASTRODUODENOSCOPY  2007    HEMORRHOID SURGERY  03/23/2011   Procedure: HEMORRHOIDECTOMY;  Surgeon: Clovis Puhomas A. Cornett, MD;  Location: [facility];  Service: General;  Laterality: N/A;  lateral internal sphincterotomy and hemorrhoidectomy   LUMBAR FUSION  02/2015   L1 -- 2   PARTIAL KNEE ARTHROPLASTY Left 12/27/2017   Procedure: UNICOMPARTMENTAL LEFT KNEE;  Surgeon: Teryl LucyLandau, Joshua, MD;  Location: [facility];  Service: Orthopedics;  Laterality: Left;   THORACIC DISCECTOMY N/A 12/15/2012   Procedure: Thoracic ten-eleven Thoracic laminectomy;  Surgeon: Barnett AbuHenry Elsner, MD;  Location: [facility];  Service: Neurosurgery;  Laterality: N/A;  Thoracic ten-eleven Thoracic laminectomy   TUBAL LIGATION Bilateral 1991    Family History  Problem Relation Age of Onset   Diabetes Father    COPD Father    Cancer Maternal Grandfather        stomach    SOCIAL HX:   reports that she quit smoking about 16 years ago. Her smoking use included cigarettes. She has a 20.00 pack-year smoking history. She has never used smokeless tobacco. She reports current alcohol use. She reports that she does not use drugs.   Current Outpatient Medications:    ALPRAZolam (XANAX) 0.5 MG tablet, Take 1 tablet (0.5 mg total) by mouth 2 (two) times daily as needed. for anxiety, Disp: 60 tablet, Rfl: 2   atenolol (TENORMIN) 25 MG tablet, TAKE 1 TABLET BY MOUTH  DAILY, Disp: 90 tablet, Rfl: 3   atorvastatin (LIPITOR) 40 MG tablet, TAKE 1 TABLET BY MOUTH  DAILY, Disp: 90 tablet, Rfl: 1   Calcium Carb-Cholecalciferol (CALCIUM 600/VITAMIN D3 PO), Take 1 tablet by mouth 2 (two) times daily., Disp: , Rfl:    Carboxymethylcellulose Sodium (LUBRICANT EYE DROPS OP), Apply 2 drops to eye 4 (four) times daily as needed (dry eyes). , Disp: , Rfl:    citalopram (CELEXA) 40 MG tablet, TAKE 1 TABLET BY MOUTH  DAILY, Disp: 90 tablet, Rfl: 3   docusate sodium (COLACE) 100 MG capsule, Take 200 mg by mouth at bedtime. , Disp: , Rfl:    estradiol (ESTRACE) 0.1 MG/GM  vaginal cream, PLACE 1 APPLICATORFUL  VAGINALLY 3 TIMES A WEEK., Disp: 170 g, Rfl: 3   gemfibrozil (LOPID) 600 MG tablet, TAKE 1 TABLET BY MOUTH  TWICE DAILY BEFORE MEALS, Disp: 180 tablet, Rfl: 3   hydrocortisone 2.5 % cream, APPLY TO AFFECTED AREA(S)  TOPICALLY TWICE DAILY, Disp: 85.05 g, Rfl: 1   Lancets (ONETOUCH ULTRASOFT) lancets, Use daily, Disp: 100 each, Rfl: 6   levothyroxine (SYNTHROID) 137 MCG tablet, TAKE 1 TABLET (137 MCG TOTAL) BY MOUTH DAILY., Disp: 90 tablet, Rfl: 1   lisinopril (ZESTRIL) 10 MG tablet, TAKE 1 TABLET BY MOUTH  DAILY, Disp: 90 tablet, Rfl: 3   loratadine (CLARITIN) 10 MG tablet, Take 10 mg by mouth daily as needed for allergies., Disp: , Rfl:    meloxicam (MOBIC) 15 MG tablet, TAKE 1 TABLET BY MOUTH  DAILY, Disp: 90 tablet, Rfl: 3   metFORMIN (GLUCOPHAGE) 1000 MG tablet, TAKE 1 TABLET BY MOUTH TWO  TIMES DAILY WITH MEALS, Disp: 180 tablet, Rfl:  3   Misc Natural Products (TURMERIC CURCUMIN) CAPS, Take by mouth., Disp: , Rfl:    Misc. Devices (BARIATRIC ROLLATOR) MISC, 1 Rollator with basket., Disp: 1 each, Rfl: 0   Multiple Vitamin (MULTIVITAMIN) tablet, Take 1 tablet by mouth daily., Disp: , Rfl:    Omega-3 Fatty Acids (FISH OIL) 1200 MG CAPS, Take 1 capsule by mouth daily. , Disp: , Rfl:    ONE TOUCH ULTRA TEST test strip, USE 1 DAILY AS DIRECTED, Disp: 100 each, Rfl: 3   pantoprazole (PROTONIX) 40 MG tablet, TAKE 1 TABLET BY MOUTH  TWICE DAILY BEFORE MEALS, Disp: 180 tablet, Rfl: 1   pioglitazone (ACTOS) 15 MG tablet, TAKE 1 TABLET BY MOUTH  DAILY, Disp: 90 tablet, Rfl: 3   pregabalin (LYRICA) 75 MG capsule, Take 1 capsule (75 mg total) by mouth daily., Disp: 90 capsule, Rfl: 1   rOPINIRole (REQUIP) 1 MG tablet, TAKE 1 TABLET BY MOUTH AT  BEDTIME, Disp: 90 tablet, Rfl: 3   tiZANidine (ZANAFLEX) 4 MG tablet, TAKE 1 TABLET BY MOUTH 3  TIMES DAILY AS NEEDED, Disp: 180 tablet, Rfl: 1   tobramycin (TOBREX) 0.3 % ophthalmic solution, Place 1 drop into both eyes every 6  (six) hours. Day before injection, day of injection and day after injection, Disp: , Rfl:    traZODone (DESYREL) 100 MG tablet, TAKE 1 TO 2 TABLETS BY  MOUTH AT BEDTIME, Disp: 180 tablet, Rfl: 3   TURMERIC PO, Take by mouth., Disp: , Rfl:    Vitamin D, Ergocalciferol, (DRISDOL) 1.25 MG (50000 UNIT) CAPS capsule, TAKE 1 CAPSULE BY MOUTH  EVERY 7 DAYS FOR 12 DOSES, Disp: 12 capsule, Rfl: 3   HYDROcodone-acetaminophen (NORCO) 7.5-325 MG tablet, Take 1 tablet by mouth every 6 (six) hours as needed for moderate pain., Disp: 120 tablet, Rfl: 0   HYDROcodone-acetaminophen (NORCO) 7.5-325 MG tablet, Take 1 tablet by mouth every 6 (six) hours as needed for moderate pain., Disp: 120 tablet, Rfl: 0   HYDROcodone-acetaminophen (NORCO) 7.5-325 MG tablet, Take 1 tablet by mouth every 6 (six) hours as needed for moderate pain., Disp: 120 tablet, Rfl: 0  EXAM:   VITALS per patient if applicable:   GENERAL: alert, oriented, appears well and in no acute distress  HEENT: atraumatic, conjunttiva clear, no obvious abnormalities on inspection of external nose and ears  NECK: normal movements of the head and neck  LUNGS: on inspection no signs of respiratory distress, breathing rate appears normal, no obvious gross increased work of breathing, gasping or wheezing  CV: no obvious cyanosis  MS: moves all visible extremities without noticeable abnormality  PSYCH/NEURO: pleasant and cooperative, no obvious depression or anxiety, speech and thought processing grossly intact  ASSESSMENT AND PLAN:   Chronic midline low back pain without sciatica Spinal stenosis, lumbar region, with neurogenic claudication   -PDMP has been reviewed, overdose risk score is 310, she has 1 red flag for over 4 dispensing pharmacies in the past 2 years.  All prescriptions have been by me recently. -I will go ahead and refill her chronic dose of hydrocodone which is 7.5 mg to take every 6 hours for total of 120 tablets a month x3  months.    I discussed the assessment and treatment plan with the patient. The patient was provided an opportunity to ask questions and all were answered. The patient agreed with the plan and demonstrated an understanding of the instructions.   The patient was advised to call back or seek an in-person evaluation  if the symptoms worsen or if the condition fails to improve as anticipated.    Chaya Jan, MD  [facility] at [facility]

## 2020-09-23 DIAGNOSIS — H35423 Microcystoid degeneration of retina, bilateral: Secondary | ICD-10-CM | POA: Diagnosis not present

## 2020-09-23 DIAGNOSIS — H353123 Nonexudative age-related macular degeneration, left eye, advanced atrophic without subfoveal involvement: Secondary | ICD-10-CM | POA: Diagnosis not present

## 2020-09-23 DIAGNOSIS — H43813 Vitreous degeneration, bilateral: Secondary | ICD-10-CM | POA: Diagnosis not present

## 2020-09-23 DIAGNOSIS — H353211 Exudative age-related macular degeneration, right eye, with active choroidal neovascularization: Secondary | ICD-10-CM | POA: Diagnosis not present

## 2020-11-03 ENCOUNTER — Telehealth: Payer: Self-pay | Admitting: Pharmacist

## 2020-11-03 NOTE — Chronic Care Management (AMB) (Signed)
Chronic Care Management Pharmacy Assistant   Name: Katie Booker  MRN: [id number] DOB: 02-08-1946   Reason for Encounter: Disease State/ Hypertension Assessment Call    Conditions to be addressed/monitored: HTN  Recent office visits:  09-19-2020 Philip Aspen, Limmie Patricia, MD - Patient presented via tele-health for Chronic midline low back pain without sciatica and other concerns. No medication changes.  Recent consult visits:  None  Hospital visits:  None in previous 6 months  Medications: Outpatient Encounter Medications as of 11/03/2020  Medication Sig   ALPRAZolam (XANAX) 0.5 MG tablet Take 1 tablet (0.5 mg total) by mouth 2 (two) times daily as needed. for anxiety   atenolol (TENORMIN) 25 MG tablet TAKE 1 TABLET BY MOUTH  DAILY   atorvastatin (LIPITOR) 40 MG tablet TAKE 1 TABLET BY MOUTH  DAILY   Calcium Carb-Cholecalciferol (CALCIUM 600/VITAMIN D3 PO) Take 1 tablet by mouth 2 (two) times daily.   Carboxymethylcellulose Sodium (LUBRICANT EYE DROPS OP) Apply 2 drops to eye 4 (four) times daily as needed (dry eyes).    citalopram (CELEXA) 40 MG tablet TAKE 1 TABLET BY MOUTH  DAILY   docusate sodium (COLACE) 100 MG capsule Take 200 mg by mouth at bedtime.    estradiol (ESTRACE) 0.1 MG/GM vaginal cream PLACE 1 APPLICATORFUL  VAGINALLY 3 TIMES A WEEK.   gemfibrozil (LOPID) 600 MG tablet TAKE 1 TABLET BY MOUTH  TWICE DAILY BEFORE MEALS   HYDROcodone-acetaminophen (NORCO) 7.5-325 MG tablet Take 1 tablet by mouth every 6 (six) hours as needed for moderate pain.   HYDROcodone-acetaminophen (NORCO) 7.5-325 MG tablet Take 1 tablet by mouth every 6 (six) hours as needed for moderate pain.   HYDROcodone-acetaminophen (NORCO) 7.5-325 MG tablet Take 1 tablet by mouth every 6 (six) hours as needed for moderate pain.   hydrocortisone 2.5 % cream APPLY TO AFFECTED AREA(S)  TOPICALLY TWICE DAILY   Lancets (ONETOUCH ULTRASOFT) lancets Use daily   levothyroxine (SYNTHROID) 137 MCG tablet  TAKE 1 TABLET (137 MCG TOTAL) BY MOUTH DAILY.   lisinopril (ZESTRIL) 10 MG tablet TAKE 1 TABLET BY MOUTH  DAILY   loratadine (CLARITIN) 10 MG tablet Take 10 mg by mouth daily as needed for allergies.   meloxicam (MOBIC) 15 MG tablet TAKE 1 TABLET BY MOUTH  DAILY   metFORMIN (GLUCOPHAGE) 1000 MG tablet TAKE 1 TABLET BY MOUTH TWO  TIMES DAILY WITH MEALS   Misc Natural Products (TURMERIC CURCUMIN) CAPS Take by mouth.   Misc. Devices (BARIATRIC ROLLATOR) MISC 1 Rollator with basket.   Multiple Vitamin (MULTIVITAMIN) tablet Take 1 tablet by mouth daily.   Omega-3 Fatty Acids (FISH OIL) 1200 MG CAPS Take 1 capsule by mouth daily.    ONE TOUCH ULTRA TEST test strip USE 1 DAILY AS DIRECTED   pantoprazole (PROTONIX) 40 MG tablet TAKE 1 TABLET BY MOUTH  TWICE DAILY BEFORE MEALS   pioglitazone (ACTOS) 15 MG tablet TAKE 1 TABLET BY MOUTH  DAILY   pregabalin (LYRICA) 75 MG capsule Take 1 capsule (75 mg total) by mouth daily.   rOPINIRole (REQUIP) 1 MG tablet TAKE 1 TABLET BY MOUTH AT  BEDTIME   tiZANidine (ZANAFLEX) 4 MG tablet TAKE 1 TABLET BY MOUTH 3  TIMES DAILY AS NEEDED   tobramycin (TOBREX) 0.3 % ophthalmic solution Place 1 drop into both eyes every 6 (six) hours. Day before injection, day of injection and day after injection   traZODone (DESYREL) 100 MG tablet TAKE 1 TO 2 TABLETS BY  MOUTH AT BEDTIME  TURMERIC PO Take by mouth.   Vitamin D, Ergocalciferol, (DRISDOL) 1.25 MG (50000 UNIT) CAPS capsule TAKE 1 CAPSULE BY MOUTH  EVERY 7 DAYS FOR 12 DOSES   No facility-administered encounter medications on file as of 11/03/2020.  Reviewed chart prior to disease state call. Spoke with patient regarding BP  Recent Office Vitals: BP Readings from Last 3 Encounters:  03/27/20 120/80  12/05/19 118/80  08/15/18 110/68   Pulse Readings from Last 3 Encounters:  03/27/20 67  12/05/19 78  08/15/18 83    Wt Readings from Last 3 Encounters:  09/19/20 186 lb (84.4 kg)  06/24/20 193 lb (87.5 kg)  03/27/20  196 lb 3.2 oz (89 kg)     Kidney Function Lab Results  Component Value Date/Time   CREATININE 0.95 05/16/2020 10:07 AM   CREATININE 1.25 (H) 12/05/2019 09:41 AM   CREATININE 0.89 08/15/2018 09:25 AM   GFR 59.15 (L) 05/16/2020 10:07 AM   GFRNONAA >60 12/28/2017 05:24 AM   GFRAA >60 12/28/2017 05:24 AM    BMP Latest Ref Rng & Units 05/16/2020 12/05/2019 08/15/2018  Glucose 70 - 99 mg/dL 98 83 91  BUN 6 - 23 mg/dL 45(X) 64(W) 80(H)  Creatinine 0.40 - 1.20 mg/dL 2.12 2.48(G) 5.00  BUN/Creat Ratio 6 - 22 (calc) - 25(H) -  Sodium 135 - 145 mEq/L 141 138 138  Potassium 3.5 - 5.1 mEq/L 4.1 5.6(H) 4.8  Chloride 96 - 112 mEq/L 101 103 101  CO2 19 - 32 mEq/L 28 25 27   Calcium 8.4 - 10.5 mg/dL 9.8 9.4 9.4    Current antihypertensive regimen:  Atenolol 25 mg 1 tablet daily - in AM Lisinopril 10 mg 1 tablet daily   Care Gaps: Foot Exam - Overdue Eye Exam - Overdue HGB A1C - Overdue Flu Vaccine - Overdue  AWV - MSG sent to CMA to schedule. CCM - Needed  Star Rating Drugs: Pioglitazone (Actos) 15 mg - Last filled 09-12-2020 90 DS at [facility] Metformin (Glucophage) 1000 mg - Last filled 09-25-2020 90 DS at [facility] Lisinopril (Zestril) 10 mg - Last filled 10-03-2020 90 DS at [facility] Atorvastatin (Lipitor) 40 mg - Last filled 08-26-2020 90 DS at [facility]  Notes:  Attempted to reach on 11-03-20 left vm Attempted to reach on 11-05-2020 left vm  3rd attempt to reach on 11-07-2020  11-09-2020 [facility] Clinical Pharmacist Assistant [phone]

## 2020-11-04 ENCOUNTER — Encounter: Payer: Self-pay | Admitting: Internal Medicine

## 2020-11-05 ENCOUNTER — Other Ambulatory Visit: Payer: Self-pay

## 2020-11-06 ENCOUNTER — Ambulatory Visit (INDEPENDENT_AMBULATORY_CARE_PROVIDER_SITE_OTHER): Payer: Medicare Other | Admitting: Internal Medicine

## 2020-11-06 ENCOUNTER — Encounter: Payer: Self-pay | Admitting: Internal Medicine

## 2020-11-06 VITALS — BP 120/70 | HR 76 | Temp 98.4°F | Ht 61.0 in | Wt 189.7 lb

## 2020-11-06 DIAGNOSIS — M48062 Spinal stenosis, lumbar region with neurogenic claudication: Secondary | ICD-10-CM

## 2020-11-06 DIAGNOSIS — E119 Type 2 diabetes mellitus without complications: Secondary | ICD-10-CM | POA: Diagnosis not present

## 2020-11-06 DIAGNOSIS — I10 Essential (primary) hypertension: Secondary | ICD-10-CM

## 2020-11-06 DIAGNOSIS — E039 Hypothyroidism, unspecified: Secondary | ICD-10-CM

## 2020-11-06 DIAGNOSIS — F419 Anxiety disorder, unspecified: Secondary | ICD-10-CM

## 2020-11-06 DIAGNOSIS — E785 Hyperlipidemia, unspecified: Secondary | ICD-10-CM

## 2020-11-06 DIAGNOSIS — Z23 Encounter for immunization: Secondary | ICD-10-CM

## 2020-11-06 DIAGNOSIS — N814 Uterovaginal prolapse, unspecified: Secondary | ICD-10-CM

## 2020-11-06 LAB — POCT GLYCOSYLATED HEMOGLOBIN (HGB A1C): Hemoglobin A1C: 5.7 % — AB (ref 4.0–5.6)

## 2020-11-06 MED ORDER — HYDROCODONE-ACETAMINOPHEN 7.5-325 MG PO TABS: 1.0000 | ORAL_TABLET | Freq: Four times a day (QID) | ORAL | 0 refills | Status: DC | PRN

## 2020-11-06 MED ORDER — ALPRAZOLAM 0.5 MG PO TABS: 0.5000 mg | ORAL_TABLET | Freq: Two times a day (BID) | ORAL | 2 refills | Status: DC | PRN

## 2020-11-06 MED ORDER — HYDROCODONE-ACETAMINOPHEN 7.5-325 MG PO TABS
1.0000 | ORAL_TABLET | Freq: Four times a day (QID) | ORAL | 0 refills | Status: DC | PRN
Start: 2020-11-06 — End: 2021-02-05

## 2020-11-06 NOTE — Addendum Note (Signed)
Addended by: Kern Reap B on: 11/06/2020 04:10 PM   Modules accepted: Orders

## 2020-11-06 NOTE — Patient Instructions (Signed)
-  Nice seeing you today!!  -Flu vaccine today.  -Remember your COVID booster.  -Schedule follow up in 3 months for your physical. Please come in fasting that day.

## 2020-11-06 NOTE — Progress Notes (Signed)
Established Patient Office Visit     This visit occurred during the SARS-CoV-2 public health emergency.  Safety protocols were in place, including screening questions prior to the visit, additional usage of staff PPE, and extensive cleaning of exam room while observing appropriate contact time as indicated for disinfecting solutions.    CC/Reason for Visit: Follow-up chronic medical conditions  HPI: Katie Booker is a 75 y.o. female who is coming in today for the above mentioned reasons. Past Medical History is significant for: Spinal stenosis of cervical, thoracic, lumbar spine followed by Dr. Danielle Dess and on chronic hydrocodone with the pain contract through me, anxiety on Xanax, insomnia on trazodone, diabetes, hypertension, hyperlipidemia, hypothyroidism.  She also had a left knee replacement in November 2019.  She is concerned that her back pain is increasing again.  She wonders about seeing Dr. Danielle Dess and a pain management specialist.  She is already on hydrocodone pain contract with me.  She gets 120 tablets a month.  She is requesting a flu vaccine today.  She has a uterine prolapse and it is causing urinary frequency and incontinence.  She is requesting a GYN referral.   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   Depression    DOE (dyspnea on exertion)    GERD    Headache    Hemorrhoids    History of basal cell carcinoma excision    left cheek   History of colon polyps    History of hyperthyroidism 2011   RAI treatement   History of panic attacks    Hyperlipidemia    Hypertension    Hypothyroidism, postradioiodine therapy    followed by pcp   IDA (iron deficiency anemia)     intermittant   Insomnia    OA (osteoarthritis)    OSA (obstructive sleep apnea)    no cpap, per pt tried unable to tolerate   Peripheral neuropathy    legs and feet from hx back surgery's   Rash    bilateral axilla area -- per pt due to some personal wipes used other the counter   RLS (restless legs syndrome)    Spondylolisthesis at L1-L2 level    Tingling of both upper extremities    left greater than right due to cervical pinched nerve   Type 2 diabetes mellitus (HCC)    followed by pcp   Umbilical hernia    Urinary frequency    Urinary urgency     Past Surgical History:  Procedure Laterality Date   ANTERIOR CERVICAL DECOMP/DISCECTOMY FUSION N/A 09/02/2014   Procedure: Cervical five- six  Anterior cervical decompression fusion;  Surgeon: Barnett Abu, MD;  Location: [facility];  Service: Neurosurgery;  Laterality: N/A;  C5-6 Anterior cervical decompression/diskectomy/fusion   BREAST BIOPSY Right 10/2018   x 2 benign   CARPAL TUNNEL RELEASE Right 2016   CATARACT EXTRACTION W/ INTRAOCULAR LENS  IMPLANT, BILATERAL  03/2015   COLONOSCOPY  2007   ESOPHAGOGASTRODUODENOSCOPY  2007   HEMORRHOID SURGERY  03/23/2011   Procedure: HEMORRHOIDECTOMY;  Surgeon: Clovis Pu. Cornett, MD;  Location: [facility];  Service: General;  Laterality:  N/A;  lateral internal sphincterotomy and hemorrhoidectomy   LUMBAR FUSION  02/2015   L1 -- 2   PARTIAL KNEE ARTHROPLASTY Left 12/27/2017   Procedure: UNICOMPARTMENTAL LEFT KNEE;  Surgeon: Teryl Lucy, MD;  Location: [facility];  Service: Orthopedics;  Laterality: Left;   THORACIC DISCECTOMY N/A 12/15/2012   Procedure: Thoracic ten-eleven Thoracic laminectomy;  Surgeon: Barnett Abu, MD;  Location: [facility];  Service: Neurosurgery;  Laterality: N/A;  Thoracic ten-eleven Thoracic laminectomy   TUBAL LIGATION Bilateral 1991    Social History:  reports that she quit smoking about 16 years ago. Her smoking use included cigarettes. She has  a 20.00 pack-year smoking history. She has never used smokeless tobacco. She reports current alcohol use. She reports that she does not use drugs.  Allergies: Allergies  Allergen Reactions   Duraprep [Antiseptic Products, Misc.] Itching and Rash    Irritation everywhere prep was used   Advil Allergy Sinus [Chlorpheniramine-Pse-Ibuprofen]     Nervousness    Betadine [Povidone Iodine] Other (See Comments)    Burning sensation.    Hydromorphone Hcl Other (See Comments)    Makes crazy   Morphine And Related Other (See Comments)    hallucinations    Family History:  Family History  Problem Relation Age of Onset   Diabetes Father    COPD Father    Cancer Maternal Grandfather        stomach     Current Outpatient Medications:    ALPRAZolam (XANAX) 0.5 MG tablet, Take 1 tablet (0.5 mg total) by mouth 2 (two) times daily as needed. for anxiety, Disp: 60 tablet, Rfl: 2   atenolol (TENORMIN) 25 MG tablet, TAKE 1 TABLET BY MOUTH  DAILY, Disp: 90 tablet, Rfl: 3   atorvastatin (LIPITOR) 40 MG tablet, TAKE 1 TABLET BY MOUTH  DAILY, Disp: 90 tablet, Rfl: 1   Calcium Carb-Cholecalciferol (CALCIUM 600/VITAMIN D3 PO), Take 1 tablet by mouth 2 (two) times daily., Disp: , Rfl:    Carboxymethylcellulose Sodium (LUBRICANT EYE DROPS OP), Apply 2 drops to eye 4 (four) times daily as needed (dry eyes). , Disp: , Rfl:    citalopram (CELEXA) 40 MG tablet, TAKE 1 TABLET BY MOUTH  DAILY, Disp: 90 tablet, Rfl: 3   docusate sodium (COLACE) 100 MG capsule, Take 200 mg by mouth at bedtime. , Disp: , Rfl:    estradiol (ESTRACE) 0.1 MG/GM vaginal cream, PLACE 1 APPLICATORFUL  VAGINALLY 3 TIMES A WEEK., Disp: 170 g, Rfl: 3   gemfibrozil (LOPID) 600 MG tablet, TAKE 1 TABLET BY MOUTH  TWICE DAILY BEFORE MEALS, Disp: 180 tablet, Rfl: 3   HYDROcodone-acetaminophen (NORCO) 7.5-325 MG tablet, Take 1 tablet by mouth every 6 (six) hours as needed for moderate pain., Disp: 120 tablet, Rfl: 0   HYDROcodone-acetaminophen  (NORCO) 7.5-325 MG tablet, Take 1 tablet by mouth every 6 (six) hours as needed for moderate pain., Disp: 120 tablet, Rfl: 0   HYDROcodone-acetaminophen (NORCO) 7.5-325 MG tablet, Take 1 tablet by mouth every 6 (six) hours as needed for moderate pain., Disp: 120 tablet, Rfl: 0   hydrocortisone 2.5 % cream, APPLY TO AFFECTED AREA(S)  TOPICALLY TWICE DAILY, Disp: 85.05 g, Rfl: 1   Lancets (ONETOUCH ULTRASOFT) lancets, Use daily, Disp: 100 each, Rfl: 6   levothyroxine (SYNTHROID) 137 MCG tablet, TAKE 1 TABLET (137 MCG TOTAL) BY MOUTH DAILY., Disp: 90 tablet, Rfl: 1   lisinopril (ZESTRIL) 10 MG tablet, TAKE 1 TABLET BY MOUTH  DAILY, Disp: 90 tablet, Rfl: 3  loratadine (CLARITIN) 10 MG tablet, Take 10 mg by mouth daily as needed for allergies., Disp: , Rfl:    meloxicam (MOBIC) 15 MG tablet, TAKE 1 TABLET BY MOUTH  DAILY, Disp: 90 tablet, Rfl: 3   metFORMIN (GLUCOPHAGE) 1000 MG tablet, TAKE 1 TABLET BY MOUTH TWO  TIMES DAILY WITH MEALS, Disp: 180 tablet, Rfl: 3   Misc Natural Products (TURMERIC CURCUMIN) CAPS, Take by mouth., Disp: , Rfl:    Misc. Devices (BARIATRIC ROLLATOR) MISC, 1 Rollator with basket., Disp: 1 each, Rfl: 0   Multiple Vitamin (MULTIVITAMIN) tablet, Take 1 tablet by mouth daily., Disp: , Rfl:    Omega-3 Fatty Acids (FISH OIL) 1200 MG CAPS, Take 1 capsule by mouth daily. , Disp: , Rfl:    ONE TOUCH ULTRA TEST test strip, USE 1 DAILY AS DIRECTED, Disp: 100 each, Rfl: 3   pantoprazole (PROTONIX) 40 MG tablet, TAKE 1 TABLET BY MOUTH  TWICE DAILY BEFORE MEALS, Disp: 180 tablet, Rfl: 1   pioglitazone (ACTOS) 15 MG tablet, TAKE 1 TABLET BY MOUTH  DAILY, Disp: 90 tablet, Rfl: 3   pregabalin (LYRICA) 75 MG capsule, Take 1 capsule (75 mg total) by mouth daily., Disp: 90 capsule, Rfl: 1   rOPINIRole (REQUIP) 1 MG tablet, TAKE 1 TABLET BY MOUTH AT  BEDTIME, Disp: 90 tablet, Rfl: 3   tiZANidine (ZANAFLEX) 4 MG tablet, TAKE 1 TABLET BY MOUTH 3  TIMES DAILY AS NEEDED, Disp: 180 tablet, Rfl: 1    tobramycin (TOBREX) 0.3 % ophthalmic solution, Place 1 drop into both eyes every 6 (six) hours. Day before injection, day of injection and day after injection, Disp: , Rfl:    traZODone (DESYREL) 100 MG tablet, TAKE 1 TO 2 TABLETS BY  MOUTH AT BEDTIME, Disp: 180 tablet, Rfl: 3   TURMERIC PO, Take by mouth., Disp: , Rfl:    Vitamin D, Ergocalciferol, (DRISDOL) 1.25 MG (50000 UNIT) CAPS capsule, TAKE 1 CAPSULE BY MOUTH  EVERY 7 DAYS FOR 12 DOSES, Disp: 12 capsule, Rfl: 3  Review of Systems:  Constitutional: Denies fever, chills, diaphoresis, appetite change and fatigue.  HEENT: Denies photophobia, eye pain, redness, hearing loss, ear pain, congestion, sore throat, rhinorrhea, sneezing, mouth sores, trouble swallowing, neck pain, neck stiffness and tinnitus.   Respiratory: Denies SOB, DOE, cough, chest tightness,  and wheezing.   Cardiovascular: Denies chest pain, palpitations and leg swelling.  Gastrointestinal: Denies nausea, vomiting, abdominal pain, diarrhea, constipation, blood in stool and abdominal distention.  Genitourinary: Denies dysuria, hematuria, flank pain and difficulty urinating.  Endocrine: Denies: hot or cold intolerance, sweats, changes in hair or nails, polyuria, polydipsia. Musculoskeletal: Denies myalgias, back pain, joint swelling, arthralgias and gait problem.  Skin: Denies pallor, rash and wound.  Neurological: Denies dizziness, seizures, syncope, weakness, light-headedness, numbness and headaches.  Hematological: Denies adenopathy. Easy bruising, personal or family bleeding history  Psychiatric/Behavioral: Denies suicidal ideation, mood changes, confusion, nervousness, sleep disturbance and agitation    Physical Exam: Vitals:   11/06/20 1436  BP: 120/70  Pulse: 76  Temp: 98.4 F (36.9 C)  TempSrc: Oral  SpO2: 97%  Weight: 189 lb 11.2 oz (86 kg)  Height: 5\' 1"  (1.549 m)    Body mass index is 35.84 kg/m.   Constitutional: NAD, calm, comfortable, ambulates  with a walker Eyes: PERRL, lids and conjunctivae normal ENMT: Mucous membranes are moist. Posterior pharynx clear of any exudate or lesions. Normal dentition. Respiratory: clear to auscultation bilaterally, no wheezing, no crackles. Normal respiratory effort. No accessory muscle  use.  Cardiovascular: Regular rate and rhythm, no murmurs / rubs / gallops. No extremity edema.  Neurologic: Grossly intact and nonfocal Psychiatric: Normal judgment and insight. Alert and oriented x 3. Normal mood.    Impression and Plan:  Diabetes mellitus without complication (HCC)  - Plan: POC HgB A1c -A1c is well controlled today at 5.7.  Anxiety  - Plan: ALPRAZolam (XANAX) 0.5 MG tablet  Spinal stenosis, lumbar region, with neurogenic claudication  -PDMP reviewed, overdose risk score is 310, she has 1 red flag for greater than 4 dispensing pharmacies, however all prescriptions have been from me. -I will refill hydrocodone 7.5/325 mg to take 1 tablet every 6 hours as needed for pain for total of 120 tablets a month x3 months. -Due to recent increase in back pain, I agree that seeing Dr. Danielle Dess again is reasonable.  There might be something they can do such as epidural injections to help alleviate pain.  Essential hypertension -Well-controlled.  Acquired hypothyroidism -Continue current levothyroxine dose.  Dyslipidemia -Recheck lipids when she returns for CPE.  Uterine prolapse  - Plan: Ambulatory referral to Gynecology  Need for influenza vaccination -Flu vaccine administered today  Time spent: 34 minutes reviewing chart, interviewing and examining patient and formulating plan of care.   Patient Instructions  -Nice seeing you today!!  -Flu vaccine today.  -Remember your COVID booster.  -Schedule follow up in 3 months for your physical. Please come in fasting that day.    Chaya Jan, MD [facility]

## 2020-11-19 ENCOUNTER — Other Ambulatory Visit: Payer: Self-pay | Admitting: Internal Medicine

## 2020-11-25 DIAGNOSIS — H35423 Microcystoid degeneration of retina, bilateral: Secondary | ICD-10-CM | POA: Diagnosis not present

## 2020-11-25 DIAGNOSIS — H35372 Puckering of macula, left eye: Secondary | ICD-10-CM | POA: Diagnosis not present

## 2020-11-25 DIAGNOSIS — H353211 Exudative age-related macular degeneration, right eye, with active choroidal neovascularization: Secondary | ICD-10-CM | POA: Diagnosis not present

## 2020-11-25 DIAGNOSIS — H35433 Paving stone degeneration of retina, bilateral: Secondary | ICD-10-CM | POA: Diagnosis not present

## 2020-11-25 DIAGNOSIS — H353123 Nonexudative age-related macular degeneration, left eye, advanced atrophic without subfoveal involvement: Secondary | ICD-10-CM | POA: Diagnosis not present

## 2020-11-28 ENCOUNTER — Other Ambulatory Visit: Payer: Self-pay | Admitting: Internal Medicine

## 2020-11-28 DIAGNOSIS — Z1231 Encounter for screening mammogram for malignant neoplasm of breast: Secondary | ICD-10-CM

## 2020-12-12 ENCOUNTER — Other Ambulatory Visit: Payer: Self-pay | Admitting: Internal Medicine

## 2020-12-12 DIAGNOSIS — G4700 Insomnia, unspecified: Secondary | ICD-10-CM

## 2020-12-22 DIAGNOSIS — G4733 Obstructive sleep apnea (adult) (pediatric): Secondary | ICD-10-CM | POA: Diagnosis not present

## 2020-12-22 DIAGNOSIS — F32A Depression, unspecified: Secondary | ICD-10-CM | POA: Diagnosis not present

## 2020-12-22 DIAGNOSIS — I10 Essential (primary) hypertension: Secondary | ICD-10-CM | POA: Diagnosis not present

## 2020-12-31 DIAGNOSIS — G4733 Obstructive sleep apnea (adult) (pediatric): Secondary | ICD-10-CM | POA: Diagnosis not present

## 2021-01-02 ENCOUNTER — Ambulatory Visit: Payer: Medicare Other

## 2021-01-13 ENCOUNTER — Other Ambulatory Visit: Payer: Self-pay | Admitting: Internal Medicine

## 2021-01-13 ENCOUNTER — Telehealth: Payer: Self-pay | Admitting: Internal Medicine

## 2021-01-13 DIAGNOSIS — G8929 Other chronic pain: Secondary | ICD-10-CM

## 2021-01-13 NOTE — Telephone Encounter (Signed)
Pt call and stated she need a refill on HYDROcodone-acetaminophen (NORCO) 7.5-325 MG tablet  sent to  [id number] [place], [place] - [address] Phone:  [phone]  Fax:  [phone]    Pt stated she will be out of her med in to days .

## 2021-01-14 ENCOUNTER — Other Ambulatory Visit: Payer: Self-pay

## 2021-01-14 ENCOUNTER — Encounter: Payer: Self-pay | Admitting: Internal Medicine

## 2021-01-14 ENCOUNTER — Ambulatory Visit
Admission: RE | Admit: 2021-01-14 | Discharge: 2021-01-14 | Disposition: A | Discharge status: Home / Self Care | Payer: Medicare Other | Source: Ambulatory Visit | Attending: Internal Medicine | Admitting: Internal Medicine

## 2021-01-14 DIAGNOSIS — Z1231 Encounter for screening mammogram for malignant neoplasm of breast: Secondary | ICD-10-CM

## 2021-01-16 ENCOUNTER — Other Ambulatory Visit: Payer: Self-pay | Admitting: Internal Medicine

## 2021-01-20 ENCOUNTER — Other Ambulatory Visit: Payer: Self-pay | Admitting: Internal Medicine

## 2021-01-20 DIAGNOSIS — E039 Hypothyroidism, unspecified: Secondary | ICD-10-CM

## 2021-01-21 ENCOUNTER — Encounter: Payer: Self-pay | Admitting: Internal Medicine

## 2021-01-21 DIAGNOSIS — G4733 Obstructive sleep apnea (adult) (pediatric): Secondary | ICD-10-CM | POA: Diagnosis not present

## 2021-01-21 DIAGNOSIS — I10 Essential (primary) hypertension: Secondary | ICD-10-CM | POA: Diagnosis not present

## 2021-01-21 DIAGNOSIS — F32A Depression, unspecified: Secondary | ICD-10-CM | POA: Diagnosis not present

## 2021-01-22 ENCOUNTER — Other Ambulatory Visit: Payer: Self-pay | Admitting: Internal Medicine

## 2021-01-22 DIAGNOSIS — M549 Dorsalgia, unspecified: Secondary | ICD-10-CM

## 2021-01-22 DIAGNOSIS — G8929 Other chronic pain: Secondary | ICD-10-CM

## 2021-02-03 DIAGNOSIS — H35372 Puckering of macula, left eye: Secondary | ICD-10-CM | POA: Diagnosis not present

## 2021-02-03 DIAGNOSIS — H43813 Vitreous degeneration, bilateral: Secondary | ICD-10-CM | POA: Diagnosis not present

## 2021-02-03 DIAGNOSIS — H353123 Nonexudative age-related macular degeneration, left eye, advanced atrophic without subfoveal involvement: Secondary | ICD-10-CM | POA: Diagnosis not present

## 2021-02-03 DIAGNOSIS — H353211 Exudative age-related macular degeneration, right eye, with active choroidal neovascularization: Secondary | ICD-10-CM | POA: Diagnosis not present

## 2021-02-05 ENCOUNTER — Ambulatory Visit (INDEPENDENT_AMBULATORY_CARE_PROVIDER_SITE_OTHER): Payer: Medicare Other | Admitting: Internal Medicine

## 2021-02-05 ENCOUNTER — Encounter: Payer: Self-pay | Admitting: Internal Medicine

## 2021-02-05 VITALS — BP 130/80 | HR 88 | Temp 98.4°F | Ht 60.0 in | Wt 188.4 lb

## 2021-02-05 DIAGNOSIS — E785 Hyperlipidemia, unspecified: Secondary | ICD-10-CM | POA: Diagnosis not present

## 2021-02-05 DIAGNOSIS — I10 Essential (primary) hypertension: Secondary | ICD-10-CM

## 2021-02-05 DIAGNOSIS — G4733 Obstructive sleep apnea (adult) (pediatric): Secondary | ICD-10-CM | POA: Diagnosis not present

## 2021-02-05 DIAGNOSIS — E039 Hypothyroidism, unspecified: Secondary | ICD-10-CM | POA: Diagnosis not present

## 2021-02-05 DIAGNOSIS — M48062 Spinal stenosis, lumbar region with neurogenic claudication: Secondary | ICD-10-CM | POA: Diagnosis not present

## 2021-02-05 DIAGNOSIS — E1169 Type 2 diabetes mellitus with other specified complication: Secondary | ICD-10-CM | POA: Diagnosis not present

## 2021-02-05 DIAGNOSIS — F419 Anxiety disorder, unspecified: Secondary | ICD-10-CM

## 2021-02-05 DIAGNOSIS — Z0000 Encounter for general adult medical examination without abnormal findings: Secondary | ICD-10-CM

## 2021-02-05 DIAGNOSIS — F331 Major depressive disorder, recurrent, moderate: Secondary | ICD-10-CM

## 2021-02-05 LAB — COMPREHENSIVE METABOLIC PANEL
ALT: 17 U/L (ref 0–35)
AST: 18 U/L (ref 0–37)
Albumin: 4.2 g/dL (ref 3.5–5.2)
Alkaline Phosphatase: 63 U/L (ref 39–117)
BUN: 25 mg/dL — ABNORMAL HIGH (ref 6–23)
CO2: 28 mEq/L (ref 19–32)
Calcium: 9.8 mg/dL (ref 8.4–10.5)
Chloride: 103 mEq/L (ref 96–112)
Creatinine, Ser: 1.15 mg/dL (ref 0.40–1.20)
GFR: 46.79 mL/min — ABNORMAL LOW (ref >60.00)
Glucose, Bld: 87 mg/dL (ref 70–99)
Potassium: 4.9 mEq/L (ref 3.5–5.1)
Sodium: 140 mEq/L (ref 135–145)
Total Bilirubin: 0.3 mg/dL (ref 0.2–1.2)
Total Protein: 6.7 g/dL (ref 6.0–8.3)

## 2021-02-05 LAB — LIPID PANEL
Cholesterol: 158 mg/dL (ref 0–200)
HDL: 69.1 mg/dL (ref >39.00)
LDL Cholesterol: 59 mg/dL (ref 0–99)
NonHDL: 88.85
Total CHOL/HDL Ratio: 2
Triglycerides: 148 mg/dL (ref 0.0–149.0)
VLDL: 29.6 mg/dL (ref 0.0–40.0)

## 2021-02-05 LAB — CBC WITH DIFFERENTIAL/PLATELET
Basophils Absolute: 0 10*3/uL (ref 0.0–0.1)
Basophils Relative: 0.6 % (ref 0.0–3.0)
Eosinophils Absolute: 0.2 10*3/uL (ref 0.0–0.7)
Eosinophils Relative: 4.8 % (ref 0.0–5.0)
HCT: 35.6 % — ABNORMAL LOW (ref 36.0–46.0)
Hemoglobin: 11.7 g/dL — ABNORMAL LOW (ref 12.0–15.0)
Lymphocytes Relative: 35 % (ref 12.0–46.0)
Lymphs Abs: 1.6 10*3/uL (ref 0.7–4.0)
MCHC: 32.8 g/dL (ref 30.0–36.0)
MCV: 86.9 fl (ref 78.0–100.0)
Monocytes Absolute: 0.4 10*3/uL (ref 0.1–1.0)
Monocytes Relative: 8.7 % (ref 3.0–12.0)
Neutro Abs: 2.3 10*3/uL (ref 1.4–7.7)
Neutrophils Relative %: 50.9 % (ref 43.0–77.0)
Platelets: 175 10*3/uL (ref 150.0–400.0)
RBC: 4.1 Mil/uL (ref 3.87–5.11)
RDW: 13.1 % (ref 11.5–15.5)
WBC: 4.5 10*3/uL (ref 4.0–10.5)

## 2021-02-05 LAB — VITAMIN B12: Vitamin B-12: 411 pg/mL (ref 211–911)

## 2021-02-05 LAB — TSH: TSH: 0.34 u[IU]/mL — ABNORMAL LOW (ref 0.35–5.50)

## 2021-02-05 LAB — VITAMIN D 25 HYDROXY (VIT D DEFICIENCY, FRACTURES): VITD: 46.32 ng/mL (ref 30.00–100.00)

## 2021-02-05 LAB — HEMOGLOBIN A1C: Hgb A1c MFr Bld: 6.1 % (ref 4.6–6.5)

## 2021-02-05 MED ORDER — HYDROCODONE-ACETAMINOPHEN 7.5-325 MG PO TABS: 1.0000 | ORAL_TABLET | Freq: Four times a day (QID) | ORAL | 0 refills | Status: DC | PRN

## 2021-02-05 MED ORDER — ALPRAZOLAM 0.5 MG PO TABS
0.5000 mg | ORAL_TABLET | Freq: Two times a day (BID) | ORAL | 2 refills | Status: DC | PRN
Start: 2021-02-05 — End: 2021-05-06

## 2021-02-05 NOTE — Patient Instructions (Signed)
-  Nice seeing you today!!  -Lab work today; will notify you once results are available.  -Remember your COVID booster.  -Schedule follow up in 3 months. Can be virtual.

## 2021-02-05 NOTE — Progress Notes (Signed)
Established Patient Office Visit     This visit occurred during the SARS-CoV-2 public health emergency.  Safety protocols were in place, including screening questions prior to the visit, additional usage of staff PPE, and extensive cleaning of exam room while observing appropriate contact time as indicated for disinfecting solutions.    CC/Reason for Visit: Annual preventive exam and subsequent Medicare wellness visit  HPI: Katie Booker is a 75 y.o. female who is coming in today for the above mentioned reasons. Past Medical History is significant for: Spinal stenosis of cervical, thoracic, lumbar spine followed by Dr. Ellene Route and on chronic hydrocodone with the pain contract through me, anxiety on Xanax, insomnia on trazodone, diabetes, hypertension, hyperlipidemia, hypothyroidism.  She has routine eye and dental care.  She continues to have chronic pain and will schedule follow-up with her neurosurgeon to discuss.  She is due for hydrocodone refills per pain contract.  She is due to update her pain contract today.  She is overdue for GYN exam.  She had a DEXA scan this year, states she did a Cologuard earlier this year, mammogram in November.  Immunizations are up-to-date.   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   Depression    DOE (dyspnea on exertion)    GERD    Headache    Hemorrhoids    History of basal cell carcinoma excision    left cheek   History of colon polyps    History of hyperthyroidism 2011   RAI treatement   History of panic attacks    Hyperlipidemia    Hypertension    Hypothyroidism, postradioiodine therapy    followed by pcp   IDA (iron deficiency anemia)     intermittant   Insomnia    OA (osteoarthritis)    OSA (obstructive sleep apnea)    no cpap, per pt tried unable to tolerate   Peripheral neuropathy    legs and feet from hx back surgery's   Rash    bilateral axilla area -- per pt due to some personal wipes used other the counter   RLS (restless legs syndrome)    Spondylolisthesis at L1-L2 level    Tingling of both upper extremities    left greater than right due to cervical pinched nerve   Type 2 diabetes mellitus ([place])    followed by pcp   Umbilical hernia    Urinary frequency    Urinary urgency     Past Surgical History:  Procedure Laterality Date   ANTERIOR CERVICAL DECOMP/DISCECTOMY FUSION N/A 09/02/2014   Procedure: Cervical five- six  Anterior cervical decompression fusion;  Surgeon: Kristeen Miss, MD;  Location: [facility];  Service: Neurosurgery;  Laterality: N/A;  C5-6 Anterior cervical decompression/diskectomy/fusion   BREAST BIOPSY Right 10/2018   x 2 benign   CARPAL TUNNEL RELEASE Right 2016   CATARACT EXTRACTION W/ INTRAOCULAR LENS  IMPLANT, BILATERAL  03/2015   COLONOSCOPY  2007   ESOPHAGOGASTRODUODENOSCOPY  2007   HEMORRHOID SURGERY  03/23/2011   Procedure: HEMORRHOIDECTOMY;  Surgeon: Joyice Faster. Cornett, MD;  Location: [facility];  Service: General;  Laterality: N/A;  lateral internal sphincterotomy and hemorrhoidectomy   LUMBAR FUSION  02/2015   L1 -- 2   PARTIAL KNEE ARTHROPLASTY Left 12/27/2017   Procedure: UNICOMPARTMENTAL LEFT KNEE;  Surgeon: Marchia Bond, MD;  Location: [facility];  Service: Orthopedics;  Laterality: Left;   THORACIC DISCECTOMY N/A 12/15/2012   Procedure: Thoracic ten-eleven Thoracic laminectomy;  Surgeon: Kristeen Miss, MD;  Location: [facility];  Service: Neurosurgery;  Laterality: N/A;  Thoracic ten-eleven Thoracic laminectomy   TUBAL LIGATION Bilateral 1991    Social History:  reports that she quit smoking about 16 years ago. Her smoking use included cigarettes. She has a  20.00 pack-year smoking history. She has never used smokeless tobacco. She reports current alcohol use. She reports that she does not use drugs.  Allergies: Allergies  Allergen Reactions   Duraprep [Antiseptic Products, Misc.] Itching and Rash    Irritation everywhere prep was used   Advil Allergy Sinus [Chlorpheniramine-Pse-Ibuprofen]     Nervousness    Betadine [Povidone Iodine] Other (See Comments)    Burning sensation.    Hydromorphone Hcl Other (See Comments)    Makes crazy   Morphine And Related Other (See Comments)    hallucinations    Family History:  Family History  Problem Relation Age of Onset   Diabetes Father    COPD Father    Cancer Maternal Grandfather        stomach     Current Outpatient Medications:    atenolol (TENORMIN) 25 MG tablet, TAKE 1 TABLET BY MOUTH  DAILY, Disp: 90 tablet, Rfl: 3   atorvastatin (LIPITOR) 40 MG tablet, TAKE 1 TABLET BY MOUTH  DAILY, Disp: 90 tablet, Rfl: 0   calcium citrate (CALCITRATE - DOSED IN MG ELEMENTAL CALCIUM) 950 (200 Ca) MG tablet, Take 200 mg of elemental calcium by mouth daily., Disp: , Rfl:    Carboxymethylcell-Hypromellose (GENTEAL OP), Apply to eye., Disp: , Rfl:    Carboxymethylcellulose Sodium (LUBRICANT EYE DROPS OP), Apply 2 drops to eye 4 (four) times daily as needed (dry eyes). , Disp: , Rfl:    citalopram (CELEXA) 40 MG tablet, TAKE 1 TABLET BY MOUTH  DAILY, Disp: 90 tablet, Rfl: 3   gemfibrozil (LOPID) 600 MG tablet, TAKE 1 TABLET BY MOUTH  TWICE DAILY BEFORE MEALS, Disp: 180 tablet, Rfl: 1   hydrocortisone 2.5 % cream, APPLY TO AFFECTED AREA(S)  TOPICALLY TWICE DAILY, Disp: 85.05 g, Rfl: 1   Lancets (ONETOUCH ULTRASOFT) lancets, Use daily, Disp: 100 each, Rfl: 6   levothyroxine (SYNTHROID) 137 MCG tablet, TAKE 1 TABLET BY MOUTH EVERY DAY, Disp: 90 tablet, Rfl: 1   lisinopril (ZESTRIL) 10 MG tablet, TAKE 1 TABLET BY MOUTH  DAILY, Disp: 90 tablet, Rfl: 3   meloxicam (MOBIC) 15 MG tablet, TAKE 1 TABLET BY MOUTH   DAILY, Disp: 90 tablet, Rfl: 3   metFORMIN (GLUCOPHAGE) 1000 MG tablet, TAKE 1 TABLET BY MOUTH  TWICE DAILY WITH MEALS, Disp: 180 tablet, Rfl: 1   Misc Natural Products (TURMERIC CURCUMIN) CAPS, Take by mouth., Disp: , Rfl:    Misc. Devices (BARIATRIC ROLLATOR) MISC, 1 Rollator with basket., Disp: 1 each, Rfl: 0   Multiple Vitamin (MULTIVITAMIN) tablet, Take 1 tablet by mouth daily., Disp: , Rfl:    Omega-3 Fatty Acids (FISH OIL) 1200 MG CAPS, Take 1 capsule by mouth daily. , Disp: , Rfl:    ONE TOUCH ULTRA TEST test strip, USE 1 DAILY AS DIRECTED, Disp: 100 each, Rfl: 3   pantoprazole (PROTONIX) 40 MG tablet,  TAKE 1 TABLET BY MOUTH  TWICE DAILY BEFORE MEALS, Disp: 180 tablet, Rfl: 0   pioglitazone (ACTOS) 15 MG tablet, TAKE 1 TABLET BY MOUTH  DAILY, Disp: 90 tablet, Rfl: 1   pregabalin (LYRICA) 75 MG capsule, TAKE 1 CAPSULE BY MOUTH  DAILY, Disp: 90 capsule, Rfl: 0   rOPINIRole (REQUIP) 1 MG tablet, TAKE 1 TABLET BY MOUTH AT  BEDTIME, Disp: 90 tablet, Rfl: 3   tiZANidine (ZANAFLEX) 4 MG tablet, TAKE 1 TABLET BY MOUTH 3  TIMES DAILY AS NEEDED, Disp: 180 tablet, Rfl: 1   tobramycin (TOBREX) 0.3 % ophthalmic solution, Place 1 drop into both eyes every 6 (six) hours. Day before injection, day of injection and day after injection, Disp: , Rfl:    traZODone (DESYREL) 100 MG tablet, TAKE 1 TO 2 TABLETS BY  MOUTH AT BEDTIME, Disp: 180 tablet, Rfl: 3   Vitamin D, Ergocalciferol, (DRISDOL) 1.25 MG (50000 UNIT) CAPS capsule, TAKE 1 CAPSULE BY MOUTH  EVERY 7 DAYS FOR 12 DOSES, Disp: 12 capsule, Rfl: 3   ALPRAZolam (XANAX) 0.5 MG tablet, Take 1 tablet (0.5 mg total) by mouth 2 (two) times daily as needed. for anxiety, Disp: 60 tablet, Rfl: 2   estradiol (ESTRACE) 0.1 MG/GM vaginal cream, PLACE 1 APPLICATORFUL  VAGINALLY 3 TIMES A WEEK., Disp: 170 g, Rfl: 3   HYDROcodone-acetaminophen (NORCO) 7.5-325 MG tablet, Take 1 tablet by mouth every 6 (six) hours as needed for moderate pain., Disp: 120 tablet, Rfl: 0    HYDROcodone-acetaminophen (NORCO) 7.5-325 MG tablet, Take 1 tablet by mouth every 6 (six) hours as needed for moderate pain., Disp: 120 tablet, Rfl: 0   HYDROcodone-acetaminophen (NORCO) 7.5-325 MG tablet, Take 1 tablet by mouth every 6 (six) hours as needed for moderate pain., Disp: 120 tablet, Rfl: 0  Review of Systems:  Constitutional: Denies fever, chills, diaphoresis, appetite change and fatigue.  HEENT: Denies photophobia, eye pain, redness, hearing loss, ear pain, congestion, sore throat, rhinorrhea, sneezing, mouth sores, trouble swallowing, neck pain, neck stiffness and tinnitus.   Respiratory: Denies SOB, DOE, cough, chest tightness,  and wheezing.   Cardiovascular: Denies chest pain, palpitations and leg swelling.  Gastrointestinal: Denies nausea, vomiting, abdominal pain, diarrhea, constipation, blood in stool and abdominal distention.  Genitourinary: Denies dysuria, urgency, frequency, hematuria, flank pain and difficulty urinating.  Endocrine: Denies: hot or cold intolerance, sweats, changes in hair or nails, polyuria, polydipsia. Musculoskeletal: Positive for myalgias, back pain, joint swelling, arthralgias and gait problem.  Skin: Denies pallor, rash and wound.  Neurological: Denies dizziness, seizures, syncope, weakness, light-headedness, numbness and headaches.  Hematological: Denies adenopathy. Easy bruising, personal or family bleeding history  Psychiatric/Behavioral: Denies suicidal ideation, mood changes, confusion, nervousness, sleep disturbance and agitation    Physical Exam: Vitals:   02/05/21 0917  BP: 130/80  Pulse: 88  Temp: 98.4 F (36.9 C)  TempSrc: Oral  SpO2: 96%  Weight: 188 lb 6.4 oz (85.5 kg)  Height: 5' (1.524 m)    Body mass index is 36.79 kg/m.   Constitutional: NAD, calm, comfortable, ambulates with a walker Eyes: PERRL, lids and conjunctivae normal ENMT: Mucous membranes are moist. Posterior pharynx clear of any exudate or lesions. Normal  dentition. Tympanic membrane is pearly white, no erythema or bulging. Neck: normal, supple, no masses, no thyromegaly Respiratory: clear to auscultation bilaterally, no wheezing, no crackles. Normal respiratory effort. No accessory muscle use.  Cardiovascular: Regular rate and rhythm, no murmurs / rubs / gallops. No extremity edema. 2+ pedal pulses. No carotid  bruits.  Abdomen: no tenderness, no masses palpated. No hepatosplenomegaly. Bowel sounds positive.  Skin: no rashes, lesions, ulcers. No induration Psychiatric: Normal judgment and insight. Alert and oriented x 3. Normal mood.    Subsequent Medicare wellness visit   1. Risk factors, based on past  M,S,F -cardiovascular disease risk factors include age, history of diabetes, hypertension, hyperlipidemia   2.  Physical activities: Very sedentary   3.  Depression/mood: History of depression   4.  Hearing: No perceived issues   5.  ADL's: Independent in all ADLs   6.  Fall risk: High fall risk   7.  Home safety: No problems identified   8.  Height weight, and visual acuity: height and weight as above, vision:  Vision Screening   Right eye Left eye Both eyes  Without correction 20/32 20/25 20/25   With correction         10. Lab orders based on risk factors: Laboratory update will be reviewed   11. Referral : None today   12. Care plan: Follow-up with me in 3 months   13. Cognitive assessment: No cognitive impairment   14. Screening: Patient provided with a written and personalized 5-10 year screening schedule in the AVS. yes   15. Provider List Update: PCP, neurosurgeon Dr. Ellene Route  16. Advance Directives: Full code   17. Opioids: Patient is on chronic opioids. She has a signed pain contract with me. Has not displayed any signs of an opioid-use disorder.   [place] Office Visit from 02/05/2021 in [place] at [facility]  PHQ-9 Total Score 10       Fall Risk 12/28/2017 01/13/2018 08/15/2018  12/05/2019 02/05/2021  Falls in the past year? - 0 0 1 1  Was there an injury with Fall? - 0 0 1 0  Was there an injury with Fall? - - - Bruise on leg -  Fall Risk Category Calculator - 0 0 2 2  Fall Risk Category - Low Low Moderate Moderate  Patient Fall Risk Level High fall risk - - - -  Fall risk Follow up - - - - Falls evaluation completed      Impression and Plan:  Encounter for preventive health examination -Recommend routine eye and dental care. -Immunizations: All immunizations are up-to-date -Healthy lifestyle discussed in detail. -Labs to be updated today. -Colon cancer screening: Cologuard March/2022 -Breast cancer screening: 11/22 -Cervical cancer screening: Overdue, will set up appointment with GYN -Lung cancer screening: Not applicable -Prostate cancer screening: Not applicable -DEXA: [id number]  Anxiety  - Plan: ALPRAZolam (XANAX) 0.5 MG tablet  Spinal stenosis, lumbar region, with neurogenic claudication  -PDMP reviewed, overdose risk score is 310, she has 1 red flag for  number of pharmacies.  All prescriptions have been by myself. -Refill Norco 7.5/325 mg to take 1 tablet every 6 hours as needed for pain for a total of 120 tablets a month x3 months.  Morbid obesity ([place]) -Discussed healthy lifestyle, including increased physical activity and better food choices to promote weight loss.  Essential hypertension  - Plan: CBC with Differential/Platelet, Comprehensive metabolic panel -Fair blood pressure control.    Dyslipidemia  - Plan: Lipid panel  Acquired hypothyroidism  - Plan: TSH  OSA (obstructive sleep apnea) -Noted  Moderate episode of recurrent major depressive disorder ([facility]) -Mood appears stable today.  Type 2 diabetes mellitus with other specified complication, without long-term current use of insulin ([facility]) - Plan: Hemoglobin A1c -Check A1c today.    Patient Instructions  -Nice  seeing you today!!  -Lab work today; will notify you once  results are available.  -Remember your COVID booster.  -Schedule follow up in 3 months. Can be virtual.      Lelon Frohlich, MD [facility]

## 2021-02-06 ENCOUNTER — Other Ambulatory Visit: Payer: Self-pay | Admitting: Internal Medicine

## 2021-02-06 DIAGNOSIS — E039 Hypothyroidism, unspecified: Secondary | ICD-10-CM

## 2021-02-06 MED ORDER — LEVOTHYROXINE SODIUM 125 MCG PO TABS: 125.0000 ug | ORAL_TABLET | Freq: Every day | ORAL | 1 refills | Status: DC

## 2021-02-11 ENCOUNTER — Other Ambulatory Visit: Payer: Self-pay | Admitting: Internal Medicine

## 2021-02-11 DIAGNOSIS — E039 Hypothyroidism, unspecified: Secondary | ICD-10-CM

## 2021-02-17 ENCOUNTER — Telehealth: Payer: Self-pay | Admitting: Pharmacist

## 2021-02-17 NOTE — Chronic Care Management (AMB) (Signed)
Chronic Care Management Pharmacy Assistant   Name: Katie Booker  MRN: [id number] DOB: [birth year]  Reason for Encounter: Disease State / Hypertension Assessment Call    Conditions to be addressed/monitored: HTN  Recent office visits:  02/05/21 Philip Aspen, Limmie Patricia, MD - Patient presented for Preventative health examination and other concerns. Stopped Calcium Carb-Cholecalciferol, Stopped Docusate Sodium, Stopped Loratadine  11/06/20 Philip Aspen, Limmie Patricia, MD - Patient presented for Diabetes mellitus and other concerns. No medication changes.  Recent consult visits:  None   Hospital visits:  None in previous 6 months  Medications: Outpatient Encounter Medications as of 02/17/2021  Medication Sig   ALPRAZolam (XANAX) 0.5 MG tablet Take 1 tablet (0.5 mg total) by mouth 2 (two) times daily as needed. for anxiety   atenolol (TENORMIN) 25 MG tablet TAKE 1 TABLET BY MOUTH  DAILY   atorvastatin (LIPITOR) 40 MG tablet TAKE 1 TABLET BY MOUTH  DAILY   calcium citrate (CALCITRATE - DOSED IN MG ELEMENTAL CALCIUM) 950 (200 Ca) MG tablet Take 200 mg of elemental calcium by mouth daily.   Carboxymethylcell-Hypromellose (GENTEAL OP) Apply to eye.   Carboxymethylcellulose Sodium (LUBRICANT EYE DROPS OP) Apply 2 drops to eye 4 (four) times daily as needed (dry eyes).    citalopram (CELEXA) 40 MG tablet TAKE 1 TABLET BY MOUTH  DAILY   estradiol (ESTRACE) 0.1 MG/GM vaginal cream PLACE 1 APPLICATORFUL  VAGINALLY 3 TIMES A WEEK.   gemfibrozil (LOPID) 600 MG tablet TAKE 1 TABLET BY MOUTH  TWICE DAILY BEFORE MEALS   HYDROcodone-acetaminophen (NORCO) 7.5-325 MG tablet Take 1 tablet by mouth every 6 (six) hours as needed for moderate pain.   HYDROcodone-acetaminophen (NORCO) 7.5-325 MG tablet Take 1 tablet by mouth every 6 (six) hours as needed for moderate pain.   HYDROcodone-acetaminophen (NORCO) 7.5-325 MG tablet Take 1 tablet by mouth every 6 (six) hours as needed for moderate pain.    hydrocortisone 2.5 % cream APPLY TO AFFECTED AREA(S)  TOPICALLY TWICE DAILY   Lancets (ONETOUCH ULTRASOFT) lancets Use daily   levothyroxine (SYNTHROID) 125 MCG tablet Take 1 tablet (125 mcg total) by mouth daily.   lisinopril (ZESTRIL) 10 MG tablet TAKE 1 TABLET BY MOUTH  DAILY   meloxicam (MOBIC) 15 MG tablet TAKE 1 TABLET BY MOUTH  DAILY   metFORMIN (GLUCOPHAGE) 1000 MG tablet TAKE 1 TABLET BY MOUTH  TWICE DAILY WITH MEALS   Misc Natural Products (TURMERIC CURCUMIN) CAPS Take by mouth.   Misc. Devices (BARIATRIC ROLLATOR) MISC 1 Rollator with basket.   Multiple Vitamin (MULTIVITAMIN) tablet Take 1 tablet by mouth daily.   Omega-3 Fatty Acids (FISH OIL) 1200 MG CAPS Take 1 capsule by mouth daily.    ONE TOUCH ULTRA TEST test strip USE 1 DAILY AS DIRECTED   pantoprazole (PROTONIX) 40 MG tablet TAKE 1 TABLET BY MOUTH  TWICE DAILY BEFORE MEALS   pioglitazone (ACTOS) 15 MG tablet TAKE 1 TABLET BY MOUTH  DAILY   pregabalin (LYRICA) 75 MG capsule TAKE 1 CAPSULE BY MOUTH  DAILY   rOPINIRole (REQUIP) 1 MG tablet TAKE 1 TABLET BY MOUTH AT  BEDTIME   tiZANidine (ZANAFLEX) 4 MG tablet TAKE 1 TABLET BY MOUTH 3  TIMES DAILY AS NEEDED   tobramycin (TOBREX) 0.3 % ophthalmic solution Place 1 drop into both eyes every 6 (six) hours. Day before injection, day of injection and day after injection   traZODone (DESYREL) 100 MG tablet TAKE 1 TO 2 TABLETS BY  MOUTH AT BEDTIME  Vitamin D, Ergocalciferol, (DRISDOL) 1.25 MG (50000 UNIT) CAPS capsule TAKE 1 CAPSULE BY MOUTH  EVERY 7 DAYS FOR 12 DOSES   No facility-administered encounter medications on file as of 02/17/2021.  Reviewed chart prior to disease state call. Spoke with patient regarding BP  Recent Office Vitals: BP Readings from Last 3 Encounters:  02/05/21 130/80  11/06/20 120/70  03/27/20 120/80   Pulse Readings from Last 3 Encounters:  02/05/21 88  11/06/20 76  03/27/20 67    Wt Readings from Last 3 Encounters:  02/05/21 188 lb 6.4 oz (85.5  kg)  11/06/20 189 lb 11.2 oz (86 kg)  09/19/20 186 lb (84.4 kg)     Kidney Function Lab Results  Component Value Date/Time   CREATININE 1.15 02/05/2021 09:59 AM   CREATININE 0.95 05/16/2020 10:07 AM   CREATININE 1.25 (H) 12/05/2019 09:41 AM   GFR 46.79 (L) 02/05/2021 09:59 AM   GFRNONAA >60 12/28/2017 05:24 AM   GFRAA >60 12/28/2017 05:24 AM    BMP Latest Ref Rng & Units 02/05/2021 05/16/2020 12/05/2019  Glucose 70 - 99 mg/dL 87 98 83  BUN 6 - 23 mg/dL 17(B) 56(P) 01(I)  Creatinine 0.40 - 1.20 mg/dL 1.03 0.13 1.43(O)  BUN/Creat Ratio 6 - 22 (calc) - - 25(H)  Sodium 135 - 145 mEq/L 140 141 138  Potassium 3.5 - 5.1 mEq/L 4.9 4.1 5.6(H)  Chloride 96 - 112 mEq/L 103 101 103  CO2 19 - 32 mEq/L 28 28 25   Calcium 8.4 - 10.5 mg/dL 9.8 9.8 9.4    Current antihypertensive regimen:  Atenolol 25 mg 1 tablet daily - in AM Lisinopril 10 mg 1 tablet daily   How often are you checking your Blood Pressure? infrequently Current home BP readings: Patient reports she has just gotten a cuff has not started checking regularly yet will start. Last office reading 02/05/21 130/80. She reports some headaches and dizziness at times but she thinks its due to her sinus issues. What recent interventions/DTPs have been made by any provider to improve Blood Pressure control since last CPP Visit: Patient reports no changes Any recent hospitalizations or ED visits since last visit with CPP? No What diet changes have been made to improve Blood Pressure Control?  Patient reports she is eating more carb's than she should, does not eat much red meat or protein What exercise is being done to improve your Blood Pressure Control?  Patient reports she uses a walker to get around, does her own shopping when he can and does housework and walks to trash cans and mailbox.  Adherence Review: Is the patient currently on ACE/ARB medication? Yes Does the patient have >5 day gap between last estimated fill dates?  No  Notes: Patient reports she had some concerns about her blood work results being out of range. Advised her that her PCP commented that they were stable so not much cause for concern.  Patient reports she will begin eating more red meat and iron rich foods for the anemia portion as she takes a very low dose iron supplement. Advised her that that should be ok and if there were any recommendations from the pharmacist prior to her appointment next month I would reach back out to her to let her know. She was in agreement.  Care Gaps: BP- 130/80 (02/05/21) Foot Exam - Overdue AWV - 12/22 CCM - 1/23 Lab Results  Component Value Date   HGBA1C 6.1 02/05/2021    Star Rating Drugs: Pioglitazone (Actos) 15 mg -  Last filled 02/09/2021 90 DS at [facility] Metformin (Glucophage) 1000 mg - Last filled 09-25-2020 90 DS at [facility] (Verified as accurate) Lisinopril (Zestril) 10 mg - Last filled 12/27/2020 90 DS at [facility] Atorvastatin (Lipitor) 40 mg - Last filled 02/09/2021 90 DS at [facility]   Pamala Duffel CMA Clinical Pharmacist Assistant [phone]

## 2021-02-20 ENCOUNTER — Other Ambulatory Visit: Payer: Self-pay | Admitting: Internal Medicine

## 2021-02-21 DIAGNOSIS — I10 Essential (primary) hypertension: Secondary | ICD-10-CM | POA: Diagnosis not present

## 2021-02-21 DIAGNOSIS — G4733 Obstructive sleep apnea (adult) (pediatric): Secondary | ICD-10-CM | POA: Diagnosis not present

## 2021-02-21 DIAGNOSIS — F32A Depression, unspecified: Secondary | ICD-10-CM | POA: Diagnosis not present

## 2021-03-06 IMAGING — MG DIGITAL SCREENING BILAT W/ TOMO W/ CAD
6 of 12 series · 6 of 36 positions shown · non-contrast
Comparison: Previous exam(s).

CLINICAL DATA: Screening.

EXAM:
DIGITAL SCREENING BILATERAL MAMMOGRAM WITH TOMO AND CAD

[R CC synth-2D]
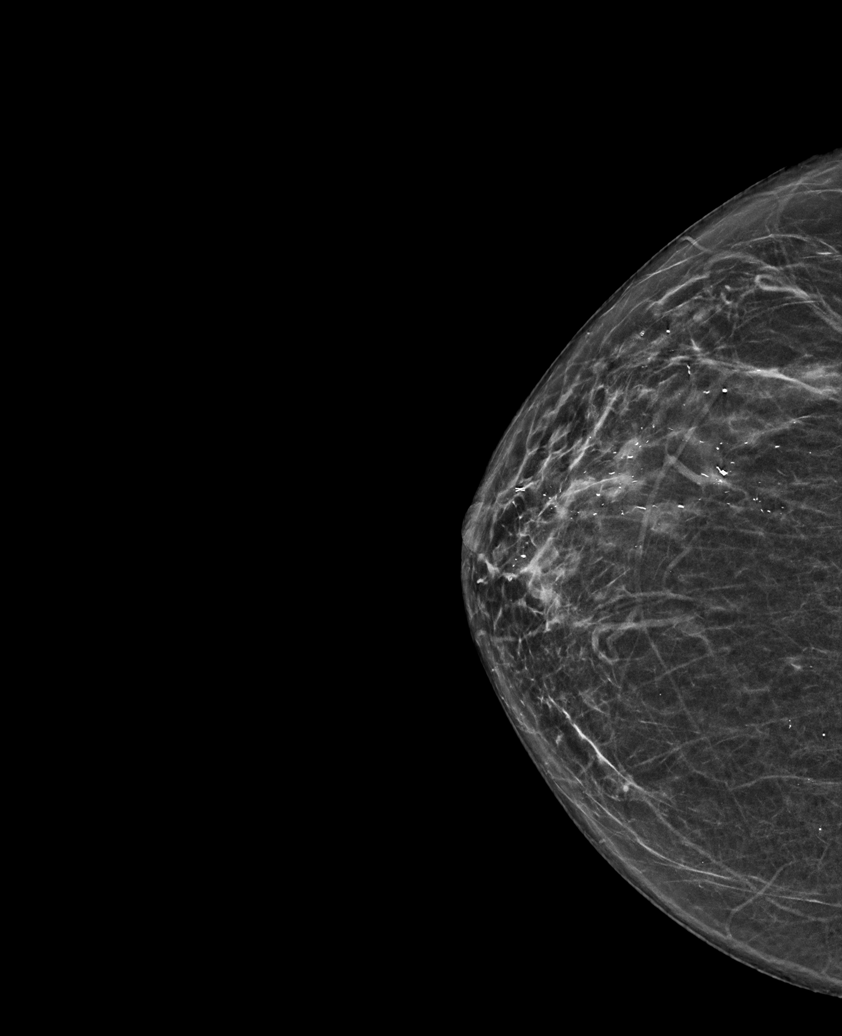

[R MLO synth-2D (1 of 2)]
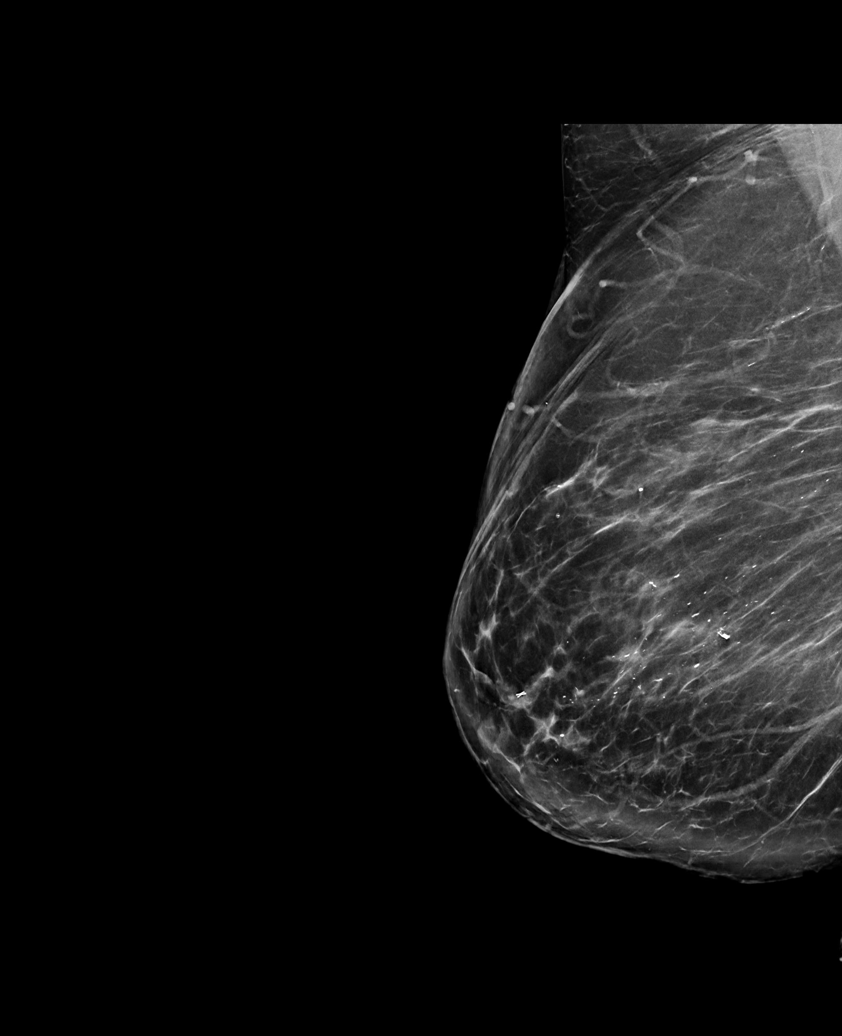

[L MLO synth-2D (1 of 2)]
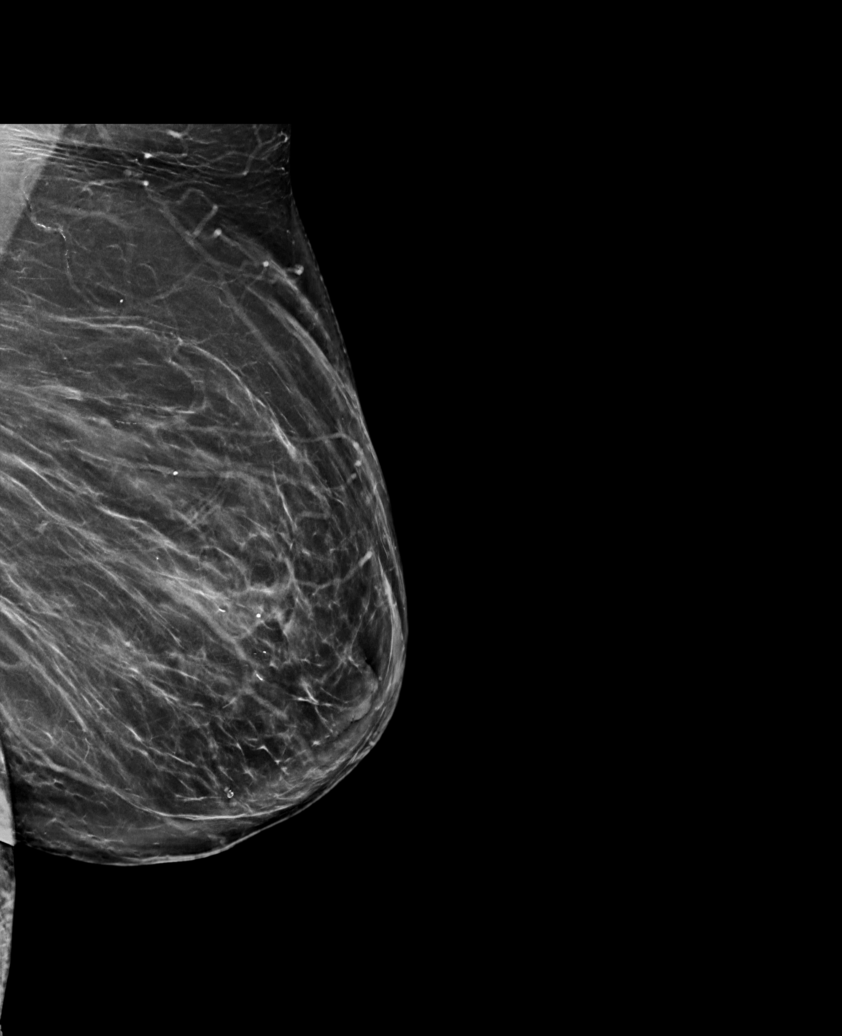

[L CC synth-2D]
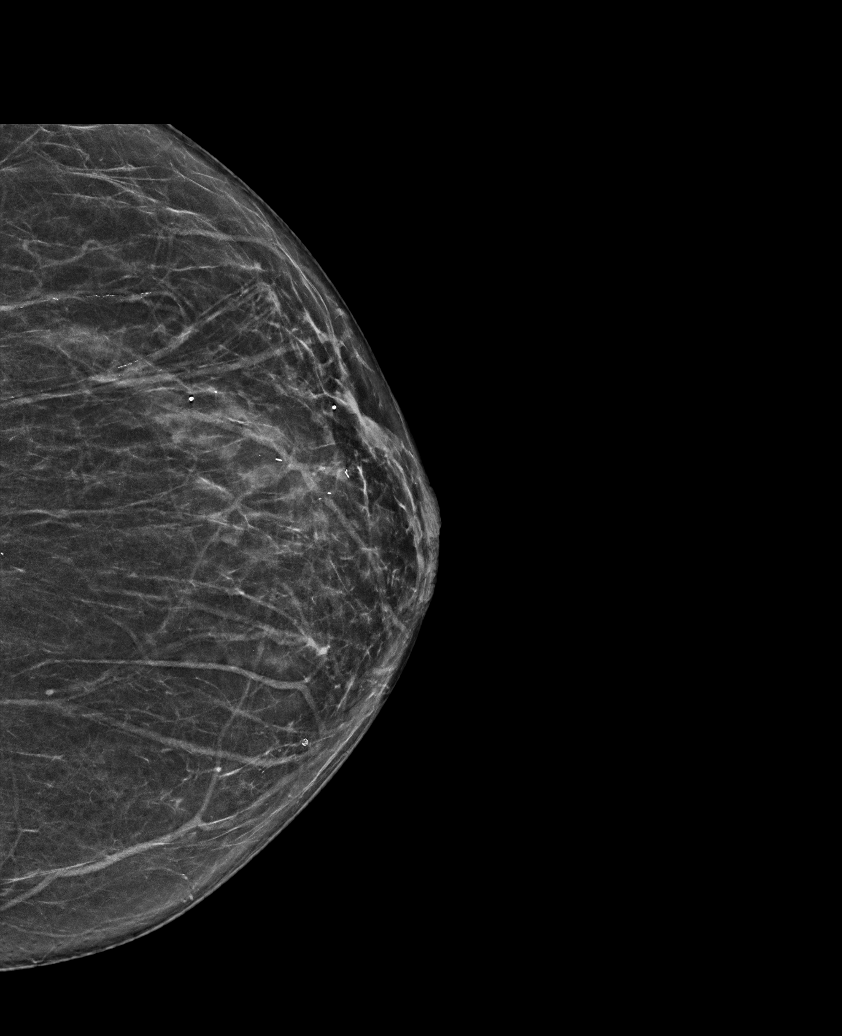

[R MLO synth-2D (2 of 2)]
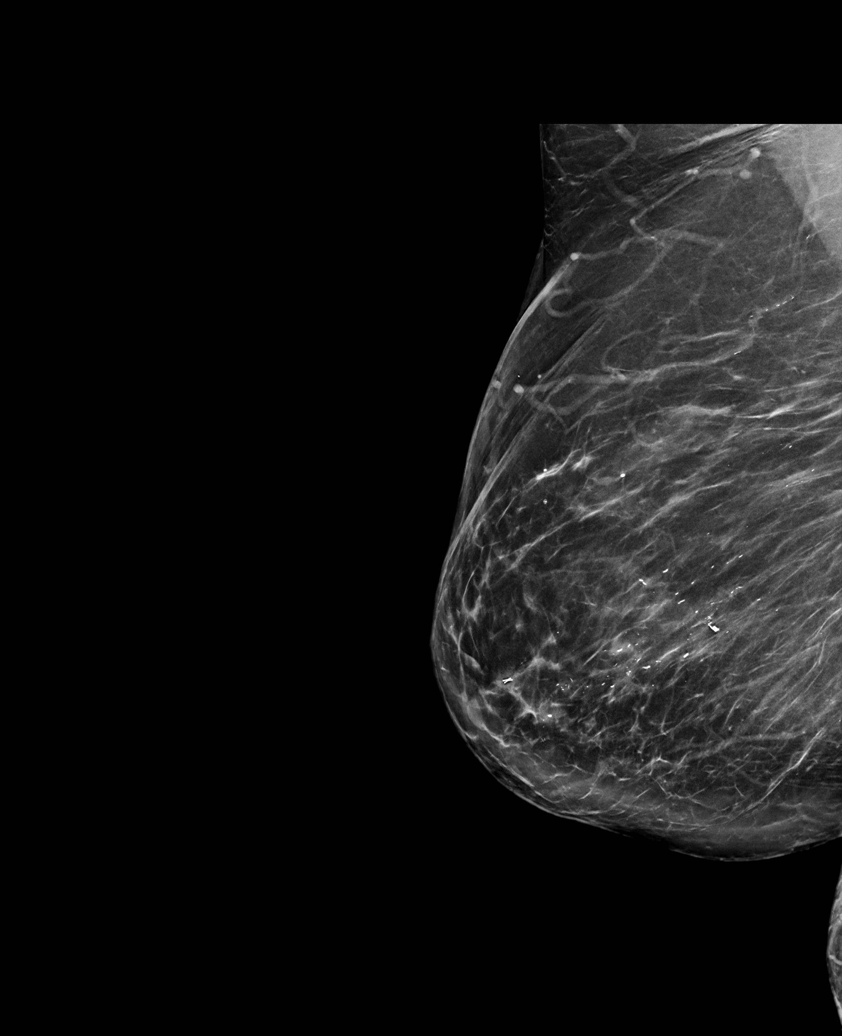

[L MLO synth-2D (2 of 2)]
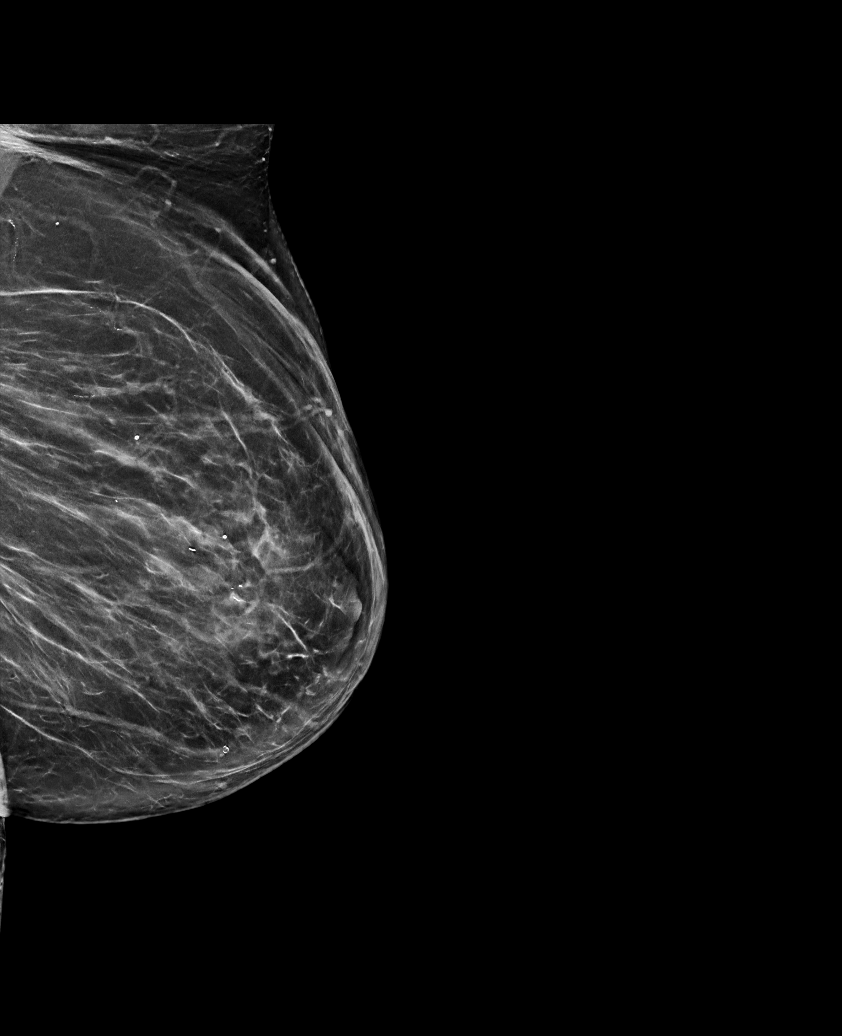

[6 of 36 positions shown; findings below may reference images not displayed]

ACR Breast Density Category b: There are scattered areas of
fibroglandular density.
FINDINGS: There are no findings suspicious for malignancy. Images were
processed with CAD.
IMPRESSION: No mammographic evidence of malignancy. A result letter of this
screening mammogram will be mailed directly to the patient.

RECOMMENDATION:
Screening mammogram in one year. (Code:[id number])

BI-RADS CATEGORY  1: Negative.

## 2021-03-12 ENCOUNTER — Other Ambulatory Visit: Payer: Self-pay | Admitting: Internal Medicine

## 2021-03-12 DIAGNOSIS — M48062 Spinal stenosis, lumbar region with neurogenic claudication: Secondary | ICD-10-CM

## 2021-03-19 ENCOUNTER — Telehealth: Payer: Self-pay | Admitting: Pharmacist

## 2021-03-19 NOTE — Chronic Care Management (AMB) (Signed)
Chronic Care Management Pharmacy Assistant   Name: Wilbert Schouten  MRN: [id number] DOB: 1945/04/19  03/19/21 APPOINTMENT REMINDER   Called Patient No answer, left message of appointment on 03/23/21 at 2:30 via telephone visit with Gaylord Shih, Pharm D.   Notified to have all medications, supplements, blood pressure and/or blood sugar logs available during appointment and to return call if need to reschedule.    Care Gaps: BP- 130/80 (02/05/21) Foot Exam - Overdue AWV - 12/22 CCM - 1/23 Lab Results  Component Value Date   HGBA1C 6.1 02/05/2021     Star Rating Drug: Pioglitazone (Actos) 15 mg - Last filled 02/09/2021 90 DS at [facility] Metformin (Glucophage) 1000 mg - Last filled 09-25-2020 90 DS at [facility] (Verified as accurate) Lisinopril (Zestril) 10 mg - Last filled 12/27/2020 90 DS at [facility] Atorvastatin (Lipitor) 40 mg - Last filled 02/09/2021 90 DS at [facility]  Any gaps in medications fill history? Metformin (Glucophage) 1000 mg - Last filled 09-25-2020 90 DS at [facility] (Verified as accurate)      Medications: Outpatient Encounter Medications as of 03/19/2021  Medication Sig   ALPRAZolam (XANAX) 0.5 MG tablet Take 1 tablet (0.5 mg total) by mouth 2 (two) times daily as needed. for anxiety   atenolol (TENORMIN) 25 MG tablet TAKE 1 TABLET BY MOUTH  DAILY   atorvastatin (LIPITOR) 40 MG tablet TAKE 1 TABLET BY MOUTH  DAILY   calcium citrate (CALCITRATE - DOSED IN MG ELEMENTAL CALCIUM) 950 (200 Ca) MG tablet Take 200 mg of elemental calcium by mouth daily.   Carboxymethylcell-Hypromellose (GENTEAL OP) Apply to eye.   Carboxymethylcellulose Sodium (LUBRICANT EYE DROPS OP) Apply 2 drops to eye 4 (four) times daily as needed (dry eyes).    citalopram (CELEXA) 40 MG tablet TAKE 1 TABLET BY MOUTH  DAILY   estradiol (ESTRACE) 0.1 MG/GM vaginal cream PLACE 1 APPLICATORFUL  VAGINALLY 3 TIMES A WEEK.   gemfibrozil (LOPID) 600 MG tablet TAKE 1 TABLET BY MOUTH  TWICE DAILY BEFORE MEALS    HYDROcodone-acetaminophen (NORCO) 7.5-325 MG tablet Take 1 tablet by mouth every 6 (six) hours as needed for moderate pain.   HYDROcodone-acetaminophen (NORCO) 7.5-325 MG tablet Take 1 tablet by mouth every 6 (six) hours as needed for moderate pain.   HYDROcodone-acetaminophen (NORCO) 7.5-325 MG tablet Take 1 tablet by mouth every 6 (six) hours as needed for moderate pain.   hydrocortisone 2.5 % cream APPLY TO AFFECTED AREA(S)  TOPICALLY TWICE DAILY   Lancets (ONETOUCH ULTRASOFT) lancets Use daily   levothyroxine (SYNTHROID) 125 MCG tablet Take 1 tablet (125 mcg total) by mouth daily.   lisinopril (ZESTRIL) 10 MG tablet TAKE 1 TABLET BY MOUTH  DAILY   meloxicam (MOBIC) 15 MG tablet TAKE 1 TABLET BY MOUTH  DAILY   metFORMIN (GLUCOPHAGE) 1000 MG tablet TAKE 1 TABLET BY MOUTH  TWICE DAILY WITH MEALS   Misc Natural Products (TURMERIC CURCUMIN) CAPS Take by mouth.   Misc. Devices (BARIATRIC ROLLATOR) MISC 1 Rollator with basket.   Multiple Vitamin (MULTIVITAMIN) tablet Take 1 tablet by mouth daily.   Omega-3 Fatty Acids (FISH OIL) 1200 MG CAPS Take 1 capsule by mouth daily.    ONE TOUCH ULTRA TEST test strip USE 1 DAILY AS DIRECTED   pantoprazole (PROTONIX) 40 MG tablet TAKE 1 TABLET BY MOUTH  TWICE DAILY BEFORE MEALS   pioglitazone (ACTOS) 15 MG tablet TAKE 1 TABLET BY MOUTH  DAILY   pregabalin (LYRICA) 75 MG capsule TAKE 1 CAPSULE BY  MOUTH  DAILY   rOPINIRole (REQUIP) 1 MG tablet TAKE 1 TABLET BY MOUTH AT  BEDTIME   tiZANidine (ZANAFLEX) 4 MG tablet TAKE 1 TABLET BY MOUTH 3  TIMES DAILY AS NEEDED   tobramycin (TOBREX) 0.3 % ophthalmic solution Place 1 drop into both eyes every 6 (six) hours. Day before injection, day of injection and day after injection   traZODone (DESYREL) 100 MG tablet TAKE 1 TO 2 TABLETS BY  MOUTH AT BEDTIME   Vitamin D, Ergocalciferol, (DRISDOL) 1.25 MG (50000 UNIT) CAPS capsule TAKE 1 CAPSULE BY MOUTH  EVERY 7 DAYS FOR 12 DOSES   No facility-administered encounter  medications on file as of 03/19/2021.       Pamala Duffel CMA Clinical Pharmacist Assistant [phone]

## 2021-03-23 ENCOUNTER — Ambulatory Visit (INDEPENDENT_AMBULATORY_CARE_PROVIDER_SITE_OTHER): Payer: Medicare Other | Admitting: Pharmacist

## 2021-03-23 DIAGNOSIS — I10 Essential (primary) hypertension: Secondary | ICD-10-CM

## 2021-03-23 DIAGNOSIS — E785 Hyperlipidemia, unspecified: Secondary | ICD-10-CM

## 2021-03-23 NOTE — Progress Notes (Signed)
Chronic Care Management Pharmacy Note  03/24/2021 Name:  Katie Booker MRN:  [id number] DOB:  1945-08-09  Summary: Pt is still in a lot of pain and is considering a referral to pain management   Recommendations/Changes made from today's visit: -Recommend stopping gemfibrozil due to contraindication with statin therapy and starting Vascepa -Recommend tapering trazodone as pt reports this is ineffective -Recommended determining coverage for therapist from insurance -Recommended for pt to start checking BP at home weekly   Plan: Follow up after discussion with PCP BP assessment in 2-3 months  Subjective: Katie Booker is an 76 y.o. year old female who is a primary patient of Isaac Bliss, Rayford Halsted, MD.  The CCM team was consulted for assistance with disease management and care coordination needs.    Engaged with patient by telephone for follow up visit in response to provider referral for pharmacy case management and/or care coordination services.   Consent to Services:  The patient was given information about Chronic Care Management services, agreed to services, and gave verbal consent prior to initiation of services.  Please see initial visit note for detailed documentation.   Patient Care Team: Isaac Bliss, Rayford Halsted, MD as PCP - General (Internal Medicine) Viona Gilmore, [facility] as Pharmacist (Pharmacist)  Recent office visits: 02/05/21 Isaac Bliss, Rayford Halsted, MD - Patient presented for Preventative health examination and other concerns. Stopped Calcium Carb-Cholecalciferol, Stopped Docusate Sodium, Stopped Loratadine   11/06/20 Isaac Bliss, Rayford Halsted, MD - Patient presented for Diabetes mellitus and other concerns. No medication changes. 137 mcg daily. Influenza vaccine administered.  09-19-2020 Isaac Bliss, Rayford Halsted, MD - Patient presented via tele-health for Chronic midline low back pain without sciatica and other concerns. No medication  changes.  Recent consult visits: 03/27/20 Kara Mead, MD (pulmonary): Patient presented for OSA consult. Recommended nasal mask. Plan to set her up with desensitization session.  11/28/19 Sherlynn Stalls (ophthalmology): Patient presented for bevacizumab injection. Unable to access notes.   Hospital visits: None in previous 6 months  Objective:  Lab Results  Component Value Date   CREATININE 1.15 02/05/2021   BUN 25 (H) 02/05/2021   GFR 46.79 (L) 02/05/2021   GFRNONAA >60 12/28/2017   GFRAA >60 12/28/2017   NA 140 02/05/2021   K 4.9 02/05/2021   CALCIUM 9.8 02/05/2021   CO2 28 02/05/2021   GLUCOSE 87 02/05/2021    Lab Results  Component Value Date/Time   HGBA1C 6.1 02/05/2021 09:59 AM   HGBA1C 5.7 (A) 11/06/2020 02:50 PM   HGBA1C 6.0 (A) 12/05/2019 08:37 AM   HGBA1C 6.2 08/15/2018 09:25 AM   GFR 46.79 (L) 02/05/2021 09:59 AM   GFR 59.15 (L) 05/16/2020 10:07 AM   MICROALBUR <0.7 04/19/2016 08:42 AM   MICROALBUR 1.3 01/19/2016 01:43 PM    Last diabetic Eye exam:  Lab Results  Component Value Date/Time   HMDIABEYEEXA No Retinopathy 12/14/2017 12:00 AM    Last diabetic Foot exam: No results found for: HMDIABFOOTEX   Lab Results  Component Value Date   CHOL 158 02/05/2021   HDL 69.10 02/05/2021   LDLCALC 59 02/05/2021   LDLDIRECT 70.0 08/15/2018   TRIG 148.0 02/05/2021   CHOLHDL 2 02/05/2021    Hepatic Function Latest Ref Rng & Units 02/05/2021 12/05/2019 08/15/2018  Total Protein 6.0 - 8.3 g/dL 6.7 6.3 6.3  Albumin 3.5 - 5.2 g/dL 4.2 - 4.3  AST 0 - 37 U/L _0 ALT 0 - 35 U/L 17 21  19  Alk Phosphatase 39 - 117 U/L 63 - 69  Total Bilirubin 0.2 - 1.2 mg/dL 0.3 0.3 0.3  Bilirubin, Direct 0.0 - 0.3 mg/dL - - -    Lab Results  Component Value Date/Time   TSH 0.34 (L) 02/05/2021 09:59 AM   TSH 0.90 05/16/2020 10:07 AM    CBC Latest Ref Rng & Units 02/05/2021 12/05/2019 08/15/2018  WBC 4.0 - 10.5 K/uL 4.5 7.4 10.5  Hemoglobin 12.0 - 15.0 g/dL 11.7(L) 11.8  12.4  Hematocrit 36.0 - 46.0 % 35.6(L) 35.8 36.8  Platelets 150.0 - 400.0 K/uL 175.0 189 202.0    Lab Results  Component Value Date/Time   VD25OH 46.32 02/05/2021 09:59 AM   VD25OH 39 12/05/2019 09:41 AM   VD25OH 63.02 08/15/2018 09:25 AM    Clinical ASCVD: No  The 10-year ASCVD risk score (Arnett DK, et al., 2019) is: 37.1%   Values used to calculate the score:     Age: 76 years     Sex: Female     Is Non-Hispanic African American: No     Diabetic: Yes     Tobacco smoker: No     Systolic Blood Pressure: 093 mmHg     Is BP treated: Yes     HDL Cholesterol: 69.1 mg/dL     Total Cholesterol: 158 mg/dL    Depression screen [facility] 2/9 02/05/2021 09/19/2020 03/26/2020  Decreased Interest 0 1 1  Down, Depressed, Hopeless 0 2 3  PHQ - 2 Score 0 3 4  Altered sleeping _0 Tired, decreased energy _1 Change in appetite 3 0 0  Feeling bad or failure about yourself  1 3 0  Trouble concentrating 0 3 2  Moving slowly or fidgety/restless 0 1 0  Suicidal thoughts 0 0 -  PHQ-9 Score _2 Difficult doing work/chores Not difficult at all Very difficult Not difficult at all  Some recent data might be hidden      Social History   Tobacco Use  Smoking Status Former   Packs/day: 1.00   Years: 20.00   Pack years: 20.00   Types: Cigarettes   Quit date: 02/23/2004   Years since quitting: 17.0  Smokeless Tobacco Never  Tobacco Comments   quit 2007   BP Readings from Last 3 Encounters:  02/05/21 130/80  11/06/20 120/70  03/27/20 120/80   Pulse Readings from Last 3 Encounters:  02/05/21 88  11/06/20 76  03/27/20 67   Wt Readings from Last 3 Encounters:  02/05/21 188 lb 6.4 oz (85.5 kg)  11/06/20 189 lb 11.2 oz (86 kg)  09/19/20 186 lb (84.4 kg)   BMI Readings from Last 3 Encounters:  02/05/21 36.79 kg/m  11/06/20 35.84 kg/m  09/19/20 35.14 kg/m    Assessment/Interventions: Review of patient past medical history, allergies, medications, health status, including  review of consultants reports, laboratory and other test data, was performed as part of comprehensive evaluation and provision of chronic care management services.   SDOH:  (Social Determinants of Health) assessments and interventions performed: No   CCM Care Plan  Allergies  Allergen Reactions   Duraprep [Antiseptic Products, Misc.] Itching and Rash    Irritation everywhere prep was used   Advil Allergy Sinus [Chlorpheniramine-Pse-Ibuprofen]     Nervousness    Betadine [Povidone Iodine] Other (See Comments)    Burning sensation.    Hydromorphone Hcl Other (See Comments)    Makes crazy   Morphine And Related Other (See  Comments)    hallucinations    Medications Reviewed Today     Reviewed by Lamarr Lulas, CMA (Certified Medical Assistant) on 02/05/21 at [place] List Status: <None>   Medication Order Taking? Sig Documenting Provider Last Dose Status Informant  ALPRAZolam (XANAX) 0.5 MG tablet [id number] Yes Take 1 tablet (0.5 mg total) by mouth 2 (two) times daily as needed. for anxiety Isaac Bliss, Rayford Halsted, MD Taking Active   atenolol (TENORMIN) 25 MG tablet [id number] Yes TAKE 1 TABLET BY MOUTH  DAILY Isaac Bliss, Rayford Halsted, MD Taking Active   atorvastatin (LIPITOR) 40 MG tablet [id number] Yes TAKE 1 TABLET BY MOUTH  DAILY Isaac Bliss, Rayford Halsted, MD Taking Active   calcium citrate (CALCITRATE - DOSED IN MG ELEMENTAL CALCIUM) 950 (200 Ca) MG tablet [id number] Yes Take 200 mg of elemental calcium by mouth daily. [provider] Taking Active   Carboxymethylcell-Hypromellose [facility] OP) [id number] Yes Apply to eye. [provider] Taking Active   Carboxymethylcellulose Sodium (LUBRICANT EYE DROPS OP) [id number] Yes Apply 2 drops to eye 4 (four) times daily as needed (dry eyes).  [provider] Taking Active Self  citalopram (CELEXA) 40 MG tablet [id number] Yes TAKE 1 TABLET BY MOUTH  DAILY Isaac Bliss, Rayford Halsted, MD Taking Active   estradiol  (ESTRACE) 0.1 MG/GM vaginal cream [id number] No PLACE 1 APPLICATORFUL  VAGINALLY 3 TIMES A WEEK. Isaac Bliss, Rayford Halsted, MD Unknown Active   gemfibrozil (LOPID) 600 MG tablet [id number] Yes TAKE 1 TABLET BY MOUTH  TWICE DAILY BEFORE MEALS Isaac Bliss, Rayford Halsted, MD Taking Active   HYDROcodone-acetaminophen [facility]) 7.5-325 MG tablet [id number] Yes Take 1 tablet by mouth every 6 (six) hours as needed for moderate pain. Isaac Bliss, Rayford Halsted, MD Taking Active   HYDROcodone-acetaminophen [facility]) 7.5-325 MG tablet [id number] Yes Take 1 tablet by mouth every 6 (six) hours as needed for moderate pain. Isaac Bliss, Rayford Halsted, MD Taking Active   HYDROcodone-acetaminophen [facility]) 7.5-325 MG tablet [id number] Yes Take 1 tablet by mouth every 6 (six) hours as needed for moderate pain. Isaac Bliss, Rayford Halsted, MD Taking Active   hydrocortisone 2.5 % cream [id number] Yes APPLY TO AFFECTED AREA(S)  TOPICALLY TWICE DAILY Isaac Bliss, Rayford Halsted, MD Taking Active   Lancets [facility] ULTRASOFT) lancets [id number] Yes Use daily Marletta Lor, MD Taking Active Self  levothyroxine (SYNTHROID) 137 MCG tablet [id number] Yes TAKE 1 TABLET BY MOUTH EVERY DAY Isaac Bliss, Rayford Halsted, MD Taking Active   lisinopril (ZESTRIL) 10 MG tablet [id number] Yes TAKE 1 TABLET BY MOUTH  DAILY Isaac Bliss, Rayford Halsted, MD Taking Active   meloxicam (MOBIC) 15 MG tablet [id number] Yes TAKE 1 TABLET BY MOUTH  DAILY Isaac Bliss, Rayford Halsted, MD Taking Active   metFORMIN (GLUCOPHAGE) 1000 MG tablet [id number] Yes TAKE 1 TABLET BY MOUTH  TWICE DAILY WITH MEALS Isaac Bliss, Rayford Halsted, MD Taking Active   Misc Natural Products ([place]) [place] [id number] Yes Take by mouth. [provider] Taking Active   Misc. Devices (BARIATRIC ROLLATOR) MISC [id number] Yes 1 Rollator with basket. Isaac Bliss, Rayford Halsted, MD Taking Active   Multiple Vitamin (MULTIVITAMIN) tablet [id number] Yes Take 1 tablet by mouth  daily. [provider] Taking Active Self  Omega-3 Fatty Acids (FISH OIL) 1200 MG CAPS [id number] Yes Take 1 capsule by mouth daily.  [provider] Taking Active Self  ONE TOUCH ULTRA TEST test strip [id number] Yes USE 1 DAILY AS DIRECTED Marletta Lor, MD Taking  Active Self  pantoprazole (PROTONIX) 40 MG tablet [id number] Yes TAKE 1 TABLET BY MOUTH  TWICE DAILY BEFORE MEALS Isaac Bliss, Rayford Halsted, MD Taking Active   pioglitazone (ACTOS) 15 MG tablet [id number] Yes TAKE 1 TABLET BY MOUTH  DAILY Isaac Bliss, Rayford Halsted, MD Taking Active   pregabalin (LYRICA) 75 MG capsule [id number] Yes TAKE 1 CAPSULE BY MOUTH  DAILY Isaac Bliss, Rayford Halsted, MD Taking Active   rOPINIRole (REQUIP) 1 MG tablet [id number] Yes TAKE 1 TABLET BY MOUTH AT  BEDTIME Isaac Bliss, Rayford Halsted, MD Taking Active   tiZANidine (ZANAFLEX) 4 MG tablet [id number] Yes TAKE 1 TABLET BY MOUTH 3  TIMES DAILY AS NEEDED Isaac Bliss, Rayford Halsted, MD Taking Active   tobramycin (TOBREX) 0.3 % ophthalmic solution [id number] Yes Place 1 drop into both eyes every 6 (six) hours. Day before injection, day of injection and day after injection [provider] Taking Active   traZODone (DESYREL) 100 MG tablet [id number] Yes TAKE 1 TO 2 TABLETS BY  MOUTH AT BEDTIME Isaac Bliss, Rayford Halsted, MD Taking Active   Vitamin D, Ergocalciferol, (DRISDOL) 1.25 MG (50000 UNIT) CAPS capsule [id number] Yes TAKE 1 CAPSULE BY MOUTH  EVERY 7 DAYS FOR 12 DOSES Isaac Bliss, Rayford Halsted, MD Taking Active             Patient Active Problem List   Diagnosis Date Noted   Morbid obesity ([place]) 01/13/2018   S/P knee replacement 12/27/2017   Spondylolisthesis at L1-L2 level 03/18/2015   Spondylosis with myelopathy 09/02/2014   Cervical spondylosis with myelopathy 09/02/2014   Anxiety 12/21/2012   Chronic back pain 12/21/2012   Insomnia 12/21/2012   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, L2-5 12/15/2012   Spinal  stenosis, lumbar region, with neurogenic claudication 10/26/2012   Spinal stenosis, thoracic 35/36/1443   Umbilical hernia 15/40/0867   OSA (obstructive sleep apnea) 11/12/2011   Exogenous obesity 11/12/2011   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   Hypothyroidism 09/18/2009   Diabetes mellitus without complication ([place]) 61/95/0932   Dyslipidemia 09/18/2009   Anemia 09/18/2009   Depression 09/18/2009   Essential hypertension 09/18/2009   GERD 09/18/2009   Primary localized osteoarthritis of left knee 09/18/2009    Immunization History  Administered Date(s) Administered   Fluad Quad(high Dose 65+) 12/05/2019, 11/06/2020   Influenza Split 01/28/2011, 11/12/2011   Influenza Whole 11/20/2009   Influenza, High Dose Seasonal PF 12/27/2013, 12/25/2014, 01/19/2016, 12/01/2016, 01/13/2018, 10/26/2018   Influenza,inj,Quad PF,6+ Mos 12/18/2012   PFIZER Comirnaty(Gray Top)Covid-19 Tri-Sucrose Vaccine 06/30/2020   PFIZER(Purple Top)SARS-COV-2 Vaccination 03/16/2019, 04/03/2019, 11/21/2019   Pneumococcal Conjugate-13 12/27/2013   Pneumococcal Polysaccharide-23 11/12/2011   Tdap 07/17/2013   Zoster Recombinat (Shingrix) 11/08/2017, 03/15/2018   Patient has been having more pain in her back. She reports her back pain is getting worse and thinks this is osteoporosis. Patient is having neck pain and this extends to her lower back and down her spine.  Patient has seen her doctor that did partial knee replacement but not sure it was within the last year.   Patient just got home from a trip Saturday night and hasn't taken one in a while. Her daughter drove her to see her friend who lives in [place]. Her friend Opal Sidles has terminal cancer and that's who she saw. She took a couple of Tylenol and a Xanax and muscle relaxer.  Patient is still not sure the trazodone or Lyrica is not working. She has never tried anything else for nerve pain or for sleep other than  Ambien.  Patient has had a fall and it was a  year ago and she slipped on some water. Patient did fall in the grocery store as well in the summer.  Conditions to be addressed/monitored:  Hypertension, Hyperlipidemia, Diabetes, GERD, Hypothyroidism, Depression, Anxiety, Allergic Rhinitis and OSA, pain  Conditions addressed this visit: Hypertension, depression, anxiety, insomnia  Care Plan : CCM Pharmacy Care Plan  Updates made by Viona Gilmore, [place] since 03/24/2021 12:00 AM     Problem: Problem: Hypertension, Hyperlipidemia, Diabetes, GERD, Hypothyroidism, Depression, Anxiety, Allergic Rhinitis and OSA, pain      Long-Range Goal: Patient-Specific Goal   Start Date: 05/12/2020  Expected End Date: 05/12/2021  Recent Progress: On track  Priority: High  Note:   Current Barriers:  Unable to independently monitor therapeutic efficacy Suboptimal therapeutic regimen for cholesterol Suboptimal therapeutic regimen for sleep and nerve pain  Pharmacist Clinical Goal(s):  Patient will achieve adherence to monitoring guidelines and medication adherence to achieve therapeutic efficacy maintain control of cholesterol as evidenced by next lipid panel  achieve improvement in sleep as evidenced by patient report  through collaboration with PharmD and provider.   Interventions: 1:1 collaboration with Isaac Bliss, Rayford Halsted, MD regarding development and update of comprehensive plan of care as evidenced by provider attestation and co-signature Inter-disciplinary care team collaboration (see longitudinal plan of care) Comprehensive medication review performed; medication list updated in electronic medical record  Hypertension (BP goal <130/80) -Controlled -Current treatment: Atenolol 25 mg 1 tablet daily (in AM) - Appropriate, Effective, Safe, Accessible Lisinopril 10 mg 1 tablet daily (in AM) - Appropriate, Effective, Safe, Accessible -Medications previously tried: none -Current home readings: does not regularly check at home (bought  one) -Current dietary habits: does not eat as well as she should -Current exercise habits: no regular exercise -Denies hypotensive/hypertensive symptoms -Educated on Exercise goal of 150 minutes per week; Importance of home blood pressure monitoring; Proper BP monitoring technique; -Counseled to monitor BP at home weekly, document, and provide log at future appointments -Counseled on diet and exercise extensively Recommended to continue current medication  Hyperlipidemia: (LDL goal < 70) -Controlled -Current treatment: Atorvastatin 40 mg 1 tablet daily - Appropriate, Effective, Safe, Accessible Gemfibrozil 600 mg 1 tablet twice daily before meals - Query Appropriate, Effective, Query Safe, Accessible Fish oil 1200 mg 1 capsule daily - Query Appropriate, Query effective, Safe, Accessible -Medications previously tried: none  -Current dietary patterns: does not eat many vegetables -Current exercise habits: no structured exercise -Educated on Cholesterol goals;  Importance of limiting foods high in cholesterol; Exercise goal of 150 minutes per week; -Recommended switching gemfibrozil and fish oil to Vascepa  Diabetes (A1c goal <7%) -Controlled -Current medications: Metformin 1000 mg 1 tablet twice daily with meals - Appropriate, Effective, Safe, Accessible Pioglitazone 15 mg 1 tablet daily - Appropriate, Effective, Safe, Accessible -Medications previously tried: glipizide (lows)  -Current home glucose readings: doesn't check blood sugar regularly fasting glucose: n/a post prandial glucose: n/a -Denies hypoglycemic/hyperglycemic symptoms -Current meal patterns:  breakfast: apple slices and peanut butter, 2 cup of coffee with half an half lunch: n/a dinner: chicken salad with bread or butter lettuce snacks: candy, potato chips, caramel corn drinks: caffeine free diet pepsi, no sugary drinks -Current exercise: no structured exercise -Educated on A1c and blood sugar goals; Exercise  goal of 150 minutes per week; Benefits of routine self-monitoring of blood sugar; Carbohydrate counting and/or plate method -Counseled to check feet daily and get yearly eye exams -Recommended to continue current medication  Provided healthy plate handout  Depression/Anxiety (Goal: minimize symptoms) -Uncontrolled -Current treatment: Citalopram 40 mg 1 tablet daily - Appropriate, Query effective, Safe, Accessible Alprazolam 0.5 mg 1 tablet twice daily as needed - Appropriate, Query effective, Query Safe, Accessible -Medications previously tried/failed: Wellbutrin & Effexor (split personalities), Prozac, Lexapro, Zoloft, amitriptyline -PHQ9: 10 -GAD7: n/a -Educated on Benefits of medication for symptom control Benefits of cognitive-behavioral therapy with or without medication -Recommended to continue current medication Reminded patient to call behavioral health to set up appointment  Insomnia (Goal: improve quality and quantity of sleep) -Uncontrolled -Current treatment  Trazodone 100 mg 1-2 tablets at bedtime as needed - Appropriate, Query effective, Safe, Accessible -Medications previously tried: Ambien  -Counseled on practicing good sleep hygiene by setting a sleep schedule and maintaining it, avoid excessive napping, following a nightly routine, avoiding screen time for 30-60 minutes before going to bed, and making the bedroom a cool, quiet and dark space  Pain (Goal: minimize symptoms) -Uncontrolled -Current treatment  Pregabalin 75 mg 1 capsule daily (more puts her to sleep) - Appropriate, Query effective, Safe, Accessible Hydrocodone-APAP 7.5-325 1 tablet every 6 hours as needed - Appropriate, Effective, Query Safe, Accessible Tizanidine 4 mg tablet 3 times a day (mostly taking 2 as needed) - Appropriate, Effective, Query Safe, Accessible Meloxicam 15 mg 1 tablet daily - Appropriate, Effective, Query Safe, Accessible -Medications previously tried: n/a -Recommended to continue  current medication Counseled on avoidance of taking Hydrocodone, Tizanidine and Alprazolam at the same time  GERD (Goal: minimize symptoms) -Controlled -Current treatment  Pantoprazole 40 mg 1 tablet twice daily before meals as needed - Query Appropriate, Effective, Safe, Accessible -Medications previously tried: n/a -Counseled on non-pharmacologic management of symptoms such as elevating the head of your bed, avoiding eating 2-3 hours before bed, avoiding triggering foods such as acidic, spicy, or fatty foods, eating smaller meals, and wearing clothes that are loose around the waist  Vitamin D deficiency (Goal: vitamin D 30-100) -Controlled -Current treatment  Ergocalciferol 50,000 units 1 tablet at bedtime - Appropriate, Effective, Safe, Accessible -Medications previously tried: n/a  -Recommended to continue current medication Counseled on taking with food to help absorption  Allergic rhinitis (Goal: minimize symptoms) -Controlled -Current treatment  Loratadine 10 mg 1 tablet daily as needed - Appropriate, Effective, Safe, Accessible -Medications previously tried: Advil (anxious)  -Recommended to continue current medication  Hypothyroidism (Goal: TSH 0.35-4.5) -Uncontrolled -Current treatment  Levothyroxine 137 mcg 1 tablet daily - Appropriate, Effective, Query Safe, Accessible -Medications previously tried: none  -Counseled on taking on an empty stomach prior to all other food and medications  Restless legs syndrome (Goal: minimize symptoms) -Controlled -Current treatment  Ropinirole 1 mg 1 tablet at bedtime - Appropriate, Effective, Safe, Accessible -Medications previously tried: n/a  -Recommended to continue current medication Counseled on avoidance of caffeine to minimize symptoms  Osteopenia (Goal improve bone density and prevent fractures) -Uncontrolled -Last DEXA Scan: 05/09/20   T-Score femoral neck: -2.0, -2.4  T-Score total hip: n/a  T-Score lumbar spine:  n/a  T-Score forearm radius: -1.5  10-year probability of major osteoporotic fracture: 14.3%  10-year probability of hip fracture: 4.0% -Patient is a candidate for pharmacologic treatment due to T-Score -1.0 to -2.5 and 10-year risk of hip fracture > 3% -Current treatment  Calcium-vitamin D daily 400 mg - 1 in the morning and 1 in the evening  -Medications previously tried: none -Recommend [phone] units of vitamin D daily. Recommend 1200 mg of calcium daily from dietary and supplemental sources. Recommend  weight-bearing and muscle strengthening exercises for building and maintaining bone density. -Counseled on switching calcium carbonate to calcium citrate to improve absorption   Health Maintenance -Vaccine gaps: none -Current therapy:  Hydrocortisone 2.5% cream Multivitamin 1 tablet daily Docusate 100 mg 2 capsules at bedtime Preservision Areds 2 1 capsule twice daily Lubricant ([place]) Estradiol cream (prolapse) -Educated on Cost vs benefit of each product must be carefully weighed by individual consumer -Patient is satisfied with current therapy and denies issues -Recommended to continue current medication  Patient Goals/Self-Care Activities Patient will:  - take medications as prescribed check glucose a few times a week, document, and provide at future appointments check blood pressure weekly, document, and provide at future appointments target a minimum of 150 minutes of moderate intensity exercise weekly  Follow Up Plan: The care management team will reach out to the patient again over the next 30 days.       Medication Assistance: None required.  Patient affirms current coverage meets needs.  Compliance/Adherence/Medication fill history: Care Gaps: Foot exam, COVID booster Last BP - 130/80 (02/05/21)   Star-Rating Drugs: Pioglitazone (Actos) 15 mg - Last filled 02/09/2021 90 DS at [facility] Metformin (Glucophage) 1000 mg - Last filled 09-25-2020 90 DS at [facility]  (Verified as accurate) Lisinopril (Zestril) 10 mg - Last filled 12/27/2020 90 DS at [facility] Atorvastatin (Lipitor) 40 mg - Last filled 02/09/2021 90 DS at [facility]  Patient's preferred pharmacy is:  [facility] ([place], [address] [place] [address] Phone: [phone] Fax: [phone]  [id number] [address] [place]: [phone]: [phone] [facility] [place] [address] [place] [address]Phone: [phone]: [phone] Uses pill box? No - n/a Pt endorses % compliance - need to readdress   We discussed: Current pharmacy is preferred with insurance plan and patient is satisfied with pharmacy services Patient decided to: Continue current medication management strategy  Care Plan and Follow Up Patient Decision:  Patient agrees to Care Plan and Follow-up.  Plan: The care management team will reach out to the patient again over the next 30 days.  MJeni Salles PharmD BSt [facility] Health CenterClinical Pharmacist [place]ef[place] [place]

## 2021-03-24 ENCOUNTER — Telehealth: Payer: Self-pay | Admitting: Pharmacist

## 2021-03-24 DIAGNOSIS — I10 Essential (primary) hypertension: Secondary | ICD-10-CM | POA: Diagnosis not present

## 2021-03-24 DIAGNOSIS — E785 Hyperlipidemia, unspecified: Secondary | ICD-10-CM | POA: Diagnosis not present

## 2021-03-24 NOTE — Telephone Encounter (Signed)
Called patient to make her aware of the medication changes. Patient will stop the gemfibrozil today. Patient will cut down to 1.5 tablets of the trazodone to see if she notices a change with her sleep. Will plan to follow up in 2 weeks to see if she notices changes with sleep. Patient is aware to go back to 2 full tablets if sleep gets significantly worse.  Patient also reports she feels like her short term memory is getting worse as well. Patient will discuss with PCP at March appt and will make a note on the appt as a reminder as well.

## 2021-03-24 NOTE — Telephone Encounter (Signed)
-----   Message from Henderson Cloud, MD sent at 03/24/2021  2:26 PM EST ----- Regarding: RE: DDI and trazodone taper If she is ok with doing that we can try. ----- Message ----- From: Verner Chol, [facility] Sent: 03/24/2021   2:24 PM EST To: Henderson Cloud, MD Subject: RE: DDI and trazodone taper                    Yeah I get that completely. What if we just decreased the dose a bit to maybe 1.5 tablets to see if she notices a difference with that? She can always go back up on her own if she does feel like it's getting worse. It's tough with her being on so many medications, its hard to know what is doing what.  ----- Message ----- From: Philip Aspen, Limmie Patricia, MD Sent: 03/24/2021   2:19 PM EST To: Verner Chol, RPH Subject: RE: DDI and trazodone taper                    She is "problematic". I am concerned that if we taper trazodone she will worsen and sleep even less and her mental health will spiral. Ok to stop fibrate. We can recheck lipids in 3 months and go from there. ----- Message ----- From: Verner Chol, [facility] Sent: 03/24/2021  11:54 AM EST To: Henderson Cloud, MD Subject: DDI and trazodone taper                        Hi,  I spoke with Ms. Katie Booker and she seems to think that the trazodone is not helping with sleep. What do you think about trying to taper her off to see if that makes a difference (she is currently taking 200 mg/per night)? I know she still struggles with sleep and I think she may be a good candidate for something like amitriptyline that would help with her sleep and nerve pain.  Also, I think I had asked you about this before but gemfibrozil is contraindicated with any statin therapy. What do you think about stopping this and switching to Vascepa instead of the gemfibrozil and fish oil and there is a grant that would cover it for her?  Let me know what you think! Maddie

## 2021-03-24 NOTE — Patient Instructions (Addendum)
Hi Tyriana,  It was great to catch up with you again! Please work on trying to add in more foods with calcium to your daily diet to get enough. Also, please work on adding in as much exercise/walking to your day as you can manage because this plays a big role in your heart health, bone health and mental health.  Also don't forget to call Dr. Verlee Rossetti office to make an appt and find out if you need a referral for a counselor in order to get set up.  Please reach out to me if you have any questions or need anything before I reach back out!  Best, Maddie  Gaylord Shih, PharmD, [facility] Clinical Pharmacist [facility] [phone]   Visit Information   Goals Addressed   None    Patient Care Plan: CCM Pharmacy Care Plan     Problem Identified: Problem: Hypertension, Hyperlipidemia, Diabetes, GERD, Hypothyroidism, Depression, Anxiety, Allergic Rhinitis and OSA, pain      Long-Range Goal: Patient-Specific Goal   Start Date: 05/12/2020  Expected End Date: 05/12/2021  Recent Progress: On track  Priority: High  Note:   Current Barriers:  Unable to independently monitor therapeutic efficacy Suboptimal therapeutic regimen for cholesterol Suboptimal therapeutic regimen for sleep and nerve pain  Pharmacist Clinical Goal(s):  Patient will achieve adherence to monitoring guidelines and medication adherence to achieve therapeutic efficacy maintain control of cholesterol as evidenced by next lipid panel  achieve improvement in sleep as evidenced by patient report  through collaboration with PharmD and provider.   Interventions: 1:1 collaboration with Philip Aspen, Limmie Patricia, MD regarding development and update of comprehensive plan of care as evidenced by provider attestation and co-signature Inter-disciplinary care team collaboration (see longitudinal plan of care) Comprehensive medication review performed; medication list updated in electronic medical  record  Hypertension (BP goal <130/80) -Controlled -Current treatment: Atenolol 25 mg 1 tablet daily (in AM) - Appropriate, Effective, Safe, Accessible Lisinopril 10 mg 1 tablet daily (in AM) - Appropriate, Effective, Safe, Accessible -Medications previously tried: none -Current home readings: does not regularly check at home (bought one) -Current dietary habits: does not eat as well as she should -Current exercise habits: no regular exercise -Denies hypotensive/hypertensive symptoms -Educated on Exercise goal of 150 minutes per week; Importance of home blood pressure monitoring; Proper BP monitoring technique; -Counseled to monitor BP at home weekly, document, and provide log at future appointments -Counseled on diet and exercise extensively Recommended to continue current medication  Hyperlipidemia: (LDL goal < 70) -Controlled -Current treatment: Atorvastatin 40 mg 1 tablet daily - Appropriate, Effective, Safe, Accessible Gemfibrozil 600 mg 1 tablet twice daily before meals - Query Appropriate, Effective, Query Safe, Accessible Fish oil 1200 mg 1 capsule daily - Query Appropriate, Query effective, Safe, Accessible -Medications previously tried: none  -Current dietary patterns: does not eat many vegetables -Current exercise habits: no structured exercise -Educated on Cholesterol goals;  Importance of limiting foods high in cholesterol; Exercise goal of 150 minutes per week; -Recommended switching gemfibrozil and fish oil to Vascepa  Diabetes (A1c goal <7%) -Controlled -Current medications: Metformin 1000 mg 1 tablet twice daily with meals - Appropriate, Effective, Safe, Accessible Pioglitazone 15 mg 1 tablet daily - Appropriate, Effective, Safe, Accessible -Medications previously tried: glipizide (lows)  -Current home glucose readings: doesn't check blood sugar regularly fasting glucose: n/a post prandial glucose: n/a -Denies hypoglycemic/hyperglycemic symptoms -Current  meal patterns:  breakfast: apple slices and peanut butter, 2 cup of coffee with half an half lunch: n/a  dinner: chicken salad with bread or butter lettuce snacks: candy, potato chips, caramel corn drinks: caffeine free diet pepsi, no sugary drinks -Current exercise: no structured exercise -Educated on A1c and blood sugar goals; Exercise goal of 150 minutes per week; Benefits of routine self-monitoring of blood sugar; Carbohydrate counting and/or plate method -Counseled to check feet daily and get yearly eye exams -Recommended to continue current medication Provided healthy plate handout  Depression/Anxiety (Goal: minimize symptoms) -Uncontrolled -Current treatment: Citalopram 40 mg 1 tablet daily - Appropriate, Query effective, Safe, Accessible Alprazolam 0.5 mg 1 tablet twice daily as needed - Appropriate, Query effective, Query Safe, Accessible -Medications previously tried/failed: Wellbutrin & Effexor (split personalities), Prozac, Lexapro, Zoloft, amitriptyline -PHQ9: 10 -GAD7: n/a -Educated on Benefits of medication for symptom control Benefits of cognitive-behavioral therapy with or without medication -Recommended to continue current medication Reminded patient to call behavioral health to set up appointment  Insomnia (Goal: improve quality and quantity of sleep) -Uncontrolled -Current treatment  Trazodone 100 mg 1-2 tablets at bedtime as needed - Appropriate, Query effective, Safe, Accessible -Medications previously tried: Ambien  -Counseled on practicing good sleep hygiene by setting a sleep schedule and maintaining it, avoid excessive napping, following a nightly routine, avoiding screen time for 30-60 minutes before going to bed, and making the bedroom a cool, quiet and dark space  Pain (Goal: minimize symptoms) -Uncontrolled -Current treatment  Pregabalin 75 mg 1 capsule daily (more puts her to sleep) - Appropriate, Query effective, Safe, Accessible Hydrocodone-APAP  7.5-325 1 tablet every 6 hours as needed - Appropriate, Effective, Query Safe, Accessible Tizanidine 4 mg tablet 3 times a day (mostly taking 2 as needed) - Appropriate, Effective, Query Safe, Accessible Meloxicam 15 mg 1 tablet daily - Appropriate, Effective, Query Safe, Accessible -Medications previously tried: n/a -Recommended to continue current medication Counseled on avoidance of taking Hydrocodone, Tizanidine and Alprazolam at the same time  GERD (Goal: minimize symptoms) -Controlled -Current treatment  Pantoprazole 40 mg 1 tablet twice daily before meals as needed - Query Appropriate, Effective, Safe, Accessible -Medications previously tried: n/a -Counseled on non-pharmacologic management of symptoms such as elevating the head of your bed, avoiding eating 2-3 hours before bed, avoiding triggering foods such as acidic, spicy, or fatty foods, eating smaller meals, and wearing clothes that are loose around the waist  Vitamin D deficiency (Goal: vitamin D 30-100) -Controlled -Current treatment  Ergocalciferol 50,000 units 1 tablet at bedtime - Appropriate, Effective, Safe, Accessible -Medications previously tried: n/a  -Recommended to continue current medication Counseled on taking with food to help absorption  Allergic rhinitis (Goal: minimize symptoms) -Controlled -Current treatment  Loratadine 10 mg 1 tablet daily as needed - Appropriate, Effective, Safe, Accessible -Medications previously tried: Advil (anxious)  -Recommended to continue current medication  Hypothyroidism (Goal: TSH 0.35-4.5) -Uncontrolled -Current treatment  Levothyroxine 137 mcg 1 tablet daily - Appropriate, Effective, Query Safe, Accessible -Medications previously tried: none  -Counseled on taking on an empty stomach prior to all other food and medications  Restless legs syndrome (Goal: minimize symptoms) -Controlled -Current treatment  Ropinirole 1 mg 1 tablet at bedtime - Appropriate, Effective,  Safe, Accessible -Medications previously tried: n/a  -Recommended to continue current medication Counseled on avoidance of caffeine to minimize symptoms  Osteopenia (Goal improve bone density and prevent fractures) -Uncontrolled -Last DEXA Scan: 05/09/20   T-Score femoral neck: -2.0, -2.4  T-Score total hip: n/a  T-Score lumbar spine: n/a  T-Score forearm radius: -1.5  10-year probability of major osteoporotic fracture: 14.3%  10-year  probability of hip fracture: 4.0% -Patient is a candidate for pharmacologic treatment due to T-Score -1.0 to -2.5 and 10-year risk of hip fracture > 3% -Current treatment  Calcium-vitamin D daily 400 mg - 1 in the morning and 1 in the evening  -Medications previously tried: none -Recommend [id number] units of vitamin D daily. Recommend 1200 mg of calcium daily from dietary and supplemental sources. Recommend weight-bearing and muscle strengthening exercises for building and maintaining bone density. -Counseled on switching calcium carbonate to calcium citrate to improve absorption   Health Maintenance -Vaccine gaps: none -Current therapy:  Hydrocortisone 2.5% cream Multivitamin 1 tablet daily Docusate 100 mg 2 capsules at bedtime Preservision Areds 2 1 capsule twice daily Lubricant (Jenteal) Estradiol cream (prolapse) -Educated on Cost vs benefit of each product must be carefully weighed by individual consumer -Patient is satisfied with current therapy and denies issues -Recommended to continue current medication  Patient Goals/Self-Care Activities Patient will:  - take medications as prescribed check glucose a few times a week, document, and provide at future appointments check blood pressure weekly, document, and provide at future appointments target a minimum of 150 minutes of moderate intensity exercise weekly  Follow Up Plan: The care management team will reach out to the patient again over the next 30 days.        Patient verbalizes  understanding of instructions and care plan provided today and agrees to view in MyChart. Active MyChart status confirmed with patient.   The pharmacy team will reach out to the patient again over the next 30 days.   Verner Chol, [facility]

## 2021-03-25 ENCOUNTER — Other Ambulatory Visit (INDEPENDENT_AMBULATORY_CARE_PROVIDER_SITE_OTHER): Payer: Medicare Other

## 2021-03-25 DIAGNOSIS — E039 Hypothyroidism, unspecified: Secondary | ICD-10-CM

## 2021-03-25 LAB — TSH: TSH: 1.12 u[IU]/mL (ref 0.35–5.50)

## 2021-04-05 ENCOUNTER — Other Ambulatory Visit: Payer: Self-pay | Admitting: Internal Medicine

## 2021-04-07 ENCOUNTER — Other Ambulatory Visit: Payer: Self-pay | Admitting: Internal Medicine

## 2021-04-07 DIAGNOSIS — G8929 Other chronic pain: Secondary | ICD-10-CM

## 2021-04-09 ENCOUNTER — Telehealth: Payer: Self-pay | Admitting: Pharmacist

## 2021-04-09 NOTE — Chronic Care Management (AMB) (Signed)
Chronic Care Management Pharmacy Assistant   Name: Katie Booker  MRN: [id number] DOB: 1945-06-04  Reason for Encounter: Medication Review Tolerance per MP   Recent office visits:  None   Recent consult visits:  None  Hospital visits:  None in previous 6 months  Medications: Outpatient Encounter Medications as of 04/09/2021  Medication Sig   pregabalin (LYRICA) 75 MG capsule TAKE 1 CAPSULE BY MOUTH DAILY   ALPRAZolam (XANAX) 0.5 MG tablet Take 1 tablet (0.5 mg total) by mouth 2 (two) times daily as needed. for anxiety   atenolol (TENORMIN) 25 MG tablet TAKE 1 TABLET BY MOUTH  DAILY   atorvastatin (LIPITOR) 40 MG tablet TAKE 1 TABLET BY MOUTH DAILY   calcium citrate (CALCITRATE - DOSED IN MG ELEMENTAL CALCIUM) 950 (200 Ca) MG tablet Take 200 mg of elemental calcium by mouth daily.   Carboxymethylcell-Hypromellose (GENTEAL OP) Apply to eye.   Carboxymethylcellulose Sodium (LUBRICANT EYE DROPS OP) Apply 2 drops to eye 4 (four) times daily as needed (dry eyes).    citalopram (CELEXA) 40 MG tablet TAKE 1 TABLET BY MOUTH  DAILY   estradiol (ESTRACE) 0.1 MG/GM vaginal cream PLACE 1 APPLICATORFUL  VAGINALLY 3 TIMES A WEEK.   HYDROcodone-acetaminophen (NORCO) 7.5-325 MG tablet Take 1 tablet by mouth every 6 (six) hours as needed for moderate pain.   HYDROcodone-acetaminophen (NORCO) 7.5-325 MG tablet Take 1 tablet by mouth every 6 (six) hours as needed for moderate pain.   HYDROcodone-acetaminophen (NORCO) 7.5-325 MG tablet Take 1 tablet by mouth every 6 (six) hours as needed for moderate pain.   hydrocortisone 2.5 % cream APPLY TO AFFECTED AREA(S)  TOPICALLY TWICE DAILY   Lancets (ONETOUCH ULTRASOFT) lancets Use daily   levothyroxine (SYNTHROID) 125 MCG tablet Take 1 tablet (125 mcg total) by mouth daily.   lisinopril (ZESTRIL) 10 MG tablet TAKE 1 TABLET BY MOUTH  DAILY   meloxicam (MOBIC) 15 MG tablet TAKE 1 TABLET BY MOUTH  DAILY   metFORMIN (GLUCOPHAGE) 1000 MG tablet TAKE 1  TABLET BY MOUTH  TWICE DAILY WITH MEALS   Misc Natural Products (TURMERIC CURCUMIN) CAPS Take by mouth.   Misc. Devices (BARIATRIC ROLLATOR) MISC 1 Rollator with basket.   Multiple Vitamin (MULTIVITAMIN) tablet Take 1 tablet by mouth daily.   Omega-3 Fatty Acids (FISH OIL) 1200 MG CAPS Take 1 capsule by mouth daily.    ONE TOUCH ULTRA TEST test strip USE 1 DAILY AS DIRECTED   pantoprazole (PROTONIX) 40 MG tablet TAKE 1 TABLET BY MOUTH TWICE  DAILY BEFORE MEALS   pioglitazone (ACTOS) 15 MG tablet TAKE 1 TABLET BY MOUTH  DAILY   rOPINIRole (REQUIP) 1 MG tablet TAKE 1 TABLET BY MOUTH AT  BEDTIME   tiZANidine (ZANAFLEX) 4 MG tablet TAKE 1 TABLET BY MOUTH 3  TIMES DAILY AS NEEDED   tobramycin (TOBREX) 0.3 % ophthalmic solution Place 1 drop into both eyes every 6 (six) hours. Day before injection, day of injection and day after injection   traZODone (DESYREL) 100 MG tablet TAKE 1 TO 2 TABLETS BY  MOUTH AT BEDTIME   Vitamin D, Ergocalciferol, (DRISDOL) 1.25 MG (50000 UNIT) CAPS capsule TAKE 1 CAPSULE BY MOUTH  EVERY 7 DAYS FOR 12 DOSES   No facility-administered encounter medications on file as of 04/09/2021.  Notes: Call to patient per Gaylord Shih to discuss sleeping habits after cutting down her trazodone to 1.5 tabs per conversation with MP. Patient reports she has been alternating 1.5 tabs and 2 tabs  every other day for about 4 nights now. She reports it has not changed her sleeping habits she will sleep about 3 ours a night no more. She is not able to nap sleep or nap during the day. She reports if she is having pain she will take her muscle relaxer along with her pain medication and that helps her to sleep. Otherwise no changes. She also reports she has been experiencing  increased dizziness an feeling of restlessness during the evenings as she tries to settle herself down for the day as if she has"Restless leg syndrome on her entire body" she reports she is unsure if this is due to her stopping the  Gemfibrozil or not. She also wanted me to [facility] MP aware that she will on today make an appointment with her Psychiatrist for her anxiety.   Care Gaps: Foot Exam - Overdue COVID Booster - Overdue BP- 130/80 ( 02/05/21) AWV- 12/22 Lab Results  Component Value Date   HGBA1C 6.1 02/05/2021    Star Rating Drugs: Pioglitazone (Actos) 15 mg - Last filled 02/09/2021 90 DS at [facility] Metformin (Glucophage) 1000 mg - Last filled 09-25-2020 90 DS at [facility] (Verified as accurate) Lisinopril (Zestril) 10 mg - Last filled 12/27/2020 90 DS at [facility] Verified as Accurate Atorvastatin (Lipitor) 40 mg - Last filled 02/09/2021 90 DS at [facility]    Pamala Duffel CMA Clinical Pharmacist Assistant [phone]

## 2021-04-14 ENCOUNTER — Encounter: Payer: Self-pay | Admitting: Internal Medicine

## 2021-04-14 DIAGNOSIS — M48062 Spinal stenosis, lumbar region with neurogenic claudication: Secondary | ICD-10-CM

## 2021-04-15 MED ORDER — HYDROCODONE-ACETAMINOPHEN 7.5-325 MG PO TABS: 1.0000 | ORAL_TABLET | Freq: Four times a day (QID) | ORAL | 0 refills | Status: DC | PRN

## 2021-04-28 DIAGNOSIS — H43813 Vitreous degeneration, bilateral: Secondary | ICD-10-CM | POA: Diagnosis not present

## 2021-04-28 DIAGNOSIS — H35423 Microcystoid degeneration of retina, bilateral: Secondary | ICD-10-CM | POA: Diagnosis not present

## 2021-04-28 DIAGNOSIS — H353211 Exudative age-related macular degeneration, right eye, with active choroidal neovascularization: Secondary | ICD-10-CM | POA: Diagnosis not present

## 2021-04-28 DIAGNOSIS — H35433 Paving stone degeneration of retina, bilateral: Secondary | ICD-10-CM | POA: Diagnosis not present

## 2021-04-28 DIAGNOSIS — H353123 Nonexudative age-related macular degeneration, left eye, advanced atrophic without subfoveal involvement: Secondary | ICD-10-CM | POA: Diagnosis not present

## 2021-05-06 ENCOUNTER — Encounter: Payer: Self-pay | Admitting: Internal Medicine

## 2021-05-06 ENCOUNTER — Telehealth (INDEPENDENT_AMBULATORY_CARE_PROVIDER_SITE_OTHER): Payer: Medicare Other | Admitting: Internal Medicine

## 2021-05-06 DIAGNOSIS — M48062 Spinal stenosis, lumbar region with neurogenic claudication: Secondary | ICD-10-CM | POA: Diagnosis not present

## 2021-05-06 DIAGNOSIS — F419 Anxiety disorder, unspecified: Secondary | ICD-10-CM | POA: Diagnosis not present

## 2021-05-06 MED ORDER — HYDROCODONE-ACETAMINOPHEN 7.5-325 MG PO TABS: 1.0000 | ORAL_TABLET | Freq: Four times a day (QID) | ORAL | 0 refills | Status: DC | PRN

## 2021-05-06 MED ORDER — ALPRAZOLAM 0.5 MG PO TABS: 0.5000 mg | ORAL_TABLET | Freq: Two times a day (BID) | ORAL | 2 refills | Status: DC | PRN

## 2021-05-06 NOTE — Progress Notes (Signed)
? ? ?Virtual Visit via Video Note ? ?I connected with Katie Booker on 05/06/21 at  1:00 PM EDT by a video enabled telemedicine application and verified that I am speaking with the correct person using two identifiers. ? ?Location patient: home ?Location provider: work office ?Persons participating in the virtual visit: patient, provider ? ?I discussed the limitations of evaluation and management by telemedicine and the availability of in person appointments. The patient expressed understanding and agreed to proceed. ? ? ?HPI: ?This visit was scheduled for the purpose of medication refills.  She is on alprazolam 0.5 mg that she takes twice daily as needed for her anxiety, as well as hydrocodone 7.5/325 mg every 6 hours as needed for chronic back pain related to spinal stenosis.  She also describes extreme fatigue, dry cough, has not yet taken a COVID test. ? ? ?ROS: ?Constitutional: Denies fever, chills, diaphoresis, appetite change.  ?HEENT: Denies photophobia, eye pain, redness,  mouth sores, trouble swallowing, neck pain, neck stiffness and tinnitus.   ?Respiratory: Denies SOB, DOE,  chest tightness,  and wheezing.   ?Cardiovascular: Denies chest pain, palpitations and leg swelling.  ?Gastrointestinal: Denies nausea, vomiting, abdominal pain, diarrhea, constipation, blood in stool and abdominal distention.  ?Genitourinary: Denies dysuria, urgency, frequency, hematuria, flank pain and difficulty urinating.  ?Endocrine: Denies: hot or cold intolerance, sweats, changes in hair or nails, polyuria, polydipsia. ?Musculoskeletal: Denies myalgias, back pain, joint swelling, arthralgias and gait problem.  ?Skin: Denies pallor, rash and wound.  ?Neurological: Denies dizziness, seizures, syncope, weakness, light-headedness, numbness and headaches.  ?Hematological: Denies adenopathy. Easy bruising, personal or family bleeding history  ?Psychiatric/Behavioral: Denies suicidal ideation,  confusion, nervousness and  agitation ? ? ?Past Medical History:  ?Diagnosis Date  ? Age-related macular degeneration, wet, right eye (HCC)   ? per pt has had treatment in past  ? Anxiety   ? Chronic constipation   ? Chronic low back pain   ? Chronic neck pain   ? Complication of anesthesia   ? 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 Depression   ? DOE (dyspnea on exertion)   ? GERD   ? Headache   ? Hemorrhoids   ? History of basal cell carcinoma excision   ? left cheek  ? History of colon polyps   ? History of hyperthyroidism 2011  ? RAI treatement  ? History of panic attacks   ? Hyperlipidemia   ? Hypertension   ? Hypothyroidism, postradioiodine therapy   ? followed by pcp  ? IDA (iron deficiency anemia)   ? intermittant  ? Insomnia   ? OA (osteoarthritis)   ? OSA (obstructive sleep apnea)   ? no cpap, per pt tried unable to tolerate  ? Peripheral neuropathy   ? legs and feet from hx back surgery's  ? Rash   ? bilateral axilla area -- per pt due to some personal wipes used other the counter  ? RLS (restless legs syndrome)   ? Spondylolisthesis at L1-L2 level   ? Tingling of both upper extremities   ? left greater than right due to cervical pinched nerve  ? Type 2 diabetes mellitus (HCC)   ? followed by pcp  ? Umbilical hernia   ? Urinary frequency   ? Urinary urgency   ? ? ?Past Surgical History:  ?Procedure Laterality Date  ? ANTERIOR CERVICAL DECOMP/DISCECTOMY FUSION N/A 09/02/2014  ? Procedure: Cervical five- six  Anterior cervical decompression fusion;  Surgeon: Barnett AbuHenry Elsner,  MD;  Location: [facility];  Service: Neurosurgery;  Laterality: N/A;  C5-6 Anterior cervical decompression/diskectomy/fusion  ? BREAST BIOPSY Right 10/2018  ? x 2 benign  ? CARPAL TUNNEL RELEASE Right 2016  ? CATARACT EXTRACTION W/ INTRAOCULAR LENS  IMPLANT, BILATERAL  03/2015  ? COLONOSCOPY  2007  ? ESOPHAGOGASTRODUODENOSCOPY  2007  ? HEMORRHOID SURGERY  03/23/2011  ? Procedure: HEMORRHOIDECTOMY;  Surgeon: Clovis Pu. Cornett, MD;  Location: [facility];  Service: General;  Laterality: N/A;  lateral internal sphincterotomy and hemorrhoidectomy  ? LUMBAR FUSION  02/2015  ? L1 -- 2  ? PARTIAL KNEE ARTHROPLASTY Left 12/27/2017  ? Procedure: UNICOMPARTMENTAL LEFT KNEE;  Surgeon: Teryl Lucy, MD;  Location: [facility];  Service: Orthopedics;  Laterality: Left;  ? THORACIC DISCECTOMY N/A 12/15/2012  ? Procedure: Thoracic ten-eleven Thoracic laminectomy;  Surgeon: Barnett Abu, MD;  Location: [facility];  Service: Neurosurgery;  Laterality: N/A;  Thoracic ten-eleven Thoracic laminectomy  ? TUBAL LIGATION Bilateral 1991  ? ? ?Family History  ?Problem Relation Age of Onset  ? Diabetes Father   ? COPD Father   ? Cancer Maternal Grandfather   ?     stomach  ? ? ?SOCIAL HX:  ? reports that she quit smoking about 17 years ago. Her smoking use included cigarettes. She has a 20.00 pack-year smoking history. She has never used smokeless tobacco. She reports current alcohol use. She reports that she does not use drugs. ? ? ?Current Outpatient Medications:  ?  atenolol (TENORMIN) 25 MG tablet, TAKE 1 TABLET BY MOUTH  DAILY, Disp: 90 tablet, Rfl: 3 ?  atorvastatin (LIPITOR) 40 MG tablet, TAKE 1 TABLET BY MOUTH DAILY, Disp: 90 tablet, Rfl: 1 ?  calcium citrate (CALCITRATE - DOSED IN MG ELEMENTAL CALCIUM) 950 (200 Ca) MG tablet, Take 200 mg of elemental calcium by mouth daily., Disp: , Rfl:  ?  Carboxymethylcell-Hypromellose (GENTEAL OP), Apply to eye., Disp: , Rfl:  ?  Carboxymethylcellulose Sodium (LUBRICANT EYE DROPS OP), Apply 2 drops to eye 4 (four) times daily as needed (dry eyes). , Disp: , Rfl:  ?  citalopram (CELEXA) 40 MG tablet, TAKE 1 TABLET BY MOUTH  DAILY, Disp: 90 tablet, Rfl: 3 ?  estradiol (ESTRACE) 0.1 MG/GM vaginal cream, PLACE 1 APPLICATORFUL  VAGINALLY 3 TIMES A WEEK., Disp: 170 g, Rfl: 3 ?  hydrocortisone 2.5 % cream, APPLY TO AFFECTED AREA(S)  TOPICALLY TWICE DAILY, Disp: 85.05 g, Rfl: 1 ?  Lancets (ONETOUCH ULTRASOFT) lancets, Use daily,  Disp: 100 each, Rfl: 6 ?  levothyroxine (SYNTHROID) 125 MCG tablet, Take 1 tablet (125 mcg total) by mouth daily., Disp: 90 tablet, Rfl: 1 ?  lisinopril (ZESTRIL) 10 MG tablet, TAKE 1 TABLET BY MOUTH  DAILY, Disp: 90 tablet, Rfl: 3 ?  meloxicam (MOBIC) 15 MG tablet, TAKE 1 TABLET BY MOUTH  DAILY, Disp: 90 tablet, Rfl: 3 ?  metFORMIN (GLUCOPHAGE) 1000 MG tablet, TAKE 1 TABLET BY MOUTH  TWICE DAILY WITH MEALS, Disp: 180 tablet, Rfl: 1 ?  Misc Natural Products (TURMERIC CURCUMIN) CAPS, Take by mouth., Disp: , Rfl:  ?  Misc. Devices (BARIATRIC ROLLATOR) MISC, 1 Rollator with basket., Disp: 1 each, Rfl: 0 ?  Multiple Vitamin (MULTIVITAMIN) tablet, Take 1 tablet by mouth daily., Disp: , Rfl:  ?  Omega-3 Fatty Acids (FISH OIL) 1200 MG CAPS, Take 1 capsule by mouth daily. , Disp: , Rfl:  ?  ONE TOUCH ULTRA TEST test strip, USE 1 DAILY AS DIRECTED, Disp: 100  each, Rfl: 3 ?  pantoprazole (PROTONIX) 40 MG tablet, TAKE 1 TABLET BY MOUTH TWICE  DAILY BEFORE MEALS, Disp: 180 tablet, Rfl: 1 ?  pioglitazone (ACTOS) 15 MG tablet, TAKE 1 TABLET BY MOUTH  DAILY, Disp: 90 tablet, Rfl: 1 ?  pregabalin (LYRICA) 75 MG capsule, TAKE 1 CAPSULE BY MOUTH DAILY, Disp: 90 capsule, Rfl: 0 ?  rOPINIRole (REQUIP) 1 MG tablet, TAKE 1 TABLET BY MOUTH AT  BEDTIME, Disp: 90 tablet, Rfl: 3 ?  tiZANidine (ZANAFLEX) 4 MG tablet, TAKE 1 TABLET BY MOUTH 3  TIMES DAILY AS NEEDED, Disp: 180 tablet, Rfl: 1 ?  tobramycin (TOBREX) 0.3 % ophthalmic solution, Place 1 drop into both eyes every 6 (six) hours. Day before injection, day of injection and day after injection, Disp: , Rfl:  ?  traZODone (DESYREL) 100 MG tablet, TAKE 1 TO 2 TABLETS BY  MOUTH AT BEDTIME, Disp: 180 tablet, Rfl: 3 ?  Vitamin D, Ergocalciferol, (DRISDOL) 1.25 MG (50000 UNIT) CAPS capsule, TAKE 1 CAPSULE BY MOUTH  EVERY 7 DAYS FOR 12 DOSES, Disp: 12 capsule, Rfl: 3 ?  ALPRAZolam (XANAX) 0.5 MG tablet, Take 1 tablet (0.5 mg total) by mouth 2 (two) times daily as needed. for anxiety, Disp: 60  tablet, Rfl: 2 ?  HYDROcodone-acetaminophen (NORCO) 7.5-325 MG tablet, Take 1 tablet by mouth every 6 (six) hours as needed for moderate pain., Disp: 120 tablet, Rfl: 0 ?  HYDROcodone-acetaminophen (NORCO) 7.5-3

## 2021-06-05 ENCOUNTER — Telehealth: Payer: Self-pay | Admitting: Pharmacist

## 2021-06-05 NOTE — Chronic Care Management (AMB) (Signed)
? ? ?Chronic Care Management ?Pharmacy Assistant  ? ?Name: Katie Booker  MRN: [id number] DOB: [birth year] ? ?Reason for Encounter: Disease State General Assess per MP ?  ?Recent office visits:  ?05/06/21 Isaac Bliss, Rayford Halsted, MD - Patient presented via video for anxiety and other concerns. No medication changes. ? ?Recent consult visits:  ?None ? ?Hospital visits:  ?None in previous 6 months ? ?Medications: ?Outpatient Encounter Medications as of 06/05/2021  ?Medication Sig  ? ALPRAZolam (XANAX) 0.5 MG tablet Take 1 tablet (0.5 mg total) by mouth 2 (two) times daily as needed. for anxiety  ? atenolol (TENORMIN) 25 MG tablet TAKE 1 TABLET BY MOUTH  DAILY  ? atorvastatin (LIPITOR) 40 MG tablet TAKE 1 TABLET BY MOUTH DAILY  ? calcium citrate (CALCITRATE - DOSED IN MG ELEMENTAL CALCIUM) 950 (200 Ca) MG tablet Take 200 mg of elemental calcium by mouth daily.  ? Carboxymethylcell-Hypromellose (GENTEAL OP) Apply to eye.  ? Carboxymethylcellulose Sodium (LUBRICANT EYE DROPS OP) Apply 2 drops to eye 4 (four) times daily as needed (dry eyes).   ? citalopram (CELEXA) 40 MG tablet TAKE 1 TABLET BY MOUTH  DAILY  ? estradiol (ESTRACE) 0.1 MG/GM vaginal cream PLACE 1 APPLICATORFUL  VAGINALLY 3 TIMES A WEEK.  ? HYDROcodone-acetaminophen (NORCO) 7.5-325 MG tablet Take 1 tablet by mouth every 6 (six) hours as needed for moderate pain.  ? HYDROcodone-acetaminophen (NORCO) 7.5-325 MG tablet Take 1 tablet by mouth every 6 (six) hours as needed for moderate pain.  ? HYDROcodone-acetaminophen (NORCO) 7.5-325 MG tablet Take 1 tablet by mouth every 6 (six) hours as needed for moderate pain.  ? hydrocortisone 2.5 % cream APPLY TO AFFECTED AREA(S)  TOPICALLY TWICE DAILY  ? Lancets (ONETOUCH ULTRASOFT) lancets Use daily  ? levothyroxine (SYNTHROID) 125 MCG tablet Take 1 tablet (125 mcg total) by mouth daily.  ? lisinopril (ZESTRIL) 10 MG tablet TAKE 1 TABLET BY MOUTH  DAILY  ? meloxicam (MOBIC) 15 MG tablet TAKE 1 TABLET BY MOUTH  DAILY   ? metFORMIN (GLUCOPHAGE) 1000 MG tablet TAKE 1 TABLET BY MOUTH  TWICE DAILY WITH MEALS  ? Misc Natural Products (TURMERIC CURCUMIN) CAPS Take by mouth.  ? Misc. Devices (BARIATRIC ROLLATOR) MISC 1 Rollator with basket.  ? Multiple Vitamin (MULTIVITAMIN) tablet Take 1 tablet by mouth daily.  ? Omega-3 Fatty Acids (FISH OIL) 1200 MG CAPS Take 1 capsule by mouth daily.   ? ONE TOUCH ULTRA TEST test strip USE 1 DAILY AS DIRECTED  ? pantoprazole (PROTONIX) 40 MG tablet TAKE 1 TABLET BY MOUTH TWICE  DAILY BEFORE MEALS  ? pioglitazone (ACTOS) 15 MG tablet TAKE 1 TABLET BY MOUTH  DAILY  ? pregabalin (LYRICA) 75 MG capsule TAKE 1 CAPSULE BY MOUTH DAILY  ? rOPINIRole (REQUIP) 1 MG tablet TAKE 1 TABLET BY MOUTH AT  BEDTIME  ? tiZANidine (ZANAFLEX) 4 MG tablet TAKE 1 TABLET BY MOUTH 3  TIMES DAILY AS NEEDED  ? tobramycin (TOBREX) 0.3 % ophthalmic solution Place 1 drop into both eyes every 6 (six) hours. Day before injection, day of injection and day after injection  ? traZODone (DESYREL) 100 MG tablet TAKE 1 TO 2 TABLETS BY  MOUTH AT BEDTIME  ? Vitamin D, Ergocalciferol, (DRISDOL) 1.25 MG (50000 UNIT) CAPS capsule TAKE 1 CAPSULE BY MOUTH  EVERY 7 DAYS FOR 12 DOSES  ? ?No facility-administered encounter medications on file as of 06/05/2021.  ? ?Contacted [name] for General Review Call ? ? ? ?Disease State Questions: ? ?  Able to connect with Patient? No ? ?15. Additional Details? Yes  ?Call to patient to see if she is still alternating the Trazodone 1.5 tabs and 2 tabs per Jeni Salles, did not reach left voicemail for return call. ? ? ? ?Care Gaps: ?Foot Exam - Overdue ?COVID Booster - Overdue ?BP- 130/80 12/22 ?AWV- 12/22 ?CCM- Unable to reach need in June ?Lab Results  ?Component Value Date  ? HGBA1C 6.1 02/05/2021  ? ? ?Star Rating Drugs: ?Pioglitazone (Actos) 15 mg - Last filled 04/13/21 90 DS at [facility] ?Metformin (Glucophage) 1000 mg - Last filled 05/05/21 100 DS at [facility] ?Lisinopril (Zestril) 10 mg - Last filled  12/27/2020 90 DS at [facility] ?Atorvastatin (Lipitor) 40 mg - Last filled 05/05/21 90 DS at [facility] ?Verified as accurate ? ?Ned Clines CMA ?Clinical Pharmacist Assistant ?[phone] ? ?

## 2021-06-07 ENCOUNTER — Other Ambulatory Visit: Payer: Self-pay | Admitting: Internal Medicine

## 2021-06-07 DIAGNOSIS — E039 Hypothyroidism, unspecified: Secondary | ICD-10-CM

## 2021-06-08 ENCOUNTER — Other Ambulatory Visit: Payer: Self-pay | Admitting: Internal Medicine

## 2021-06-16 ENCOUNTER — Encounter: Payer: Self-pay | Admitting: Internal Medicine

## 2021-06-17 ENCOUNTER — Telehealth: Payer: Self-pay | Admitting: Internal Medicine

## 2021-06-17 ENCOUNTER — Encounter: Payer: Self-pay | Admitting: Internal Medicine

## 2021-06-17 DIAGNOSIS — M48062 Spinal stenosis, lumbar region with neurogenic claudication: Secondary | ICD-10-CM

## 2021-06-17 MED ORDER — HYDROCODONE-ACETAMINOPHEN 7.5-325 MG PO TABS: 1.0000 | ORAL_TABLET | Freq: Four times a day (QID) | ORAL | 0 refills | Status: DC | PRN

## 2021-06-17 NOTE — Addendum Note (Signed)
Addended by: Kern Reap B on: 06/17/2021 03:51 PM ? ? Modules accepted: Orders ? ?

## 2021-06-17 NOTE — Telephone Encounter (Signed)
Katie Booker with [facility] is calling and the only hydrocodone in stock is 10-325 mg. Please advise ?

## 2021-06-21 ENCOUNTER — Other Ambulatory Visit: Payer: Self-pay | Admitting: Internal Medicine

## 2021-06-21 DIAGNOSIS — M48062 Spinal stenosis, lumbar region with neurogenic claudication: Secondary | ICD-10-CM

## 2021-06-29 ENCOUNTER — Other Ambulatory Visit: Payer: Self-pay | Admitting: Internal Medicine

## 2021-06-29 DIAGNOSIS — G8929 Other chronic pain: Secondary | ICD-10-CM

## 2021-07-07 ENCOUNTER — Telehealth: Payer: Self-pay | Admitting: Pharmacist

## 2021-07-07 NOTE — Chronic Care Management (AMB) (Signed)
Chronic Care Management Pharmacy Assistant   Name: Katie Booker  MRN: [id number] DOB: 07-20-1945  Reason for Encounter: Disease State General Assessment per MP   Recent office visits:  None  Recent consult visits:  None  Hospital visits:  None in previous 6 months  Medications: Outpatient Encounter Medications as of 07/07/2021  Medication Sig   ALPRAZolam (XANAX) 0.5 MG tablet Take 1 tablet (0.5 mg total) by mouth 2 (two) times daily as needed. for anxiety   atenolol (TENORMIN) 25 MG tablet TAKE 1 TABLET BY MOUTH  DAILY   atorvastatin (LIPITOR) 40 MG tablet TAKE 1 TABLET BY MOUTH DAILY   calcium citrate (CALCITRATE - DOSED IN MG ELEMENTAL CALCIUM) 950 (200 Ca) MG tablet Take 200 mg of elemental calcium by mouth daily.   Carboxymethylcell-Hypromellose (GENTEAL OP) Apply to eye.   Carboxymethylcellulose Sodium (LUBRICANT EYE DROPS OP) Apply 2 drops to eye 4 (four) times daily as needed (dry eyes).    citalopram (CELEXA) 40 MG tablet TAKE 1 TABLET BY MOUTH  DAILY   estradiol (ESTRACE) 0.1 MG/GM vaginal cream PLACE 1 APPLICATORFUL  VAGINALLY 3 TIMES A WEEK.   HYDROcodone-acetaminophen (NORCO) 7.5-325 MG tablet Take 1 tablet by mouth every 6 (six) hours as needed for moderate pain.   HYDROcodone-acetaminophen (NORCO) 7.5-325 MG tablet Take 1 tablet by mouth every 6 (six) hours as needed for moderate pain.   HYDROcodone-acetaminophen (NORCO) 7.5-325 MG tablet Take 1 tablet by mouth every 6 (six) hours as needed for moderate pain.   hydrocortisone 2.5 % cream APPLY TO AFFECTED AREA(S)  TOPICALLY TWICE DAILY   Lancets (ONETOUCH ULTRASOFT) lancets Use daily   levothyroxine (SYNTHROID) 125 MCG tablet TAKE 1 TABLET BY MOUTH DAILY   lisinopril (ZESTRIL) 10 MG tablet TAKE 1 TABLET BY MOUTH  DAILY   meloxicam (MOBIC) 15 MG tablet TAKE 1 TABLET BY MOUTH  DAILY   metFORMIN (GLUCOPHAGE) 1000 MG tablet TAKE 1 TABLET BY MOUTH  TWICE DAILY WITH MEALS   Misc Natural Products (TURMERIC  CURCUMIN) CAPS Take by mouth.   Misc. Devices (BARIATRIC ROLLATOR) MISC 1 Rollator with basket.   Multiple Vitamin (MULTIVITAMIN) tablet Take 1 tablet by mouth daily.   Omega-3 Fatty Acids (FISH OIL) 1200 MG CAPS Take 1 capsule by mouth daily.    ONE TOUCH ULTRA TEST test strip USE 1 DAILY AS DIRECTED   pantoprazole (PROTONIX) 40 MG tablet TAKE 1 TABLET BY MOUTH TWICE  DAILY BEFORE MEALS   pioglitazone (ACTOS) 15 MG tablet TAKE 1 TABLET BY MOUTH DAILY   pregabalin (LYRICA) 75 MG capsule TAKE 1 CAPSULE BY MOUTH DAILY   rOPINIRole (REQUIP) 1 MG tablet TAKE 1 TABLET BY MOUTH AT  BEDTIME   tiZANidine (ZANAFLEX) 4 MG tablet TAKE 1 TABLET BY MOUTH 3 TIMES  DAILY AS NEEDED   tobramycin (TOBREX) 0.3 % ophthalmic solution Place 1 drop into both eyes every 6 (six) hours. Day before injection, day of injection and day after injection   traZODone (DESYREL) 100 MG tablet TAKE 1 TO 2 TABLETS BY  MOUTH AT BEDTIME   Vitamin D, Ergocalciferol, (DRISDOL) 1.25 MG (50000 UNIT) CAPS capsule TAKE 1 CAPSULE BY MOUTH  EVERY 7 DAYS FOR 12 DOSES   No facility-administered encounter medications on file as of 07/07/2021.   Contacted [name] for General Review Call   Chart Review:  Have there been any documented new, changed, or discontinued medications since last visit? No  Has there been any documented recent hospitalizations or ED  visits since last visit with Clinical Pharmacist? No    Adherence Review:  Does the Clinical Pharmacist Assistant have access to adherence rates? Yes Adherence rates for STAR metric medications see end of note Does the patient have >5 day gap between last estimated fill dates for any of the above medications or other medication gaps? Yes    Disease State Questions:  Able to connect with Patient? Yes  Did patient have any problems with their health recently? Yes Patient reports she has not been successful with the use of her C-PAP, she has the information to send it  back to the company and will but has not yet had a chance to do so.  Have you had any admissions or emergency room visits or worsening of your condition(s) since last visit? No   Have you had any visits with new specialists or providers since your last visit? No  Have you had any new health care problem(s) since your last visit? No  Have you run out of any of your medications since you last spoke with clinical pharmacist? NoLisinopril (Zestril) 10 mg patient reports compliance in use still has quite a bit as she had stopped taking for some time per PCP and she reports it is still being filled with [facility].  Are you having any issues or side effects with your medications? No  Do you have any other health concerns or questions you want to discuss with your Clinical Pharmacist before your next visit? Yes Call to patient to see if she is still alternating between 1.5 and 2 tabs of Trazodone and if it has improved her sleep patterns. Patient to return to 2 tabs of Trazodone nightly per MP if sleeping is significantly worse. Patient reports she has been taking 1.5 tabs nightly since we spoke instead of every other day. She reports she thinks she has been sleeping somewhat better although she is still tired at times. She reports she wakes up due to pain but has not been awake as long as she has previously so thinks the 1.5 mg is best for her at this time.  Are there any health concerns that you feel we can do a better job addressing? No Patient is appreciative of our patience and services.  Are you having any problems with any of the following since the last visit: (select all that apply)  None  12. Any falls since last visit? No  13. Any increased or uncontrolled pain since last visit? Yes  Details:Patient reports she is always up at night due to pain not a new problem.  14. Next visit Type: telephone       Visit with:MP        Date:6/23        Time: Afternoon pt preference  15. Additional  Details? Yes      Care Gaps: Colonoscopy - Overdue Foot Exam - Overdue COVID Booster - Overdue BP- 130/80 ( 02/05/21) AWV- 12/22 CCM - 6/23 Lab Results  Component Value Date   HGBA1C 6.1 02/05/2021     Star Rating Drugs: Pioglitazone (Actos) 15 mg - Last filled 04/13/21 90 DS at [facility] Metformin (Glucophage) 1000 mg - Last filled 05/05/21 100 DS at [facility] Lisinopril (Zestril) 10 mg - Last filled 12/27/2020 90 DS at [facility] Atorvastatin (Lipitor) 40 mg - Last filled 05/05/21 90 DS at [facility] Verified as accurate Patient reports she had held taking it due to PCP advice and had a bit, is compliant in current use per PT.  Laresia Green CMA Clinical Pharmacist Assistant [phone]  

## 2021-07-09 ENCOUNTER — Other Ambulatory Visit: Payer: Self-pay | Admitting: Internal Medicine

## 2021-07-16 ENCOUNTER — Encounter: Payer: Self-pay | Admitting: Internal Medicine

## 2021-07-16 DIAGNOSIS — M48062 Spinal stenosis, lumbar region with neurogenic claudication: Secondary | ICD-10-CM

## 2021-07-16 MED ORDER — HYDROCODONE-ACETAMINOPHEN 7.5-325 MG PO TABS: 1.0000 | ORAL_TABLET | Freq: Four times a day (QID) | ORAL | 0 refills | Status: DC | PRN

## 2021-08-10 ENCOUNTER — Other Ambulatory Visit: Payer: Self-pay | Admitting: Internal Medicine

## 2021-08-14 ENCOUNTER — Encounter: Payer: Self-pay | Admitting: Internal Medicine

## 2021-08-14 ENCOUNTER — Telehealth: Payer: Self-pay | Admitting: Pharmacist

## 2021-08-17 ENCOUNTER — Ambulatory Visit (INDEPENDENT_AMBULATORY_CARE_PROVIDER_SITE_OTHER): Payer: Medicare Other | Admitting: Pharmacist

## 2021-08-17 DIAGNOSIS — I10 Essential (primary) hypertension: Secondary | ICD-10-CM

## 2021-08-17 DIAGNOSIS — F419 Anxiety disorder, unspecified: Secondary | ICD-10-CM

## 2021-08-18 ENCOUNTER — Encounter: Payer: Self-pay | Admitting: Internal Medicine

## 2021-08-18 ENCOUNTER — Telehealth (INDEPENDENT_AMBULATORY_CARE_PROVIDER_SITE_OTHER): Payer: Medicare Other | Admitting: Internal Medicine

## 2021-08-18 VITALS — Wt 192.0 lb

## 2021-08-18 DIAGNOSIS — M48062 Spinal stenosis, lumbar region with neurogenic claudication: Secondary | ICD-10-CM | POA: Diagnosis not present

## 2021-08-18 DIAGNOSIS — G8929 Other chronic pain: Secondary | ICD-10-CM | POA: Diagnosis not present

## 2021-08-18 DIAGNOSIS — M545 Low back pain, unspecified: Secondary | ICD-10-CM | POA: Diagnosis not present

## 2021-08-18 DIAGNOSIS — M4804 Spinal stenosis, thoracic region: Secondary | ICD-10-CM

## 2021-08-18 DIAGNOSIS — Z1211 Encounter for screening for malignant neoplasm of colon: Secondary | ICD-10-CM

## 2021-08-18 MED ORDER — HYDROCODONE-ACETAMINOPHEN 7.5-325 MG PO TABS: 1.0000 | ORAL_TABLET | Freq: Four times a day (QID) | ORAL | 0 refills | Status: DC | PRN

## 2021-08-18 NOTE — Progress Notes (Signed)
Virtual Visit via Video Note  I connected with Katie Booker on 08/18/21 at  3:30 PM EDT by a video enabled telemedicine application and verified that I am speaking with the correct person using two identifiers.  Location patient: home Location provider: work office Persons participating in the virtual visit: patient, provider  I discussed the limitations of evaluation and management by telemedicine and the availability of in person appointments. The patient expressed understanding and agreed to proceed.   HPI: This is a scheduled visit for medication refills per protocol.  She is on hydrocodone for chronic back pain due to spinal stenosis and is due for refills today.  She is requesting pain management referral.   ROS: Constitutional: Denies fever, chills, diaphoresis, appetite change and fatigue.  HEENT: Denies photophobia, eye pain, redness, hearing loss, ear pain, congestion, sore throat, rhinorrhea, sneezing, mouth sores, trouble swallowing, neck pain, neck stiffness and tinnitus.   Respiratory: Denies SOB, DOE, cough, chest tightness,  and wheezing.   Cardiovascular: Denies chest pain, palpitations and leg swelling.  Gastrointestinal: Denies nausea, vomiting, abdominal pain, diarrhea, constipation, blood in stool and abdominal distention.  Genitourinary: Denies dysuria, urgency, frequency, hematuria, flank pain and difficulty urinating.  Endocrine: Denies: hot or cold intolerance, sweats, changes in hair or nails, polyuria, polydipsia. Musculoskeletal: Positive for myalgias, back pain, joint swelling, arthralgias and gait problem.  Skin: Denies pallor, rash and wound.  Neurological: Denies dizziness, seizures, syncope, weakness, light-headedness, numbness and headaches.  Hematological: Denies adenopathy. 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   Depression    DOE (dyspnea on exertion)    GERD    Headache    Hemorrhoids    History of basal cell carcinoma excision    left cheek   History of colon polyps    History of hyperthyroidism 2011   RAI treatement   History of panic attacks    Hyperlipidemia    Hypertension    Hypothyroidism, postradioiodine therapy    followed by pcp   IDA (iron deficiency anemia)    intermittant   Insomnia    OA (osteoarthritis)    OSA (obstructive sleep apnea)    no cpap, per pt tried unable to tolerate   Peripheral neuropathy    legs and feet from hx back surgery's   Rash    bilateral axilla area -- per pt due to some personal wipes used other the counter   RLS (restless legs syndrome)    Spondylolisthesis at L1-L2 level    Tingling of both upper extremities    left greater than right due to cervical pinched nerve   Type 2 diabetes mellitus (HCC)    followed by pcp   Umbilical hernia    Urinary frequency    Urinary urgency     Past Surgical History:  Procedure Laterality Date   ANTERIOR CERVICAL DECOMP/DISCECTOMY FUSION N/A 09/02/2014   Procedure: Cervical five- six  Anterior cervical decompression fusion;  Surgeon: Barnett Abu, MD;  Location: [facility];  Service: Neurosurgery;  Laterality:  N/A;  C5-6 Anterior cervical decompression/diskectomy/fusion   BREAST BIOPSY Right 10/2018   x 2 benign   CARPAL TUNNEL RELEASE Right 2016   CATARACT EXTRACTION W/ INTRAOCULAR LENS  IMPLANT, BILATERAL  03/2015   COLONOSCOPY  2007   ESOPHAGOGASTRODUODENOSCOPY  2007   HEMORRHOID SURGERY  03/23/2011   Procedure: HEMORRHOIDECTOMY;  Surgeon: Clovis Pu. Cornett, MD;  Location: [facility];  Service: General;  Laterality: N/A;  lateral internal sphincterotomy and hemorrhoidectomy   LUMBAR FUSION  02/2015   L1 -- 2   PARTIAL KNEE ARTHROPLASTY Left 12/27/2017   Procedure: UNICOMPARTMENTAL LEFT KNEE;  Surgeon: Teryl Lucy, MD;  Location: [facility];  Service: Orthopedics;  Laterality: Left;   THORACIC DISCECTOMY N/A 12/15/2012   Procedure: Thoracic ten-eleven Thoracic laminectomy;  Surgeon: Barnett Abu, MD;  Location: [facility];  Service: Neurosurgery;  Laterality: N/A;  Thoracic ten-eleven Thoracic laminectomy   TUBAL LIGATION Bilateral 1991    Family History  Problem Relation Age of Onset   Diabetes Father    COPD Father    Cancer Maternal Grandfather        stomach    SOCIAL HX:   reports that she quit smoking about 17 years ago. Her smoking use included cigarettes. She has a 20.00 pack-year smoking history. She has never used smokeless tobacco. She reports current alcohol use. She reports that she does not use drugs.   Current Outpatient Medications:    ALPRAZolam (XANAX) 0.5 MG tablet, Take 1 tablet (0.5 mg total) by mouth 2 (two) times daily as needed. for anxiety, Disp: 60 tablet, Rfl: 2   atenolol (TENORMIN) 25 MG tablet, TAKE 1 TABLET BY MOUTH  DAILY, Disp: 90 tablet, Rfl: 1   atorvastatin (LIPITOR) 40 MG tablet, TAKE 1 TABLET BY MOUTH DAILY, Disp: 100 tablet, Rfl: 2   calcium citrate (CALCITRATE - DOSED IN MG ELEMENTAL CALCIUM) 950 (200 Ca) MG tablet, Take 200 mg of elemental calcium by mouth daily., Disp: , Rfl:    Carboxymethylcell-Hypromellose (GENTEAL OP), Apply to eye., Disp: , Rfl:    Carboxymethylcellulose Sodium (LUBRICANT EYE DROPS OP), Apply 2 drops to eye 4 (four) times daily as needed (dry eyes). , Disp: , Rfl:    citalopram (CELEXA) 40 MG tablet, TAKE 1 TABLET BY MOUTH  DAILY, Disp: 90 tablet, Rfl: 3   estradiol (ESTRACE) 0.1 MG/GM vaginal cream, PLACE 1 APPLICATORFUL  VAGINALLY 3 TIMES A WEEK., Disp: 170 g, Rfl: 3    HYDROcodone-acetaminophen (NORCO) 7.5-325 MG tablet, Take 1 tablet by mouth every 6 (six) hours as needed for moderate pain., Disp: 120 tablet, Rfl: 0   hydrocortisone 2.5 % cream, APPLY TO AFFECTED AREA(S)  TOPICALLY TWICE DAILY, Disp: 85.05 g, Rfl: 1   Lancets (ONETOUCH ULTRASOFT) lancets, Use daily, Disp: 100 each, Rfl: 6   levothyroxine (SYNTHROID) 125 MCG tablet, TAKE 1 TABLET BY MOUTH DAILY, Disp: 90 tablet, Rfl: 1   lisinopril (ZESTRIL) 10 MG tablet, TAKE 1 TABLET BY MOUTH  DAILY, Disp: 90 tablet, Rfl: 3   meloxicam (MOBIC) 15 MG tablet, TAKE 1 TABLET BY MOUTH  DAILY, Disp: 90 tablet, Rfl: 3   metFORMIN (GLUCOPHAGE) 1000 MG tablet, TAKE 1 TABLET BY MOUTH  TWICE DAILY WITH MEALS, Disp: 200 tablet, Rfl: 2   Misc Natural Products (TURMERIC CURCUMIN) CAPS, Take by mouth., Disp: , Rfl:    Misc. Devices (BARIATRIC ROLLATOR) MISC, 1 Rollator with basket., Disp: 1 each, Rfl: 0   Multiple Vitamin (MULTIVITAMIN) tablet, Take 1 tablet by mouth daily.,  Disp: , Rfl:    Omega-3 Fatty Acids (FISH OIL) 1200 MG CAPS, Take 1 capsule by mouth daily. , Disp: , Rfl:    ONE TOUCH ULTRA TEST test strip, USE 1 DAILY AS DIRECTED, Disp: 100 each, Rfl: 3   pantoprazole (PROTONIX) 40 MG tablet, TAKE 1 TABLET BY MOUTH TWICE  DAILY BEFORE MEALS, Disp: 180 tablet, Rfl: 1   pioglitazone (ACTOS) 15 MG tablet, TAKE 1 TABLET BY MOUTH DAILY, Disp: 90 tablet, Rfl: 1   pregabalin (LYRICA) 75 MG capsule, TAKE 1 CAPSULE BY MOUTH DAILY, Disp: 90 capsule, Rfl: 1   rOPINIRole (REQUIP) 1 MG tablet, TAKE 1 TABLET BY MOUTH AT  BEDTIME, Disp: 90 tablet, Rfl: 1   tiZANidine (ZANAFLEX) 4 MG tablet, TAKE 1 TABLET BY MOUTH 3 TIMES  DAILY AS NEEDED, Disp: 180 tablet, Rfl: 1   tobramycin (TOBREX) 0.3 % ophthalmic solution, Place 1 drop into both eyes every 6 (six) hours. Day before injection, day of injection and day after injection, Disp: , Rfl:    traZODone (DESYREL) 100 MG tablet, TAKE 1 TO 2 TABLETS BY  MOUTH AT BEDTIME (Patient taking  differently: Take one and half tablets at bedtime), Disp: 180 tablet, Rfl: 3   Vitamin D, Ergocalciferol, (DRISDOL) 1.25 MG (50000 UNIT) CAPS capsule, TAKE 1 CAPSULE BY MOUTH  EVERY 7 DAYS FOR 12 DOSES, Disp: 12 capsule, Rfl: 3   HYDROcodone-acetaminophen (NORCO) 7.5-325 MG tablet, Take 1 tablet by mouth every 6 (six) hours as needed for moderate pain., Disp: 120 tablet, Rfl: 0   HYDROcodone-acetaminophen (NORCO) 7.5-325 MG tablet, Take 1 tablet by mouth every 6 (six) hours as needed for moderate pain., Disp: 120 tablet, Rfl: 0  EXAM:   VITALS per patient if applicable: None reported  GENERAL: alert, oriented, appears well and in no acute distress  HEENT: atraumatic, conjunttiva clear, no obvious abnormalities on inspection of external nose and ears  NECK: normal movements of the head and neck  LUNGS: on inspection no signs of respiratory distress, breathing rate appears normal, no obvious gross increased work of breathing, gasping or wheezing  CV: no obvious cyanosis  MS: moves all visible extremities without noticeable abnormality  PSYCH/NEURO: pleasant and cooperative, no obvious depression or anxiety, speech and thought processing grossly intact  ASSESSMENT AND PLAN:   Spinal stenosis, thoracic  Screening for malignant neoplasm of colon - Plan: Ambulatory referral to Gastroenterology  Chronic midline low back pain without sciatica  Spinal stenosis, lumbar region, with neurogenic claudication - Plan: HYDROcodone-acetaminophen (NORCO) 7.5-325 MG tablet, HYDROcodone-acetaminophen (NORCO) 7.5-325 MG tablet  -PDMP reviewed, no red flags, overdose risk score is 230.  Refill hydrocodone 7.5/325 mg to take every 6 hours as needed for pain for total of 120 tablets a month x3 months.     I discussed the assessment and treatment plan with the patient. The patient was provided an opportunity to ask questions and all were answered. The patient agreed with the plan and demonstrated an  understanding of the instructions.   The patient was advised to call back or seek an in-person evaluation if the symptoms worsen or if the condition fails to improve as anticipated.    Chaya Jan, MD  [facility]

## 2021-08-21 DIAGNOSIS — I10 Essential (primary) hypertension: Secondary | ICD-10-CM

## 2021-08-21 DIAGNOSIS — E039 Hypothyroidism, unspecified: Secondary | ICD-10-CM | POA: Diagnosis not present

## 2021-08-21 DIAGNOSIS — E785 Hyperlipidemia, unspecified: Secondary | ICD-10-CM | POA: Diagnosis not present

## 2021-08-21 DIAGNOSIS — E1159 Type 2 diabetes mellitus with other circulatory complications: Secondary | ICD-10-CM | POA: Diagnosis not present

## 2021-08-21 DIAGNOSIS — F32A Depression, unspecified: Secondary | ICD-10-CM | POA: Diagnosis not present

## 2021-08-21 DIAGNOSIS — Z7984 Long term (current) use of oral hypoglycemic drugs: Secondary | ICD-10-CM

## 2021-08-26 DIAGNOSIS — H43813 Vitreous degeneration, bilateral: Secondary | ICD-10-CM | POA: Diagnosis not present

## 2021-08-26 DIAGNOSIS — H43392 Other vitreous opacities, left eye: Secondary | ICD-10-CM | POA: Diagnosis not present

## 2021-08-26 DIAGNOSIS — H353123 Nonexudative age-related macular degeneration, left eye, advanced atrophic without subfoveal involvement: Secondary | ICD-10-CM | POA: Diagnosis not present

## 2021-08-26 DIAGNOSIS — H353212 Exudative age-related macular degeneration, right eye, with inactive choroidal neovascularization: Secondary | ICD-10-CM | POA: Diagnosis not present

## 2021-08-26 DIAGNOSIS — H35372 Puckering of macula, left eye: Secondary | ICD-10-CM | POA: Diagnosis not present

## 2021-09-01 ENCOUNTER — Encounter: Payer: Self-pay | Admitting: Internal Medicine

## 2021-09-01 DIAGNOSIS — F419 Anxiety disorder, unspecified: Secondary | ICD-10-CM

## 2021-09-01 MED ORDER — ALPRAZOLAM 0.5 MG PO TABS: 0.5000 mg | ORAL_TABLET | Freq: Two times a day (BID) | ORAL | 2 refills | Status: DC | PRN

## 2021-09-02 ENCOUNTER — Other Ambulatory Visit: Payer: Self-pay | Admitting: Internal Medicine

## 2021-09-04 DIAGNOSIS — G4700 Insomnia, unspecified: Secondary | ICD-10-CM | POA: Diagnosis not present

## 2021-09-04 DIAGNOSIS — F32A Depression, unspecified: Secondary | ICD-10-CM | POA: Diagnosis not present

## 2021-09-07 ENCOUNTER — Encounter: Payer: Self-pay | Admitting: Internal Medicine

## 2021-09-09 ENCOUNTER — Other Ambulatory Visit: Payer: Self-pay | Admitting: Internal Medicine

## 2021-09-09 DIAGNOSIS — E039 Hypothyroidism, unspecified: Secondary | ICD-10-CM

## 2021-09-10 ENCOUNTER — Other Ambulatory Visit: Payer: Self-pay | Admitting: Internal Medicine

## 2021-09-10 ENCOUNTER — Ambulatory Visit (INDEPENDENT_AMBULATORY_CARE_PROVIDER_SITE_OTHER): Payer: Medicare Other | Admitting: Internal Medicine

## 2021-09-10 ENCOUNTER — Encounter: Payer: Self-pay | Admitting: Internal Medicine

## 2021-09-10 VITALS — BP 110/56 | HR 75 | Temp 98.0°F | Wt 198.6 lb

## 2021-09-10 DIAGNOSIS — E1169 Type 2 diabetes mellitus with other specified complication: Secondary | ICD-10-CM

## 2021-09-10 DIAGNOSIS — M4804 Spinal stenosis, thoracic region: Secondary | ICD-10-CM

## 2021-09-10 DIAGNOSIS — N814 Uterovaginal prolapse, unspecified: Secondary | ICD-10-CM | POA: Diagnosis not present

## 2021-09-10 DIAGNOSIS — M79672 Pain in left foot: Secondary | ICD-10-CM | POA: Diagnosis not present

## 2021-09-10 DIAGNOSIS — M48062 Spinal stenosis, lumbar region with neurogenic claudication: Secondary | ICD-10-CM | POA: Diagnosis not present

## 2021-09-10 LAB — POCT GLYCOSYLATED HEMOGLOBIN (HGB A1C): Hemoglobin A1C: 5.9 % — AB (ref 4.0–5.6)

## 2021-09-10 NOTE — Progress Notes (Signed)
Established Patient Office Visit     CC/Reason for Visit: Left heel pain  HPI: Katie Booker is a 76 y.o. female who is coming in today for the above mentioned reasons.  She has been experiencing left heel pain for the past 10 days.  About a month ago she suffered an injury while in bed.  Her cat was laying on her foot and she wiggled her foot suddenly which caused immediate pain to her Achilles area and arch area.  She had bruising immediately after that has since resolved.  She continues to have pain and significant swelling of her left Achilles tendon that is causing some gait issues.  She also wants me to follow-up on her referrals to GYN and pain management that had been placed previously for which she has not received a call back yet.  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   Depression    DOE (dyspnea on exertion)    GERD    Headache    Hemorrhoids    History of basal cell carcinoma excision    left cheek   History of colon polyps    History of hyperthyroidism 2011   RAI treatement   History of panic attacks    Hyperlipidemia    Hypertension    Hypothyroidism, postradioiodine therapy    followed by pcp   IDA (iron deficiency anemia)    intermittant   Insomnia    OA (osteoarthritis)    OSA (obstructive sleep apnea)    no cpap, per pt tried unable to tolerate   Peripheral neuropathy    legs and feet from hx back surgery's   Rash    bilateral axilla area -- per pt due to some personal wipes used other the counter   RLS (restless legs syndrome)    Spondylolisthesis at L1-L2 level    Tingling of both upper extremities    left greater than right due to cervical pinched  nerve   Type 2 diabetes mellitus (HCC)    followed by pcp   Umbilical hernia    Urinary frequency    Urinary urgency     Past Surgical History:  Procedure Laterality Date   ANTERIOR CERVICAL DECOMP/DISCECTOMY FUSION N/A 09/02/2014   Procedure: Cervical five- six  Anterior cervical decompression fusion;  Surgeon: Barnett Abu, MD;  Location: [facility];  Service: Neurosurgery;  Laterality: N/A;  C5-6 Anterior cervical decompression/diskectomy/fusion   BREAST BIOPSY Right 10/2018   x 2 benign   CARPAL TUNNEL RELEASE Right 2016   CATARACT EXTRACTION W/ INTRAOCULAR LENS  IMPLANT, BILATERAL  03/2015   COLONOSCOPY  2007   ESOPHAGOGASTRODUODENOSCOPY  2007   HEMORRHOID SURGERY  03/23/2011   Procedure: HEMORRHOIDECTOMY;  Surgeon: Clovis Pu. Cornett, MD;  Location: [facility];  Service: General;  Laterality: N/A;  lateral internal sphincterotomy and hemorrhoidectomy   LUMBAR FUSION  02/2015   L1 -- 2   PARTIAL KNEE ARTHROPLASTY Left 12/27/2017   Procedure: UNICOMPARTMENTAL LEFT KNEE;  Surgeon: Teryl Lucy, MD;  Location: [facility];  Service: Orthopedics;  Laterality: Left;   THORACIC DISCECTOMY N/A 12/15/2012   Procedure:  Thoracic ten-eleven Thoracic laminectomy;  Surgeon: Kristeen Miss, MD;  Location: [facility];  Service: Neurosurgery;  Laterality: N/A;  Thoracic ten-eleven Thoracic laminectomy   TUBAL LIGATION Bilateral 1991    Social History:  reports that she quit smoking about 17 years ago. Her smoking use included cigarettes. She has a 20.00 pack-year smoking history. She has never used smokeless tobacco. She reports current alcohol use. She reports that she does not use drugs.  Allergies: Allergies  Allergen Reactions   Duraprep [Antiseptic Products, Misc.] Itching and Rash    Irritation everywhere prep was used   Advil Allergy Sinus [Chlorpheniramine-Pse-Ibuprofen]     Nervousness    Betadine [Povidone Iodine] Other (See Comments)    Burning sensation.     Hydromorphone Hcl Other (See Comments)    Makes crazy   Morphine And Related Other (See Comments)    hallucinations    Family History:  Family History  Problem Relation Age of Onset   Diabetes Father    COPD Father    Cancer Maternal Grandfather        stomach     Current Outpatient Medications:    ALPRAZolam (XANAX) 0.5 MG tablet, Take 1 tablet (0.5 mg total) by mouth 2 (two) times daily as needed. for anxiety, Disp: 60 tablet, Rfl: 2   atenolol (TENORMIN) 25 MG tablet, TAKE 1 TABLET BY MOUTH  DAILY, Disp: 90 tablet, Rfl: 1   atorvastatin (LIPITOR) 40 MG tablet, TAKE 1 TABLET BY MOUTH DAILY, Disp: 100 tablet, Rfl: 2   calcium citrate (CALCITRATE - DOSED IN MG ELEMENTAL CALCIUM) 950 (200 Ca) MG tablet, Take 200 mg of elemental calcium by mouth daily., Disp: , Rfl:    Carboxymethylcell-Hypromellose (GENTEAL OP), Apply to eye., Disp: , Rfl:    Carboxymethylcellulose Sodium (LUBRICANT EYE DROPS OP), Apply 2 drops to eye 4 (four) times daily as needed (dry eyes). , Disp: , Rfl:    citalopram (CELEXA) 40 MG tablet, TAKE 1 TABLET BY MOUTH  DAILY, Disp: 90 tablet, Rfl: 3   estradiol (ESTRACE) 0.1 MG/GM vaginal cream, PLACE 1 APPLICATORFUL  VAGINALLY 3 TIMES A WEEK., Disp: 170 g, Rfl: 3   HYDROcodone-acetaminophen (NORCO) 7.5-325 MG tablet, Take 1 tablet by mouth every 6 (six) hours as needed for moderate pain., Disp: 120 tablet, Rfl: 0   HYDROcodone-acetaminophen (NORCO) 7.5-325 MG tablet, Take 1 tablet by mouth every 6 (six) hours as needed for moderate pain., Disp: 120 tablet, Rfl: 0   HYDROcodone-acetaminophen (NORCO) 7.5-325 MG tablet, Take 1 tablet by mouth every 6 (six) hours as needed for moderate pain., Disp: 120 tablet, Rfl: 0   hydrocortisone 2.5 % cream, APPLY TO AFFECTED AREA(S)  TOPICALLY TWICE DAILY, Disp: 85.05 g, Rfl: 1   Lancets (ONETOUCH ULTRASOFT) lancets, Use daily, Disp: 100 each, Rfl: 6   levothyroxine (SYNTHROID) 125 MCG tablet, TAKE 1 TABLET BY MOUTH DAILY, Disp: 90  tablet, Rfl: 1   lisinopril (ZESTRIL) 10 MG tablet, TAKE 1 TABLET BY MOUTH  DAILY, Disp: 90 tablet, Rfl: 3   meloxicam (MOBIC) 15 MG tablet, TAKE 1 TABLET BY MOUTH  DAILY, Disp: 90 tablet, Rfl: 1   metFORMIN (GLUCOPHAGE) 1000 MG tablet, TAKE 1 TABLET BY MOUTH  TWICE DAILY WITH MEALS, Disp: 200 tablet, Rfl: 2   Misc Natural Products (TURMERIC CURCUMIN) CAPS, Take by mouth., Disp: , Rfl:    Misc. Devices (BARIATRIC ROLLATOR) MISC, 1 Rollator with basket., Disp: 1 each, Rfl: 0   Multiple Vitamin (MULTIVITAMIN) tablet, Take 1 tablet by mouth  daily., Disp: , Rfl:    Omega-3 Fatty Acids (FISH OIL) 1200 MG CAPS, Take 1 capsule by mouth daily. , Disp: , Rfl:    ONE TOUCH ULTRA TEST test strip, USE 1 DAILY AS DIRECTED, Disp: 100 each, Rfl: 3   pantoprazole (PROTONIX) 40 MG tablet, TAKE 1 TABLET BY MOUTH TWICE  DAILY BEFORE MEALS, Disp: 180 tablet, Rfl: 1   pioglitazone (ACTOS) 15 MG tablet, TAKE 1 TABLET BY MOUTH DAILY, Disp: 90 tablet, Rfl: 1   pregabalin (LYRICA) 75 MG capsule, TAKE 1 CAPSULE BY MOUTH DAILY, Disp: 90 capsule, Rfl: 1   rOPINIRole (REQUIP) 1 MG tablet, TAKE 1 TABLET BY MOUTH AT  BEDTIME, Disp: 90 tablet, Rfl: 1   tiZANidine (ZANAFLEX) 4 MG tablet, TAKE 1 TABLET BY MOUTH 3 TIMES  DAILY AS NEEDED, Disp: 180 tablet, Rfl: 1   tobramycin (TOBREX) 0.3 % ophthalmic solution, Place 1 drop into both eyes every 6 (six) hours. Day before injection, day of injection and day after injection, Disp: , Rfl:    traZODone (DESYREL) 100 MG tablet, TAKE 1 TO 2 TABLETS BY  MOUTH AT BEDTIME (Patient taking differently: Take one and half tablets at bedtime), Disp: 180 tablet, Rfl: 3   Vitamin D, Ergocalciferol, (DRISDOL) 1.25 MG (50000 UNIT) CAPS capsule, TAKE 1 CAPSULE BY MOUTH  EVERY 7 DAYS FOR 12 DOSES, Disp: 12 capsule, Rfl: 3  Review of Systems:  Constitutional: Denies fever, chills, diaphoresis, appetite change and fatigue.  HEENT: Denies photophobia, eye pain, redness, hearing loss, ear pain, congestion,  sore throat, rhinorrhea, sneezing, mouth sores, trouble swallowing, neck pain, neck stiffness and tinnitus.   Respiratory: Denies SOB, DOE, cough, chest tightness,  and wheezing.   Cardiovascular: Denies chest pain, palpitations and leg swelling.  Gastrointestinal: Denies nausea, vomiting, abdominal pain, diarrhea, constipation, blood in stool and abdominal distention.  Genitourinary: Denies dysuria, urgency, frequency, hematuria, flank pain and difficulty urinating.  Endocrine: Denies: hot or cold intolerance, sweats, changes in hair or nails, polyuria, polydipsia. Skin: Denies pallor, rash and wound.  Neurological: Denies dizziness, seizures, syncope, weakness, light-headedness, numbness and headaches.  Hematological: Denies adenopathy. Easy bruising, personal or family bleeding history  Psychiatric/Behavioral: Denies suicidal ideation, mood changes, confusion, nervousness, sleep disturbance and agitation    Physical Exam: Vitals:   09/10/21 0947  BP: (!) 110/56  Pulse: 75  Temp: 98 F (36.7 C)  TempSrc: Oral  SpO2: 94%  Weight: 198 lb 9.6 oz (90.1 kg)    Body mass index is 38.79 kg/m.   Constitutional: NAD, calm, comfortable Eyes: PERRL, lids and conjunctivae normal ENMT: Mucous membranes are moist. Respiratory: clear to auscultation bilaterally, no wheezing, no crackles. Normal respiratory effort. No accessory muscle use.  Cardiovascular: Regular rate and rhythm, no murmurs / rubs / gallops. No extremity edema. Psychiatric: Normal judgment and insight. Alert and oriented x 3. Normal mood.    Impression and Plan:  Type 2 diabetes mellitus with other specified complication, without long-term current use of insulin ([place])  - Plan: POCT glycosylated hemoglobin (Hb A1C) -A1c is well controlled at 5.9.  Spinal stenosis, lumbar region, with neurogenic claudication Spinal stenosis, thoracic  - Plan: Ambulatory referral to Pain Clinic  Uterine prolapse  - Plan: Ambulatory  referral to Gynecology  Morbid obesity ([place]) -Discussed healthy lifestyle, including increased physical activity and better food choices to promote weight loss.  Pain of left heel  - Plan: Ambulatory referral to [facility] like an Achilles injury with pain and localized swelling and erythema  to that area.  I will refer to sports medicine for further evaluation.  She is already on daily meloxicam.    Time spent:32 minutes reviewing chart, interviewing and examining patient and formulating plan of care.   Patient Instructions  -Nice seeing you today!!  -Referrals have been placed for sports medicine, GYN and pain management.  -See you back in 3 months.    Chaya Jan, MD [facility]

## 2021-09-10 NOTE — Patient Instructions (Signed)
-  Nice seeing you today!!  -Referrals have been placed for sports medicine, GYN and pain management.  -See you back in 3 months.

## 2021-09-14 ENCOUNTER — Ambulatory Visit: Payer: Medicare Other | Admitting: Family Medicine

## 2021-09-14 ENCOUNTER — Ambulatory Visit: Payer: Self-pay

## 2021-09-14 VITALS — BP 150/86 | HR 85 | Ht 60.0 in | Wt 198.8 lb

## 2021-09-14 DIAGNOSIS — G8929 Other chronic pain: Secondary | ICD-10-CM

## 2021-09-14 DIAGNOSIS — M79672 Pain in left foot: Secondary | ICD-10-CM | POA: Diagnosis not present

## 2021-09-14 DIAGNOSIS — M545 Low back pain, unspecified: Secondary | ICD-10-CM | POA: Diagnosis not present

## 2021-09-14 DIAGNOSIS — M25559 Pain in unspecified hip: Secondary | ICD-10-CM | POA: Diagnosis not present

## 2021-09-14 NOTE — Progress Notes (Signed)
I, Philbert Riser, LAT, ATC acting as a scribe for Katie Graham, MD.  Subjective:    CC: L heel pain  HPI: Pt is a 76 y/o female c/o L heel pain ongoing for about 2 weeks. About a month ago, she suffered an injury while in bed.  Her cat was laying on her foot and she wiggled her foot suddenly which caused immediate pain to her Achilles area and arch area.  She had bruising  after that has since resolved. A few days after, she fell down when in the bathroom She continues to have pain and significant swelling of her left Achilles tendon that is causing some gait issues. Pt locates pain to posterior aspect of the L calcaneous and into L Achille's. Pt notes a lump along the medial aspect of the L lower leg. Pt has had a partial knee replacement on her R knee.   Additionally she notes pain in her lumbar spine and bilateral hips chronically.  She has a history of significant back pain and spinal fusion in the past.  She has done physical therapy in the past.  Swelling: yes Aggravates: walking Treatments tried: ice, Aspercream  Pertinent review of Systems: No fevers or chills  Relevant historical information: Hypertension.  Diabetes.   Objective:    Vitals:   09/14/21 1410  BP: (!) 150/86  Pulse: 85  SpO2: 94%   General: Well Developed, well nourished, and in no acute distress.   MSK: Left Achilles: Slightly swollen appearing posterior calcaneus.  Otherwise normal. Tender palpation posterior calcaneus. Decreased range of motion to foot dorsiflexion. Intact strength however pain with resisted foot plantarflexion. Pulses cap refill and sensation are intact distally.  L-spine: Decreased lumbar motion.  Using a cane to ambulate. Lateral hips: Tender palpation greater trochanter bilaterally.  Lab and Radiology Results  Diagnostic Limited MSK Ultrasound of: Left Achilles Achilles tendon is thick but intact without visible tear. Calcific change present at distal Achilles tendon  insertion site. Moderate retrocalcaneal bursitis is present. Increased vascular activity present deep to Achilles tendon seen on Doppler Impression: Achilles tendinitis and peritendinitis.  Retrocalcaneal bursitis.     Impression and Recommendations:    Assessment and Plan: 76 y.o. female with left Achilles tendinitis and retrocalcaneal bursitis.  Plan to treat with eccentric exercises taught in clinic today by ATC as noted below.   Additionally she has chronic back and lateral hip pain thought to be greater trochanteric bursitis and chronic axial back pain.  She is a great candidate for physical therapy for these issues.  Physical therapy goals are to improve functional ability and decrease chronic daily pain.  I do not think physical therapy will resolve her symptoms but she very likely will have some functional improvement which is reasonable and appropriate for her goals.  If conventional physical therapy is not successful she may be a good candidate for aquatic physical therapy.  Check in 1 month.Marland Kitchen  PDMP not reviewed this encounter. Orders Placed This Encounter  Procedures   [place] LIMITED JOINT SPACE STRUCTURES LOW LEFT(NO LINKED CHARGES)    Order Specific Question:   Reason for Exam (SYMPTOM  OR DIAGNOSIS REQUIRED)    Answer:   left heel pain.    Order Specific Question:   Preferred imaging location?    Answer:    [facility] referral to Physical Therapy    Referral Priority:   Routine    Referral Type:   Physical Medicine    Referral Reason:  Specialty Services Required    Requested Specialty:   Physical Therapy    Number of Visits Requested:   1   No orders of the defined types were placed in this encounter.   Discussed warning signs or symptoms. Please see discharge instructions. Patient expresses understanding.   The above documentation has been reviewed and is accurate and complete Katie Booker, M.D.   [phone]; 15 additional minutes  spent for Therapeutic exercises as stated in above notes.  This included exercises focusing on stretching, strengthening, with significant focus on eccentric aspects.   Long term goals include an improvement in range of motion, strength, endurance as well as avoiding reinjury. Patient's frequency would include in 1-2 times a day, 3-5 times a week for a duration of 6-12 weeks.  Proper technique shown and discussed handout in great detail with ATC.  All questions were discussed and answered.

## 2021-09-14 NOTE — Patient Instructions (Addendum)
Thank you for coming in today.   I've referred you to Physical Therapy.  Let us know if you don't hear from them in one week.   Please complete the exercises that the athletic trainer went over with you:  View at [email or web address] using code: [email or web address]  Check back in 1 month

## 2021-09-16 DIAGNOSIS — M545 Low back pain, unspecified: Secondary | ICD-10-CM | POA: Diagnosis not present

## 2021-09-23 ENCOUNTER — Other Ambulatory Visit: Payer: Self-pay | Admitting: Internal Medicine

## 2021-09-23 DIAGNOSIS — E039 Hypothyroidism, unspecified: Secondary | ICD-10-CM

## 2021-09-23 DIAGNOSIS — M48062 Spinal stenosis, lumbar region with neurogenic claudication: Secondary | ICD-10-CM

## 2021-09-28 ENCOUNTER — Ambulatory Visit: Payer: Medicare Other | Admitting: Physical Therapy

## 2021-09-28 ENCOUNTER — Encounter: Payer: Self-pay | Admitting: Physical Therapy

## 2021-09-28 DIAGNOSIS — R262 Difficulty in walking, not elsewhere classified: Secondary | ICD-10-CM | POA: Diagnosis not present

## 2021-09-28 DIAGNOSIS — M5459 Other low back pain: Secondary | ICD-10-CM

## 2021-09-28 DIAGNOSIS — M6281 Muscle weakness (generalized): Secondary | ICD-10-CM | POA: Diagnosis not present

## 2021-09-28 DIAGNOSIS — M25552 Pain in left hip: Secondary | ICD-10-CM | POA: Diagnosis not present

## 2021-09-28 DIAGNOSIS — M25572 Pain in left ankle and joints of left foot: Secondary | ICD-10-CM | POA: Diagnosis not present

## 2021-09-28 DIAGNOSIS — M25551 Pain in right hip: Secondary | ICD-10-CM

## 2021-09-28 NOTE — Therapy (Signed)
OUTPATIENT PHYSICAL THERAPY THORACOLUMBAR EVALUATION   Patient Name: Katie Booker MRN: [id number] DOB:1946-02-22, 76 y.o., female Today's Date: 09/28/2021   PT End of Session - 09/28/21 1445     Visit Number 1    Number of Visits 16    Date for PT Re-Evaluation 11/23/21    Authorization Type [employer]    PT Start Time 1445   pt 14 minutes late to check in   PT Stop Time 1525    PT Time Calculation (min) 40 min             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   Depression    DOE (dyspnea on exertion)    GERD    Headache    Hemorrhoids    History of basal cell carcinoma excision    left cheek   History of colon polyps    History of hyperthyroidism 2011   RAI treatement   History of panic attacks    Hyperlipidemia    Hypertension    Hypothyroidism, postradioiodine therapy    followed by pcp   IDA (iron deficiency anemia)    intermittant   Insomnia    OA (osteoarthritis)    OSA (obstructive sleep apnea)    no cpap, per pt tried unable to tolerate   Peripheral neuropathy    legs and feet from hx back surgery's   Rash    bilateral axilla area -- per pt due to some personal wipes used other the counter   RLS (restless legs syndrome)    Spondylolisthesis at L1-L2 level    Tingling of both upper extremities    left greater than right due to cervical pinched nerve   Type 2 diabetes mellitus (HCC)    followed by pcp   Umbilical hernia    Urinary frequency    Urinary urgency    Past Surgical History:  Procedure Laterality Date   ANTERIOR CERVICAL DECOMP/DISCECTOMY FUSION N/A 09/02/2014   Procedure: Cervical five- six  Anterior cervical decompression fusion;  Surgeon: Barnett Abu, MD;  Location: [facility];  Service: Neurosurgery;  Laterality: N/A;  C5-6 Anterior cervical decompression/diskectomy/fusion   BREAST BIOPSY Right 10/2018   x 2 benign   CARPAL TUNNEL RELEASE Right 2016   CATARACT EXTRACTION W/ INTRAOCULAR LENS  IMPLANT, BILATERAL  03/2015   COLONOSCOPY  2007   ESOPHAGOGASTRODUODENOSCOPY  2007   HEMORRHOID SURGERY  03/23/2011   Procedure: HEMORRHOIDECTOMY;  Surgeon: Clovis Pu. Cornett, MD;  Location: [facility];  Service: General;  Laterality: N/A;  lateral internal sphincterotomy and hemorrhoidectomy   LUMBAR FUSION  02/2015   L1 -- 2   PARTIAL KNEE ARTHROPLASTY Left 12/27/2017   Procedure: UNICOMPARTMENTAL LEFT KNEE;  Surgeon: Teryl Lucy, MD;  Location: [facility];  Service: Orthopedics;  Laterality: Left;   THORACIC DISCECTOMY N/A 12/15/2012   Procedure: Thoracic ten-eleven Thoracic laminectomy;  Surgeon: Barnett Abu, MD;  Location: [facility];  Service: Neurosurgery;  Laterality: N/A;  Thoracic ten-eleven Thoracic laminectomy   TUBAL LIGATION Bilateral 1991   Patient Active Problem List   Diagnosis Date Noted   Morbid obesity (HCC) 01/13/2018   S/P  knee replacement 12/27/2017   Spondylolisthesis at L1-L2 level 03/18/2015   Spondylosis with myelopathy 09/02/2014   Cervical spondylosis with myelopathy 09/02/2014   Anxiety 12/21/2012   Chronic back pain 12/21/2012   Insomnia 12/21/2012   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, L2-5 12/15/2012   Spinal stenosis, lumbar region, with neurogenic claudication 10/26/2012   Spinal stenosis, thoracic 10/26/2012   Umbilical hernia 02/25/2012   OSA (obstructive sleep apnea) 11/12/2011   Exogenous obesity 11/12/2011   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   Hypothyroidism 09/18/2009   Diabetes mellitus without complication (HCC) 09/18/2009   Dyslipidemia 09/18/2009   Anemia 09/18/2009   Depression 09/18/2009   Essential hypertension 09/18/2009   GERD 09/18/2009   Primary localized osteoarthritis of left  knee 09/18/2009    PCP: Philip Aspen, Minerva Ends  REFERRING PROVIDER: Rodolph Bong, MD   REFERRING DIAG: [phone] (ICD-10-CM) - Pain of left heel  Rationale for Evaluation and Treatment Rehabilitation  THERAPY DIAG:  Bilateral hip pain  Other low back pain  Acute left ankle pain  Muscle weakness (generalized)  Difficulty in walking, not elsewhere classified  ONSET DATE: years  SUBJECTIVE:                                                                                                                                                                                           SUBJECTIVE STATEMENT: States she has had chronic back pain. States her last fusion was in 2017 and she was still having pain above and below her previous fusions. States MD would not recommend additional surgery at this time secondary to high risk of infection. States that her pain is increasing and that it is going up her back and into her neck. States she is worried she is getting osteoporosis. States she takes medication every 6 hours for her pain, and continues to have pain before the 6 hours comes.   States she is miserable as she is in constant pain. States then her foot injury happened when she twisted her foot to get her cat off her leg. State she has been having swelling in her left ankle  States that  she feels her balance is getting worse. States she can't cook and stand in the kitchen. States she can't visit with family anymore like she would like to.   States she has been using the walker since her first back surgery.    PERTINENT HISTORY:  left heel pain and Chronic back and hip pain, x2 lumbar fusions L1-5 SP (last fusion 02/2015), cervical surgery, R carpal tunnel, osteopenia, neck pain  PAIN:  Are you having pain? Yes: NPRS scale: 2/10 Pain location: left  foot, B low back/hip R>L, piriformis muscles Pain description: dull achy Aggravating factors: unsure Relieving factors: unsure     PRECAUTIONS: Fall  WEIGHT BEARING RESTRICTIONS No  FALLS:  Has patient fallen in last 6 months? Yes. Number of falls 4 last one a couple weeks ago  LIVING ENVIRONMENT: Lives with: lives alone Lives in: House/apartment Stairs: No Has following equipment at home: Quad cane small base and , grab bars in bath room, Rolator,   OCCUPATION: not working  PLOF: Independent with household mobility with device, Independent with community mobility with device, and Needs assistance with homemaking  PATIENT GOALS to improve her balance if possible    OBJECTIVE:   DIAGNOSTIC FINDINGS:  09/14/21 US  Impression: Achilles tendinitis and peritendinitis.  Retrocalcaneal bursitis    SCREENING FOR RED FLAGS: Bowel or bladder incontinence: No Spinal tumors: No Cauda equina syndrome: No Compression fracture: No Abdominal aneurysm: No  COGNITION:  Overall cognitive status: Within functional limits for tasks assessed     SENSATION: Not tested    POSTURE: rounded shoulders, forward head, and flexed trunk , swelling noted throughout left ankle and calf  Balance: Narrow BOS no UE support 27 seconds, unable to stand on either leg  LUMBAR ROM: - not formally tested on this date  Active  A/PROM  eval  Flexion   Extension   Right lateral flexion   Left lateral flexion   Right rotation   Left rotation    (Blank rows = not tested)     LE Measurements Lower Extremity Right 09/28/2021 Left 09/28/2021   A/PROM MMT A/PROM MMT  Hip Flexion  4  4  Hip Extension      Hip Abduction      Hip Adduction      Hip Internal rotation      Hip External rotation      Knee Flexion  4-  3+*  Knee Extension  4-  3+*  Ankle Dorsiflexion      Ankle Plantarflexion      Ankle Inversion      Ankle Eversion       (Blank rows = not tested)  * pain   LUMBAR SPECIAL TESTS:  Neg slump test B  FUNCTIONAL TESTS:  STS - uses hands to stand up and sit down  GAIT: Distance walked: 50  feet Assistive device utilized:  rollator Level of assistance: SBA Comments: wide base of support, hips ER, slower gait, unsteady, trendelenburg B, antalgic left ankle     TODAY'S TREATMENT  09/28/2021 Therapeutic Exercise:  Aerobic: Supine: Prone:  Seated: LAQS x10 B 5" holds  Standing: Neuromuscular Re-education: narrow BOS at counter x3 30" holds Manual Therapy: Therapeutic Activity: Self Care: Trigger Point Dry Needling:  Modalities:    PATIENT EDUCATION:  Education details: on current presentation, on HEP, on clinical outcomes score and POC, on balance and gait Person educated: Patient Education method: Explanation, Demonstration, and Handouts Education comprehension: verbalized understanding   HOME EXERCISE PROGRAM: [id number]  ASSESSMENT:  CLINICAL IMPRESSION: Patient presents with chronic pain in back, hips and legs as well as balance difficulties. Session focused on education and current presentation as well as plan moving forward. Patient would greatly benefit from skilled PT to improve overall function, QOL and reduce risk of falling.   OBJECTIVE IMPAIRMENTS Abnormal gait, decreased activity tolerance, decreased balance, decreased mobility, difficulty walking, decreased ROM, decreased strength, postural dysfunction, and pain.   ACTIVITY LIMITATIONS bending, standing, stairs, transfers, bed mobility, bathing, and locomotion level  PARTICIPATION  LIMITATIONS: meal prep, cleaning, and community activity  PERSONAL FACTORS Age, Fitness, and 3+ comorbidities: x2 lumbar fusions, chronic pain, lives a lone, cervical surgery  are also affecting patient's functional outcome.   REHAB POTENTIAL: Good  CLINICAL DECISION MAKING: Evolving/moderate complexity  EVALUATION COMPLEXITY: Moderate    GOALS: Goals reviewed with patient?  yes  SHORT TERM GOALS:  Patient will be independent in self management strategies to improve quality of life and functional  outcomes. Baseline: new program Target date: 10/26/2021 Goal status: INITIAL  2.  Patient will report at least 25% improvement in overall symptoms and/or function to demonstrate improved functional mobility Baseline: 0% Target date: 10/26/2021 Goal status: INITIAL  3.  Patient will be able to balance for at least 45 seconds in narrow BOS without UE support to demonstrate improved static balance. Baseline: see above Target date: 10/26/2021 Goal status: INITIAL      LONG TERM GOALS:  Patient will report at least 50% improvement in overall symptoms and/or function to demonstrate improved functional mobility Baseline: 0% Target date: 11/23/2021 Goal status: INITIAL  2.  Patient will be able to stand for at least 15 minutes to improve functional endurance with standing to prepare a meal Baseline: unable Target date: 11/23/2021 Goal status: INITIAL  3.  Patient will be able to balance in semi tandem for at least 15 seconds with either leg forward to demonstrate improved static balance Baseline: unable Target date: 11/23/2021 Goal status: INITIAL    PLAN: PT FREQUENCY: 2x/week  PT DURATION: 8 weeks  PLANNED INTERVENTIONS: Therapeutic exercises, Therapeutic activity, Neuromuscular re-education, Balance training, Gait training, Patient/Family education, Self Care, Joint mobilization, Joint manipulation, Stair training, Orthotic/Fit training, Aquatic Therapy, Dry Needling, Electrical stimulation, Cryotherapy, Moist heat, Vasopneumatic device, Traction, Ionotophoresis 4mg /ml Dexamethasone, and Manual therapy.  PLAN FOR NEXT SESSION: BERG test, LE strength and balance, ROM as needed, manual as indicated. - consider aquatics if land based therapy not helpful.    3:42 PM, 09/28/21 11/28/21, DPT Physical Therapy with [facility]

## 2021-09-30 ENCOUNTER — Telehealth: Payer: Self-pay | Admitting: Gastroenterology

## 2021-09-30 NOTE — Telephone Encounter (Signed)
Good afternoon Dr.Armbruster,   (Supervising MD AM)  The following patient is requesting transfer of care and 08/18/2021 a referral for a routine screening was received. We have received records from [facility] and they are attached. Please review and advise of scheduling. Thank you.

## 2021-10-01 ENCOUNTER — Ambulatory Visit: Payer: Medicare Other | Admitting: Physical Therapy

## 2021-10-01 ENCOUNTER — Encounter: Payer: Self-pay | Admitting: Physical Therapy

## 2021-10-01 DIAGNOSIS — M25552 Pain in left hip: Secondary | ICD-10-CM

## 2021-10-01 DIAGNOSIS — M25551 Pain in right hip: Secondary | ICD-10-CM

## 2021-10-01 DIAGNOSIS — M6281 Muscle weakness (generalized): Secondary | ICD-10-CM

## 2021-10-01 DIAGNOSIS — M25572 Pain in left ankle and joints of left foot: Secondary | ICD-10-CM | POA: Diagnosis not present

## 2021-10-01 DIAGNOSIS — R262 Difficulty in walking, not elsewhere classified: Secondary | ICD-10-CM

## 2021-10-01 DIAGNOSIS — M5459 Other low back pain: Secondary | ICD-10-CM

## 2021-10-01 NOTE — Telephone Encounter (Signed)
I am happy to accept this patient however not sure if she needs a clinic visit or direct colonoscopy? I don't see any records attached in Epic. I am out of the office this week. If she has hard copy records I will review upon my return to the office next week. Thanks

## 2021-10-01 NOTE — Telephone Encounter (Signed)
Left VM for patient to call and schedule

## 2021-10-01 NOTE — Therapy (Signed)
OUTPATIENT PHYSICAL THERAPY TREATMENT NOTE   Patient Name: Katie Booker MRN: [id number] DOB:1945-08-29, 76 y.o., female Today's Date: 10/01/2021    END OF SESSION:   PT End of Session - 10/01/21 1308     Visit Number 2    Number of Visits 16    Date for PT Re-Evaluation 11/23/21    Authorization Type [employer]    PT Start Time [id number]    PT Stop Time [id number]    PT Time Calculation (min) 38 min             Past Medical History:  Diagnosis Date   Age-related macular degeneration, wet, right eye (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   Depression    DOE (dyspnea on exertion)    GERD    Headache    Hemorrhoids    History of basal cell carcinoma excision    left cheek   History of colon polyps    History of hyperthyroidism 2011   RAI treatement   History of panic attacks    Hyperlipidemia    Hypertension    Hypothyroidism, postradioiodine therapy    followed by pcp   IDA (iron deficiency anemia)    intermittant   Insomnia    OA (osteoarthritis)    OSA (obstructive sleep apnea)    no cpap, per pt tried unable to tolerate   Peripheral neuropathy    legs and feet from hx back surgery's   Rash    bilateral axilla area -- per pt due to some personal wipes used other the counter   RLS (restless legs syndrome)    Spondylolisthesis at L1-L2 level    Tingling of both upper extremities    left greater than right due to cervical pinched nerve   Type 2 diabetes mellitus ([place])    followed by pcp   Umbilical hernia    Urinary frequency    Urinary urgency    Past Surgical History:  Procedure Laterality Date   ANTERIOR CERVICAL DECOMP/DISCECTOMY FUSION N/A 09/02/2014   Procedure: Cervical five- six  Anterior cervical decompression fusion;  Surgeon: Kristeen Miss, MD;  Location: [facility];  Service:  Neurosurgery;  Laterality: N/A;  C5-6 Anterior cervical decompression/diskectomy/fusion   BREAST BIOPSY Right 10/2018   x 2 benign   CARPAL TUNNEL RELEASE Right 2016   CATARACT EXTRACTION W/ INTRAOCULAR LENS  IMPLANT, BILATERAL  03/2015   COLONOSCOPY  2007   ESOPHAGOGASTRODUODENOSCOPY  2007   HEMORRHOID SURGERY  03/23/2011   Procedure: HEMORRHOIDECTOMY;  Surgeon: Joyice Faster. Cornett, MD;  Location: [facility];  Service: General;  Laterality: N/A;  lateral internal sphincterotomy and hemorrhoidectomy   LUMBAR FUSION  02/2015   L1 -- 2   PARTIAL KNEE ARTHROPLASTY Left 12/27/2017   Procedure: UNICOMPARTMENTAL LEFT KNEE;  Surgeon: Marchia Bond, MD;  Location: [facility];  Service: Orthopedics;  Laterality: Left;   THORACIC DISCECTOMY N/A 12/15/2012   Procedure: Thoracic ten-eleven Thoracic laminectomy;  Surgeon: Kristeen Miss, MD;  Location: [facility];  Service: Neurosurgery;  Laterality: N/A;  Thoracic ten-eleven Thoracic laminectomy   TUBAL LIGATION Bilateral 1991   Patient Active Problem List   Diagnosis Date Noted   Morbid obesity ([place]) 01/13/2018   S/P knee replacement  12/27/2017   Spondylolisthesis at L1-L2 level 03/18/2015   Spondylosis with myelopathy 09/02/2014   Cervical spondylosis with myelopathy 09/02/2014   Anxiety 12/21/2012   Chronic back pain 12/21/2012   Insomnia 12/21/2012   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, L2-5 12/15/2012   Spinal stenosis, lumbar region, with neurogenic claudication 10/26/2012   Spinal stenosis, thoracic 10/26/2012   Umbilical hernia 02/25/2012   OSA (obstructive sleep apnea) 11/12/2011   Exogenous obesity 11/12/2011   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   Hypothyroidism 09/18/2009   Diabetes mellitus without complication (HCC) 09/18/2009   Dyslipidemia 09/18/2009   Anemia 09/18/2009   Depression 09/18/2009   Essential hypertension 09/18/2009   GERD 09/18/2009   Primary localized osteoarthritis of left knee 09/18/2009    PCP: Philip Aspen, Minerva Ends   REFERRING PROVIDER: Rodolph Bong, MD     REFERRING DIAG: [phone] (ICD-10-CM) - Pain of left heel   Rationale for Evaluation and Treatment Rehabilitation   THERAPY DIAG:  Bilateral hip pain   Other low back pain   Acute left ankle pain   Muscle weakness (generalized)   Difficulty in walking, not elsewhere classified   ONSET DATE: years   SUBJECTIVE:                                                                                                                                                                                            SUBJECTIVE STATEMENT: 10/01/2021 States she has been tired but otherwise feels good. States she has a lot of swelling in her left leg.  Eval: States she has had chronic back pain. States her last fusion was in 2017 and she was still having pain above and below her previous fusions. States MD would not recommend additional surgery at this time secondary to high risk of infection. States that her pain is increasing and that it is going up her back and into her neck. States she is worried she is getting osteoporosis. States she takes medication every 6 hours for her pain, and continues to have pain before the 6 hours comes.    States she is miserable as she is in constant pain. States then her foot injury happened when she twisted her foot to get her cat off her leg. State she has been having swelling in her left ankle   States that  she feels her balance is getting worse. States she can't cook and stand in the kitchen. States she can't visit with family anymore like she would like to.    States she has been using the walker since her first back surgery.      PERTINENT HISTORY:  left heel  pain and Chronic back and hip pain, x2 lumbar fusions L1-5 SP (last fusion 02/2015), cervical surgery, R carpal tunnel, osteopenia, neck pain   PAIN:  Are you having pain? Yes: NPRS scale: 2/10 Pain location: left foot, B low  back/hip R>L, piriformis muscles Pain description: dull achy Aggravating factors: unsure Relieving factors: unsure      PRECAUTIONS: Fall   WEIGHT BEARING RESTRICTIONS No   FALLS:  Has patient fallen in last 6 months? Yes. Number of falls 4 last one a couple weeks ago   LIVING ENVIRONMENT: Lives with: lives alone Lives in: House/apartment Stairs: No Has following equipment at home: Quad cane small base and , grab bars in bath room, Rolator,    OCCUPATION: not working   PLOF: Independent with household mobility with device, Independent with community mobility with device, and Needs assistance with homemaking   PATIENT GOALS to improve her balance if possible      OBJECTIVE:    DIAGNOSTIC FINDINGS:  09/14/21 US  Impression: Achilles tendinitis and peritendinitis.  Retrocalcaneal bursitis       SCREENING FOR RED FLAGS: Bowel or bladder incontinence: No Spinal tumors: No Cauda equina syndrome: No Compression fracture: No Abdominal aneurysm: No   COGNITION:           Overall cognitive status: Within functional limits for tasks assessed                          SENSATION: Not tested       POSTURE: rounded shoulders, forward head, and flexed trunk , swelling noted throughout left ankle and calf   Balance: Narrow BOS no UE support 27 seconds, unable to stand on either leg   LUMBAR ROM: - not formally tested on this date   Active  A/PROM  eval  Flexion    Extension    Right lateral flexion    Left lateral flexion    Right rotation    Left rotation     (Blank rows = not tested)                  LE Measurements       Lower Extremity Right 09/28/2021 Left 09/28/2021    A/PROM MMT A/PROM MMT  Hip Flexion   4   4  Hip Extension          Hip Abduction          Hip Adduction          Hip Internal rotation          Hip External rotation          Knee Flexion   4-   3+*  Knee Extension   4-   3+*  Ankle Dorsiflexion          Ankle Plantarflexion           Ankle Inversion          Ankle Eversion           (Blank rows = not tested)            * pain     LUMBAR SPECIAL TESTS:  Neg slump test B   FUNCTIONAL TESTS:  STS - uses hands to stand up and sit down   GAIT: Distance walked: 50 feet Assistive device utilized:  rollator Level of assistance: SBA Comments: wide base of support, hips ER, slower gait, unsteady, trendelenburg B, antalgic left ankle  TODAY'S TREATMENT  10/01/2021  Therapeutic Exercise:    Aerobic: Supine: Prone:    Seated: LAQS x10 B 5" holds,     Standing: Neuromuscular Re-education: narrow BOS at counter reviewed Manual Therapy: Therapeutic Activity:STS mechanics 30 minutes from elevated seat (blue cushion Self Care: Trigger Point Dry Needling:  Modalities:      PATIENT EDUCATION:  Education details: on falling and fear of falling, on STS mechanics, on anatomy and fall mechanics Person educated: Patient Education method: Explanation, Demonstration, and Handouts Education comprehension: verbalized understanding     HOME EXERCISE PROGRAM: [email or web address]   ASSESSMENT:   CLINICAL IMPRESSION: 10/01/2021 Session focused on sit to stand mechanics and weight shifting onto feet/toes as patient tends to keep weight in heels and fall. Educated patient on importance of shifting weight and starting to pay attention to where her weight is with standing and sitting. Patient got scared with some exercises and had a slight loss of balance but reassured patient with exercise, did not add this exercise to HEP secondary to safety but added forward reaches with walker with patient sitting to work on forward weight shift. Will continue with current POC as tolerated.  Eval: Patient presents with chronic pain in back, hips and legs as well as balance difficulties. Session focused on education and current presentation as well as plan moving forward. Patient would greatly benefit from skilled PT to improve overall function, QOL  and reduce risk of falling.     OBJECTIVE IMPAIRMENTS Abnormal gait, decreased activity tolerance, decreased balance, decreased mobility, difficulty walking, decreased ROM, decreased strength, postural dysfunction, and pain.    ACTIVITY LIMITATIONS bending, standing, stairs, transfers, bed mobility, bathing, and locomotion level   PARTICIPATION LIMITATIONS: meal prep, cleaning, and community activity   PERSONAL FACTORS Age, Fitness, and 3+ comorbidities: x2 lumbar fusions, chronic pain, lives a lone, cervical surgery  are also affecting patient's functional outcome.    REHAB POTENTIAL: Good   CLINICAL DECISION MAKING: Evolving/moderate complexity   EVALUATION COMPLEXITY: Moderate       GOALS: Goals reviewed with patient?  yes   SHORT TERM GOALS:   Patient will be independent in self management strategies to improve quality of life and functional outcomes. Baseline: new program Target date: 10/26/2021 Goal status: INITIAL   2.  Patient will report at least 25% improvement in overall symptoms and/or function to demonstrate improved functional mobility Baseline: 0% Target date: 10/26/2021 Goal status: INITIAL   3.  Patient will be able to balance for at least 45 seconds in narrow BOS without UE support to demonstrate improved static balance. Baseline: see above Target date: 10/26/2021 Goal status: INITIAL           LONG TERM GOALS:   Patient will report at least 50% improvement in overall symptoms and/or function to demonstrate improved functional mobility Baseline: 0% Target date: 11/23/2021 Goal status: INITIAL   2.  Patient will be able to stand for at least 15 minutes to improve functional endurance with standing to prepare a meal Baseline: unable Target date: 11/23/2021 Goal status: INITIAL   3.  Patient will be able to balance in semi tandem for at least 15 seconds with either leg forward to demonstrate improved static balance Baseline: unable Target date:  11/23/2021 Goal status: INITIAL       PLAN: PT FREQUENCY: 2x/week   PT DURATION: 8 weeks   PLANNED INTERVENTIONS: Therapeutic exercises, Therapeutic activity, Neuromuscular re-education, Balance training, Gait training, Patient/Family education, Self Care, Joint mobilization, Joint manipulation, Stair training,  Orthotic/Fit training, Aquatic Therapy, Dry Needling, Electrical stimulation, Cryotherapy, Moist heat, Vasopneumatic device, Traction, Ionotophoresis 4mg /ml Dexamethasone, and Manual therapy.   PLAN FOR NEXT SESSION: BERG test, LE strength and balance, ROM as needed, manual as indicated. - consider aquatics if land based therapy not helpful.      1:46 PM, 10/01/21 Jerene Pitch, DPT Physical Therapy with [facility]

## 2021-10-05 ENCOUNTER — Encounter: Payer: Self-pay | Admitting: Physical Therapy

## 2021-10-05 ENCOUNTER — Ambulatory Visit: Payer: Medicare Other | Admitting: Physical Therapy

## 2021-10-05 DIAGNOSIS — M25572 Pain in left ankle and joints of left foot: Secondary | ICD-10-CM

## 2021-10-05 DIAGNOSIS — M25551 Pain in right hip: Secondary | ICD-10-CM

## 2021-10-05 DIAGNOSIS — M5459 Other low back pain: Secondary | ICD-10-CM

## 2021-10-05 DIAGNOSIS — M6281 Muscle weakness (generalized): Secondary | ICD-10-CM | POA: Diagnosis not present

## 2021-10-05 DIAGNOSIS — M25552 Pain in left hip: Secondary | ICD-10-CM

## 2021-10-05 DIAGNOSIS — R262 Difficulty in walking, not elsewhere classified: Secondary | ICD-10-CM

## 2021-10-05 NOTE — Therapy (Signed)
OUTPATIENT PHYSICAL THERAPY TREATMENT NOTE   Patient Name: Katie Booker MRN: [id number] DOB:Aug 17, 1945, 76 y.o., female Today's Date: 10/05/2021    END OF SESSION:   PT End of Session - 10/05/21 1437     Visit Number 3    Number of Visits 16    Date for PT Re-Evaluation 11/23/21    Authorization Type [employer]    PT Start Time 1436    PT Stop Time 1520    PT Time Calculation (min) 44 min             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   Depression    DOE (dyspnea on exertion)    GERD    Headache    Hemorrhoids    History of basal cell carcinoma excision    left cheek   History of colon polyps    History of hyperthyroidism 2011   RAI treatement   History of panic attacks    Hyperlipidemia    Hypertension    Hypothyroidism, postradioiodine therapy    followed by pcp   IDA (iron deficiency anemia)    intermittant   Insomnia    OA (osteoarthritis)    OSA (obstructive sleep apnea)    no cpap, per pt tried unable to tolerate   Peripheral neuropathy    legs and feet from hx back surgery's   Rash    bilateral axilla area -- per pt due to some personal wipes used other the counter   RLS (restless legs syndrome)    Spondylolisthesis at L1-L2 level    Tingling of both upper extremities    left greater than right due to cervical pinched nerve   Type 2 diabetes mellitus (HCC)    followed by pcp   Umbilical hernia    Urinary frequency    Urinary urgency    Past Surgical History:  Procedure Laterality Date   ANTERIOR CERVICAL DECOMP/DISCECTOMY FUSION N/A 09/02/2014   Procedure: Cervical five- six  Anterior cervical decompression fusion;  Surgeon: Barnett AbuHenry Elsner, MD;  Location: [facility];  Service:  Neurosurgery;  Laterality: N/A;  C5-6 Anterior cervical decompression/diskectomy/fusion   BREAST BIOPSY Right 10/2018   x 2 benign   CARPAL TUNNEL RELEASE Right 2016   CATARACT EXTRACTION W/ INTRAOCULAR LENS  IMPLANT, BILATERAL  03/2015   COLONOSCOPY  2007   ESOPHAGOGASTRODUODENOSCOPY  2007   HEMORRHOID SURGERY  03/23/2011   Procedure: HEMORRHOIDECTOMY;  Surgeon: Clovis Puhomas A. Cornett, MD;  Location: [facility];  Service: General;  Laterality: N/A;  lateral internal sphincterotomy and hemorrhoidectomy   LUMBAR FUSION  02/2015   L1 -- 2   PARTIAL KNEE ARTHROPLASTY Left 12/27/2017   Procedure: UNICOMPARTMENTAL LEFT KNEE;  Surgeon: Teryl LucyLandau, Joshua, MD;  Location: [facility];  Service: Orthopedics;  Laterality: Left;   THORACIC DISCECTOMY N/A 12/15/2012   Procedure: Thoracic ten-eleven Thoracic laminectomy;  Surgeon: Barnett AbuHenry Elsner, MD;  Location: [facility];  Service: Neurosurgery;  Laterality: N/A;  Thoracic ten-eleven Thoracic laminectomy   TUBAL LIGATION Bilateral 1991   Patient Active Problem List   Diagnosis Date Noted   Morbid obesity (HCC) 01/13/2018   S/P knee replacement  12/27/2017   Spondylolisthesis at L1-L2 level 03/18/2015   Spondylosis with myelopathy 09/02/2014   Cervical spondylosis with myelopathy 09/02/2014   Anxiety 12/21/2012   Chronic back pain 12/21/2012   Insomnia 12/21/2012   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, L2-5 12/15/2012   Spinal stenosis, lumbar region, with neurogenic claudication 10/26/2012   Spinal stenosis, thoracic 10/26/2012   Umbilical hernia 02/25/2012   OSA (obstructive sleep apnea) 11/12/2011   Exogenous obesity 11/12/2011   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   Hypothyroidism 09/18/2009   Diabetes mellitus without complication (HCC) 09/18/2009   Dyslipidemia 09/18/2009   Anemia 09/18/2009   Depression 09/18/2009   Essential hypertension 09/18/2009   GERD 09/18/2009   Primary localized osteoarthritis of left knee 09/18/2009    PCP: Philip Aspen, Minerva Ends   REFERRING PROVIDER: Rodolph Bong, MD     REFERRING DIAG: [phone] (ICD-10-CM) - Pain of left heel   Rationale for Evaluation and Treatment Rehabilitation   THERAPY DIAG:  Bilateral hip pain   Other low back pain   Acute left ankle pain   Muscle weakness (generalized)   Difficulty in walking, not elsewhere classified   ONSET DATE: years   SUBJECTIVE:                                                                                                                                                                                            SUBJECTIVE STATEMENT: 10/05/2021 States she has been having increased swelling and is considering following up with Dr. Denyse Amass sooner than later.  Eval: States she has had chronic back pain. States her last fusion was in 2017 and she was still having pain above and below her previous fusions. States MD would not recommend additional surgery at this time secondary to high risk of infection. States that her pain is increasing and that it is going up her back and into her neck. States she is worried she is getting osteoporosis. States she takes medication every 6 hours for her pain, and continues to have pain before the 6 hours comes.    States she is miserable as she is in constant pain. States then her foot injury happened when she twisted her foot to get her cat off her leg. State she has been having swelling in her left ankle   States that  she feels her balance is getting worse. States she can't cook and stand in the kitchen. States she can't visit with family anymore like she would like to.    States she has been using the walker since her first back surgery.      PERTINENT HISTORY:  left heel pain and  Chronic back and hip pain, x2 lumbar fusions L1-5 SP (last fusion 02/2015), cervical surgery, R carpal tunnel, osteopenia, neck pain   PAIN:  Are you having pain? Yes: NPRS scale: 2/10 Pain location: left foot, B  low back/hip R>L, piriformis muscles Pain description: dull achy Aggravating factors: unsure Relieving factors: unsure      PRECAUTIONS: Fall   WEIGHT BEARING RESTRICTIONS No   FALLS:  Has patient fallen in last 6 months? Yes. Number of falls 4 last one a couple weeks ago   LIVING ENVIRONMENT: Lives with: lives alone Lives in: House/apartment Stairs: No Has following equipment at home: Quad cane small base and , grab bars in bath room, Rolator,    OCCUPATION: not working   PLOF: Independent with household mobility with device, Independent with community mobility with device, and Needs assistance with homemaking   PATIENT GOALS to improve her balance if possible      OBJECTIVE:    DIAGNOSTIC FINDINGS:  09/14/21 US  Impression: Achilles tendinitis and peritendinitis.  Retrocalcaneal bursitis       SCREENING FOR RED FLAGS: Bowel or bladder incontinence: No Spinal tumors: No Cauda equina syndrome: No Compression fracture: No Abdominal aneurysm: No   COGNITION:           Overall cognitive status: Within functional limits for tasks assessed                          SENSATION: Not tested       POSTURE: rounded shoulders, forward head, and flexed trunk , swelling noted throughout left ankle and calf   Balance: Narrow BOS no UE support 27 seconds, unable to stand on either leg   LUMBAR ROM: - not formally tested on this date   Active  A/PROM  eval  Flexion    Extension    Right lateral flexion    Left lateral flexion    Right rotation    Left rotation     (Blank rows = not tested)                  LE Measurements       Lower Extremity Right 09/28/2021 Left 09/28/2021    A/PROM MMT A/PROM MMT  Hip Flexion   4   4  Hip Extension          Hip Abduction          Hip Adduction          Hip Internal rotation          Hip External rotation          Knee Flexion   4-   3+*  Knee Extension   4-   3+*  Ankle Dorsiflexion          Ankle Plantarflexion           Ankle Inversion          Ankle Eversion           (Blank rows = not tested)            * pain     LUMBAR SPECIAL TESTS:  Neg slump test B   FUNCTIONAL TESTS:  STS - uses hands to stand up and sit down   GAIT: Distance walked: 50 feet Assistive device utilized:  rollator Level of assistance: SBA Comments: wide base of support, hips ER, slower gait, unsteady, trendelenburg B, antalgic left ankle  TODAY'S TREATMENT  10/05/2021 Therapeutic Exercise:    Aerobic: Supine: trailed supine with legs elevated - poorly tolerated - pain in her back and difficulty breathing.  Prone:    Seated: LAQS x10 B 5" holds,  ankle pumps x30 B    Standing: walking with Rolator - 5 minutes Neuromuscular Re-education  Manual Therapy: edema massage to right LE  Therapeutic Activity:Self Care: Trigger Point Dry Needling:  Modalities:      PATIENT EDUCATION:  Education details: on compression garments, on importance of elevation, ankle pumps and managing swelling. On following up with MD Person educated: Patient Education method: Explanation, Demonstration, and Handouts Education comprehension: verbalized understanding     HOME EXERCISE PROGRAM: [email or web address]   ASSESSMENT:   CLINICAL IMPRESSION: 10/05/2021 Difficulty with supine position secondary to lumbar pain and difficulty breathing. Performed seated elevation and this was also tolerated poorly. Increased pain and swelling throughout foot noted on this date. Overall patient with poor tolerance to interventions with ankle pumps causing increased pain. Encouraged patient to bring in compression garments next session to trial donning/doffing them in clinic. Will continue with current POC as tolerated.    Eval: Patient presents with chronic pain in back, hips and legs as well as balance difficulties. Session focused on education and current presentation as well as plan moving forward. Patient would greatly benefit from skilled PT to improve  overall function, QOL and reduce risk of falling.     OBJECTIVE IMPAIRMENTS Abnormal gait, decreased activity tolerance, decreased balance, decreased mobility, difficulty walking, decreased ROM, decreased strength, postural dysfunction, and pain.    ACTIVITY LIMITATIONS bending, standing, stairs, transfers, bed mobility, bathing, and locomotion level   PARTICIPATION LIMITATIONS: meal prep, cleaning, and community activity   PERSONAL FACTORS Age, Fitness, and 3+ comorbidities: x2 lumbar fusions, chronic pain, lives a lone, cervical surgery  are also affecting patient's functional outcome.    REHAB POTENTIAL: Good   CLINICAL DECISION MAKING: Evolving/moderate complexity   EVALUATION COMPLEXITY: Moderate       GOALS: Goals reviewed with patient?  yes   SHORT TERM GOALS:   Patient will be independent in self management strategies to improve quality of life and functional outcomes. Baseline: new program Target date: 10/26/2021 Goal status: INITIAL   2.  Patient will report at least 25% improvement in overall symptoms and/or function to demonstrate improved functional mobility Baseline: 0% Target date: 10/26/2021 Goal status: INITIAL   3.  Patient will be able to balance for at least 45 seconds in narrow BOS without UE support to demonstrate improved static balance. Baseline: see above Target date: 10/26/2021 Goal status: INITIAL           LONG TERM GOALS:   Patient will report at least 50% improvement in overall symptoms and/or function to demonstrate improved functional mobility Baseline: 0% Target date: 11/23/2021 Goal status: INITIAL   2.  Patient will be able to stand for at least 15 minutes to improve functional endurance with standing to prepare a meal Baseline: unable Target date: 11/23/2021 Goal status: INITIAL   3.  Patient will be able to balance in semi tandem for at least 15 seconds with either leg forward to demonstrate improved static balance Baseline:  unable Target date: 11/23/2021 Goal status: INITIAL       PLAN: PT FREQUENCY: 2x/week   PT DURATION: 8 weeks   PLANNED INTERVENTIONS: Therapeutic exercises, Therapeutic activity, Neuromuscular re-education, Balance training, Gait training, Patient/Family education, Self Care, Joint mobilization, Joint manipulation, Stair training, Orthotic/Fit training, Aquatic Therapy,  Dry Needling, Electrical stimulation, Cryotherapy, Moist heat, Vasopneumatic device, Traction, Ionotophoresis 4mg /ml Dexamethasone, and Manual therapy.   PLAN FOR NEXT SESSION: BERG test, LE strength and balance, ROM as needed, manual as indicated. - consider aquatics if land based therapy not helpful.      3:26 PM, 10/05/21 10/07/21, DPT Physical Therapy with [facility]

## 2021-10-06 ENCOUNTER — Encounter: Payer: Self-pay | Admitting: Gastroenterology

## 2021-10-06 NOTE — Telephone Encounter (Signed)
PT scheduled direct colonoscopy for 11/24/2021 3 PM and PV 10/29/2021 4PM

## 2021-10-08 ENCOUNTER — Encounter: Payer: Medicare Other | Admitting: Physical Therapy

## 2021-10-12 ENCOUNTER — Encounter: Payer: Self-pay | Admitting: Physical Therapy

## 2021-10-12 ENCOUNTER — Ambulatory Visit: Payer: Medicare Other | Admitting: Physical Therapy

## 2021-10-12 DIAGNOSIS — M25552 Pain in left hip: Secondary | ICD-10-CM

## 2021-10-12 DIAGNOSIS — M25551 Pain in right hip: Secondary | ICD-10-CM

## 2021-10-12 DIAGNOSIS — M25572 Pain in left ankle and joints of left foot: Secondary | ICD-10-CM

## 2021-10-12 DIAGNOSIS — R262 Difficulty in walking, not elsewhere classified: Secondary | ICD-10-CM

## 2021-10-12 DIAGNOSIS — M5459 Other low back pain: Secondary | ICD-10-CM | POA: Diagnosis not present

## 2021-10-12 DIAGNOSIS — M6281 Muscle weakness (generalized): Secondary | ICD-10-CM | POA: Diagnosis not present

## 2021-10-12 NOTE — Therapy (Signed)
OUTPATIENT PHYSICAL THERAPY TREATMENT NOTE   Patient Name: Katie Booker MRN: [id number] DOB:May 11, 1945, 76 y.o., female Today's Date: 10/12/2021    END OF SESSION:   PT End of Session - 10/12/21 1436     Visit Number 4    Number of Visits 16    Date for PT Re-Evaluation 11/23/21    Authorization Type [employer]    PT Start Time 1436    PT Stop Time 1514    PT Time Calculation (min) 38 min             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   Depression    DOE (dyspnea on exertion)    GERD    Headache    Hemorrhoids    History of basal cell carcinoma excision    left cheek   History of colon polyps    History of hyperthyroidism 2011   RAI treatement   History of panic attacks    Hyperlipidemia    Hypertension    Hypothyroidism, postradioiodine therapy    followed by pcp   IDA (iron deficiency anemia)    intermittant   Insomnia    OA (osteoarthritis)    OSA (obstructive sleep apnea)    no cpap, per pt tried unable to tolerate   Peripheral neuropathy    legs and feet from hx back surgery's   Rash    bilateral axilla area -- per pt due to some personal wipes used other the counter   RLS (restless legs syndrome)    Spondylolisthesis at L1-L2 level    Tingling of both upper extremities    left greater than right due to cervical pinched nerve   Type 2 diabetes mellitus (HCC)    followed by pcp   Umbilical hernia    Urinary frequency    Urinary urgency    Past Surgical History:  Procedure Laterality Date   ANTERIOR CERVICAL DECOMP/DISCECTOMY FUSION N/A 09/02/2014   Procedure: Cervical five- six  Anterior cervical decompression fusion;  Surgeon: Barnett Abu, MD;  Location: [facility];  Service:  Neurosurgery;  Laterality: N/A;  C5-6 Anterior cervical decompression/diskectomy/fusion   BREAST BIOPSY Right 10/2018   x 2 benign   CARPAL TUNNEL RELEASE Right 2016   CATARACT EXTRACTION W/ INTRAOCULAR LENS  IMPLANT, BILATERAL  03/2015   COLONOSCOPY  2007   ESOPHAGOGASTRODUODENOSCOPY  2007   HEMORRHOID SURGERY  03/23/2011   Procedure: HEMORRHOIDECTOMY;  Surgeon: Clovis Pu. Cornett, MD;  Location: [facility];  Service: General;  Laterality: N/A;  lateral internal sphincterotomy and hemorrhoidectomy   LUMBAR FUSION  02/2015   L1 -- 2   PARTIAL KNEE ARTHROPLASTY Left 12/27/2017   Procedure: UNICOMPARTMENTAL LEFT KNEE;  Surgeon: Teryl Lucy, MD;  Location: [facility];  Service: Orthopedics;  Laterality: Left;   THORACIC DISCECTOMY N/A 12/15/2012   Procedure: Thoracic ten-eleven Thoracic laminectomy;  Surgeon: Barnett Abu, MD;  Location: [facility];  Service: Neurosurgery;  Laterality: N/A;  Thoracic ten-eleven Thoracic laminectomy   TUBAL LIGATION Bilateral 1991   Patient Active Problem List   Diagnosis Date Noted   Morbid obesity (HCC) 01/13/2018   S/P knee replacement  12/27/2017   Spondylolisthesis at L1-L2 level 03/18/2015   Spondylosis with myelopathy 09/02/2014   Cervical spondylosis with myelopathy 09/02/2014   Anxiety 12/21/2012   Chronic back pain 12/21/2012   Insomnia 12/21/2012   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, L2-5 12/15/2012   Spinal stenosis, lumbar region, with neurogenic claudication 10/26/2012   Spinal stenosis, thoracic 10/26/2012   Umbilical hernia 02/25/2012   OSA (obstructive sleep apnea) 11/12/2011   Exogenous obesity 11/12/2011   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   Hypothyroidism 09/18/2009   Diabetes mellitus without complication (HCC) 09/18/2009   Dyslipidemia 09/18/2009   Anemia 09/18/2009   Depression 09/18/2009   Essential hypertension 09/18/2009   GERD 09/18/2009   Primary localized osteoarthritis of left knee 09/18/2009    PCP: Philip AspenHernandez Acosta, Minerva EndsEstela   REFERRING PROVIDER: Rodolph Bongorey, Evan S, MD     REFERRING DIAG: [phone] (ICD-10-CM) - Pain of left heel   Rationale for Evaluation and Treatment Rehabilitation   THERAPY DIAG:  Bilateral hip pain   Other low back pain   Acute left ankle pain   Muscle weakness (generalized)   Difficulty in walking, not elsewhere classified   ONSET DATE: years   SUBJECTIVE:                                                                                                                                                                                            SUBJECTIVE STATEMENT: 10/12/2021 States she hasn't been sleeping well and has been really stressed and has been having a lot of home life stresses. States she thinks she overdid her ankle pumps the other day as she had pain on the top of her feet  Eval: States she has had chronic back pain. States her last fusion was in 2017 and she was still having pain above and below her previous fusions. States MD would not recommend additional surgery at this time secondary to high risk of infection. States that her pain is increasing and that it is going up her back and into her neck. States she is worried she is getting osteoporosis. States she takes medication every 6 hours for her pain, and continues to have pain before the 6 hours comes.    States she is miserable as she is in constant pain. States then her foot injury happened when she twisted her foot to get her cat off her leg. State she has been having swelling in her left ankle   States that  she feels her balance is getting worse. States she can't cook and stand in the kitchen. States she can't visit with family anymore like she would like to.  States she has been using the walker since her first back surgery.      PERTINENT HISTORY:  left heel pain and Chronic back and hip pain, x2 lumbar fusions L1-5 SP (last fusion 02/2015), cervical surgery, R carpal tunnel,  osteopenia, neck pain   PAIN:  Are you having pain? no: NPRS scale: 0/10 Pain location: left foot, B low back/hip R>L, piriformis muscles Pain description: dull achy Aggravating factors: unsure Relieving factors: unsure      PRECAUTIONS: Fall   WEIGHT BEARING RESTRICTIONS No   FALLS:  Has patient fallen in last 6 months? Yes. Number of falls 4 last one a couple weeks ago   LIVING ENVIRONMENT: Lives with: lives alone Lives in: House/apartment Stairs: No Has following equipment at home: Quad cane small base and , grab bars in bath room, Rolator,    OCCUPATION: not working   PLOF: Independent with household mobility with device, Independent with community mobility with device, and Needs assistance with homemaking   PATIENT GOALS to improve her balance if possible      OBJECTIVE:    DIAGNOSTIC FINDINGS:  09/14/21 US  Impression: Achilles tendinitis and peritendinitis.  Retrocalcaneal bursitis       SCREENING FOR RED FLAGS: Bowel or bladder incontinence: No Spinal tumors: No Cauda equina syndrome: No Compression fracture: No Abdominal aneurysm: No   COGNITION:           Overall cognitive status: Within functional limits for tasks assessed                          SENSATION: Not tested       POSTURE: rounded shoulders, forward head, and flexed trunk , swelling noted throughout left ankle and calf   Balance: Narrow BOS no UE support 27 seconds, unable to stand on either leg   LUMBAR ROM: - not formally tested on this date   Active  A/PROM  eval  Flexion    Extension    Right lateral flexion    Left lateral flexion    Right rotation    Left rotation     (Blank rows = not tested)                  LE Measurements       Lower Extremity Right 09/28/2021 Left 09/28/2021    A/PROM MMT A/PROM MMT  Hip Flexion   4   4  Hip Extension          Hip Abduction          Hip Adduction          Hip Internal rotation          Hip External rotation          Knee  Flexion   4-   3+*  Knee Extension   4-   3+*  Ankle Dorsiflexion          Ankle Plantarflexion          Ankle Inversion          Ankle Eversion           (Blank rows = not tested)            * pain     LUMBAR SPECIAL TESTS:  Neg slump test B   FUNCTIONAL TESTS:  STS - uses hands to stand up and sit down   GAIT: Distance walked: 50 feet Assistive device utilized:  rollator Level  of assistance: SBA Comments: wide base of support, hips ER, slower gait, unsteady, trendelenburg B, antalgic left ankle        TODAY'S TREATMENT  10/12/2021 Therapeutic Exercise:    Aerobic: Supine: trailed supine with legs elevated - poorly tolerated - pain in her back and difficulty breathing.  Prone:    Seated: LAQS x10 B 5" holds,  ankle pumps x30 B    Standing: walking with Rolator - 5 minutes Neuromuscular Re-education  Manual Therapy: edema massage to right LE  Therapeutic Activity:Self Care: Trigger Point Dry Needling:  Modalities:      PATIENT EDUCATION:  Education details: on reassurance with current stresses, on HEP, on helping to progress interventions as able, on following up with her MD about referral to behavorial health for order for therapist, on stress management strategies, on how to don and doff compression socks. Practiced in clinic Person educated: Patient Education method: Explanation, Demonstration, and Handouts Education comprehension: verbalized understanding     HOME EXERCISE PROGRAM: [id number]   ASSESSMENT:   CLINICAL IMPRESSION: 10/12/2021 Session limited secondary to patient stressed and concerned about home life issues, difficult to redirect patient to functional tasks. Tried to redirect to therapeutic interventions 4 times without success. Focused on education about benefits of following up with behavorial health specialist. Educated patient on how to don/doff compression garments and donned in clinic with verbal cues. Patient reported comfort after socks  donned. Difficult for patient to get socks on but able to do without physical assist.    Eval: Patient presents with chronic pain in back, hips and legs as well as balance difficulties. Session focused on education and current presentation as well as plan moving forward. Patient would greatly benefit from skilled PT to improve overall function, QOL and reduce risk of falling.     OBJECTIVE IMPAIRMENTS Abnormal gait, decreased activity tolerance, decreased balance, decreased mobility, difficulty walking, decreased ROM, decreased strength, postural dysfunction, and pain.    ACTIVITY LIMITATIONS bending, standing, stairs, transfers, bed mobility, bathing, and locomotion level   PARTICIPATION LIMITATIONS: meal prep, cleaning, and community activity   PERSONAL FACTORS Age, Fitness, and 3+ comorbidities: x2 lumbar fusions, chronic pain, lives a lone, cervical surgery  are also affecting patient's functional outcome.    REHAB POTENTIAL: Good   CLINICAL DECISION MAKING: Evolving/moderate complexity   EVALUATION COMPLEXITY: Moderate       GOALS: Goals reviewed with patient?  yes   SHORT TERM GOALS:   Patient will be independent in self management strategies to improve quality of life and functional outcomes. Baseline: new program Target date: 10/26/2021 Goal status: INITIAL   2.  Patient will report at least 25% improvement in overall symptoms and/or function to demonstrate improved functional mobility Baseline: 0% Target date: 10/26/2021 Goal status: INITIAL   3.  Patient will be able to balance for at least 45 seconds in narrow BOS without UE support to demonstrate improved static balance. Baseline: see above Target date: 10/26/2021 Goal status: INITIAL           LONG TERM GOALS:   Patient will report at least 50% improvement in overall symptoms and/or function to demonstrate improved functional mobility Baseline: 0% Target date: 11/23/2021 Goal status: INITIAL   2.  Patient  will be able to stand for at least 15 minutes to improve functional endurance with standing to prepare a meal Baseline: unable Target date: 11/23/2021 Goal status: INITIAL   3.  Patient will be able to balance in semi tandem for at least  15 seconds with either leg forward to demonstrate improved static balance Baseline: unable Target date: 11/23/2021 Goal status: INITIAL       PLAN: PT FREQUENCY: 2x/week   PT DURATION: 8 weeks   PLANNED INTERVENTIONS: Therapeutic exercises, Therapeutic activity, Neuromuscular re-education, Balance training, Gait training, Patient/Family education, Self Care, Joint mobilization, Joint manipulation, Stair training, Orthotic/Fit training, Aquatic Therapy, Dry Needling, Electrical stimulation, Cryotherapy, Moist heat, Vasopneumatic device, Traction, Ionotophoresis 4mg /ml Dexamethasone, and Manual therapy.   PLAN FOR NEXT SESSION: DOES NOT TOLERATE SUPINE OR LAYING DOWN POSITIONS, redirect for any functional exercises or balance exercises.      3:23 PM, 10/12/21 10/14/21, DPT Physical Therapy with [facility]

## 2021-10-14 ENCOUNTER — Encounter: Payer: Self-pay | Admitting: Physical Therapy

## 2021-10-14 ENCOUNTER — Ambulatory Visit: Payer: Medicare Other | Admitting: Physical Therapy

## 2021-10-14 DIAGNOSIS — M6281 Muscle weakness (generalized): Secondary | ICD-10-CM

## 2021-10-14 DIAGNOSIS — M25552 Pain in left hip: Secondary | ICD-10-CM | POA: Diagnosis not present

## 2021-10-14 DIAGNOSIS — R262 Difficulty in walking, not elsewhere classified: Secondary | ICD-10-CM | POA: Diagnosis not present

## 2021-10-14 DIAGNOSIS — M5459 Other low back pain: Secondary | ICD-10-CM | POA: Diagnosis not present

## 2021-10-14 DIAGNOSIS — M25551 Pain in right hip: Secondary | ICD-10-CM

## 2021-10-14 DIAGNOSIS — M25572 Pain in left ankle and joints of left foot: Secondary | ICD-10-CM | POA: Diagnosis not present

## 2021-10-14 NOTE — Therapy (Signed)
OUTPATIENT PHYSICAL THERAPY TREATMENT NOTE   Patient Name: Katie Booker MRN: [id number] DOB:12/13/1945, 76 y.o., female Today's Date: 10/14/2021    END OF SESSION:   PT End of Session - 10/14/21 1443     Visit Number 5    Number of Visits 16    Date for PT Re-Evaluation 11/23/21    Authorization Type [employer]    PT Start Time 1433    PT Stop Time 1513    PT Time Calculation (min) 40 min             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   Depression    DOE (dyspnea on exertion)    GERD    Headache    Hemorrhoids    History of basal cell carcinoma excision    left cheek   History of colon polyps    History of hyperthyroidism 2011   RAI treatement   History of panic attacks    Hyperlipidemia    Hypertension    Hypothyroidism, postradioiodine therapy    followed by pcp   IDA (iron deficiency anemia)    intermittant   Insomnia    OA (osteoarthritis)    OSA (obstructive sleep apnea)    no cpap, per pt tried unable to tolerate   Peripheral neuropathy    legs and feet from hx back surgery's   Rash    bilateral axilla area -- per pt due to some personal wipes used other the counter   RLS (restless legs syndrome)    Spondylolisthesis at L1-L2 level    Tingling of both upper extremities    left greater than right due to cervical pinched nerve   Type 2 diabetes mellitus (HCC)    followed by pcp   Umbilical hernia    Urinary frequency    Urinary urgency    Past Surgical History:  Procedure Laterality Date   ANTERIOR CERVICAL DECOMP/DISCECTOMY FUSION N/A 09/02/2014   Procedure: Cervical five- six  Anterior cervical decompression fusion;  Surgeon: Barnett Abu, MD;  Location: [facility];  Service:  Neurosurgery;  Laterality: N/A;  C5-6 Anterior cervical decompression/diskectomy/fusion   BREAST BIOPSY Right 10/2018   x 2 benign   CARPAL TUNNEL RELEASE Right 2016   CATARACT EXTRACTION W/ INTRAOCULAR LENS  IMPLANT, BILATERAL  03/2015   COLONOSCOPY  2007   ESOPHAGOGASTRODUODENOSCOPY  2007   HEMORRHOID SURGERY  03/23/2011   Procedure: HEMORRHOIDECTOMY;  Surgeon: Clovis Pu. Cornett, MD;  Location: [facility];  Service: General;  Laterality: N/A;  lateral internal sphincterotomy and hemorrhoidectomy   LUMBAR FUSION  02/2015   L1 -- 2   PARTIAL KNEE ARTHROPLASTY Left 12/27/2017   Procedure: UNICOMPARTMENTAL LEFT KNEE;  Surgeon: Teryl Lucy, MD;  Location: [facility];  Service: Orthopedics;  Laterality: Left;   THORACIC DISCECTOMY N/A 12/15/2012   Procedure: Thoracic ten-eleven Thoracic laminectomy;  Surgeon: Barnett Abu, MD;  Location: [facility];  Service: Neurosurgery;  Laterality: N/A;  Thoracic ten-eleven Thoracic laminectomy   TUBAL LIGATION Bilateral 1991   Patient Active Problem List   Diagnosis Date Noted   Morbid obesity (HCC) 01/13/2018   S/P knee replacement  12/27/2017   Spondylolisthesis at L1-L2 level 03/18/2015   Spondylosis with myelopathy 09/02/2014   Cervical spondylosis with myelopathy 09/02/2014   Anxiety 12/21/2012   Chronic back pain 12/21/2012   Insomnia 12/21/2012   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, L2-5 12/15/2012   Spinal stenosis, lumbar region, with neurogenic claudication 10/26/2012   Spinal stenosis, thoracic 10/26/2012   Umbilical hernia 02/25/2012   OSA (obstructive sleep apnea) 11/12/2011   Exogenous obesity 11/12/2011   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   Hypothyroidism 09/18/2009   Diabetes mellitus without complication (HCC) 09/18/2009   Dyslipidemia 09/18/2009   Anemia 09/18/2009   Depression 09/18/2009   Essential hypertension 09/18/2009   GERD 09/18/2009   Primary localized osteoarthritis of left knee 09/18/2009    PCP: Philip Aspen, Minerva Ends   REFERRING PROVIDER: Rodolph Bong, MD     REFERRING DIAG: [phone] (ICD-10-CM) - Pain of left heel   Rationale for Evaluation and Treatment Rehabilitation   THERAPY DIAG:  Bilateral hip pain   Other low back pain   Acute left ankle pain   Muscle weakness (generalized)   Difficulty in walking, not elsewhere classified   ONSET DATE: years   SUBJECTIVE:                                                                                                                                                                                            SUBJECTIVE STATEMENT: 10/14/2021 States she hasn't been sleeping well and has been really stressed and has been having a lot of home life stresses. States she thinks she overdid her ankle pumps the other day as she had pain on the top of her feet  Eval: States she has had chronic back pain. States her last fusion was in 2017 and she was still having pain above and below her previous fusions. States MD would not recommend additional surgery at this time secondary to high risk of infection. States that her pain is increasing and that it is going up her back and into her neck. States she is worried she is getting osteoporosis. States she takes medication every 6 hours for her pain, and continues to have pain before the 6 hours comes.    States she is miserable as she is in constant pain. States then her foot injury happened when she twisted her foot to get her cat off her leg. State she has been having swelling in her left ankle   States that  she feels her balance is getting worse. States she can't cook and stand in the kitchen. States she can't visit with family anymore like she would like to.  States she has been using the walker since her first back surgery.      PERTINENT HISTORY:  left heel pain and Chronic back and hip pain, x2 lumbar fusions L1-5 SP (last fusion 02/2015), cervical surgery, R carpal tunnel,  osteopenia, neck pain   PAIN:  Are you having pain? no: NPRS scale: 0/10 Pain location: left foot, B low back/hip R>L, piriformis muscles Pain description: dull achy Aggravating factors: unsure Relieving factors: unsure      PRECAUTIONS: Fall   WEIGHT BEARING RESTRICTIONS No   FALLS:  Has patient fallen in last 6 months? Yes. Number of falls 4 last one a couple weeks ago   LIVING ENVIRONMENT: Lives with: lives alone Lives in: House/apartment Stairs: No Has following equipment at home: Quad cane small base and , grab bars in bath room, Rolator,    OCCUPATION: not working   PLOF: Independent with household mobility with device, Independent with community mobility with device, and Needs assistance with homemaking   PATIENT GOALS to improve her balance if possible      OBJECTIVE:    DIAGNOSTIC FINDINGS:  09/14/21 US  Impression: Achilles tendinitis and peritendinitis.  Retrocalcaneal bursitis       SCREENING FOR RED FLAGS: Bowel or bladder incontinence: No Spinal tumors: No Cauda equina syndrome: No Compression fracture: No Abdominal aneurysm: No   COGNITION:           Overall cognitive status: Within functional limits for tasks assessed                          SENSATION: Not tested       POSTURE: rounded shoulders, forward head, and flexed trunk , swelling noted throughout left ankle and calf   Balance: Narrow BOS no UE support 27 seconds, unable to stand on either leg   LUMBAR ROM: - not formally tested on this date   Active  A/PROM  eval  Flexion    Extension    Right lateral flexion    Left lateral flexion    Right rotation    Left rotation     (Blank rows = not tested)                  LE Measurements       Lower Extremity Right 09/28/2021 Left 09/28/2021    A/PROM MMT A/PROM MMT  Hip Flexion   4   4  Hip Extension          Hip Abduction          Hip Adduction          Hip Internal rotation          Hip External rotation          Knee  Flexion   4-   3+*  Knee Extension   4-   3+*  Ankle Dorsiflexion          Ankle Plantarflexion          Ankle Inversion          Ankle Eversion           (Blank rows = not tested)            * pain     LUMBAR SPECIAL TESTS:  Neg slump test B   FUNCTIONAL TESTS:  STS - uses hands to stand up and sit down   GAIT: Distance walked: 50 feet Assistive device utilized:  rollator Level  of assistance: SBA Comments: wide base of support, hips ER, slower gait, unsteady, trendelenburg B, antalgic left ankle        TODAY'S TREATMENT  10/14/2021 Therapeutic Exercise:    Aerobic: bike + not full rotations with PT assist at first level 1-3  Supine:  Prone:    Seated:  STS with Rolator - 5 minutes - (verbal cues and tactile cues)    Standing: marching near stairs with UE support 4x5 B, hip abd 4x5 B with UE support, heel raises with B UE support - unable to perform, forward weight shifts for calf stretch x15 5" holds B, ankle pumps - UE 2x10 B, hip ext 45x5 B B UE support Neuromuscular Re-education: staggered balance on 6" steps and 2 hand support x6 20" holds B Manual Therapy:  Therapeutic Activity:Self Care: Trigger Point Dry Needling:  Modalities:      PATIENT EDUCATION:  Education details: on importance on calling MD for referral to behavorial health Person educated: Patient Education method: Explanation, Demonstration, and Handouts Education comprehension: verbalized understanding     HOME EXERCISE PROGRAM: [id number]   ASSESSMENT:   CLINICAL IMPRESSION: 10/14/2021 Session took place in gym to help with focus on exercises. Focused on standing exercises for LE strengthening and balance. Patient did best with redirections (asking questions, singing, having patient tell stories). Fatigue in legs noted end of session. No increase in pain in heel at end of session.   Eval: Patient presents with chronic pain in back, hips and legs as well as balance difficulties. Session focused on  education and current presentation as well as plan moving forward. Patient would greatly benefit from skilled PT to improve overall function, QOL and reduce risk of falling.     OBJECTIVE IMPAIRMENTS Abnormal gait, decreased activity tolerance, decreased balance, decreased mobility, difficulty walking, decreased ROM, decreased strength, postural dysfunction, and pain.    ACTIVITY LIMITATIONS bending, standing, stairs, transfers, bed mobility, bathing, and locomotion level   PARTICIPATION LIMITATIONS: meal prep, cleaning, and community activity   PERSONAL FACTORS Age, Fitness, and 3+ comorbidities: x2 lumbar fusions, chronic pain, lives a lone, cervical surgery  are also affecting patient's functional outcome.    REHAB POTENTIAL: Good   CLINICAL DECISION MAKING: Evolving/moderate complexity   EVALUATION COMPLEXITY: Moderate       GOALS: Goals reviewed with patient?  yes   SHORT TERM GOALS:   Patient will be independent in self management strategies to improve quality of life and functional outcomes. Baseline: new program Target date: 10/26/2021 Goal status: INITIAL   2.  Patient will report at least 25% improvement in overall symptoms and/or function to demonstrate improved functional mobility Baseline: 0% Target date: 10/26/2021 Goal status: INITIAL   3.  Patient will be able to balance for at least 45 seconds in narrow BOS without UE support to demonstrate improved static balance. Baseline: see above Target date: 10/26/2021 Goal status: INITIAL           LONG TERM GOALS:   Patient will report at least 50% improvement in overall symptoms and/or function to demonstrate improved functional mobility Baseline: 0% Target date: 11/23/2021 Goal status: INITIAL   2.  Patient will be able to stand for at least 15 minutes to improve functional endurance with standing to prepare a meal Baseline: unable Target date: 11/23/2021 Goal status: INITIAL   3.  Patient will be able to  balance in semi tandem for at least 15 seconds with either leg forward to demonstrate improved static balance Baseline: unable  Target date: 11/23/2021 Goal status: INITIAL       PLAN: PT FREQUENCY: 2x/week   PT DURATION: 8 weeks   PLANNED INTERVENTIONS: Therapeutic exercises, Therapeutic activity, Neuromuscular re-education, Balance training, Gait training, Patient/Family education, Self Care, Joint mobilization, Joint manipulation, Stair training, Orthotic/Fit training, Aquatic Therapy, Dry Needling, Electrical stimulation, Cryotherapy, Moist heat, Vasopneumatic device, Traction, Ionotophoresis 4mg /ml Dexamethasone, and Manual therapy.   PLAN FOR NEXT SESSION: DOES NOT TOLERATE SUPINE OR LAYING DOWN POSITIONS, Needs assist with bike, FU with making behavorial health referral - pt needs to call MD- redirect for any functional exercises or balance exercises.      3:13 PM, 10/14/21 10/16/21, DPT Physical Therapy with [facility]

## 2021-10-17 ENCOUNTER — Other Ambulatory Visit: Payer: Self-pay | Admitting: Internal Medicine

## 2021-10-19 ENCOUNTER — Ambulatory Visit (HOSPITAL_COMMUNITY)
Admission: RE | Admit: 2021-10-19 | Discharge: 2021-10-19 | Disposition: A | Discharge status: Home / Self Care | Payer: Medicare Other | Source: Ambulatory Visit | Attending: Cardiology | Admitting: Cardiology

## 2021-10-19 ENCOUNTER — Ambulatory Visit: Payer: Medicare Other | Admitting: Family Medicine

## 2021-10-19 ENCOUNTER — Ambulatory Visit (INDEPENDENT_AMBULATORY_CARE_PROVIDER_SITE_OTHER): Payer: Medicare Other

## 2021-10-19 ENCOUNTER — Ambulatory Visit: Payer: Self-pay

## 2021-10-19 VITALS — BP 138/78 | HR 77 | Ht 60.0 in | Wt 195.2 lb

## 2021-10-19 DIAGNOSIS — M7989 Other specified soft tissue disorders: Secondary | ICD-10-CM | POA: Diagnosis not present

## 2021-10-19 DIAGNOSIS — M7732 Calcaneal spur, left foot: Secondary | ICD-10-CM | POA: Diagnosis not present

## 2021-10-19 DIAGNOSIS — M79672 Pain in left foot: Secondary | ICD-10-CM

## 2021-10-19 NOTE — Patient Instructions (Addendum)
Thank you for coming in today.   Please go to [facility] supply to get the CAM Henrietta Dine we talked about today. You may also be able to get it from [employer].    You have a vascular ultrasound scheduled for 3:30 today. [facility] [address], [place], [place] [address] [phone]  Please get an Xray today before you leave   You should hear from MRI scheduling within 1 week. If you do not hear please let me know.    Check back after MRI

## 2021-10-19 NOTE — Progress Notes (Signed)
I, Philbert Riser, LAT, ATC acting as a scribe for Katie Graham, MD.  Issabella Booker is a 76 y.o. female who presents to [facility] today for f/u L heel, low back, and bilat hip pain. Pt was last seen by Dr. Denyse Amass on 09/14/21 and was taught eccentric HEP and was referred to PT, completing 5 visits. Today, pt reports L foot pain has worsened w/ swelling into her L lower leg. Pt also c/o the same to worse low back and bilat hip pain and continued balance problems.   She notes that she has a drain cover in her shower that is loose and she stepped in a hole with her heel forcefully dorsiflexing her foot causing worsening pain and swelling.  Aside from that incident she cannot recall a specific injury.  Dx imaging: 04/09/16 L-spine MRI  03/18/15 L-spine XR  02/05/15 L-spine MRI  10/13/13 L-spine MRI  09/25/13 R hip XR  Pertinent review of systems: No fevers or chills  Relevant historical information: Diabetes.  No history of DVT. Significant MRI claustrophobia  Exam:  BP 138/78   Pulse 77   Ht 5' (1.524 m)   Wt 195 lb 3.2 oz (88.5 kg)   SpO2 98%   BMI 38.12 kg/m  General: Well Developed, well nourished, and in no acute distress.   MSK: Left lower leg swelling with increased calf diameter left compared to right.  Left ankle tender palpation posterior calcaneus. Pulses capillary fill and sensation are intact distally.    Lab and Radiology Results  Diagnostic Limited MSK Ultrasound of: Left Achilles tendon Achilles tendon significant tendon disruption approximately 2 cm proximal to calcaneus consistent with either complete rupture or partial tear.  The distal end of the tendon stump there is an area of calcific change seen as hyperechoic change on ultrasound. Impression: Concerning for Achilles tendon rupture  X-ray images left ankle obtained today personally and independently interpreted No acute fractures.  Mild ankle DJD.  Calcific change at distal  Achilles tendon present. Await formal radiology review   Assessment and Plan: 76 y.o. female with worsening left ankle pain.  This is concerning for Achilles tendon rupture.  Plan for cam walker boot and MRI.  Obtain x-ray today.  Additionally given her calf swelling we will proceed with vascular ultrasound to evaluate for DVT.  This is scheduled for later today.   PDMP not reviewed this encounter. Orders Placed This Encounter  Procedures   [place] LIMITED JOINT SPACE STRUCTURES LOW LEFT(NO LINKED CHARGES)    Order Specific Question:   Reason for Exam (SYMPTOM  OR DIAGNOSIS REQUIRED)    Answer:   left foot pain    Order Specific Question:   Preferred imaging location?    Answer:   [facility] Ankle Complete Left    Standing Status:   Future    Number of Occurrences:   1    Standing Expiration Date:   10/20/2022    Order Specific Question:   Reason for Exam (SYMPTOM  OR DIAGNOSIS REQUIRED)    Answer:   left ankle pain    Order Specific Question:   Preferred imaging location?    Answer:   [facility]   MR ANKLE LEFT WO CONTRAST    Further evaluate the cause of her L foot/ankle pain    Standing Status:   Future    Standing Expiration Date:   10/20/2022    Order Specific Question:  What is the patient's sedation requirement?    Answer:   No Sedation    Order Specific Question:   Does the patient have a pacemaker or implanted devices?    Answer:   No    Order Specific Question:   Preferred imaging location?    Answer:   [facility] (table limit-550lbs)   No orders of the defined types were placed in this encounter.    Discussed warning signs or symptoms. Please see discharge instructions. Patient expresses understanding.   The above documentation has been reviewed and is accurate and complete Katie Booker, M.D.

## 2021-10-20 ENCOUNTER — Encounter: Payer: Self-pay | Admitting: Physical Therapy

## 2021-10-20 ENCOUNTER — Ambulatory Visit: Payer: Medicare Other | Admitting: Physical Therapy

## 2021-10-20 DIAGNOSIS — M25551 Pain in right hip: Secondary | ICD-10-CM

## 2021-10-20 DIAGNOSIS — R262 Difficulty in walking, not elsewhere classified: Secondary | ICD-10-CM | POA: Diagnosis not present

## 2021-10-20 DIAGNOSIS — M25552 Pain in left hip: Secondary | ICD-10-CM | POA: Diagnosis not present

## 2021-10-20 DIAGNOSIS — M5459 Other low back pain: Secondary | ICD-10-CM | POA: Diagnosis not present

## 2021-10-20 DIAGNOSIS — M25572 Pain in left ankle and joints of left foot: Secondary | ICD-10-CM

## 2021-10-20 DIAGNOSIS — M6281 Muscle weakness (generalized): Secondary | ICD-10-CM

## 2021-10-20 NOTE — Progress Notes (Signed)
No DVT is seen.

## 2021-10-20 NOTE — Therapy (Signed)
OUTPATIENT PHYSICAL THERAPY TREATMENT NOTE   Patient Name: Katie Booker MRN: [id number] DOB:1945/04/10, 76 y.o., female Today's Date: 10/20/2021    END OF SESSION:   PT End of Session - 10/20/21 1401     Visit Number 6    Number of Visits 16    Date for PT Re-Evaluation 11/23/21    Authorization Type [employer]    PT Start Time 1220    PT Stop Time 1300    PT Time Calculation (min) 40 min    Equipment Utilized During Treatment Gait belt    Activity Tolerance Patient tolerated treatment well    Behavior During Therapy WFL for tasks assessed/performed              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   Depression    DOE (dyspnea on exertion)    GERD    Headache    Hemorrhoids    History of basal cell carcinoma excision    left cheek   History of colon polyps    History of hyperthyroidism 2011   RAI treatement   History of panic attacks    Hyperlipidemia    Hypertension    Hypothyroidism, postradioiodine therapy    followed by pcp   IDA (iron deficiency anemia)    intermittant   Insomnia    OA (osteoarthritis)    OSA (obstructive sleep apnea)    no cpap, per pt tried unable to tolerate   Peripheral neuropathy    legs and feet from hx back surgery's   Rash    bilateral axilla area -- per pt due to some personal wipes used other the counter   RLS (restless legs syndrome)    Spondylolisthesis at L1-L2 level    Tingling of both upper extremities    left greater than right due to cervical pinched nerve   Type 2 diabetes mellitus (HCC)    followed by pcp   Umbilical hernia    Urinary frequency    Urinary urgency    Past Surgical History:  Procedure Laterality Date   ANTERIOR CERVICAL  DECOMP/DISCECTOMY FUSION N/A 09/02/2014   Procedure: Cervical five- six  Anterior cervical decompression fusion;  Surgeon: Barnett Abu, MD;  Location: [facility];  Service: Neurosurgery;  Laterality: N/A;  C5-6 Anterior cervical decompression/diskectomy/fusion   BREAST BIOPSY Right 10/2018   x 2 benign   CARPAL TUNNEL RELEASE Right 2016   CATARACT EXTRACTION W/ INTRAOCULAR LENS  IMPLANT, BILATERAL  03/2015   COLONOSCOPY  2007   ESOPHAGOGASTRODUODENOSCOPY  2007   HEMORRHOID SURGERY  03/23/2011   Procedure: HEMORRHOIDECTOMY;  Surgeon: Clovis Pu. Cornett, MD;  Location: [facility];  Service: General;  Laterality: N/A;  lateral internal sphincterotomy and hemorrhoidectomy   LUMBAR FUSION  02/2015   L1 -- 2   PARTIAL KNEE ARTHROPLASTY Left 12/27/2017   Procedure: UNICOMPARTMENTAL LEFT KNEE;  Surgeon: Teryl Lucy, MD;  Location: [facility];  Service: Orthopedics;  Laterality: Left;   THORACIC DISCECTOMY N/A 12/15/2012   Procedure: Thoracic ten-eleven Thoracic laminectomy;  Surgeon: Barnett Abu, MD;  Location: [facility];  Service: Neurosurgery;  Laterality: N/A;  Thoracic ten-eleven Thoracic  laminectomy   TUBAL LIGATION Bilateral 1991   Patient Active Problem List   Diagnosis Date Noted   Morbid obesity (HCC) 01/13/2018   S/P knee replacement 12/27/2017   Spondylolisthesis at L1-L2 level 03/18/2015   Spondylosis with myelopathy 09/02/2014   Cervical spondylosis with myelopathy 09/02/2014   Anxiety 12/21/2012   Chronic back pain 12/21/2012   Insomnia 12/21/2012   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, L2-5 12/15/2012   Spinal stenosis, lumbar region, with neurogenic claudication 10/26/2012   Spinal stenosis, thoracic 10/26/2012   Umbilical hernia 02/25/2012   OSA (obstructive sleep apnea) 11/12/2011   Exogenous obesity 11/12/2011   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   Hypothyroidism 09/18/2009   Diabetes mellitus without complication (HCC) 09/18/2009   Dyslipidemia  09/18/2009   Anemia 09/18/2009   Depression 09/18/2009   Essential hypertension 09/18/2009   GERD 09/18/2009   Primary localized osteoarthritis of left knee 09/18/2009   PCP: Philip Aspen, Minerva Ends   REFERRING PROVIDER: Rodolph Bong, MD     REFERRING DIAG: [phone] (ICD-10-CM) - Pain of left heel   Rationale for Evaluation and Treatment Rehabilitation   THERAPY DIAG:  Bilateral hip pain   Other low back pain   Acute left ankle pain   Muscle weakness (generalized)   Difficulty in walking, not elsewhere classified   ONSET DATE: years   SUBJECTIVE:                                                                                                                                                                                            SUBJECTIVE STATEMENT: 10/20/2021 Pt had MD appt yesterday. New findings on [place] for achilles. She will have and Mri, and also will pick up CAM walker boot today. States incident a week or so ago, did have increased pain , but now Not having significant pain from previous weeks. States still taking pain meds    Eval: States she has had chronic back pain. States her last fusion was in 2017 and she was still having pain above and below her previous fusions. States MD would not recommend additional surgery at this time secondary to high risk of infection. States that her pain is increasing and that it is going up her back and into her neck. States she is worried she is getting osteoporosis. States she takes medication every 6 hours for her pain, and continues to have pain before the 6 hours comes.    States she is miserable as she is in constant pain. States then her foot injury happened when she twisted her foot to get her cat off her leg. State she has been having swelling  in her left ankle   States that  she feels her balance is getting worse. States she can't cook and stand in the kitchen. States she can't visit with family anymore like she would like  to.    States she has been using the walker since her first back surgery.      PERTINENT HISTORY:  left heel pain and Chronic back and hip pain, x2 lumbar fusions L1-5 SP (last fusion 02/2015), cervical surgery, R carpal tunnel, osteopenia, neck pain   PAIN:  Are you having pain? no: NPRS scale: 0/10 Pain location: left foot, B low back/hip R>L, piriformis muscles Pain description: dull achy Aggravating factors: unsure Relieving factors: unsure      PRECAUTIONS: Fall   WEIGHT BEARING RESTRICTIONS No   FALLS:  Has patient fallen in last 6 months? Yes. Number of falls 4 last one a couple weeks ago   LIVING ENVIRONMENT: Lives with: lives alone Lives in: House/apartment Stairs: No Has following equipment at home: Quad cane small base and , grab bars in bath room, Rolator,    OCCUPATION: not working   PLOF: Independent with household mobility with device, Independent with community mobility with device, and Needs assistance with homemaking   PATIENT GOALS to improve her balance if possible      OBJECTIVE:    DIAGNOSTIC FINDINGS:  09/14/21 US  Impression: Achilles tendinitis and peritendinitis.  Retrocalcaneal bursitis       SCREENING FOR RED FLAGS: Bowel or bladder incontinence: No Spinal tumors: No Cauda equina syndrome: No Compression fracture: No Abdominal aneurysm: No   COGNITION:           Overall cognitive status: Within functional limits for tasks assessed                          SENSATION: Not tested       POSTURE: rounded shoulders, forward head, and flexed trunk , swelling noted throughout left ankle and calf   Balance: Narrow BOS no UE support 27 seconds, unable to stand on either leg   LUMBAR ROM: - not formally tested on this date   Active  A/PROM  eval  Flexion    Extension    Right lateral flexion    Left lateral flexion    Right rotation    Left rotation     (Blank rows = not tested)                  LE Measurements       Lower  Extremity Right 09/28/2021 Left 09/28/2021    A/PROM MMT A/PROM MMT  Hip Flexion   4   4  Hip Extension          Hip Abduction          Hip Adduction          Hip Internal rotation          Hip External rotation          Knee Flexion   4-   3+*  Knee Extension   4-   3+*  Ankle Dorsiflexion          Ankle Plantarflexion          Ankle Inversion          Ankle Eversion           (Blank rows = not tested)            * pain  LUMBAR SPECIAL TESTS:  Neg slump test B   FUNCTIONAL TESTS:  STS - uses hands to stand up and sit down   GAIT: Distance walked: 50 feet Assistive device utilized:  rollator Level of assistance: SBA Comments: wide base of support, hips ER, slower gait, unsteady, trendelenburg B, antalgic left ankle        TODAY'S TREATMENT  10/20/2021 Therapeutic Exercise:    Aerobic:  Supine:  Prone:    Seated:  LAQ 2 x 10 bil;  STS x 8, no UE support (from chair) ; Seated HR x 15;     Standing: Marching x 20 at walker.  Neuromuscular Re-education: , light UE support;  standing balance: static standing 1 min x 2,   head turns,  L/R 2x10 and up/down x 10, tandem stance  30 sec x 2 bil;  Walking 45 ft x 4 with RW;  Manual Therapy:      PATIENT EDUCATION:  Education details: discussed use of brace, balance, and need to increase activity at home.  Person educated: Patient Education method: Explanation, Demonstration, and Handouts Education comprehension: verbalized understanding     HOME EXERCISE PROGRAM: [id number]   ASSESSMENT:   CLINICAL IMPRESSION: 10/20/2021 Pt challenged with standing balance today. Increased sway with head turns. Discussed need to stand at walker in front of chair at home, and also to walk in home more often for improving endurance for standing activity. Pts LEs fatigue easily with static standing and balance today. Reports hip pain with walking- will progress hip strength next visit. Discussed implications for balance when using CAM boot  for first time, she will bring it to next appt.    Eval: Patient presents with chronic pain in back, hips and legs as well as balance difficulties. Session focused on education and current presentation as well as plan moving forward. Patient would greatly benefit from skilled PT to improve overall function, QOL and reduce risk of falling.     OBJECTIVE IMPAIRMENTS Abnormal gait, decreased activity tolerance, decreased balance, decreased mobility, difficulty walking, decreased ROM, decreased strength, postural dysfunction, and pain.    ACTIVITY LIMITATIONS bending, standing, stairs, transfers, bed mobility, bathing, and locomotion level   PARTICIPATION LIMITATIONS: meal prep, cleaning, and community activity   PERSONAL FACTORS Age, Fitness, and 3+ comorbidities: x2 lumbar fusions, chronic pain, lives a lone, cervical surgery  are also affecting patient's functional outcome.    REHAB POTENTIAL: Good   CLINICAL DECISION MAKING: Evolving/moderate complexity   EVALUATION COMPLEXITY: Moderate       GOALS: Goals reviewed with patient?  yes   SHORT TERM GOALS:   Patient will be independent in self management strategies to improve quality of life and functional outcomes. Baseline: new program Target date: 10/26/2021 Goal status: INITIAL   2.  Patient will report at least 25% improvement in overall symptoms and/or function to demonstrate improved functional mobility Baseline: 0% Target date: 10/26/2021 Goal status: INITIAL   3.  Patient will be able to balance for at least 45 seconds in narrow BOS without UE support to demonstrate improved static balance. Baseline: see above Target date: 10/26/2021 Goal status: INITIAL           LONG TERM GOALS:   Patient will report at least 50% improvement in overall symptoms and/or function to demonstrate improved functional mobility Baseline: 0% Target date: 11/23/2021 Goal status: INITIAL   2.  Patient will be able to stand for at least 15  minutes to improve functional endurance with standing to prepare  a meal Baseline: unable Target date: 11/23/2021 Goal status: INITIAL   3.  Patient will be able to balance in semi tandem for at least 15 seconds with either leg forward to demonstrate improved static balance Baseline: unable Target date: 11/23/2021 Goal status: INITIAL       PLAN: PT FREQUENCY: 2x/week   PT DURATION: 8 weeks   PLANNED INTERVENTIONS: Therapeutic exercises, Therapeutic activity, Neuromuscular re-education, Balance training, Gait training, Patient/Family education, Self Care, Joint mobilization, Joint manipulation, Stair training, Orthotic/Fit training, Aquatic Therapy, Dry Needling, Electrical stimulation, Cryotherapy, Moist heat, Vasopneumatic device, Traction, Ionotophoresis 4mg /ml Dexamethasone, and Manual therapy.   PLAN FOR NEXT SESSION: DOES NOT TOLERATE SUPINE OR LAYING DOWN POSITIONS, Needs assist with bike, FU with making behavorial health referral - pt needs to call MD- redirect for any functional exercises or balance exercises.     , PT, DPT 2:07 PM  10/20/21

## 2021-10-20 NOTE — Progress Notes (Signed)
Left ankle x-ray shows no fractures.  MRI should be helpful to look at the Achilles tendon.

## 2021-10-22 ENCOUNTER — Encounter: Payer: Self-pay | Admitting: Physical Therapy

## 2021-10-22 ENCOUNTER — Ambulatory Visit: Payer: Medicare Other | Admitting: Physical Therapy

## 2021-10-22 DIAGNOSIS — M25552 Pain in left hip: Secondary | ICD-10-CM | POA: Diagnosis not present

## 2021-10-22 DIAGNOSIS — M5459 Other low back pain: Secondary | ICD-10-CM | POA: Diagnosis not present

## 2021-10-22 DIAGNOSIS — M25551 Pain in right hip: Secondary | ICD-10-CM

## 2021-10-22 DIAGNOSIS — M6281 Muscle weakness (generalized): Secondary | ICD-10-CM | POA: Diagnosis not present

## 2021-10-22 DIAGNOSIS — M25572 Pain in left ankle and joints of left foot: Secondary | ICD-10-CM

## 2021-10-22 DIAGNOSIS — R262 Difficulty in walking, not elsewhere classified: Secondary | ICD-10-CM

## 2021-10-22 NOTE — Therapy (Deleted)
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   Depression    DOE (dyspnea on exertion)    GERD    Headache    Hemorrhoids    History of basal cell carcinoma excision    left cheek   History of colon polyps    History of hyperthyroidism 2011   RAI treatement   History of panic attacks    Hyperlipidemia    Hypertension    Hypothyroidism, postradioiodine therapy    followed by pcp   IDA (iron deficiency anemia)    intermittant   Insomnia    OA (osteoarthritis)    OSA (obstructive sleep apnea)    no cpap, per pt tried unable to tolerate   Peripheral neuropathy    legs and feet from hx back surgery's   Rash    bilateral axilla area -- per pt due to some personal wipes used other the counter   RLS (restless legs syndrome)    Spondylolisthesis at L1-L2 level    Tingling of both upper extremities    left greater than right due to cervical pinched nerve   Type 2 diabetes mellitus (HCC)    followed by pcp   Umbilical hernia    Urinary frequency    Urinary urgency    Past Surgical History:  Procedure Laterality Date   ANTERIOR CERVICAL DECOMP/DISCECTOMY FUSION N/A 09/02/2014   Procedure: Cervical five- six  Anterior cervical decompression fusion;  Surgeon: Barnett Abu, MD;  Location: [facility];  Service: Neurosurgery;  Laterality: N/A;  C5-6 Anterior cervical decompression/diskectomy/fusion   BREAST BIOPSY Right 10/2018   x 2 benign   CARPAL TUNNEL RELEASE Right 2016   CATARACT EXTRACTION W/ INTRAOCULAR LENS  IMPLANT, BILATERAL  03/2015    COLONOSCOPY  2007   ESOPHAGOGASTRODUODENOSCOPY  2007   HEMORRHOID SURGERY  03/23/2011   Procedure: HEMORRHOIDECTOMY;  Surgeon: Clovis Pu. Cornett, MD;  Location: [facility];  Service: General;  Laterality: N/A;  lateral internal sphincterotomy and hemorrhoidectomy   LUMBAR FUSION  02/2015   L1 -- 2   PARTIAL KNEE ARTHROPLASTY Left 12/27/2017   Procedure: UNICOMPARTMENTAL LEFT KNEE;  Surgeon: Teryl Lucy, MD;  Location: [facility];  Service: Orthopedics;  Laterality: Left;   THORACIC DISCECTOMY N/A 12/15/2012   Procedure: Thoracic ten-eleven Thoracic laminectomy;  Surgeon: Barnett Abu, MD;  Location: [facility];  Service: Neurosurgery;  Laterality: N/A;  Thoracic ten-eleven Thoracic laminectomy   TUBAL LIGATION Bilateral 1991   Patient Active Problem List   Diagnosis Date Noted   Morbid obesity (HCC) 01/13/2018   S/P knee replacement 12/27/2017   Spondylolisthesis at L1-L2 level 03/18/2015   Spondylosis with myelopathy 09/02/2014   Cervical spondylosis with myelopathy 09/02/2014   Anxiety 12/21/2012   Chronic back pain 12/21/2012   Insomnia 12/21/2012   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, L2-5 12/15/2012   Spinal stenosis, lumbar region, with neurogenic claudication 10/26/2012   Spinal stenosis, thoracic 10/26/2012   Umbilical hernia 02/25/2012  OSA (obstructive sleep apnea) 11/12/2011   Exogenous obesity 11/12/2011   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   Hypothyroidism 09/18/2009   Diabetes mellitus without complication (HCC) 09/18/2009   Dyslipidemia 09/18/2009   Anemia 09/18/2009   Depression 09/18/2009   Essential hypertension 09/18/2009   GERD 09/18/2009   Primary localized osteoarthritis of left knee 09/18/2009   PCP: Philip Aspen, Minerva Ends   REFERRING PROVIDER: Rodolph Bong, MD     REFERRING DIAG: [phone] (ICD-10-CM) - Pain of left heel   Rationale for Evaluation and Treatment Rehabilitation   THERAPY DIAG:  Bilateral hip pain   Other  low back pain   Acute left ankle pain   Muscle weakness (generalized)   Difficulty in walking, not elsewhere classified   ONSET DATE: years   SUBJECTIVE:                                                                                                                                                                                            SUBJECTIVE STATEMENT: 10/22/2021 Pt had MD appt yesterday. New findings on [place] for achilles. She will have and Mri, and also will pick up CAM walker boot today. States incident a week or so ago, did have increased pain , but now Not having significant pain from previous weeks. States still taking pain meds    Eval: States she has had chronic back pain. States her last fusion was in 2017 and she was still having pain above and below her previous fusions. States MD would not recommend additional surgery at this time secondary to high risk of infection. States that her pain is increasing and that it is going up her back and into her neck. States she is worried she is getting osteoporosis. States she takes medication every 6 hours for her pain, and continues to have pain before the 6 hours comes.    States she is miserable as she is in constant pain. States then her foot injury happened when she twisted her foot to get her cat off her leg. State she has been having swelling in her left ankle   States that  she feels her balance is getting worse. States she can't cook and stand in the kitchen. States she can't visit with family anymore like she would like to.    States she has been using the walker since her first back surgery.      PERTINENT HISTORY:  left heel pain and Chronic back and hip pain, x2 lumbar fusions L1-5 SP (last fusion 02/2015), cervical surgery, R carpal tunnel, osteopenia, neck pain   PAIN:  Are you having pain? no: NPRS scale:  0/10 Pain location: left foot, B low back/hip R>L, piriformis muscles Pain description: dull achy Aggravating  factors: unsure Relieving factors: unsure      PRECAUTIONS: Fall   WEIGHT BEARING RESTRICTIONS No   FALLS:  Has patient fallen in last 6 months? Yes. Number of falls 4 last one a couple weeks ago   LIVING ENVIRONMENT: Lives with: lives alone Lives in: House/apartment Stairs: No Has following equipment at home: Quad cane small base and , grab bars in bath room, Rolator,    OCCUPATION: not working   PLOF: Independent with household mobility with device, Independent with community mobility with device, and Needs assistance with homemaking   PATIENT GOALS to improve her balance if possible      OBJECTIVE:    DIAGNOSTIC FINDINGS:  09/14/21 US  Impression: Achilles tendinitis and peritendinitis.  Retrocalcaneal bursitis       SCREENING FOR RED FLAGS: Bowel or bladder incontinence: No Spinal tumors: No Cauda equina syndrome: No Compression fracture: No Abdominal aneurysm: No   COGNITION:           Overall cognitive status: Within functional limits for tasks assessed                          SENSATION: Not tested       POSTURE: rounded shoulders, forward head, and flexed trunk , swelling noted throughout left ankle and calf   Balance: Narrow BOS no UE support 27 seconds, unable to stand on either leg   LUMBAR ROM: - not formally tested on this date   Active  A/PROM  eval  Flexion    Extension    Right lateral flexion    Left lateral flexion    Right rotation    Left rotation     (Blank rows = not tested)                  LE Measurements       Lower Extremity Right 09/28/2021 Left 09/28/2021    A/PROM MMT A/PROM MMT  Hip Flexion   4   4  Hip Extension          Hip Abduction          Hip Adduction          Hip Internal rotation          Hip External rotation          Knee Flexion   4-   3+*  Knee Extension   4-   3+*  Ankle Dorsiflexion          Ankle Plantarflexion          Ankle Inversion          Ankle Eversion           (Blank rows = not  tested)            * pain     LUMBAR SPECIAL TESTS:  Neg slump test B   FUNCTIONAL TESTS:  STS - uses hands to stand up and sit down   GAIT: Distance walked: 50 feet Assistive device utilized:  rollator Level of assistance: SBA Comments: wide base of support, hips ER, slower gait, unsteady, trendelenburg B, antalgic left ankle        TODAY'S TREATMENT  10/22/2021 Therapeutic Exercise:    Aerobic:  Supine:  Prone:    Seated:  LAQ 2 x 10 bil;  STS x 8, no UE support (  from chair) ; Seated HR x 15;     Standing: Marching x 20 at walker.  Neuromuscular Re-education: , light UE support;  standing balance: static standing 1 min x 2,   head turns,  L/R 2x10 and up/down x 10, tandem stance  30 sec x 2 bil;  Walking 45 ft x 4 with RW;  Manual Therapy:      PATIENT EDUCATION:  Education details: discussed use of brace, balance, and need to increase activity at home.  Person educated: Patient Education method: Explanation, Demonstration, and Handouts Education comprehension: verbalized understanding     HOME EXERCISE PROGRAM: [id number]   ASSESSMENT:   CLINICAL IMPRESSION: 10/22/2021 Pt challenged with standing balance today. Increased sway with head turns. Discussed need to stand at walker in front of chair at home, and also to walk in home more often for improving endurance for standing activity. Pts LEs fatigue easily with static standing and balance today. Reports hip pain with walking- will progress hip strength next visit. Discussed implications for balance when using CAM boot for first time, she will bring it to next appt.    Eval: Patient presents with chronic pain in back, hips and legs as well as balance difficulties. Session focused on education and current presentation as well as plan moving forward. Patient would greatly benefit from skilled PT to improve overall function, QOL and reduce risk of falling.     OBJECTIVE IMPAIRMENTS Abnormal gait, decreased activity  tolerance, decreased balance, decreased mobility, difficulty walking, decreased ROM, decreased strength, postural dysfunction, and pain.    ACTIVITY LIMITATIONS bending, standing, stairs, transfers, bed mobility, bathing, and locomotion level   PARTICIPATION LIMITATIONS: meal prep, cleaning, and community activity   PERSONAL FACTORS Age, Fitness, and 3+ comorbidities: x2 lumbar fusions, chronic pain, lives a lone, cervical surgery  are also affecting patient's functional outcome.    REHAB POTENTIAL: Good   CLINICAL DECISION MAKING: Evolving/moderate complexity   EVALUATION COMPLEXITY: Moderate       GOALS: Goals reviewed with patient?  yes   SHORT TERM GOALS:   Patient will be independent in self management strategies to improve quality of life and functional outcomes. Baseline: new program Target date: 10/26/2021 Goal status: INITIAL   2.  Patient will report at least 25% improvement in overall symptoms and/or function to demonstrate improved functional mobility Baseline: 0% Target date: 10/26/2021 Goal status: INITIAL   3.  Patient will be able to balance for at least 45 seconds in narrow BOS without UE support to demonstrate improved static balance. Baseline: see above Target date: 10/26/2021 Goal status: INITIAL           LONG TERM GOALS:   Patient will report at least 50% improvement in overall symptoms and/or function to demonstrate improved functional mobility Baseline: 0% Target date: 11/23/2021 Goal status: INITIAL   2.  Patient will be able to stand for at least 15 minutes to improve functional endurance with standing to prepare a meal Baseline: unable Target date: 11/23/2021 Goal status: INITIAL   3.  Patient will be able to balance in semi tandem for at least 15 seconds with either leg forward to demonstrate improved static balance Baseline: unable Target date: 11/23/2021 Goal status: INITIAL       PLAN: PT FREQUENCY: 2x/week   PT DURATION: 8 weeks    PLANNED INTERVENTIONS: Therapeutic exercises, Therapeutic activity, Neuromuscular re-education, Balance training, Gait training, Patient/Family education, Self Care, Joint mobilization, Joint manipulation, Stair training, Orthotic/Fit training, Aquatic Therapy, Dry Needling, Electrical  stimulation, Cryotherapy, Moist heat, Vasopneumatic device, Traction, Ionotophoresis 4mg /ml Dexamethasone, and Manual therapy.   PLAN FOR NEXT SESSION: DOES NOT TOLERATE SUPINE OR LAYING DOWN POSITIONS, Needs assist with bike, FU with making behavorial health referral - pt needs to call MD- redirect for any functional exercises or balance exercises.    PT, DPT 10/22/21  10:47 AM

## 2021-10-22 NOTE — Therapy (Signed)
OUTPATIENT PHYSICAL THERAPY TREATMENT NOTE   Patient Name: Katie Booker MRN: [id number] DOB:22-Feb-1946, 76 y.o., female Today's Date: 10/22/2021    END OF SESSION:   PT End of Session - 10/22/21 1216     Visit Number 7    Number of Visits 16    Date for PT Re-Evaluation 11/23/21    Authorization Type [employer]    PT Start Time 1220    PT Stop Time 1300    PT Time Calculation (min) 40 min    Equipment Utilized During Treatment Gait belt    Activity Tolerance Patient tolerated treatment well    Behavior During Therapy WFL for tasks assessed/performed              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   Depression    DOE (dyspnea on exertion)    GERD    Headache    Hemorrhoids    History of basal cell carcinoma excision    left cheek   History of colon polyps    History of hyperthyroidism 2011   RAI treatement   History of panic attacks    Hyperlipidemia    Hypertension    Hypothyroidism, postradioiodine therapy    followed by pcp   IDA (iron deficiency anemia)    intermittant   Insomnia    OA (osteoarthritis)    OSA (obstructive sleep apnea)    no cpap, per pt tried unable to tolerate   Peripheral neuropathy    legs and feet from hx back surgery's   Rash    bilateral axilla area -- per pt due to some personal wipes used other the counter   RLS (restless legs syndrome)    Spondylolisthesis at L1-L2 level    Tingling of both upper extremities    left greater than right due to cervical pinched nerve   Type 2 diabetes mellitus (HCC)    followed by pcp   Umbilical hernia    Urinary frequency    Urinary urgency    Past Surgical History:  Procedure Laterality Date   ANTERIOR CERVICAL  DECOMP/DISCECTOMY FUSION N/A 09/02/2014   Procedure: Cervical five- six  Anterior cervical decompression fusion;  Surgeon: Barnett Abu, MD;  Location: [facility];  Service: Neurosurgery;  Laterality: N/A;  C5-6 Anterior cervical decompression/diskectomy/fusion   BREAST BIOPSY Right 10/2018   x 2 benign   CARPAL TUNNEL RELEASE Right 2016   CATARACT EXTRACTION W/ INTRAOCULAR LENS  IMPLANT, BILATERAL  03/2015   COLONOSCOPY  2007   ESOPHAGOGASTRODUODENOSCOPY  2007   HEMORRHOID SURGERY  03/23/2011   Procedure: HEMORRHOIDECTOMY;  Surgeon: Clovis Pu. Cornett, MD;  Location: [facility];  Service: General;  Laterality: N/A;  lateral internal sphincterotomy and hemorrhoidectomy   LUMBAR FUSION  02/2015   L1 -- 2   PARTIAL KNEE ARTHROPLASTY Left 12/27/2017   Procedure: UNICOMPARTMENTAL LEFT KNEE;  Surgeon: Teryl Lucy, MD;  Location: [facility];  Service: Orthopedics;  Laterality: Left;   THORACIC DISCECTOMY N/A 12/15/2012   Procedure: Thoracic ten-eleven Thoracic laminectomy;  Surgeon: Barnett Abu, MD;  Location: [facility];  Service: Neurosurgery;  Laterality: N/A;  Thoracic ten-eleven Thoracic  laminectomy   TUBAL LIGATION Bilateral 1991   Patient Active Problem List   Diagnosis Date Noted   Morbid obesity (HCC) 01/13/2018   S/P knee replacement 12/27/2017   Spondylolisthesis at L1-L2 level 03/18/2015   Spondylosis with myelopathy 09/02/2014   Cervical spondylosis with myelopathy 09/02/2014   Anxiety 12/21/2012   Chronic back pain 12/21/2012   Insomnia 12/21/2012   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, L2-5 12/15/2012   Spinal stenosis, lumbar region, with neurogenic claudication 10/26/2012   Spinal stenosis, thoracic 10/26/2012   Umbilical hernia 02/25/2012   OSA (obstructive sleep apnea) 11/12/2011   Exogenous obesity 11/12/2011   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   Hypothyroidism 09/18/2009   Diabetes mellitus without complication (HCC) 09/18/2009   Dyslipidemia  09/18/2009   Anemia 09/18/2009   Depression 09/18/2009   Essential hypertension 09/18/2009   GERD 09/18/2009   Primary localized osteoarthritis of left knee 09/18/2009   PCP: Philip Aspen, Minerva Ends   REFERRING PROVIDER: Rodolph Bong, MD     REFERRING DIAG: [phone] (ICD-10-CM) - Pain of left heel   Rationale for Evaluation and Treatment Rehabilitation   THERAPY DIAG:  Bilateral hip pain   Other low back pain   Acute left ankle pain   Muscle weakness (generalized)   Difficulty in walking, not elsewhere classified   ONSET DATE: years   SUBJECTIVE:                                                                                                                                                                                            SUBJECTIVE STATEMENT: 10/22/2021 Relayed information to pt that MD okayed waiting to wear CAM boot at this time, due to pt fear of falling. She went to store to get boot, but did not feel comfortable to wear it when walking. States continued swelling in leg, has not tried elevation or compression socks yet.    Eval: States she has had chronic back pain. States her last fusion was in 2017 and she was still having pain above and below her previous fusions. States MD would not recommend additional surgery at this time secondary to high risk of infection. States that her pain is increasing and that it is going up her back and into her neck. States she is worried she is getting osteoporosis. States she takes medication every 6 hours for her pain, and continues to have pain before the 6 hours comes.    States she is miserable as she is in constant pain. States then her foot injury happened when she twisted her foot to get her cat off her leg. State she has been having swelling  in her left ankle   States that  she feels her balance is getting worse. States she can't cook and stand in the kitchen. States she can't visit with family anymore like she would like  to.    States she has been using the walker since her first back surgery.      PERTINENT HISTORY:  left heel pain and Chronic back and hip pain, x2 lumbar fusions L1-5 SP (last fusion 02/2015), cervical surgery, R carpal tunnel, osteopenia, neck pain   PAIN:  Are you having pain? no: NPRS scale: 0/10 Pain location: left foot, B low back/hip R>L, piriformis muscles Pain description: dull achy Aggravating factors: unsure Relieving factors: unsure      PRECAUTIONS: Fall   WEIGHT BEARING RESTRICTIONS No   FALLS:  Has patient fallen in last 6 months? Yes. Number of falls 4 last one a couple weeks ago   LIVING ENVIRONMENT: Lives with: lives alone Lives in: House/apartment Stairs: No Has following equipment at home: Quad cane small base and , grab bars in bath room, Rolator,    OCCUPATION: not working   PLOF: Independent with household mobility with device, Independent with community mobility with device, and Needs assistance with homemaking   PATIENT GOALS to improve her balance if possible      OBJECTIVE:    DIAGNOSTIC FINDINGS:  09/14/21 US  Impression: Achilles tendinitis and peritendinitis.  Retrocalcaneal bursitis       SCREENING FOR RED FLAGS: Bowel or bladder incontinence: No Spinal tumors: No Cauda equina syndrome: No Compression fracture: No Abdominal aneurysm: No   COGNITION:           Overall cognitive status: Within functional limits for tasks assessed                          SENSATION: Not tested       POSTURE: rounded shoulders, forward head, and flexed trunk , swelling noted throughout left ankle and calf   Balance: Narrow BOS no UE support 27 seconds, unable to stand on either leg   LUMBAR ROM: - not formally tested on this date   Active  A/PROM  eval  Flexion    Extension    Right lateral flexion    Left lateral flexion    Right rotation    Left rotation     (Blank rows = not tested)                  LE Measurements       Lower  Extremity Right 09/28/2021 Left 09/28/2021    A/PROM MMT A/PROM MMT  Hip Flexion   4   4  Hip Extension          Hip Abduction          Hip Adduction          Hip Internal rotation          Hip External rotation          Knee Flexion   4-   3+*  Knee Extension   4-   3+*  Ankle Dorsiflexion          Ankle Plantarflexion          Ankle Inversion          Ankle Eversion           (Blank rows = not tested)            * pain  LUMBAR SPECIAL TESTS:  Neg slump test B   FUNCTIONAL TESTS:  STS - uses hands to stand up and sit down   GAIT: Distance walked: 50 feet Assistive device utilized:  rollator Level of assistance: SBA Comments: wide base of support, hips ER, slower gait, unsteady, trendelenburg B, antalgic left ankle       TODAY'S TREATMENT   10/22/2021 Therapeutic Exercise:    Aerobic: Recumbent bike L1 : 1.25  min, x 4 ;     Supine:     Prone:    Seated:  LAQ 2 x 10 bil , 2.5 lb;  STS x 10, no UE support (from chair) ;     Standing: Marching x 20 , Hip abd 2x 10 bil;  Neuromuscular Re-education:   light UE support : L/R weight shifts x 20;  L/R weight shift with reaching outside of BOS x 10 bil;  Manual Therapy:    Previous: Therapeutic Exercise:    Aerobic:     Supine:     Prone:    Seated:  LAQ 2 x 10 bil;  STS x 8, no UE support (from chair) ; Seated HR x 15;     Standing: Marching x 20 at walker.  Neuromuscular Re-education: , light UE support;  standing balance: static standing 1 min x 2,   head turns,  L/R 2x10 and up/down x 10, tandem stance  30 sec x 2 bil;  Walking 45 ft x 4 with RW;  Manual Therapy:      PATIENT EDUCATION:  Education details: Discussed swelling of feet, options for positioning to reduce swelling elevation and compression.  Person educated: Patient Education method: Explanation, Demonstration, and Handouts Education comprehension: verbalized understanding     HOME EXERCISE PROGRAM: [email or web address]   ASSESSMENT:   CLINICAL  IMPRESSION: 10/22/2021 Pt did well with activities today. Low tolerance for standing activity and bike due to fatigue. Discussed wearing compression socks and LE elevation for swelling. Plan to progress standing balance, stability, strength as tolerated.  Pt challenged with standing balance today. Increased sway with head turns.   Previous: Discussed need to stand at walker in front of chair at home, and also to walk in home more often for improving endurance for standing activity. Pts LEs fatigue easily with static standing and balance today. Reports hip pain with walking- will progress hip strength next visit. Discussed implications for balance when using CAM boot for first time, she will bring it to next appt.    Eval: Patient presents with chronic pain in back, hips and legs as well as balance difficulties. Session focused on education and current presentation as well as plan moving forward. Patient would greatly benefit from skilled PT to improve overall function, QOL and reduce risk of falling.     OBJECTIVE IMPAIRMENTS Abnormal gait, decreased activity tolerance, decreased balance, decreased mobility, difficulty walking, decreased ROM, decreased strength, postural dysfunction, and pain.    ACTIVITY LIMITATIONS bending, standing, stairs, transfers, bed mobility, bathing, and locomotion level   PARTICIPATION LIMITATIONS: meal prep, cleaning, and community activity   PERSONAL FACTORS Age, Fitness, and 3+ comorbidities: x2 lumbar fusions, chronic pain, lives a lone, cervical surgery  are also affecting patient's functional outcome.    REHAB POTENTIAL: Good   CLINICAL DECISION MAKING: Evolving/moderate complexity   EVALUATION COMPLEXITY: Moderate       GOALS: Goals reviewed with patient?  yes   SHORT TERM GOALS:   Patient will be independent in self management strategies to improve quality of  life and functional outcomes. Baseline: new program Target date: 10/26/2021 Goal status:  INITIAL   2.  Patient will report at least 25% improvement in overall symptoms and/or function to demonstrate improved functional mobility Baseline: 0% Target date: 10/26/2021 Goal status: INITIAL   3.  Patient will be able to balance for at least 45 seconds in narrow BOS without UE support to demonstrate improved static balance. Baseline: see above Target date: 10/26/2021 Goal status: INITIAL           LONG TERM GOALS:   Patient will report at least 50% improvement in overall symptoms and/or function to demonstrate improved functional mobility Baseline: 0% Target date: 11/23/2021 Goal status: INITIAL   2.  Patient will be able to stand for at least 15 minutes to improve functional endurance with standing to prepare a meal Baseline: unable Target date: 11/23/2021 Goal status: INITIAL   3.  Patient will be able to balance in semi tandem for at least 15 seconds with either leg forward to demonstrate improved static balance Baseline: unable Target date: 11/23/2021 Goal status: INITIAL       PLAN: PT FREQUENCY: 2x/week   PT DURATION: 8 weeks   PLANNED INTERVENTIONS: Therapeutic exercises, Therapeutic activity, Neuromuscular re-education, Balance training, Gait training, Patient/Family education, Self Care, Joint mobilization, Joint manipulation, Stair training, Orthotic/Fit training, Aquatic Therapy, Dry Needling, Electrical stimulation, Cryotherapy, Moist heat, Vasopneumatic device, Traction, Ionotophoresis 4mg /ml Dexamethasone, and Manual therapy.   PLAN FOR NEXT SESSION: DOES NOT TOLERATE SUPINE OR LAYING DOWN POSITIONS, Needs assist with bike, FU with making behavorial health referral - pt needs to call MD- redirect for any functional exercises or balance exercises.     Sedalia MutaLauren Jonny Longino, PT, DPT 1:04 PM  10/22/21

## 2021-10-27 ENCOUNTER — Encounter: Payer: Medicare Other | Admitting: Physical Therapy

## 2021-10-27 NOTE — Therapy (Signed)
OUTPATIENT PHYSICAL THERAPY TREATMENT NOTE   Patient Name: Katie Booker MRN: [id number] DOB:02/18/1946, 76 y.o., female Today's Date: 10/29/2021    END OF SESSION:   PT End of Session - 10/29/21 1346     Visit Number 8    Number of Visits 16    Date for PT Re-Evaluation 11/23/21    Authorization Type [employer]    PT Start Time 1300    PT Stop Time 1345    PT Time Calculation (min) 45 min    Activity Tolerance Patient tolerated treatment well    Behavior During Therapy WFL for tasks assessed/performed               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   Depression    DOE (dyspnea on exertion)    GERD    Headache    Hemorrhoids    History of basal cell carcinoma excision    left cheek   History of colon polyps    History of hyperthyroidism 2011   RAI treatement   History of panic attacks    Hyperlipidemia    Hypertension    Hypothyroidism, postradioiodine therapy    followed by pcp   IDA (iron deficiency anemia)    intermittant   Insomnia    OA (osteoarthritis)    OSA (obstructive sleep apnea)    no cpap, per pt tried unable to tolerate   Peripheral neuropathy    legs and feet from hx back surgery's   Rash    bilateral axilla area -- per pt due to some personal wipes used other the counter   RLS (restless legs syndrome)    Spondylolisthesis at L1-L2 level    Tingling of both upper extremities    left greater than right due to cervical pinched nerve   Type 2 diabetes mellitus (HCC)    followed by pcp   Umbilical hernia    Urinary frequency    Urinary urgency    Past Surgical History:  Procedure Laterality Date   ANTERIOR CERVICAL DECOMP/DISCECTOMY FUSION N/A 09/02/2014   Procedure:  Cervical five- six  Anterior cervical decompression fusion;  Surgeon: Barnett Abu, MD;  Location: [facility];  Service: Neurosurgery;  Laterality: N/A;  C5-6 Anterior cervical decompression/diskectomy/fusion   BREAST BIOPSY Right 10/2018   x 2 benign   CARPAL TUNNEL RELEASE Right 2016   CATARACT EXTRACTION W/ INTRAOCULAR LENS  IMPLANT, BILATERAL  03/2015   COLONOSCOPY  2007   ESOPHAGOGASTRODUODENOSCOPY  2007   HEMORRHOID SURGERY  03/23/2011   Procedure: HEMORRHOIDECTOMY;  Surgeon: Clovis Pu. Cornett, MD;  Location: [facility];  Service: General;  Laterality: N/A;  lateral internal sphincterotomy and hemorrhoidectomy   LUMBAR FUSION  02/2015   L1 -- 2   PARTIAL KNEE ARTHROPLASTY Left 12/27/2017   Procedure: UNICOMPARTMENTAL LEFT KNEE;  Surgeon: Teryl Lucy, MD;  Location: [facility];  Service: Orthopedics;  Laterality: Left;   THORACIC DISCECTOMY N/A 12/15/2012   Procedure: Thoracic ten-eleven Thoracic laminectomy;  Surgeon: Barnett Abu, MD;  Location: [facility];  Service: Neurosurgery;  Laterality: N/A;  Thoracic ten-eleven Thoracic laminectomy   TUBAL LIGATION Bilateral 1991  Patient Active Problem List   Diagnosis Date Noted   Morbid obesity (HCC) 01/13/2018   S/P knee replacement 12/27/2017   Spondylolisthesis at L1-L2 level 03/18/2015   Spondylosis with myelopathy 09/02/2014   Cervical spondylosis with myelopathy 09/02/2014   Anxiety 12/21/2012   Chronic back pain 12/21/2012   Insomnia 12/21/2012   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, L2-5 12/15/2012   Spinal stenosis, lumbar region, with neurogenic claudication 10/26/2012   Spinal stenosis, thoracic 10/26/2012   Umbilical hernia 02/25/2012   OSA (obstructive sleep apnea) 11/12/2011   Exogenous obesity 11/12/2011   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   Hypothyroidism 09/18/2009   Diabetes mellitus without complication (HCC) 09/18/2009   Dyslipidemia 09/18/2009   Anemia 09/18/2009   Depression  09/18/2009   Essential hypertension 09/18/2009   GERD 09/18/2009   Primary localized osteoarthritis of left knee 09/18/2009   PCP: Philip Aspen, Minerva Ends   REFERRING PROVIDER: Rodolph Bong, MD     REFERRING DIAG: [phone] (ICD-10-CM) - Pain of left heel   Rationale for Evaluation and Treatment Rehabilitation   THERAPY DIAG:  Bilateral hip pain   Other low back pain   Acute left ankle pain   Muscle weakness (generalized)   Difficulty in walking, not elsewhere classified   ONSET DATE: years   SUBJECTIVE:                                                                                                                                                                                            SUBJECTIVE STATEMENT: 10/29/2021 Pt reports reduced swelling and pain today.    Eval: States she has had chronic back pain. States her last fusion was in 2017 and she was still having pain above and below her previous fusions. States MD would not recommend additional surgery at this time secondary to high risk of infection. States that her pain is increasing and that it is going up her back and into her neck. States she is worried she is getting osteoporosis. States she takes medication every 6 hours for her pain, and continues to have pain before the 6 hours comes.    States she is miserable as she is in constant pain. States then her foot injury happened when she twisted her foot to get her cat off her leg. State she has been having swelling in her left ankle   States that  she feels her balance is getting worse. States she can't cook and stand in the kitchen. States she can't visit with family anymore like she would like to.    States she has been using the walker since her first back surgery.  PERTINENT HISTORY:  left heel pain and Chronic back and hip pain, x2 lumbar fusions L1-5 SP (last fusion 02/2015), cervical surgery, R carpal tunnel, osteopenia, neck pain   PAIN:  Are you  having pain? no: NPRS scale: 0/10 Pain location: left foot, B low back/hip R>L, piriformis muscles Pain description: dull achy Aggravating factors: unsure Relieving factors: unsure      PRECAUTIONS: Fall   WEIGHT BEARING RESTRICTIONS No   FALLS:  Has patient fallen in last 6 months? Yes. Number of falls 4 last one a couple weeks ago   LIVING ENVIRONMENT: Lives with: lives alone Lives in: House/apartment Stairs: No Has following equipment at home: Quad cane small base and , grab bars in bath room, Rolator,    OCCUPATION: not working   PLOF: Independent with household mobility with device, Independent with community mobility with device, and Needs assistance with homemaking   PATIENT GOALS to improve her balance if possible      OBJECTIVE:    DIAGNOSTIC FINDINGS:  09/14/21 US  Impression: Achilles tendinitis and peritendinitis.  Retrocalcaneal bursitis       SCREENING FOR RED FLAGS: Bowel or bladder incontinence: No Spinal tumors: No Cauda equina syndrome: No Compression fracture: No Abdominal aneurysm: No   COGNITION:           Overall cognitive status: Within functional limits for tasks assessed                          SENSATION: Not tested       POSTURE: rounded shoulders, forward head, and flexed trunk , swelling noted throughout left ankle and calf   Balance: Narrow BOS no UE support 27 seconds, unable to stand on either leg   LUMBAR ROM: - not formally tested on this date   Active  A/PROM  eval  Flexion    Extension    Right lateral flexion    Left lateral flexion    Right rotation    Left rotation     (Blank rows = not tested)                  LE Measurements       Lower Extremity Right 09/28/2021 Left 09/28/2021    A/PROM MMT A/PROM MMT  Hip Flexion   4   4  Hip Extension          Hip Abduction          Hip Adduction          Hip Internal rotation          Hip External rotation          Knee Flexion   4-   3+*  Knee Extension   4-    3+*  Ankle Dorsiflexion          Ankle Plantarflexion          Ankle Inversion          Ankle Eversion           (Blank rows = not tested)            * pain     LUMBAR SPECIAL TESTS:  Neg slump test B   FUNCTIONAL TESTS:  STS - uses hands to stand up and sit down   GAIT: Distance walked: 50 feet Assistive device utilized:  rollator Level of assistance: SBA Comments: wide base of support, hips ER, slower gait, unsteady, trendelenburg B, antalgic left  ankle       TODAY'S TREATMENT   10/29/2021 Therapeutic Exercise:    Aerobic: Recumbent bike L1 : 1.25  min, x 4 ;     Supine:     Prone:    Seated:  LAQ 2 x 10 bil , 2.5 lb;  STS x 10, no UE support (from chair) ; 4 way ankle with RTB, heel to toe lift.     Standing: Marching x 20 , Hip abd 2x 10 bil;  Neuromuscular Re-education:   light UE support : L/R weight shifts x 20 Manual Therapy:    Previous: 10/29/2021:  Therapeutic Exercise:    Aerobic: Recumbent bike L1 : 1.25  min, x 4 ;     Supine:     Prone:    Seated:  LAQ 2 x 10 bil , 2.5 lb;  STS x 10, no UE support (from chair) ;     Standing: Marching x 20 , Hip abd 2x 10 bil;  Neuromuscular Re-education:   light UE support : L/R weight shifts x 20;  L/R weight shift with reaching outside of BOS x 10 bil;  Manual Therapy:     PATIENT EDUCATION:  Education details: Discussed swelling of feet, options for positioning to reduce swelling elevation and compression.  Person educated: Patient Education method: Explanation, Demonstration, and Handouts Education comprehension: verbalized understanding     HOME EXERCISE PROGRAM: [id number]   ASSESSMENT:   CLINICAL IMPRESSION: 10/29/2021 Pt tolerated session well today. She was able to perform all prescribed exercises with intermittent seated breaks due to decreased cardiovascular endurance. Pt with tendency to walk on outside of feet, with reduced ankle mobility noted on R>L. Challenged pt with 4 way ankles and some  mobility exercises. Plan to progress standing balance, stability, strength as tolerated.    Previous: Discussed need to stand at walker in front of chair at home, and also to walk in home more often for improving endurance for standing activity. Pts LEs fatigue easily with static standing and balance today. Reports hip pain with walking- will progress hip strength next visit. Discussed implications for balance when using CAM boot for first time, she will bring it to next appt.    Eval: Patient presents with chronic pain in back, hips and legs as well as balance difficulties. Session focused on education and current presentation as well as plan moving forward. Patient would greatly benefit from skilled PT to improve overall function, QOL and reduce risk of falling.     OBJECTIVE IMPAIRMENTS Abnormal gait, decreased activity tolerance, decreased balance, decreased mobility, difficulty walking, decreased ROM, decreased strength, postural dysfunction, and pain.    ACTIVITY LIMITATIONS bending, standing, stairs, transfers, bed mobility, bathing, and locomotion level   PARTICIPATION LIMITATIONS: meal prep, cleaning, and community activity   PERSONAL FACTORS Age, Fitness, and 3+ comorbidities: x2 lumbar fusions, chronic pain, lives a lone, cervical surgery  are also affecting patient's functional outcome.    REHAB POTENTIAL: Good   CLINICAL DECISION MAKING: Evolving/moderate complexity   EVALUATION COMPLEXITY: Moderate       GOALS: Goals reviewed with patient?  yes   SHORT TERM GOALS:   Patient will be independent in self management strategies to improve quality of life and functional outcomes. Baseline: new program Target date: 10/26/2021 Goal status: INITIAL   2.  Patient will report at least 25% improvement in overall symptoms and/or function to demonstrate improved functional mobility Baseline: 0% Target date: 10/26/2021 Goal status: INITIAL   3.  Patient will  be able to balance for  at least 45 seconds in narrow BOS without UE support to demonstrate improved static balance. Baseline: see above Target date: 10/26/2021 Goal status: INITIAL           LONG TERM GOALS:   Patient will report at least 50% improvement in overall symptoms and/or function to demonstrate improved functional mobility Baseline: 0% Target date: 11/23/2021 Goal status: INITIAL   2.  Patient will be able to stand for at least 15 minutes to improve functional endurance with standing to prepare a meal Baseline: unable Target date: 11/23/2021 Goal status: INITIAL   3.  Patient will be able to balance in semi tandem for at least 15 seconds with either leg forward to demonstrate improved static balance Baseline: unable Target date: 11/23/2021 Goal status: INITIAL       PLAN: PT FREQUENCY: 2x/week   PT DURATION: 8 weeks   PLANNED INTERVENTIONS: Therapeutic exercises, Therapeutic activity, Neuromuscular re-education, Balance training, Gait training, Patient/Family education, Self Care, Joint mobilization, Joint manipulation, Stair training, Orthotic/Fit training, Aquatic Therapy, Dry Needling, Electrical stimulation, Cryotherapy, Moist heat, Vasopneumatic device, Traction, Ionotophoresis 4mg /ml Dexamethasone, and Manual therapy.   PLAN FOR NEXT SESSION: DOES NOT TOLERATE SUPINE OR LAYING DOWN POSITIONS, Needs assist with bike, FU with making behavorial health referral - pt needs to call MD- redirect for any functional exercises or balance exercises.     Rudi Heap PT, DPT 10/29/21  1:46 PM

## 2021-10-29 ENCOUNTER — Ambulatory Visit: Payer: Medicare Other | Admitting: Physical Therapy

## 2021-10-29 DIAGNOSIS — M25572 Pain in left ankle and joints of left foot: Secondary | ICD-10-CM | POA: Diagnosis not present

## 2021-10-29 DIAGNOSIS — M6281 Muscle weakness (generalized): Secondary | ICD-10-CM | POA: Diagnosis not present

## 2021-10-29 DIAGNOSIS — M5459 Other low back pain: Secondary | ICD-10-CM | POA: Diagnosis not present

## 2021-10-29 DIAGNOSIS — M25551 Pain in right hip: Secondary | ICD-10-CM

## 2021-10-29 DIAGNOSIS — M25552 Pain in left hip: Secondary | ICD-10-CM | POA: Diagnosis not present

## 2021-10-29 DIAGNOSIS — R262 Difficulty in walking, not elsewhere classified: Secondary | ICD-10-CM

## 2021-11-01 ENCOUNTER — Ambulatory Visit
Admission: RE | Admit: 2021-11-01 | Discharge: 2021-11-01 | Disposition: A | Discharge status: Home / Self Care | Payer: Medicare Other | Source: Ambulatory Visit | Attending: Family Medicine | Admitting: Family Medicine

## 2021-11-01 DIAGNOSIS — M7662 Achilles tendinitis, left leg: Secondary | ICD-10-CM | POA: Diagnosis not present

## 2021-11-01 DIAGNOSIS — M79672 Pain in left foot: Secondary | ICD-10-CM

## 2021-11-01 DIAGNOSIS — M19072 Primary osteoarthritis, left ankle and foot: Secondary | ICD-10-CM | POA: Diagnosis not present

## 2021-11-01 DIAGNOSIS — M7989 Other specified soft tissue disorders: Secondary | ICD-10-CM | POA: Diagnosis not present

## 2021-11-01 DIAGNOSIS — R600 Localized edema: Secondary | ICD-10-CM | POA: Diagnosis not present

## 2021-11-02 ENCOUNTER — Encounter: Payer: Medicare Other | Admitting: Physical Therapy

## 2021-11-02 NOTE — Therapy (Deleted)
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   Depression    DOE (dyspnea on exertion)    GERD    Headache    Hemorrhoids    History of basal cell carcinoma excision    left cheek   History of colon polyps    History of hyperthyroidism 2011   RAI treatement   History of panic attacks    Hyperlipidemia    Hypertension    Hypothyroidism, postradioiodine therapy    followed by pcp   IDA (iron deficiency anemia)    intermittant   Insomnia    OA (osteoarthritis)    OSA (obstructive sleep apnea)    no cpap, per pt tried unable to tolerate   Peripheral neuropathy    legs and feet from hx back surgery's   Rash    bilateral axilla area -- per pt due to some personal wipes used other the counter   RLS (restless legs syndrome)    Spondylolisthesis at L1-L2 level    Tingling of both upper extremities    left greater than right due to cervical pinched nerve   Type 2 diabetes mellitus (HCC)    followed by pcp   Umbilical hernia    Urinary frequency    Urinary urgency    Past Surgical History:  Procedure Laterality Date   ANTERIOR CERVICAL DECOMP/DISCECTOMY FUSION N/A 09/02/2014   Procedure: Cervical five- six  Anterior cervical decompression fusion;  Surgeon: Barnett Abu, MD;  Location: [facility];  Service: Neurosurgery;  Laterality: N/A;  C5-6 Anterior cervical decompression/diskectomy/fusion   BREAST BIOPSY Right 10/2018   x 2 benign   CARPAL TUNNEL RELEASE Right 2016   CATARACT EXTRACTION W/ INTRAOCULAR LENS  IMPLANT, BILATERAL   03/2015   COLONOSCOPY  2007   ESOPHAGOGASTRODUODENOSCOPY  2007   HEMORRHOID SURGERY  03/23/2011   Procedure: HEMORRHOIDECTOMY;  Surgeon: Clovis Pu. Cornett, MD;  Location: [facility];  Service: General;  Laterality: N/A;  lateral internal sphincterotomy and hemorrhoidectomy   LUMBAR FUSION  02/2015   L1 -- 2   PARTIAL KNEE ARTHROPLASTY Left 12/27/2017   Procedure: UNICOMPARTMENTAL LEFT KNEE;  Surgeon: Teryl Lucy, MD;  Location: [facility];  Service: Orthopedics;  Laterality: Left;   THORACIC DISCECTOMY N/A 12/15/2012   Procedure: Thoracic ten-eleven Thoracic laminectomy;  Surgeon: Barnett Abu, MD;  Location: [facility];  Service: Neurosurgery;  Laterality: N/A;  Thoracic ten-eleven Thoracic laminectomy   TUBAL LIGATION Bilateral 1991   Patient Active Problem List   Diagnosis Date Noted   Morbid obesity (HCC) 01/13/2018   S/P knee replacement 12/27/2017   Spondylolisthesis at L1-L2 level 03/18/2015   Spondylosis with myelopathy 09/02/2014   Cervical spondylosis with myelopathy 09/02/2014   Anxiety 12/21/2012   Chronic back pain 12/21/2012   Insomnia 12/21/2012   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, L2-5 12/15/2012   Spinal stenosis, lumbar region, with neurogenic claudication 10/26/2012   Spinal stenosis, thoracic 10/26/2012   Umbilical hernia 02/25/2012  OSA (obstructive sleep apnea) 11/12/2011   Exogenous obesity 11/12/2011   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   Hypothyroidism 09/18/2009   Diabetes mellitus without complication ([place]) [id number]   Dyslipidemia 09/18/2009   Anemia 09/18/2009   Depression 09/18/2009   Essential hypertension 09/18/2009   GERD 09/18/2009   Primary localized osteoarthritis of left knee 09/18/2009   PCP: Isaac Bliss, O[facility] Idler   REFERRING PROVIDER: Gregor Hams, MD     REFERRING DIAG: [phone] (ICD-10-CM) - Pain of left heel   Rationale for Evaluation and Treatment Rehabilitation   THERAPY DIAG:  Bilateral hip pain    Other low back pain   Acute left ankle pain   Muscle weakness (generalized)   Difficulty in walking, not elsewhere classified   ONSET DATE: years   SUBJECTIVE:                                                                                                                                                                                            SUBJECTIVE STATEMENT: 11/02/2021 Pt reports reduced swelling and pain today.    Eval: States she has had chronic back pain. States her last fusion was in 2017 and she was still having pain above and below her previous fusions. States MD would not recommend additional surgery at this time secondary to high risk of infection. States that her pain is increasing and that it is going up her back and into her neck. States she is worried she is getting osteoporosis. States she takes medication every 6 hours for her pain, and continues to have pain before the 6 hours comes.    States she is miserable as she is in constant pain. States then her foot injury happened when she twisted her foot to get her cat off her leg. State she has been having swelling in her left ankle   States that  she feels her balance is getting worse. States she can't cook and stand in the kitchen. States she can't visit with family anymore like she would like to.    States she has been using the walker since her first back surgery.      PERTINENT HISTORY:  left heel pain and Chronic back and hip pain, x2 lumbar fusions L1-5 SP (last fusion 02/2015), cervical surgery, R carpal tunnel, osteopenia, neck pain   PAIN:  Are you having pain? no: NPRS scale: 0/10 Pain location: left foot, B low back/hip R>L, piriformis muscles Pain description: dull achy Aggravating factors: unsure Relieving factors: unsure      PRECAUTIONS: Fall   WEIGHT BEARING RESTRICTIONS No   FALLS:  Has patient fallen in last 6  months? Yes. Number of falls 4 last one a couple weeks ago   LIVING  ENVIRONMENT: Lives with: lives alone Lives in: House/apartment Stairs: No Has following equipment at home: Quad cane small base and , grab bars in bath room, Rolator,    OCCUPATION: not working   PLOF: Independent with household mobility with device, Independent with community mobility with device, and Needs assistance with homemaking   PATIENT GOALS to improve her balance if possible      OBJECTIVE:    DIAGNOSTIC FINDINGS:  09/14/21 US  Impression: Achilles tendinitis and peritendinitis.  Retrocalcaneal bursitis       SCREENING FOR RED FLAGS: Bowel or bladder incontinence: No Spinal tumors: No Cauda equina syndrome: No Compression fracture: No Abdominal aneurysm: No   COGNITION:           Overall cognitive status: Within functional limits for tasks assessed                          SENSATION: Not tested       POSTURE: rounded shoulders, forward head, and flexed trunk , swelling noted throughout left ankle and calf   Balance: Narrow BOS no UE support 27 seconds, unable to stand on either leg   LUMBAR ROM: - not formally tested on this date   Active  A/PROM  eval  Flexion    Extension    Right lateral flexion    Left lateral flexion    Right rotation    Left rotation     (Blank rows = not tested)                  LE Measurements       Lower Extremity Right 09/28/2021 Left 09/28/2021    A/PROM MMT A/PROM MMT  Hip Flexion   4   4  Hip Extension          Hip Abduction          Hip Adduction          Hip Internal rotation          Hip External rotation          Knee Flexion   4-   3+*  Knee Extension   4-   3+*  Ankle Dorsiflexion          Ankle Plantarflexion          Ankle Inversion          Ankle Eversion           (Blank rows = not tested)            * pain     LUMBAR SPECIAL TESTS:  Neg slump test B   FUNCTIONAL TESTS:  STS - uses hands to stand up and sit down   GAIT: Distance walked: 50 feet Assistive device utilized:  rollator Level  of assistance: SBA Comments: wide base of support, hips ER, slower gait, unsteady, trendelenburg B, antalgic left ankle       TODAY'S TREATMENT   11/02/2021 Therapeutic Exercise:    Aerobic: Recumbent bike L1 : 1.25  min, x 4 ;     Supine:     Prone:    Seated:  LAQ 2 x 10 bil , 2.5 lb;  STS x 10, no UE support (from chair) ; 4 way ankle with RTB, heel to toe lift.     Standing: Marching x 20 , Hip abd 2x 10  bil;  Neuromuscular Re-education:   light UE support : L/R weight shifts x 20 Manual Therapy:    Previous: 10/29/2021:  Therapeutic Exercise:    Aerobic: Recumbent bike L1 : 1.25  min, x 4 ;     Supine:     Prone:    Seated:  LAQ 2 x 10 bil , 2.5 lb;  STS x 10, no UE support (from chair) ;     Standing: Marching x 20 , Hip abd 2x 10 bil;  Neuromuscular Re-education:   light UE support : L/R weight shifts x 20;  L/R weight shift with reaching outside of BOS x 10 bil;  Manual Therapy:     PATIENT EDUCATION:  Education details: Discussed swelling of feet, options for positioning to reduce swelling elevation and compression.  Person educated: Patient Education method: Explanation, Demonstration, and Handouts Education comprehension: verbalized understanding     HOME EXERCISE PROGRAM: [id number]   ASSESSMENT:   CLINICAL IMPRESSION: 11/02/2021 Pt tolerated session well today. She was able to perform all prescribed exercises with intermittent seated breaks due to decreased cardiovascular endurance. Pt with tendency to walk on outside of feet, with reduced ankle mobility noted on R>L. Challenged pt with 4 way ankles and some mobility exercises. Plan to progress standing balance, stability, strength as tolerated.    Previous: Discussed need to stand at walker in front of chair at home, and also to walk in home more often for improving endurance for standing activity. Pts LEs fatigue easily with static standing and balance today. Reports hip pain with walking- will progress hip  strength next visit. Discussed implications for balance when using CAM boot for first time, she will bring it to next appt.    Eval: Patient presents with chronic pain in back, hips and legs as well as balance difficulties. Session focused on education and current presentation as well as plan moving forward. Patient would greatly benefit from skilled PT to improve overall function, QOL and reduce risk of falling.     OBJECTIVE IMPAIRMENTS Abnormal gait, decreased activity tolerance, decreased balance, decreased mobility, difficulty walking, decreased ROM, decreased strength, postural dysfunction, and pain.    ACTIVITY LIMITATIONS bending, standing, stairs, transfers, bed mobility, bathing, and locomotion level   PARTICIPATION LIMITATIONS: meal prep, cleaning, and community activity   PERSONAL FACTORS Age, Fitness, and 3+ comorbidities: x2 lumbar fusions, chronic pain, lives a lone, cervical surgery  are also affecting patient's functional outcome.    REHAB POTENTIAL: Good   CLINICAL DECISION MAKING: Evolving/moderate complexity   EVALUATION COMPLEXITY: Moderate       GOALS: Goals reviewed with patient?  yes   SHORT TERM GOALS:   Patient will be independent in self management strategies to improve quality of life and functional outcomes. Baseline: new program Target date: 10/26/2021 Goal status: INITIAL   2.  Patient will report at least 25% improvement in overall symptoms and/or function to demonstrate improved functional mobility Baseline: 0% Target date: 10/26/2021 Goal status: INITIAL   3.  Patient will be able to balance for at least 45 seconds in narrow BOS without UE support to demonstrate improved static balance. Baseline: see above Target date: 10/26/2021 Goal status: INITIAL           LONG TERM GOALS:   Patient will report at least 50% improvement in overall symptoms and/or function to demonstrate improved functional mobility Baseline: 0% Target date:  11/23/2021 Goal status: INITIAL   2.  Patient will be able to stand for at least  15 minutes to improve functional endurance with standing to prepare a meal Baseline: unable Target date: 11/23/2021 Goal status: INITIAL   3.  Patient will be able to balance in semi tandem for at least 15 seconds with either leg forward to demonstrate improved static balance Baseline: unable Target date: 11/23/2021 Goal status: INITIAL       PLAN: PT FREQUENCY: 2x/week   PT DURATION: 8 weeks   PLANNED INTERVENTIONS: Therapeutic exercises, Therapeutic activity, Neuromuscular re-education, Balance training, Gait training, Patient/Family education, Self Care, Joint mobilization, Joint manipulation, Stair training, Orthotic/Fit training, Aquatic Therapy, Dry Needling, Electrical stimulation, Cryotherapy, Moist heat, Vasopneumatic device, Traction, Ionotophoresis 4mg /ml Dexamethasone, and Manual therapy.   PLAN FOR NEXT SESSION: DOES NOT TOLERATE SUPINE OR LAYING DOWN POSITIONS, Needs assist with bike, FU with making behavorial health referral - pt needs to call MD- redirect for any functional exercises or balance exercises.     Rudi Heap PT, DPT 11/02/21  8:30 AM

## 2021-11-03 ENCOUNTER — Telehealth: Payer: Self-pay | Admitting: Family Medicine

## 2021-11-03 DIAGNOSIS — S86012A Strain of left Achilles tendon, initial encounter: Secondary | ICD-10-CM

## 2021-11-03 NOTE — Telephone Encounter (Signed)
Orthopedic surgery referral placed 

## 2021-11-03 NOTE — Progress Notes (Signed)
MRI confirms rupture of the Achilles tendon with 4.6 cm of retraction.  I have referred you to an orthopedic surgeon named Dr. Lajoyce Corners who specializes in foot and ankle surgery.  You should hear soon about scheduling an appointment with him.  Often these injuries will require surgery however there is been some evidence and that sometimes we can get away without surgery.  In the meantime continue the cam walker boot.

## 2021-11-03 NOTE — Telephone Encounter (Signed)
Called patient to reschedule Colonoscopy leave voice mail.

## 2021-11-03 NOTE — Therapy (Signed)
OUTPATIENT PHYSICAL THERAPY TREATMENT NOTE   Patient Name: Katie Booker MRN: [id number] DOB:1945/09/26, 76 y.o., female Today's Date: 11/05/2021    END OF SESSION:   PT End of Session - 11/05/21 1328     Visit Number 9    Number of Visits 16    Date for PT Re-Evaluation 11/23/21    Authorization Type [employer]    PT Start Time 1309    PT Stop Time 1355    PT Time Calculation (min) 46 min    Activity Tolerance Patient tolerated treatment well    Behavior During Therapy WFL for tasks assessed/performed                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   Depression    DOE (dyspnea on exertion)    GERD    Headache    Hemorrhoids    History of basal cell carcinoma excision    left cheek   History of colon polyps    History of hyperthyroidism 2011   RAI treatement   History of panic attacks    Hyperlipidemia    Hypertension    Hypothyroidism, postradioiodine therapy    followed by pcp   IDA (iron deficiency anemia)    intermittant   Insomnia    OA (osteoarthritis)    OSA (obstructive sleep apnea)    no cpap, per pt tried unable to tolerate   Peripheral neuropathy    legs and feet from hx back surgery's   Rash    bilateral axilla area -- per pt due to some personal wipes used other the counter   RLS (restless legs syndrome)    Spondylolisthesis at L1-L2 level    Tingling of both upper extremities    left greater than right due to cervical pinched nerve   Type 2 diabetes mellitus (HCC)    followed by pcp   Umbilical hernia    Urinary frequency    Urinary urgency    Past Surgical History:  Procedure Laterality Date   ANTERIOR CERVICAL DECOMP/DISCECTOMY FUSION N/A 09/02/2014   Procedure:  Cervical five- six  Anterior cervical decompression fusion;  Surgeon: Barnett Abu, MD;  Location: [facility];  Service: Neurosurgery;  Laterality: N/A;  C5-6 Anterior cervical decompression/diskectomy/fusion   BREAST BIOPSY Right 10/2018   x 2 benign   CARPAL TUNNEL RELEASE Right 2016   CATARACT EXTRACTION W/ INTRAOCULAR LENS  IMPLANT, BILATERAL  03/2015   COLONOSCOPY  2007   ESOPHAGOGASTRODUODENOSCOPY  2007   HEMORRHOID SURGERY  03/23/2011   Procedure: HEMORRHOIDECTOMY;  Surgeon: Clovis Pu. Cornett, MD;  Location: [facility];  Service: General;  Laterality: N/A;  lateral internal sphincterotomy and hemorrhoidectomy   LUMBAR FUSION  02/2015   L1 -- 2   PARTIAL KNEE ARTHROPLASTY Left 12/27/2017   Procedure: UNICOMPARTMENTAL LEFT KNEE;  Surgeon: Teryl Lucy, MD;  Location: [facility];  Service: Orthopedics;  Laterality: Left;   THORACIC DISCECTOMY N/A 12/15/2012   Procedure: Thoracic ten-eleven Thoracic laminectomy;  Surgeon: Barnett Abu, MD;  Location: [facility];  Service: Neurosurgery;  Laterality: N/A;  Thoracic ten-eleven Thoracic laminectomy   TUBAL LIGATION Bilateral 1991  Patient Active Problem List   Diagnosis Date Noted   Complete rupture of left Achilles tendon 11/03/2021   Morbid obesity (HCC) 01/13/2018   S/P knee replacement 12/27/2017   Spondylolisthesis at L1-L2 level 03/18/2015   Spondylosis with myelopathy 09/02/2014   Cervical spondylosis with myelopathy 09/02/2014   Anxiety 12/21/2012   Chronic back pain 12/21/2012   Insomnia 12/21/2012   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, L2-5 12/15/2012   Spinal stenosis, lumbar region, with neurogenic claudication 10/26/2012   Spinal stenosis, thoracic 10/26/2012   Umbilical hernia 02/25/2012   OSA (obstructive sleep apnea) 11/12/2011   Exogenous obesity 11/12/2011   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   Hypothyroidism 09/18/2009   Diabetes mellitus without complication (HCC) 09/18/2009   Dyslipidemia  09/18/2009   Anemia 09/18/2009   Depression 09/18/2009   Essential hypertension 09/18/2009   GERD 09/18/2009   Primary localized osteoarthritis of left knee 09/18/2009   PCP: Philip Aspen, Minerva Ends   REFERRING PROVIDER: Rodolph Bong, MD     REFERRING DIAG: [phone] (ICD-10-CM) - Pain of left heel   Rationale for Evaluation and Treatment Rehabilitation   THERAPY DIAG:  Bilateral hip pain   Other low back pain   Acute left ankle pain   Muscle weakness (generalized)   Difficulty in walking, not elsewhere classified  Localized edema.    ONSET DATE: years   SUBJECTIVE:                                                                                                                                                                                            SUBJECTIVE STATEMENT: 11/05/2021 Pt reports reduced swelling and pain today. She is very concerned about her MRI results and outcomes.    Eval: States she has had chronic back pain. States her last fusion was in 2017 and she was still having pain above and below her previous fusions. States MD would not recommend additional surgery at this time secondary to high risk of infection. States that her pain is increasing and that it is going up her back and into her neck. States she is worried she is getting osteoporosis. States she takes medication every 6 hours for her pain, and continues to have pain before the 6 hours comes.    States she is miserable as she is in constant pain. States then her foot injury happened when she twisted her foot to get her cat off her leg. State she has been having swelling in her left ankle   States that  she feels her balance is getting worse. States she can't cook and stand in the kitchen. States she can't visit with  family anymore like she would like to.    States she has been using the walker since her first back surgery.      PERTINENT HISTORY:  left heel pain and Chronic back and hip pain,  x2 lumbar fusions L1-5 SP (last fusion 02/2015), cervical surgery, R carpal tunnel, osteopenia, neck pain   PAIN:  Are you having pain? no: NPRS scale: 0/10 Pain location: left foot, B low back/hip R>L, piriformis muscles Pain description: dull achy Aggravating factors: unsure Relieving factors: unsure      PRECAUTIONS: Fall   WEIGHT BEARING RESTRICTIONS No   FALLS:  Has patient fallen in last 6 months? Yes. Number of falls 4 last one a couple weeks ago   LIVING ENVIRONMENT: Lives with: lives alone Lives in: House/apartment Stairs: No Has following equipment at home: Quad cane small base and , grab bars in bath room, Rolator,    OCCUPATION: not working   PLOF: Independent with household mobility with device, Independent with community mobility with device, and Needs assistance with homemaking   PATIENT GOALS to improve her balance if possible      OBJECTIVE:    DIAGNOSTIC FINDINGS:  09/14/21 US  Impression: Achilles tendinitis and peritendinitis.  Retrocalcaneal bursitis       SCREENING FOR RED FLAGS: Bowel or bladder incontinence: No Spinal tumors: No Cauda equina syndrome: No Compression fracture: No Abdominal aneurysm: No   COGNITION:           Overall cognitive status: Within functional limits for tasks assessed                          SENSATION: Not tested       POSTURE: rounded shoulders, forward head, and flexed trunk , swelling noted throughout left ankle and calf   Balance: Narrow BOS no UE support 27 seconds, unable to stand on either leg   LUMBAR ROM: - not formally tested on this date   Active  A/PROM  eval  Flexion    Extension    Right lateral flexion    Left lateral flexion    Right rotation    Left rotation     (Blank rows = not tested)                  LE Measurements       Lower Extremity Right 09/28/2021 Left 09/28/2021    A/PROM MMT A/PROM MMT  Hip Flexion   4   4  Hip Extension          Hip Abduction          Hip  Adduction          Hip Internal rotation          Hip External rotation          Knee Flexion   4-   3+*  Knee Extension   4-   3+*  Ankle Dorsiflexion          Ankle Plantarflexion          Ankle Inversion          Ankle Eversion           (Blank rows = not tested)            * pain     LUMBAR SPECIAL TESTS:  Neg slump test B   FUNCTIONAL TESTS:  STS - uses hands to stand up and sit down   GAIT:  Distance walked: 50 feet Assistive device utilized:  rollator Level of assistance: SBA Comments: wide base of support, hips ER, slower gait, unsteady, trendelenburg B, antalgic left ankle       TODAY'S TREATMENT   11/05/2021  Today's session spent on discussing current MRI results, POC and outcomes.   Vaso  11/05/2021 Therapeutic Exercise:    Aerobic: Recumbent bike L1 : 1.25  min, x 4 ;     Supine:     Prone:    Seated:  LAQ 2 x 10 bil , 2.5 lb;  STS x 10, no UE support (from chair) ; 4 way ankle with RTB, heel to toe lift.     Standing: Marching x 20 , Hip abd 2x 10 bil;  Neuromuscular Re-education:   light UE support : L/R weight shifts x 20 Manual Therapy:    Previous: 10/29/2021:  Therapeutic Exercise:    Aerobic: Recumbent bike L1 : 1.25  min, x 4 ;     Supine:     Prone:    Seated:  LAQ 2 x 10 bil , 2.5 lb;  STS x 10, no UE support (from chair) ;     Standing: Marching x 20 , Hip abd 2x 10 bil;  Neuromuscular Re-education:   light UE support : L/R weight shifts x 20;  L/R weight shift with reaching outside of BOS x 10 bil;  Manual Therapy:     PATIENT EDUCATION:  Education details: Discussed swelling of feet, options for positioning to reduce swelling elevation and compression.  Person educated: Patient Education method: Explanation, Demonstration, and Handouts Education comprehension: verbalized understanding     HOME EXERCISE PROGRAM: [id number]   ASSESSMENT:   CLINICAL IMPRESSION: 11/05/2021 Majority of session with discussion on MRI results. Pt is  very concerned about getting surgery and would like to know what alternatives she has at this time. Deferred pt to speak with Dr Sharol Given next week, but encouraged her to continue with PT to continue with strengthening, proprioception and balance. Pt also inquired about wearing surgical boot. Encouraged pt to put her safety first with recent concerns with instability. Pt states that her daughter got her a surgical boot and ankle/ leg massager. Finished session with vaso-compression with low pressure and 43 degrees. Provided pt with HEP to include ice, elevation, ankle pumps and towel scrunches. Pt departs session with reduced swelling. Plan to continue with ankle strengthening, balance and gait.    Previous: Discussed need to stand at walker in front of chair at home, and also to walk in home more often for improving endurance for standing activity. Pts LEs fatigue easily with static standing and balance today. Reports hip pain with walking- will progress hip strength next visit. Discussed implications for balance when using CAM boot for first time, she will bring it to next appt.    Eval: Patient presents with chronic pain in back, hips and legs as well as balance difficulties. Session focused on education and current presentation as well as plan moving forward. Patient would greatly benefit from skilled PT to improve overall function, QOL and reduce risk of falling.     OBJECTIVE IMPAIRMENTS Abnormal gait, decreased activity tolerance, decreased balance, decreased mobility, difficulty walking, decreased ROM, decreased strength, postural dysfunction, and pain.    ACTIVITY LIMITATIONS bending, standing, stairs, transfers, bed mobility, bathing, and locomotion level   PARTICIPATION LIMITATIONS: meal prep, cleaning, and community activity   PERSONAL FACTORS Age, Fitness, and 3+ comorbidities: x2 lumbar fusions, chronic pain, lives a  lone, cervical surgery  are also affecting patient's functional outcome.     REHAB POTENTIAL: Good   CLINICAL DECISION MAKING: Evolving/moderate complexity   EVALUATION COMPLEXITY: Moderate       GOALS: Goals reviewed with patient?  yes   SHORT TERM GOALS:   Patient will be independent in self management strategies to improve quality of life and functional outcomes. Baseline: new program Target date: 10/26/2021 Goal status: INITIAL   2.  Patient will report at least 25% improvement in overall symptoms and/or function to demonstrate improved functional mobility Baseline: 0% Target date: 10/26/2021 Goal status: INITIAL   3.  Patient will be able to balance for at least 45 seconds in narrow BOS without UE support to demonstrate improved static balance. Baseline: see above Target date: 10/26/2021 Goal status: INITIAL           LONG TERM GOALS:   Patient will report at least 50% improvement in overall symptoms and/or function to demonstrate improved functional mobility Baseline: 0% Target date: 11/23/2021 Goal status: INITIAL   2.  Patient will be able to stand for at least 15 minutes to improve functional endurance with standing to prepare a meal Baseline: unable Target date: 11/23/2021 Goal status: INITIAL   3.  Patient will be able to balance in semi tandem for at least 15 seconds with either leg forward to demonstrate improved static balance Baseline: unable Target date: 11/23/2021 Goal status: INITIAL       PLAN: PT FREQUENCY: 2x/week   PT DURATION: 8 weeks   PLANNED INTERVENTIONS: Therapeutic exercises, Therapeutic activity, Neuromuscular re-education, Balance training, Gait training, Patient/Family education, Self Care, Joint mobilization, Joint manipulation, Stair training, Orthotic/Fit training, Aquatic Therapy, Dry Needling, Electrical stimulation, Cryotherapy, Moist heat, Vasopneumatic device, Traction, Ionotophoresis 4mg /ml Dexamethasone, and Manual therapy.   PLAN FOR NEXT SESSION: DOES NOT TOLERATE SUPINE OR LAYING DOWN POSITIONS,  Needs assist with bike, FU with making behavorial health referral - pt needs to call MD- redirect for any functional exercises or balance exercises.     Rudi Heap PT, DPT 11/05/21  2:57 PM

## 2021-11-05 ENCOUNTER — Ambulatory Visit: Payer: Medicare Other | Admitting: Physical Therapy

## 2021-11-05 DIAGNOSIS — M25551 Pain in right hip: Secondary | ICD-10-CM

## 2021-11-05 DIAGNOSIS — M6281 Muscle weakness (generalized): Secondary | ICD-10-CM

## 2021-11-05 DIAGNOSIS — M5459 Other low back pain: Secondary | ICD-10-CM

## 2021-11-05 DIAGNOSIS — R600 Localized edema: Secondary | ICD-10-CM | POA: Diagnosis not present

## 2021-11-05 DIAGNOSIS — M25572 Pain in left ankle and joints of left foot: Secondary | ICD-10-CM | POA: Diagnosis not present

## 2021-11-05 DIAGNOSIS — R262 Difficulty in walking, not elsewhere classified: Secondary | ICD-10-CM | POA: Diagnosis not present

## 2021-11-05 DIAGNOSIS — M25552 Pain in left hip: Secondary | ICD-10-CM

## 2021-11-07 ENCOUNTER — Other Ambulatory Visit: Payer: Self-pay | Admitting: Internal Medicine

## 2021-11-09 ENCOUNTER — Ambulatory Visit: Payer: Medicare Other | Admitting: Physical Therapy

## 2021-11-09 ENCOUNTER — Encounter: Payer: Self-pay | Admitting: Physical Therapy

## 2021-11-09 DIAGNOSIS — M25552 Pain in left hip: Secondary | ICD-10-CM | POA: Diagnosis not present

## 2021-11-09 DIAGNOSIS — M6281 Muscle weakness (generalized): Secondary | ICD-10-CM | POA: Diagnosis not present

## 2021-11-09 DIAGNOSIS — M25551 Pain in right hip: Secondary | ICD-10-CM

## 2021-11-09 DIAGNOSIS — R262 Difficulty in walking, not elsewhere classified: Secondary | ICD-10-CM | POA: Diagnosis not present

## 2021-11-09 DIAGNOSIS — M5459 Other low back pain: Secondary | ICD-10-CM | POA: Diagnosis not present

## 2021-11-09 DIAGNOSIS — M25572 Pain in left ankle and joints of left foot: Secondary | ICD-10-CM

## 2021-11-09 NOTE — Therapy (Addendum)
OUTPATIENT PHYSICAL THERAPY TREATMENT NOTE   Patient Name: Katie Booker MRN: [id number] DOB:[birth year], 76 y.o., female Today's Date: 11/09/2021    END OF SESSION:   PT End of Session - 11/09/21 1429     Visit Number 10    Number of Visits 16    Date for PT Re-Evaluation 11/23/21    Authorization Type [employer]    PT Start Time 1430    PT Stop Time 1515    PT Time Calculation (min) 45 min    Activity Tolerance Patient tolerated treatment well    Behavior During Therapy WFL for tasks assessed/performed                 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   Depression    DOE (dyspnea on exertion)    GERD    Headache    Hemorrhoids    History of basal cell carcinoma excision    left cheek   History of colon polyps    History of hyperthyroidism 2011   RAI treatement   History of panic attacks    Hyperlipidemia    Hypertension    Hypothyroidism, postradioiodine therapy    followed by pcp   IDA (iron deficiency anemia)    intermittant   Insomnia    OA (osteoarthritis)    OSA (obstructive sleep apnea)    no cpap, per pt tried unable to tolerate   Peripheral neuropathy    legs and feet from hx back surgery's   Rash    bilateral axilla area -- per pt due to some personal wipes used other the counter   RLS (restless legs syndrome)    Spondylolisthesis at L1-L2 level    Tingling of both upper extremities    left greater than right due to cervical pinched nerve   Type 2 diabetes mellitus (HCC)    followed by pcp   Umbilical hernia    Urinary frequency    Urinary urgency    Past Surgical History:  Procedure Laterality Date   ANTERIOR CERVICAL DECOMP/DISCECTOMY FUSION N/A 09/02/2014    Procedure: Cervical five- six  Anterior cervical decompression fusion;  Surgeon: Barnett Abu, MD;  Location: [facility];  Service: Neurosurgery;  Laterality: N/A;  C5-6 Anterior cervical decompression/diskectomy/fusion   BREAST BIOPSY Right 10/2018   x 2 benign   CARPAL TUNNEL RELEASE Right 2016   CATARACT EXTRACTION W/ INTRAOCULAR LENS  IMPLANT, BILATERAL  03/2015   COLONOSCOPY  2007   ESOPHAGOGASTRODUODENOSCOPY  2007   HEMORRHOID SURGERY  03/23/2011   Procedure: HEMORRHOIDECTOMY;  Surgeon: Clovis Pu. Cornett, MD;  Location: [facility];  Service: General;  Laterality: N/A;  lateral internal sphincterotomy and hemorrhoidectomy   LUMBAR FUSION  02/2015   L1 -- 2   PARTIAL KNEE ARTHROPLASTY Left 12/27/2017   Procedure: UNICOMPARTMENTAL LEFT KNEE;  Surgeon: Teryl Lucy, MD;  Location: [facility];  Service: Orthopedics;  Laterality: Left;   THORACIC DISCECTOMY N/A 12/15/2012   Procedure: Thoracic ten-eleven Thoracic laminectomy;  Surgeon: Barnett Abu, MD;  Location: [facility];  Service: Neurosurgery;  Laterality: N/A;  Thoracic ten-eleven Thoracic laminectomy   TUBAL LIGATION Bilateral  1991   Patient Active Problem List   Diagnosis Date Noted   Complete rupture of left Achilles tendon 11/03/2021   Morbid obesity (HCC) 01/13/2018   S/P knee replacement 12/27/2017   Spondylolisthesis at L1-L2 level 03/18/2015   Spondylosis with myelopathy 09/02/2014   Cervical spondylosis with myelopathy 09/02/2014   Anxiety 12/21/2012   Chronic back pain 12/21/2012   Insomnia 12/21/2012   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, L2-5 12/15/2012   Spinal stenosis, lumbar region, with neurogenic claudication 10/26/2012   Spinal stenosis, thoracic 10/26/2012   Umbilical hernia 02/25/2012   OSA (obstructive sleep apnea) 11/12/2011   Exogenous obesity 11/12/2011   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   Hypothyroidism 09/18/2009   Diabetes mellitus without complication (HCC) 09/18/2009    Dyslipidemia 09/18/2009   Anemia 09/18/2009   Depression 09/18/2009   Essential hypertension 09/18/2009   GERD 09/18/2009   Primary localized osteoarthritis of left knee 09/18/2009   PCP: Philip AspenHernandez Acosta, Minerva EndsEstela   REFERRING PROVIDER: Rodolph Bongorey, Evan S, MD     REFERRING DIAG: [phone] (ICD-10-CM) - Pain of left heel   Rationale for Evaluation and Treatment Rehabilitation   THERAPY DIAG:  Bilateral hip pain   Other low back pain   Acute left ankle pain   Muscle weakness (generalized)   Difficulty in walking, not elsewhere classified  Localized edema.    ONSET DATE: years   SUBJECTIVE:                                                                                                                                                                                            SUBJECTIVE STATEMENT: 11/09/2021 No new complaints.    Eval: States she has had chronic back pain. States her last fusion was in 2017 and she was still having pain above and below her previous fusions. States MD would not recommend additional surgery at this time secondary to high risk of infection. States that her pain is increasing and that it is going up her back and into her neck. States she is worried she is getting osteoporosis. States she takes medication every 6 hours for her pain, and continues to have pain before the 6 hours comes.    States she is miserable as she is in constant pain. States then her foot injury happened when she twisted her foot to get her cat off her leg. State she has been having swelling in her left ankle   States that  she feels her balance is getting worse. States she can't cook and stand in the kitchen. States she can't visit with family anymore like she would like to.    States  she has been using the walker since her first back surgery.      PERTINENT HISTORY:  left heel pain and Chronic back and hip pain, x2 lumbar fusions L1-5 SP (last fusion 02/2015), cervical surgery, R  carpal tunnel, osteopenia, neck pain   PAIN:  Are you having pain? no: NPRS scale: 0/10 Pain location: left foot, B low back/hip R>L, piriformis muscles Pain description: dull achy Aggravating factors: unsure Relieving factors: unsure      PRECAUTIONS: Fall   WEIGHT BEARING RESTRICTIONS No   FALLS:  Has patient fallen in last 6 months? Yes. Number of falls 4 last one a couple weeks ago   LIVING ENVIRONMENT: Lives with: lives alone Lives in: House/apartment Stairs: No Has following equipment at home: Quad cane small base and , grab bars in bath room, Rolator,    OCCUPATION: not working   PLOF: Independent with household mobility with device, Independent with community mobility with device, and Needs assistance with homemaking   PATIENT GOALS to improve her balance if possible      OBJECTIVE:    DIAGNOSTIC FINDINGS:  09/14/21 US  Impression: Achilles tendinitis and peritendinitis.  Retrocalcaneal bursitis       SCREENING FOR RED FLAGS: Bowel or bladder incontinence: No Spinal tumors: No Cauda equina syndrome: No Compression fracture: No Abdominal aneurysm: No   COGNITION:           Overall cognitive status: Within functional limits for tasks assessed                          SENSATION: Not tested       POSTURE: rounded shoulders, forward head, and flexed trunk , swelling noted throughout left ankle and calf   Balance: Narrow BOS no UE support 27 seconds, unable to stand on either leg   LUMBAR ROM: - not formally tested on this date   Active  A/PROM  eval  Flexion    Extension    Right lateral flexion    Left lateral flexion    Right rotation    Left rotation     (Blank rows = not tested)                  LE Measurements       Lower Extremity Right 09/28/2021 Left 09/28/2021    A/PROM MMT A/PROM MMT  Hip Flexion   4   4  Hip Extension          Hip Abduction          Hip Adduction          Hip Internal rotation          Hip External rotation           Knee Flexion   4-   3+*  Knee Extension   4-   3+*  Ankle Dorsiflexion          Ankle Plantarflexion          Ankle Inversion          Ankle Eversion           (Blank rows = not tested)            * pain     LUMBAR SPECIAL TESTS:  Neg slump test B   FUNCTIONAL TESTS:  STS - uses hands to stand up and sit down   GAIT: Distance walked: 50 feet Assistive device utilized:  rollator Level of  assistance: SBA Comments: wide base of support, hips ER, slower gait, unsteady, trendelenburg B, antalgic left ankle       TODAY'S TREATMENT   11/09/2021 Therapeutic Exercise:    Aerobic: Recumbent bike L1 : 2  min, x 2 ;     Supine:     Prone:    Seated:      Standing: Standing in front of chair 1 min x 2; 62min x 1;   Walking with RW 57ft x6 with cuing for heel to toe gait. ; Pre-gait weight shifts- staggered stance x 15 bil;  Neuromuscular Re-education:    Manual Therapy:     Previous: 10/29/2021:  Therapeutic Exercise:    Aerobic: Recumbent bike L1 : 1.25  min, x 4 ;     Supine:     Prone:    Seated:  LAQ 2 x 10 bil , 2.5 lb;  STS x 10, no UE support (from chair) ;     Standing: Marching x 20 , Hip abd 2x 10 bil;  Neuromuscular Re-education:   light UE support : L/R weight shifts x 20;  L/R weight shift with reaching outside of BOS x 10 bil;  Manual Therapy:     PATIENT EDUCATION:  Education details: reviewed HEP Person educated: Patient Education method: Explanation, Demonstration, and Handouts Education comprehension: verbalized understanding     HOME EXERCISE PROGRAM: [id number]   ASSESSMENT:   CLINICAL IMPRESSION: 11/09/2021 Discussed heel lifts, pt will bring other shoes in next visit to fit it into. She will have MD f/u this week , will discuss use of boot. Pt very tired with standing, bike, and walking today, requires rest breaks after only 1- 23min of activity. Discussed need to increase activity at home. Pt with decreased ability for heel to toe gait, with  increased lateral and posterior pressure on feet. She is getting increased pain in L heel, possbily due to increased weight bearing here. Pt to benefit from continued work on mobility, endurance and strength.    Majority of session with discussion on MRI results. Pt is very concerned about getting surgery and would like to know what alternatives she has at this time. Deferred pt to speak with Dr Sharol Given next week, but encouraged her to continue with PT to continue with strengthening, proprioception and balance. Pt also inquired about wearing surgical boot. Encouraged pt to put her safety first with recent concerns with instability. Pt states that her daughter got her a surgical boot and ankle/ leg massager. Finished session with vaso-compression with low pressure and 43 degrees. Provided pt with HEP to include ice, elevation, ankle pumps and towel scrunches. Pt departs session with reduced swelling. Plan to continue with ankle strengthening, balance and gait.    Previous: Discussed need to stand at walker in front of chair at home, and also to walk in home more often for improving endurance for standing activity. Pts LEs fatigue easily with static standing and balance today. Reports hip pain with walking- will progress hip strength next visit. Discussed implications for balance when using CAM boot for first time, she will bring it to next appt.    Eval: Patient presents with chronic pain in back, hips and legs as well as balance difficulties. Session focused on education and current presentation as well as plan moving forward. Patient would greatly benefit from skilled PT to improve overall function, QOL and reduce risk of falling.     OBJECTIVE IMPAIRMENTS Abnormal gait, decreased activity tolerance, decreased balance, decreased mobility,  difficulty walking, decreased ROM, decreased strength, postural dysfunction, and pain.    ACTIVITY LIMITATIONS bending, standing, stairs, transfers, bed mobility,  bathing, and locomotion level   PARTICIPATION LIMITATIONS: meal prep, cleaning, and community activity   PERSONAL FACTORS Age, Fitness, and 3+ comorbidities: x2 lumbar fusions, chronic pain, lives a lone, cervical surgery  are also affecting patient's functional outcome.    REHAB POTENTIAL: Good   CLINICAL DECISION MAKING: Evolving/moderate complexity   EVALUATION COMPLEXITY: Moderate       GOALS: Goals reviewed with patient?  yes   SHORT TERM GOALS:   Patient will be independent in self management strategies to improve quality of life and functional outcomes. Baseline: new program Target date: 10/26/2021 Goal status: INITIAL   2.  Patient will report at least 25% improvement in overall symptoms and/or function to demonstrate improved functional mobility Baseline: 0% Target date: 10/26/2021 Goal status: INITIAL   3.  Patient will be able to balance for at least 45 seconds in narrow BOS without UE support to demonstrate improved static balance. Baseline: see above Target date: 10/26/2021 Goal status: INITIAL           LONG TERM GOALS:   Patient will report at least 50% improvement in overall symptoms and/or function to demonstrate improved functional mobility Baseline: 0% Target date: 11/23/2021 Goal status: INITIAL   2.  Patient will be able to stand for at least 15 minutes to improve functional endurance with standing to prepare a meal Baseline: unable Target date: 11/23/2021 Goal status: INITIAL   3.  Patient will be able to balance in semi tandem for at least 15 seconds with either leg forward to demonstrate improved static balance Baseline: unable Target date: 11/23/2021 Goal status: INITIAL       PLAN: PT FREQUENCY: 2x/week   PT DURATION: 8 weeks   PLANNED INTERVENTIONS: Therapeutic exercises, Therapeutic activity, Neuromuscular re-education, Balance training, Gait training, Patient/Family education, Self Care, Joint mobilization, Joint manipulation, Stair  training, Orthotic/Fit training, Aquatic Therapy, Dry Needling, Electrical stimulation, Cryotherapy, Moist heat, Vasopneumatic device, Traction, Ionotophoresis /ml Dexamethasone, and Manual therapy.   PLAN FOR NEXT SESSION: DOES NOT TOLERATE SUPINE OR LAYING DOWN POSITIONS, Needs assist with bike, FU with making behavorial health referral - pt needs to call MD- redirect for any functional exercises or balance exercises.     Sedalia Muta, PT, DPT 2:31 PM  11/09/21   PHYSICAL THERAPY DISCHARGE SUMMARY  Visits from Start of Care: 10 Plan: Patient agrees to discharge.  Patient goals were partially met. Patient is being discharged due to meeting the stated rehab goals.  Pt did not return after visit 10, she was having a f/u with Ortho.    Sedalia Muta, PT, DPT 11:33 AM  03/02/22

## 2021-11-11 ENCOUNTER — Encounter: Payer: Self-pay | Admitting: Internal Medicine

## 2021-11-12 ENCOUNTER — Encounter: Payer: Self-pay | Admitting: Internal Medicine

## 2021-11-12 ENCOUNTER — Ambulatory Visit: Payer: Medicare Other | Admitting: Orthopedic Surgery

## 2021-11-12 ENCOUNTER — Telehealth (INDEPENDENT_AMBULATORY_CARE_PROVIDER_SITE_OTHER): Payer: Medicare Other | Admitting: Internal Medicine

## 2021-11-12 DIAGNOSIS — M48062 Spinal stenosis, lumbar region with neurogenic claudication: Secondary | ICD-10-CM

## 2021-11-12 DIAGNOSIS — F419 Anxiety disorder, unspecified: Secondary | ICD-10-CM | POA: Diagnosis not present

## 2021-11-12 DIAGNOSIS — S86012A Strain of left Achilles tendon, initial encounter: Secondary | ICD-10-CM

## 2021-11-12 MED ORDER — ALPRAZOLAM 0.5 MG PO TABS: 0.5000 mg | ORAL_TABLET | Freq: Two times a day (BID) | ORAL | 2 refills | Status: DC | PRN

## 2021-11-12 MED ORDER — HYDROCODONE-ACETAMINOPHEN 7.5-325 MG PO TABS: 1.0000 | ORAL_TABLET | Freq: Four times a day (QID) | ORAL | 0 refills | Status: DC | PRN

## 2021-11-12 NOTE — Progress Notes (Signed)
Virtual Visit via Video Note  I connected with Katie Booker on 11/12/21 at  4:00 PM EDT by a video enabled telemedicine application and verified that I am speaking with the correct person using two identifiers.  Location patient: home Location provider: work office Persons participating in the virtual visit: patient, provider  I discussed the limitations of evaluation and management by telemedicine and the availability of in person appointments. The patient expressed understanding and agreed to proceed.   HPI: This is a scheduled visit for the purpose of controlled substance refills per contract.  She is on hydrocodone 7.5 mg for her spinal stenosis with claudication as well as alprazolam that she uses for anxiety.  It is time for 30-month refills.  She was recently diagnosed with a left Achilles rupture by MRI and had her first visit with orthopedics today.   ROS: Constitutional: Denies fever, chills, diaphoresis, appetite change and fatigue.  HEENT: Denies photophobia, eye pain, redness, hearing loss, ear pain, congestion, sore throat, rhinorrhea, sneezing, mouth sores, trouble swallowing, neck pain, neck stiffness and tinnitus.   Respiratory: Denies SOB, DOE, cough, chest tightness,  and wheezing.   Cardiovascular: Denies chest pain, palpitations and leg swelling.  Gastrointestinal: Denies nausea, vomiting, abdominal pain, diarrhea, constipation, blood in stool and abdominal distention.  Genitourinary: Denies dysuria, urgency, frequency, hematuria, flank pain and difficulty urinating.  Endocrine: Denies: hot or cold intolerance, sweats, changes in hair or nails, polyuria, polydipsia. Musculoskeletal: Positive for myalgias, back pain, joint swelling, arthralgias and gait problem.  Skin: Denies pallor, rash and wound.  Neurological: Denies dizziness, seizures, syncope, weakness, light-headedness, numbness and headaches.  Hematological: Denies adenopathy. 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   Depression    DOE (dyspnea on exertion)    GERD    Headache    Hemorrhoids    History of basal cell carcinoma excision    left cheek   History of colon polyps    History of hyperthyroidism 2011   RAI treatement   History of panic attacks    Hyperlipidemia    Hypertension    Hypothyroidism, postradioiodine therapy    followed by pcp   IDA (iron deficiency anemia)    intermittant   Insomnia    OA (osteoarthritis)    OSA (obstructive sleep apnea)    no cpap, per pt tried unable to tolerate   Peripheral neuropathy    legs and feet from hx back surgery's   Rash    bilateral axilla area -- per pt due to some personal wipes used other the counter   RLS (restless legs syndrome)    Spondylolisthesis at L1-L2 level    Tingling of both upper extremities    left greater than right due to cervical pinched nerve   Type 2 diabetes mellitus (HCC)    followed by pcp   Umbilical hernia    Urinary frequency    Urinary urgency     Past Surgical History:  Procedure Laterality Date   ANTERIOR CERVICAL DECOMP/DISCECTOMY FUSION N/A  09/02/2014   Procedure: Cervical five- six  Anterior cervical decompression fusion;  Surgeon: Barnett AbuHenry Elsner, MD;  Location: [facility];  Service: Neurosurgery;  Laterality: N/A;  C5-6 Anterior cervical decompression/diskectomy/fusion   BREAST BIOPSY Right 10/2018   x 2 benign   CARPAL TUNNEL RELEASE Right 2016   CATARACT EXTRACTION W/ INTRAOCULAR LENS  IMPLANT, BILATERAL  03/2015   COLONOSCOPY  2007    ESOPHAGOGASTRODUODENOSCOPY  2007   HEMORRHOID SURGERY  03/23/2011   Procedure: HEMORRHOIDECTOMY;  Surgeon: Clovis Puhomas A. Cornett, MD;  Location: [facility];  Service: General;  Laterality: N/A;  lateral internal sphincterotomy and hemorrhoidectomy   LUMBAR FUSION  02/2015   L1 -- 2   PARTIAL KNEE ARTHROPLASTY Left 12/27/2017   Procedure: UNICOMPARTMENTAL LEFT KNEE;  Surgeon: Teryl LucyLandau, Joshua, MD;  Location: [facility];  Service: Orthopedics;  Laterality: Left;   THORACIC DISCECTOMY N/A 12/15/2012   Procedure: Thoracic ten-eleven Thoracic laminectomy;  Surgeon: Barnett AbuHenry Elsner, MD;  Location: [facility];  Service: Neurosurgery;  Laterality: N/A;  Thoracic ten-eleven Thoracic laminectomy   TUBAL LIGATION Bilateral 1991    Family History  Problem Relation Age of Onset   Diabetes Father    COPD Father    Cancer Maternal Grandfather        stomach    SOCIAL HX:   reports that she quit smoking about 17 years ago. Her smoking use included cigarettes. She has a 20.00 pack-year smoking history. She has never used smokeless tobacco. She reports current alcohol use. She reports that she does not use drugs.   Current Outpatient Medications:    atenolol (TENORMIN) 25 MG tablet, TAKE 1 TABLET BY MOUTH DAILY, Disp: 100 tablet, Rfl: 2   atorvastatin (LIPITOR) 40 MG tablet, TAKE 1 TABLET BY MOUTH DAILY, Disp: 100 tablet, Rfl: 2   calcium citrate (CALCITRATE - DOSED IN MG ELEMENTAL CALCIUM) 950 (200 Ca) MG tablet, Take 200 mg of elemental calcium by mouth daily., Disp: , Rfl:    Carboxymethylcell-Hypromellose (GENTEAL OP), Apply to eye., Disp: , Rfl:    Carboxymethylcellulose Sodium (LUBRICANT EYE DROPS OP), Apply 2 drops to eye 4 (four) times daily as needed (dry eyes). , Disp: , Rfl:    citalopram (CELEXA) 40 MG tablet, TAKE 1 TABLET BY MOUTH  DAILY, Disp: 90 tablet, Rfl: 1   estradiol (ESTRACE) 0.1 MG/GM vaginal cream, PLACE 1 APPLICATORFUL  VAGINALLY 3 TIMES A WEEK., Disp: 170 g, Rfl: 3    hydrocortisone 2.5 % cream, APPLY TO AFFECTED AREA(S)  TOPICALLY TWICE DAILY, Disp: 85.05 g, Rfl: 1   Lancets (ONETOUCH ULTRASOFT) lancets, Use daily, Disp: 100 each, Rfl: 6   levothyroxine (SYNTHROID) 125 MCG tablet, TAKE 1 TABLET BY MOUTH DAILY, Disp: 100 tablet, Rfl: 1   levothyroxine (SYNTHROID) 137 MCG tablet, TAKE 1 TABLET BY MOUTH  DAILY, Disp: 90 tablet, Rfl: 0   lisinopril (ZESTRIL) 10 MG tablet, TAKE 1 TABLET BY MOUTH  DAILY, Disp: 90 tablet, Rfl: 3   meloxicam (MOBIC) 15 MG tablet, TAKE 1 TABLET BY MOUTH DAILY, Disp: 100 tablet, Rfl: 0   metFORMIN (GLUCOPHAGE) 1000 MG tablet, TAKE 1 TABLET BY MOUTH  TWICE DAILY WITH MEALS, Disp: 200 tablet, Rfl: 2   Misc Natural Products (TURMERIC CURCUMIN) CAPS, Take by mouth., Disp: , Rfl:    Misc. Devices (BARIATRIC ROLLATOR) MISC, 1 Rollator with basket., Disp: 1 each, Rfl: 0   Multiple Vitamin (MULTIVITAMIN) tablet, Take 1 tablet by mouth daily., Disp: , Rfl:    Omega-3 Fatty Acids (FISH  OIL) 1200 MG CAPS, Take 1 capsule by mouth daily. , Disp: , Rfl:    ONE TOUCH ULTRA TEST test strip, USE 1 DAILY AS DIRECTED, Disp: 100 each, Rfl: 3   pantoprazole (PROTONIX) 40 MG tablet, TAKE 1 TABLET BY MOUTH TWICE  DAILY BEFORE MEALS, Disp: 200 tablet, Rfl: 1   pioglitazone (ACTOS) 15 MG tablet, TAKE 1 TABLET BY MOUTH DAILY, Disp: 100 tablet, Rfl: 1   pregabalin (LYRICA) 75 MG capsule, TAKE 1 CAPSULE BY MOUTH DAILY, Disp: 90 capsule, Rfl: 1   rOPINIRole (REQUIP) 1 MG tablet, TAKE 1 TABLET BY MOUTH AT  BEDTIME, Disp: 100 tablet, Rfl: 2   tiZANidine (ZANAFLEX) 4 MG tablet, TAKE 1 TABLET BY MOUTH 3 TIMES  DAILY AS NEEDED, Disp: 180 tablet, Rfl: 1   tobramycin (TOBREX) 0.3 % ophthalmic solution, Place 1 drop into both eyes every 6 (six) hours. Day before injection, day of injection and day after injection, Disp: , Rfl:    traZODone (DESYREL) 100 MG tablet, TAKE 1 TO 2 TABLETS BY  MOUTH AT BEDTIME (Patient taking differently: Take one and half tablets at bedtime),  Disp: 180 tablet, Rfl: 3   Vitamin D, Ergocalciferol, (DRISDOL) 1.25 MG (50000 UNIT) CAPS capsule, TAKE 1 CAPSULE BY MOUTH  EVERY 7 DAYS FOR 12 DOSES, Disp: 12 capsule, Rfl: 3   ALPRAZolam (XANAX) 0.5 MG tablet, Take 1 tablet (0.5 mg total) by mouth 2 (two) times daily as needed. for anxiety, Disp: 60 tablet, Rfl: 2   HYDROcodone-acetaminophen (NORCO) 7.5-325 MG tablet, Take 1 tablet by mouth every 6 (six) hours as needed for moderate pain., Disp: 120 tablet, Rfl: 0   HYDROcodone-acetaminophen (NORCO) 7.5-325 MG tablet, Take 1 tablet by mouth every 6 (six) hours as needed for moderate pain., Disp: 120 tablet, Rfl: 0   HYDROcodone-acetaminophen (NORCO) 7.5-325 MG tablet, Take 1 tablet by mouth every 6 (six) hours as needed for moderate pain., Disp: 120 tablet, Rfl: 0  EXAM:   VITALS per patient if applicable: None reported  GENERAL: alert, oriented, appears well and in no acute distress  HEENT: atraumatic, conjunttiva clear, no obvious abnormalities on inspection of external nose and ears  NECK: normal movements of the head and neck  LUNGS: on inspection no signs of respiratory distress, breathing rate appears normal, no obvious gross increased work of breathing, gasping or wheezing  CV: no obvious cyanosis  MS: moves all visible extremities without noticeable abnormality  PSYCH/NEURO: pleasant and cooperative, no obvious depression or anxiety, speech and thought processing grossly intact  ASSESSMENT AND PLAN:   Spinal stenosis, lumbar region, with neurogenic claudication - Plan: HYDROcodone-acetaminophen (NORCO) 7.5-325 MG tablet, HYDROcodone-acetaminophen (NORCO) 7.5-325 MG tablet, HYDROcodone-acetaminophen (NORCO) 7.5-325 MG tablet  Anxiety - Plan: ALPRAZolam (XANAX) 0.5 MG tablet  -PDMP reviewed, no red flags, overdose risk score is 240. -I will refill Norco 7.5 mg to take 1 tablet every 6 hours as needed for pain for total of 120 tablets a month x3 months. -I will also refill  alprazolam 0.5 mg that she can take twice daily as needed for anxiety for total of 60 tablets a month x2 months. -She has scheduled an appointment with a new therapist as I had recommended to her in the past. -She had her first visit with orthopedics today after being diagnosed with a ruptured left Achilles tendon.  They are trying to treat her nonoperatively given her age and comorbidities.    I discussed the assessment and treatment plan with the patient. The patient was  provided an opportunity to ask questions and all were answered. The patient agreed with the plan and demonstrated an understanding of the instructions.   The patient was advised to call back or seek an in-person evaluation if the symptoms worsen or if the condition fails to improve as anticipated.    Lelon Frohlich, MD  [facility]

## 2021-11-17 ENCOUNTER — Encounter: Payer: Medicare Other | Admitting: Physical Therapy

## 2021-11-19 ENCOUNTER — Encounter: Payer: Medicare Other | Admitting: Physical Therapy

## 2021-11-22 ENCOUNTER — Encounter: Payer: Self-pay | Admitting: Orthopedic Surgery

## 2021-11-22 NOTE — Progress Notes (Addendum)
Office Visit Note   Patient: Katie Booker           Date of Birth: February 21, 1946           MRN: [id number] Visit Date: 11/12/2021              Requested by: Rodolph Bong, MD [address] [place],  [place] [address] PCP: Philip Aspen, Limmie Patricia, MD  Chief Complaint  Patient presents with   Left Ankle - Pain      HPI: Patient is a 76 year old woman who is seen for initial evaluation for left Achilles tendon rupture.  Patient states the injury occurred approximately 2 months ago.  Patient has had an MRI scan that was obtained on 11/02/2021.  Patient states that she was lifting her foot off the bed and developed acute bruising and then when she was in the shower she hit her heel in the shower and fell.  Patient states that she has been in therapy.  She states she has a short fracture boot that she does not use.  Patient states that therapy agreed that she does not need to wear it.  Past medical history includes diabetes hypertension and sleep apnea.  Assessment & Plan: Visit Diagnoses:  1. Achilles rupture, left, initial encounter     Plan: We will place her in a fracture boot with 2 heel lifts and reevaluate in 4 weeks.  Discussed with her retraction and other medical conditions that reconstruction would have increased risks.  Will reevaluate in 4 weeks.  Follow-Up Instructions: Return in about 4 weeks (around 12/10/2021).   Ortho Exam  Patient is alert, oriented, no adenopathy, well-dressed, normal affect, normal respiratory effort. Examination patient has a palpable dorsalis pedis pulse and a palpable defect.  Compression of the calf does not reproduce plantarflexion of the foot.  She has no venous stasis ulcers.  Her last hemoglobin A1c was 5.9.  Review of the MRI scan shows tendon retraction of 4.6 cm.  Imaging: No results found. No images are attached to the encounter.  Labs: Lab Results  Component Value Date   HGBA1C 5.9 (A) 09/10/2021   HGBA1C 6.1  02/05/2021   HGBA1C 5.7 (A) 11/06/2020   ESRSEDRATE 21 08/04/2012   LABURIC 5.0 12/25/2014   LABORGA PROTEUS MIRABILIS 04/27/2013     Lab Results  Component Value Date   ALBUMIN 4.2 02/05/2021   ALBUMIN 4.3 08/15/2018   ALBUMIN 4.2 04/19/2016    No results found for: "MG" Lab Results  Component Value Date   VD25OH 46.32 02/05/2021   VD25OH 39 12/05/2019   VD25OH 63.02 08/15/2018    No results found for: "PREALBUMIN"    Latest Ref Rng & Units 02/05/2021    9:59 AM 12/05/2019    9:41 AM 08/15/2018    9:25 AM  CBC EXTENDED  WBC 4.0 - 10.5 K/uL 4.5  7.4  10.5   RBC 3.87 - 5.11 Mil/uL 4.10  4.19  4.33   Hemoglobin 12.0 - 15.0 g/dL 62.8  31.5  17.6   HCT 36.0 - 46.0 % 35.6  35.8  36.8   Platelets 150.0 - 400.0 K/uL 175.0  189  202.0   NEUT# 1.4 - 7.7 K/uL 2.3  4,196  7.1   Lymph# 0.7 - 4.0 K/uL 1.6  2,272  2.1      There is no height or weight on file to calculate BMI.  Orders:  No orders of the defined types were placed in this encounter.  No orders of the defined types were placed in this encounter.    Procedures: No procedures performed  Clinical Data: No additional findings.  ROS:  All other systems negative, except as noted in the HPI. Review of Systems  Objective: Vital Signs: There were no vitals taken for this visit.  Specialty Comments:  No specialty comments available.  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 12/15/2012   Spinal stenosis, lumbar region, with neurogenic claudication 10/26/2012   Spinal stenosis, thoracic 03/02/3233    Umbilical hernia 57/32/2025   OSA (obstructive sleep apnea) 11/12/2011   Exogenous obesity 11/12/2011   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   Hypothyroidism 09/18/2009   Diabetes mellitus without complication ([place]) 42/70/6237   Dyslipidemia 09/18/2009   Anemia 09/18/2009   Depression 09/18/2009   Essential hypertension 09/18/2009   GERD 09/18/2009   Primary localized osteoarthritis of left knee 09/18/2009   Past Medical History:  Diagnosis Date   Age-related macular degeneration, wet, right eye ([place])    per pt has had treatment in past   Anxiety    Chronic constipation    Chronic low back pain    Chronic neck pain    Complication of anesthesia    10/ 2014 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   Depression    DOE (dyspnea on exertion)    GERD    Headache    Hemorrhoids    History of basal cell carcinoma excision    left cheek   History of colon polyps    History of hyperthyroidism 2011   RAI treatement   History of panic attacks    Hyperlipidemia    Hypertension    Hypothyroidism, postradioiodine therapy    followed by pcp   IDA (iron deficiency anemia)    intermittant   Insomnia    OA (osteoarthritis)    OSA (obstructive sleep apnea)    no cpap, per pt tried unable to tolerate   Peripheral neuropathy    legs and feet from hx back surgery's   Rash    bilateral axilla area -- per pt due to some personal wipes used other the counter   RLS (restless legs syndrome)    Spondylolisthesis at L1-L2 level    Tingling of both upper extremities    left greater than right due to cervical pinched nerve   Type 2 diabetes mellitus ([place])    followed by pcp   Umbilical hernia    Urinary frequency    Urinary urgency     Family History  Problem Relation Age of Onset   Diabetes Father    COPD Father    Cancer Maternal Grandfather        stomach    Past Surgical History:  Procedure Laterality Date   ANTERIOR CERVICAL DECOMP/DISCECTOMY FUSION N/A 09/02/2014    Procedure: Cervical five- six  Anterior cervical decompression fusion;  Surgeon: Kristeen Miss, MD;  Location: [facility];  Service: Neurosurgery;  Laterality: N/A;  C5-6 Anterior cervical decompression/diskectomy/fusion   BREAST BIOPSY Right 10/2018   x 2 benign   CARPAL TUNNEL RELEASE Right 2016  CATARACT EXTRACTION W/ INTRAOCULAR LENS  IMPLANT, BILATERAL  03/2015   COLONOSCOPY  2007   ESOPHAGOGASTRODUODENOSCOPY  2007   HEMORRHOID SURGERY  03/23/2011   Procedure: HEMORRHOIDECTOMY;  Surgeon: Clovis Pu. Cornett, MD;  Location: [facility];  Service: General;  Laterality: N/A;  lateral internal sphincterotomy and hemorrhoidectomy   LUMBAR FUSION  02/2015   L1 -- 2   PARTIAL KNEE ARTHROPLASTY Left 12/27/2017   Procedure: UNICOMPARTMENTAL LEFT KNEE;  Surgeon: Teryl Lucy, MD;  Location: [facility];  Service: Orthopedics;  Laterality: Left;   THORACIC DISCECTOMY N/A 12/15/2012   Procedure: Thoracic ten-eleven Thoracic laminectomy;  Surgeon: Barnett Abu, MD;  Location: [facility];  Service: Neurosurgery;  Laterality: N/A;  Thoracic ten-eleven Thoracic laminectomy   TUBAL LIGATION Bilateral 1991   Social History   Occupational History   Occupation: retired  Tobacco Use   Smoking status: Former    Packs/day: 1.00    Years: 20.00    Total pack years: 20.00    Types: Cigarettes    Quit date: 02/23/2004    Years since quitting: 17.7   Smokeless tobacco: Never   Tobacco comments:    quit 2007  Substance and Sexual Activity   Alcohol use: Yes    Alcohol/week: 0.0 standard drinks of alcohol    Comment: rare   Drug use: No   Sexual activity: Never    Birth control/protection: Post-menopausal

## 2021-11-24 ENCOUNTER — Encounter: Payer: Medicare Other | Admitting: Gastroenterology

## 2021-11-24 ENCOUNTER — Encounter: Payer: Medicare Other | Admitting: Physical Therapy

## 2021-11-24 ENCOUNTER — Other Ambulatory Visit: Payer: Self-pay | Admitting: Internal Medicine

## 2021-11-24 DIAGNOSIS — E039 Hypothyroidism, unspecified: Secondary | ICD-10-CM

## 2021-11-26 ENCOUNTER — Encounter: Payer: Medicare Other | Admitting: Physical Therapy

## 2021-12-03 NOTE — Telephone Encounter (Signed)
Called patient to follow up she is not ready at this time but will call back.

## 2021-12-19 ENCOUNTER — Other Ambulatory Visit: Payer: Self-pay | Admitting: Internal Medicine

## 2021-12-19 DIAGNOSIS — G4700 Insomnia, unspecified: Secondary | ICD-10-CM

## 2021-12-21 ENCOUNTER — Ambulatory Visit: Payer: Medicare Other | Admitting: Orthopedic Surgery

## 2021-12-21 DIAGNOSIS — S86012A Strain of left Achilles tendon, initial encounter: Secondary | ICD-10-CM | POA: Diagnosis not present

## 2021-12-31 ENCOUNTER — Encounter: Payer: Self-pay | Admitting: Internal Medicine

## 2021-12-31 DIAGNOSIS — G8929 Other chronic pain: Secondary | ICD-10-CM

## 2022-01-04 MED ORDER — PREGABALIN 75 MG PO CAPS: 75.0000 mg | ORAL_CAPSULE | Freq: Two times a day (BID) | ORAL | 1 refills | Status: DC

## 2022-01-05 ENCOUNTER — Encounter: Payer: Self-pay | Admitting: Orthopedic Surgery

## 2022-01-05 NOTE — Progress Notes (Signed)
Office Visit Note   Patient: Katie Booker           Date of Birth: 06-Nov-1945           MRN: [id number] Visit Date: 12/21/2021              Requested by: Philip Aspen, Limmie Patricia, MD [address] [place],  [place] [address] PCP: Philip Aspen, Limmie Patricia, MD  Chief Complaint  Patient presents with   Left Achilles Tendon - Follow-up    3 months out from rupture.       HPI: Patient is a 76 year old woman follow-up for left Achilles tendon rupture about 3 months ago.  Patient is currently ambulating in a sneaker with heel lifts.  She states the fracture boot was too heavy.  She states she has minimal pain currently ambulates with a rolling walker.  Assessment & Plan: Visit Diagnoses:  1. Achilles rupture, left, initial encounter     Plan: Patient will continue increase her activities as tolerated recommended trying coconut water for the muscle cramping she was already on Lyrica.  Recommend that she could resume the Lyrica twice a day.  Follow-Up Instructions: Return in about 4 weeks (around 01/18/2022).   Ortho Exam  Patient is alert, oriented, no adenopathy, well-dressed, normal affect, normal respiratory effort. Examination patient has plantarflexion of the left foot with compression of the calf.  The Achilles tendon is healing.  Examination her forefoot she has an avulsion of the second toenail but no signs of infection.  Imaging: No results found. No images are attached to the encounter.  Labs: Lab Results  Component Value Date   HGBA1C 5.9 (A) 09/10/2021   HGBA1C 6.1 02/05/2021   HGBA1C 5.7 (A) 11/06/2020   ESRSEDRATE 21 08/04/2012   LABURIC 5.0 12/25/2014   LABORGA PROTEUS MIRABILIS 04/27/2013     Lab Results  Component Value Date   ALBUMIN 4.2 02/05/2021   ALBUMIN 4.3 08/15/2018   ALBUMIN 4.2 04/19/2016    No results found for: "MG" Lab Results  Component Value Date   VD25OH 46.32 02/05/2021   VD25OH 39 12/05/2019   VD25OH 63.02  08/15/2018    No results found for: "PREALBUMIN"    Latest Ref Rng & Units 02/05/2021    9:59 AM 12/05/2019    9:41 AM 08/15/2018    9:25 AM  CBC EXTENDED  WBC 4.0 - 10.5 K/uL 4.5  7.4  10.5   RBC 3.87 - 5.11 Mil/uL 4.10  4.19  4.33   Hemoglobin 12.0 - 15.0 g/dL 27.0  62.3  76.2   HCT 36.0 - 46.0 % 35.6  35.8  36.8   Platelets 150.0 - 400.0 K/uL 175.0  189  202.0   NEUT# 1.4 - 7.7 K/uL 2.3  4,196  7.1   Lymph# 0.7 - 4.0 K/uL 1.6  2,272  2.1      There is no height or weight on file to calculate BMI.  Orders:  No orders of the defined types were placed in this encounter.  No orders of the defined types were placed in this encounter.    Procedures: No procedures performed  Clinical Data: No additional findings.  ROS:  All other systems negative, except as noted in the HPI. Review of Systems  Objective: Vital Signs: There were no vitals taken for this visit.  Specialty Comments:  No specialty comments available.  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, L2-5 12/15/2012   Spinal stenosis, lumbar region, with neurogenic claudication 10/26/2012   Spinal stenosis, thoracic 10/26/2012   Umbilical hernia 02/25/2012   OSA (obstructive sleep apnea) 11/12/2011   Exogenous obesity 11/12/2011   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   Hypothyroidism 09/18/2009   Diabetes mellitus without complication (HCC) 09/18/2009   Dyslipidemia 09/18/2009   Anemia 09/18/2009   Depression 09/18/2009   Essential hypertension 09/18/2009   GERD 09/18/2009   Primary localized  osteoarthritis of left knee 09/18/2009   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   Depression    DOE (dyspnea on exertion)    GERD    Headache    Hemorrhoids    History of basal cell carcinoma excision    left cheek   History of colon polyps    History of hyperthyroidism 2011   RAI treatement   History of panic attacks    Hyperlipidemia    Hypertension    Hypothyroidism, postradioiodine therapy    followed by pcp   IDA (iron deficiency anemia)    intermittant   Insomnia    OA (osteoarthritis)    OSA (obstructive sleep apnea)    no cpap, per pt tried unable to tolerate   Peripheral neuropathy    legs and feet from hx back surgery's   Rash    bilateral axilla area -- per pt due to some personal wipes used other the counter   RLS (restless legs syndrome)    Spondylolisthesis at L1-L2 level    Tingling of both upper extremities    left greater than right due to cervical pinched nerve   Type 2 diabetes mellitus (HCC)    followed by pcp   Umbilical hernia    Urinary frequency    Urinary urgency     Family History  Problem Relation Age of Onset   Diabetes Father    COPD Father    Cancer Maternal Grandfather        stomach    Past Surgical History:  Procedure Laterality Date   ANTERIOR CERVICAL DECOMP/DISCECTOMY FUSION N/A 09/02/2014   Procedure: Cervical five- six  Anterior cervical decompression fusion;  Surgeon: Barnett Abu, MD;  Location: [facility];  Service: Neurosurgery;  Laterality: N/A;  C5-6 Anterior cervical decompression/diskectomy/fusion   BREAST BIOPSY Right 10/2018   x 2 benign   CARPAL TUNNEL RELEASE Right 2016   CATARACT EXTRACTION W/ INTRAOCULAR LENS  IMPLANT, BILATERAL  03/2015    COLONOSCOPY  2007   ESOPHAGOGASTRODUODENOSCOPY  2007   HEMORRHOID SURGERY  03/23/2011   Procedure: HEMORRHOIDECTOMY;  Surgeon: Clovis Pu. Cornett, MD;  Location: [facility];  Service: General;  Laterality: N/A;  lateral internal sphincterotomy and hemorrhoidectomy   LUMBAR FUSION  02/2015   L1 -- 2   PARTIAL KNEE ARTHROPLASTY Left 12/27/2017   Procedure: UNICOMPARTMENTAL LEFT KNEE;  Surgeon: Teryl Lucy, MD;  Location: [facility];  Service: Orthopedics;  Laterality: Left;   THORACIC DISCECTOMY N/A 12/15/2012   Procedure: Thoracic ten-eleven Thoracic laminectomy;  Surgeon: Barnett Abu, MD;  Location: [facility];  Service: Neurosurgery;  Laterality: N/A;  Thoracic ten-eleven Thoracic laminectomy   TUBAL LIGATION Bilateral 1991   Social History   Occupational History   Occupation: retired  Tobacco Use   Smoking status: Former    Packs/day: 1.00    Years: 20.00    Total pack years: 20.00    Types: Cigarettes    Quit date: 02/23/2004    Years since quitting: 17.8   Smokeless tobacco: Never   Tobacco comments:    quit 2007  Substance and Sexual Activity   Alcohol use: Yes    Alcohol/week: 0.0 standard drinks of alcohol    Comment: rare   Drug use: No   Sexual activity: Never    Birth control/protection: Post-menopausal

## 2022-01-12 ENCOUNTER — Encounter: Payer: Self-pay | Admitting: Internal Medicine

## 2022-01-12 ENCOUNTER — Telehealth: Payer: Self-pay | Admitting: Internal Medicine

## 2022-01-12 NOTE — Telephone Encounter (Signed)
Pt states she stopped taking the rOPINIRole (REQUIP) 1 MG tablet 6 weeks ago, because as Pt states, condition worsened when she started taking it. Pt would like to try an alternative to this medication.  Pt is also requesting a Rx for pain medication and Xanax.  LOV:  09/10/21  Please advise.  [id number] [place], [place] - [address] AT [facility] [address]

## 2022-01-12 NOTE — Telephone Encounter (Signed)
error 

## 2022-01-13 ENCOUNTER — Telehealth (INDEPENDENT_AMBULATORY_CARE_PROVIDER_SITE_OTHER): Payer: Medicare Other | Admitting: Internal Medicine

## 2022-01-13 ENCOUNTER — Encounter: Payer: Self-pay | Admitting: Internal Medicine

## 2022-01-13 DIAGNOSIS — G2581 Restless legs syndrome: Secondary | ICD-10-CM | POA: Diagnosis not present

## 2022-01-13 NOTE — Progress Notes (Signed)
Virtual Visit via Video Note  I connected with Katie Booker on 01/13/22 at  4:00 PM EST by a video enabled telemedicine application and verified that I am speaking with the correct person using two identifiers.  Location patient: home Location provider: work office Persons participating in the virtual visit: patient, provider  I discussed the limitations of evaluation and management by telemedicine and the availability of in person appointments. The patient expressed understanding and agreed to proceed.   HPI: She has scheduled this visit to discuss her restless legs.  She has been on Requip 1 mg long-term for her restless leg syndrome.  She feels like about 6 weeks ago she was having a "opposite effect" with Requip while she would have more restless legs.  She stopped taking the ropinirole and felt improved for about a couple weeks.  But then the restless leg started bothering her again.  She increased her Lyrica to taking 2 tablets at bedtime and is improved.   ROS: Constitutional: Denies fever, chills, diaphoresis, appetite change and fatigue.  HEENT: Denies photophobia, eye pain, redness, hearing loss, ear pain, congestion, sore throat, rhinorrhea, sneezing, mouth sores, trouble swallowing, neck pain, neck stiffness and tinnitus.   Respiratory: Denies SOB, DOE, cough, chest tightness,  and wheezing.   Cardiovascular: Denies chest pain, palpitations and leg swelling.  Gastrointestinal: Denies nausea, vomiting, abdominal pain, diarrhea, constipation, blood in stool and abdominal distention.  Genitourinary: Denies dysuria, urgency, frequency, hematuria, flank pain and difficulty urinating.  Endocrine: Denies: hot or cold intolerance, sweats, changes in hair or nails, polyuria, polydipsia. Musculoskeletal: Denies myalgias, back pain, joint swelling, arthralgias and gait problem.  Skin: Denies pallor, rash and wound.  Neurological: Denies dizziness, seizures, syncope, weakness,  light-headedness, numbness and headaches.  Hematological: Denies adenopathy. 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   Depression    DOE (dyspnea on exertion)    GERD    Headache    Hemorrhoids    History of basal cell carcinoma excision    left cheek   History of colon polyps    History of hyperthyroidism 2011   RAI treatement   History of panic attacks    Hyperlipidemia    Hypertension    Hypothyroidism, postradioiodine therapy    followed by pcp   IDA (iron deficiency anemia)    intermittant   Insomnia    OA (osteoarthritis)    OSA (obstructive sleep apnea)    no cpap, per pt tried unable to tolerate   Peripheral neuropathy    legs and feet from hx back surgery's   Rash    bilateral axilla area -- per pt due to some personal wipes used other the counter   RLS (restless legs syndrome)    Spondylolisthesis at L1-L2 level    Tingling of both upper extremities    left greater than right due to cervical pinched nerve   Type 2 diabetes mellitus (HCC)    followed by pcp   Umbilical hernia    Urinary frequency  Urinary urgency     Past Surgical History:  Procedure Laterality Date   ANTERIOR CERVICAL DECOMP/DISCECTOMY FUSION N/A 09/02/2014   Procedure: Cervical five- six  Anterior cervical decompression fusion;  Surgeon: Barnett Abu, MD;  Location: [facility];  Service: Neurosurgery;  Laterality: N/A;  C5-6 Anterior cervical decompression/diskectomy/fusion   BREAST BIOPSY Right 10/2018   x 2 benign   CARPAL TUNNEL RELEASE Right 2016    CATARACT EXTRACTION W/ INTRAOCULAR LENS  IMPLANT, BILATERAL  03/2015   COLONOSCOPY  2007   ESOPHAGOGASTRODUODENOSCOPY  2007   HEMORRHOID SURGERY  03/23/2011   Procedure: HEMORRHOIDECTOMY;  Surgeon: Clovis Pu. Cornett, MD;  Location: [facility];  Service: General;  Laterality: N/A;  lateral internal sphincterotomy and hemorrhoidectomy   LUMBAR FUSION  02/2015   L1 -- 2   PARTIAL KNEE ARTHROPLASTY Left 12/27/2017   Procedure: UNICOMPARTMENTAL LEFT KNEE;  Surgeon: Teryl Lucy, MD;  Location: [facility];  Service: Orthopedics;  Laterality: Left;   THORACIC DISCECTOMY N/A 12/15/2012   Procedure: Thoracic ten-eleven Thoracic laminectomy;  Surgeon: Barnett Abu, MD;  Location: [facility];  Service: Neurosurgery;  Laterality: N/A;  Thoracic ten-eleven Thoracic laminectomy   TUBAL LIGATION Bilateral 1991    Family History  Problem Relation Age of Onset   Diabetes Father    COPD Father    Cancer Maternal Grandfather        stomach    SOCIAL HX:   reports that she quit smoking about 17 years ago. Her smoking use included cigarettes. She has a 20.00 pack-year smoking history. She has never used smokeless tobacco. She reports current alcohol use. She reports that she does not use drugs.   Current Outpatient Medications:    ALPRAZolam (XANAX) 0.5 MG tablet, Take 1 tablet (0.5 mg total) by mouth 2 (two) times daily as needed. for anxiety, Disp: 60 tablet, Rfl: 2   atenolol (TENORMIN) 25 MG tablet, TAKE 1 TABLET BY MOUTH DAILY, Disp: 100 tablet, Rfl: 2   atorvastatin (LIPITOR) 40 MG tablet, TAKE 1 TABLET BY MOUTH DAILY, Disp: 100 tablet, Rfl: 2   calcium citrate (CALCITRATE - DOSED IN MG ELEMENTAL CALCIUM) 950 (200 Ca) MG tablet, Take 200 mg of elemental calcium by mouth daily., Disp: , Rfl:    Carboxymethylcell-Hypromellose (GENTEAL OP), Apply to eye., Disp: , Rfl:    Carboxymethylcellulose Sodium (LUBRICANT EYE DROPS OP), Apply 2 drops to eye 4 (four) times daily as needed (dry eyes).  , Disp: , Rfl:    citalopram (CELEXA) 40 MG tablet, TAKE 1 TABLET BY MOUTH  DAILY, Disp: 90 tablet, Rfl: 1   estradiol (ESTRACE) 0.1 MG/GM vaginal cream, PLACE 1 APPLICATORFUL  VAGINALLY 3 TIMES A WEEK., Disp: 170 g, Rfl: 3   HYDROcodone-acetaminophen (NORCO) 7.5-325 MG tablet, Take 1 tablet by mouth every 6 (six) hours as needed for moderate pain., Disp: 120 tablet, Rfl: 0   HYDROcodone-acetaminophen (NORCO) 7.5-325 MG tablet, Take 1 tablet by mouth every 6 (six) hours as needed for moderate pain., Disp: 120 tablet, Rfl: 0   HYDROcodone-acetaminophen (NORCO) 7.5-325 MG tablet, Take 1 tablet by mouth every 6 (six) hours as needed for moderate pain., Disp: 120 tablet, Rfl: 0   hydrocortisone 2.5 % cream, APPLY TO AFFECTED AREA(S)  TOPICALLY TWICE DAILY, Disp: 85.05 g, Rfl: 1   Lancets (ONETOUCH ULTRASOFT) lancets, Use daily, Disp: 100 each, Rfl: 6   levothyroxine (SYNTHROID) 125 MCG tablet, TAKE 1 TABLET BY MOUTH DAILY, Disp: 100 tablet, Rfl: 1   levothyroxine (SYNTHROID) 137  MCG tablet, TAKE 1 TABLET BY MOUTH DAILY, Disp: 100 tablet, Rfl: 0   lisinopril (ZESTRIL) 10 MG tablet, TAKE 1 TABLET BY MOUTH  DAILY, Disp: 90 tablet, Rfl: 3   meloxicam (MOBIC) 15 MG tablet, TAKE 1 TABLET BY MOUTH DAILY, Disp: 100 tablet, Rfl: 0   metFORMIN (GLUCOPHAGE) 1000 MG tablet, TAKE 1 TABLET BY MOUTH  TWICE DAILY WITH MEALS, Disp: 200 tablet, Rfl: 2   Misc Natural Products (TURMERIC CURCUMIN) CAPS, Take by mouth., Disp: , Rfl:    Misc. Devices (BARIATRIC ROLLATOR) MISC, 1 Rollator with basket., Disp: 1 each, Rfl: 0   Multiple Vitamin (MULTIVITAMIN) tablet, Take 1 tablet by mouth daily., Disp: , Rfl:    Omega-3 Fatty Acids (FISH OIL) 1200 MG CAPS, Take 1 capsule by mouth daily. , Disp: , Rfl:    ONE TOUCH ULTRA TEST test strip, USE 1 DAILY AS DIRECTED, Disp: 100 each, Rfl: 3   pantoprazole (PROTONIX) 40 MG tablet, TAKE 1 TABLET BY MOUTH TWICE  DAILY BEFORE MEALS, Disp: 200 tablet, Rfl: 1   pioglitazone (ACTOS) 15 MG  tablet, TAKE 1 TABLET BY MOUTH DAILY, Disp: 100 tablet, Rfl: 1   pregabalin (LYRICA) 75 MG capsule, Take 1 capsule (75 mg total) by mouth 2 (two) times daily., Disp: 180 capsule, Rfl: 1   tiZANidine (ZANAFLEX) 4 MG tablet, TAKE 1 TABLET BY MOUTH 3 TIMES  DAILY AS NEEDED, Disp: 180 tablet, Rfl: 1   tobramycin (TOBREX) 0.3 % ophthalmic solution, Place 1 drop into both eyes every 6 (six) hours. Day before injection, day of injection and day after injection, Disp: , Rfl:    traZODone (DESYREL) 100 MG tablet, TAKE 1 TO 2 TABLETS BY MOUTH AT  BEDTIME, Disp: 180 tablet, Rfl: 3   Vitamin D, Ergocalciferol, (DRISDOL) 1.25 MG (50000 UNIT) CAPS capsule, TAKE 1 CAPSULE BY MOUTH  EVERY 7 DAYS FOR 12 DOSES, Disp: 12 capsule, Rfl: 3  EXAM:   VITALS per patient if applicable: None reported  GENERAL: alert, oriented, appears well and in no acute distress  HEENT: atraumatic, conjunttiva clear, no obvious abnormalities on inspection of external nose and ears  NECK: normal movements of the head and neck  LUNGS: on inspection no signs of respiratory distress, breathing rate appears normal, no obvious gross increased work of breathing, gasping or wheezing  CV: no obvious cyanosis  MS: moves all visible extremities without noticeable abnormality  PSYCH/NEURO: pleasant and cooperative, no obvious depression or anxiety, speech and thought processing grossly intact  ASSESSMENT AND PLAN:   Restless leg syndrome -Okay to discontinue Requip and continue taking Lyrica 2 tablets at bedtime as she is already doing.   I discussed the assessment and treatment plan with the patient. The patient was provided an opportunity to ask questions and all were answered. The patient agreed with the plan and demonstrated an understanding of the instructions.   The patient was advised to call back or seek an in-person evaluation if the symptoms worsen or if the condition fails to improve as anticipated.    Chaya Jan, MD  [facility]

## 2022-01-19 ENCOUNTER — Other Ambulatory Visit: Payer: Self-pay | Admitting: Internal Medicine

## 2022-01-20 ENCOUNTER — Other Ambulatory Visit: Payer: Self-pay | Admitting: Internal Medicine

## 2022-01-28 ENCOUNTER — Other Ambulatory Visit: Payer: Self-pay | Admitting: Internal Medicine

## 2022-02-02 ENCOUNTER — Encounter: Payer: Self-pay | Admitting: Internal Medicine

## 2022-02-02 ENCOUNTER — Telehealth: Payer: Self-pay | Admitting: Pharmacist

## 2022-02-02 NOTE — Progress Notes (Signed)
Chronic Care Management Pharmacy Assistant   Name: Katie Booker  MRN: [id number] DOB: [birth year]  Reason for Encounter: Disease State/ General Assessment   Recent office visits:  01/13/22 Katie Booker, Katie Patricia, MD  - Patient presented via video for Restless leg syndrome. No medication changes.   11/12/21 Katie Booker, Katie Patricia, MD  - Patient presented via video for Spinal stenosis of lumbar region with neurogenic claudication and other concerns. No medication changes.   09/10/21 Katie Booker, Katie Patricia, MD  - Patient presented for Type 2 diabetes mellitus with other specified complication without long term current use of insulin and other concerns. No medication changes.   08/21/21 Katie Booker, Katie Booker - Claims encounter for Essential hypertension. No other visit details available.   08/18/21 Katie Booker, Katie Booker - Patient presented via video for Spinal stenosis thoracic and other concerns. No medication changes.   Recent consult visits:  12/21/21 Katie Mustard, MD (Orthopedic Surg) - Patient presented for Achilles rupture left. No medication changes.   11/12/21  Katie Mustard, MD (Orthopedic Surg) - Patient presented for Achilles rupture left. No medication changes.   11/09/21 Katie Booker, PT  - Patient presented for difficulty in walking not elsewhere classified and other concerns. No medication changes.   10/19/21 - Patient presented to [facility] on 10/19/21 for LE Venous DVT. No medication changes.   10/19/21 Katie Bong, MD  - Patient presented for Pain of left heel and other concerns. No medication changes.   09/16/21 Katie Booker - Claims encounter for Low back pain. No other visit details available.  09/14/21 Katie Bong, MD  - Patient presented for Pain of left heel and other concerns. 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 No other visit details available.   Hospital visits:  None in previous 6 months  Medications: Outpatient Encounter Medications as of 02/02/2022  Medication Sig   ALPRAZolam (XANAX) 0.5 MG tablet Take 1 tablet (0.5 mg total) by mouth 2 (two) times daily as needed. for anxiety   atenolol (TENORMIN) 25 MG tablet TAKE 1 TABLET BY MOUTH DAILY   atorvastatin (LIPITOR) 40 MG tablet TAKE 1 TABLET BY MOUTH DAILY   calcium citrate (CALCITRATE - DOSED IN MG ELEMENTAL CALCIUM) 950 (200 Ca) MG tablet Take 200 mg of elemental calcium by mouth daily.   Carboxymethylcell-Hypromellose (GENTEAL OP) Apply to eye.   Carboxymethylcellulose Sodium (LUBRICANT EYE DROPS OP) Apply 2 drops to eye 4 (four) times daily as needed (dry eyes).    citalopram (CELEXA) 40 MG tablet TAKE 1 TABLET BY MOUTH  DAILY   estradiol (ESTRACE) 0.1 MG/GM vaginal cream PLACE 1 APPLICATORFUL  VAGINALLY 3 TIMES A WEEK.   HYDROcodone-acetaminophen (NORCO) 7.5-325 MG tablet Take 1 tablet by mouth every 6 (six) hours as needed for moderate pain.   HYDROcodone-acetaminophen (NORCO) 7.5-325 MG tablet Take 1 tablet by mouth every 6 (six) hours as needed for moderate pain.   HYDROcodone-acetaminophen (NORCO) 7.5-325 MG tablet Take 1 tablet by mouth every 6 (six) hours as needed for moderate pain.   hydrocortisone 2.5 % cream APPLY TO AFFECTED AREA(S)  TOPICALLY TWICE DAILY   Lancets (ONETOUCH ULTRASOFT) lancets Use daily   levothyroxine (SYNTHROID) 125 MCG tablet TAKE 1 TABLET BY MOUTH DAILY   levothyroxine (SYNTHROID) 137 MCG tablet TAKE 1 TABLET BY MOUTH DAILY   lisinopril (ZESTRIL) 10 MG tablet TAKE  1 TABLET BY MOUTH  DAILY   meloxicam (MOBIC) 15 MG tablet TAKE 1 TABLET BY MOUTH DAILY   metFORMIN (GLUCOPHAGE) 1000 MG tablet TAKE 1 TABLET BY MOUTH  TWICE DAILY WITH MEALS   Misc Natural Products (TURMERIC CURCUMIN) CAPS Take by mouth.   Misc. Devices (BARIATRIC ROLLATOR) MISC 1 Rollator with basket.   Multiple Vitamin  (MULTIVITAMIN) tablet Take 1 tablet by mouth daily.   Omega-3 Fatty Acids (FISH OIL) 1200 MG CAPS Take 1 capsule by mouth daily.    ONE TOUCH ULTRA TEST test strip USE 1 DAILY AS DIRECTED   pantoprazole (PROTONIX) 40 MG tablet TAKE 1 TABLET BY MOUTH TWICE  DAILY BEFORE MEALS   pioglitazone (ACTOS) 15 MG tablet TAKE 1 TABLET BY MOUTH DAILY   pregabalin (LYRICA) 75 MG capsule Take 1 capsule (75 mg total) by mouth 2 (two) times daily.   tiZANidine (ZANAFLEX) 4 MG tablet TAKE 1 TABLET BY MOUTH 3 TIMES  DAILY AS NEEDED   tobramycin (TOBREX) 0.3 % ophthalmic solution Place 1 drop into both eyes every 6 (six) hours. Day before injection, day of injection and day after injection   traZODone (DESYREL) 100 MG tablet TAKE 1 TO 2 TABLETS BY MOUTH AT  BEDTIME   Vitamin D, Ergocalciferol, (DRISDOL) 1.25 MG (50000 UNIT) CAPS capsule TAKE 1 CAPSULE BY MOUTH  EVERY 7 DAYS FOR 12 DOSES   No facility-administered encounter medications on file as of 02/02/2022.   Contacted Katie Booker for General Review Call   Adherence Review:  Does the Clinical Pharmacist Assistant have access to adherence rates? Yes Adherence rates for STAR metric medications Pioglitazone (Actos) 15 mg - Last filled 01/09/22 100 DS at [facility] Pioglitazone (Actos) 15 mg - Last filled 10/06/21 100 DS at [facility] Metformin (Glucophage) 1000 mg - Last filled 12/03/21 100 DS at [facility] Metformin (Glucophage) 1000 mg - Last filled 08/01/21 100 DS at [facility] Lisinopril (Zestril) 10 mg - Last filled 12/27/2020 90 DS at [facility] Atorvastatin (Lipitor) 40 mg - Last filled 12/31/21 100 DS at [facility] Atorvastatin (Lipitor) 40 mg - Last filled 08/01/21 100 DS at [facility]   Does the patient have >5 day gap between last estimated fill dates for any of the above medications or other medication gaps? Yes Reason for medication gaps. Patient reports she is taking this medication and has at least 30 DS on hand she reports she was told to stop it at one time and that was  the reason for the lase in fill.  Do you receive your medications through PAP? No    Disease State Questions:  Able to connect with Patient? Yes Did patient have any problems with their health recently? Yes Note problems and Concerns: Patient reports her restless leg syndrome and balance issues are triggering her anxiety and she is not feeling her best. Have you had any admissions or emergency room visits or worsening of your condition(s) since last visit? No  Have you had any visits with new specialists or providers since your last visit? No  Have you had any new health care problem(s) since your last visit? No  Have you run out of any of your medications since you last spoke with clinical pharmacist? No  Are there any medications you are not taking as prescribed? Yes What kept you from taking your medications as prescribed? Patient reports she has been using her norco and her zanax even when she is  not due to have some relief from the restless leg or  using the lyrica and a muscle relaxant. She reports because of this she will be out of her xanax soon and will request a higher dose or fill on it from PCP Are you having any issues or side effects with your medications? No  Do you have any other health concerns or questions you want to discuss with your Clinical Pharmacist before your next visit? Yes Patient would like to know if there are any alternatives to the Ropinirole to help her, she states she had been taking it for a along time before it stopped working for her and was causing restless leg instead of stopping it. She reports she was fine with no symptoms for the first week or so of not taking it but since then has had about 10 "violent episodes" that trigger her anxiety and she has to use other medications for relief.  Are there any health concerns that you feel we can do a better job addressing? No Note Patient's response. Are you having any problems with any of the following since  the last visit: (select all that apply)  None  12. Any falls since last visit? Yes  Details: Patient reports she had a fall on last Friday, has been ok states was not serious but still dealing with some soreness 13. Any increased or uncontrolled pain since last visit? Yes  Details: Patient reports her legs at night Notes: Offered patient follow up with Clinical Pharm and she accepted. Inquired about her filling the Lisinopril she expresses compliance but stopped at a point under direction of PCP. Advised her that it is still listed as an active medication to be taking and she was in agreement.     Care Gaps: AWV- 02/05/21 Colonoscopy - Overdue Diabetic Urine - Overdue Foot Exam - Overdue COVID Booster - Overdue Flu Vaccine - Postponed CCM 02/25/21  Star Rating Drugs: Pioglitazone (Actos) 15 mg - Last filled 01/09/22 100 DS at [facility] Metformin (Glucophage) 1000 mg - Last filled 12/03/21 100 DS at [facility] Lisinopril (Zestril) 10 mg - Last filled 12/27/2020 90 DS at [facility] Atorvastatin (Lipitor) 40 mg - Last filled 12/31/21 100 DS at [facility]   Pamala Duffel CMA Clinical Pharmacist Assistant [phone]

## 2022-02-03 ENCOUNTER — Other Ambulatory Visit: Payer: Self-pay | Admitting: Internal Medicine

## 2022-02-03 DIAGNOSIS — H353123 Nonexudative age-related macular degeneration, left eye, advanced atrophic without subfoveal involvement: Secondary | ICD-10-CM | POA: Diagnosis not present

## 2022-02-03 DIAGNOSIS — H43813 Vitreous degeneration, bilateral: Secondary | ICD-10-CM | POA: Diagnosis not present

## 2022-02-03 DIAGNOSIS — F419 Anxiety disorder, unspecified: Secondary | ICD-10-CM

## 2022-02-03 DIAGNOSIS — H353211 Exudative age-related macular degeneration, right eye, with active choroidal neovascularization: Secondary | ICD-10-CM | POA: Diagnosis not present

## 2022-02-03 MED ORDER — ALPRAZOLAM 0.5 MG PO TABS: 0.5000 mg | ORAL_TABLET | Freq: Three times a day (TID) | ORAL | 1 refills | Status: DC | PRN

## 2022-02-08 DIAGNOSIS — H353210 Exudative age-related macular degeneration, right eye, stage unspecified: Secondary | ICD-10-CM | POA: Diagnosis not present

## 2022-02-09 ENCOUNTER — Telehealth: Payer: Medicare Other | Admitting: Internal Medicine

## 2022-02-09 ENCOUNTER — Other Ambulatory Visit: Payer: Self-pay | Admitting: Internal Medicine

## 2022-02-09 ENCOUNTER — Other Ambulatory Visit: Payer: Self-pay | Admitting: *Deleted

## 2022-02-09 DIAGNOSIS — M48062 Spinal stenosis, lumbar region with neurogenic claudication: Secondary | ICD-10-CM

## 2022-02-09 DIAGNOSIS — G2581 Restless legs syndrome: Secondary | ICD-10-CM

## 2022-02-09 MED ORDER — HYDROCODONE-ACETAMINOPHEN 7.5-325 MG PO TABS: 1.0000 | ORAL_TABLET | Freq: Four times a day (QID) | ORAL | 0 refills | Status: DC | PRN

## 2022-02-11 ENCOUNTER — Other Ambulatory Visit: Payer: Self-pay | Admitting: Adult Health

## 2022-02-11 DIAGNOSIS — E039 Hypothyroidism, unspecified: Secondary | ICD-10-CM

## 2022-02-15 NOTE — Telephone Encounter (Signed)
Result note on 02/05/21:  Henderson Cloud, MD 02/06/2021 12:46 PM EST     1. TSH is oversuppressed. Decrease levothyroxine to 125 mcg and recheck TSH in 6 weeks.

## 2022-02-17 ENCOUNTER — Other Ambulatory Visit: Payer: Self-pay | Admitting: Internal Medicine

## 2022-02-17 DIAGNOSIS — Z1231 Encounter for screening mammogram for malignant neoplasm of breast: Secondary | ICD-10-CM

## 2022-02-18 ENCOUNTER — Other Ambulatory Visit: Payer: Self-pay | Admitting: *Deleted

## 2022-02-18 DIAGNOSIS — E039 Hypothyroidism, unspecified: Secondary | ICD-10-CM

## 2022-02-18 MED ORDER — LEVOTHYROXINE SODIUM 125 MCG PO TABS: 125.0000 ug | ORAL_TABLET | Freq: Every day | ORAL | 1 refills | Status: DC

## 2022-02-23 ENCOUNTER — Telehealth: Payer: Self-pay | Admitting: Pharmacist

## 2022-02-23 NOTE — Progress Notes (Signed)
Reason for Encounter: Appointment Reminder  Contacted patient on 02/23/2022   Recent office visits:  01/13/2022 Katie Frohlich MD - Patient was seen for restless leg syndrome. Discontinued Ropinirole.   11/12/2021 Katie Frohlich MD - Patient was seen for Spinal stenosis, lumbar region, with neurogenic claudication and an additional concern. No mediation changes.   09/10/2021 Katie Frohlich MD - Patient was seen for Type 2 diabetes mellitus with other specified complication, without long-term current use of insulin and additional concerns. No medication changes.   Recent consult visits:  12/21/2021 Meridee Score MD (ortho) - Patient was seen for Achilles rupture, left, initial encounter. No medication changes.   10/19/2021 Lynne Leader MD (sport med) - Patient was seen for pain of left heel and an additional concern. No medication changes.   09/16/2021 Jena Gauss (Fam Med) - Patient was seen for low back pain. No additional chart notes.   09/14/2021 Lynne Leader MD (sport med) - Patient was seen for pain of left heel and an additional concern. No medication changes.   Hospital visits:  None  Medications: Outpatient Encounter Medications as of 02/23/2022  Medication Sig   ALPRAZolam (XANAX) 0.5 MG tablet Take 1 tablet (0.5 mg total) by mouth 3 (three) times daily as needed. for anxiety   atenolol (TENORMIN) 25 MG tablet TAKE 1 TABLET BY MOUTH DAILY   atorvastatin (LIPITOR) 40 MG tablet TAKE 1 TABLET BY MOUTH DAILY   calcium citrate (CALCITRATE - DOSED IN MG ELEMENTAL CALCIUM) 950 (200 Ca) MG tablet Take 200 mg of elemental calcium by mouth daily.   Carboxymethylcell-Hypromellose (GENTEAL OP) Apply to eye.   Carboxymethylcellulose Sodium (LUBRICANT EYE DROPS OP) Apply 2 drops to eye 4 (four) times daily as needed (dry eyes).    citalopram (CELEXA) 40 MG tablet TAKE 1 TABLET BY MOUTH  DAILY   estradiol (ESTRACE) 0.1 MG/GM vaginal cream PLACE 1 APPLICATORFUL  VAGINALLY 3  TIMES A WEEK.   HYDROcodone-acetaminophen (NORCO) 7.5-325 MG tablet Take 1 tablet by mouth every 6 (six) hours as needed for moderate pain.   HYDROcodone-acetaminophen (NORCO) 7.5-325 MG tablet Take 1 tablet by mouth every 6 (six) hours as needed for moderate pain.   HYDROcodone-acetaminophen (NORCO) 7.5-325 MG tablet Take 1 tablet by mouth every 6 (six) hours as needed for moderate pain.   hydrocortisone 2.5 % cream APPLY TO AFFECTED AREA(S)  TOPICALLY TWICE DAILY   Lancets (ONETOUCH ULTRASOFT) lancets Use daily   levothyroxine (SYNTHROID) 125 MCG tablet Take 1 tablet (125 mcg total) by mouth daily.   lisinopril (ZESTRIL) 10 MG tablet TAKE 1 TABLET BY MOUTH  DAILY   meloxicam (MOBIC) 15 MG tablet TAKE 1 TABLET BY MOUTH DAILY   metFORMIN (GLUCOPHAGE) 1000 MG tablet TAKE 1 TABLET BY MOUTH  TWICE DAILY WITH MEALS   Misc Natural Products (TURMERIC CURCUMIN) CAPS Take by mouth.   Misc. Devices (BARIATRIC ROLLATOR) MISC 1 Rollator with basket.   Multiple Vitamin (MULTIVITAMIN) tablet Take 1 tablet by mouth daily.   Omega-3 Fatty Acids (FISH OIL) 1200 MG CAPS Take 1 capsule by mouth daily.    ONE TOUCH ULTRA TEST test strip USE 1 DAILY AS DIRECTED   pantoprazole (PROTONIX) 40 MG tablet TAKE 1 TABLET BY MOUTH TWICE  DAILY BEFORE MEALS   pioglitazone (ACTOS) 15 MG tablet TAKE 1 TABLET BY MOUTH DAILY   pregabalin (LYRICA) 75 MG capsule Take 1 capsule (75 mg total) by mouth 2 (two) times daily.   tiZANidine (ZANAFLEX) 4 MG tablet TAKE 1  TABLET BY MOUTH 3 TIMES  DAILY AS NEEDED   tobramycin (TOBREX) 0.3 % ophthalmic solution Place 1 drop into both eyes every 6 (six) hours. Day before injection, day of injection and day after injection   traZODone (DESYREL) 100 MG tablet TAKE 1 TO 2 TABLETS BY MOUTH AT  BEDTIME   Vitamin D, Ergocalciferol, (DRISDOL) 1.25 MG (50000 UNIT) CAPS capsule TAKE 1 CAPSULE BY MOUTH  EVERY 7 DAYS FOR 12 DOSES   No facility-administered encounter medications on file as of 02/23/2022.    Lab Results  Component Value Date/Time   HGBA1C 5.9 (A) 09/10/2021 09:53 AM   HGBA1C 6.1 02/05/2021 09:59 AM   HGBA1C 5.7 (A) 11/06/2020 02:50 PM   HGBA1C 6.2 08/15/2018 09:25 AM   MICROALBUR <0.7 04/19/2016 08:42 AM   MICROALBUR 1.3 01/19/2016 01:43 PM    BP Readings from Last 3 Encounters:  10/19/21 138/78  09/14/21 (!) 150/86  09/10/21 (!) 110/56    Unsuccessful attempt to reach patient. Left patient message reminding patient of appointment. Via the telephone  with Katie Booker PharmD, on 02/24/2022 at 3:00.   Star Rating Drugs:  Atorvastatin 40 mg - last filled 12/31/2021 100 DS at [facility] Lisinopril 10 mg  - last filled 12/27/2021 90 DS at [facility] Metformin 1000 mg - last filled 12/03/2021 100 DS at [facility] Pioglitazone 15 mg  - last filled 01/09/2022 100 DS at [place]: AWV - 02/23/22 message to Katie Booker Last BP - 138/78 on 10/19/2021 Last A1C - 5.9 on 09/10/2021 AWV - never done Colonoscopy - never done Urine ACR - overdue Foot exam - overdue Covid - overdue eGFR - overdue Flu - postponed  [place] Pharmacist Assistant [phone]

## 2022-02-24 ENCOUNTER — Ambulatory Visit: Payer: Self-pay | Admitting: Pharmacist

## 2022-02-24 ENCOUNTER — Telehealth: Payer: Medicare Other

## 2022-02-24 ENCOUNTER — Other Ambulatory Visit: Payer: Self-pay | Admitting: Internal Medicine

## 2022-02-24 DIAGNOSIS — E119 Type 2 diabetes mellitus without complications: Secondary | ICD-10-CM

## 2022-02-24 DIAGNOSIS — I10 Essential (primary) hypertension: Secondary | ICD-10-CM

## 2022-02-24 DIAGNOSIS — E785 Hyperlipidemia, unspecified: Secondary | ICD-10-CM

## 2022-02-24 DIAGNOSIS — G8929 Other chronic pain: Secondary | ICD-10-CM

## 2022-02-24 DIAGNOSIS — E1169 Type 2 diabetes mellitus with other specified complication: Secondary | ICD-10-CM

## 2022-02-24 NOTE — Progress Notes (Signed)
Care Management & Coordination Services Pharmacy Note  02/24/2022 Name:  Katie Booker MRN:  [id number] DOB:  Nov 28, 1945  Summary: Pt has been experiencing more anxiety lately Pt is having more RLS symptoms  Recommendations/Changes made from today's visit: -Recommended for patient to call behavioral health to set up appointment -Recommended discussion with PCP about starting pramipexole   Follow up plan: Scheduled a PCP visit for next week to discuss RLS medications BP assessment in 1-2 months   Subjective: Katie Booker is an 77 y.o. year old female who is a primary patient of Isaac Bliss, Rayford Halsted, MD.  The care coordination team was consulted for assistance with disease management and care coordination needs.    Engaged with patient by telephone for follow up visit.  Patient Care Team: Isaac Bliss, Rayford Halsted, MD as PCP - General (Internal Medicine) Viona Gilmore, [facility] as Pharmacist (Pharmacist)  Recent office visits: 01/13/22 Isaac Bliss, Rayford Halsted, MD  - Patient presented via video for Restless leg syndrome. No medication changes.    11/12/21 Isaac Bliss, Rayford Halsted, MD  - Patient presented via video for Spinal stenosis of lumbar region with neurogenic claudication and other concerns. No medication changes.    09/10/21 Isaac Bliss, Rayford Halsted, MD  - Patient presented for Type 2 diabetes mellitus with other specified complication without long term current use of insulin and other concerns. No medication changes.   Recent consult visits: 12/21/21 Newt Minion, MD (Orthopedic Surg) - Patient presented for Achilles rupture left. No medication changes.    11/12/21  Newt Minion, MD (Orthopedic Surg) - Patient presented for Achilles rupture left. No medication changes.    11/09/21 Lyndee Hensen, PT  - Patient presented for difficulty in walking not elsewhere classified and other concerns. No medication changes.   10/19/21 - Patient presented to  [facility] on 10/19/21 for LE Venous DVT. No medication changes.    10/19/21 Gregor Hams, MD  - Patient presented for Pain of left heel and other concerns. No medication changes.    09/16/21 [place] encounter for Low back pain. No other visit details available.   09/14/21 Gregor Hams, MD  - Patient presented for Pain of left heel and other concerns. No medication changes.   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 No other visit details available.   Hospital visits: None in previous 6 months   Objective:  Lab Results  Component Value Date   CREATININE 1.15 02/05/2021   BUN 25 (H) 02/05/2021   GFR 46.79 (L) 02/05/2021   GFRNONAA >60 12/28/2017   GFRAA >60 12/28/2017   NA 140 02/05/2021   K 4.9 02/05/2021   CALCIUM 9.8 02/05/2021   CO2 28 02/05/2021   GLUCOSE 87 02/05/2021    Lab Results  Component Value Date/Time   HGBA1C 5.9 (A) 09/10/2021 09:53 AM   HGBA1C 6.1 02/05/2021 09:59 AM   HGBA1C 5.7 (A) 11/06/2020 02:50 PM   HGBA1C 6.2 08/15/2018 09:25 AM   GFR 46.79 (L) 02/05/2021 09:59 AM   GFR 59.15 (L) 05/16/2020 10:07 AM   MICROALBUR <0.7 04/19/2016 08:42 AM   MICROALBUR 1.3 01/19/2016 01:43 PM    Last diabetic Eye exam:  Lab Results  Component Value Date/Time   HMDIABEYEEXA No Retinopathy 12/14/2017 12:00 AM    Last diabetic Foot exam: No results found for: "HMDIABFOOTEX"   Lab Results  Component Value  Date   CHOL 158 02/05/2021   HDL 69.10 02/05/2021   LDLCALC 59 02/05/2021   LDLDIRECT 70.0 08/15/2018   TRIG 148.0 02/05/2021   CHOLHDL 2 02/05/2021       Latest Ref Rng & Units 02/05/2021    9:59 AM 12/05/2019    9:41 AM 08/15/2018    9:25 AM  Hepatic Function  Total Protein 6.0 - 8.3 g/dL 6.7  6.3  6.3   Albumin 3.5 - 5.2 g/dL 4.2   4.3   AST 0 - 37 U/L _0 ALT 0 - 35 U/L _1 Alk Phosphatase 39 - 117 U/L 63    69   Total Bilirubin 0.2 - 1.2 mg/dL 0.3  0.3  0.3     Lab Results  Component Value Date/Time   TSH 1.12 03/25/2021 01:15 PM   TSH 0.34 (L) 02/05/2021 09:59 AM       Latest Ref Rng & Units 02/05/2021    9:59 AM 12/05/2019    9:41 AM 08/15/2018    9:25 AM  CBC  WBC 4.0 - 10.5 K/uL 4.5  7.4  10.5   Hemoglobin 12.0 - 15.0 g/dL 11.7  11.8  12.4   Hematocrit 36.0 - 46.0 % 35.6  35.8  36.8   Platelets 150.0 - 400.0 K/uL 175.0  189  202.0     Lab Results  Component Value Date/Time   VD25OH 46.32 02/05/2021 09:59 AM   VD25OH 39 12/05/2019 09:41 AM   VD25OH 63.02 08/15/2018 09:25 AM   VITAMINB12 411 02/05/2021 09:59 AM   VITAMINB12 506 12/05/2019 09:41 AM    Clinical ASCVD: No  The 10-year ASCVD risk score (Arnett DK, et al., 2019) is: 40.7%   Values used to calculate the score:     Age: 31 years     Sex: Female     Is Non-Hispanic African American: No     Diabetic: Yes     Tobacco smoker: No     Systolic Blood Pressure: 366 mmHg     Is BP treated: Yes     HDL Cholesterol: 69.1 mg/dL     Total Cholesterol: 158 mg/dL       01/13/2022    3:24 PM 09/10/2021   10:21 AM 02/05/2021    9:48 AM  Depression screen PHQ 2/9  Decreased Interest 1 2 0  Down, Depressed, Hopeless 1 1 0  PHQ - 2 Score 2 3 0  Altered sleeping 0 2 3  Tired, decreased energy _2 Change in appetite 0 2 3  Feeling bad or failure about yourself  0 0 1  Trouble concentrating 1 1 0  Moving slowly or fidgety/restless 2 0 0  Suicidal thoughts 0 0 0  PHQ-9 Score _3 Difficult doing work/chores  Somewhat difficult Not difficult at all     Social History   Tobacco Use  Smoking Status Former   Packs/day: 1.00   Years: 20.00   Total pack years: 20.00   Types: Cigarettes   Quit date: 02/23/2004   Years since quitting: 18.0  Smokeless Tobacco Never  Tobacco Comments   quit 2007   BP Readings from Last 3 Encounters:  10/19/21 138/78  09/14/21 (!) 150/86  09/10/21 (!) 110/56   Pulse  Readings from Last 3 Encounters:  10/19/21 77  09/14/21 85  09/10/21 75   Wt Readings from Last 3 Encounters:  10/19/21 195 lb 3.2  oz (88.5 kg)  09/14/21 198 lb 12.8 oz (90.2 kg)  09/10/21 198 lb 9.6 oz (90.1 kg)   BMI Readings from Last 3 Encounters:  10/19/21 38.12 kg/m  09/14/21 38.83 kg/m  09/10/21 38.79 kg/m    Allergies  Allergen Reactions   Duraprep [Antiseptic Products, Misc.] Itching and Rash    Irritation everywhere prep was used   Advil Allergy Sinus [Chlorpheniramine-Pse-Ibuprofen]     Nervousness    Betadine [Povidone Iodine] Other (See Comments)    Burning sensation.    Hydromorphone Hcl Other (See Comments)    Makes crazy   Morphine And Related Other (See Comments)    hallucinations    Medications Reviewed Today     Reviewed by Agnes Lawrence, CMA (Certified Medical Assistant) on 01/13/22 at [place] List Status: <None>   Medication Order Taking? Sig Documenting Provider Last Dose Status Informant  ALPRAZolam (XANAX) 0.5 MG tablet [id number] Yes Take 1 tablet (0.5 mg total) by mouth 2 (two) times daily as needed. for anxiety Isaac Bliss, Rayford Halsted, MD Taking Active   atenolol (TENORMIN) 25 MG tablet [id number] Yes TAKE 1 TABLET BY MOUTH DAILY Isaac Bliss, Rayford Halsted, MD Taking Active   atorvastatin (LIPITOR) 40 MG tablet [id number] Yes TAKE 1 TABLET BY MOUTH DAILY Isaac Bliss, Rayford Halsted, MD Taking Active   calcium citrate (CALCITRATE - DOSED IN MG ELEMENTAL CALCIUM) 950 (200 Ca) MG tablet [id number] Yes Take 200 mg of elemental calcium by mouth daily. [provider] Taking Active   Carboxymethylcell-Hypromellose [facility] OP) [id number] Yes Apply to eye. [provider] Taking Active   Carboxymethylcellulose Sodium (LUBRICANT EYE DROPS OP) [id number] Yes Apply 2 drops to eye 4 (four) times daily as needed (dry eyes).  [provider] Taking Active Self  citalopram (CELEXA) 40 MG tablet [id number] Yes TAKE 1 TABLET BY MOUTH   DAILY Isaac Bliss, Rayford Halsted, MD Taking Active   estradiol (ESTRACE) 0.1 MG/GM vaginal cream [id number] Yes PLACE 1 APPLICATORFUL  VAGINALLY 3 TIMES A WEEK. Isaac Bliss, Rayford Halsted, MD Taking Active   HYDROcodone-acetaminophen [facility]) 7.5-325 MG tablet [id number] Yes Take 1 tablet by mouth every 6 (six) hours as needed for moderate pain. Isaac Bliss, Rayford Halsted, MD Taking Active   HYDROcodone-acetaminophen [facility]) 7.5-325 MG tablet [id number] Yes Take 1 tablet by mouth every 6 (six) hours as needed for moderate pain. Isaac Bliss, Rayford Halsted, MD Taking Active   HYDROcodone-acetaminophen [facility]) 7.5-325 MG tablet [id number] Yes Take 1 tablet by mouth every 6 (six) hours as needed for moderate pain. Isaac Bliss, Rayford Halsted, MD Taking Active   hydrocortisone 2.5 % cream [id number] Yes APPLY TO AFFECTED AREA(S)  TOPICALLY TWICE DAILY Isaac Bliss, Rayford Halsted, MD Taking Active   Lancets [facility] ULTRASOFT) lancets [id number] Yes Use daily Marletta Lor, MD Taking Active Self  levothyroxine (SYNTHROID) 125 MCG tablet [id number] Yes TAKE 1 TABLET BY MOUTH DAILY Isaac Bliss, Rayford Halsted, MD Taking Active   levothyroxine (SYNTHROID) 137 MCG tablet [id number] Yes TAKE 1 TABLET BY MOUTH DAILY Nafziger, Tommi Rumps, NP Taking Active   lisinopril (ZESTRIL) 10 MG tablet [id number] Yes TAKE 1 TABLET BY MOUTH  DAILY Isaac Bliss, Rayford Halsted, MD Taking Active   meloxicam (MOBIC) 15 MG tablet [id number] Yes TAKE 1 TABLET BY MOUTH DAILY Isaac Bliss, Rayford Halsted, MD Taking Active   metFORMIN (GLUCOPHAGE) 1000 MG tablet [id number] Yes TAKE 1 TABLET BY MOUTH  TWICE DAILY WITH MEALS Isaac Bliss, Rayford Halsted, MD Taking Active   Misc Natural  Products ([place]) [place] [id number] Yes Take by mouth. [provider] Taking Active   Misc. Devices (BARIATRIC ROLLATOR) MISC [id number] Yes 1 Rollator with basket. Isaac Bliss, Rayford Halsted, MD Taking Active   Multiple Vitamin (MULTIVITAMIN) tablet  [id number] Yes Take 1 tablet by mouth daily. [provider] Taking Active Self  Omega-3 Fatty Acids (FISH OIL) 1200 MG CAPS [id number] Yes Take 1 capsule by mouth daily.  [provider] Taking Active Self  ONE TOUCH ULTRA TEST test strip [id number] Yes USE 1 DAILY AS DIRECTED Marletta Lor, MD Taking Active Self  pantoprazole (PROTONIX) 40 MG tablet [id number] Yes TAKE 1 TABLET BY MOUTH TWICE  DAILY BEFORE MEALS Isaac Bliss, Rayford Halsted, MD Taking Active   pioglitazone (ACTOS) 15 MG tablet [id number] Yes TAKE 1 TABLET BY MOUTH DAILY Isaac Bliss, Rayford Halsted, MD Taking Active   pregabalin (LYRICA) 75 MG capsule [id number] Yes Take 1 capsule (75 mg total) by mouth 2 (two) times daily. Isaac Bliss, Rayford Halsted, MD Taking Active   rOPINIRole (REQUIP) 1 MG tablet [id number] Yes TAKE 1 TABLET BY MOUTH AT  BEDTIME Isaac Bliss, Rayford Halsted, MD Taking Active   tiZANidine (ZANAFLEX) 4 MG tablet [id number] Yes TAKE 1 TABLET BY MOUTH 3 TIMES  DAILY AS NEEDED Isaac Bliss, Rayford Halsted, MD Taking Active   tobramycin (TOBREX) 0.3 % ophthalmic solution [id number] Yes Place 1 drop into both eyes every 6 (six) hours. Day before injection, day of injection and day after injection [provider] Taking Active   traZODone (DESYREL) 100 MG tablet [id number] Yes TAKE 1 TO 2 TABLETS BY MOUTH AT  BEDTIME Isaac Bliss, Rayford Halsted, MD Taking Active   Vitamin D, Ergocalciferol, (DRISDOL) 1.25 MG (50000 UNIT) CAPS capsule [id number] Yes TAKE 1 CAPSULE BY MOUTH  EVERY 7 DAYS FOR 59 DOSES Isaac Bliss, Rayford Halsted, MD Taking Active             Patient Active Problem List   Diagnosis Date Noted   Complete rupture of left Achilles tendon 11/03/2021   Morbid obesity ([place]) 01/13/2018   S/P knee replacement 12/27/2017   Spondylolisthesis at L1-L2 level 03/18/2015   Spondylosis with myelopathy 09/02/2014   Cervical spondylosis with myelopathy 09/02/2014   Anxiety 12/21/2012   Chronic back  pain 12/21/2012   Insomnia 12/21/2012   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, L2-5 12/15/2012   Spinal stenosis, lumbar region, with neurogenic claudication 10/26/2012   Spinal stenosis, thoracic 14/38/8875   Umbilical hernia 79/72/8206   OSA (obstructive sleep apnea) 11/12/2011   Exogenous obesity 11/12/2011   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   Hypothyroidism 09/18/2009   Diabetes mellitus without complication ([place]) 01/56/1537   Dyslipidemia 09/18/2009   Anemia 09/18/2009   Depression 09/18/2009   Essential hypertension 09/18/2009   GERD 09/18/2009   Primary localized osteoarthritis of left knee 09/18/2009    Immunization History  Administered Date(s) Administered   Fluad Quad(high Dose 65+) 12/05/2019, 11/06/2020   Influenza Split 01/28/2011, 11/12/2011   Influenza Whole 11/20/2009   Influenza, High Dose Seasonal PF 12/27/2013, 12/25/2014, 01/19/2016, 12/01/2016, 01/13/2018, 10/26/2018   Influenza,inj,Quad PF,6+ Mos 12/18/2012   PFIZER Comirnaty(Gray Top)Covid-19 Tri-Sucrose Vaccine 06/30/2020   PFIZER(Purple Top)SARS-COV-2 Vaccination 03/16/2019, 04/03/2019, 11/21/2019   Pneumococcal Conjugate-13 12/27/2013   Pneumococcal Polysaccharide-23 11/12/2011   Tdap 07/17/2013   Zoster Recombinat (Shingrix) 11/08/2017, 03/15/2018   Compliance/Adherence/Medication fill history: Care Gaps: Influenza, colonoscopy, urine microalbumin, COVID booster, eGFR, foot exam, AWV Last BP - 138/78 on 10/19/2021 Last A1C - 5.9 on 09/10/2021  Star-Rating Drugs: Atorvastatin 40 mg - last filled 12/31/2021 100 DS at [facility] Lisinopril 10 mg  - last filled 12/27/2021 90 DS at [facility] Metformin 1000 mg - last filled 12/03/2021 100 DS at [facility] Pioglitazone 15 mg  - last filled 01/09/2022 100 DS at [facility]  SDOH:  (Social Determinants of Health) assessments and interventions performed: Yes  SDOH Interventions    Flowsheet Row Office Visit from 09/10/2021 in [place] at [place] from 02/05/2021 in [place] at [place] from 11/06/2020 in [place] at [place] [facility] from 05/12/2020 in [place] at [place]  SDOH Interventions      Transportation Interventions -- -- -- Intervention Not Indicated  Depression Interventions/Treatment  Medication Medication Currently on Treatment --  Financial Strain Interventions -- -- -- Intervention Not Indicated      SDOH Screenings   Food Insecurity: No Food Insecurity (09/08/2021)  Housing: Low Risk  (09/08/2021)  Transportation Needs: No Transportation Needs (09/08/2021)  Alcohol Screen: Low Risk  (09/08/2021)  Depression (PHQ2-9): Medium Risk (01/13/2022)  Financial Resource Strain: Low Risk  (05/14/2020)  Physical Activity: Unknown (09/08/2021)  Social Connections: Socially Isolated (09/08/2021)  Stress: No Stress Concern Present (09/08/2021)  Tobacco Use: Medium Risk (01/13/2022)    Medication Assistance: None required.  Patient affirms current coverage meets needs.  Medication Access: Within the past 30 days, how often has patient missed a dose of medication? none Is a pillbox or other method used to improve adherence? Yes  Factors that may affect medication adherence? financial need Are meds synced by current pharmacy? No  Are meds delivered by current pharmacy? Yes  Does patient experience delays in picking up medications due to transportation concerns? No   [facility] Reviewed: Is patient disadvantaged to use [facility]?: Yes  Current Rx insurance plan: [facility] Name and location of Current pharmacy:  [facility] [address], [place] [address] [place] [address] Phone: [phone] Fax: [phone]  [place], [address] [place] [address] Phone: [phone] Fax:  [phone]  [facility] services reviewed with patient today?: No  Patient requests to transfer care to [facility]?: No  Reason patient declined to change pharmacies: Disadvantaged due to insurance/mail order  Patient is working on controlling her restless legs by relaxing. She is trying to have a friend or daughter help her. Patient's daughter works as a Counselling psychologist. She reports she had been successful on New Year's Day with an episode of restless legs. She has been using more of her hydrocodone and Xanax and Lyrica. She felt like the ropinirole made the RLS symptoms worse.  She is doing sugar free and caffeine free soda. She drinks 2 cups of coffee every morning and will try to back down on her coffee and chocolate consumption.  She reports she is a people pleaser. She has a neighbor that gives her anxiety because she doesn't want to talk to them often.   She is also having issues with balance and she can't walk across the room without her walker. She was doing PT for her achilles tendon and they were having her do an exercise that was causing her achilles tendon to worsen. She has fallen a lot and fallen outside a couple of weeks ago.  Assessment/Plan  Patient Care Plan: [place]  Plan     Problem Identified: Problem: Hypertension, Hyperlipidemia, Diabetes, GERD, Hypothyroidism, Depression, Anxiety, Allergic Rhinitis and OSA, pain      Long-Range Goal: Patient-Specific Goal   Start Date: 05/12/2020  Expected End Date: 05/12/2021  Recent Progress: On track  Priority: High  Note:   Hypertension (BP goal <130/80) -Controlled -Current treatment: Atenolol 25 mg 1 tablet daily (in AM) - Appropriate, Effective, Safe, Accessible Lisinopril 10 mg 1 tablet daily (in AM) - Appropriate, Effective, Safe, Accessible -Medications previously tried: none -Current home readings: does not regularly check at home (bought one) -Current dietary habits: does not eat  as well as she should -Current exercise habits: no regular exercise -Denies hypotensive/hypertensive symptoms -Educated on Exercise goal of 150 minutes per week; Importance of home blood pressure monitoring; Proper BP monitoring technique; -Counseled to monitor BP at home weekly, document, and provide log at future appointments -Counseled on diet and exercise extensively Recommended to continue current medication  Hyperlipidemia: (LDL goal < 70) -Controlled -Current treatment: Atorvastatin 40 mg 1 tablet daily - Appropriate, Effective, Safe, Accessible Fish oil 1200 mg 1 capsule daily - Query Appropriate, Query effective, Safe, Accessible -Medications previously tried: gemfibrozil (drug interaction with atorvastatin) -Current dietary patterns: does not eat many vegetables -Current exercise habits: no structured exercise -Educated on Cholesterol goals;  Importance of limiting foods high in cholesterol; Exercise goal of 150 minutes per week; -Recommended repeat lipid panel.  Diabetes (A1c goal <7%) -Controlled -Current medications: Metformin 1000 mg 1 tablet twice daily with meals - Appropriate, Effective, Safe, Accessible Pioglitazone 15 mg 1 tablet daily - Appropriate, Effective, Safe, Accessible -Medications previously tried: glipizide (lows)  -Current home glucose readings: doesn't check blood sugar regularly fasting glucose: n/a post prandial glucose: n/a -Denies hypoglycemic/hyperglycemic symptoms -Current meal patterns:  breakfast: apple slices and peanut butter, 2 cup of coffee with half an half lunch: n/a dinner: chicken salad with bread or butter lettuce snacks: candy, potato chips, caramel corn drinks: caffeine free diet pepsi, no sugary drinks -Current exercise: no structured exercise -Educated on A1c and blood sugar goals; Exercise goal of 150 minutes per week; Benefits of routine self-monitoring of blood sugar; Carbohydrate counting and/or plate  method -Counseled to check feet daily and get yearly eye exams -Recommended to continue current medication  Depression/Anxiety (Goal: minimize symptoms) -Uncontrolled -Current treatment: Citalopram 40 mg 1 tablet daily - Appropriate, Query effective, Safe, Accessible Alprazolam 0.5 mg 1 tablet twice daily as needed - Appropriate, Query effective, Query Safe, Accessible -Medications previously tried/failed: Wellbutrin & Effexor (split personalities), Prozac, Lexapro, Zoloft, amitriptyline -PHQ9: 10 -GAD7: n/a -Educated on Benefits of medication for symptom control Benefits of cognitive-behavioral therapy with or without medication -Recommended to continue current medication Recommended for patient to call behavioral health to set up appointment  Insomnia (Goal: improve quality and quantity of sleep) -Uncontrolled -Current treatment  Trazodone 100 mg 1.5 tablets at bedtime as needed - Appropriate, Query effective, Safe, Accessible -Medications previously tried: Ambien  -Counseled on practicing good sleep hygiene by setting a sleep schedule and maintaining it, avoid excessive napping, following a nightly routine, avoiding screen time for 30-60 minutes before going to bed, and making the bedroom a cool, quiet and dark space Recommended tapering further to alternating trazodone to 1.5 tablets and 1 tablet every other night.  Pain (Goal: minimize symptoms) -Uncontrolled -Current treatment  Pregabalin 75 mg 1 capsule twice daily - Appropriate, Query effective, Safe, Accessible Hydrocodone-APAP 7.5-325 1 tablet every 6 hours as needed - Appropriate, Effective, Query Safe, Accessible  Tizanidine 4 mg tablet 3 times a day (mostly taking 2 as needed) - Appropriate, Effective, Query Safe, Accessible Meloxicam 15 mg 1 tablet daily - Appropriate, Effective, Query Safe, Accessible -Medications previously tried: n/a -Recommended to continue current medication Counseled on avoidance of taking  Hydrocodone, Tizanidine and Alprazolam at the same time  GERD (Goal: minimize symptoms) -Controlled -Current treatment  Pantoprazole 40 mg 1 tablet twice daily before meals as needed - Query Appropriate, Effective, Safe, Accessible -Medications previously tried: n/a -Counseled on non-pharmacologic management of symptoms such as elevating the head of your bed, avoiding eating 2-3 hours before bed, avoiding triggering foods such as acidic, spicy, or fatty foods, eating smaller meals, and wearing clothes that are loose around the waist  Vitamin D deficiency (Goal: vitamin D 30-100) -Controlled -Current treatment  Ergocalciferol 50,000 units 1 tablet at bedtime - Appropriate, Effective, Safe, Accessible -Medications previously tried: n/a  -Recommended to continue current medication Counseled on taking with food to help absorption  Allergic rhinitis (Goal: minimize symptoms) -Controlled -Current treatment  Loratadine 10 mg 1 tablet daily as needed - Appropriate, Effective, Safe, Accessible -Medications previously tried: Advil (anxious)  -Recommended to continue current medication  Hypothyroidism (Goal: TSH 0.35-4.5) -Controlled -Current treatment  Levothyroxine 125 mcg 1 tablet daily - Appropriate, Effective, Safe, Accessible -Medications previously tried: none  -Counseled on taking on an empty stomach prior to all other food and medications  Restless legs syndrome (Goal: minimize symptoms) -Controlled -Current treatment  Pregabalin 75 mg 1 capsule twice daily - Appropriate, Query effective, Query Safe, Accessible Hydrocodone-APAP 7.5-325 1 tablet every 6 hours as needed - Query Appropriate, Query effective, Query Safe, Accessible Alprazolam 0.5 mg 1 tablet twice daily as needed - Query Appropriate, Query effective, Query Safe, Accessible -Medications previously tried: ropinirole (ineffective) -Recommended to continue current medication Counseled on avoidance of caffeine to minimize  symptoms  Osteopenia (Goal improve bone density and prevent fractures) -Uncontrolled -Last DEXA Scan: 05/09/20   T-Score femoral neck: -2.0, -2.4  T-Score total hip: n/a  T-Score lumbar spine: n/a  T-Score forearm radius: -1.5  10-year probability of major osteoporotic fracture: 14.3%  10-year probability of hip fracture: 4.0% -Patient is a candidate for pharmacologic treatment due to T-Score -1.0 to -2.5 and 10-year risk of hip fracture > 3% -Current treatment  Calcium-vitamin D daily 400 mg - 1 in the morning and 1 in the evening - Appropriate, Effective, Safe, Accessible -Medications previously tried: none -Recommend [phone] units of vitamin D daily. Recommend 1200 mg of calcium daily from dietary and supplemental sources. Recommend weight-bearing and muscle strengthening exercises for building and maintaining bone density. -Counseled on switching calcium carbonate to calcium citrate to improve absorption   Health Maintenance -Vaccine gaps: none -Current therapy:  Hydrocortisone 2.5% cream Multivitamin 1 tablet daily Docusate 100 mg 2 capsules at bedtime Preservision Areds 2 1 capsule twice daily Lubricant ([place]) Estradiol cream (prolapse) -Educated on Cost vs benefit of each product must be carefully weighed by individual consumer -Patient is satisfied with current therapy and denies issues -Recommended to continue current medication  Follow Up Plan: The care management team will reach out to the patient again over the next 60 days.      Jeni Salles, PharmD, [place] Pharmacist [facility]

## 2022-03-03 ENCOUNTER — Encounter: Payer: Medicare Other | Admitting: Internal Medicine

## 2022-03-03 MED ORDER — PRAMIPEXOLE DIHYDROCHLORIDE 0.125 MG PO TABS: 0.1250 mg | ORAL_TABLET | Freq: Every day | ORAL | 2 refills | Status: DC

## 2022-03-16 ENCOUNTER — Other Ambulatory Visit: Payer: Self-pay | Admitting: Internal Medicine

## 2022-03-16 NOTE — Progress Notes (Signed)
This encounter was created in error - please disregard.

## 2022-03-17 ENCOUNTER — Telehealth: Payer: Self-pay

## 2022-03-17 NOTE — Progress Notes (Signed)
  Chronic Care Management   Note  03/17/2022 Name: Acacia Latorre MRN: [id number] DOB: 1945-04-04  Katie Booker is a 77 y.o. year old female who is a primary care patient of Isaac Bliss, Rayford Halsted, MD. I reached out to [name] by phone today in response to a referral sent by [name] Cornman's PCP.  The first contact attempt was unsuccessful.   Follow up plan: Additional outreach attempts will be made.  Noreene Larsson, [place], [place] [address] Direct Dial: [phone] [email or web address]

## 2022-03-22 NOTE — Progress Notes (Signed)
  Chronic Care Management   Note  03/22/2022 Name: [name] MRN: [id number] DOB: 14-Dec-1945  Katie Booker is a 77 y.o. year old female who is a primary care patient of Isaac Bliss, Rayford Halsted, MD. I reached out to [name] by phone today in response to a referral sent by [name] Mazon's PCP.  [name] was given information about Chronic Care Management services today including:  CCM service includes personalized support from designated clinical staff supervised by the physician, including individualized plan of care and coordination with other care providers 24/7 contact phone numbers for assistance for urgent and routine care needs. Service will only be billed when office clinical staff spend 20 minutes or more in a month to coordinate care. Only one practitioner may furnish and bill the service in a calendar month. The patient may stop CCM services at amy time (effective at the end of the month) by phone call to the office staff. The patient will be responsible for cost sharing (co-pay) or up to 20% of the service fee (after annual deductible is met)  Ms. [name]  agreedto scheduling an appointment with the CCM RN Case Manager   Follow up plan: Patient agreed to scheduled appointment with RN Case Manager on 03/22/2022(date/time).   Noreene Larsson, [place], [place] [address] Direct Dial: [phone] [email or web address]

## 2022-03-23 DIAGNOSIS — H35372 Puckering of macula, left eye: Secondary | ICD-10-CM | POA: Diagnosis not present

## 2022-03-23 DIAGNOSIS — H353123 Nonexudative age-related macular degeneration, left eye, advanced atrophic without subfoveal involvement: Secondary | ICD-10-CM | POA: Diagnosis not present

## 2022-03-23 DIAGNOSIS — E119 Type 2 diabetes mellitus without complications: Secondary | ICD-10-CM | POA: Diagnosis not present

## 2022-03-23 DIAGNOSIS — H43813 Vitreous degeneration, bilateral: Secondary | ICD-10-CM | POA: Diagnosis not present

## 2022-03-23 DIAGNOSIS — H353211 Exudative age-related macular degeneration, right eye, with active choroidal neovascularization: Secondary | ICD-10-CM | POA: Diagnosis not present

## 2022-03-25 ENCOUNTER — Other Ambulatory Visit: Payer: Self-pay | Admitting: Internal Medicine

## 2022-03-26 ENCOUNTER — Encounter: Payer: Self-pay | Admitting: Diagnostic Neuroimaging

## 2022-03-26 ENCOUNTER — Ambulatory Visit: Payer: Medicare Other | Admitting: Diagnostic Neuroimaging

## 2022-03-26 VITALS — BP 145/69 | HR 66 | Ht 60.0 in | Wt 195.0 lb

## 2022-03-26 DIAGNOSIS — M4712 Other spondylosis with myelopathy, cervical region: Secondary | ICD-10-CM

## 2022-03-26 DIAGNOSIS — M48062 Spinal stenosis, lumbar region with neurogenic claudication: Secondary | ICD-10-CM | POA: Diagnosis not present

## 2022-03-26 DIAGNOSIS — F419 Anxiety disorder, unspecified: Secondary | ICD-10-CM | POA: Diagnosis not present

## 2022-03-26 DIAGNOSIS — G2581 Restless legs syndrome: Secondary | ICD-10-CM | POA: Diagnosis not present

## 2022-03-26 NOTE — Patient Instructions (Signed)
  RESTLESS LEG SYNDROME (diabetic neuropathy, lumbar spinal stenosis / radiculopathy) - continue pramipexole, lyrica, hydrocodone - could increase pramipexole to 0.125mg  twice a day - check B12 and iron studies at next PCP visit  ANXIETY / DEPRESSION / PANIC ATTACK / CHRONIC PAIN - consider referrals to psychiatry, psychology and pain mgmt

## 2022-03-26 NOTE — Progress Notes (Unsigned)
[facility]  PATIENT: Katie Booker DOB: [birth year]  REFERRING CLINICIAN: Philip Aspen, [place]* HISTORY FROM: PATIENT  REASON FOR VISIT: NEW CONSULT   HISTORICAL  CHIEF COMPLAINT:  Chief Complaint  Patient presents with   New Patient (Initial Visit)    Rm 6, with friend  NP internal referral for RLS States sx worsen within 2-3 months     HISTORY OF PRESENT ILLNESS:   77 year old female here for evaluation of restless leg syndrome.  Long history of RLS symptoms present for many years, treated with Requip.  Symptoms worsening for last few months.  She tried Lyrica, hydrocodone, pramipexole without relief.  Also having increasing anxiety on alprazolam citalopram.  Is having intermittent "attacks" where her entire body arms and legs are shaking, preceded by sensation of anxiety.  Present multiple surgeries of the cervical, thoracic and lumbar spine in the past.  Also has had knee surgery.  Has developed a left Achilles tendon tear.   REVIEW OF SYSTEMS: Full 14 system review of systems performed and negative with exception of: As per HPI.  ALLERGIES: Allergies  Allergen Reactions   Duraprep [Antiseptic Products, Misc.] Itching and Rash    Irritation everywhere prep was used   Advil Allergy Sinus [Chlorpheniramine-Pse-Ibuprofen]     Nervousness    Betadine [Povidone Iodine] Other (See Comments)    Burning sensation.    Hydromorphone Hcl Other (See Comments)    Makes crazy   Morphine And Related Other (See Comments)    hallucinations    HOME MEDICATIONS: Outpatient Medications Prior to Visit  Medication Sig Dispense Refill   ALPRAZolam (XANAX) 0.5 MG tablet Take 1 tablet (0.5 mg total) by mouth 3 (three) times daily as needed. for anxiety 90 tablet 1   atenolol (TENORMIN) 25 MG tablet TAKE 1 TABLET BY MOUTH DAILY 100 tablet 2   atorvastatin (LIPITOR) 40 MG tablet TAKE 1 TABLET BY MOUTH DAILY 100 tablet 2   calcium citrate (CALCITRATE - DOSED  IN MG ELEMENTAL CALCIUM) 950 (200 Ca) MG tablet Take 200 mg of elemental calcium by mouth daily.     Carboxymethylcell-Hypromellose (GENTEAL OP) Apply to eye.     Carboxymethylcellulose Sodium (LUBRICANT EYE DROPS OP) Apply 2 drops to eye 4 (four) times daily as needed (dry eyes).      citalopram (CELEXA) 40 MG tablet TAKE 1 TABLET BY MOUTH DAILY 90 tablet 0   estradiol (ESTRACE) 0.1 MG/GM vaginal cream PLACE 1 APPLICATORFUL  VAGINALLY 3 TIMES A WEEK. 170 g 3   HYDROcodone-acetaminophen (NORCO) 7.5-325 MG tablet Take 1 tablet by mouth every 6 (six) hours as needed for moderate pain. 120 tablet 0   HYDROcodone-acetaminophen (NORCO) 7.5-325 MG tablet Take 1 tablet by mouth every 6 (six) hours as needed for moderate pain. 120 tablet 0   HYDROcodone-acetaminophen (NORCO) 7.5-325 MG tablet Take 1 tablet by mouth every 6 (six) hours as needed for moderate pain. 120 tablet 0   hydrocortisone 2.5 % cream APPLY TO AFFECTED AREA(S)  TOPICALLY TWICE DAILY 85.05 g 1   Lancets (ONETOUCH ULTRASOFT) lancets Use daily 100 each 6   levothyroxine (SYNTHROID) 125 MCG tablet Take 1 tablet (125 mcg total) by mouth daily. 100 tablet 1   lisinopril (ZESTRIL) 10 MG tablet TAKE 1 TABLET BY MOUTH  DAILY 90 tablet 3   meloxicam (MOBIC) 15 MG tablet TAKE 1 TABLET BY MOUTH DAILY 100 tablet 2   metFORMIN (GLUCOPHAGE) 1000 MG tablet TAKE 1 TABLET BY MOUTH  TWICE DAILY WITH MEALS 200  tablet 2   Misc Natural Products (TURMERIC CURCUMIN) CAPS Take by mouth.     Misc. Devices (BARIATRIC ROLLATOR) MISC 1 Rollator with basket. 1 each 0   Multiple Vitamin (MULTIVITAMIN) tablet Take 1 tablet by mouth daily.     Omega-3 Fatty Acids (FISH OIL) 1200 MG CAPS Take 1 capsule by mouth daily.      ONE TOUCH ULTRA TEST test strip USE 1 DAILY AS DIRECTED 100 each 3   pantoprazole (PROTONIX) 40 MG tablet TAKE 1 TABLET BY MOUTH TWICE  DAILY BEFORE MEALS 200 tablet 1   pioglitazone (ACTOS) 15 MG tablet TAKE 1 TABLET BY MOUTH DAILY 100 tablet 0    pramipexole (MIRAPEX) 0.125 MG tablet Take 1 tablet (0.125 mg total) by mouth at bedtime. 30 tablet 2   pregabalin (LYRICA) 75 MG capsule Take 1 capsule (75 mg total) by mouth 2 (two) times daily. 180 capsule 1   tiZANidine (ZANAFLEX) 4 MG tablet TAKE 1 TABLET BY MOUTH 3 TIMES  DAILY AS NEEDED 180 tablet 1   tobramycin (TOBREX) 0.3 % ophthalmic solution Place 1 drop into both eyes every 6 (six) hours. Day before injection, day of injection and day after injection     traZODone (DESYREL) 100 MG tablet TAKE 1 TO 2 TABLETS BY MOUTH AT  BEDTIME 180 tablet 3   Vitamin D, Ergocalciferol, (DRISDOL) 1.25 MG (50000 UNIT) CAPS capsule TAKE 1 CAPSULE BY MOUTH  EVERY 7 DAYS FOR 12 DOSES 12 capsule 3   No facility-administered medications prior to visit.    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   Depression    DOE (dyspnea on exertion)    GERD    Headache    Hemorrhoids    History of basal cell carcinoma excision    left cheek   History of colon polyps    History of hyperthyroidism 2011   RAI treatement   History of panic attacks    Hyperlipidemia    Hypertension    Hypothyroidism, postradioiodine therapy    followed by pcp   IDA (iron deficiency anemia)    intermittant   Insomnia    OA (osteoarthritis)    OSA (obstructive sleep apnea)    no cpap, per pt tried unable to tolerate   Peripheral neuropathy    legs and feet from hx back surgery's   Rash    bilateral axilla area -- per pt due to some personal wipes used other the counter   RLS (restless legs syndrome)    Spondylolisthesis at L1-L2 level    Tingling of both upper extremities    left greater than right due to cervical pinched nerve   Type 2  diabetes mellitus ([place])    followed by pcp   Umbilical hernia    Urinary frequency    Urinary urgency     PAST SURGICAL HISTORY: Past Surgical History:  Procedure Laterality Date   ANTERIOR CERVICAL DECOMP/DISCECTOMY FUSION N/A 09/02/2014   Procedure: Cervical five- six  Anterior cervical decompression fusion;  Surgeon: Kristeen Miss, MD;  Location: [facility];  Service: Neurosurgery;  Laterality: N/A;  C5-6 Anterior cervical decompression/diskectomy/fusion  BREAST BIOPSY Right 10/2018   x 2 benign   CARPAL TUNNEL RELEASE Right 2016   CATARACT EXTRACTION W/ INTRAOCULAR LENS  IMPLANT, BILATERAL  03/2015   COLONOSCOPY  2007   ESOPHAGOGASTRODUODENOSCOPY  2007   HEMORRHOID SURGERY  03/23/2011   Procedure: HEMORRHOIDECTOMY;  Surgeon: Joyice Faster. Cornett, MD;  Location: [facility];  Service: General;  Laterality: N/A;  lateral internal sphincterotomy and hemorrhoidectomy   LUMBAR FUSION  02/2015   L1 -- 2   PARTIAL KNEE ARTHROPLASTY Left 12/27/2017   Procedure: UNICOMPARTMENTAL LEFT KNEE;  Surgeon: Marchia Bond, MD;  Location: [facility];  Service: Orthopedics;  Laterality: Left;   THORACIC DISCECTOMY N/A 12/15/2012   Procedure: Thoracic ten-eleven Thoracic laminectomy;  Surgeon: Kristeen Miss, MD;  Location: [facility];  Service: Neurosurgery;  Laterality: N/A;  Thoracic ten-eleven Thoracic laminectomy   TUBAL LIGATION Bilateral 1991    FAMILY HISTORY: Family History  Problem Relation Age of Onset   Diabetes Father    COPD Father    Cancer Maternal Grandfather        stomach    SOCIAL HISTORY: Social History   Socioeconomic History   Marital status: Single    Spouse name: Not on file   Number of children: Not on file   Years of education: Not on file   Highest education level: Some college, no degree  Occupational History   Occupation: retired  Tobacco Use   Smoking status: Former    Packs/day: 1.00    Years: 20.00    Total pack years: 20.00    Types:  Cigarettes    Quit date: 02/23/2004    Years since quitting: 18.0   Smokeless tobacco: Never   Tobacco comments:    quit 2007  Substance and Sexual Activity   Alcohol use: Yes    Alcohol/week: 0.0 standard drinks of alcohol    Comment: rare   Drug use: No   Sexual activity: Never    Birth control/protection: Post-menopausal  Other Topics Concern   Not on file  Social History Narrative   Not on file   Social Determinants of Health   Financial Resource Strain: Low Risk  (05/14/2020)   Overall Financial Resource Strain (CARDIA)    Difficulty of Paying Living Expenses: Not hard at all  Food Insecurity: No Food Insecurity (09/08/2021)   Hunger Vital Sign    Worried About Running Out of Food in the Last Year: Never true    Ran Out of Food in the Last Year: Never true  Transportation Needs: No Transportation Needs (09/08/2021)   PRAPARE - Hydrologist (Medical): No    Lack of Transportation (Non-Medical): No  Physical Activity: Unknown (09/08/2021)   Exercise Vital Sign    Days of Exercise per Week: 0 days    Minutes of Exercise per Session: Not on file  Stress: No Stress Concern Present (09/08/2021)   [place]    Feeling of Stress : Only a little  Social Connections: Socially Isolated (09/08/2021)   Social Connection and Isolation Panel [NHANES]    Frequency of Communication with Friends and Family: Twice a week    Frequency of Social Gatherings with Friends and Family: Once a week    Attends Religious Services: Never    Marine scientist or Organizations: No    Attends Music therapist: Not on file    Marital Status: Divorced  Intimate Partner Violence: Not  on file     PHYSICAL EXAM  GENERAL EXAM/CONSTITUTIONAL: Vitals:  Vitals:   03/26/22 1052  BP: (!) 145/69  Pulse: 66  Weight: 195 lb (88.5 kg)  Height: 5' (1.524 m)   Body mass index is 38.08 kg/m. Wt  Readings from Last 3 Encounters:  03/26/22 195 lb (88.5 kg)  10/19/21 195 lb 3.2 oz (88.5 kg)  09/14/21 198 lb 12.8 oz (90.2 kg)   Patient is in no distress; well developed, nourished and groomed; neck is supple  CARDIOVASCULAR: Examination of carotid arteries is normal; no carotid bruits Regular rate and rhythm, no murmurs Examination of peripheral vascular system by observation and palpation is normal  EYES: Ophthalmoscopic exam of optic discs and posterior segments is normal; no papilledema or hemorrhages No results found.  MUSCULOSKELETAL: Gait, strength, tone, movements noted in Neurologic exam below  NEUROLOGIC: MENTAL STATUS:      No data to display         awake, alert, oriented to person, place and time recent and remote memory intact normal attention and concentration language fluent, comprehension intact, naming intact fund of knowledge appropriate  CRANIAL NERVE:  2nd - no papilledema on fundoscopic exam 2nd, 3rd, 4th, 6th - pupils equal and reactive to light, visual fields full to confrontation, extraocular muscles intact, no nystagmus 5th - facial sensation symmetric 7th - facial strength symmetric 8th - hearing intact 9th - palate elevates symmetrically, uvula midline 11th - shoulder shrug symmetric 12th - tongue protrusion midline  MOTOR:  normal bulk and tone, full strength in the BUE, BLE; LIMITED IN LEFT FOOT DF DUE TO LEFT ACHILLES  SENSORY:  normal and symmetric to light touch, temperature, vibration  COORDINATION:  finger-nose-finger, fine finger movements normal  REFLEXES:  deep tendon reflexes BRISK IN BUE; TRACE IN BLE  GAIT/STATION:  narrow based gait; USING WALKER     DIAGNOSTIC DATA (LABS, IMAGING, TESTING) - I reviewed patient records, labs, notes, testing and imaging myself where available.  Lab Results  Component Value Date   WBC 4.5 02/05/2021   HGB 11.7 (L) 02/05/2021   HCT 35.6 (L) 02/05/2021   MCV 86.9 02/05/2021    PLT 175.0 02/05/2021      Component Value Date/Time   NA 140 02/05/2021 0959   K 4.9 02/05/2021 0959   CL 103 02/05/2021 0959   CO2 28 02/05/2021 0959   GLUCOSE 87 02/05/2021 0959   BUN 25 (H) 02/05/2021 0959   CREATININE 1.15 02/05/2021 0959   CREATININE 1.25 (H) 12/05/2019 0941   CALCIUM 9.8 02/05/2021 0959   PROT 6.7 02/05/2021 0959   ALBUMIN 4.2 02/05/2021 0959   AST 18 02/05/2021 0959   ALT 17 02/05/2021 0959   ALKPHOS 63 02/05/2021 0959   BILITOT 0.3 02/05/2021 0959   GFRNONAA >60 12/28/2017 0524   GFRAA >60 12/28/2017 0524   Lab Results  Component Value Date   CHOL 158 02/05/2021   HDL 69.10 02/05/2021   LDLCALC 59 02/05/2021   LDLDIRECT 70.0 08/15/2018   TRIG 148.0 02/05/2021   CHOLHDL 2 02/05/2021   Lab Results  Component Value Date   HGBA1C 5.9 (A) 09/10/2021   Lab Results  Component Value Date   VITAMINB12 411 02/05/2021   Lab Results  Component Value Date   TSH 1.12 03/25/2021    07/20/14 MRI cervical spine -Multilevel spondylosis as described. -Potentially significant cord compression is most notable at C2-3 and C5-6. -Disc material and/or uncinate spurring contributes to multilevel foraminal narrowing as described above.  Multilevel facet arthropathy appears worse on the LEFT.  04/09/16 MRI lumbar spine - L5-S1: Stable small disc bulge, posterior marginal osteophytes, and moderate facet hypertrophy with severe bilateral foraminal narrowing and moderate canal stenosis. 1. Status post L1 through L5 posterior instrumented fusion, interbody fusion, and multiple laminectomy. No significant canal stenosis or foraminal narrowing through levels of fusion. 2. Mild degenerative endplate edema at the T11-12 and T12-L1 levels. Stable spondylosis at T11 through L1 and at L5-S1.    ASSESSMENT AND PLAN  77 y.o. year old female here with:   Dx:  1. Restless leg syndrome   2. Cervical spondylosis with myelopathy   3. Spinal stenosis, lumbar  region, with neurogenic claudication   4. Anxiety      PLAN:  RESTLESS LEG SYNDROME (multi-factorial due to diabetic neuropathy, lumbar spinal stenosis / radiculopathy, cervical myelopathy) - ENCOURAGED GENTLE MOVEMENT / EXERCISE; consider PT evaluation - continue pramipexole, lyrica, hydrocodone - could increase pramipexole to 0.125mg  twice a day - check B12 and iron studies at next PCP visit   ANXIETY / DEPRESSION / PANIC ATTACK / CHRONIC PAIN - consider referrals to psychiatry, psychology and pain mgmt  Return for pending if symptoms worsen or fail to improve, return to PCP.    Suanne Marker, MD 03/26/2022, 11:32 AM Certified in Neurology, Neurophysiology and Neuroimaging  [facility] [address] [place], [place] [address] [phone]

## 2022-03-29 ENCOUNTER — Encounter: Payer: Self-pay | Admitting: Internal Medicine

## 2022-03-29 MED ORDER — LISINOPRIL 10 MG PO TABS: 10.0000 mg | ORAL_TABLET | Freq: Every day | ORAL | 0 refills | Status: DC

## 2022-04-01 ENCOUNTER — Ambulatory Visit: Payer: Medicare Other

## 2022-04-01 DIAGNOSIS — E1169 Type 2 diabetes mellitus with other specified complication: Secondary | ICD-10-CM

## 2022-04-01 DIAGNOSIS — I10 Essential (primary) hypertension: Secondary | ICD-10-CM

## 2022-04-05 ENCOUNTER — Encounter: Payer: Self-pay | Admitting: Internal Medicine

## 2022-04-05 ENCOUNTER — Encounter (HOSPITAL_BASED_OUTPATIENT_CLINIC_OR_DEPARTMENT_OTHER): Payer: Medicare Other | Admitting: Obstetrics & Gynecology

## 2022-04-05 DIAGNOSIS — F419 Anxiety disorder, unspecified: Secondary | ICD-10-CM

## 2022-04-06 ENCOUNTER — Other Ambulatory Visit: Payer: Self-pay | Admitting: Internal Medicine

## 2022-04-06 ENCOUNTER — Encounter: Payer: Self-pay | Admitting: Internal Medicine

## 2022-04-06 DIAGNOSIS — M48062 Spinal stenosis, lumbar region with neurogenic claudication: Secondary | ICD-10-CM

## 2022-04-06 DIAGNOSIS — G8929 Other chronic pain: Secondary | ICD-10-CM

## 2022-04-06 MED ORDER — PREGABALIN 75 MG PO CAPS: 75.0000 mg | ORAL_CAPSULE | Freq: Two times a day (BID) | ORAL | 0 refills | Status: DC

## 2022-04-06 MED ORDER — ALPRAZOLAM 0.5 MG PO TABS: 0.5000 mg | ORAL_TABLET | Freq: Three times a day (TID) | ORAL | 1 refills | Status: DC | PRN

## 2022-04-07 ENCOUNTER — Ambulatory Visit: Payer: Medicare Other

## 2022-04-07 DIAGNOSIS — I10 Essential (primary) hypertension: Secondary | ICD-10-CM

## 2022-04-07 NOTE — Chronic Care Management (AMB) (Signed)
Chronic Care Management   CCM RN Visit Note  04/07/2022 Name: Katie Booker MRN: [id number] DOB: [birth year]  Subjective: Katie Booker is a 77 y.o. year old female who is a primary care patient of Philip Aspen, Limmie Patricia, MD. The patient was referred to the Chronic Care Management team for assistance with care management needs subsequent to provider initiation of CCM services and plan of care.    Today's Visit:  Engaged with patient by telephone for follow up visit.        Goals Addressed             This Visit's Progress    Goal: CCM (Diabetes) Expected Outcome:  Monitor, Self-Manage And Reduce Symptoms of Diabetes       Current Barriers:  Chronic Disease Management support and education needs related to Diabetes  Planned Interventions: Reviewed provider's plan for diabetes management.  Reviewed medication. Reports taking medications as prescribed. Reports episodes of short-term memory loss but reports doing well with medication management. Discussed importance of blood glucose readings. Reports currently not monitoring. Advised to monitor fasting readings and maintain a log. Provided information regarding hypoglycemia and hyperglycemia along with appropriate interventions. Denies recent hypoglycemic or hyperglycemic episodes. Discussed nutritional intake and importance of complying with a diabetic diet. Reports attempting to adhere to recommended diet, but notes meal choices aren't always optimal. Discussed benefits of grocery delivery and options for meal delivery. Advised to increase consumption of fruits, vegetables, and lean proteins. Advised to monitor intake of carbohydrates and avoid foods and beverages with added sugar when possible.  Discussed importance of completing recommended DM preventive care. Reports completing daily foot care as advised. Reports eye is exam is up to date. Currently being treated for macular degeneration. Discussed importance of completing  ordered labs as prescribed.  Assessed social determinant of health barriers.   Lab Results  Component Value Date   HGBA1C 5.9 (A) 09/10/2021    Symptom Management: Attend all scheduled provider appointments Take medications as prescribed Monitor blood glucose and maintain a log Check feet daily for cuts, sores or redness Wash and dry feet carefully every day Wear comfortable, cotton socks Wear comfortable, well-fitting shoes Read food labels for fat, fiber, carbohydrates and portion size Limit intake of foods with added sugars Call provider office for new concerns or questions   Follow Up Plan:  Will follow up within the next month       Goal: CCM (Hypertension) Expected Outcome:  Monitor, Self-Manage And Reduce Symptoms of Hypertension       Current Barriers:  Chronic Disease Management support and education needs related to Hypertension  Planned Interventions: Reviewed current treatment plan related to Hypertension, self-management, and adherence to plan as established by provider.  Reviewed medications and indications for use. Reports taking medications as prescribed. Reports tolerating regimen. Reports some episodes of short-term memory loss but reports managing medications well. Agreed to keep team update of concerns r/t medication management or prescription cost. Provided information regarding established blood pressure parameters along with indications for notifying a provider. Reports not monitoring consistently. Advised to try and monitor daily and record readings to identify trends. Reviewed symptoms. Denies chest pain or palpitations. Denies headaches, dizziness, or visual changes.  Discussed compliance with recommended cardiac prudent diet. Encouraged to read nutrition labels, continue monitoring sodium intake, and avoid highly processed foods when possible. Reports doing "ok" with meals. Primary concern is meal choices. Discussed possible benefits of grocery delivery and  options for meal delivery. Agreed to  update with decision. Will submit additional referrals if needed. Reviewed s/sx of heart attack, stroke and worsening symptoms that require immediate medical attention. Update 04/07/22: Patient requested assistance with obtaining a Medical Alert device. Currently insured with [employer]. Per [facility] representative certain devices are available as an insurance benefit. Patient agreed to follow up to determine if she qualifies with her current insurance plan.  Will follow up next week and assist with conference call to her insurance provider if needed.    BP Readings from Last 3 Encounters:  03/26/22 (!) 145/69  10/19/21 138/78  09/14/21 (!) 150/86    Symptom Management: Take all medications as prescribed Attend all scheduled provider appointments Call pharmacy for medication refills 3-7 days in advance of running out of medications Check blood pressure  Keep a blood pressure log Call doctor for signs and symptoms of high blood pressure Read nutrition labels. Eat whole grains, fruits and vegetables, lean meats and healthy fats Limit salt intake  Call provider office for new concerns or questions    Follow Up Plan:  Will follow up next week             PLAN: Will follow up as scheduled on 04/14/22.   Katha Cabal RN Care Manager/Chronic Care Management [phone]

## 2022-04-09 ENCOUNTER — Ambulatory Visit
Admission: RE | Admit: 2022-04-09 | Discharge: 2022-04-09 | Disposition: A | Discharge status: Home / Self Care | Payer: Medicare Other | Source: Ambulatory Visit | Attending: Internal Medicine | Admitting: Internal Medicine

## 2022-04-09 DIAGNOSIS — Z1231 Encounter for screening mammogram for malignant neoplasm of breast: Secondary | ICD-10-CM

## 2022-04-12 ENCOUNTER — Encounter: Payer: Self-pay | Admitting: *Deleted

## 2022-04-13 ENCOUNTER — Ambulatory Visit (INDEPENDENT_AMBULATORY_CARE_PROVIDER_SITE_OTHER): Payer: Medicare Other | Admitting: Internal Medicine

## 2022-04-13 ENCOUNTER — Encounter: Payer: Self-pay | Admitting: Internal Medicine

## 2022-04-13 ENCOUNTER — Telehealth: Payer: Self-pay | Admitting: *Deleted

## 2022-04-13 VITALS — BP 134/77 | HR 73 | Temp 98.2°F | Ht 60.0 in | Wt 202.5 lb

## 2022-04-13 DIAGNOSIS — E119 Type 2 diabetes mellitus without complications: Secondary | ICD-10-CM

## 2022-04-13 DIAGNOSIS — G2581 Restless legs syndrome: Secondary | ICD-10-CM

## 2022-04-13 DIAGNOSIS — Z23 Encounter for immunization: Secondary | ICD-10-CM

## 2022-04-13 DIAGNOSIS — Z0000 Encounter for general adult medical examination without abnormal findings: Secondary | ICD-10-CM | POA: Diagnosis not present

## 2022-04-13 DIAGNOSIS — D649 Anemia, unspecified: Secondary | ICD-10-CM | POA: Diagnosis not present

## 2022-04-13 DIAGNOSIS — E785 Hyperlipidemia, unspecified: Secondary | ICD-10-CM | POA: Diagnosis not present

## 2022-04-13 DIAGNOSIS — I10 Essential (primary) hypertension: Secondary | ICD-10-CM | POA: Diagnosis not present

## 2022-04-13 DIAGNOSIS — Z13820 Encounter for screening for osteoporosis: Secondary | ICD-10-CM

## 2022-04-13 DIAGNOSIS — M48062 Spinal stenosis, lumbar region with neurogenic claudication: Secondary | ICD-10-CM

## 2022-04-13 DIAGNOSIS — E039 Hypothyroidism, unspecified: Secondary | ICD-10-CM | POA: Diagnosis not present

## 2022-04-13 LAB — CBC WITH DIFFERENTIAL/PLATELET
Basophils Absolute: 0 10*3/uL (ref 0.0–0.1)
Basophils Relative: 0.5 % (ref 0.0–3.0)
Eosinophils Absolute: 0.5 10*3/uL (ref 0.0–0.7)
Eosinophils Relative: 8.5 % — ABNORMAL HIGH (ref 0.0–5.0)
HCT: 38.7 % (ref 36.0–46.0)
Hemoglobin: 12.9 g/dL (ref 12.0–15.0)
Lymphocytes Relative: 28.6 % (ref 12.0–46.0)
Lymphs Abs: 1.7 10*3/uL (ref 0.7–4.0)
MCHC: 33.3 g/dL (ref 30.0–36.0)
MCV: 85.6 fl (ref 78.0–100.0)
Monocytes Absolute: 0.4 10*3/uL (ref 0.1–1.0)
Monocytes Relative: 6.4 % (ref 3.0–12.0)
Neutro Abs: 3.3 10*3/uL (ref 1.4–7.7)
Neutrophils Relative %: 56 % (ref 43.0–77.0)
Platelets: 167 10*3/uL (ref 150.0–400.0)
RBC: 4.52 Mil/uL (ref 3.87–5.11)
RDW: 13.5 % (ref 11.5–15.5)
WBC: 5.9 10*3/uL (ref 4.0–10.5)

## 2022-04-13 LAB — COMPREHENSIVE METABOLIC PANEL
ALT: 16 U/L (ref 0–35)
AST: 17 U/L (ref 0–37)
Albumin: 4.1 g/dL (ref 3.5–5.2)
Alkaline Phosphatase: 66 U/L (ref 39–117)
BUN: 17 mg/dL (ref 6–23)
CO2: 28 mEq/L (ref 19–32)
Calcium: 9.9 mg/dL (ref 8.4–10.5)
Chloride: 99 mEq/L (ref 96–112)
Creatinine, Ser: 0.92 mg/dL (ref 0.40–1.20)
GFR: 60.65 mL/min (ref >60.00)
Glucose, Bld: 85 mg/dL (ref 70–99)
Potassium: 4.6 mEq/L (ref 3.5–5.1)
Sodium: 138 mEq/L (ref 135–145)
Total Bilirubin: 0.5 mg/dL (ref 0.2–1.2)
Total Protein: 6.8 g/dL (ref 6.0–8.3)

## 2022-04-13 LAB — LIPID PANEL
Cholesterol: 165 mg/dL (ref 0–200)
HDL: 60.1 mg/dL (ref >39.00)
NonHDL: 105.01
Total CHOL/HDL Ratio: 3
Triglycerides: 221 mg/dL — ABNORMAL HIGH (ref 0.0–149.0)
VLDL: 44.2 mg/dL — ABNORMAL HIGH (ref 0.0–40.0)

## 2022-04-13 LAB — LDL CHOLESTEROL, DIRECT: Direct LDL: 73 mg/dL

## 2022-04-13 LAB — HEMOGLOBIN A1C: Hgb A1c MFr Bld: 6.1 % (ref 4.6–6.5)

## 2022-04-13 LAB — MICROALBUMIN / CREATININE URINE RATIO
Creatinine,U: 33.1 mg/dL
Microalb Creat Ratio: 2.1 mg/g (ref 0.0–30.0)
Microalb, Ur: <0.7 mg/dL (ref 0.0–1.9)

## 2022-04-13 LAB — TSH: TSH: 0.62 u[IU]/mL (ref 0.35–5.50)

## 2022-04-13 LAB — VITAMIN D 25 HYDROXY (VIT D DEFICIENCY, FRACTURES): VITD: 32.06 ng/mL (ref 30.00–100.00)

## 2022-04-13 LAB — IBC + FERRITIN
Ferritin: 58.2 ng/mL (ref 10.0–291.0)
Iron: 86 ug/dL (ref 42–145)
Saturation Ratios: 25.2 % (ref 20.0–50.0)
TIBC: 341.6 ug/dL (ref 250.0–450.0)
Transferrin: 244 mg/dL (ref 212.0–360.0)

## 2022-04-13 LAB — VITAMIN B12: Vitamin B-12: 763 pg/mL (ref 211–911)

## 2022-04-13 MED ORDER — HYDROCODONE-ACETAMINOPHEN 7.5-325 MG PO TABS: 1.0000 | ORAL_TABLET | Freq: Four times a day (QID) | ORAL | 0 refills | Status: DC | PRN

## 2022-04-13 NOTE — Telephone Encounter (Signed)
Patient would like to know if she is a "good candidate for Ozempic"? Okay to respond via MyChart

## 2022-04-13 NOTE — Progress Notes (Signed)
Established Patient Office Visit     CC/Reason for Visit: Annual preventive exam, subsequent Medicare wellness visit, medication refills  HPI: Katie Booker is a 77 y.o. female who is coming in today for the above mentioned reasons. Past Medical History is significant for: Spinal stenosis of cervical, thoracic, lumbar spine followed by Dr. Ellene Route and on chronic hydrocodone with the pain contract through me, anxiety on Xanax, insomnia on trazodone, diabetes, hypertension, hyperlipidemia, hypothyroidism.  She also has a history of restless leg syndrome and has started seeing neurology for this.  She has a uterine prolapse and has an upcoming appointment with GYN.  She just started right eye injections for her macular degeneration.  She suffered a left Achilles tendon tear and surgery is apparently not an option.  She has routine eye care, is overdue for dental care.  She is overdue for flu, COVID and RSV vaccines.  She is overdue for bone density.  She had her mammogram last week.   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   Depression    DOE (dyspnea on exertion)    GERD    Headache    Hemorrhoids    History of basal cell carcinoma excision    left cheek   History of colon polyps    History of hyperthyroidism 2011   RAI treatement   History of panic attacks    Hyperlipidemia    Hypertension    Hypothyroidism, postradioiodine therapy    followed by pcp   IDA (iron deficiency anemia)    intermittant   Insomnia    OA (osteoarthritis)    OSA (obstructive sleep apnea)    no cpap, per pt tried unable to tolerate   Peripheral neuropathy    legs and feet from hx back surgery's   Rash     bilateral axilla area -- per pt due to some personal wipes used other the counter   RLS (restless legs syndrome)    Spondylolisthesis at L1-L2 level    Tingling of both upper extremities    left greater than right due to cervical pinched nerve   Type 2 diabetes mellitus ([place])    followed by pcp   Umbilical hernia    Urinary frequency    Urinary urgency     Past Surgical History:  Procedure Laterality Date   ANTERIOR CERVICAL DECOMP/DISCECTOMY FUSION N/A 09/02/2014   Procedure: Cervical five- six  Anterior cervical decompression fusion;  Surgeon: Kristeen Miss, MD;  Location: [facility];  Service: Neurosurgery;  Laterality: N/A;  C5-6 Anterior cervical decompression/diskectomy/fusion   BREAST BIOPSY Right 10/2018   x 2 benign   CARPAL TUNNEL RELEASE Right 2016   CATARACT EXTRACTION W/ INTRAOCULAR LENS  IMPLANT, BILATERAL  03/2015   COLONOSCOPY  2007   ESOPHAGOGASTRODUODENOSCOPY  2007   HEMORRHOID SURGERY  03/23/2011   Procedure: HEMORRHOIDECTOMY;  Surgeon: Joyice Faster. Cornett, MD;  Location: [facility];  Service: General;  Laterality: N/A;  lateral internal sphincterotomy and hemorrhoidectomy   LUMBAR FUSION  02/2015   L1 -- 2   PARTIAL KNEE ARTHROPLASTY Left 12/27/2017   Procedure: UNICOMPARTMENTAL LEFT  KNEE;  Surgeon: Marchia Bond, MD;  Location: [facility];  Service: Orthopedics;  Laterality: Left;   THORACIC DISCECTOMY N/A 12/15/2012   Procedure: Thoracic ten-eleven Thoracic laminectomy;  Surgeon: Kristeen Miss, MD;  Location: [facility];  Service: Neurosurgery;  Laterality: N/A;  Thoracic ten-eleven Thoracic laminectomy   TUBAL LIGATION Bilateral 1991    Social History:  reports that she quit smoking about 18 years ago. Her smoking use included cigarettes. She has a 20.00 pack-year smoking history. She has never used smokeless tobacco. She reports current alcohol use. She reports that she does not use drugs.  Allergies: Allergies  Allergen Reactions   Duraprep  [Antiseptic Products, Misc.] Itching and Rash    Irritation everywhere prep was used   Advil Allergy Sinus [Chlorpheniramine-Pse-Ibuprofen]     Nervousness    Betadine [Povidone Iodine] Other (See Comments)    Burning sensation.    Hydromorphone Hcl Other (See Comments)    Makes crazy   Morphine And Related Other (See Comments)    hallucinations    Family History:  Family History  Problem Relation Age of Onset   Diabetes Father    COPD Father    Cancer Maternal Grandfather        stomach     Current Outpatient Medications:    ALPRAZolam (XANAX) 0.5 MG tablet, Take 1 tablet (0.5 mg total) by mouth 3 (three) times daily as needed. for anxiety, Disp: 90 tablet, Rfl: 1   atenolol (TENORMIN) 25 MG tablet, TAKE 1 TABLET BY MOUTH DAILY, Disp: 100 tablet, Rfl: 2   atorvastatin (LIPITOR) 40 MG tablet, TAKE 1 TABLET BY MOUTH DAILY, Disp: 100 tablet, Rfl: 2   calcium citrate (CALCITRATE - DOSED IN MG ELEMENTAL CALCIUM) 950 (200 Ca) MG tablet, Take 200 mg of elemental calcium by mouth daily., Disp: , Rfl:    Carboxymethylcell-Hypromellose (GENTEAL OP), Apply to eye., Disp: , Rfl:    Carboxymethylcellulose Sodium (LUBRICANT EYE DROPS OP), Apply 2 drops to eye 4 (four) times daily as needed (dry eyes). , Disp: , Rfl:    citalopram (CELEXA) 40 MG tablet, TAKE 1 TABLET BY MOUTH DAILY, Disp: 90 tablet, Rfl: 0   estradiol (ESTRACE) 0.1 MG/GM vaginal cream, PLACE 1 APPLICATORFUL  VAGINALLY 3 TIMES A WEEK., Disp: 170 g, Rfl: 3   hydrocortisone 2.5 % cream, APPLY TO AFFECTED AREA(S)  TOPICALLY TWICE DAILY, Disp: 85.05 g, Rfl: 1   Lancets (ONETOUCH ULTRASOFT) lancets, Use daily, Disp: 100 each, Rfl: 6   levothyroxine (SYNTHROID) 125 MCG tablet, Take 1 tablet (125 mcg total) by mouth daily., Disp: 100 tablet, Rfl: 1   lisinopril (ZESTRIL) 10 MG tablet, Take 1 tablet (10 mg total) by mouth daily., Disp: 90 tablet, Rfl: 0   meloxicam (MOBIC) 15 MG tablet, TAKE 1 TABLET BY MOUTH DAILY, Disp: 100 tablet,  Rfl: 2   metFORMIN (GLUCOPHAGE) 1000 MG tablet, TAKE 1 TABLET BY MOUTH  TWICE DAILY WITH MEALS, Disp: 200 tablet, Rfl: 2   Misc Natural Products (TURMERIC CURCUMIN) CAPS, Take by mouth., Disp: , Rfl:    Misc. Devices (BARIATRIC ROLLATOR) MISC, 1 Rollator with basket., Disp: 1 each, Rfl: 0   Multiple Vitamin (MULTIVITAMIN) tablet, Take 1 tablet by mouth daily., Disp: , Rfl:    Omega-3 Fatty Acids (FISH OIL) 1200 MG CAPS, Take 1 capsule by mouth daily. , Disp: , Rfl:    ONE TOUCH ULTRA TEST test strip, USE 1 DAILY AS DIRECTED, Disp: 100 each, Rfl: 3   pantoprazole (PROTONIX) 40 MG tablet,  TAKE 1 TABLET BY MOUTH TWICE  DAILY BEFORE MEALS, Disp: 200 tablet, Rfl: 1   pioglitazone (ACTOS) 15 MG tablet, TAKE 1 TABLET BY MOUTH DAILY, Disp: 100 tablet, Rfl: 0   pramipexole (MIRAPEX) 0.125 MG tablet, Take 1 tablet (0.125 mg total) by mouth at bedtime., Disp: 30 tablet, Rfl: 2   pregabalin (LYRICA) 75 MG capsule, Take 1 capsule (75 mg total) by mouth 2 (two) times daily., Disp: 180 capsule, Rfl: 0   tiZANidine (ZANAFLEX) 4 MG tablet, TAKE 1 TABLET BY MOUTH 3 TIMES  DAILY AS NEEDED, Disp: 180 tablet, Rfl: 0   tobramycin (TOBREX) 0.3 % ophthalmic solution, Place 1 drop into both eyes every 6 (six) hours. Day before injection, day of injection and day after injection, Disp: , Rfl:    traZODone (DESYREL) 100 MG tablet, TAKE 1 TO 2 TABLETS BY MOUTH AT  BEDTIME, Disp: 180 tablet, Rfl: 3   Vitamin D, Ergocalciferol, (DRISDOL) 1.25 MG (50000 UNIT) CAPS capsule, TAKE 1 CAPSULE BY MOUTH  EVERY 7 DAYS FOR 12 DOSES, Disp: 12 capsule, Rfl: 3   HYDROcodone-acetaminophen (NORCO) 7.5-325 MG tablet, Take 1 tablet by mouth every 6 (six) hours as needed for moderate pain., Disp: 120 tablet, Rfl: 0   HYDROcodone-acetaminophen (NORCO) 7.5-325 MG tablet, Take 1 tablet by mouth every 6 (six) hours as needed for moderate pain., Disp: 120 tablet, Rfl: 0   HYDROcodone-acetaminophen (NORCO) 7.5-325 MG tablet, Take 1 tablet by mouth  every 6 (six) hours as needed for moderate pain., Disp: 120 tablet, Rfl: 0  Review of Systems:  Negative unless indicated in HPI.   Physical Exam: Vitals:   04/13/22 0956  BP: 134/77  Pulse: 73  Temp: 98.2 F (36.8 C)  TempSrc: Oral  SpO2: 94%  Weight: 202 lb 8 oz (91.9 kg)  Height: 5' (1.524 m)    Body mass index is 39.55 kg/m.   Physical Exam Vitals reviewed.  Constitutional:      General: She is not in acute distress.    Appearance: Normal appearance. She is not ill-appearing, toxic-appearing or diaphoretic.  HENT:     Head: Normocephalic.     Right Ear: Tympanic membrane, ear canal and external ear normal. There is no impacted cerumen.     Left Ear: Tympanic membrane, ear canal and external ear normal. There is no impacted cerumen.     Nose: Nose normal.     Mouth/Throat:     Mouth: Mucous membranes are moist.     Pharynx: Oropharynx is clear. No oropharyngeal exudate or posterior oropharyngeal erythema.  Eyes:     General: No scleral icterus.       Right eye: No discharge.        Left eye: No discharge.     Conjunctiva/sclera: Conjunctivae normal.     Pupils: Pupils are equal, round, and reactive to light.  Neck:     Vascular: No carotid bruit.  Cardiovascular:     Rate and Rhythm: Normal rate and regular rhythm.     Pulses: Normal pulses.     Heart sounds: Normal heart sounds.  Pulmonary:     Effort: Pulmonary effort is normal. No respiratory distress.     Breath sounds: Normal breath sounds.  Abdominal:     General: Abdomen is flat. Bowel sounds are normal.     Palpations: Abdomen is soft.  Musculoskeletal:        General: Normal range of motion.     Cervical back: Normal range of motion.  Skin:  General: Skin is warm and dry.     Capillary Refill: Capillary refill takes less than 2 seconds.  Neurological:     General: No focal deficit present.     Mental Status: She is alert and oriented to person, place, and time. Mental status is at baseline.   Psychiatric:        Mood and Affect: Mood normal.        Behavior: Behavior normal.        Thought Content: Thought content normal.        Judgment: Judgment normal.     Diabetic Foot Exam - Simple   Simple Foot Form Diabetic Foot exam was performed with the following findings: Yes 04/13/2022 10:29 AM  Visual Inspection See comments: Yes Sensation Testing Intact to touch and monofilament testing bilaterally: Yes Pulse Check Posterior Tibialis and Dorsalis pulse intact bilaterally: Yes Comments Left toenail onychomycosis. Has a ruptured left Achilles tendon.      Subsequent Medicare wellness visit   1. Risk factors, based on past  M,S,F - Cardiac Risk Factors include: advanced age (>35mn, >>23women);diabetes mellitus;hypertension   2.  Physical activities: Dietary issues and exercise activities discussed:  Current Exercise Habits: The patient does not participate in regular exercise at present, Exercise limited by: orthopedic condition(s)   3.  Depression/mood:  FNeihartOffice Visit [facility]        4.  ADL's:    04/13/2022    9:54 AM 04/12/2022   10:29 PM  In your present state of health, do you have any difficulty performing the following activities:  Hearing? 0 0  Vision? 1 0  Difficulty concentrating or making decisions? 1 1  Walking or climbing stairs? 1 1  Dressing or bathing? 1 0  Doing errands, shopping? 1 1  Preparing Food and eating ? N N  Using the Toilet? N N  In the past six months, have you accidently leaked urine? Y Y  Do you have problems with loss of bowel control? Y Y  Managing your Medications? N N  Managing your Finances? N Y  Housekeeping or managing your Housekeeping? N Y     5.  Fall risk:     09/08/2021    9:04 AM 09/10/2021   10:22 AM 04/01/2022    2:27 PM 04/12/2022   10:29 PM 04/13/2022   10:51 AM  Fall Risk  Falls in the past year? 1 1 1 1 1  $ Was  there an injury with Fall? 0 0 0 1 1  Fall Risk Category Calculator 2 2 2 3 3  $ Fall Risk Category (Retired) Moderate Moderate     (RETIRED) Patient Fall Risk Level  Moderate fall risk     Patient at Risk for Falls Due to  Impaired balance/gait History of fall(s);Medication side effect;Impaired balance/gait  Impaired balance/gait  Fall risk Follow up  Falls evaluation completed Falls prevention discussed  Falls evaluation completed     6.  Home safety: No problems identified   7.  Height weight, and visual acuity: height and weight as above, vision/hearing: Vision is 20/40 with each eye independently and together   8.  Counseling: Counseling given: Not Answered Tobacco comments: quit 2007    9. Lab orders based on risk factors: Laboratory update will be reviewed   10. Cognitive assessment:        04/13/2022   10:00 AM  6CIT Screen  What  Year? 0 points  What month? 0 points  What time? 0 points  Count back from 20 0 points  Months in reverse 0 points  Repeat phrase 0 points  Total Score 0 points     11. Screening: Patient provided with a written and personalized 5-10 year screening schedule in the AVS. Health Maintenance  Topic Date Due   Yearly kidney health urinalysis for diabetes  04/19/2017   Yearly kidney function blood test for diabetes  02/05/2022   Hemoglobin A1C  03/13/2022   COVID-19 Vaccine (5 - 2023-24 season) 04/29/2022*   Eye exam for diabetics  07/24/2022   Complete foot exam   04/14/2023   Medicare Annual Wellness Visit  04/14/2023   DTaP/Tdap/Td vaccine (2 - Td or Tdap) 07/18/2023   Pneumonia Vaccine  Completed   Flu Shot  Completed   DEXA scan (bone density measurement)  Completed   Hepatitis C Screening: USPSTF Recommendation to screen - Ages 73-79 yo.  Completed   Zoster (Shingles) Vaccine  Completed   HPV Vaccine  Aged Out  *Topic was postponed. The date shown is not the original due date.    12. Provider List Update: Patient Care Team     Relationship Specialty Notifications Start End  Isaac Bliss, Rayford Halsted, MD PCP - General Internal Medicine  01/13/18   Viona Gilmore, [facility] (Inactive) Pharmacist Pharmacist  02/08/20    Comment: Phone: [phone]     13. Advance Directives: Does Patient Have a Medical Advance Directive?: No Would patient like information on creating a medical advance directive?: No - Patient declined  14. Opioids: Patient is on chronic opioids. She has a signed pain contract with me. Has not displayed any signs of an opioid-use disorder.   15.   Goals      Goal: CCM (Diabetes) Expected Outcome:  Monitor, Self-Manage And Reduce Symptoms of Diabetes     Current Barriers:  Chronic Disease Management support and education needs related to Diabetes  Planned Interventions:     Lab Results  Component Value Date   HGBA1C 5.9 (A) 09/10/2021    Symptom Management:   Follow Up Plan:        Goal: CCM (Hypertension) Expected Outcome:  Monitor, Self-Manage And Reduce Symptoms of Hypertension     Reports short term memory loss Saw neurology recently for restless legs15 to 20 bad episodes/general "shakes" Reports a significant improvement with symptoms since starting Mirapex. Several Falls Reports it starts with anxiety. [facility], Evert Kohl. Plans to do face to face visits. Initial visit is scheduled in March. Dr. Jerilee Hoh mentioned someone was at [facility], she declined services with that person  Son and daughter in law pay for all [employer] food and care. Son and daughter helping financially Son will find someone to help with housework. Provide list of agencies. PROVIDE LIST FOR FAMILY!!  Considering move to [place] to be closer to son and daughter in law  Daughter in the area but not ready to retire. Spouse is 62. May not be able to help as much financially.  Lives alone. Has no one to stay with her and unable to have surgery Has a boot.  Eligible  for Medical Alert device via  Health Plan. Will contact to obtain. Report putting it off in the past. May need to schedule conference call.  Has wet macular degeneration, sometimes vision is declined immediately after injection   Was previously going to PT d/tpartially ruptured achilles tendon/pt was discontinued per ortho request Has handicap  spot in the front, on the first level of her condo.  Needs help d/t physical condition Needs help with housework  Difficulty doing some errands d/t balance DO HOMEWORK for grocery delivery. [employer] and [facility] closer Include info for companion care. Some are available via department of SS  More trouble keeping up with things.  Dm not checking.  Htn Friend/neighbor accompanied her to md visit Walk slowly with walker and dim lights, seem to help anxiety and restless legs, Would put tablet on rollator and listen to relaxing music as she walked. Also found it helpful to distract herself Neuro thinks episodes were panic attacks  Send additional information regarding nutritional intake. Mom's Meals Send as well  Info regarding heart attack and stroke  Update 04/07/22: [place] today(pt) thinks Louis is normal POC Unable to reach him today. Unable to attend gyn appt as scheduled d/t not feeling well. Has prolapsed .. Dr. Sabra Heck. Rescheduled for 07/22/22 Reports terrible case of white coat syndrome/all work has been in medical/dental area/still fearful during appts May need to change prescription d/t shortage of Lyrica. Received stat order but only able to receive 10 tablets from the pharmacy.     RESTLESS LEG SYNDROME (diabetic neuropathy, lumbar spinal stenosis / radiculopathy) - continue pramipexole, lyrica, hydrocodone - could increase pramipexole to 0.156m twice a day - check B12 and iron studies at next PCP visit   Current Barriers:  Chronic Disease Management support and education needs related to Hypertension  Planned Interventions:    BP  Readings from Last 3 Encounters:  03/26/22 (!) 145/69  10/19/21 138/78  09/14/21 (!) 150/86    Symptom Management:      Follow Up Plan:        weight loss     Evidence-based guidance:   Assess readiness for transition by reviewing adolescent and caregiver goals for transition and adolescent's self-management skills.  Consider available support, presence of risk-taking behaviors, social stressors and medical and/or psychologic comorbidity.  Acknowledge and normalize fear of change, loss of relationship with pediatric provider, change in parental role in health care.   Assist patient to accept responsibility for transition by encouraging effective communication skills, decision-making, self-advocacy and self-care.  Establish a structured approach to transition by beginning in early adolescence or at least 1 year prior to planned transition during late adolescence or early adulthood; consider developmental readiness.  Discuss readiness to transition annually with providers, patient, parent or caregiver; consider development of an individualized written transition plan.  Provide anticipatory guidance regarding differences and similarities between pediatric and adult care.  Present transition as a normal, positive event and an example of letting go of childhood.  Provide emotional support to both child and parent or caregiver to cope with life changes, stressors, disengagement of parent or caregiver from care decisions.  Transfer responsibilities for care to adolescent gradually.  Arrange a joint visit with pediatric and adult provider when able.  Provide monthly telephone follow-up after transition.  Develop a reproductive health plan; address contraception and refer for preconception counseling.  Assist patient to accept responsibility for transition.    Notes:          I have personally reviewed and noted the following in the patient's chart:   Medical and social history Use of  alcohol, tobacco or illicit drugs  Current medications and supplements Functional ability and status Nutritional status Physical activity Advanced directives List of other physicians Hospitalizations, surgeries, and ER visits in previous 12 months Vitals Screenings to include  cognitive, depression, and falls Referrals and appointments  In addition, I have reviewed and discussed with patient certain preventive protocols, quality metrics, and best practice recommendations. A written personalized care plan for preventive services as well as general preventive health recommendations were provided to patient.  Impression and Plan:  Medicare annual wellness visit, subsequent  Diabetes mellitus without complication ([place]) - Plan: CBC with Differential/Platelet, Comprehensive metabolic panel, Hemoglobin A1c, Urine microalbumin-creatinine with uACR, Urine microalbumin-creatinine with uACR, Hemoglobin A1c, Comprehensive metabolic panel, CBC with Differential/Platelet  Dyslipidemia - Plan: Lipid panel, Lipid panel  Essential hypertension  Acquired hypothyroidism - Plan: TSH, TSH  Anemia, unspecified type  Spinal stenosis, lumbar region, with neurogenic claudication - Plan: Vitamin B12, Vitamin D, 25-hydroxy, HYDROcodone-acetaminophen (NORCO) 7.5-325 MG tablet, HYDROcodone-acetaminophen (NORCO) 7.5-325 MG tablet, HYDROcodone-acetaminophen (NORCO) 7.5-325 MG tablet, Vitamin B12  Restless legs - Plan: IBC + Ferritin, IBC + Ferritin  Immunization due  Needs flu shot - Plan: Flu Vaccine QUAD High Dose(Fluad)  Encounter for screening for osteoporosis - Plan: DG Bone Density  -Recommend routine eye and dental care. -Healthy lifestyle discussed in detail. -Labs to be updated today. -Prostate cancer screening: Not applicable Health Maintenance  Topic Date Due   Yearly kidney health urinalysis for diabetes  04/19/2017   Yearly kidney function blood test for diabetes  02/05/2022   Hemoglobin  A1C  03/13/2022   COVID-19 Vaccine (5 - 2023-24 season) 04/29/2022*   Eye exam for diabetics  07/24/2022   Complete foot exam   04/14/2023   Medicare Annual Wellness Visit  04/14/2023   DTaP/Tdap/Td vaccine (2 - Td or Tdap) 07/18/2023   Pneumonia Vaccine  Completed   Flu Shot  Completed   DEXA scan (bone density measurement)  Completed   Hepatitis C Screening: USPSTF Recommendation to screen - Ages 72-79 yo.  Completed   Zoster (Shingles) Vaccine  Completed   HPV Vaccine  Aged Out  *Topic was postponed. The date shown is not the original due date.     -PDMP reviewed, no red flags, overdose risk score is 220. -Refill hydrocodone 7.5 mg to take 1 tablet every 6 hours as needed for pain for total of 120 tablets a month x 3 months. -Flu vaccine administered today.     Lelon Frohlich, MD [facility]

## 2022-04-14 ENCOUNTER — Ambulatory Visit: Payer: Medicare Other

## 2022-04-14 DIAGNOSIS — I10 Essential (primary) hypertension: Secondary | ICD-10-CM

## 2022-04-22 DIAGNOSIS — E1159 Type 2 diabetes mellitus with other circulatory complications: Secondary | ICD-10-CM

## 2022-04-22 DIAGNOSIS — I10 Essential (primary) hypertension: Secondary | ICD-10-CM | POA: Diagnosis not present

## 2022-04-27 ENCOUNTER — Encounter: Payer: Self-pay | Admitting: *Deleted

## 2022-04-27 ENCOUNTER — Other Ambulatory Visit: Payer: Self-pay | Admitting: *Deleted

## 2022-04-27 DIAGNOSIS — E785 Hyperlipidemia, unspecified: Secondary | ICD-10-CM

## 2022-05-04 ENCOUNTER — Telehealth: Payer: Self-pay

## 2022-05-04 NOTE — Progress Notes (Signed)
Patient ID: Katie Booker, female   DOB: 1945/11/19, 77 y.o.   MRN: [id number]  Care Management & Coordination Services Pharmacy Team  Reason for Encounter: Hypertension  Contacted patient to discuss hypertension disease state. Spoke with patient on 05/14/2022    Current antihypertensive regimen:  Atenolol 25 mg 1 tablet daily (in AM)  Lisinopril 10 mg 1 tablet daily (in AM)  Patient verbally confirms she is taking the above medications as directed. Yes  How often are you checking your Blood Pressure? infrequently  Patient reports she does not check her blood pressures herself at home she denies any hyper/hypotensive symptoms. Patient reports she rarely uses or adds salt when cooking and eating. She is trying to be mindful of her meal choices but still thinks she may be ingesting included sodium.  Current home BP readings: Patient reports her last mindpath therapy visit reading of 130/80 on 05/05/22  Any readings above 180/100? No If yes any symptoms of hypertensive emergency? patient denies any symptoms of high blood pressure  What recent interventions/DTPs have been made by any provider to improve Blood Pressure control since last CPP Visit: Patient reports none  Any recent hospitalizations or ED visits since last visit with CPP? No  What exercise is being done to improve your Blood Pressure Control?  Patient reports her shins and ankles have a bit of swelling, states that the ankles do go down by the next morning but the shins not so much, she states she has had a partially ruptured tendon in one leg. Patient also reports she had an episode of restless leg syndrome the night before last but thinks she may have missed her dose of Mirapex as she found one on the floor yesterday. Patient is in agreement with Pharmacist follow up in May.  Adherence Review: Is the patient currently on ACE/ARB medication? Yes Does the patient have >5 day gap between last estimated fill dates?  No  Star Rating Drugs:  Atorvastatin 40 mg - last filled 04/05/22 100 DS at [facility] Lisinopril 10 mg  - last filled 03/29/22 90 DS at [facility] Metformin 1000 mg - last filled 03/08/22 100 DS at [facility] Pioglitazone 15 mg  - last filled 04/14/22 100 DS at [facility]   Chart Updates: Recent office visits:  04/14/22 Neldon Labella, RN - Patient presented for [facility] Nurse Visit.  04/13/22  Isaac Bliss, Rayford Halsted, MD - Patient presented for Medicare Annual Wellness visit and other concerns. No medication changes.   Recent consult visits:  03/26/22 Penni Bombard, MD (Neurology) - Patient presented for Restless leg syndrome and other concerns. OK to Increase Pramipexole.   Hospital visits:  None in previous 6 months  Medications: Outpatient Encounter Medications as of 05/04/2022  Medication Sig   ALPRAZolam (XANAX) 0.5 MG tablet Take 1 tablet (0.5 mg total) by mouth 3 (three) times daily as needed. for anxiety   atenolol (TENORMIN) 25 MG tablet TAKE 1 TABLET BY MOUTH DAILY   atorvastatin (LIPITOR) 40 MG tablet TAKE 1 TABLET BY MOUTH DAILY   calcium citrate (CALCITRATE - DOSED IN MG ELEMENTAL CALCIUM) 950 (200 Ca) MG tablet Take 200 mg of elemental calcium by mouth daily.   Carboxymethylcell-Hypromellose (GENTEAL OP) Apply to eye.   Carboxymethylcellulose Sodium (LUBRICANT EYE DROPS OP) Apply 2 drops to eye 4 (four) times daily as needed (dry eyes).    citalopram (CELEXA) 40 MG tablet TAKE 1 TABLET BY MOUTH DAILY   estradiol (ESTRACE) 0.1 MG/GM vaginal cream PLACE 1 APPLICATORFUL  VAGINALLY 3 TIMES A WEEK.   HYDROcodone-acetaminophen (NORCO) 7.5-325 MG tablet Take 1 tablet by mouth every 6 (six) hours as needed for moderate pain.   HYDROcodone-acetaminophen (NORCO) 7.5-325 MG tablet Take 1 tablet by mouth every 6 (six) hours as needed for moderate pain.   HYDROcodone-acetaminophen (NORCO) 7.5-325 MG tablet Take 1 tablet by mouth every 6 (six) hours as needed for moderate pain.   hydrocortisone 2.5 %  cream APPLY TO AFFECTED AREA(S)  TOPICALLY TWICE DAILY   Lancets (ONETOUCH ULTRASOFT) lancets Use daily   levothyroxine (SYNTHROID) 125 MCG tablet Take 1 tablet (125 mcg total) by mouth daily.   lisinopril (ZESTRIL) 10 MG tablet Take 1 tablet (10 mg total) by mouth daily.   meloxicam (MOBIC) 15 MG tablet TAKE 1 TABLET BY MOUTH DAILY   metFORMIN (GLUCOPHAGE) 1000 MG tablet TAKE 1 TABLET BY MOUTH  TWICE DAILY WITH MEALS   Misc Natural Products (TURMERIC CURCUMIN) CAPS Take by mouth.   Misc. Devices (BARIATRIC ROLLATOR) MISC 1 Rollator with basket.   Multiple Vitamin (MULTIVITAMIN) tablet Take 1 tablet by mouth daily.   Omega-3 Fatty Acids (FISH OIL) 1200 MG CAPS Take 1 capsule by mouth daily.    ONE TOUCH ULTRA TEST test strip USE 1 DAILY AS DIRECTED   pantoprazole (PROTONIX) 40 MG tablet TAKE 1 TABLET BY MOUTH TWICE  DAILY BEFORE MEALS   pioglitazone (ACTOS) 15 MG tablet TAKE 1 TABLET BY MOUTH DAILY   pramipexole (MIRAPEX) 0.125 MG tablet Take 1 tablet (0.125 mg total) by mouth at bedtime.   pregabalin (LYRICA) 75 MG capsule Take 1 capsule (75 mg total) by mouth 2 (two) times daily.   tiZANidine (ZANAFLEX) 4 MG tablet TAKE 1 TABLET BY MOUTH 3 TIMES  DAILY AS NEEDED   tobramycin (TOBREX) 0.3 % ophthalmic solution Place 1 drop into both eyes every 6 (six) hours. Day before injection, day of injection and day after injection   traZODone (DESYREL) 100 MG tablet TAKE 1 TO 2 TABLETS BY MOUTH AT  BEDTIME   Vitamin D, Ergocalciferol, (DRISDOL) 1.25 MG (50000 UNIT) CAPS capsule TAKE 1 CAPSULE BY MOUTH  EVERY 7 DAYS FOR 12 DOSES   No facility-administered encounter medications on file as of 05/04/2022.    Recent Office Vitals: BP Readings from Last 3 Encounters:  04/13/22 134/77  03/26/22 (!) 145/69  10/19/21 138/78   Pulse Readings from Last 3 Encounters:  04/13/22 73  03/26/22 66  10/19/21 77    Wt Readings from Last 3 Encounters:  04/13/22 202 lb 8 oz (91.9 kg)  03/26/22 195 lb (88.5 kg)   10/19/21 195 lb 3.2 oz (88.5 kg)     Kidney Function Lab Results  Component Value Date/Time   CREATININE 0.92 04/13/2022 11:02 AM   CREATININE 1.15 02/05/2021 09:59 AM   CREATININE 1.25 (H) 12/05/2019 09:41 AM   GFR 60.65 04/13/2022 11:02 AM   GFRNONAA >60 12/28/2017 05:24 AM   GFRAA >60 12/28/2017 05:24 AM       Latest Ref Rng & Units 04/13/2022   11:02 AM 02/05/2021    9:59 AM 05/16/2020   10:07 AM  BMP  Glucose 70 - 99 mg/dL 85  87  98   BUN 6 - 23 mg/dL 17  25  25    Creatinine 0.40 - 1.20 mg/dL 0.92  1.15  0.95   Sodium 135 - 145 mEq/L 138  140  141   Potassium 3.5 - 5.1 mEq/L 4.6  4.9  4.1   Chloride 96 -  112 mEq/L 99  103  101   CO2 19 - 32 mEq/L 28  28  28    Calcium 8.4 - 10.5 mg/dL 9.9  9.8  9.8       Ned Clines [place] Clinical Pharmacist Assistant [phone]

## 2022-05-07 ENCOUNTER — Other Ambulatory Visit: Payer: Self-pay | Admitting: Internal Medicine

## 2022-05-07 DIAGNOSIS — F419 Anxiety disorder, unspecified: Secondary | ICD-10-CM

## 2022-05-11 DIAGNOSIS — E119 Type 2 diabetes mellitus without complications: Secondary | ICD-10-CM | POA: Diagnosis not present

## 2022-05-11 DIAGNOSIS — H353123 Nonexudative age-related macular degeneration, left eye, advanced atrophic without subfoveal involvement: Secondary | ICD-10-CM | POA: Diagnosis not present

## 2022-05-11 DIAGNOSIS — H35372 Puckering of macula, left eye: Secondary | ICD-10-CM | POA: Diagnosis not present

## 2022-05-11 DIAGNOSIS — H353211 Exudative age-related macular degeneration, right eye, with active choroidal neovascularization: Secondary | ICD-10-CM | POA: Diagnosis not present

## 2022-05-11 DIAGNOSIS — H43813 Vitreous degeneration, bilateral: Secondary | ICD-10-CM | POA: Diagnosis not present

## 2022-05-12 ENCOUNTER — Other Ambulatory Visit: Payer: Self-pay | Admitting: Internal Medicine

## 2022-05-13 NOTE — Plan of Care (Signed)
Chronic Care Management Provider Comprehensive Care Plan     Name: Katie Booker MRN: [id number] DOB: 1945/09/24  Referral to Chronic Care Management (CCM) services was placed by Provider:  Isaac Bliss, Rayford Halsted, MD on Date: 02/24/2022.   Chronic Condition 1: Diabetes Provider Assessment and Plan  Type 2 diabetes mellitus with other specified complication, without long-term current use of insulin ([name])  - Plan: POCT glycosylated hemoglobin (Hb A1C) -A1c is well controlled at 5.9.  Expected Outcome/Goals Addressed This Visit (Provider CCM goals/Provider Assessment and plan  Goal: CCM (Diabetes) Expected Outcome: Monitor, Self-Manage And Reduce Symptoms of Diabetes   Symptom Management Condition 1: Attend all scheduled provider appointments Take medications as prescribed Monitor blood glucose and maintain a log Check feet daily for cuts, sores or redness Wash and dry feet carefully every day Wear comfortable, cotton socks Wear comfortable, well-fitting shoes Read food labels for fat, fiber, carbohydrates and portion size Limit intake of foods with added sugars Call provider office for new concerns or questions      Chronic Condition 2: Hypertension Provider Assessment and Plan  Essential hypertension  - Plan: CBC with Differential/Platelet, Comprehensive metabolic panel -Fair blood pressure control.    Expected Outcome/Goals Addressed This Visit (Provider CCM goals/Provider Assessment and plan  Goal: CCM (Hypertension) Expected Outcome: Monitor, Self-Manage And Reduce Symptoms of Hypertension   Symptom Management Condition 2: Take all medications as prescribed Attend all scheduled provider appointments Call pharmacy for medication refills 3-7 days in advance of running out of medications Check blood pressure  Keep a blood pressure log Call doctor for signs and symptoms of high blood pressure Read nutrition labels. Eat whole grains, fruits and vegetables, lean  meats and healthy fats Limit salt intake  Call provider office for new concerns or questions   Problem List Patient Active Problem List   Diagnosis Date Noted   Complete rupture of left Achilles tendon 11/03/2021   Morbid obesity ([name]) 01/13/2018   S/P knee replacement 12/27/2017   Spondylolisthesis at L1-L2 level 03/18/2015   Spondylosis with myelopathy 09/02/2014   Cervical spondylosis with myelopathy 09/02/2014   Anxiety 12/21/2012   Chronic back pain 12/21/2012   Insomnia 12/21/2012   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, L2-5 12/15/2012   Spinal stenosis, lumbar region, with neurogenic claudication 10/26/2012   Spinal stenosis, thoracic [id number]   Umbilical hernia [id number]   OSA (obstructive sleep apnea) 11/12/2011   Exogenous obesity 11/12/2011   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   Hypothyroidism 09/18/2009   Diabetes mellitus without complication ([name]) [id number]   Dyslipidemia 09/18/2009   Anemia 09/18/2009   Depression 09/18/2009   Essential hypertension 09/18/2009   GERD 09/18/2009   Primary localized osteoarthritis of left knee 09/18/2009    Medication Management  Current Outpatient Medications:    ALPRAZolam (XANAX) 0.5 MG tablet, TAKE 1 TABLET(0.5 MG) BY MOUTH THREE TIMES DAILY AS NEEDED FOR ANXIETY, Disp: 270 tablet, Rfl: 0   atenolol (TENORMIN) 25 MG tablet, TAKE 1 TABLET BY MOUTH DAILY, Disp: 100 tablet, Rfl: 2   atorvastatin (LIPITOR) 40 MG tablet, TAKE 1 TABLET BY MOUTH DAILY, Disp: 100 tablet, Rfl: 2   calcium citrate (CALCITRATE - DOSED IN MG ELEMENTAL CALCIUM) 950 (200 Ca) MG tablet, Take 200 mg of elemental calcium by mouth daily., Disp: , Rfl:    Carboxymethylcell-Hypromellose (GENTEAL OP), Apply to eye., Disp: , Rfl:    Carboxymethylcellulose Sodium (LUBRICANT EYE DROPS OP), Apply 2 drops to eye 4 (four) times daily as needed (dry eyes). , Disp: ,  Rfl:    citalopram (CELEXA) 40 MG tablet, TAKE 1 TABLET BY MOUTH DAILY, Disp: 90 tablet, Rfl:  0   estradiol (ESTRACE) 0.1 MG/GM vaginal cream, PLACE 1 APPLICATORFUL  VAGINALLY 3 TIMES A WEEK., Disp: 170 g, Rfl: 3   HYDROcodone-acetaminophen (NORCO) 7.5-325 MG tablet, Take 1 tablet by mouth every 6 (six) hours as needed for moderate pain., Disp: 120 tablet, Rfl: 0   HYDROcodone-acetaminophen (NORCO) 7.5-325 MG tablet, Take 1 tablet by mouth every 6 (six) hours as needed for moderate pain., Disp: 120 tablet, Rfl: 0   HYDROcodone-acetaminophen (NORCO) 7.5-325 MG tablet, Take 1 tablet by mouth every 6 (six) hours as needed for moderate pain., Disp: 120 tablet, Rfl: 0   hydrocortisone 2.5 % cream, APPLY TO AFFECTED AREA(S)  TOPICALLY TWICE DAILY, Disp: 85.05 g, Rfl: 1   Lancets (ONETOUCH ULTRASOFT) lancets, Use daily, Disp: 100 each, Rfl: 6   levothyroxine (SYNTHROID) 125 MCG tablet, Take 1 tablet (125 mcg total) by mouth daily., Disp: 100 tablet, Rfl: 1   lisinopril (ZESTRIL) 10 MG tablet, Take 1 tablet (10 mg total) by mouth daily., Disp: 90 tablet, Rfl: 0   meloxicam (MOBIC) 15 MG tablet, TAKE 1 TABLET BY MOUTH DAILY, Disp: 100 tablet, Rfl: 2   metFORMIN (GLUCOPHAGE) 1000 MG tablet, TAKE 1 TABLET BY MOUTH TWICE  DAILY WITH MEALS, Disp: 200 tablet, Rfl: 1   Misc Natural Products (TURMERIC CURCUMIN) CAPS, Take by mouth., Disp: , Rfl:    Misc. Devices (BARIATRIC ROLLATOR) MISC, 1 Rollator with basket., Disp: 1 each, Rfl: 0   Multiple Vitamin (MULTIVITAMIN) tablet, Take 1 tablet by mouth daily., Disp: , Rfl:    Omega-3 Fatty Acids (FISH OIL) 1200 MG CAPS, Take 1 capsule by mouth daily. , Disp: , Rfl:    ONE TOUCH ULTRA TEST test strip, USE 1 DAILY AS DIRECTED, Disp: 100 each, Rfl: 3   pantoprazole (PROTONIX) 40 MG tablet, TAKE 1 TABLET BY MOUTH TWICE  DAILY BEFORE MEALS, Disp: 200 tablet, Rfl: 1   pioglitazone (ACTOS) 15 MG tablet, TAKE 1 TABLET BY MOUTH DAILY, Disp: 100 tablet, Rfl: 0   pramipexole (MIRAPEX) 0.125 MG tablet, Take 1 tablet (0.125 mg total) by mouth at bedtime., Disp: 30 tablet,  Rfl: 2   pregabalin (LYRICA) 75 MG capsule, Take 1 capsule (75 mg total) by mouth 2 (two) times daily., Disp: 180 capsule, Rfl: 0   tiZANidine (ZANAFLEX) 4 MG tablet, TAKE 1 TABLET BY MOUTH 3 TIMES  DAILY AS NEEDED, Disp: 180 tablet, Rfl: 0   tobramycin (TOBREX) 0.3 % ophthalmic solution, Place 1 drop into both eyes every 6 (six) hours. Day before injection, day of injection and day after injection, Disp: , Rfl:    traZODone (DESYREL) 100 MG tablet, TAKE 1 TO 2 TABLETS BY MOUTH AT  BEDTIME, Disp: 180 tablet, Rfl: 3   Vitamin D, Ergocalciferol, (DRISDOL) 1.25 MG (50000 UNIT) CAPS capsule, TAKE 1 CAPSULE BY MOUTH  EVERY 7 DAYS FOR 12 DOSES, Disp: 12 capsule, Rfl: 3   Cognitive Assessment Identity Confirmed: : Name; DOB Cognitive Status: Normal   Functional Assessment Hearing Difficulty or Deaf: no Wear Glasses or Blind: yes Vision Management: Wears Glasses Concentrating, Remembering or Making Decisions Difficulty (CP): yes Concentration Management: Reports episodes of issues with short-term member. Reports friend/neighbor available to assist if needed Difficulty Communicating: no Difficulty Eating/Swallowing: no Walking or Climbing Stairs Difficulty: yes Walking or Climbing Stairs: ambulation difficulty, requires equipment Mobility Management: Uses cane or rollator walker Dressing/Bathing Difficulty: no Doing Errands  Independently Difficulty (such as shopping) (CP): yes Errands Management: Reports neighbor is available to assist   Caregiver Assessment  Primary Source of Support/Comfort: child(ren); friend Name of Support/Comfort Primary Source: Lorenda Hatchet and children People in Home: alone Family Caregiver if Needed: none   Planned Interventions   Diabetes Reviewed provider's plan for diabetes management.  Reviewed medication. Reports taking medications as prescribed. Reports episodes of short-term memory loss but reports doing well with medication management. Discussed  importance of blood glucose readings. Reports currently not monitoring. Advised to monitor fasting readings and maintain a log. Provided information regarding hypoglycemia and hyperglycemia along with appropriate interventions. Denies recent hypoglycemic or hyperglycemic episodes. Discussed nutritional intake and importance of complying with a diabetic diet. Reports attempting to adhere to recommended diet, but notes meal choices aren't always optimal. Discussed benefits of grocery delivery and options for meal delivery. Advised to increase consumption of fruits, vegetables, and lean proteins. Advised to monitor intake of carbohydrates and avoid foods and beverages with added sugar when possible.  Discussed importance of completing recommended DM preventive care. Reports completing daily foot care as advised. Reports eye is exam is up to date. Currently being treated for macular degeneration. Discussed importance of completing ordered labs as prescribed.  Assessed social determinant of health barriers.  Hypertension Reviewed current treatment plan related to Hypertension, self-management, and adherence to plan as established by provider.  Reviewed medications and indications for use. Reports taking medications as prescribed. Reports tolerating regimen. Reports some episodes of short-term memory loss but reports managing medications well. Agreed to keep team update of concerns r/t medication management or prescription cost. Provided information regarding established blood pressure parameters along with indications for notifying a provider. Reports not monitoring consistently. Advised to try and monitor daily and record readings to identify trends. Reviewed symptoms. Denies chest pain or palpitations. Denies headaches, dizziness, or visual changes.  Discussed compliance with recommended cardiac prudent diet. Encouraged to read nutrition labels, continue monitoring sodium intake, and avoid highly processed  foods when possible. Reports doing "ok" with meals. Primary concern is meal choices. Discussed possible benefits of grocery delivery and options for meal delivery. Agreed to update with decision. Will submit additional referrals if needed. Reviewed s/sx of heart attack, stroke and worsening symptoms that require immediate medical attention.  Interaction and coordination with outside resources, practitioners, and providers See CCM Referral  Care Plan: Available in MyChart

## 2022-05-13 NOTE — Patient Instructions (Signed)
Thank you for allowing the Chronic Care Management team to participate in your care. It was great speaking with you!    Following is a copy of the CCM Program Consent:  CCM service includes personalized support from designated clinical staff supervised by the physician, including individualized plan of care and coordination with other care providers 24/7 contact phone numbers for assistance for urgent and routine care needs. Service will only be billed when office clinical staff spend 20 minutes or more in a month to coordinate care. Only one practitioner may furnish and bill the service in a calendar month. The patient may stop CCM services at amy time (effective at the end of the month) by phone call to the office staff. The patient will be responsible for cost sharing (co-pay) or up to 20% of the service fee (after annual deductible is met)  Following is a copy of your full provider care plan:   Goals Addressed             This Visit's Progress    Goal: CCM (Diabetes) Expected Outcome:  Monitor, Self-Manage And Reduce Symptoms of Diabetes       Current Barriers:  Chronic Disease Management support and education needs related to Diabetes  Planned Interventions: Reviewed provider's plan for diabetes management.  Reviewed medication. Reports taking medications as prescribed. Reports episodes of short-term memory loss but reports doing well with medication management. Discussed importance of blood glucose readings. Reports currently not monitoring. Advised to monitor fasting readings and maintain a log. Provided information regarding hypoglycemia and hyperglycemia along with appropriate interventions. Denies recent hypoglycemic or hyperglycemic episodes. Discussed nutritional intake and importance of complying with a diabetic diet. Reports attempting to adhere to recommended diet, but notes meal choices aren't always optimal. Discussed benefits of grocery delivery and options for meal  delivery. Advised to increase consumption of fruits, vegetables, and lean proteins. Advised to monitor intake of carbohydrates and avoid foods and beverages with added sugar when possible.  Discussed importance of completing recommended DM preventive care. Reports completing daily foot care as advised. Reports eye is exam is up to date. Currently being treated for macular degeneration. Discussed importance of completing ordered labs as prescribed.  Assessed social determinant of health barriers.   Lab Results  Component Value Date   HGBA1C 5.9 (A) 09/10/2021    Symptom Management: Attend all scheduled provider appointments Take medications as prescribed Monitor blood glucose and maintain a log Check feet daily for cuts, sores or redness Wash and dry feet carefully every day Wear comfortable, cotton socks Wear comfortable, well-fitting shoes Read food labels for fat, fiber, carbohydrates and portion size Limit intake of foods with added sugars Call provider office for new concerns or questions   Follow Up Plan:  Will follow up within the next month       Goal: CCM (Hypertension) Expected Outcome:  Monitor, Self-Manage And Reduce Symptoms of Hypertension       Current Barriers:  Chronic Disease Management support and education needs related to Hypertension  Planned Interventions: Reviewed current treatment plan related to Hypertension, self-management, and adherence to plan as established by provider.  Reviewed medications and indications for use. Reports taking medications as prescribed. Reports tolerating regimen. Reports some episodes of short-term memory loss but reports managing medications well. Agreed to keep team update of concerns r/t medication management or prescription cost. Provided information regarding established blood pressure parameters along with indications for notifying a provider. Reports not monitoring consistently. Advised to try and monitor  daily and record  readings to identify trends. Reviewed symptoms. Denies chest pain or palpitations. Denies headaches, dizziness, or visual changes.  Discussed compliance with recommended cardiac prudent diet. Encouraged to read nutrition labels, continue monitoring sodium intake, and avoid highly processed foods when possible. Reports doing "ok" with meals. Primary concern is meal choices. Discussed possible benefits of grocery delivery and options for meal delivery. Agreed to update with decision. Will submit additional referrals if needed. Reviewed s/sx of heart attack, stroke and worsening symptoms that require immediate medical attention.   BP Readings from Last 3 Encounters:  03/26/22 (!) 145/69  10/19/21 138/78  09/14/21 (!) 150/86    Symptom Management: Take all medications as prescribed Attend all scheduled provider appointments Call pharmacy for medication refills 3-7 days in advance of running out of medications Check blood pressure  Keep a blood pressure log Call doctor for signs and symptoms of high blood pressure Read nutrition labels. Eat whole grains, fruits and vegetables, lean meats and healthy fats Limit salt intake  Call provider office for new concerns or questions    Follow Up Plan:  Will follow up within the next month          Ms. Bayle verbalized understanding of instructions and care plan provided and agreed to view in [place].   A member of the care management team will follow up within the next month.     Horris Latino RN Care Manager/Chronic Care Management [phone]

## 2022-05-13 NOTE — Chronic Care Management (AMB) (Signed)
Chronic Care Management   CCM RN Visit Note   Name: Katie Booker MRN: [id number] DOB: 08-May-1945  Subjective: Katie Booker is a 77 y.o. year old female who is a primary care patient of Isaac Bliss, Rayford Halsted, MD. The patient was referred to the Chronic Care Management team for assistance with care management needs subsequent to provider initiation of CCM services and plan of care.    Today's Visit:  Engaged with patient by telephone for initial visit.     SDOH Interventions Today    Flowsheet Row Most Recent Value  SDOH Interventions   Food Insecurity Interventions Intervention Not Indicated  Housing Interventions Intervention Not Indicated  Transportation Interventions Intervention Not Indicated  Utilities Interventions Intervention Not Indicated  Financial Strain Interventions Intervention Not Indicated  Physical Activity Interventions Other (Comments)  [Ability to exercise limited d/t impaired balance/mobility. Ortho discontinued PT d/t risk of further injury. Requires clearance]         Goals Addressed             This Visit's Progress    Goal: CCM (Diabetes) Expected Outcome:  Monitor, Self-Manage And Reduce Symptoms of Diabetes       Current Barriers:  Chronic Disease Management support and education needs related to Diabetes  Planned Interventions: Reviewed provider's plan for diabetes management.  Reviewed medication. Reports taking medications as prescribed. Reports episodes of short-term memory loss but reports doing well with medication management. Discussed importance of blood glucose readings. Reports currently not monitoring. Advised to monitor fasting readings and maintain a log. Provided information regarding hypoglycemia and hyperglycemia along with appropriate interventions. Denies recent hypoglycemic or hyperglycemic episodes. Discussed nutritional intake and importance of complying with a diabetic diet. Reports attempting to adhere to  recommended diet, but notes meal choices aren't always optimal. Discussed benefits of grocery delivery and options for meal delivery. Advised to increase consumption of fruits, vegetables, and lean proteins. Advised to monitor intake of carbohydrates and avoid foods and beverages with added sugar when possible.  Discussed importance of completing recommended DM preventive care. Reports completing daily foot care as advised. Reports eye is exam is up to date. Currently being treated for macular degeneration. Discussed importance of completing ordered labs as prescribed.  Assessed social determinant of health barriers.   Lab Results  Component Value Date   HGBA1C 5.9 (A) 09/10/2021    Symptom Management: Attend all scheduled provider appointments Take medications as prescribed Monitor blood glucose and maintain a log Check feet daily for cuts, sores or redness Wash and dry feet carefully every day Wear comfortable, cotton socks Wear comfortable, well-fitting shoes Read food labels for fat, fiber, carbohydrates and portion size Limit intake of foods with added sugars Call provider office for new concerns or questions   Follow Up Plan:  Will follow up within the next month       Goal: CCM (Hypertension) Expected Outcome:  Monitor, Self-Manage And Reduce Symptoms of Hypertension       Current Barriers:  Chronic Disease Management support and education needs related to Hypertension  Planned Interventions: Reviewed current treatment plan related to Hypertension, self-management, and adherence to plan as established by provider.  Reviewed medications and indications for use. Reports taking medications as prescribed. Reports tolerating regimen. Reports some episodes of short-term memory loss but reports managing medications well. Agreed to keep team update of concerns r/t medication management or prescription cost. Provided information regarding established blood pressure parameters along  with indications for notifying a provider. Reports  not monitoring consistently. Advised to try and monitor daily and record readings to identify trends. Reviewed symptoms. Denies chest pain or palpitations. Denies headaches, dizziness, or visual changes.  Discussed compliance with recommended cardiac prudent diet. Encouraged to read nutrition labels, continue monitoring sodium intake, and avoid highly processed foods when possible. Reports doing "ok" with meals. Primary concern is meal choices. Discussed possible benefits of grocery delivery and options for meal delivery. Agreed to update with decision. Will submit additional referrals if needed. Reviewed s/sx of heart attack, stroke and worsening symptoms that require immediate medical attention.   BP Readings from Last 3 Encounters:  03/26/22 (!) 145/69  10/19/21 138/78  09/14/21 (!) 150/86    Symptom Management: Take all medications as prescribed Attend all scheduled provider appointments Call pharmacy for medication refills 3-7 days in advance of running out of medications Check blood pressure  Keep a blood pressure log Call doctor for signs and symptoms of high blood pressure Read nutrition labels. Eat whole grains, fruits and vegetables, lean meats and healthy fats Limit salt intake  Call provider office for new concerns or questions    Follow Up Plan:  Will follow up within the next month          PLAN: A member of the care management team will follow up within the next month   [place] Manager/Chronic Care Management [phone]

## 2022-05-14 ENCOUNTER — Other Ambulatory Visit: Payer: Medicare Other

## 2022-05-20 ENCOUNTER — Encounter: Payer: Self-pay | Admitting: Internal Medicine

## 2022-05-20 DIAGNOSIS — G2581 Restless legs syndrome: Secondary | ICD-10-CM

## 2022-05-23 ENCOUNTER — Other Ambulatory Visit: Payer: Self-pay | Admitting: Internal Medicine

## 2022-05-25 MED ORDER — PRAMIPEXOLE DIHYDROCHLORIDE 0.125 MG PO TABS: 0.1250 mg | ORAL_TABLET | Freq: Every day | ORAL | 2 refills | Status: DC

## 2022-05-27 ENCOUNTER — Other Ambulatory Visit: Payer: Self-pay | Admitting: *Deleted

## 2022-05-27 DIAGNOSIS — E039 Hypothyroidism, unspecified: Secondary | ICD-10-CM

## 2022-05-27 MED ORDER — LEVOTHYROXINE SODIUM 125 MCG PO TABS: 125.0000 ug | ORAL_TABLET | Freq: Every day | ORAL | 1 refills | Status: DC

## 2022-06-03 ENCOUNTER — Encounter: Payer: Self-pay | Admitting: Internal Medicine

## 2022-06-03 NOTE — Telephone Encounter (Signed)
Left message on machine for patient letting her know the  Correct dose is 125 mcg

## 2022-06-03 NOTE — Telephone Encounter (Signed)
Lab result note 02/05/21 . TSH is oversuppressed. Decrease levothyroxine to 125 mcg

## 2022-06-09 ENCOUNTER — Other Ambulatory Visit: Payer: Self-pay | Admitting: Internal Medicine

## 2022-06-18 ENCOUNTER — Other Ambulatory Visit: Payer: Self-pay | Admitting: Internal Medicine

## 2022-07-07 DIAGNOSIS — H35372 Puckering of macula, left eye: Secondary | ICD-10-CM | POA: Diagnosis not present

## 2022-07-07 DIAGNOSIS — H353123 Nonexudative age-related macular degeneration, left eye, advanced atrophic without subfoveal involvement: Secondary | ICD-10-CM | POA: Diagnosis not present

## 2022-07-07 DIAGNOSIS — H43813 Vitreous degeneration, bilateral: Secondary | ICD-10-CM | POA: Diagnosis not present

## 2022-07-07 DIAGNOSIS — H353211 Exudative age-related macular degeneration, right eye, with active choroidal neovascularization: Secondary | ICD-10-CM | POA: Diagnosis not present

## 2022-07-07 DIAGNOSIS — E119 Type 2 diabetes mellitus without complications: Secondary | ICD-10-CM | POA: Diagnosis not present

## 2022-07-13 ENCOUNTER — Telehealth: Payer: Self-pay

## 2022-07-13 NOTE — Progress Notes (Unsigned)
Care Management & Coordination Services Pharmacy Note  07/13/2022 Name:  Katie Booker MRN:  [id number] DOB:  1945-10-10  Summary: LDL slightly elevated above goal <70, TG not at goal <150 A1C at goal <7  Recommendations/Changes made from today's visit: -Counseled on cholesterol-lowering diet and exercise -Counseled on low-carb diet, discussed ozempic and patient plans to discuss with PCP. -Scheduled f/u visit with PCP at pt request.   Follow up plan: DM call in July and Sept Pharmacist visit in 6-7 months   Subjective: Katie Booker is an 77 y.o. year old female who is a primary patient of Philip Aspen, Limmie Patricia, MD.  The care coordination team was consulted for assistance with disease management and care coordination needs.    Engaged with patient by telephone for follow up visit.  Recent office visits: 04/14/22 Katie Fairly, RN - Patient presented for [facility] Nurse Visit.   04/13/22  Philip Aspen, Limmie Patricia, MD - Patient presented for Medicare Annual Wellness visit and other concerns. No medication changes.  01/13/22 Philip Aspen, Limmie Patricia, MD  - Patient presented via video for Restless leg syndrome. No medication changes   Recent consult visits: 03/26/22 Penumalli, Glenford Bayley, MD (Neurology) - Patient presented for Restless leg syndrome and other concerns. OK to Increase Pramipexole.   Hospital visits: None in previous 6 months   Objective:  Lab Results  Component Value Date   CREATININE 0.92 04/13/2022   BUN 17 04/13/2022   GFR 60.65 04/13/2022   GFRNONAA >60 12/28/2017   GFRAA >60 12/28/2017   NA 138 04/13/2022   K 4.6 04/13/2022   CALCIUM 9.9 04/13/2022   CO2 28 04/13/2022   GLUCOSE 85 04/13/2022    Lab Results  Component Value Date/Time   HGBA1C 6.1 04/13/2022 11:02 AM   HGBA1C 5.9 (A) 09/10/2021 09:53 AM   HGBA1C 6.1 02/05/2021 09:59 AM   GFR 60.65 04/13/2022 11:02 AM   GFR 46.79 (L) 02/05/2021 09:59 AM   MICROALBUR <0.7 04/13/2022  11:02 AM   MICROALBUR <0.7 04/19/2016 08:42 AM    Last diabetic Eye exam:  Lab Results  Component Value Date/Time   HMDIABEYEEXA No Retinopathy 12/14/2017 12:00 AM    Last diabetic Foot exam: No results found for: "HMDIABFOOTEX"   Lab Results  Component Value Date   CHOL 165 04/13/2022   HDL 60.10 04/13/2022   LDLCALC 59 02/05/2021   LDLDIRECT 73.0 04/13/2022   TRIG 221.0 (H) 04/13/2022   CHOLHDL 3 04/13/2022       Latest Ref Rng & Units 04/13/2022   11:02 AM 02/05/2021    9:59 AM 12/05/2019    9:41 AM  Hepatic Function  Total Protein 6.0 - 8.3 g/dL 6.8  6.7  6.3   Albumin 3.5 - 5.2 g/dL 4.1  4.2    AST 0 - 37 U/L 17  18  22    ALT 0 - 35 U/L 16  17  21    Alk Phosphatase 39 - 117 U/L 66  63    Total Bilirubin 0.2 - 1.2 mg/dL 0.5  0.3  0.3     Lab Results  Component Value Date/Time   TSH 0.62 04/13/2022 11:02 AM   TSH 1.12 03/25/2021 01:15 PM       Latest Ref Rng & Units 04/13/2022   11:02 AM 02/05/2021    9:59 AM 12/05/2019    9:41 AM  CBC  WBC 4.0 - 10.5 K/uL 5.9  4.5  7.4   Hemoglobin 12.0 - 15.0 g/dL 12.9  11.7  11.8   Hematocrit 36.0 - 46.0 % 38.7  35.6  35.8   Platelets 150.0 - 400.0 K/uL 167.0  175.0  189     Lab Results  Component Value Date/Time   VD25OH 32.06 04/13/2022 11:02 AM   VD25OH 46.32 02/05/2021 09:59 AM   VITAMINB12 763 04/13/2022 11:02 AM   VITAMINB12 411 02/05/2021 09:59 AM    Clinical ASCVD: No  The 10-year ASCVD risk score (Arnett DK, et al., 2019) is: 42.9%   Values used to calculate the score:     Age: 77 years     Sex: Female     Is Non-Hispanic African American: No     Diabetic: Yes     Tobacco smoker: No     Systolic Blood Pressure: 134 mmHg     Is BP treated: Yes     HDL Cholesterol: 60.1 mg/dL     Total Cholesterol: 165 mg/dL       05/31/8117   14:78 AM 04/01/2022    3:37 PM 01/13/2022    3:24 PM  Depression screen PHQ 2/9  Decreased Interest 1 0 1  Down, Depressed, Hopeless 1 1 1   PHQ - 2 Score 2 1 2   Altered  sleeping 0  0  Tired, decreased energy 3  2  Change in appetite 3  0  Feeling bad or failure about yourself  0  0  Trouble concentrating 2  1  Moving slowly or fidgety/restless 0  2  Suicidal thoughts 0  0  PHQ-9 Score 10  7  Difficult doing work/chores Very difficult       Social History   Tobacco Use  Smoking Status Former   Packs/day: 1.00   Years: 20.00   Additional pack years: 0.00   Total pack years: 20.00   Types: Cigarettes   Quit date: 02/23/2004   Years since quitting: 18.3  Smokeless Tobacco Never  Tobacco Comments   quit 2007   BP Readings from Last 3 Encounters:  04/13/22 134/77  03/26/22 (!) 145/69  10/19/21 138/78   Pulse Readings from Last 3 Encounters:  04/13/22 73  03/26/22 66  10/19/21 77   Wt Readings from Last 3 Encounters:  04/13/22 202 lb 8 oz (91.9 kg)  03/26/22 195 lb (88.5 kg)  10/19/21 195 lb 3.2 oz (88.5 kg)   BMI Readings from Last 3 Encounters:  04/13/22 39.55 kg/m  03/26/22 38.08 kg/m  10/19/21 38.12 kg/m    Allergies  Allergen Reactions   Duraprep [Antiseptic Products, Misc.] Itching and Rash    Irritation everywhere prep was used   Advil Allergy Sinus [Chlorpheniramine-Pse-Ibuprofen]     Nervousness    Betadine [Povidone Iodine] Other (See Comments)    Burning sensation.    Hydromorphone Hcl Other (See Comments)    Makes crazy   Morphine And Codeine Other (See Comments)    hallucinations    Medications Reviewed Today     Reviewed by Trenton Gammon, CMA (Certified Medical Assistant) on 04/13/22 at 1004  Med List Status: <None>   Medication Order Taking? Sig Documenting Provider Last Dose Status Informant  ALPRAZolam (XANAX) 0.5 MG tablet [id number] Yes Take 1 tablet (0.5 mg total) by mouth 3 (three) times daily as needed. for anxiety Philip Aspen, Limmie Patricia, MD Taking Active   atenolol (TENORMIN) 25 MG tablet [id number] Yes TAKE 1 TABLET BY MOUTH DAILY Philip Aspen, Limmie Patricia, MD Taking Active   atorvastatin  (LIPITOR) 40 MG tablet [id number] Yes TAKE 1 TABLET  BY MOUTH DAILY Philip Aspen, Limmie Patricia, MD Taking Active   calcium citrate (CALCITRATE - DOSED IN MG ELEMENTAL CALCIUM) 950 (200 Ca) MG tablet [id number] Yes Take 200 mg of elemental calcium by mouth daily. [provider] Taking Active   Carboxymethylcell-Hypromellose [facility] OP) [id number] Yes Apply to eye. [provider] Taking Active   Carboxymethylcellulose Sodium (LUBRICANT EYE DROPS OP) [id number] Yes Apply 2 drops to eye 4 (four) times daily as needed (dry eyes).  [provider] Taking Active Self  citalopram (CELEXA) 40 MG tablet [id number] Yes TAKE 1 TABLET BY MOUTH DAILY Philip Aspen, Limmie Patricia, MD Taking Active   estradiol (ESTRACE) 0.1 MG/GM vaginal cream [id number] Yes PLACE 1 APPLICATORFUL  VAGINALLY 3 TIMES A WEEK. Philip Aspen, Limmie Patricia, MD Taking Active   HYDROcodone-acetaminophen [facility]) 7.5-325 MG tablet [id number] Yes Take 1 tablet by mouth every 6 (six) hours as needed for moderate pain. Philip Aspen, Limmie Patricia, MD Taking Active   HYDROcodone-acetaminophen [facility]) 7.5-325 MG tablet [id number] Yes Take 1 tablet by mouth every 6 (six) hours as needed for moderate pain. Philip Aspen, Limmie Patricia, MD Taking Active   HYDROcodone-acetaminophen [facility]) 7.5-325 MG tablet [id number] Yes Take 1 tablet by mouth every 6 (six) hours as needed for moderate pain. Philip Aspen, Limmie Patricia, MD Taking Active   hydrocortisone 2.5 % cream [id number] Yes APPLY TO AFFECTED AREA(S)  TOPICALLY TWICE DAILY Philip Aspen, Limmie Patricia, MD Taking Active   Lancets [facility] ULTRASOFT) lancets [id number] Yes Use daily Gordy Savers, MD Taking Active Self  levothyroxine (SYNTHROID) 125 MCG tablet [id number] Yes Take 1 tablet (125 mcg total) by mouth daily. Philip Aspen, Limmie Patricia, MD Taking Active   lisinopril (ZESTRIL) 10 MG tablet [id number] Yes Take 1 tablet (10 mg total) by mouth daily. Philip Aspen, Limmie Patricia, MD  Taking Active   meloxicam [facility]) 15 MG tablet [id number] Yes TAKE 1 TABLET BY MOUTH DAILY Philip Aspen, Limmie Patricia, MD Taking Active   metFORMIN (GLUCOPHAGE) 1000 MG tablet [id number] Yes TAKE 1 TABLET BY MOUTH  TWICE DAILY WITH MEALS Philip Aspen, Limmie Patricia, MD Taking Active   Misc Natural Products (TURMERIC CURCUMIN) CAPS [id number] Yes Take by mouth. [provider] Taking Active   Misc. Devices (BARIATRIC ROLLATOR) MISC [id number] Yes 1 Rollator with basket. Philip Aspen, Limmie Patricia, MD Taking Active   Multiple Vitamin (MULTIVITAMIN) tablet [id number] Yes Take 1 tablet by mouth daily. [provider] Taking Active Self  Omega-3 Fatty Acids (FISH OIL) 1200 MG CAPS [id number] Yes Take 1 capsule by mouth daily.  [provider] Taking Active Self  ONE TOUCH ULTRA TEST test strip [id number] Yes USE 1 DAILY AS DIRECTED Gordy Savers, MD Taking Active Self  pantoprazole (PROTONIX) 40 MG tablet [id number] Yes TAKE 1 TABLET BY MOUTH TWICE  DAILY BEFORE MEALS Philip Aspen, Limmie Patricia, MD Taking Active   pioglitazone (ACTOS) 15 MG tablet [id number] Yes TAKE 1 TABLET BY MOUTH DAILY Philip Aspen, Limmie Patricia, MD Taking Active   pramipexole (MIRAPEX) 0.125 MG tablet [id number] Yes Take 1 tablet (0.125 mg total) by mouth at bedtime. Philip Aspen, Limmie Patricia, MD Taking Active   pregabalin (LYRICA) 75 MG capsule [id number] Yes Take 1 capsule (75 mg total) by mouth 2 (two) times daily. Philip Aspen, Limmie Patricia, MD Taking Active   tiZANidine (ZANAFLEX) 4 MG tablet [id number] Yes TAKE 1 TABLET BY MOUTH 3 TIMES  DAILY AS NEEDED Philip Aspen, Limmie Patricia, MD Taking Active   tobramycin (TOBREX) 0.3 % ophthalmic  solution [id number] Yes Place 1 drop into both eyes every 6 (six) hours. Day before injection, day of injection and day after injection [provider] Taking Active   traZODone (DESYREL) 100 MG tablet [id number] Yes TAKE 1 TO 2 TABLETS BY MOUTH AT  BEDTIME Philip Aspen, Limmie Patricia, MD Taking Active   Vitamin D, Ergocalciferol, (DRISDOL) 1.25 MG (50000 UNIT) CAPS capsule [id number] Yes TAKE 1 CAPSULE BY MOUTH  EVERY 7 DAYS FOR 12 DOSES Philip Aspen, Limmie Patricia, MD Taking Active             SDOH:  (Social Determinants of Health) assessments and interventions performed: Yes SDOH Interventions    Flowsheet Row Office Visit from 04/13/2022 in [facility] [place] [facility] [place] Chronic Care Management from 04/01/2022 in [facility] [place] Office Visit from 09/10/2021 in [facility] [place] Office Visit from 02/05/2021 in [facility] [place] [facility] [place] Office Visit from 11/06/2020 in [name] [place] Chronic Care Management from 05/12/2020 in [facility] [place] [facility] [place]  SDOH Interventions        Food Insecurity Interventions Intervention Not Indicated Intervention Not Indicated -- -- -- --  Housing Interventions -- Intervention Not Indicated -- -- -- --  Transportation Interventions -- Intervention Not Indicated -- -- -- Intervention Not Indicated  Utilities Interventions -- Intervention Not Indicated -- -- -- --  Depression Interventions/Treatment  -- -- Medication Medication Currently on Treatment --  Financial Strain Interventions Intervention Not Indicated Intervention Not Indicated -- -- -- Intervention Not Indicated  Physical Activity Interventions -- Other (Comments)  [Ability to exercise limited d/t impaired balance/mobility. Ortho discontinued PT d/t risk of further injury. Requires clearance] -- -- -- --       Medication Assistance: None required.  Patient affirms current coverage meets needs.  Medication Access: Name and location of current pharmacy:  [id number] [place], [place] - [address] AT [facility] OF [facility] [address] [place] [address] Phone: [phone] Fax:  [phone]  [facility] - [place], [place] - [address] [place] [address] Phone: [phone] Fax: [phone]  Within the past 30 days, how often has patient missed a dose of medication? None Is a pillbox or other method used to improve adherence? Yes  Factors that may affect medication adherence? no barriers identified Are meds synced by current pharmacy? No  Are meds delivered by current pharmacy? No  Does patient experience delays in picking up medications due to transportation concerns? No   Compliance/Adherence/Medication fill history: Care Gaps: COVID Booster - last 06/30/20  Star-Rating Drugs: Atorvastatin 40mg   PDC 93% Metformin 1000mg  PDC 100% Lisinopril 10mg  PDC 44% LF 05/31/22 100DS Pioglitazone 15mg  PDC 100%   Assessment/Plan Hyperlipidemia: (LDL goal < 70, TG<150) -Not ideally controlled -Current treatment: Atorvastatin 40mg  1 qd Appropriate, Query Effective Omega 3 Fish Oil 1200mg  1 BID Appropriate, Query Effective -Medications previously tried: Gemfibrozil  -Current dietary patterns: admits to high fried and high fat foods -Current exercise habits: limited, patient self proclaims she is very out of shape -Educated on Cholesterol goals;  Benefits of statin for ASCVD risk reduction; Importance of limiting foods high in cholesterol; Exercise goal of 150 minutes per week; -Counseled on diet and exercise extensively Recommended to continue current medication -Future consideration: addition of zetia or increase statin to 80mg  daily if uncontrolled at next check  Diabetes (A1c goal <7%) -Controlled -Current medications: Metformin 1000mg  1 BID Appropriate, Effective, Safe, Accessible Pioglitazone 15mg  1 qd Appropriate, Effective, Safe, Accessible -Medications previously tried: Glimepiride  -Current home glucose readings Not checking -Denies hypoglycemic/hyperglycemic symptoms -Current meal patterns:   States she does not eat as healthy as she should -Current exercise: see above -Educated on A1c and blood sugar goals; Complications of diabetes including kidney damage, retinal damage, and cardiovascular disease; Benefits of routine self-monitoring of blood sugar; Counseled to check feet daily and get yearly eye exams -Wants to discuss potential of starting ozempic with PCP at next f/u visit. Would be safe for patient to use but may affect thyroid levels, would recommend TSH monitoring if started -Counseled to check feet daily and get yearly eye exams -Recommended to continue current medication -Future consideration: start ozempic, stop pioglitazone  Sherrill Raring Clinical Pharmacist [phone]

## 2022-07-13 NOTE — Progress Notes (Signed)
Patient ID: Katie Booker, female   DOB: [birth year], 77 y.o.   MRN: [id number]  Care Management & Coordination Services Pharmacy Team  Reason for Encounter: Appointment Reminder  Contacted patient to confirm telephone appointment with Milas Kocher, PharmD on 07/14/22 at 2:30. Spoke with patient on 07/13/2022     Star Rating Drugs:  Atorvastatin 40 mg - last filled 06/17/22 100 DS at [facility] Lisinopril 10 mg  - last filled 05/31/22 90 DS at [facility] Metformin 1000 mg - last filled 05/18/22 100 DS at [facility] Pioglitazone 15 mg  - last filled 06/23/22 100 DS at [facility]   Care Gaps: COVD Booster - Overdue AWV - 04/13/22   Pamala Duffel CMA Clinical Pharmacist Assistant [phone]

## 2022-07-14 ENCOUNTER — Ambulatory Visit: Payer: Medicare Other

## 2022-07-22 ENCOUNTER — Encounter (HOSPITAL_BASED_OUTPATIENT_CLINIC_OR_DEPARTMENT_OTHER): Payer: Medicare Other | Admitting: Obstetrics & Gynecology

## 2022-07-22 ENCOUNTER — Telehealth (HOSPITAL_BASED_OUTPATIENT_CLINIC_OR_DEPARTMENT_OTHER): Payer: Self-pay | Admitting: *Deleted

## 2022-07-22 ENCOUNTER — Encounter (HOSPITAL_BASED_OUTPATIENT_CLINIC_OR_DEPARTMENT_OTHER): Payer: Self-pay

## 2022-07-22 NOTE — Telephone Encounter (Signed)
Patient called and canceled stated she fell was unable to come .

## 2022-07-26 ENCOUNTER — Ambulatory Visit: Payer: Medicare Other | Admitting: Internal Medicine

## 2022-07-29 ENCOUNTER — Telehealth (INDEPENDENT_AMBULATORY_CARE_PROVIDER_SITE_OTHER): Payer: Medicare Other | Admitting: Internal Medicine

## 2022-07-29 VITALS — Wt 203.0 lb

## 2022-07-29 DIAGNOSIS — E119 Type 2 diabetes mellitus without complications: Secondary | ICD-10-CM | POA: Diagnosis not present

## 2022-07-29 DIAGNOSIS — M48062 Spinal stenosis, lumbar region with neurogenic claudication: Secondary | ICD-10-CM

## 2022-07-29 DIAGNOSIS — F419 Anxiety disorder, unspecified: Secondary | ICD-10-CM | POA: Diagnosis not present

## 2022-07-29 DIAGNOSIS — E039 Hypothyroidism, unspecified: Secondary | ICD-10-CM | POA: Diagnosis not present

## 2022-07-29 DIAGNOSIS — E785 Hyperlipidemia, unspecified: Secondary | ICD-10-CM

## 2022-07-29 DIAGNOSIS — I10 Essential (primary) hypertension: Secondary | ICD-10-CM | POA: Diagnosis not present

## 2022-07-29 MED ORDER — OZEMPIC (0.25 OR 0.5 MG/DOSE) 2 MG/3ML ~~LOC~~ SOPN
0.5000 mg | PEN_INJECTOR | SUBCUTANEOUS | 0 refills | Status: DC
Start: 2022-07-29 — End: 2023-01-11

## 2022-07-29 MED ORDER — HYDROCODONE-ACETAMINOPHEN 7.5-325 MG PO TABS: 1.0000 | ORAL_TABLET | Freq: Four times a day (QID) | ORAL | 0 refills | Status: DC | PRN

## 2022-07-29 MED ORDER — HYDROCODONE-ACETAMINOPHEN 7.5-325 MG PO TABS
1.0000 | ORAL_TABLET | Freq: Four times a day (QID) | ORAL | 0 refills | Status: DC | PRN
Start: 2022-07-29 — End: 2022-10-28

## 2022-07-29 NOTE — Progress Notes (Signed)
Virtual Visit via Video Note  I connected with Rickya Ketler on 07/29/22 at  1:30 PM EDT by a video enabled telemedicine application and verified that I am speaking with the correct person using two identifiers.  Location patient: home Location provider: work office Persons participating in the virtual visit: patient, provider  I discussed the limitations of evaluation and management by telemedicine and the availability of in person appointments. The patient expressed understanding and agreed to proceed.   HPI: She is scheduled this visit to discuss several issues:  1.  She has spinal stenosis and is due for hydrocodone refills per protocol.  2.  She would like to discuss potentially starting Ozempic.  This was brought to her attention by the pharmacist.  She would like to use this both to help controlling her weight and for A1c improvement.  #3 she tells me she has finally secured psychiatric care.  Had her first appointment.  4.  She remains concerned about her lack of balance, this is worsening.   ROS: Negative unless indicated in HPI.  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   Depression    DOE (dyspnea on exertion)    GERD    Headache    Hemorrhoids    History of basal cell carcinoma excision    left cheek   History of colon polyps    History of hyperthyroidism 2011   RAI treatement   History of panic attacks    Hyperlipidemia    Hypertension    Hypothyroidism, postradioiodine therapy    followed by pcp   IDA (iron deficiency anemia)    intermittant   Insomnia    OA (osteoarthritis)    OSA (obstructive sleep apnea)    no cpap, per pt tried unable to tolerate   Peripheral neuropathy     legs and feet from hx back surgery's   Rash    bilateral axilla area -- per pt due to some personal wipes used other the counter   RLS (restless legs syndrome)    Spondylolisthesis at L1-L2 level    Tingling of both upper extremities    left greater than right due to cervical pinched nerve   Type 2 diabetes mellitus (HCC)    followed by pcp   Umbilical hernia    Urinary frequency    Urinary urgency     Past Surgical History:  Procedure Laterality Date   ANTERIOR CERVICAL DECOMP/DISCECTOMY FUSION N/A 09/02/2014   Procedure: Cervical five- six  Anterior cervical decompression fusion;  Surgeon: Barnett Abu, MD;  Location: [facility];  Service: Neurosurgery;  Laterality: N/A;  C5-6 Anterior cervical decompression/diskectomy/fusion   BREAST BIOPSY Right 10/2018   x 2 benign   CARPAL TUNNEL RELEASE Right 2016   CATARACT EXTRACTION W/ INTRAOCULAR LENS  IMPLANT, BILATERAL  03/2015   COLONOSCOPY  2007   ESOPHAGOGASTRODUODENOSCOPY  2007   HEMORRHOID SURGERY  03/23/2011   Procedure: HEMORRHOIDECTOMY;  Surgeon: Clovis Pu. Cornett, MD;  Location: [facility];  Service: General;  Laterality: N/A;  lateral internal sphincterotomy and hemorrhoidectomy   LUMBAR FUSION  02/2015   L1 -- 2  PARTIAL KNEE ARTHROPLASTY Left 12/27/2017   Procedure: UNICOMPARTMENTAL LEFT KNEE;  Surgeon: Teryl Lucy, MD;  Location: [facility];  Service: Orthopedics;  Laterality: Left;   THORACIC DISCECTOMY N/A 12/15/2012   Procedure: Thoracic ten-eleven Thoracic laminectomy;  Surgeon: Barnett Abu, MD;  Location: [facility];  Service: Neurosurgery;  Laterality: N/A;  Thoracic ten-eleven Thoracic laminectomy   TUBAL LIGATION Bilateral 1991    Family History  Problem Relation Age of Onset   Diabetes Father    COPD Father    Cancer Maternal Grandfather        stomach    SOCIAL HX:   reports that she quit smoking about 18 years ago. Her smoking use included cigarettes. She has a 20.00 pack-year smoking  history. She has never used smokeless tobacco. She reports current alcohol use. She reports that she does not use drugs.   Current Outpatient Medications:    ALPRAZolam (XANAX) 0.5 MG tablet, TAKE 1 TABLET(0.5 MG) BY MOUTH THREE TIMES DAILY AS NEEDED FOR ANXIETY, Disp: 270 tablet, Rfl: 0   atenolol (TENORMIN) 25 MG tablet, TAKE 1 TABLET BY MOUTH DAILY, Disp: 100 tablet, Rfl: 2   atorvastatin (LIPITOR) 40 MG tablet, TAKE 1 TABLET BY MOUTH DAILY, Disp: 100 tablet, Rfl: 2   calcium citrate (CALCITRATE - DOSED IN MG ELEMENTAL CALCIUM) 950 (200 Ca) MG tablet, Take 200 mg of elemental calcium by mouth daily., Disp: , Rfl:    Carboxymethylcell-Hypromellose (GENTEAL OP), Apply to eye., Disp: , Rfl:    Carboxymethylcellulose Sodium (LUBRICANT EYE DROPS OP), Apply 2 drops to eye 4 (four) times daily as needed (dry eyes). , Disp: , Rfl:    citalopram (CELEXA) 40 MG tablet, TAKE 1 TABLET BY MOUTH DAILY, Disp: 90 tablet, Rfl: 0   estradiol (ESTRACE) 0.1 MG/GM vaginal cream, PLACE 1 APPLICATORFUL  VAGINALLY 3 TIMES A WEEK., Disp: 170 g, Rfl: 3   hydrocortisone 2.5 % cream, APPLY TO AFFECTED AREA(S)  TOPICALLY TWICE DAILY, Disp: 85.05 g, Rfl: 1   Lancets (ONETOUCH ULTRASOFT) lancets, Use daily, Disp: 100 each, Rfl: 6   levothyroxine (SYNTHROID) 125 MCG tablet, Take 1 tablet (125 mcg total) by mouth daily., Disp: 100 tablet, Rfl: 1   lisinopril (ZESTRIL) 10 MG tablet, TAKE 1 TABLET BY MOUTH DAILY, Disp: 100 tablet, Rfl: 1   meloxicam (MOBIC) 15 MG tablet, TAKE 1 TABLET BY MOUTH DAILY, Disp: 100 tablet, Rfl: 2   Misc Natural Products (TURMERIC CURCUMIN) CAPS, Take by mouth., Disp: , Rfl:    Misc. Devices (BARIATRIC ROLLATOR) MISC, 1 Rollator with basket., Disp: 1 each, Rfl: 0   Multiple Vitamin (MULTIVITAMIN) tablet, Take 1 tablet by mouth daily., Disp: , Rfl:    Omega-3 Fatty Acids (FISH OIL) 1200 MG CAPS, Take 1 capsule by mouth daily. , Disp: , Rfl:    ONE TOUCH ULTRA TEST test strip, USE 1 DAILY AS DIRECTED,  Disp: 100 each, Rfl: 3   pantoprazole (PROTONIX) 40 MG tablet, TAKE 1 TABLET BY MOUTH TWICE  DAILY BEFORE MEALS, Disp: 200 tablet, Rfl: 1   pioglitazone (ACTOS) 15 MG tablet, TAKE 1 TABLET BY MOUTH DAILY, Disp: 100 tablet, Rfl: 2   pramipexole (MIRAPEX) 0.125 MG tablet, Take 1 tablet (0.125 mg total) by mouth at bedtime., Disp: 30 tablet, Rfl: 2   pregabalin (LYRICA) 75 MG capsule, Take 1 capsule (75 mg total) by mouth 2 (two) times daily., Disp: 180 capsule, Rfl: 0   Semaglutide,0.25 or 0.5MG /DOS, (OZEMPIC, 0.25 OR 0.5 MG/DOSE,) 2 MG/3ML SOPN, Inject 0.5 mg  into the skin once a week., Disp: 3 mL, Rfl: 0   tiZANidine (ZANAFLEX) 4 MG tablet, TAKE 1 TABLET BY MOUTH 3 TIMES  DAILY AS NEEDED, Disp: 180 tablet, Rfl: 0   tobramycin (TOBREX) 0.3 % ophthalmic solution, Place 1 drop into both eyes every 6 (six) hours. Day before injection, day of injection and day after injection, Disp: , Rfl:    traZODone (DESYREL) 100 MG tablet, TAKE 1 TO 2 TABLETS BY MOUTH AT  BEDTIME, Disp: 180 tablet, Rfl: 3   Vitamin D, Ergocalciferol, (DRISDOL) 1.25 MG (50000 UNIT) CAPS capsule, TAKE 1 CAPSULE BY MOUTH  EVERY 7 DAYS FOR 12 DOSES, Disp: 12 capsule, Rfl: 3   HYDROcodone-acetaminophen (NORCO) 7.5-325 MG tablet, Take 1 tablet by mouth every 6 (six) hours as needed for moderate pain., Disp: 120 tablet, Rfl: 0   HYDROcodone-acetaminophen (NORCO) 7.5-325 MG tablet, Take 1 tablet by mouth every 6 (six) hours as needed for moderate pain., Disp: 120 tablet, Rfl: 0   HYDROcodone-acetaminophen (NORCO) 7.5-325 MG tablet, Take 1 tablet by mouth every 6 (six) hours as needed for moderate pain., Disp: 120 tablet, Rfl: 0  EXAM:   VITALS per patient if applicable: None reported  GENERAL: alert, oriented, appears well and in no acute distress  HEENT: atraumatic, conjunttiva clear, no obvious abnormalities on inspection of external nose and ears  NECK: normal movements of the head and neck  LUNGS: on inspection no signs of  respiratory distress, breathing rate appears normal, no obvious gross increased work of breathing, gasping or wheezing  CV: no obvious cyanosis  MS: moves all visible extremities without noticeable abnormality  PSYCH/NEURO: pleasant and cooperative, no obvious depression or anxiety, speech and thought processing grossly intact  ASSESSMENT AND PLAN:   Spinal stenosis, lumbar region, with neurogenic claudication   -PDMP reviewed, no red flags, overdose risk score is 180. -Refill hydrocodone 7.5/325 mg to take 1 tablet every 6 hours as needed for pain for total of 120 tablets a month x 3 months  Anxiety -On Xanax as needed.  Acquired hypothyroidism -Due for TSH recheck  Dyslipidemia -Due for lipid recheck  Essential hypertension -Due to schedule follow-up appointment to check blood pressure  Diabetes mellitus without complication (HCC)  -I think it is reasonable to start Ozempic at 0.5 mg daily.  Since her most recent A1c was already well-controlled at 6.1 I will have her discontinue metformin and start semaglutide.  Morbid obesity (HCC) -Discussed healthy lifestyle, including increased physical activity and better food choices to promote weight loss.  -I remain concerned about polypharmacy and we have discussed this several times and at today's visit as well.  We have discussed she has several sedating medications that could be contributing to lack of balance and potentially falls including hydrocodone, alprazolam, pramipexole, pregabalin, tizanidine.  She is very hesitant about decreasing hydrocodone due to her chronic pain.  She seems okay about potentially decreasing alprazolam.  She has a follow-up appointment with psychiatry soon to further discuss.  If no changes are made then, we will discuss a titration schedule at next visit.     I discussed the assessment and treatment plan with the patient. The patient was provided an opportunity to ask questions and all were answered.  The patient agreed with the plan and demonstrated an understanding of the instructions.   The patient was advised to call back or seek an in-person evaluation if the symptoms worsen or if the condition fails to improve as anticipated.    Katie Booker  Philip Aspen, MD  [facility]

## 2022-08-06 ENCOUNTER — Telehealth: Payer: Self-pay | Admitting: Internal Medicine

## 2022-08-06 NOTE — Telephone Encounter (Signed)
Kelli Psychiatrist from [facility] called in needing to speak to Dr concerning pt. She has some concerns she would like to share with the Dr and would like a call back at #[phone].  She will be seeing the pt again next week.   Please advise.

## 2022-08-09 ENCOUNTER — Encounter: Payer: Self-pay | Admitting: Internal Medicine

## 2022-08-27 ENCOUNTER — Other Ambulatory Visit: Payer: Self-pay | Admitting: Internal Medicine

## 2022-08-27 DIAGNOSIS — G8929 Other chronic pain: Secondary | ICD-10-CM

## 2022-08-27 DIAGNOSIS — M48062 Spinal stenosis, lumbar region with neurogenic claudication: Secondary | ICD-10-CM

## 2022-09-08 DIAGNOSIS — H43813 Vitreous degeneration, bilateral: Secondary | ICD-10-CM | POA: Diagnosis not present

## 2022-09-08 DIAGNOSIS — H35372 Puckering of macula, left eye: Secondary | ICD-10-CM | POA: Diagnosis not present

## 2022-09-08 DIAGNOSIS — H353123 Nonexudative age-related macular degeneration, left eye, advanced atrophic without subfoveal involvement: Secondary | ICD-10-CM | POA: Diagnosis not present

## 2022-09-08 DIAGNOSIS — E119 Type 2 diabetes mellitus without complications: Secondary | ICD-10-CM | POA: Diagnosis not present

## 2022-09-08 DIAGNOSIS — H353211 Exudative age-related macular degeneration, right eye, with active choroidal neovascularization: Secondary | ICD-10-CM | POA: Diagnosis not present

## 2022-09-11 ENCOUNTER — Other Ambulatory Visit: Payer: Self-pay | Admitting: Internal Medicine

## 2022-09-11 DIAGNOSIS — F419 Anxiety disorder, unspecified: Secondary | ICD-10-CM

## 2022-09-23 NOTE — Chronic Care Management (AMB) (Signed)
  Chronic Care Management   CCM RN Visit Note   Name: Katie Booker MRN: [id number] DOB: 04/13/1945  Subjective: Katie Booker is a 77 y.o. year old female who is a primary care patient of Philip Aspen, Limmie Patricia, MD. The patient was referred to the Chronic Care Management team for assistance with care management needs subsequent to provider initiation of CCM services and plan of care.    Today's Visit:  Engaged with patient by telephone for follow up visit.        Goals Addressed                         Goal: CCM (Hypertension) Expected Outcome:  Monitor, Self-Manage And Reduce Symptoms of Hypertension       Current Barriers:  Chronic Disease Management support and education needs related to Hypertension  Planned Interventions: Reviewed current treatment plan related to Hypertension, self-management, and adherence to plan as established by provider.  Reviewed medications and indications for use. Reports taking medications as prescribed. Reports tolerating regimen. Reports some episodes of short-term memory loss but reports managing medications well. Agreed to keep team update of concerns r/t medication management or prescription cost. Provided information regarding established blood pressure parameters along with indications for notifying a provider. Reports not monitoring consistently. Advised to try and monitor daily and record readings to identify trends. Reviewed symptoms. Denies chest pain or palpitations. Denies headaches, dizziness, or visual changes.  Discussed compliance with recommended cardiac prudent diet. Encouraged to read nutrition labels, continue monitoring sodium intake, and avoid highly processed foods when possible. Reports doing "ok" with meals. Primary concern is meal choices. Discussed possible benefits of grocery delivery and options for meal delivery. Agreed to update with decision. Will submit additional referrals if needed. Reviewed s/sx of heart  attack, stroke and worsening symptoms that require immediate medical attention. Update 04/07/22: Patient requested assistance with obtaining a Medical Alert device. Currently insured with [employer]. Per [facility] representative certain devices are available as an insurance benefit. Patient agreed to follow up to determine if she qualifies with her current insurance plan.  Will follow up next week and assist with conference call to her insurance provider if needed.  Update 04/14/22: Follow up regarding Medical Alert device. Patient reports that she will wait to obtain the device. Discussed options for using a phone with notification alerts versus the Medical Alert device available via her insurance plan. Agreed to contact the team if additional assistance is needed.   BP Readings from Last 3 Encounters:  03/26/22 (!) 145/69  10/19/21 138/78  09/14/21 (!) 150/86    Symptom Management: Take all medications as prescribed Attend all scheduled provider appointments Call pharmacy for medication refills 3-7 days in advance of running out of medications Check blood pressure  Keep a blood pressure log Call doctor for signs and symptoms of high blood pressure Read nutrition labels. Eat whole grains, fruits and vegetables, lean meats and healthy fats Limit salt intake  Call provider office for new concerns or questions             PLAN: A member of the care management team will follow up next month.    [facility]/Chronic Care Management [phone]

## 2022-10-05 ENCOUNTER — Telehealth: Payer: Self-pay | Admitting: Internal Medicine

## 2022-10-05 NOTE — Telephone Encounter (Signed)
Patient is aware 

## 2022-10-05 NOTE — Telephone Encounter (Signed)
FYI [facility]  with [facility] is calling just to notify md pregabalin (LYRICA) 75 MG capsule  was returned due to pt did not pick up from post office and they will resend medication out again . The reference number is [id number]

## 2022-10-07 ENCOUNTER — Ambulatory Visit
Admission: RE | Admit: 2022-10-07 | Discharge: 2022-10-07 | Disposition: A | Discharge status: Home / Self Care | Payer: Medicare Other | Source: Ambulatory Visit | Attending: Internal Medicine | Admitting: Internal Medicine

## 2022-10-07 DIAGNOSIS — E349 Endocrine disorder, unspecified: Secondary | ICD-10-CM | POA: Diagnosis not present

## 2022-10-07 DIAGNOSIS — Z13820 Encounter for screening for osteoporosis: Secondary | ICD-10-CM

## 2022-10-07 DIAGNOSIS — M8588 Other specified disorders of bone density and structure, other site: Secondary | ICD-10-CM | POA: Diagnosis not present

## 2022-10-18 ENCOUNTER — Other Ambulatory Visit: Payer: Self-pay | Admitting: Internal Medicine

## 2022-10-18 DIAGNOSIS — F419 Anxiety disorder, unspecified: Secondary | ICD-10-CM

## 2022-10-27 ENCOUNTER — Telehealth: Payer: Self-pay | Admitting: Internal Medicine

## 2022-10-27 NOTE — Telephone Encounter (Signed)
Seeking approval for Manufacturer change from [employer] to [facility]

## 2022-10-27 NOTE — Telephone Encounter (Signed)
Prescription Request  10/27/2022  LOV: 04/13/2022  What is the name of the medication or equipment? HYDROcodone-acetaminophen (NORCO) 7.5-325 MG tablet . Pt has an appt tomorrow  Which pharmacy would you like this sent to?  [id number] [place] Otto, [place] - [address] AT [facility] OF [facility] [address] [place] [address] Phone: [phone] Fax: [phone]     Patient notified that their request is being sent to the clinical staff for review and that they should receive a response within 2 business days.   Please advise at Mobile [phone] (mobile)

## 2022-10-28 ENCOUNTER — Other Ambulatory Visit: Payer: Self-pay | Admitting: *Deleted

## 2022-10-28 ENCOUNTER — Telehealth: Payer: Medicare Other | Admitting: Internal Medicine

## 2022-10-28 DIAGNOSIS — M48062 Spinal stenosis, lumbar region with neurogenic claudication: Secondary | ICD-10-CM

## 2022-10-28 MED ORDER — HYDROCODONE-ACETAMINOPHEN 7.5-325 MG PO TABS: 1.0000 | ORAL_TABLET | Freq: Four times a day (QID) | ORAL | 0 refills | Status: DC | PRN

## 2022-10-28 MED ORDER — HYDROCODONE-ACETAMINOPHEN 7.5-325 MG PO TABS
1.0000 | ORAL_TABLET | Freq: Four times a day (QID) | ORAL | 0 refills | Status: DC | PRN
Start: 2022-10-28 — End: 2023-01-10

## 2022-10-28 NOTE — Telephone Encounter (Signed)
Approval faxed and confirmed

## 2022-10-28 NOTE — Telephone Encounter (Signed)
Patient has an appointment 10/28/22

## 2022-11-01 ENCOUNTER — Telehealth: Payer: Self-pay | Admitting: Internal Medicine

## 2022-11-01 ENCOUNTER — Other Ambulatory Visit (INDEPENDENT_AMBULATORY_CARE_PROVIDER_SITE_OTHER): Payer: Medicare Other

## 2022-11-01 DIAGNOSIS — E785 Hyperlipidemia, unspecified: Secondary | ICD-10-CM

## 2022-11-01 LAB — LIPID PANEL
Cholesterol: 138 mg/dL (ref 0–200)
HDL: 70.5 mg/dL (ref >39.00)
LDL Cholesterol: 34 mg/dL (ref 0–99)
NonHDL: 67.4
Total CHOL/HDL Ratio: 2
Triglycerides: 166 mg/dL — ABNORMAL HIGH (ref 0.0–149.0)
VLDL: 33.2 mg/dL (ref 0.0–40.0)

## 2022-11-01 NOTE — Telephone Encounter (Signed)
Noted and medication list updated. 

## 2022-11-01 NOTE — Telephone Encounter (Signed)
Patient states that she wanted to update your medication information in her chart.  -Patient has recently stopped taking Trazadone. -Another Dr. she sees has reduced her Citalopram for 40 mgs to 20 mgs.

## 2022-11-16 LAB — HM DIABETES EYE EXAM

## 2022-11-17 DIAGNOSIS — H353211 Exudative age-related macular degeneration, right eye, with active choroidal neovascularization: Secondary | ICD-10-CM | POA: Diagnosis not present

## 2022-11-17 DIAGNOSIS — H35372 Puckering of macula, left eye: Secondary | ICD-10-CM | POA: Diagnosis not present

## 2022-11-17 DIAGNOSIS — E119 Type 2 diabetes mellitus without complications: Secondary | ICD-10-CM | POA: Diagnosis not present

## 2022-11-17 DIAGNOSIS — H353123 Nonexudative age-related macular degeneration, left eye, advanced atrophic without subfoveal involvement: Secondary | ICD-10-CM | POA: Diagnosis not present

## 2022-11-17 DIAGNOSIS — H43813 Vitreous degeneration, bilateral: Secondary | ICD-10-CM | POA: Diagnosis not present

## 2022-11-24 ENCOUNTER — Encounter: Payer: Self-pay | Admitting: Internal Medicine

## 2022-11-25 ENCOUNTER — Encounter (HOSPITAL_BASED_OUTPATIENT_CLINIC_OR_DEPARTMENT_OTHER): Payer: Self-pay | Admitting: Obstetrics & Gynecology

## 2022-11-25 ENCOUNTER — Ambulatory Visit (HOSPITAL_BASED_OUTPATIENT_CLINIC_OR_DEPARTMENT_OTHER): Payer: Medicare Other | Admitting: Obstetrics & Gynecology

## 2022-11-25 ENCOUNTER — Other Ambulatory Visit: Payer: Self-pay | Admitting: Internal Medicine

## 2022-11-25 VITALS — BP 150/78 | HR 85 | Ht 60.0 in | Wt 210.0 lb

## 2022-11-25 DIAGNOSIS — G8929 Other chronic pain: Secondary | ICD-10-CM | POA: Diagnosis not present

## 2022-11-25 DIAGNOSIS — M5489 Other dorsalgia: Secondary | ICD-10-CM | POA: Diagnosis not present

## 2022-11-25 DIAGNOSIS — N816 Rectocele: Secondary | ICD-10-CM | POA: Diagnosis not present

## 2022-11-25 DIAGNOSIS — R3911 Hesitancy of micturition: Secondary | ICD-10-CM

## 2022-11-25 MED ORDER — PANTOPRAZOLE SODIUM 40 MG PO TBEC: 40.0000 mg | DELAYED_RELEASE_TABLET | Freq: Two times a day (BID) | ORAL | 1 refills | Status: DC

## 2022-11-25 NOTE — Progress Notes (Signed)
77 y.o. Single White or Caucasian female here for new patient exam due to pelvic prolapse.  Reports this has been present for two to three years.  She feels the prolapse at times and feels this is really affecting her urine flow.  It takes awhile to get her stream started.  She does not have any urinary leakage unless she has held her urine too long.  She does have urgency.  She does not see blood in her urine.  She does not have any vaginal bleeding.    She went through menopause around age 61.  She did go on HRT for a few years.  She cannot remember why she stopped HRT.  Last mammogram was 2/20224.  She had breast biopsies done in 2020 showing benign findings.  Results are in EPIC and reviewed.  Never had any abnormal pap smears.  Is not SA.  No STI hx.    No LMP recorded. Patient is postmenopausal.             reports that she quit smoking about 18 years ago. Her smoking use included cigarettes. She started smoking about 38 years ago. She has a 20 pack-year smoking history. She has never used smokeless tobacco. She reports current alcohol use. She reports that she does not use drugs.  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   Depression    DOE (dyspnea on exertion)    GERD    Headache    Hemorrhoids    History of basal cell carcinoma excision    left cheek   History of colon polyps    History of hyperthyroidism 2011   RAI treatement   History of panic attacks    Hyperlipidemia    Hypertension    Hypothyroidism, postradioiodine therapy    followed by pcp   IDA (iron deficiency anemia)    intermittant   Insomnia    OA (osteoarthritis)    OSA (obstructive sleep apnea)    no cpap, per pt tried unable to tolerate    Peripheral neuropathy    legs and feet from hx back surgery's   Rash    bilateral axilla area -- per pt due to some personal wipes used other the counter   RLS (restless legs syndrome)    Spondylolisthesis at L1-L2 level    Tingling of both upper extremities    left greater than right due to cervical pinched nerve   Type 2 diabetes mellitus (HCC)    followed by pcp   Umbilical hernia    Urinary frequency    Urinary urgency     Past Surgical History:  Procedure Laterality Date   ANTERIOR CERVICAL DECOMP/DISCECTOMY FUSION N/A 09/02/2014   Procedure: Cervical five- six  Anterior cervical decompression fusion;  Surgeon: Barnett Abu, MD;  Location: [facility];  Service: Neurosurgery;  Laterality: N/A;  C5-6 Anterior cervical decompression/diskectomy/fusion   BREAST BIOPSY Right 10/2018   x 2 benign   CARPAL TUNNEL RELEASE Right 2016   CATARACT EXTRACTION W/ INTRAOCULAR LENS  IMPLANT, BILATERAL  03/2015   COLONOSCOPY  2007   ESOPHAGOGASTRODUODENOSCOPY  2007   HEMORRHOID SURGERY  03/23/2011  Procedure: HEMORRHOIDECTOMY;  Surgeon: Clovis Pu. Cornett, MD;  Location: [facility];  Service: General;  Laterality: N/A;  lateral internal sphincterotomy and hemorrhoidectomy   LUMBAR FUSION  02/2015   L1 -- 2   PARTIAL KNEE ARTHROPLASTY Left 12/27/2017   Procedure: UNICOMPARTMENTAL LEFT KNEE;  Surgeon: Teryl Lucy, MD;  Location: [facility];  Service: Orthopedics;  Laterality: Left;   THORACIC DISCECTOMY N/A 12/15/2012   Procedure: Thoracic ten-eleven Thoracic laminectomy;  Surgeon: Barnett Abu, MD;  Location: [facility];  Service: Neurosurgery;  Laterality: N/A;  Thoracic ten-eleven Thoracic laminectomy   TUBAL LIGATION Bilateral 1991    Current Outpatient Medications  Medication Sig Dispense Refill   ALPRAZolam (XANAX) 0.5 MG tablet TAKE 1 TABLET(0.5 MG) BY MOUTH THREE TIMES DAILY AS NEEDED FOR ANXIETY 90 tablet 0   atenolol (TENORMIN) 25 MG tablet TAKE 1 TABLET BY MOUTH  DAILY 100 tablet 2   atorvastatin (LIPITOR) 40 MG tablet TAKE 1 TABLET BY MOUTH DAILY 100 tablet 2   calcium citrate (CALCITRATE - DOSED IN MG ELEMENTAL CALCIUM) 950 (200 Ca) MG tablet Take 200 mg of elemental calcium by mouth daily.     Carboxymethylcell-Hypromellose (GENTEAL OP) Apply to eye.     Carboxymethylcellulose Sodium (LUBRICANT EYE DROPS OP) Apply 2 drops to eye 4 (four) times daily as needed (dry eyes).      citalopram (CELEXA) 20 MG tablet Take 20 mg by mouth daily.     HYDROcodone-acetaminophen (NORCO) 7.5-325 MG tablet Take 1 tablet by mouth every 6 (six) hours as needed for moderate pain. 120 tablet 0   HYDROcodone-acetaminophen (NORCO) 7.5-325 MG tablet Take 1 tablet by mouth every 6 (six) hours as needed for moderate pain. 120 tablet 0   HYDROcodone-acetaminophen (NORCO) 7.5-325 MG tablet Take 1 tablet by mouth every 6 (six) hours as needed for moderate pain. 120 tablet 0   hydrocortisone 2.5 % cream APPLY TO AFFECTED AREA(S)  TOPICALLY TWICE DAILY 113.4 g 0   Lancets (ONETOUCH ULTRASOFT) lancets Use daily 100 each 6   levothyroxine (SYNTHROID) 125 MCG tablet Take 1 tablet (125 mcg total) by mouth daily. 100 tablet 1   lisinopril (ZESTRIL) 10 MG tablet TAKE 1 TABLET BY MOUTH DAILY 100 tablet 1   meloxicam (MOBIC) 15 MG tablet TAKE 1 TABLET BY MOUTH DAILY 100 tablet 2   Misc. Devices (BARIATRIC ROLLATOR) MISC 1 Rollator with basket. 1 each 0   Multiple Vitamin (MULTIVITAMIN) tablet Take 1 tablet by mouth daily.     Omega-3 Fatty Acids (FISH OIL) 1200 MG CAPS Take 1 capsule by mouth daily.      ONE TOUCH ULTRA TEST test strip USE 1 DAILY AS DIRECTED 100 each 3   pantoprazole (PROTONIX) 40 MG tablet Take 1 tablet (40 mg total) by mouth 2 (two) times daily before a meal. 200 tablet 1   pioglitazone (ACTOS) 15 MG tablet TAKE 1 TABLET BY MOUTH DAILY 100 tablet 2   pramipexole (MIRAPEX) 0.125 MG tablet Take 1 tablet (0.125 mg total) by mouth at bedtime. 30 tablet 2   pregabalin  (LYRICA) 75 MG capsule TAKE 1 CAPSULE BY MOUTH TWICE  DAILY 180 capsule 0   tiZANidine (ZANAFLEX) 4 MG tablet TAKE 1 TABLET BY MOUTH 3 TIMES  DAILY AS NEEDED 180 tablet 0   tobramycin (TOBREX) 0.3 % ophthalmic solution Place 1 drop into both eyes every 6 (six) hours. Day before injection, day of injection and day after injection     estradiol (ESTRACE) 0.1 MG/GM vaginal  cream PLACE 1 APPLICATORFUL  VAGINALLY 3 TIMES A WEEK. (Patient not taking: Reported on 11/25/2022) 170 g 3   Misc Natural Products (TURMERIC CURCUMIN) CAPS Take by mouth. (Patient not taking: Reported on 11/25/2022)     Semaglutide,0.25 or 0.5MG /DOS, (OZEMPIC, 0.25 OR 0.5 MG/DOSE,) 2 MG/3ML SOPN Inject 0.5 mg into the skin once a week. (Patient not taking: Reported on 10/28/2022) 3 mL 0   Vitamin D, Ergocalciferol, (DRISDOL) 1.25 MG (50000 UNIT) CAPS capsule TAKE 1 CAPSULE BY MOUTH  EVERY 7 DAYS FOR 12 DOSES (Patient not taking: Reported on 11/25/2022) 12 capsule 3   No current facility-administered medications for this visit.    Family History  Problem Relation Age of Onset   Diabetes Father    COPD Father    Cancer Maternal Grandfather        stomach    Review of Systems  Constitutional: Negative.   Genitourinary:        Urinary hesitancy    Exam:   BP (!) 150/78 (BP Location: Right Arm, Patient Position: Sitting, Cuff Size: Large)   Pulse 85   Ht 5' (1.524 m)   Wt 210 lb (95.3 kg)   BMI 41.01 kg/m   Height: 5' (152.4 cm)  General appearance: alert, cooperative and appears stated age Lymph nodes: Cervical, supraclavicular, and axillary nodes normal.  No abnormal inguinal nodes palpated Neurologic: Grossly normal  Pelvic: External genitalia:  no lesions              Urethra:  normal appearing urethra with no masses, tenderness or lesions              Bartholins and Skenes: normal                 Vagina: normal appearing vagina with atrophic changes and no discharge, no lesions, rectocele present               Cervix: no lesions              Pap taken: No. Bimanual Exam:  Uterus:  normal size, contour, position, consistency, mobility, non-tender              Adnexa: no mass, fullness, tenderness               Rectovaginal: Confirms               Anus:  normal sphincter tone, no lesions  Discuss pelvic PT vs trial of pessary.  She opted for pessary placement.  Pt does have more gaping appearing introitus.  #2 and #3 incontinence ring with support place.  #2 was too small.  #3 came out with Valsalva maneuver.  #4 incontinence ring with support placed and she was immediately too uncomfortable so was removed without difficulty.  Some visible vaginal irritation present so discussed with pt she could possibly see some spotting or pink discharge over next few days.  She did tolerate pessary fitting well but could not tolerate the #4 size.  Chaperone, Raechel Ache, RN, was present for exam.  Assessment/Plan: 1. Urinary hesitancy - will check urine culture to r/o any chronic UTI, Urine Culture pending - will refer to urogyn for consideration of urodynamic testing to evaluation for any voiding dysfunction - Ambulatory referral to Urogynecology  2. Other chronic back pain  3. Rectocele - don't feel she is a great candidate for surgery at this time due to mobility  - pessary fitting was unsuccessful today  Total time with pt:  55 minutes including pessary fitting time

## 2022-11-27 LAB — URINE CULTURE

## 2022-11-30 ENCOUNTER — Other Ambulatory Visit: Payer: Self-pay | Admitting: Internal Medicine

## 2022-12-02 ENCOUNTER — Encounter: Payer: Self-pay | Admitting: Internal Medicine

## 2022-12-06 ENCOUNTER — Encounter: Payer: Self-pay | Admitting: Internal Medicine

## 2022-12-08 ENCOUNTER — Other Ambulatory Visit: Payer: Self-pay | Admitting: Internal Medicine

## 2022-12-08 DIAGNOSIS — F419 Anxiety disorder, unspecified: Secondary | ICD-10-CM

## 2022-12-08 MED ORDER — ALPRAZOLAM 0.5 MG PO TABS
0.5000 mg | ORAL_TABLET | Freq: Two times a day (BID) | ORAL | 0 refills | Status: DC | PRN
Start: 2022-12-08 — End: 2023-01-10

## 2022-12-13 ENCOUNTER — Other Ambulatory Visit: Payer: Self-pay | Admitting: Internal Medicine

## 2022-12-15 ENCOUNTER — Other Ambulatory Visit: Payer: Self-pay

## 2022-12-15 NOTE — Patient Instructions (Addendum)
Thank you for allowing the Care Management team to participate in your care. It was great speaking with you today!   As a reminder:  You are scheduled for a virtual visit with Dr. Ardyth Harps to discuss medications on December 27, 2022 at 1:30. You and I will touch base again via telephone on December 31, 2022 at 1:00.  Please do not hesitate to contact me if you require assistance prior to our scheduled outreach.   Katina [facility]  [facility], [facility] RN Care Manager Direct Dial: [phone] Website: [email or web address]

## 2022-12-15 NOTE — Patient Outreach (Unsigned)
Care Management   Visit Note  12/15/2022 Name: Katie Booker MRN: [id number] DOB: [birth year]  Subjective: Katie Booker is a 77 y.o. year old female who is a primary care patient of Philip Aspen, Limmie Patricia, MD. The Care Management team was consulted for assistance.      Engaged with patient via telephone. Her primary concern today is medication.   Assessment:  Outpatient Encounter Medications as of 12/15/2022  Medication Sig Note   metFORMIN (GLUCOPHAGE) 1000 MG tablet Take 1,000 mg by mouth 2 (two) times daily with a meal.    ALPRAZolam (XANAX) 0.5 MG tablet Take 1 tablet (0.5 mg total) by mouth 2 (two) times daily as needed for anxiety.    atenolol (TENORMIN) 25 MG tablet TAKE 1 TABLET BY MOUTH DAILY    atorvastatin (LIPITOR) 40 MG tablet TAKE 1 TABLET BY MOUTH DAILY    calcium citrate (CALCITRATE - DOSED IN MG ELEMENTAL CALCIUM) 950 (200 Ca) MG tablet Take 200 mg of elemental calcium by mouth daily.    Carboxymethylcell-Hypromellose (GENTEAL OP) Apply to eye.    Carboxymethylcellulose Sodium (LUBRICANT EYE DROPS OP) Apply 2 drops to eye 4 (four) times daily as needed (dry eyes).     citalopram (CELEXA) 20 MG tablet Take 20 mg by mouth daily.    estradiol (ESTRACE) 0.1 MG/GM vaginal cream PLACE 1 APPLICATORFUL  VAGINALLY 3 TIMES A WEEK. (Patient not taking: Reported on 11/25/2022)    HYDROcodone-acetaminophen (NORCO) 7.5-325 MG tablet Take 1 tablet by mouth every 6 (six) hours as needed for moderate pain.    HYDROcodone-acetaminophen (NORCO) 7.5-325 MG tablet Take 1 tablet by mouth every 6 (six) hours as needed for moderate pain.    HYDROcodone-acetaminophen (NORCO) 7.5-325 MG tablet Take 1 tablet by mouth every 6 (six) hours as needed for moderate pain. 12/15/2022: Needs new order   hydrocortisone 2.5 % cream APPLY TO AFFECTED AREA(S)  TOPICALLY TWICE DAILY    Lancets (ONETOUCH ULTRASOFT) lancets Use daily    levothyroxine (SYNTHROID) 125 MCG tablet Take 1 tablet (125 mcg  total) by mouth daily.    lisinopril (ZESTRIL) 10 MG tablet TAKE 1 TABLET BY MOUTH DAILY    meloxicam (MOBIC) 15 MG tablet TAKE 1 TABLET BY MOUTH DAILY    Misc Natural Products (TURMERIC CURCUMIN) CAPS Take by mouth. (Patient not taking: Reported on 11/25/2022)    Misc. Devices (BARIATRIC ROLLATOR) MISC 1 Rollator with basket.    Multiple Vitamin (MULTIVITAMIN) tablet Take 1 tablet by mouth daily.    Omega-3 Fatty Acids (FISH OIL) 1200 MG CAPS Take 1 capsule by mouth daily.     ONE TOUCH ULTRA TEST test strip USE 1 DAILY AS DIRECTED    pantoprazole (PROTONIX) 40 MG tablet Take 1 tablet (40 mg total) by mouth 2 (two) times daily before a meal.    pioglitazone (ACTOS) 15 MG tablet TAKE 1 TABLET BY MOUTH DAILY    pramipexole (MIRAPEX) 0.125 MG tablet Take 1 tablet (0.125 mg total) by mouth at bedtime. 12/15/2022: Different Dose: Reports currently taking 0.25 mg at 8 pm.   pregabalin (LYRICA) 75 MG capsule TAKE 1 CAPSULE BY MOUTH TWICE  DAILY    Semaglutide,0.25 or 0.5MG /DOS, (OZEMPIC, 0.25 OR 0.5 MG/DOSE,) 2 MG/3ML SOPN Inject 0.5 mg into the skin once a week. (Patient not taking: Reported on 10/28/2022) 12/15/2022: Pending: Reports she has not started taking this medication   tiZANidine (ZANAFLEX) 4 MG tablet TAKE 1 TABLET BY MOUTH 3 TIMES  DAILY AS NEEDED  tobramycin (TOBREX) 0.3 % ophthalmic solution Place 1 drop into both eyes every 6 (six) hours. Day before injection, day of injection and day after injection    Vitamin D, Ergocalciferol, (DRISDOL) 1.25 MG (50000 UNIT) CAPS capsule TAKE 1 CAPSULE BY MOUTH  EVERY 7 DAYS FOR 12 DOSES (Patient not taking: Reported on 11/25/2022)    No facility-administered encounter medications on file as of 12/15/2022.    Plan:

## 2022-12-18 ENCOUNTER — Other Ambulatory Visit: Payer: Self-pay | Admitting: Internal Medicine

## 2022-12-18 DIAGNOSIS — M48062 Spinal stenosis, lumbar region with neurogenic claudication: Secondary | ICD-10-CM

## 2022-12-27 ENCOUNTER — Telehealth: Payer: Medicare Other | Admitting: Internal Medicine

## 2022-12-31 ENCOUNTER — Other Ambulatory Visit: Payer: Self-pay

## 2022-12-31 ENCOUNTER — Other Ambulatory Visit: Payer: Self-pay | Admitting: Internal Medicine

## 2022-12-31 DIAGNOSIS — E039 Hypothyroidism, unspecified: Secondary | ICD-10-CM

## 2022-12-31 NOTE — Patient Outreach (Unsigned)
Care Management   Visit Note  12/31/2022 Name: Katie Booker MRN: [id number] DOB: [birth year]  Subjective: Katie Booker is a 77 y.o. year old female who is a primary care patient of Philip Aspen, Limmie Patricia, MD. The Care Management team was consulted for assistance.      Engaged with patient via telephone.  Assessment:  Outpatient Encounter Medications as of 12/31/2022  Medication Sig Note   Semaglutide,0.25 or 0.5MG /DOS, (OZEMPIC, 0.25 OR 0.5 MG/DOSE,) 2 MG/3ML SOPN Inject 0.5 mg into the skin once a week.    ALPRAZolam (XANAX) 0.5 MG tablet Take 1 tablet (0.5 mg total) by mouth 2 (two) times daily as needed for anxiety.    atenolol (TENORMIN) 25 MG tablet TAKE 1 TABLET BY MOUTH DAILY    atorvastatin (LIPITOR) 40 MG tablet TAKE 1 TABLET BY MOUTH DAILY    calcium citrate (CALCITRATE - DOSED IN MG ELEMENTAL CALCIUM) 950 (200 Ca) MG tablet Take 200 mg of elemental calcium by mouth daily.    Carboxymethylcell-Hypromellose (GENTEAL OP) Apply to eye.    Carboxymethylcellulose Sodium (LUBRICANT EYE DROPS OP) Apply 2 drops to eye 4 (four) times daily as needed (dry eyes).     citalopram (CELEXA) 20 MG tablet Take 20 mg by mouth daily.    estradiol (ESTRACE) 0.1 MG/GM vaginal cream PLACE 1 APPLICATORFUL  VAGINALLY 3 TIMES A WEEK. (Patient not taking: Reported on 11/25/2022)    HYDROcodone-acetaminophen (NORCO) 7.5-325 MG tablet Take 1 tablet by mouth every 6 (six) hours as needed for moderate pain.    HYDROcodone-acetaminophen (NORCO) 7.5-325 MG tablet Take 1 tablet by mouth every 6 (six) hours as needed for moderate pain.    HYDROcodone-acetaminophen (NORCO) 7.5-325 MG tablet Take 1 tablet by mouth every 6 (six) hours as needed for moderate pain. 12/15/2022: Needs new order   hydrocortisone 2.5 % cream APPLY TO AFFECTED AREA(S)  TOPICALLY TWICE DAILY    Lancets (ONETOUCH ULTRASOFT) lancets Use daily    levothyroxine (SYNTHROID) 125 MCG tablet Take 1 tablet (125 mcg total) by mouth daily.     lisinopril (ZESTRIL) 10 MG tablet TAKE 1 TABLET BY MOUTH DAILY    meloxicam (MOBIC) 15 MG tablet TAKE 1 TABLET BY MOUTH DAILY    metFORMIN (GLUCOPHAGE) 1000 MG tablet Take 1,000 mg by mouth 2 (two) times daily with a meal.    Misc Natural Products (TURMERIC CURCUMIN) CAPS Take by mouth. (Patient not taking: Reported on 11/25/2022)    Misc. Devices (BARIATRIC ROLLATOR) MISC 1 Rollator with basket.    Multiple Vitamin (MULTIVITAMIN) tablet Take 1 tablet by mouth daily.    Omega-3 Fatty Acids (FISH OIL) 1200 MG CAPS Take 1 capsule by mouth daily.     ONE TOUCH ULTRA TEST test strip USE 1 DAILY AS DIRECTED    pantoprazole (PROTONIX) 40 MG tablet Take 1 tablet (40 mg total) by mouth 2 (two) times daily before a meal.    pioglitazone (ACTOS) 15 MG tablet TAKE 1 TABLET BY MOUTH DAILY    pramipexole (MIRAPEX) 0.125 MG tablet Take 1 tablet (0.125 mg total) by mouth at bedtime. 12/15/2022: Different Dose: Reports currently taking 0.25 mg at 8 pm.   pregabalin (LYRICA) 75 MG capsule TAKE 1 CAPSULE BY MOUTH TWICE  DAILY    tiZANidine (ZANAFLEX) 4 MG tablet TAKE 1 TABLET BY MOUTH 3 TIMES  DAILY AS NEEDED    tobramycin (TOBREX) 0.3 % ophthalmic solution Place 1 drop into both eyes every 6 (six) hours. Day before injection, day of injection  and day after injection    Vitamin D, Ergocalciferol, (DRISDOL) 1.25 MG (50000 UNIT) CAPS capsule TAKE 1 CAPSULE BY MOUTH  EVERY 7 DAYS FOR 12 DOSES (Patient not taking: Reported on 11/25/2022)    No facility-administered encounter medications on file as of 12/31/2022.          Katina [facility]  [facility], [facility] RN Care Manager Direct Dial: [phone] Website: [email or web address]

## 2023-01-03 ENCOUNTER — Other Ambulatory Visit: Payer: Self-pay

## 2023-01-03 ENCOUNTER — Ambulatory Visit (INDEPENDENT_AMBULATORY_CARE_PROVIDER_SITE_OTHER): Payer: Medicare Other

## 2023-01-03 ENCOUNTER — Ambulatory Visit: Payer: Medicare Other | Admitting: Family Medicine

## 2023-01-03 VITALS — BP 140/80 | HR 87 | Ht 60.0 in | Wt 201.0 lb

## 2023-01-03 DIAGNOSIS — M5441 Lumbago with sciatica, right side: Secondary | ICD-10-CM | POA: Diagnosis not present

## 2023-01-03 DIAGNOSIS — M25551 Pain in right hip: Secondary | ICD-10-CM

## 2023-01-03 DIAGNOSIS — M4316 Spondylolisthesis, lumbar region: Secondary | ICD-10-CM | POA: Diagnosis not present

## 2023-01-03 DIAGNOSIS — M25559 Pain in unspecified hip: Secondary | ICD-10-CM

## 2023-01-03 DIAGNOSIS — Z981 Arthrodesis status: Secondary | ICD-10-CM | POA: Diagnosis not present

## 2023-01-03 DIAGNOSIS — G8929 Other chronic pain: Secondary | ICD-10-CM

## 2023-01-03 DIAGNOSIS — M160 Bilateral primary osteoarthritis of hip: Secondary | ICD-10-CM | POA: Diagnosis not present

## 2023-01-03 DIAGNOSIS — M545 Low back pain, unspecified: Secondary | ICD-10-CM | POA: Diagnosis not present

## 2023-01-03 NOTE — Progress Notes (Signed)
Rubin Payor, PhD, LAT, ATC acting as a scribe for [name], MD.  Katie Booker is a 77 y.o. female who presents to [facility] today for LBP. Pt was previously seen by Dr. Denyse Amass on 10/19/21 for L heel pain.  Today, pt c/o LBP that's chronic in nature, worsening a week ago. She thinks the exacerbation of her pain is related to doing a bunch of vacuuming. Hx of fusion L1-5. Pt locates pain to the very end of her low back in to buttock, mostly R-side. She also has pain along the lateral and anterior R thigh. She feels a loss of LE strength over the last few months. She was in such pain yesterday, that she feel onto her R hip.  She also c/o bilat knee pain. Hx of L partial knee replacement.  Radiating pain: yes- R thigh LE numbness/tingling: yes- R LE weakness: no Aggravates: worse at night Treatments tried: hydrocodone, muscle relaxer,   Pertinent review of systems: No fevers or chills  Relevant historical information: Hypertension and diabetes. Lumbar fusion L1-L5  Exam:  BP (!) 140/80   Pulse 87   Ht 5' (1.524 m)   Wt 201 lb (91.2 kg)   SpO2 96%   BMI 39.26 kg/m  General: Well Developed, well nourished, and in no acute distress.   MSK: L-spine nontender to palpation spinal midline. Tender palpation mild paraspinal musculature Decreased lumbar motion.  Right hip normal-appearing Tender palpation greater trochanter.  Decreased hip motion.  Antalgic gait.  Lower extremity strength is intact.   Lab and Radiology Results  Procedure: Real-time Ultrasound Guided Injection of right lateral hip greater trochanter bursa Device: Philips Affiniti 50G/GE Logiq Images permanently stored and available for review in PACS Verbal informed consent obtained.  Discussed risks and benefits of procedure. Warned about infection, bleeding, hyperglycemia damage to structures among others. Patient expresses understanding and agreement Time-out  conducted.   Noted no overlying erythema, induration, or other signs of local infection.   Skin prepped in a sterile fashion.   Local anesthesia: Topical Ethyl chloride.   With sterile technique and under real time ultrasound guidance: 40 mg of Kenalog and 2 ml of Marcaine injected into greater trochanter bursa. Fluid seen entering the bursa.   Completed without difficulty   Pain immediately resolved suggesting accurate placement of the medication.   Advised to call if fevers/chills, erythema, induration, drainage, or persistent bleeding.   Images permanently stored and available for review in the ultrasound unit.  Impression: Technically successful ultrasound guided injection.    X-ray images lumbar spine and right hip obtained today personally and independently interpreted  Lumbar spine: Extensive posterior lumbar fusion.  Degenerative changes present at T12-L1 and at L5-S1.  Multilevel degenerative changes present in the thoracic spine.  There is no definitive compression fracture but the thoracic spine is so degenerative it is hard to tell for sure.  Comparison of x-rays from 2018 support chronic degenerative changes versus acute fracture.    Right hip: No acute fractures are visible.  Mild right hip DJD.  Await formal radiology review    Assessment and Plan: 77 y.o. female with acute exacerbation of chronic right low back pain and right lateral hip pain thought to be muscle dysfunction and greater trochanteric bursitis.  Plan for greater trochanter bursa injection as above and limited physical therapy.  I do not think that she has any acute vertebral compression fracture to cause her pain.  Her pain is present  lower down in her lumbar spine near the L5-S1 area not in her thoracic spine.  PDMP not reviewed this encounter. Orders Placed This Encounter  Procedures   DG Lumbar Spine 2-3 Views    Standing Status:   Future    Number of Occurrences:   1    Standing Expiration Date:    02/02/2023    Order Specific Question:   Reason for Exam (SYMPTOM  OR DIAGNOSIS REQUIRED)    Answer:   low back pain    Order Specific Question:   Preferred imaging location?    Answer:   [facility]   [place] LIMITED JOINT SPACE STRUCTURES LOW RIGHT(NO LINKED CHARGES)    Order Specific Question:   Reason for Exam (SYMPTOM  OR DIAGNOSIS REQUIRED)    Answer:   right hip pain    Order Specific Question:   Preferred imaging location?    Answer:   [facility]   DG HIP UNILAT W OR W/O PELVIS 2-3 VIEWS RIGHT    Standing Status:   Future    Number of Occurrences:   1    Standing Expiration Date:   02/02/2023    Order Specific Question:   Reason for Exam (SYMPTOM  OR DIAGNOSIS REQUIRED)    Answer:   right hip pain    Order Specific Question:   Preferred imaging location?    Answer:   [facility]   Ambulatory referral to Physical Therapy    Referral Priority:   Routine    Referral Type:   Physical Medicine    Referral Reason:   Specialty Services Required    Requested Specialty:   Physical Therapy    Number of Visits Requested:   1   No orders of the defined types were placed in this encounter.    Discussed warning signs or symptoms. Please see discharge instructions. Patient expresses understanding.   The above documentation has been reviewed and is accurate and complete [name], M.D.

## 2023-01-03 NOTE — Patient Instructions (Addendum)
Thank you for coming in today.   Please get an Xray today before you leave   I've referred you to Physical Therapy.  Let us know if you don't hear from them in one week.   You received an injection today. Seek immediate medical attention if the joint becomes red, extremely painful, or is oozing fluid.   Check back in 6 weeks 

## 2023-01-10 ENCOUNTER — Encounter: Payer: Self-pay | Admitting: Internal Medicine

## 2023-01-10 ENCOUNTER — Ambulatory Visit (INDEPENDENT_AMBULATORY_CARE_PROVIDER_SITE_OTHER): Payer: Medicare Other | Admitting: Internal Medicine

## 2023-01-10 VITALS — BP 120/80 | HR 85 | Temp 98.0°F | Wt 195.9 lb

## 2023-01-10 DIAGNOSIS — E119 Type 2 diabetes mellitus without complications: Secondary | ICD-10-CM

## 2023-01-10 DIAGNOSIS — L309 Dermatitis, unspecified: Secondary | ICD-10-CM | POA: Diagnosis not present

## 2023-01-10 DIAGNOSIS — M48062 Spinal stenosis, lumbar region with neurogenic claudication: Secondary | ICD-10-CM | POA: Diagnosis not present

## 2023-01-10 DIAGNOSIS — E559 Vitamin D deficiency, unspecified: Secondary | ICD-10-CM | POA: Diagnosis not present

## 2023-01-10 DIAGNOSIS — Z23 Encounter for immunization: Secondary | ICD-10-CM

## 2023-01-10 DIAGNOSIS — Z7985 Long-term (current) use of injectable non-insulin antidiabetic drugs: Secondary | ICD-10-CM

## 2023-01-10 DIAGNOSIS — F419 Anxiety disorder, unspecified: Secondary | ICD-10-CM

## 2023-01-10 DIAGNOSIS — M7021 Olecranon bursitis, right elbow: Secondary | ICD-10-CM | POA: Diagnosis not present

## 2023-01-10 LAB — POCT GLYCOSYLATED HEMOGLOBIN (HGB A1C): HbA1c POC (<> result, manual entry): 5.5 % (ref 4.0–5.6)

## 2023-01-10 MED ORDER — HYDROCODONE-ACETAMINOPHEN 7.5-325 MG PO TABS: 1.0000 | ORAL_TABLET | Freq: Four times a day (QID) | ORAL | 0 refills | Status: DC | PRN

## 2023-01-10 MED ORDER — TRIAMCINOLONE ACETONIDE 0.1 % EX CREA: 1.0000 | TOPICAL_CREAM | Freq: Two times a day (BID) | CUTANEOUS | 0 refills | Status: DC

## 2023-01-10 MED ORDER — ALPRAZOLAM 0.5 MG PO TABS: 0.5000 mg | ORAL_TABLET | Freq: Two times a day (BID) | ORAL | 2 refills | Status: DC | PRN

## 2023-01-10 NOTE — Addendum Note (Signed)
Addended by: Kern Reap B on: 01/10/2023 03:53 PM   Modules accepted: Orders

## 2023-01-10 NOTE — Progress Notes (Signed)
Established Patient Office Visit     CC/Reason for Visit: Medication refills, discuss acute concerns  HPI: Katie Booker is a 77 y.o. female who is coming in today for the above mentioned reasons. Past Medical History is significant for: Spinal stenosis of cervical, thoracic, lumbar spine followed by Dr. Danielle Dess and on chronic hydrocodone with the pain contract through me, anxiety on Xanax, insomnia on trazodone, diabetes, hypertension, hyperlipidemia, hypothyroidism.  She also has a history of restless leg syndrome and has started seeing neurology for this.  She has a uterine prolapse and has an upcoming appointment with GYN.  She just started right eye injections for her macular degeneration.  She suffered a left Achilles tendon tear and surgery is apparently not an option.  She needs refills of her alprazolam and hydrocodone.  She would also like to discuss a swelling of her right elbow and a rash on both forearms.  She describes spending a lot of the day resting her elbows and forearms on hard surfaces due to lack of balance.  Requesting flu vaccine.   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   Depression    DOE (dyspnea on exertion)    GERD    Headache    Hemorrhoids    History of basal cell carcinoma excision    left cheek   History of colon polyps    History of hyperthyroidism 2011   RAI treatement   History of panic attacks    Hyperlipidemia    Hypertension    Hypothyroidism, postradioiodine therapy    followed by pcp   IDA (iron deficiency anemia)    intermittant   Insomnia    OA (osteoarthritis)    OSA (obstructive sleep apnea)    no cpap, per pt tried unable to  tolerate   Peripheral neuropathy    legs and feet from hx back surgery's   Rash    bilateral axilla area -- per pt due to some personal wipes used other the counter   RLS (restless legs syndrome)    Spondylolisthesis at L1-L2 level    Tingling of both upper extremities    left greater than right due to cervical pinched nerve   Type 2 diabetes mellitus (HCC)    followed by pcp   Umbilical hernia    Urinary frequency    Urinary urgency     Past Surgical History:  Procedure Laterality Date   ANTERIOR CERVICAL DECOMP/DISCECTOMY FUSION N/A 09/02/2014   Procedure: Cervical five- six  Anterior cervical decompression fusion;  Surgeon: Barnett Abu, MD;  Location: [facility];  Service: Neurosurgery;  Laterality: N/A;  C5-6 Anterior cervical decompression/diskectomy/fusion   BREAST BIOPSY Right 10/2018   x 2 benign   CARPAL TUNNEL RELEASE Right 2016   CATARACT EXTRACTION W/ INTRAOCULAR LENS  IMPLANT, BILATERAL  03/2015   COLONOSCOPY  2007   ESOPHAGOGASTRODUODENOSCOPY  2007   HEMORRHOID SURGERY  03/23/2011   Procedure: HEMORRHOIDECTOMY;  Surgeon: Clovis Pu. Cornett, MD;  Location: [facility];  Service: General;  Laterality: N/A;  lateral internal sphincterotomy and hemorrhoidectomy   LUMBAR FUSION  02/2015  L1 -- 2   PARTIAL KNEE ARTHROPLASTY Left 12/27/2017   Procedure: UNICOMPARTMENTAL LEFT KNEE;  Surgeon: Teryl Lucy, MD;  Location: [facility];  Service: Orthopedics;  Laterality: Left;   THORACIC DISCECTOMY N/A 12/15/2012   Procedure: Thoracic ten-eleven Thoracic laminectomy;  Surgeon: Barnett Abu, MD;  Location: [facility];  Service: Neurosurgery;  Laterality: N/A;  Thoracic ten-eleven Thoracic laminectomy   TUBAL LIGATION Bilateral 1991    Social History:  reports that she quit smoking about 18 years ago. Her smoking use included cigarettes. She started smoking about 38 years ago. She has a 20 pack-year smoking history. She has never used smokeless tobacco. She reports  current alcohol use. She reports that she does not use drugs.  Allergies: Allergies  Allergen Reactions   Duraprep [Antiseptic Products, Misc.] Itching and Rash    Irritation everywhere prep was used   Advil Allergy Sinus [Chlorpheniramine-Pse-Ibuprofen]     Nervousness    Betadine [Povidone Iodine] Other (See Comments)    Burning sensation.    Hydromorphone Hcl Other (See Comments)    Makes crazy   Morphine And Codeine Other (See Comments)    hallucinations    Family History:  Family History  Problem Relation Age of Onset   Diabetes Father    COPD Father    Cancer Maternal Grandfather        stomach     Current Outpatient Medications:    atenolol (TENORMIN) 25 MG tablet, TAKE 1 TABLET BY MOUTH DAILY, Disp: 100 tablet, Rfl: 2   atorvastatin (LIPITOR) 40 MG tablet, TAKE 1 TABLET BY MOUTH DAILY, Disp: 100 tablet, Rfl: 2   calcium citrate (CALCITRATE - DOSED IN MG ELEMENTAL CALCIUM) 950 (200 Ca) MG tablet, Take 200 mg of elemental calcium by mouth daily., Disp: , Rfl:    Carboxymethylcell-Hypromellose (GENTEAL OP), Apply to eye., Disp: , Rfl:    Carboxymethylcellulose Sodium (LUBRICANT EYE DROPS OP), Apply 2 drops to eye 4 (four) times daily as needed (dry eyes). , Disp: , Rfl:    hydrocortisone 2.5 % cream, APPLY TO AFFECTED AREA(S)  TOPICALLY TWICE DAILY, Disp: 113.4 g, Rfl: 0   Lancets (ONETOUCH ULTRASOFT) lancets, Use daily, Disp: 100 each, Rfl: 6   levothyroxine (SYNTHROID) 125 MCG tablet, TAKE 1 TABLET BY MOUTH DAILY, Disp: 100 tablet, Rfl: 0   lisinopril (ZESTRIL) 10 MG tablet, TAKE 1 TABLET BY MOUTH DAILY, Disp: 100 tablet, Rfl: 1   meloxicam (MOBIC) 15 MG tablet, TAKE 1 TABLET BY MOUTH DAILY, Disp: 100 tablet, Rfl: 2   metFORMIN (GLUCOPHAGE) 1000 MG tablet, Take 1,000 mg by mouth 2 (two) times daily with a meal., Disp: , Rfl:    Misc Natural Products (TURMERIC CURCUMIN) CAPS, Take by mouth., Disp: , Rfl:    Misc. Devices (BARIATRIC ROLLATOR) MISC, 1 Rollator with  basket., Disp: 1 each, Rfl: 0   Multiple Vitamin (MULTIVITAMIN) tablet, Take 1 tablet by mouth daily., Disp: , Rfl:    Omega-3 Fatty Acids (FISH OIL) 1200 MG CAPS, Take 1 capsule by mouth daily. , Disp: , Rfl:    ONE TOUCH ULTRA TEST test strip, USE 1 DAILY AS DIRECTED, Disp: 100 each, Rfl: 3   pantoprazole (PROTONIX) 40 MG tablet, Take 1 tablet (40 mg total) by mouth 2 (two) times daily before a meal., Disp: 200 tablet, Rfl: 1   pioglitazone (ACTOS) 15 MG tablet, TAKE 1 TABLET BY MOUTH DAILY, Disp: 100 tablet, Rfl: 2   pramipexole (MIRAPEX) 0.125 MG tablet, Take 1 tablet (0.125 mg total)  by mouth at bedtime., Disp: 30 tablet, Rfl: 2   pregabalin (LYRICA) 75 MG capsule, TAKE 1 CAPSULE BY MOUTH TWICE  DAILY, Disp: 180 capsule, Rfl: 0   Semaglutide,0.25 or 0.5MG /DOS, (OZEMPIC, 0.25 OR 0.5 MG/DOSE,) 2 MG/3ML SOPN, Inject 0.5 mg into the skin once a week., Disp: 3 mL, Rfl: 0   tiZANidine (ZANAFLEX) 4 MG tablet, TAKE 1 TABLET BY MOUTH 3 TIMES  DAILY AS NEEDED, Disp: 180 tablet, Rfl: 0   tobramycin (TOBREX) 0.3 % ophthalmic solution, Place 1 drop into both eyes every 6 (six) hours. Day before injection, day of injection and day after injection, Disp: , Rfl:    triamcinolone cream (KENALOG) 0.1 %, Apply 1 Application topically 2 (two) times daily., Disp: 80 g, Rfl: 0   Vitamin D, Ergocalciferol, (DRISDOL) 1.25 MG (50000 UNIT) CAPS capsule, TAKE 1 CAPSULE BY MOUTH  EVERY 7 DAYS FOR 12 DOSES, Disp: 12 capsule, Rfl: 3   vortioxetine HBr (TRINTELLIX) 5 MG TABS tablet, Take 5 mg by mouth daily., Disp: , Rfl:    ALPRAZolam (XANAX) 0.5 MG tablet, Take 1 tablet (0.5 mg total) by mouth 2 (two) times daily as needed for anxiety., Disp: 60 tablet, Rfl: 2   HYDROcodone-acetaminophen (NORCO) 7.5-325 MG tablet, Take 1 tablet by mouth every 6 (six) hours as needed for moderate pain (pain score 4-6)., Disp: 120 tablet, Rfl: 0   HYDROcodone-acetaminophen (NORCO) 7.5-325 MG tablet, Take 1 tablet by mouth every 6 (six) hours  as needed for moderate pain (pain score 4-6)., Disp: 120 tablet, Rfl: 0   HYDROcodone-acetaminophen (NORCO) 7.5-325 MG tablet, Take 1 tablet by mouth every 6 (six) hours as needed for moderate pain (pain score 4-6)., Disp: 120 tablet, Rfl: 0  Review of Systems:  Negative unless indicated in HPI.   Physical Exam: Vitals:   01/10/23 1343  BP: 120/80  Pulse: 85  Temp: 98 F (36.7 C)  TempSrc: Oral  SpO2: 97%  Weight: 195 lb 14.4 oz (88.9 kg)    Body mass index is 38.26 kg/m.   Physical Exam Musculoskeletal:     Right elbow: Swelling present.     Comments: Eczema over dorsal surface of both forearms      Impression and Plan:  Vitamin D deficiency -     VITAMIN D 25 Hydroxy (Vit-D Deficiency, Fractures); Future  Anxiety -     ALPRAZolam; Take 1 tablet (0.5 mg total) by mouth 2 (two) times daily as needed for anxiety.  Dispense: 60 tablet; Refill: 2  Spinal stenosis, lumbar region, with neurogenic claudication -     HYDROcodone-Acetaminophen; Take 1 tablet by mouth every 6 (six) hours as needed for moderate pain (pain score 4-6).  Dispense: 120 tablet; Refill: 0 -     HYDROcodone-Acetaminophen; Take 1 tablet by mouth every 6 (six) hours as needed for moderate pain (pain score 4-6).  Dispense: 120 tablet; Refill: 0 -     HYDROcodone-Acetaminophen; Take 1 tablet by mouth every 6 (six) hours as needed for moderate pain (pain score 4-6).  Dispense: 120 tablet; Refill: 0  Diabetes mellitus without complication (HCC) -     POCT glycosylated hemoglobin (Hb A1C)  Olecranon bursitis of right elbow  Eczema, unspecified type -     Triamcinolone Acetonide; Apply 1 Application topically 2 (two) times daily.  Dispense: 80 g; Refill: 0  Immunization due   -Flu vaccine administered in office today. -Triamcinolone cream prescribed for forearm eczema. -She has olecranon bursitis on the right, padded elbow braces and icing  advised. -A1c of 5.5 demonstrates excellent diabetic  management.  She has lost about 15 pounds since starting Ozempic. -PDMP reviewed, no red flags, overdose risk score is 160.  Will refill her hydrocodone per pain contract that is up-to-date as well as her alprazolam.  Time spent:33 minutes reviewing chart, interviewing and examining patient and formulating plan of care.     Chaya Jan, MD [facility]

## 2023-01-11 MED ORDER — OZEMPIC (0.25 OR 0.5 MG/DOSE) 2 MG/3ML ~~LOC~~ SOPN: 0.5000 mg | PEN_INJECTOR | SUBCUTANEOUS | 0 refills | Status: DC

## 2023-01-13 ENCOUNTER — Other Ambulatory Visit: Payer: Self-pay

## 2023-01-13 ENCOUNTER — Telehealth: Payer: Self-pay

## 2023-01-13 NOTE — Patient Outreach (Signed)
  Care Management   Outreach Note  01/13/2023 Name: Katie Booker MRN: [id number] DOB: 03/26/1945  An unsuccessful outreach attempt was made today for a scheduled Care Management visit.   Follow Up Plan:  A HIPAA compliant phone message was left for the patient providing contact information and requesting a return call.   [facility]  [facility] RN Care Manager Direct Dial: [phone] Website: [email or web address]

## 2023-01-17 ENCOUNTER — Encounter: Payer: Self-pay | Admitting: Internal Medicine

## 2023-01-22 ENCOUNTER — Other Ambulatory Visit: Payer: Self-pay | Admitting: Internal Medicine

## 2023-01-22 DIAGNOSIS — M5489 Other dorsalgia: Secondary | ICD-10-CM

## 2023-01-24 NOTE — Progress Notes (Signed)
Low back x-ray shows where you had surgery from L1-L5.  You have a fair amount of arthritis at L5-S1 below the area of fusion.

## 2023-01-24 NOTE — Progress Notes (Signed)
Right hip x-ray shows medium arthritis of both hips.

## 2023-01-26 DIAGNOSIS — H35372 Puckering of macula, left eye: Secondary | ICD-10-CM | POA: Diagnosis not present

## 2023-01-26 DIAGNOSIS — E119 Type 2 diabetes mellitus without complications: Secondary | ICD-10-CM | POA: Diagnosis not present

## 2023-01-26 DIAGNOSIS — H353211 Exudative age-related macular degeneration, right eye, with active choroidal neovascularization: Secondary | ICD-10-CM | POA: Diagnosis not present

## 2023-01-26 DIAGNOSIS — H43813 Vitreous degeneration, bilateral: Secondary | ICD-10-CM | POA: Diagnosis not present

## 2023-01-26 DIAGNOSIS — H353123 Nonexudative age-related macular degeneration, left eye, advanced atrophic without subfoveal involvement: Secondary | ICD-10-CM | POA: Diagnosis not present

## 2023-02-05 ENCOUNTER — Other Ambulatory Visit: Payer: Self-pay | Admitting: Internal Medicine

## 2023-02-09 ENCOUNTER — Other Ambulatory Visit: Payer: Self-pay | Admitting: Internal Medicine

## 2023-02-10 ENCOUNTER — Ambulatory Visit: Payer: Medicare Other | Admitting: Family Medicine

## 2023-02-10 ENCOUNTER — Other Ambulatory Visit: Payer: Self-pay | Admitting: Internal Medicine

## 2023-02-10 DIAGNOSIS — M48062 Spinal stenosis, lumbar region with neurogenic claudication: Secondary | ICD-10-CM

## 2023-02-21 ENCOUNTER — Ambulatory Visit (INDEPENDENT_AMBULATORY_CARE_PROVIDER_SITE_OTHER): Payer: Medicare Other | Admitting: Family Medicine

## 2023-02-21 ENCOUNTER — Encounter: Payer: Self-pay | Admitting: Family Medicine

## 2023-02-21 VITALS — BP 154/88 | HR 93 | Ht 60.0 in | Wt 195.0 lb

## 2023-02-21 DIAGNOSIS — G8929 Other chronic pain: Secondary | ICD-10-CM | POA: Diagnosis not present

## 2023-02-21 DIAGNOSIS — M5441 Lumbago with sciatica, right side: Secondary | ICD-10-CM

## 2023-02-21 DIAGNOSIS — M25551 Pain in right hip: Secondary | ICD-10-CM

## 2023-02-21 NOTE — Progress Notes (Signed)
Katie Payor, PhD, LAT, ATC acting as a scribe for Katie Graham, MD.  Katie Booker is a 77 y.o. female who presents to [facility] today for f/u LBP. Pt was last seen by Dr. Denyse Amass on 01/03/23 and was given a R GT steroid injection and was referred to PT, but never scheduled any visits.  Today, pt reports she didn't go to PT do to cost. She notes her LBP is terrible and minimal/short-lived relief from prior R GT steroid injection. Pt suffered 2 falls on 12/10; fell out of chair after falling asleep and falling out of bed. She locates pain to her R groin and slightly R-side of her low back, below where she had a fusion.   Dx imaging: 01/03/23 R hip & L-spine XR  Pertinent review of systems: No fevers or chills  Relevant historical information: Posterior lumbar fusion L1-L5 Diabetes  Exam:  BP (!) 154/88   Pulse 93   Ht 5' (1.524 m)   Wt 195 lb (88.5 kg)   SpO2 96%   BMI 38.08 kg/m  General: Well Developed, well nourished, and in no acute distress.   MSK: L-spine nontender midline.  Tender palpation right lumbar paraspinal musculature. Decreased lumbar motion.  Lower extremity strength is intact.    Lab and Radiology Results  EXAM: DG HIP (WITH OR WITHOUT PELVIS) 2-3V RIGHT   COMPARISON:  CT abdomen and pelvis 09/25/2013   FINDINGS: No acute fracture or dislocation. Mild-moderate degenerative arthritis both hips. Partially visualized lumbar fusion.   IMPRESSION: No acute fracture or dislocation. Mild-moderate degenerative arthritis both hips.     Electronically Signed   By: Minerva Fester M.D.   On: 01/20/2023 23:50  EXAM: LUMBAR SPINE - 2-3 VIEW   COMPARISON:  Lumbar spine radiographs 03/04/2016. And MRI 08/14/2018   FINDINGS: Posterior fusion with interbody spacers from L1-L5. No radiographic evidence of loosening. No evidence of acute fracture or traumatic listhesis. Grade 1 anterolisthesis of L5 is unchanged.  Advanced spondylosis and disc space height loss above and below the fused segment.   IMPRESSION: 1. No acute fracture or traumatic listhesis. 2. Posterior fusion from L1-L5.     Electronically Signed   By: Minerva Fester M.D.   On: 01/20/2023 23:49   I, Katie Booker, personally (independently) visualized and performed the interpretation of the images attached in this note.   Assessment and Plan: 77 y.o. female with chronic right-sided low back pain and right hip pain.  Pain is multifactorial.  Differential includes L1 and L5 lumbar radiculopathy, facet arthritis, and hip pain from arthritis.  She may in fact have all of this or part of this.  Plan for advanced lumbar imaging to clarify for lumbar radiculopathy and facet arthritis related pain.  MRI will not be helpful given the amount of metal in her back.  CT myelogram lumbar spine is the next diagnostic step.  Recheck after we get the results of this test back.   PDMP not reviewed this encounter. Orders Placed This Encounter  Procedures   CT LUMBAR SPINE W CONTRAST    Standing Status:   Future    Expiration Date:   02/21/2024    If indicated for the ordered procedure, I authorize the administration of contrast media per Radiology protocol:   Yes    Does the patient have a contrast media/X-ray dye allergy?:   No    Preferred imaging location?:   [facility]   DG MYELOGRAPHY LUMBAR  INJ LUMBOSACRAL    Standing Status:   Future    Expiration Date:   02/21/2024    Reason for Exam (SYMPTOM  OR DIAGNOSIS REQUIRED):   low back pain    Preferred Imaging Location?:   [facility]   No orders of the defined types were placed in this encounter.    Discussed warning signs or symptoms. Please see discharge instructions. Patient expresses understanding.   The above documentation has been reviewed and is accurate and complete Katie Booker, M.D.

## 2023-02-21 NOTE — Patient Instructions (Addendum)
Thank you for coming in today.   You should hear about scheduling your CT-Myelogram soon. If you don't hear anything within about a week, please let me know.

## 2023-02-22 ENCOUNTER — Ambulatory Visit (INDEPENDENT_AMBULATORY_CARE_PROVIDER_SITE_OTHER): Payer: Medicare Other | Admitting: Obstetrics and Gynecology

## 2023-02-22 ENCOUNTER — Encounter: Payer: Self-pay | Admitting: Obstetrics and Gynecology

## 2023-02-22 ENCOUNTER — Ambulatory Visit: Payer: Medicare Other | Admitting: Obstetrics and Gynecology

## 2023-02-22 VITALS — BP 133/69 | HR 88 | Ht 59.0 in | Wt 177.8 lb

## 2023-02-22 DIAGNOSIS — N816 Rectocele: Secondary | ICD-10-CM | POA: Diagnosis not present

## 2023-02-22 DIAGNOSIS — N952 Postmenopausal atrophic vaginitis: Secondary | ICD-10-CM | POA: Diagnosis not present

## 2023-02-22 DIAGNOSIS — N8110 Cystocele, unspecified: Secondary | ICD-10-CM | POA: Diagnosis not present

## 2023-02-22 DIAGNOSIS — R350 Frequency of micturition: Secondary | ICD-10-CM | POA: Diagnosis not present

## 2023-02-22 DIAGNOSIS — N3281 Overactive bladder: Secondary | ICD-10-CM | POA: Diagnosis not present

## 2023-02-22 LAB — POCT URINALYSIS DIPSTICK
Bilirubin, UA: NEGATIVE
Blood, UA: NEGATIVE
Glucose, UA: NEGATIVE
Ketones, UA: NEGATIVE
Nitrite, UA: NEGATIVE
Protein, UA: NEGATIVE
Spec Grav, UA: 1.015 (ref 1.010–1.025)
Urobilinogen, UA: 0.2 U/dL
pH, UA: 7 (ref 5.0–8.0)

## 2023-02-22 MED ORDER — ESTRADIOL 0.1 MG/GM VA CREA: 0.5000 g | TOPICAL_CREAM | VAGINAL | 11 refills | Status: AC

## 2023-02-22 MED ORDER — VIBEGRON 75 MG PO TABS: 75.0000 mg | ORAL_TABLET | Freq: Every day | ORAL | 5 refills | Status: DC

## 2023-02-22 MED ORDER — ESTRADIOL 7.5 MCG/24HR VA RING: 1.0000 | VAGINAL_RING | VAGINAL | 4 refills | Status: DC

## 2023-02-22 NOTE — Patient Instructions (Addendum)
Please use the estrogen cream nightly for the next few weeks to get your tissues in the better position. We will plan to do a pessary once your tissues have had some time to improve.   Start Gemtesa 75mg  daily for the overactive bladder spasms

## 2023-02-22 NOTE — Progress Notes (Signed)
 [facility] New Patient Evaluation and Consultation  Referring Provider: Cleotilde Ronal RAMAN, MD PCP: Theophilus Andrews, Tully GRADE, MD Date of Service: 02/22/2023  SUBJECTIVE Chief Complaint: New Patient (Initial Visit) Genie Wenke is a 76 y.o. female is here for prolapse.)  History of Present Illness: Mekaylah Klich is a 77 y.o. White or Caucasian female seen in consultation at the request of Dr. Cleotilde for evaluation of rectocele and urinary hesitancy.    Review of records significant for: Previous use of hormone replacement therapy but she is not currently on this.  20 pack year smoking hx.  Per Dr. Dianne note 11/25/22: Discuss pelvic PT vs trial of pessary. She opted for pessary placement. Pt does have more gaping appearing introitus. #2 and #3 incontinence ring with support place. #2 was too small. #3 came out with Valsalva maneuver. #4 incontinence ring with support placed and she was immediately too uncomfortable so was removed without difficulty. Some visible vaginal irritation present so discussed with pt she could possibly see some spotting or pink discharge over next few days. She did tolerate pessary fitting well but could not tolerate the #4 size   Has significant balance and coordination issues.   Urinary Symptoms: Does not leak urine.   Day time voids >10.  Nocturia: 2-4 times per night to void. Voiding dysfunction:  does not empty bladder well.  Patient does not use a catheter to empty bladder.  When urinating, patient feels a weak stream, difficulty starting urine stream, the need to urinate multiple times in a row, and to push on her belly or vagina to empty bladder Drinks: 1/2 cups coffee with half and half AM, 4-6cups Water  per day, 16 oz Caffeine Free Diet Pepsi  UTIs: 0 UTI's in the last year.   Denies history of blood in urine, kidney or bladder stones, pyelonephritis, bladder cancer, and kidney cancer   Pelvic Organ Prolapse Symptoms:                   Patient Admits to a feeling of a bulge the vaginal area. It has been present for 1 years.  Patient Admits to seeing a bulge.  This bulge is bothersome.  Bowel Symptom: Bowel movements: 1 time(s) per day Stool consistency: soft  Straining: yes.  Splinting: yes.  Incomplete evacuation: yes.  Patient Denies accidental bowel leakage / fecal incontinence Bowel regimen: miralax  and suppositories Last colonoscopy: Date 2007, Results small benign polyp   Sexual Function Sexually active: no.  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   Depression    DOE (dyspnea on exertion)    GERD    Headache    Hemorrhoids    History of basal cell carcinoma excision    left cheek   History of colon polyps    History of hyperthyroidism 2011   RAI treatement   History of panic attacks  Hyperlipidemia    Hypertension    Hypothyroidism, postradioiodine therapy    followed by pcp   IDA (iron deficiency anemia)    intermittant   Insomnia    OA (osteoarthritis)    OSA (obstructive sleep apnea)    no cpap, per pt tried unable to tolerate   Peripheral neuropathy    legs and feet from hx back surgery's   Rash    bilateral axilla area -- per pt due to some personal wipes used other the counter   RLS (restless legs syndrome)    Spondylolisthesis at L1-L2 level    Tingling of both upper extremities    left greater than right due to cervical  pinched nerve   Type 2 diabetes mellitus (HCC)    followed by pcp   Umbilical hernia    Urinary frequency    Urinary urgency      Past Surgical History:   Past Surgical History:  Procedure Laterality Date   ANTERIOR CERVICAL DECOMP/DISCECTOMY FUSION N/A 09/02/2014   Procedure: Cervical five- six  Anterior cervical decompression fusion;  Surgeon: Victory Gens, MD;  Location: [facility];  Service: Neurosurgery;  Laterality: N/A;  C5-6 Anterior cervical decompression/diskectomy/fusion   BREAST BIOPSY Right 10/2018   x 2 benign   CARPAL TUNNEL RELEASE Right 2016   CATARACT EXTRACTION W/ INTRAOCULAR LENS  IMPLANT, BILATERAL  03/2015   COLONOSCOPY  2007   ESOPHAGOGASTRODUODENOSCOPY  2007   HEMORRHOID SURGERY  03/23/2011   Procedure: HEMORRHOIDECTOMY;  Surgeon: Debby LABOR. Cornett, MD;  Location: [facility];  Service: General;  Laterality: N/A;  lateral internal sphincterotomy and hemorrhoidectomy   LUMBAR FUSION  02/2015   L1 -- 2   PARTIAL KNEE ARTHROPLASTY Left 12/27/2017   Procedure: UNICOMPARTMENTAL LEFT KNEE;  Surgeon: Josefina Chew, MD;  Location: [facility];  Service: Orthopedics;  Laterality: Left;   THORACIC DISCECTOMY N/A 12/15/2012   Procedure: Thoracic ten-eleven Thoracic laminectomy;  Surgeon: Victory Gens, MD;  Location: [facility];  Service: Neurosurgery;  Laterality: N/A;  Thoracic ten-eleven Thoracic laminectomy   TUBAL LIGATION Bilateral 1991     Past OB/GYN History: [id number] Menopausal: Yes, at age 9 Contraception: none. Last pap smear was 2014.  Any history of abnormal pap smears: no. HM PAP   This patient has no relevant Health Maintenance data.     Medications: Patient has a current medication list which includes the following prescription(s): alprazolam , atenolol , atorvastatin , calcium  citrate, hypromellose, carboxymethylcellulose sodium, estradiol , estradiol , hydrocodone -acetaminophen , hydrocortisone , onetouch ultrasoft, levothyroxine ,  lisinopril , meloxicam , metformin , bariatric rollator, multivitamin, fish oil, one touch ultra test, pantoprazole , pioglitazone , pramipexole , pregabalin , tizanidine , tobramycin , triamcinolone  cream, vibegron , vortioxetine  hbr, and ozempic  (0.25 or 0.5 mg/dose).   Allergies: Patient is allergic to duraprep [antiseptic products, misc.]; advil allergy sinus [chlorpheniramine-pse-ibuprofen]; betadine [povidone iodine]; hydromorphone  hcl; and morphine  and codeine.   Social History:  Social History   Tobacco Use   Smoking status: Former    Current packs/day: 0.00    Average packs/day: 1 pack/day for 20.0 years (20.0 ttl pk-yrs)    Types: Cigarettes    Start date: 02/23/1984    Quit date: 02/23/2004    Years since quitting: 19.0   Smokeless tobacco: Never   Tobacco comments:    quit 2007  Vaping Use   Vaping status: Never Used  Substance Use Topics   Alcohol  use: Yes    Alcohol /week: 0.0 standard drinks of alcohol     Comment: rare   Drug use: No    Relationship status: single  Patient lives with cat.   Patient is not employed. Regular exercise: No History of abuse: No  Family History:   Family History  Problem Relation Age of Onset   Heart failure Mother    Diabetes Father    COPD Father    Cancer Maternal Grandfather        stomach     Review of Systems: Review of Systems  Constitutional:  Negative for chills and fever.  Respiratory:  Positive for shortness of breath. Negative for cough.   Cardiovascular:  Positive for leg swelling. Negative for chest pain and palpitations.  Gastrointestinal:  Positive for abdominal pain. Negative for blood in stool, constipation and diarrhea.  Skin:  Negative for rash.  Neurological:  Positive for dizziness, weakness and headaches.  Endo/Heme/Allergies:  Bruises/bleeds easily.       +Hot Flashes  Psychiatric/Behavioral:  Positive for depression. Negative for suicidal ideas. The patient is nervous/anxious.      OBJECTIVE Physical  Exam: Vitals:   02/22/23 0920  BP: 133/69  Pulse: 88  Weight: 177 lb 12.8 oz (80.6 kg)  Height: 4' 11 (1.499 m)    Physical Exam Vitals reviewed. Exam conducted with a chaperone present.  Constitutional:      Appearance: Normal appearance.  Pulmonary:     Effort: Pulmonary effort is normal.  Abdominal:     Palpations: Abdomen is soft.  Skin:    General: Skin is dry.     Coloration: Skin is pale.  Neurological:     General: No focal deficit present.     Mental Status: She is alert and oriented to person, place, and time.  Psychiatric:        Mood and Affect: Mood is anxious.        Behavior: Behavior normal. Behavior is cooperative.        Thought Content: Thought content normal.      GU / Detailed Urogynecologic Evaluation:  Pelvic Exam: Normal external female genitalia; Bartholin's and Skene's glands normal in appearance; urethral meatus normal in appearance, no urethral masses or discharge.   CST: negative  Speculum exam reveals normal vaginal mucosa with atrophy. Cervix normal appearance. Uterus normal single, nontender. Adnexa normal adnexa.    With apex supported, anterior compartment defect was present  Pelvic floor strength I/V  Pelvic floor musculature: Right levator tender, Right obturator tender, Left levator non-tender, Left obturator tender  POP-Q:   POP-Q  1                                            Aa   1                                           Ba  -7.5                                              C   4  Gh  2.5                                            Pb  9                                            tvl   0                                            Ap  0                                            Bp  -7                                              D      Rectal Exam:  Normal external exam  Post-Void Residual (PVR) by Bladder Scan: In order to evaluate bladder emptying, we  discussed obtaining a postvoid residual and patient agreed to this procedure.  Procedure: The ultrasound unit was placed on the patient's abdomen in the suprapubic region after the patient had voided.      Laboratory Results: Lab Results  Component Value Date   COLORU yellow 02/22/2023   CLARITYU slightly cloudy 02/22/2023   GLUCOSEUR Negative 02/22/2023   BILIRUBINUR negative 02/22/2023   KETONESU negative 02/22/2023   SPECGRAV 1.015 02/22/2023   RBCUR negative 02/22/2023   PHUR 7.0 02/22/2023   PROTEINUR Negative 02/22/2023   UROBILINOGEN 0.2 02/22/2023   LEUKOCYTESUR Trace (A) 02/22/2023    Lab Results  Component Value Date   CREATININE 0.92 04/13/2022   CREATININE 1.15 02/05/2021   CREATININE 0.95 05/16/2020    Lab Results  Component Value Date   HGBA1C 5.5 01/10/2023    Lab Results  Component Value Date   HGB 12.9 04/13/2022     ASSESSMENT AND PLAN Ms. Gruner is a 77 y.o. with:  1. Prolapse of posterior vaginal wall   2. Prolapse of anterior vaginal wall   3. Vaginal atrophy   4. OAB (overactive bladder)   5. Urinary frequency    Patient has stage II posterior vaginal wall prolapse.  Using the POP Q tool and the IUGA book we discussed her pelvic floor dysfunction and options for treatment.  She is not the best candidate for surgery due to her comorbid conditions and reduced mobility.  If she were to want to seek surgical consult I would suggest colpocleisis. On exam her tissues were significantly atrophied but she has not been on vaginal estrogen.  She reports that she has prescriptions for vaginal estrogen at home and many unopened boxes but that she has not been using it.  I informed her that I would suggest doing a pessary that we could manage here in the office, but I would want her tissues to be more supportive before we attempt a pessary fitting.  She is already failed 1  pessary fitting with Dr. Cleotilde who referred her to the office.  Multiple different  size rings were tried and she felt that these were very uncomfortable.  As her prolapse is mostly posterior with some anterior prolapse perhaps a Gellhorn, doughnut, or cube would work better for her fitting.  Will have her use vaginal estrogen cream nightly for 2 weeks and then return for fitting. Patient has vaginal atrophy on exam. She would benefit from estrogen cream. Patient to use a blueberry sized amount into the vagina. She may use this nightly for 2 weeks and then twice weekly after. We discussed using her finger instead of using the applicator.  I also discussed with patient that after she has a pessary in place we can do a vaginal estrogen ring because she has mobility issues and may have difficulty inserting any estrogen cream around the pessary.  The ring would be left in for 3 months and then she would have it replaced with pessary check.  Patient is agreeable to this plan of care. We discussed the symptoms of overactive bladder (OAB), which include urinary urgency, urinary frequency, nocturia, with or without urge incontinence.  While we do not know the exact etiology of OAB, several treatment options exist. We discussed management including behavioral therapy (decreasing bladder irritants, urge suppression strategies, timed voids, bladder retraining), physical therapy, medication; for refractory cases posterior tibial nerve stimulation, sacral neuromodulation, and intravesical botulinum toxin injection. For anticholinergic medications, we discussed the potential side effects of anticholinergics including dry eyes, dry mouth, constipation, cognitive impairment and urinary retention. For Beta-3 agonist medication, we discussed the potential side effect of elevated blood pressure which is more likely to occur in individuals with uncontrolled hypertension. Will prescribe Gemtesa  75 mg daily for patient to use for her overactive bladder symptoms.  Will reevaluate how this has worked for her at her  pessary fitting.   Patient reminded to return in 3 to 4 weeks for a pessary fitting.   Tanieka Pownall G Chelsea Nusz, NP

## 2023-02-24 ENCOUNTER — Other Ambulatory Visit: Payer: Self-pay | Admitting: Internal Medicine

## 2023-02-24 DIAGNOSIS — F419 Anxiety disorder, unspecified: Secondary | ICD-10-CM

## 2023-02-25 ENCOUNTER — Encounter: Payer: Self-pay | Admitting: Obstetrics and Gynecology

## 2023-03-02 ENCOUNTER — Other Ambulatory Visit: Payer: Self-pay | Admitting: Internal Medicine

## 2023-03-04 ENCOUNTER — Encounter: Payer: Self-pay | Admitting: Family Medicine

## 2023-03-08 NOTE — Discharge Instructions (Signed)

## 2023-03-09 ENCOUNTER — Ambulatory Visit
Admission: RE | Admit: 2023-03-09 | Discharge: 2023-03-09 | Disposition: A | Discharge status: Home / Self Care | Payer: Medicare Other | Source: Ambulatory Visit | Attending: Family Medicine | Admitting: Family Medicine

## 2023-03-09 DIAGNOSIS — G8929 Other chronic pain: Secondary | ICD-10-CM

## 2023-03-09 DIAGNOSIS — M4807 Spinal stenosis, lumbosacral region: Secondary | ICD-10-CM | POA: Diagnosis not present

## 2023-03-09 DIAGNOSIS — M4805 Spinal stenosis, thoracolumbar region: Secondary | ICD-10-CM | POA: Diagnosis not present

## 2023-03-09 DIAGNOSIS — Z981 Arthrodesis status: Secondary | ICD-10-CM | POA: Diagnosis not present

## 2023-03-09 MED ORDER — MEPERIDINE HCL 50 MG/ML IJ SOLN
50.0000 mg | Freq: Once | INTRAMUSCULAR | Status: AC | PRN
  Administered 2023-03-09: 50 mg via INTRAMUSCULAR

## 2023-03-09 MED ORDER — DIAZEPAM 5 MG PO TABS: 5.0000 mg | ORAL_TABLET | Freq: Once | ORAL | Status: DC

## 2023-03-09 MED ORDER — IOPAMIDOL (ISOVUE-M 200) INJECTION 41%
20.0000 mL | Freq: Once | INTRAMUSCULAR | Status: AC
  Administered 2023-03-09: 20 mL via INTRATHECAL

## 2023-03-09 MED ORDER — ONDANSETRON HCL 4 MG/2ML IJ SOLN
4.0000 mg | Freq: Once | INTRAMUSCULAR | Status: AC | PRN
  Administered 2023-03-09: 4 mg via INTRAMUSCULAR

## 2023-03-10 ENCOUNTER — Other Ambulatory Visit: Payer: Self-pay | Admitting: Internal Medicine

## 2023-03-10 DIAGNOSIS — E039 Hypothyroidism, unspecified: Secondary | ICD-10-CM

## 2023-03-10 NOTE — Progress Notes (Signed)
CT myelogram of the lumbar spine shows a solid fusion from L1-L5.  However just below the level of fusion at L5-S1 you have severe spinal stenosis that I think is causing your leg pain.  Just above the fusion at T12-L1 you have the potential for a pinched nerve especially on the right side that could cause some groin or hip pain.  Recommend return to clinic to go over the results in full detail and discussed treatment plan and options including potential injections.

## 2023-03-16 ENCOUNTER — Encounter: Payer: Self-pay | Admitting: Obstetrics and Gynecology

## 2023-03-16 ENCOUNTER — Ambulatory Visit (INDEPENDENT_AMBULATORY_CARE_PROVIDER_SITE_OTHER): Payer: Medicare Other | Admitting: Obstetrics and Gynecology

## 2023-03-16 VITALS — BP 156/71 | HR 89

## 2023-03-16 DIAGNOSIS — N812 Incomplete uterovaginal prolapse: Secondary | ICD-10-CM

## 2023-03-16 DIAGNOSIS — N3281 Overactive bladder: Secondary | ICD-10-CM

## 2023-03-16 DIAGNOSIS — N8110 Cystocele, unspecified: Secondary | ICD-10-CM

## 2023-03-16 DIAGNOSIS — N952 Postmenopausal atrophic vaginitis: Secondary | ICD-10-CM

## 2023-03-16 DIAGNOSIS — N816 Rectocele: Secondary | ICD-10-CM

## 2023-03-16 NOTE — Patient Instructions (Signed)
Please use the estrogen 3 times a week (Monday, Wednesday, and Friday) and make sure you are getting it right at the opening on the bottom side.   You can continue the Gemtesa 75mg  if it is affordable.   Please call if the pessary is bothering you. If the pessary comes out this is okay. Just wash it with regular soap and water.

## 2023-03-16 NOTE — Progress Notes (Signed)
[facility]   Subjective:     Chief Complaint: Pessary fitting' Katie Booker is a 78 y.o. female is here for pessary fitting.)  History of Present Illness: Katie Booker is a 78 y.o. female with stage II pelvic organ prolapse who presents today for a pessary fitting.    Past Medical History: Patient  has a past medical history of Age-related macular degeneration, wet, right eye (HCC), Anxiety, Chronic constipation, Chronic low back pain, Chronic neck pain, Complication of anesthesia, Depression, DOE (dyspnea on exertion), GERD, Headache, Hemorrhoids, History of basal cell carcinoma excision, History of colon polyps, History of hyperthyroidism (2011), History of panic attacks, Hyperlipidemia, Hypertension, Hypothyroidism, postradioiodine therapy, IDA (iron deficiency anemia), Insomnia, OA (osteoarthritis), OSA (obstructive sleep apnea), Peripheral neuropathy, Rash, RLS (restless legs syndrome), Spondylolisthesis at L1-L2 level, Tingling of both upper extremities, Type 2 diabetes mellitus (HCC), Umbilical hernia, Urinary frequency, and Urinary urgency.   Past Surgical History: She  has a past surgical history that includes Colonoscopy (2007); Esophagogastroduodenoscopy (2007); Hemorrhoid surgery (03/23/2011); Thoracic discectomy (N/A, 12/15/2012); Anterior cervical decomp/discectomy fusion (N/A, 09/02/2014); Carpal tunnel release (Right, 2016); Tubal ligation (Bilateral, 1991); Cataract extraction w/ intraocular lens  implant, bilateral (03/2015); Lumbar fusion (02/2015); Partial knee arthroplasty (Left, 12/27/2017); and Breast biopsy (Right, 10/2018).   Medications: She has a current medication list which includes the following prescription(s): alprazolam, atenolol, atorvastatin, calcium citrate, hypromellose, carboxymethylcellulose sodium, estradiol, estradiol, hydrocodone-acetaminophen, hydrocortisone, onetouch ultrasoft, levothyroxine, lisinopril, meloxicam, metformin,  bariatric rollator, multivitamin, fish oil, one touch ultra test, pantoprazole, pioglitazone, pramipexole, pregabalin, ozempic (0.25 or 0.5 mg/dose), tizanidine, tobramycin, triamcinolone cream, vibegron, and vortioxetine hbr.   Allergies: Patient is allergic to duraprep [antiseptic products, misc.]; advil allergy sinus [chlorpheniramine-pse-ibuprofen]; betadine [povidone iodine]; hydromorphone hcl; and morphine and codeine.   Social History: Patient  reports that she quit smoking about 19 years ago. Her smoking use included cigarettes. She started smoking about 39 years ago. She has a 20 pack-year smoking history. She has never used smokeless tobacco. She reports current alcohol use. She reports that she does not use drugs.      Objective:    BP (!) 156/71   Pulse 89  Gen: No apparent distress, A&O x 3. Pelvic Exam: Normal external female genitalia; Bartholin's and Skene's glands normal in appearance; urethral meatus normal in appearance, no urethral masses or discharge.   Attempted a #2 Donut which was too girthy for patient. Attempted a #2 incontinence dish, but the knob was pressing and did not seem to support as well  A size #3 cup pessary ([id number]) was fitted. It was comfortable, stayed in place with valsalva and was an appropriate size on examination, with one finger fitting between the pessary and the vaginal walls. Patient was able to sit and attempt urination without pessary expulsion and cough without pessary expulsion.    Assessment/Plan:    Assessment: Ms. Aring is a 78 y.o. with stage II pelvic organ prolapse who presents for a pessary fitting. Plan: She was fitted with a #3 cup pessary. She will keep the pessary in place until next visit. She will use estrogen.   Follow-up in 4 weeks for a pessary check or sooner as needed.   Separate time was also taken to discuss her OAB symptoms. She reports she has felt like her frequency is less but at nighttime she is having  significant emotional disturbance when she wakes up to use the bathroom. She is unsure if this is related to the [place] 75mg  daily and reports  she will monitor for this. We discussed if she feels it is contributing to her sleep disturbance she should stop it.   All questions were answered.    Selmer Dominion, NP

## 2023-03-16 NOTE — Progress Notes (Unsigned)
Katie Payor, PhD, LAT, ATC acting as a scribe for Katie Graham, MD.  Katie Booker is a 78 y.o. female who presents to [facility] at [facility] today for f/u LBP w/ CT review. Pt was last seen by Dr. Denyse Amass on 02/21/23 and a lumbar CT myelogram was ordered.  Today, pt reports LBP is unchanged. She reports terrible pain, especially at night. Pt locates pain to below her fusion and slightly to the R w/ radiating pain bilat buttocks, R-side of her waist, and R groin.   Dx imaging: 03/09/23 L-spine CT myelogram  01/03/23 R hip & L-spine XR  Pertinent review of systems: No fevers or chills  Relevant historical information: Extensive lumbar fusion   Exam:  BP (!) 158/88   Pulse 71   Ht 4\' 11"  (1.499 m)   Wt 197 lb (89.4 kg)   SpO2 96%   BMI 39.79 kg/m  General: Well Developed, well nourished, and in no acute distress.   MSK: L-spine: Normal appearing Nontender palpation midline decreased lumbar motion.    Lab and Radiology Results  EXAM: LUMBAR MYELOGRAM   FLUOROSCOPY: Radiation Exposure Index (as provided by the fluoroscopic device): 17.50 mGy Kerma   PROCEDURE: After thorough discussion of risks and benefits of the procedure including bleeding, infection, injury to nerves, blood vessels, adjacent structures as well as headache and CSF leak, written and oral informed consent was obtained. Consent was obtained by Dr. Sebastian Ache. Time out form was completed.   Patient was positioned prone on the fluoroscopy table. Local anesthesia was provided with 1% lidocaine without epinephrine after prepped and draped in the usual sterile fashion. Puncture was performed at L4 using a 3 1/2 inch 22-gauge spinal needle via a midline approach. Using a single pass through the dura, the needle was placed within the thecal sac, with return of clear CSF. 15 mL of Isovue M-200 was injected into the thecal sac, with normal opacification of the nerve roots and  cauda equina consistent with free flow within the subarachnoid space.   I personally performed the lumbar puncture and administered the intrathecal contrast. I also personally supervised acquisition of the myelogram images.   TECHNIQUE: Contiguous axial images were obtained through the Lumbar spine after the intrathecal infusion of contrast. Coronal and sagittal reconstructions were obtained of the axial image sets.   COMPARISON:  Lumbar spine MRI 08/14/2018   FINDINGS: LUMBAR MYELOGRAM FINDINGS:   There are 5 non-rib-bearing lumbar type vertebrae. Grade 1 anterolisthesis of T9 on T10 and T10 on T11 does not significantly change with flexion or extension. Sequelae of L1-L5 fusion are identified with widely patent spinal canal from L3-L5. Poor contrast flow into the upper lumbar spine limits assessment for residual stenosis at and above L2-3 on these radiographs. There is advanced disc degeneration throughout the included lower thoracic spine and at L5-S1 with evidence of severe spinal stenosis at the latter.   CT LUMBAR MYELOGRAM FINDINGS:   No acute fracture or suspicious osseous lesion is identified. Sequelae of L1-L5 posterior and interbody fusion are again noted with solid arthrodesis at each level and no evidence of screw loosening. Advanced disc degeneration is present at T11-12, T12-L1, and L5-S1 including severe disc space narrowing, prominent vacuum disc phenomenon, and extensive degenerative endplate changes.   The conus medullaris terminates at L1-2. A small amount of subdural contrast presumably reflects a mixed injection. A small amount of contrast is also noted extending posteriorly along the needle tract and partially opacifying  a chronic postoperative fluid collection in the laminectomy bed at L4. There is abdominal aortic atherosclerosis without aneurysm.   T11-12: Disc bulging and mild facet arthrosis without significant stenosis, similar to the prior  MRI.   T12-L1: Disc bulging, endplate spurring, and mild facet and ligamentum flavum hypertrophy result in mild spinal stenosis and mild right neural foraminal stenosis, similar to the prior MRI.   L1-2 through L4-5: Prior posterior decompression and fusion. Widely patent spinal canal. No significant residual neural foraminal stenosis.   L5-S1: Disc bulging, endplate spurring, disc space height loss, ligamentum flavum hypertrophy, and severe facet arthrosis result in severe spinal stenosis and severe right greater than left neural foraminal stenosis with potential bilateral L5 and S1 nerve root impingement, stable to mildly progressed from the prior MRI.   IMPRESSION: 1. Solid L1-L5 fusion without significant residual stenosis. 2. 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 3. Unchanged mild spinal and right neural foraminal stenosis at T12-L1. 4.  Aortic Atherosclerosis (ICD10-I70.0).     Electronically Signed   By: Sebastian Ache M.D.   On: 03/09/2023 14:45 I, Katie Booker, personally (independently) visualized and performed the interpretation of the images attached in this note.      Assessment and Plan: 78 y.o. female with lumbar radiculopathy primarily involving the right L5 nerve and S1 nerve..  She does have some lumbar radiculopathy involving right L1 nerve as well. Her dominant problem is radiating pain down her right leg consistent with L5 and/or S1.  After discussion of her symptoms and review of her CT myelogram we will proceed to epidural steroid injection targeting L5 or S1 per radiology.  Consider targeting the L1 nerve in the future if needed.  She is not a good candidate for a repeat back surgery.  PDMP not reviewed this encounter. Orders Placed This Encounter  Procedures   DG INJECT DIAG/THERA/INC NEEDLE/CATH/PLC EPI/LUMB/SAC W/IMG    Standing Status:   Future    Expiration Date:   03/16/2024    Reason  for Exam (SYMPTOM  OR DIAGNOSIS REQUIRED):   ESI or NRB or both level and technique suggest target L5 or S1 first.    Preferred Imaging Location?:   [facility]    Radiology Contrast Protocol - do NOT remove file path:   [email or web address]   No orders of the defined types were placed in this encounter.    Discussed warning signs or symptoms. Please see discharge instructions. Patient expresses understanding.   The above documentation has been reviewed and is accurate and complete Katie Booker, M.D.

## 2023-03-17 ENCOUNTER — Ambulatory Visit: Payer: Medicare Other | Admitting: Family Medicine

## 2023-03-17 VITALS — BP 158/88 | HR 71 | Ht 59.0 in | Wt 197.0 lb

## 2023-03-17 DIAGNOSIS — M5441 Lumbago with sciatica, right side: Secondary | ICD-10-CM

## 2023-03-17 DIAGNOSIS — G8929 Other chronic pain: Secondary | ICD-10-CM

## 2023-03-17 NOTE — Patient Instructions (Addendum)
Thank you for coming in today.   Please call [facility] (formally [facility]) at [phone] to schedule your spine injection.    Consider water aerobics with Silver sneakers.

## 2023-03-18 NOTE — Discharge Instructions (Signed)

## 2023-03-21 ENCOUNTER — Ambulatory Visit
Admission: RE | Admit: 2023-03-21 | Discharge: 2023-03-21 | Disposition: A | Discharge status: Home / Self Care | Payer: Medicare Other | Source: Ambulatory Visit | Attending: Family Medicine | Admitting: Family Medicine

## 2023-03-21 DIAGNOSIS — G8929 Other chronic pain: Secondary | ICD-10-CM

## 2023-03-21 MED ORDER — METHYLPREDNISOLONE ACETATE 40 MG/ML INJ SUSP (RADIOLOG: 80.0000 mg | Freq: Once | INTRAMUSCULAR | Status: DC

## 2023-03-21 MED ORDER — IOPAMIDOL (ISOVUE-M 200) INJECTION 41%: 1.0000 mL | Freq: Once | INTRAMUSCULAR | Status: DC

## 2023-03-25 NOTE — Discharge Instructions (Signed)

## 2023-03-28 ENCOUNTER — Ambulatory Visit
Admission: RE | Admit: 2023-03-28 | Discharge: 2023-03-28 | Disposition: A | Discharge status: Home / Self Care | Payer: Medicare Other | Source: Ambulatory Visit | Attending: Family Medicine | Admitting: Family Medicine

## 2023-03-28 DIAGNOSIS — Z981 Arthrodesis status: Secondary | ICD-10-CM | POA: Diagnosis not present

## 2023-03-28 DIAGNOSIS — M4727 Other spondylosis with radiculopathy, lumbosacral region: Secondary | ICD-10-CM | POA: Diagnosis not present

## 2023-03-28 MED ORDER — METHYLPREDNISOLONE ACETATE 40 MG/ML INJ SUSP (RADIOLOG
80.0000 mg | Freq: Once | INTRAMUSCULAR | Status: AC
  Administered 2023-03-28: 80 mg via EPIDURAL

## 2023-03-28 MED ORDER — IOPAMIDOL (ISOVUE-M 300) INJECTION 61%
1.0000 mL | Freq: Once | INTRAMUSCULAR | Status: AC | PRN
  Administered 2023-03-28: 1 mL via EPIDURAL

## 2023-03-30 ENCOUNTER — Encounter: Payer: Self-pay | Admitting: Internal Medicine

## 2023-04-04 ENCOUNTER — Encounter: Payer: Self-pay | Admitting: Internal Medicine

## 2023-04-06 DIAGNOSIS — H353123 Nonexudative age-related macular degeneration, left eye, advanced atrophic without subfoveal involvement: Secondary | ICD-10-CM | POA: Diagnosis not present

## 2023-04-06 DIAGNOSIS — H35433 Paving stone degeneration of retina, bilateral: Secondary | ICD-10-CM | POA: Diagnosis not present

## 2023-04-06 DIAGNOSIS — H35372 Puckering of macula, left eye: Secondary | ICD-10-CM | POA: Diagnosis not present

## 2023-04-06 DIAGNOSIS — H43813 Vitreous degeneration, bilateral: Secondary | ICD-10-CM | POA: Diagnosis not present

## 2023-04-06 DIAGNOSIS — E119 Type 2 diabetes mellitus without complications: Secondary | ICD-10-CM | POA: Diagnosis not present

## 2023-04-06 DIAGNOSIS — H353211 Exudative age-related macular degeneration, right eye, with active choroidal neovascularization: Secondary | ICD-10-CM | POA: Diagnosis not present

## 2023-04-13 ENCOUNTER — Ambulatory Visit: Payer: Medicare Other | Admitting: Obstetrics and Gynecology

## 2023-04-18 ENCOUNTER — Encounter: Payer: Self-pay | Admitting: Internal Medicine

## 2023-04-18 ENCOUNTER — Ambulatory Visit (INDEPENDENT_AMBULATORY_CARE_PROVIDER_SITE_OTHER): Payer: Medicare Other | Admitting: Internal Medicine

## 2023-04-18 VITALS — BP 130/78 | HR 80 | Temp 97.7°F | Ht 60.0 in | Wt 193.3 lb

## 2023-04-18 DIAGNOSIS — I10 Essential (primary) hypertension: Secondary | ICD-10-CM

## 2023-04-18 DIAGNOSIS — E785 Hyperlipidemia, unspecified: Secondary | ICD-10-CM | POA: Diagnosis not present

## 2023-04-18 DIAGNOSIS — Z0000 Encounter for general adult medical examination without abnormal findings: Secondary | ICD-10-CM | POA: Diagnosis not present

## 2023-04-18 DIAGNOSIS — D649 Anemia, unspecified: Secondary | ICD-10-CM

## 2023-04-18 DIAGNOSIS — E039 Hypothyroidism, unspecified: Secondary | ICD-10-CM | POA: Diagnosis not present

## 2023-04-18 DIAGNOSIS — E559 Vitamin D deficiency, unspecified: Secondary | ICD-10-CM

## 2023-04-18 DIAGNOSIS — Z7985 Long-term (current) use of injectable non-insulin antidiabetic drugs: Secondary | ICD-10-CM | POA: Diagnosis not present

## 2023-04-18 DIAGNOSIS — R296 Repeated falls: Secondary | ICD-10-CM | POA: Diagnosis not present

## 2023-04-18 DIAGNOSIS — E119 Type 2 diabetes mellitus without complications: Secondary | ICD-10-CM

## 2023-04-18 LAB — LIPID PANEL
Cholesterol: 157 mg/dL (ref 0–200)
HDL: 65.3 mg/dL (ref >39.00)
LDL Cholesterol: 56 mg/dL (ref 0–99)
NonHDL: 92
Total CHOL/HDL Ratio: 2
Triglycerides: 182 mg/dL — ABNORMAL HIGH (ref 0.0–149.0)
VLDL: 36.4 mg/dL (ref 0.0–40.0)

## 2023-04-18 LAB — CBC WITH DIFFERENTIAL/PLATELET
Basophils Absolute: 0 10*3/uL (ref 0.0–0.1)
Basophils Relative: 0.7 % (ref 0.0–3.0)
Eosinophils Absolute: 0.2 10*3/uL (ref 0.0–0.7)
Eosinophils Relative: 3 % (ref 0.0–5.0)
HCT: 37.3 % (ref 36.0–46.0)
Hemoglobin: 12.3 g/dL (ref 12.0–15.0)
Lymphocytes Relative: 26.5 % (ref 12.0–46.0)
Lymphs Abs: 1.8 10*3/uL (ref 0.7–4.0)
MCHC: 32.9 g/dL (ref 30.0–36.0)
MCV: 86.4 fl (ref 78.0–100.0)
Monocytes Absolute: 0.4 10*3/uL (ref 0.1–1.0)
Monocytes Relative: 6.3 % (ref 3.0–12.0)
Neutro Abs: 4.3 10*3/uL (ref 1.4–7.7)
Neutrophils Relative %: 63.5 % (ref 43.0–77.0)
Platelets: 217 10*3/uL (ref 150.0–400.0)
RBC: 4.31 Mil/uL (ref 3.87–5.11)
RDW: 14 % (ref 11.5–15.5)
WBC: 6.7 10*3/uL (ref 4.0–10.5)

## 2023-04-18 LAB — COMPREHENSIVE METABOLIC PANEL
ALT: 23 U/L (ref 0–35)
AST: 20 U/L (ref 0–37)
Albumin: 4.4 g/dL (ref 3.5–5.2)
Alkaline Phosphatase: 74 U/L (ref 39–117)
BUN: 26 mg/dL — ABNORMAL HIGH (ref 6–23)
CO2: 27 meq/L (ref 19–32)
Calcium: 9.4 mg/dL (ref 8.4–10.5)
Chloride: 97 meq/L (ref 96–112)
Creatinine, Ser: 1.12 mg/dL (ref 0.40–1.20)
GFR: 47.56 mL/min — ABNORMAL LOW (ref >60.00)
Glucose, Bld: 83 mg/dL (ref 70–99)
Potassium: 4.9 meq/L (ref 3.5–5.1)
Sodium: 134 meq/L — ABNORMAL LOW (ref 135–145)
Total Bilirubin: 0.4 mg/dL (ref 0.2–1.2)
Total Protein: 7 g/dL (ref 6.0–8.3)

## 2023-04-18 LAB — VITAMIN D 25 HYDROXY (VIT D DEFICIENCY, FRACTURES): VITD: 36.22 ng/mL (ref 30.00–100.00)

## 2023-04-18 LAB — MICROALBUMIN / CREATININE URINE RATIO
Creatinine,U: 27.7 mg/dL
Microalb Creat Ratio: 46.5 mg/g — ABNORMAL HIGH (ref 0.0–30.0)
Microalb, Ur: 1.3 mg/dL (ref 0.0–1.9)

## 2023-04-18 LAB — TSH: TSH: 5.15 u[IU]/mL (ref 0.35–5.50)

## 2023-04-18 NOTE — Progress Notes (Signed)
 Established Patient Office Visit     CC/Reason for Visit: Annual preventive exam and subsequent Medicare wellness visit  HPI: Katie Booker is a 78 y.o. female who is coming in today for the above mentioned reasons. Past Medical History is significant for: Spinal stenosis, anxiety, insomnia, hypertension, hyperlipidemia, type 2 diabetes, hypothyroidism, restless leg syndrome, uterine prolapse.  She has been feeling well.  Has had increasing falls over the past year.  Cannot afford physical therapy.  She is overdue for mammogram, overdue for Tdap and COVID vaccinations.   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   Depression    DOE (dyspnea on exertion)    GERD    Headache    Hemorrhoids    History of basal cell carcinoma excision    left cheek   History of colon polyps    History of hyperthyroidism 2011   RAI treatement   History of panic attacks    Hyperlipidemia    Hypertension    Hypothyroidism, postradioiodine therapy    followed by pcp   IDA (iron deficiency anemia)    intermittant   Insomnia    OA (osteoarthritis)    OSA (obstructive sleep apnea)    no cpap, per pt tried unable to tolerate   Peripheral neuropathy    legs and feet from hx back surgery's   Rash    bilateral axilla area -- per pt due to some personal wipes used other the counter   RLS (restless legs syndrome)    Spondylolisthesis at L1-L2 level    Tingling of both upper extremities    left greater than right due to cervical pinched nerve   Type 2 diabetes mellitus (HCC)    followed by pcp   Umbilical hernia    Urinary frequency    Urinary urgency     Past Surgical History:  Procedure  Laterality Date   ANTERIOR CERVICAL DECOMP/DISCECTOMY FUSION N/A 09/02/2014   Procedure: Cervical five- six  Anterior cervical decompression fusion;  Surgeon: Barnett Abu, MD;  Location: [facility];  Service: Neurosurgery;  Laterality: N/A;  C5-6 Anterior cervical decompression/diskectomy/fusion   BREAST BIOPSY Right 10/2018   x 2 benign   CARPAL TUNNEL RELEASE Right 2016   CATARACT EXTRACTION W/ INTRAOCULAR LENS  IMPLANT, BILATERAL  03/2015   COLONOSCOPY  2007   ESOPHAGOGASTRODUODENOSCOPY  2007   HEMORRHOID SURGERY  03/23/2011   Procedure: HEMORRHOIDECTOMY;  Surgeon: Clovis Pu. Cornett, MD;  Location: [facility];  Service: General;  Laterality: N/A;  lateral internal sphincterotomy and hemorrhoidectomy   LUMBAR FUSION  02/2015   L1 -- 2   PARTIAL KNEE ARTHROPLASTY Left 12/27/2017   Procedure: UNICOMPARTMENTAL LEFT KNEE;  Surgeon: Teryl Lucy, MD;  Location: [facility];  Service: Orthopedics;  Laterality: Left;   THORACIC DISCECTOMY N/A 12/15/2012   Procedure: Thoracic ten-eleven Thoracic laminectomy;  Surgeon: Barnett Abu, MD;  Location: [facility];  Service: Neurosurgery;  Laterality: N/A;  Thoracic ten-eleven Thoracic laminectomy   TUBAL LIGATION Bilateral 1991    Social History:  reports that she quit smoking about 19 years ago. Her smoking use included cigarettes.  She started smoking about 39 years ago. She has a 20 pack-year smoking history. She has never used smokeless tobacco. She reports current alcohol use. She reports that she does not use drugs.  Allergies: Allergies  Allergen Reactions   Duraprep [Antiseptic Products, Misc.] Itching and Rash    Irritation everywhere prep was used   Advil Allergy Sinus [Chlorpheniramine-Pse-Ibuprofen]     Nervousness    Betadine [Povidone Iodine] Other (See Comments)    Burning sensation.    Hydromorphone Hcl Other (See Comments)    Makes crazy   Morphine And Codeine Other (See Comments)    hallucinations    Family  History:  Family History  Problem Relation Age of Onset   Heart failure Mother    Diabetes Father    COPD Father    Cancer Maternal Grandfather        stomach     Current Outpatient Medications:    ALPRAZolam (XANAX) 0.5 MG tablet, TAKE 1 TABLET(0.5 MG) BY MOUTH TWICE DAILY AS NEEDED FOR ANXIETY, Disp: 90 tablet, Rfl: 0   atenolol (TENORMIN) 25 MG tablet, TAKE 1 TABLET BY MOUTH DAILY, Disp: 100 tablet, Rfl: 2   atorvastatin (LIPITOR) 40 MG tablet, TAKE 1 TABLET BY MOUTH DAILY, Disp: 100 tablet, Rfl: 2   calcium citrate (CALCITRATE - DOSED IN MG ELEMENTAL CALCIUM) 950 (200 Ca) MG tablet, Take 200 mg of elemental calcium by mouth daily., Disp: , Rfl:    Carboxymethylcell-Hypromellose (GENTEAL OP), Apply to eye., Disp: , Rfl:    Carboxymethylcellulose Sodium (LUBRICANT EYE DROPS OP), Apply 2 drops to eye 4 (four) times daily as needed (dry eyes). , Disp: , Rfl:    estradiol (ESTRACE) 0.1 MG/GM vaginal cream, Place 0.5 g vaginally 2 (two) times a week. Place 0.5g nightly for two weeks then twice a week after, Disp: 42.5 g, Rfl: 11   estradiol (ESTRING) 7.5 MCG/24HR vaginal ring, Place 1 each vaginally every 3 (three) months., Disp: 1 each, Rfl: 4   HYDROcodone-acetaminophen (NORCO) 7.5-325 MG tablet, Take 1 tablet by mouth every 6 (six) hours as needed for moderate pain (pain score 4-6)., Disp: 120 tablet, Rfl: 0   hydrocortisone 2.5 % cream, APPLY TO AFFECTED AREA(S)  TOPICALLY TWICE DAILY, Disp: 113.4 g, Rfl: 0   Lancets (ONETOUCH ULTRASOFT) lancets, Use daily, Disp: 100 each, Rfl: 6   levothyroxine (SYNTHROID) 125 MCG tablet, TAKE 1 TABLET BY MOUTH DAILY, Disp: 100 tablet, Rfl: 1   lisinopril (ZESTRIL) 10 MG tablet, TAKE 1 TABLET BY MOUTH DAILY, Disp: 100 tablet, Rfl: 0   meloxicam (MOBIC) 15 MG tablet, TAKE 1 TABLET BY MOUTH DAILY, Disp: 100 tablet, Rfl: 2   metFORMIN (GLUCOPHAGE) 1000 MG tablet, TAKE 1 TABLET BY MOUTH TWICE  DAILY WITH MEALS, Disp: 200 tablet, Rfl: 1   Misc. Devices  (BARIATRIC ROLLATOR) MISC, 1 Rollator with basket., Disp: 1 each, Rfl: 0   Multiple Vitamin (MULTIVITAMIN) tablet, Take 1 tablet by mouth daily., Disp: , Rfl:    Omega-3 Fatty Acids (FISH OIL) 1200 MG CAPS, Take 1 capsule by mouth daily. , Disp: , Rfl:    ONE TOUCH ULTRA TEST test strip, USE 1 DAILY AS DIRECTED, Disp: 100 each, Rfl: 3   pantoprazole (PROTONIX) 40 MG tablet, Take 1 tablet (40 mg total) by mouth 2 (two) times daily before a meal., Disp: 200 tablet, Rfl: 1   pioglitazone (ACTOS) 15 MG tablet, TAKE 1 TABLET BY MOUTH DAILY, Disp: 100 tablet, Rfl: 2   pramipexole (MIRAPEX) 0.125 MG tablet,  Take 1 tablet (0.125 mg total) by mouth at bedtime., Disp: 30 tablet, Rfl: 2   pregabalin (LYRICA) 75 MG capsule, TAKE 1 CAPSULE BY MOUTH TWICE  DAILY, Disp: 180 capsule, Rfl: 0   Semaglutide,0.25 or 0.5MG /DOS, (OZEMPIC, 0.25 OR 0.5 MG/DOSE,) 2 MG/3ML SOPN, Inject 0.5 mg into the skin once a week., Disp: 3 mL, Rfl: 0   tiZANidine (ZANAFLEX) 4 MG tablet, TAKE 1 TABLET BY MOUTH 3 TIMES  DAILY AS NEEDED, Disp: 180 tablet, Rfl: 0   tobramycin (TOBREX) 0.3 % ophthalmic solution, Place 1 drop into both eyes every 6 (six) hours. Day before injection, day of injection and day after injection, Disp: , Rfl:    triamcinolone cream (KENALOG) 0.1 %, Apply 1 Application topically 2 (two) times daily., Disp: 80 g, Rfl: 0   Vibegron 75 MG TABS, Take 1 tablet (75 mg total) by mouth daily., Disp: 30 tablet, Rfl: 5   vortioxetine HBr (TRINTELLIX) 5 MG TABS tablet, Take 10 mg by mouth daily., Disp: , Rfl:   Review of Systems:  Negative unless indicated in HPI.   Physical Exam: Vitals:   04/18/23 0942  BP: 130/78  Pulse: 80  Temp: 97.7 F (36.5 C)  TempSrc: Oral  SpO2: 98%  Weight: 193 lb 4.8 oz (87.7 kg)  Height: 5' (1.524 m)    Body mass index is 37.75 kg/m.   Physical Exam Vitals reviewed.  Constitutional:      General: She is not in acute distress.    Appearance: Normal appearance. She is obese.  She is not ill-appearing, toxic-appearing or diaphoretic.  HENT:     Head: Normocephalic.     Right Ear: Tympanic membrane, ear canal and external ear normal. There is no impacted cerumen.     Left Ear: Tympanic membrane, ear canal and external ear normal. There is no impacted cerumen.     Nose: Nose normal.     Mouth/Throat:     Mouth: Mucous membranes are moist.     Pharynx: Oropharynx is clear. No oropharyngeal exudate or posterior oropharyngeal erythema.  Eyes:     General: No scleral icterus.       Right eye: No discharge.        Left eye: No discharge.     Conjunctiva/sclera: Conjunctivae normal.     Pupils: Pupils are equal, round, and reactive to light.  Neck:     Vascular: No carotid bruit.  Cardiovascular:     Rate and Rhythm: Normal rate and regular rhythm.     Pulses: Normal pulses.     Heart sounds: Normal heart sounds.  Pulmonary:     Effort: Pulmonary effort is normal. No respiratory distress.     Breath sounds: Normal breath sounds.  Abdominal:     General: Abdomen is flat. Bowel sounds are normal.     Palpations: Abdomen is soft.  Musculoskeletal:        General: Normal range of motion.     Cervical back: Normal range of motion.  Skin:    General: Skin is warm and dry.  Neurological:     General: No focal deficit present.     Mental Status: She is alert and oriented to person, place, and time. Mental status is at baseline.  Psychiatric:        Mood and Affect: Mood normal.        Behavior: Behavior normal.        Thought Content: Thought content normal.  Judgment: Judgment normal.    Subsequent Medicare wellness visit   1. Risk factors, based on past  M,S,F - Cardiac Risk Factors include: advanced age (>71men, >53 women);diabetes mellitus;hypertension   2.  Physical activities: Dietary issues and exercise activities discussed:      3.  Depression/mood:  Flowsheet Row Office Visit from 04/18/2023 in [facility] Total Score 7        4.  ADL's:    04/18/2023    9:30 AM  In your present state of health, do you have any difficulty performing the following activities:  Hearing? 0  Vision? 0  Difficulty concentrating or making decisions? 1  Walking or climbing stairs? 1  Dressing or bathing? 1  Doing errands, shopping? 0  Preparing Food and eating ? N  Using the Toilet? N  In the past six months, have you accidently leaked urine? Y  Do you have problems with loss of bowel control? Y  Managing your Medications? N  Managing your Finances? N  Housekeeping or managing your Housekeeping? N     5.  Fall risk:     07/29/2022    1:04 PM 11/25/2022   10:35 AM 12/31/2022    1:48 PM 01/09/2023    1:44 PM 04/18/2023    9:34 AM  Fall Risk  Falls in the past year? 1 1 1 1 1   Was there an injury with Fall? 0 1 1 0 0  Fall Risk Category Calculator 2 3 3 2  2   Patient at Risk for Falls Due to  History of fall(s) History of fall(s);Impaired mobility;Medication side effect;Impaired balance/gait    Fall risk Follow up Falls evaluation completed Falls evaluation completed Falls prevention discussed  Falls evaluation completed     Patient-reported     6.  Home safety: No problems identified   7.  Height weight, and visual acuity: height and weight as above, vision/hearing: Vision Screening   Right eye Left eye Both eyes  Without correction 20/70 20/70 20/70   With correction        8.  Counseling: Counseling given: Not Answered Tobacco comments: quit 2007    9. Lab orders based on risk factors: Laboratory update will be reviewed   10. Cognitive assessment:        04/18/2023    9:34 AM 04/13/2022   10:00 AM  6CIT Screen  What Year? 0 points 0 points  What month? 0 points 0 points  What time? 0 points 0 points  Count back from 20 0 points 0 points  Months in reverse 0 points 0 points  Repeat phrase 0 points 0 points  Total Score 0 points 0 points     11. Screening: Patient  provided with a written and personalized 5-10 year screening schedule in the AVS. Health Maintenance  Topic Date Due   COVID-19 Vaccine (5 - 2024-25 season) 10/24/2022   Yearly kidney function blood test for diabetes  04/14/2023   Yearly kidney health urinalysis for diabetes  04/14/2023   Complete foot exam   04/14/2023   Hemoglobin A1C  07/10/2023   DTaP/Tdap/Td vaccine (2 - Td or Tdap) 07/18/2023   Eye exam for diabetics  11/16/2023   Medicare Annual Wellness Visit  04/17/2024   Pneumonia Vaccine  Completed   Flu Shot  Completed   DEXA scan (bone density measurement)  Completed   Hepatitis C Screening  Completed   Zoster (Shingles) Vaccine  Completed   HPV  Vaccine  Aged Out    12. Provider List Update: Patient Care Team    Relationship Specialty Notifications Start End  Philip Aspen, Limmie Patricia, MD PCP - General Internal Medicine  01/13/18   Sherrill Raring, [facility]  Pharmacist  07/14/22   Rodolph Bong, MD Consulting Physician Family Medicine  12/31/22   Stephannie Li, MD Consulting Physician Ophthalmology  01/17/23   Stephannie Li, MD Consulting Physician Ophthalmology  04/18/23      13. Advance Directives: Does Patient Have a Medical Advance Directive?: No Would patient like information on creating a medical advance directive?: No - Patient declined  14. Opioids: Patient is not on any opioid prescriptions and has no risk factors for a substance use disorder.   15.   Goals      Care Management     Current Barriers:  Chronic Disease Management support and education needs related to Hypertension  Planned Interventions: Reviewed current treatment plan related to Hypertension, self-management, and adherence to plan as established by provider.  Reviewed medications and indications for use. Reports taking medications as prescribed. Reports tolerating regimen. Reports some episodes of short-term memory loss but reports managing medications well. Agreed to keep team update of  concerns r/t medication management or prescription cost. Provided information regarding established blood pressure parameters along with indications for notifying a provider. Reports not monitoring consistently. Advised to try and monitor daily and record readings to identify trends. Reviewed symptoms. Denies chest pain or palpitations. Denies headaches, dizziness, or visual changes.  Discussed compliance with recommended cardiac prudent diet. Encouraged to read nutrition labels, continue monitoring sodium intake, and avoid highly processed foods when possible. Reports doing "ok" with meals. Primary concern is meal choices. Discussed possible benefits of grocery delivery and options for meal delivery. Agreed to update with decision. Will submit additional referrals if needed. Reviewed s/sx of heart attack, stroke and worsening symptoms that require immediate medical attention. Update 04/07/22: Patient requested assistance with obtaining a Medical Alert device. Currently insured with [employer]. Per [facility] representative certain devices are available as an insurance benefit. Patient agreed to follow up to determine if she qualifies with her current insurance plan.  Will follow up next week and assist with conference call to her insurance provider if needed.  Update 04/14/22: Follow up regarding Medical Alert device. Patient reports that she will wait to obtain the device. Discussed options for using a phone with notification alerts versus the Medical Alert device available via her insurance plan. Agreed to contact the team if additional assistance is needed.   BP Readings from Last 3 Encounters:  03/26/22 (!) 145/69  10/19/21 138/78  09/14/21 (!) 150/86    Symptom Management: Take all medications as prescribed Attend all scheduled provider appointments Call pharmacy for medication refills 3-7 days in advance of running out of medications Check blood pressure  Keep a blood pressure log Call doctor  for signs and symptoms of high blood pressure Read nutrition labels. Eat whole grains, fruits and vegetables, lean meats and healthy fats Limit salt intake  Call provider office for new concerns or questions    Follow Up Plan:  Will follow up next week       Current Barriers:  Care Management support and education needs related to   Planned Interventions:    Remains a high fall risk, reports most of them occurred after her achilles tendon injury. Her last fall occurred in the shower.  Bathing is hard now.  Can't crawl Reports very careful  movements  Walking straight and standing straight seems to work for pain.  Limited balance   Discussed options for assistance in the home. She has discussed with friends.  Prefers not to have anyone in the bathroom helping her. She has trouble going int he restroom d/t severe pain.   Reports it's very hard running the vacuum on Monday and noticed injury on Tuesday.   Experiencing Sciatica/reports this has been ongoing She saw an ortho provider for this in the past Prefers not to be referred to a new specialist since her sciatica is not new Saw Dr. Brett Albino   Needs to reapply for food stamps and medicaid Currenlty using her tablet Send via email...   [email or web address] Also send groups of     Reports condo fee is $250 month/needs copies of that to send for food stamps and application   Son spoke about paying to have someone clean the home Will call son and remind him.  Has medicaid but it only pays for [employer]. Needs to reapply/used to have food stamps and now paying 174 month  Needs to reapply  No longer drives. She has not renewed her insurance/inspection or personal property tax. Reports she doesn't have the money to go through that.   Follow up regarding utilities and assistance  She has contacted [facility]. She is currently engaged with this team/Kelly for therapy. Plan is to slowly wean from some of  her medications. Reports Tresa Endo is concerned about the number of pain medications and sedatives. Continues on plan to slowly wean off of Xanax.  She has started Ozempic. She was waiting to receive it last month. Started Trentelex last week  Contacted [facility] Monday at 1415  BP Readings from Last 3 Encounters:  11/25/22 (!) 150/78  04/13/22 134/77  03/26/22 (!) 145/69      Current Barriers:  Chronic Disease Management support and education needs related to Diabetes  Planned Interventions: Reviewed provider's plan for diabetes management.  Reviewed medication. Reports taking medications as prescribed. Reports episodes of short-term memory loss but reports doing well with medication management. Discussed importance of blood glucose readings. Reports currently not monitoring. Advised to monitor fasting readings and maintain a log. Provided information regarding hypoglycemia and hyperglycemia along with appropriate interventions. Denies recent hypoglycemic or hyperglycemic episodes. Discussed nutritional intake and importance of complying with a diabetic diet. Reports attempting to adhere to recommended diet, but notes meal choices aren't always optimal. Discussed benefits of grocery delivery and options for meal delivery. Advised to increase consumption of fruits, vegetables, and lean proteins. Advised to monitor intake of carbohydrates and avoid foods and beverages with added sugar when possible.  Discussed importance of completing recommended DM preventive care. Reports completing daily foot care as advised. Reports eye is exam is up to date. Currently being treated for macular degeneration. Discussed importance of completing ordered labs as prescribed.  Assessed social determinant of health barriers.   Lab Results  Component Value Date   HGBA1C 5.9 (A) 09/10/2021    Symptom Management: Attend all scheduled provider appointments Take medications as  prescribed Monitor blood glucose and maintain a log Check feet daily for cuts, sores or redness Wash and dry feet carefully every day Wear comfortable, cotton socks Wear comfortable, well-fitting shoes Read food labels for fat, fiber, carbohydrates and portion size Limit intake of foods with added sugars Call provider office for new concerns or questions   Follow Up Plan:  Will follow up within the  next month       Goal: CCM (Diabetes) Expected Outcome:  Monitor, Self-Manage And Reduce Symptoms of Diabetes     Goal: CCM (Hypertension) Expected Outcome:  Monitor, Self-Manage And Reduce Symptoms of Hypertension     Patient Stated     weight loss     Evidence-based guidance:   Assess readiness for transition by reviewing adolescent and caregiver goals for transition and adolescent's self-management skills.  Consider available support, presence of risk-taking behaviors, social stressors and medical and/or psychologic comorbidity.  Acknowledge and normalize fear of change, loss of relationship with pediatric provider, change in parental role in health care.   Assist patient to accept responsibility for transition by encouraging effective communication skills, decision-making, self-advocacy and self-care.  Establish a structured approach to transition by beginning in early adolescence or at least 1 year prior to planned transition during late adolescence or early adulthood; consider developmental readiness.  Discuss readiness to transition annually with providers, patient, parent or caregiver; consider development of an individualized written transition plan.  Provide anticipatory guidance regarding differences and similarities between pediatric and adult care.  Present transition as a normal, positive event and an example of letting go of childhood.  Provide emotional support to both child and parent or caregiver to cope with life changes, stressors, disengagement of parent or caregiver from  care decisions.  Transfer responsibilities for care to adolescent gradually.  Arrange a joint visit with pediatric and adult provider when able.  Provide monthly telephone follow-up after transition.  Develop a reproductive health plan; address contraception and refer for preconception counseling.  Assist patient to accept responsibility for transition.    Notes:          I have personally reviewed and noted the following in the patient's chart:   Medical and social history Use of alcohol, tobacco or illicit drugs  Current medications and supplements Functional ability and status Nutritional status Physical activity Advanced directives List of other physicians Hospitalizations, surgeries, and ER visits in previous 12 months Vitals Screenings to include cognitive, depression, and falls Referrals and appointments  In addition, I have reviewed and discussed with patient certain preventive protocols, quality metrics, and best practice recommendations. A written personalized care plan for preventive services as well as general preventive health recommendations were provided to patient.   Impression and Plan:  Medicare annual wellness visit, subsequent  Diabetes mellitus without complication (HCC) -     CBC with Differential/Platelet; Future -     Comprehensive metabolic panel; Future -     Microalbumin / creatinine urine ratio; Future -     AMB Referral [facility]BCI Care Management  Vitamin D deficiency -     VITAMIN D 25 Hydroxy (Vit-D Deficiency, Fractures); Future  Acquired hypothyroidism -     TSH; Future  Dyslipidemia -     Lipid panel; Future -     AMB Referral [facility]BCI Care Management  Essential hypertension -     AMB Referral [facility]BCI Care Management  Anemia, unspecified type -     AMB Referral [facility]BCI Care Management  Frequent falls -     Vitamin B12; Future    -Recommend routine eye and dental care. -Healthy lifestyle discussed in detail. -Labs to be updated  today. -Prostate cancer screening: N/A Health Maintenance  Topic Date Due   COVID-19 Vaccine (5 - 2024-25 season) 10/24/2022   Yearly kidney function blood test for diabetes  04/14/2023   Yearly kidney health urinalysis for diabetes  04/14/2023   Complete foot  exam   04/14/2023   Hemoglobin A1C  07/10/2023   DTaP/Tdap/Td vaccine (2 - Td or Tdap) 07/18/2023   Eye exam for diabetics  11/16/2023   Medicare Annual Wellness Visit  04/17/2024   Pneumonia Vaccine  Completed   Flu Shot  Completed   DEXA scan (bone density measurement)  Completed   Hepatitis C Screening  Completed   Zoster (Shingles) Vaccine  Completed   HPV Vaccine  Aged Out    -Advised to update Tdap and COVID vaccines at pharmacy. -She will get her mammogram at the breast center.    Chaya Jan, MD [facility]

## 2023-04-19 ENCOUNTER — Telehealth: Payer: Self-pay

## 2023-04-19 NOTE — Progress Notes (Signed)
 Care Guide Pharmacy Note  04/19/2023 Name: Katie Booker MRN: [id number] DOB: 09-10-1945  Referred By: Philip Aspen, Limmie Patricia, MD Reason for referral: Care Coordination (Outreach to schedule with pharm d )   Katie Booker is a 78 y.o. year old female who is a primary care patient of Henderson Cloud, MD.  Katie Booker was referred to the pharmacist for assistance related to: HTN and DMII  Successful contact was made with the patient to discuss pharmacy services including being ready for the pharmacist to call at least 5 minutes before the scheduled appointment time and to have medication bottles and any blood pressure readings ready for review. The patient agreed to meet with the pharmacist via telephone visit on (date/time).05/04/2023  Penne Lash , RMA     [place]  [facility], [facility] Guide  Direct Dial: [phone]  Website: [email or web address]

## 2023-04-25 ENCOUNTER — Encounter: Payer: Self-pay | Admitting: Internal Medicine

## 2023-04-25 DIAGNOSIS — M48062 Spinal stenosis, lumbar region with neurogenic claudication: Secondary | ICD-10-CM

## 2023-04-25 DIAGNOSIS — F419 Anxiety disorder, unspecified: Secondary | ICD-10-CM

## 2023-04-26 ENCOUNTER — Encounter: Payer: Self-pay | Admitting: Internal Medicine

## 2023-04-26 MED ORDER — ALPRAZOLAM 0.5 MG PO TABS: 0.5000 mg | ORAL_TABLET | Freq: Two times a day (BID) | ORAL | 2 refills | Status: DC

## 2023-04-26 MED ORDER — HYDROCODONE-ACETAMINOPHEN 7.5-325 MG PO TABS: 1.0000 | ORAL_TABLET | Freq: Four times a day (QID) | ORAL | 0 refills | Status: DC | PRN

## 2023-05-02 ENCOUNTER — Encounter: Payer: Self-pay | Admitting: Obstetrics and Gynecology

## 2023-05-02 ENCOUNTER — Ambulatory Visit (INDEPENDENT_AMBULATORY_CARE_PROVIDER_SITE_OTHER): Payer: Medicare Other | Admitting: Obstetrics and Gynecology

## 2023-05-02 VITALS — BP 148/74 | HR 80

## 2023-05-02 DIAGNOSIS — N3281 Overactive bladder: Secondary | ICD-10-CM

## 2023-05-02 DIAGNOSIS — N812 Incomplete uterovaginal prolapse: Secondary | ICD-10-CM | POA: Diagnosis not present

## 2023-05-02 DIAGNOSIS — N952 Postmenopausal atrophic vaginitis: Secondary | ICD-10-CM

## 2023-05-02 DIAGNOSIS — N8110 Cystocele, unspecified: Secondary | ICD-10-CM

## 2023-05-02 DIAGNOSIS — N816 Rectocele: Secondary | ICD-10-CM

## 2023-05-02 NOTE — Patient Instructions (Addendum)
 You can do pumpkin seed extract up to 5gm daily to assist in bladder control. Most the ones on amazon start at 3gm daily which is fine to start with.

## 2023-05-02 NOTE — Progress Notes (Signed)
 [facility]   Subjective:     Chief Complaint:  Chief Complaint  Patient presents with   Pessary Check    Timothy Townsel is a 78 y.o. female is here for pessary check.   History of Present Illness: Loreli Debruler is a 78 y.o. female with stage II pelvic organ prolapse who presents for a pessary check. She is using a size #3 cup pessary. The pessary has been working well and she has no complaints. She is using vaginal estrogen. She denies vaginal bleeding.  Past Medical History: Patient  has a past medical history of Age-related macular degeneration, wet, right eye (HCC), Anxiety, Chronic constipation, Chronic low back pain, Chronic neck pain, Complication of anesthesia, Depression, DOE (dyspnea on exertion), GERD, Headache, Hemorrhoids, History of basal cell carcinoma excision, History of colon polyps, History of hyperthyroidism (2011), History of panic attacks, Hyperlipidemia, Hypertension, Hypothyroidism, postradioiodine therapy, IDA (iron deficiency anemia), Insomnia, OA (osteoarthritis), OSA (obstructive sleep apnea), Peripheral neuropathy, Rash, RLS (restless legs syndrome), Spondylolisthesis at L1-L2 level, Tingling of both upper extremities, Type 2 diabetes mellitus (HCC), Umbilical hernia, Urinary frequency, and Urinary urgency.   Past Surgical History: She  has a past surgical history that includes Colonoscopy (2007); Esophagogastroduodenoscopy (2007); Hemorrhoid surgery (03/23/2011); Thoracic discectomy (N/A, 12/15/2012); Anterior cervical decomp/discectomy fusion (N/A, 09/02/2014); Carpal tunnel release (Right, 2016); Tubal ligation (Bilateral, 1991); Cataract extraction w/ intraocular lens  implant, bilateral (03/2015); Lumbar fusion (02/2015); Partial knee arthroplasty (Left, 12/27/2017); and Breast biopsy (Right, 10/2018).   Medications: She has a current medication list which includes the following prescription(s): alprazolam, atenolol, atorvastatin, calcium  citrate, hypromellose, carboxymethylcellulose sodium, estradiol, estradiol, hydrocodone-acetaminophen, hydrocodone-acetaminophen, hydrocodone-acetaminophen, hydrocortisone, onetouch ultrasoft, levothyroxine, lisinopril, meloxicam, metformin, bariatric rollator, multivitamin, fish oil, one touch ultra test, pantoprazole, pioglitazone, pramipexole, pregabalin, ozempic (0.25 or 0.5 mg/dose), tizanidine, tobramycin, triamcinolone cream, vibegron, and vortioxetine hbr.   Allergies: Patient is allergic to duraprep [antiseptic products, misc.]; advil allergy sinus [chlorpheniramine-pse-ibuprofen]; betadine [povidone iodine]; hydromorphone hcl; and morphine and codeine.   Social History: Patient  reports that she quit smoking about 19 years ago. Her smoking use included cigarettes. She started smoking about 39 years ago. She has a 20 pack-year smoking history. She has never used smokeless tobacco. She reports current alcohol use. She reports that she does not use drugs.      Objective:    Physical Exam: BP (!) 148/74   Pulse 80  Gen: No apparent distress, A&O x 3. Detailed Urogynecologic Evaluation:  Pelvic Exam: Normal external female genitalia; Bartholin's and Skene's glands normal in appearance; urethral meatus normal in appearance, no urethral masses or discharge. The pessary was noted to be in place. It was removed and cleaned. Speculum exam revealed no lesions in the vagina. The pessary was replaced. It was comfortable to the patient and fit well.    Assessment/Plan:    Assessment: Ms. Spillman is a 78 y.o. with stage II pelvic organ prolapse here for a pessary check. She is doing well.  Plan: She will keep the pessary in place until next visit. She will continue to use estrogen. She will follow-up in 3 months for a pessary check or sooner as needed.   Patient is not taking the Vibegron 75mg  due to cost. We discussed she can add in pumpkin seed extract up to 5gm daily. Overall she feels the  pessary and deep breathing has been helping her with urination and will continue with both.   All questions were answered.

## 2023-05-03 ENCOUNTER — Other Ambulatory Visit: Payer: Self-pay | Admitting: Internal Medicine

## 2023-05-03 DIAGNOSIS — M48062 Spinal stenosis, lumbar region with neurogenic claudication: Secondary | ICD-10-CM

## 2023-05-04 ENCOUNTER — Other Ambulatory Visit (INDEPENDENT_AMBULATORY_CARE_PROVIDER_SITE_OTHER): Payer: Medicare Other

## 2023-05-04 DIAGNOSIS — E119 Type 2 diabetes mellitus without complications: Secondary | ICD-10-CM

## 2023-05-04 DIAGNOSIS — I10 Essential (primary) hypertension: Secondary | ICD-10-CM

## 2023-05-04 NOTE — Progress Notes (Signed)
 05/04/2023 Name: Katie Booker MRN: [id number] DOB: 1945-10-21  Chief Complaint  Patient presents with   Diabetes   Hypertension   Medication Management    Katie Booker is a 78 y.o. year old female who presented for a telephone visit.   They were referred to the pharmacist by their PCP for assistance in managing diabetes and hypertension.    Subjective:  Care Team: Primary Care Provider: Philip Aspen, Limmie Patricia, MD   Medication Access/Adherence  Current Pharmacy:  [facility] [id number] [place], [place] - [address] AT [facility] OF [facility] [address] [place] [address] Phone: [phone] Fax: [phone]  [facility] - [place], [place] - [address] [place] [address] Phone: [phone] Fax: [phone]   Patient reports affordability concerns with their medications: Yes  - Gemtasa, Trintellix Patient reports access/transportation concerns to their pharmacy: No  Patient reports adherence concerns with their medications:  No     Diabetes:  Current medications: Actos, Metformin Medications tried in the past: Ozempic (nausea)  Patient expresses interest in trying another GLP-1 such as mounjaro to see if she tolerates better  Hypertension:  Current medications: Lisinopril 10mg  Medications previously tried: Atenolol, Chlorthalidone   Depression/Anxiety   Current medications: Trintellix, Xanax  Behavioral Health support: Charlcie Cradle  Reports that Behavioral Health has strongly encouraged trying to come off the xanax    Objective:  Lab Results  Component Value Date   HGBA1C 5.5 01/10/2023    Lab Results  Component Value Date   CREATININE 1.12 04/18/2023   BUN 26 (H) 04/18/2023   NA 134 (L) 04/18/2023   K 4.9 04/18/2023   CL 97 04/18/2023   CO2 27 04/18/2023    Lab Results  Component Value Date   CHOL 157 04/18/2023   HDL 65.30 04/18/2023    LDLCALC 56 04/18/2023   LDLDIRECT 73.0 04/13/2022   TRIG 182.0 (H) 04/18/2023   CHOLHDL 2 04/18/2023    Medications Reviewed Today     Reviewed by Sherrill Raring, RPH (Pharmacist) on 05/04/23 at 1350  Med List Status: <None>   Medication Order Taking? Sig Documenting Provider Last Dose Status Informant  ALPRAZolam (XANAX) 0.5 MG tablet [id number] Yes Take 1 tablet (0.5 mg total) by mouth 2 (two) times daily. Philip Aspen, Limmie Patricia, MD Taking Active   atenolol (TENORMIN) 25 MG tablet [id number] Yes TAKE 1 TABLET BY MOUTH DAILY Philip Aspen, Limmie Patricia, MD Taking Active   atorvastatin (LIPITOR) 40 MG tablet [id number] Yes TAKE 1 TABLET BY MOUTH DAILY Philip Aspen, Limmie Patricia, MD Taking Active   calcium citrate (CALCITRATE - DOSED IN MG ELEMENTAL CALCIUM) 950 (200 Ca) MG tablet [id number] Yes Take 200 mg of elemental calcium by mouth daily. [provider] Taking Active   Carboxymethylcell-Hypromellose [facility] OP) [id number]  Apply to eye. [provider]  Active   Carboxymethylcellulose Sodium (LUBRICANT EYE DROPS OP) [id number] Yes Apply 2 drops to eye 4 (four) times daily as needed (dry eyes).  [provider] Taking Active Self  estradiol (ESTRACE) 0.1 MG/GM vaginal cream [id number] Yes Place 0.5 g vaginally 2 (two) times a week. Place 0.5g nightly for two weeks then twice a week after Selmer Dominion, NP Taking Active   HYDROcodone-acetaminophen (NORCO) 7.5-325 MG tablet [id number] Yes Take 1 tablet by mouth every 6 (six) hours as needed for moderate pain (pain score 4-6). Philip Aspen, Limmie Patricia,  MD Taking Active   HYDROcodone-acetaminophen (NORCO) 7.5-325 MG tablet [id number]  Take 1 tablet by mouth every 6 (six) hours as needed for moderate pain (pain score 4-6). Philip Aspen, Limmie Patricia, MD  Active   HYDROcodone-acetaminophen [facility]) 7.5-325 MG tablet [id number]  Take 1 tablet by mouth every 6 (six) hours as needed for moderate pain (pain score 4-6).  Philip Aspen, Limmie Patricia, MD  Active   hydrocortisone 2.5 % cream [id number] Yes APPLY TO AFFECTED AREA(S)  TOPICALLY TWICE DAILY Philip Aspen, Limmie Patricia, MD Taking Active   Lancets [facility] ULTRASOFT) lancets [id number]  Use daily Gordy Savers, MD  Active Self  levothyroxine (SYNTHROID) 125 MCG tablet [id number] Yes TAKE 1 TABLET BY MOUTH DAILY Philip Aspen, Limmie Patricia, MD Taking Active   lisinopril (ZESTRIL) 10 MG tablet [id number] Yes TAKE 1 TABLET BY MOUTH DAILY Philip Aspen, Limmie Patricia, MD Taking Active   meloxicam (MOBIC) 15 MG tablet [id number] Yes TAKE 1 TABLET BY MOUTH DAILY Philip Aspen, Limmie Patricia, MD Taking Active   metFORMIN (GLUCOPHAGE) 1000 MG tablet [id number] Yes TAKE 1 TABLET BY MOUTH TWICE  DAILY WITH MEALS Philip Aspen, Limmie Patricia, MD Taking Active   Misc. Devices (BARIATRIC ROLLATOR) MISC [id number]  1 Rollator with basket. Philip Aspen, Limmie Patricia, MD  Active   Multiple Vitamin (MULTIVITAMIN) tablet [id number] Yes Take 1 tablet by mouth daily. [provider] Taking Active Self  Omega-3 Fatty Acids (FISH OIL) 1200 MG CAPS [id number] Yes Take 1 capsule by mouth daily.  [provider] Taking Active Self  ONE TOUCH ULTRA TEST test strip [id number]  USE 1 DAILY AS DIRECTED Gordy Savers, MD  Active Self  pantoprazole (PROTONIX) 40 MG tablet [id number] Yes Take 1 tablet (40 mg total) by mouth 2 (two) times daily before a meal. Philip Aspen, Limmie Patricia, MD Taking Active   pioglitazone (ACTOS) 15 MG tablet [id number] Yes TAKE 1 TABLET BY MOUTH DAILY Philip Aspen, Limmie Patricia, MD Taking Active   pramipexole (MIRAPEX) 0.125 MG tablet [id number] Yes Take 1 tablet (0.125 mg total) by mouth at bedtime. Philip Aspen, Limmie Patricia, MD Taking Active            Med Note Sherrill Raring   Wed May 04, 2023  1:34 PM)    pregabalin (LYRICA) 75 MG capsule [id number] Yes TAKE 1 CAPSULE BY MOUTH TWICE  DAILY Philip Aspen, Limmie Patricia, MD Taking Active    tiZANidine (ZANAFLEX) 4 MG tablet [id number] Yes TAKE 1 TABLET BY MOUTH 3 TIMES  DAILY AS NEEDED Philip Aspen, Limmie Patricia, MD Taking Active   tobramycin (TOBREX) 0.3 % ophthalmic solution [id number]  Place 1 drop into both eyes every 6 (six) hours. Day before injection, day of injection and day after injection [provider]  Active   triamcinolone cream (KENALOG) 0.1 % [id number]  Apply 1 Application topically 2 (two) times daily. Philip Aspen, Limmie Patricia, MD  Active   Vibegron 75 MG TABS [id number]  Take 1 tablet (75 mg total) by mouth daily. Selmer Dominion, NP  Active   vortioxetine HBr (TRINTELLIX) 5 MG TABS tablet [id number]  Take 10 mg by mouth daily. [provider]  Active               Assessment/Plan:   Diabetes: - Currently controlled - Reviewed long term cardiovascular and renal outcomes of uncontrolled blood sugar - Reviewed goal A1c, goal fasting, and goal 2 hour post prandial glucose -Patient is currently waiting for LIS approval, will  re-evaluate in 1 month of mounjaro is an option  Hypertension: - Currently uncontrolled - Reviewed long term cardiovascular and renal outcomes of uncontrolled blood pressure - Recommend to continue current medication therapy. Consider increase in ACE/ARB if remains elevated    Depression/Anxiety: - Currently controlled but wanting to come off xanax - Recommend to split xanax tab in half first to see if that helps         Follow Up Plan: 1 month  Sherrill Raring, PharmD Clinical Pharmacist [phone]

## 2023-05-13 ENCOUNTER — Other Ambulatory Visit: Payer: Self-pay | Admitting: Internal Medicine

## 2023-05-30 ENCOUNTER — Ambulatory Visit: Admitting: Podiatry

## 2023-05-30 ENCOUNTER — Inpatient Hospital Stay (HOSPITAL_COMMUNITY)
Admission: EM | Admit: 2023-05-30 | Discharge: 2023-06-01 | DRG: 617 | Disposition: A | Discharge status: Home Health | Source: Ambulatory Visit | Attending: Family Medicine | Admitting: Family Medicine

## 2023-05-30 ENCOUNTER — Ambulatory Visit (INDEPENDENT_AMBULATORY_CARE_PROVIDER_SITE_OTHER)

## 2023-05-30 VITALS — BP 149/73 | Temp 99.0°F

## 2023-05-30 DIAGNOSIS — E11621 Type 2 diabetes mellitus with foot ulcer: Secondary | ICD-10-CM | POA: Diagnosis not present

## 2023-05-30 DIAGNOSIS — E875 Hyperkalemia: Secondary | ICD-10-CM | POA: Diagnosis not present

## 2023-05-30 DIAGNOSIS — M7732 Calcaneal spur, left foot: Secondary | ICD-10-CM | POA: Diagnosis not present

## 2023-05-30 DIAGNOSIS — M1712 Unilateral primary osteoarthritis, left knee: Secondary | ICD-10-CM | POA: Diagnosis present

## 2023-05-30 DIAGNOSIS — G8929 Other chronic pain: Secondary | ICD-10-CM | POA: Diagnosis present

## 2023-05-30 DIAGNOSIS — G2581 Restless legs syndrome: Secondary | ICD-10-CM | POA: Diagnosis present

## 2023-05-30 DIAGNOSIS — L03116 Cellulitis of left lower limb: Principal | ICD-10-CM | POA: Diagnosis present

## 2023-05-30 DIAGNOSIS — E13621 Other specified diabetes mellitus with foot ulcer: Secondary | ICD-10-CM | POA: Diagnosis not present

## 2023-05-30 DIAGNOSIS — E1169 Type 2 diabetes mellitus with other specified complication: Secondary | ICD-10-CM | POA: Diagnosis not present

## 2023-05-30 DIAGNOSIS — E890 Postprocedural hypothyroidism: Secondary | ICD-10-CM | POA: Diagnosis present

## 2023-05-30 DIAGNOSIS — M86172 Other acute osteomyelitis, left ankle and foot: Secondary | ICD-10-CM

## 2023-05-30 DIAGNOSIS — Z6838 Body mass index (BMI) 38.0-38.9, adult: Secondary | ICD-10-CM

## 2023-05-30 DIAGNOSIS — N1832 Chronic kidney disease, stage 3b: Secondary | ICD-10-CM | POA: Diagnosis present

## 2023-05-30 DIAGNOSIS — Z888 Allergy status to other drugs, medicaments and biological substances status: Secondary | ICD-10-CM

## 2023-05-30 DIAGNOSIS — L97529 Non-pressure chronic ulcer of other part of left foot with unspecified severity: Secondary | ICD-10-CM | POA: Diagnosis present

## 2023-05-30 DIAGNOSIS — Z96652 Presence of left artificial knee joint: Secondary | ICD-10-CM | POA: Diagnosis present

## 2023-05-30 DIAGNOSIS — E1141 Type 2 diabetes mellitus with diabetic mononeuropathy: Secondary | ICD-10-CM | POA: Diagnosis present

## 2023-05-30 DIAGNOSIS — F419 Anxiety disorder, unspecified: Secondary | ICD-10-CM | POA: Diagnosis present

## 2023-05-30 DIAGNOSIS — M869 Osteomyelitis, unspecified: Secondary | ICD-10-CM

## 2023-05-30 DIAGNOSIS — Z7984 Long term (current) use of oral hypoglycemic drugs: Secondary | ICD-10-CM

## 2023-05-30 DIAGNOSIS — Z87891 Personal history of nicotine dependence: Secondary | ICD-10-CM

## 2023-05-30 DIAGNOSIS — Z8249 Family history of ischemic heart disease and other diseases of the circulatory system: Secondary | ICD-10-CM

## 2023-05-30 DIAGNOSIS — R000 Tachycardia, unspecified: Secondary | ICD-10-CM | POA: Diagnosis present

## 2023-05-30 DIAGNOSIS — Z961 Presence of intraocular lens: Secondary | ICD-10-CM | POA: Diagnosis present

## 2023-05-30 DIAGNOSIS — E11628 Type 2 diabetes mellitus with other skin complications: Secondary | ICD-10-CM | POA: Diagnosis present

## 2023-05-30 DIAGNOSIS — L97509 Non-pressure chronic ulcer of other part of unspecified foot with unspecified severity: Secondary | ICD-10-CM | POA: Diagnosis not present

## 2023-05-30 DIAGNOSIS — M79672 Pain in left foot: Secondary | ICD-10-CM

## 2023-05-30 DIAGNOSIS — E1122 Type 2 diabetes mellitus with diabetic chronic kidney disease: Secondary | ICD-10-CM | POA: Diagnosis present

## 2023-05-30 DIAGNOSIS — Z825 Family history of asthma and other chronic lower respiratory diseases: Secondary | ICD-10-CM

## 2023-05-30 DIAGNOSIS — N179 Acute kidney failure, unspecified: Secondary | ICD-10-CM | POA: Diagnosis present

## 2023-05-30 DIAGNOSIS — I129 Hypertensive chronic kidney disease with stage 1 through stage 4 chronic kidney disease, or unspecified chronic kidney disease: Secondary | ICD-10-CM | POA: Diagnosis not present

## 2023-05-30 DIAGNOSIS — R600 Localized edema: Secondary | ICD-10-CM | POA: Diagnosis not present

## 2023-05-30 DIAGNOSIS — Z79891 Long term (current) use of opiate analgesic: Secondary | ICD-10-CM

## 2023-05-30 DIAGNOSIS — E66813 Obesity, class 3: Secondary | ICD-10-CM | POA: Diagnosis present

## 2023-05-30 DIAGNOSIS — L02612 Cutaneous abscess of left foot: Secondary | ICD-10-CM | POA: Diagnosis not present

## 2023-05-30 DIAGNOSIS — N814 Uterovaginal prolapse, unspecified: Secondary | ICD-10-CM | POA: Diagnosis present

## 2023-05-30 DIAGNOSIS — Z9842 Cataract extraction status, left eye: Secondary | ICD-10-CM

## 2023-05-30 DIAGNOSIS — M4316 Spondylolisthesis, lumbar region: Secondary | ICD-10-CM | POA: Diagnosis not present

## 2023-05-30 DIAGNOSIS — Z9889 Other specified postprocedural states: Secondary | ICD-10-CM | POA: Diagnosis not present

## 2023-05-30 DIAGNOSIS — E785 Hyperlipidemia, unspecified: Secondary | ICD-10-CM | POA: Diagnosis not present

## 2023-05-30 DIAGNOSIS — L03032 Cellulitis of left toe: Secondary | ICD-10-CM

## 2023-05-30 DIAGNOSIS — Z9841 Cataract extraction status, right eye: Secondary | ICD-10-CM

## 2023-05-30 DIAGNOSIS — L089 Local infection of the skin and subcutaneous tissue, unspecified: Secondary | ICD-10-CM

## 2023-05-30 DIAGNOSIS — Z885 Allergy status to narcotic agent status: Secondary | ICD-10-CM

## 2023-05-30 DIAGNOSIS — Z85828 Personal history of other malignant neoplasm of skin: Secondary | ICD-10-CM

## 2023-05-30 DIAGNOSIS — Z800 Family history of malignant neoplasm of digestive organs: Secondary | ICD-10-CM

## 2023-05-30 DIAGNOSIS — Z91048 Other nonmedicinal substance allergy status: Secondary | ICD-10-CM

## 2023-05-30 DIAGNOSIS — M7989 Other specified soft tissue disorders: Secondary | ICD-10-CM | POA: Diagnosis not present

## 2023-05-30 DIAGNOSIS — Z79890 Hormone replacement therapy (postmenopausal): Secondary | ICD-10-CM

## 2023-05-30 DIAGNOSIS — N189 Chronic kidney disease, unspecified: Secondary | ICD-10-CM | POA: Diagnosis not present

## 2023-05-30 DIAGNOSIS — Z860100 Personal history of colon polyps, unspecified: Secondary | ICD-10-CM

## 2023-05-30 DIAGNOSIS — E1142 Type 2 diabetes mellitus with diabetic polyneuropathy: Secondary | ICD-10-CM | POA: Diagnosis not present

## 2023-05-30 DIAGNOSIS — M48062 Spinal stenosis, lumbar region with neurogenic claudication: Secondary | ICD-10-CM

## 2023-05-30 DIAGNOSIS — Z886 Allergy status to analgesic agent status: Secondary | ICD-10-CM

## 2023-05-30 DIAGNOSIS — Z833 Family history of diabetes mellitus: Secondary | ICD-10-CM | POA: Diagnosis not present

## 2023-05-30 LAB — COMPREHENSIVE METABOLIC PANEL WITH GFR
ALT: 24 U/L (ref 0–44)
AST: 21 U/L (ref 15–41)
Albumin: 3.1 g/dL — ABNORMAL LOW (ref 3.5–5.0)
Alkaline Phosphatase: 86 U/L (ref 38–126)
Anion gap: 12 (ref 5–15)
BUN: 41 mg/dL — ABNORMAL HIGH (ref 8–23)
CO2: 27 mmol/L (ref 22–32)
Calcium: 9.4 mg/dL (ref 8.9–10.3)
Chloride: 99 mmol/L (ref 98–111)
Creatinine, Ser: 1.58 mg/dL — ABNORMAL HIGH (ref 0.44–1.00)
GFR, Estimated: 34 mL/min — ABNORMAL LOW (ref >60)
Glucose, Bld: 88 mg/dL (ref 70–99)
Potassium: 5.4 mmol/L — ABNORMAL HIGH (ref 3.5–5.1)
Sodium: 138 mmol/L (ref 135–145)
Total Bilirubin: 0.3 mg/dL (ref 0.0–1.2)
Total Protein: 6.8 g/dL (ref 6.5–8.1)

## 2023-05-30 LAB — CBC WITH DIFFERENTIAL/PLATELET
Abs Immature Granulocytes: 0.04 10*3/uL (ref 0.00–0.07)
Basophils Absolute: 0 10*3/uL (ref 0.0–0.1)
Basophils Relative: 0 %
Eosinophils Absolute: 0.2 10*3/uL (ref 0.0–0.5)
Eosinophils Relative: 2 %
HCT: 34 % — ABNORMAL LOW (ref 36.0–46.0)
Hemoglobin: 11 g/dL — ABNORMAL LOW (ref 12.0–15.0)
Immature Granulocytes: 1 %
Lymphocytes Relative: 12 %
Lymphs Abs: 1 10*3/uL (ref 0.7–4.0)
MCH: 28.3 pg (ref 26.0–34.0)
MCHC: 32.4 g/dL (ref 30.0–36.0)
MCV: 87.4 fL (ref 80.0–100.0)
Monocytes Absolute: 0.6 10*3/uL (ref 0.1–1.0)
Monocytes Relative: 7 %
Neutro Abs: 6.7 10*3/uL (ref 1.7–7.7)
Neutrophils Relative %: 78 %
Platelets: 232 10*3/uL (ref 150–400)
RBC: 3.89 MIL/uL (ref 3.87–5.11)
RDW: 12.7 % (ref 11.5–15.5)
WBC: 8.5 10*3/uL (ref 4.0–10.5)
nRBC: 0 % (ref 0.0–0.2)

## 2023-05-30 LAB — I-STAT CG4 LACTIC ACID, ED: Lactic Acid, Venous: 1.4 mmol/L (ref 0.5–1.9)

## 2023-05-30 MED ORDER — SODIUM CHLORIDE 0.9 % IV BOLUS
1000.0000 mL | Freq: Once | INTRAVENOUS | Status: AC
Start: 2023-05-30 — End: 2023-05-31
  Administered 2023-05-30: 1000 mL via INTRAVENOUS

## 2023-05-30 MED ORDER — PROCHLORPERAZINE EDISYLATE 10 MG/2ML IJ SOLN: 5.0000 mg | Freq: Four times a day (QID) | INTRAMUSCULAR | Status: DC | PRN

## 2023-05-30 MED ORDER — POLYETHYLENE GLYCOL 3350 17 G PO PACK: 17.0000 g | PACK | Freq: Every day | ORAL | Status: DC | PRN

## 2023-05-30 MED ORDER — PIPERACILLIN-TAZOBACTAM 3.375 G IVPB 30 MIN
3.3750 g | Freq: Once | INTRAVENOUS | Status: AC
  Administered 2023-05-30: 3.375 g via INTRAVENOUS
  Filled 2023-05-30: qty 50

## 2023-05-30 MED ORDER — SODIUM CHLORIDE 0.9 % IV SOLN: INTRAVENOUS | Status: AC

## 2023-05-30 MED ORDER — HEPARIN SODIUM (PORCINE) 5000 UNIT/ML IJ SOLN
5000.0000 [IU] | Freq: Three times a day (TID) | INTRAMUSCULAR | Status: DC
  Filled 2023-05-30: qty 1

## 2023-05-30 MED ORDER — ACETAMINOPHEN 325 MG PO TABS: 650.0000 mg | ORAL_TABLET | Freq: Four times a day (QID) | ORAL | Status: DC | PRN

## 2023-05-30 MED ORDER — HYDROMORPHONE HCL 1 MG/ML IJ SOLN
0.5000 mg | INTRAMUSCULAR | Status: DC | PRN
  Administered 2023-05-31 – 2023-06-01 (×3): 0.5 mg via INTRAVENOUS
  Filled 2023-05-30 (×3): qty 0.5

## 2023-05-30 MED ORDER — MELATONIN 5 MG PO TABS
5.0000 mg | ORAL_TABLET | Freq: Every evening | ORAL | Status: DC | PRN
  Administered 2023-05-31 (×2): 5 mg via ORAL
  Filled 2023-05-30 (×2): qty 1

## 2023-05-30 MED ORDER — OXYCODONE HCL 5 MG PO TABS
5.0000 mg | ORAL_TABLET | Freq: Four times a day (QID) | ORAL | Status: AC | PRN
  Administered 2023-05-31 – 2023-06-01 (×4): 5 mg via ORAL
  Filled 2023-05-30 (×5): qty 1

## 2023-05-30 MED ORDER — PIPERACILLIN-TAZOBACTAM 3.375 G IVPB 30 MIN
3.3750 g | Freq: Three times a day (TID) | INTRAVENOUS | Status: DC
  Administered 2023-05-31 – 2023-06-01 (×4): 3.375 g via INTRAVENOUS
  Filled 2023-05-30 (×4): qty 50

## 2023-05-30 MED ORDER — SODIUM ZIRCONIUM CYCLOSILICATE 10 G PO PACK
10.0000 g | PACK | Freq: Two times a day (BID) | ORAL | Status: AC
  Administered 2023-05-31: 10 g via ORAL
  Filled 2023-05-30: qty 1

## 2023-05-30 NOTE — ED Provider Notes (Signed)
 [place] EMERGENCY DEPARTMENT AT [facility] Provider Note   CSN: [id number] Arrival date & time: 05/30/23  1629     History  Chief Complaint  Patient presents with   Wound Infection    Katie Booker is a 78 y.o. female.  Pt with hx diabetes, presents from podiatry office with cellulitis, possible osteomyelitis left great toe for admission, iv abx, and possible toe amputation tomorrow. Pt indicates toe has been looking worse in past couple weeks, with spreading redness up foot and lower leg, malodor to toe wound with purulent drainage.  No fever or chills. No nausea/vomiting.   The history is provided by the patient, medical records and a relative.       Home Medications Prior to Admission medications   Medication Sig Start Date End Date Taking? Authorizing Provider  ALPRAZolam Prudy Feeler) 0.5 MG tablet Take 1 tablet (0.5 mg total) by mouth 2 (two) times daily. 04/26/23   Philip Aspen, Limmie Patricia, MD  atenolol (TENORMIN) 25 MG tablet TAKE 1 TABLET BY MOUTH DAILY 08/30/22   Philip Aspen, Limmie Patricia, MD  atorvastatin (LIPITOR) 40 MG tablet TAKE 1 TABLET BY MOUTH DAILY 03/03/23   Philip Aspen, Limmie Patricia, MD  calcium citrate (CALCITRATE - DOSED IN MG ELEMENTAL CALCIUM) 950 (200 Ca) MG tablet Take 200 mg of elemental calcium by mouth daily.    [provider]  Carboxymethylcell-Hypromellose (GENTEAL OP) Apply to eye.    [provider]  Carboxymethylcellulose Sodium (LUBRICANT EYE DROPS OP) Apply 2 drops to eye 4 (four) times daily as needed (dry eyes).     [provider]  estradiol (ESTRACE) 0.1 MG/GM vaginal cream Place 0.5 g vaginally 2 (two) times a week. Place 0.5g nightly for two weeks then twice a week after 02/24/23   Selmer Dominion, NP  HYDROcodone-acetaminophen (NORCO) 7.5-325 MG tablet Take 1 tablet by mouth every 6 (six) hours as needed for moderate pain (pain score 4-6). 04/26/23   Philip Aspen, Limmie Patricia, MD   HYDROcodone-acetaminophen (NORCO) 7.5-325 MG tablet Take 1 tablet by mouth every 6 (six) hours as needed for moderate pain (pain score 4-6). 04/26/23   Philip Aspen, Limmie Patricia, MD  HYDROcodone-acetaminophen (NORCO) 7.5-325 MG tablet Take 1 tablet by mouth every 6 (six) hours as needed for moderate pain (pain score 4-6). 04/26/23   Philip Aspen, Limmie Patricia, MD  hydrocortisone 2.5 % cream APPLY TO AFFECTED AREA(S)  TOPICALLY TWICE DAILY 08/30/22   Philip Aspen, Limmie Patricia, MD  Lancets [facility] ULTRASOFT) lancets Use daily 11/12/11   Gordy Savers, MD  levothyroxine (SYNTHROID) 125 MCG tablet TAKE 1 TABLET BY MOUTH DAILY 03/10/23   Philip Aspen, Limmie Patricia, MD  lisinopril (ZESTRIL) 10 MG tablet TAKE 1 TABLET BY MOUTH DAILY 05/16/23   Philip Aspen, Limmie Patricia, MD  meloxicam (MOBIC) 15 MG tablet TAKE 1 TABLET BY MOUTH DAILY 12/07/22   Philip Aspen, Limmie Patricia, MD  metFORMIN (GLUCOPHAGE) 1000 MG tablet TAKE 1 TABLET BY MOUTH TWICE  DAILY WITH MEALS 02/10/23   Philip Aspen, Limmie Patricia, MD  Misc. Devices (BARIATRIC ROLLATOR) MISC 1 Rollator with basket. 05/02/20   Philip Aspen, Limmie Patricia, MD  Multiple Vitamin (MULTIVITAMIN) tablet Take 1 tablet by mouth daily.    [provider]  Omega-3 Fatty Acids (FISH OIL) 1200 MG CAPS Take 1 capsule by mouth daily.     [provider]  ONE TOUCH ULTRA TEST test strip USE 1 DAILY AS DIRECTED 05/12/17   Eleonore Chiquito  F, MD  pantoprazole (PROTONIX) 40 MG tablet Take 1 tablet (40 mg total) by mouth 2 (two) times daily before a meal. 11/25/22   Philip Aspen, Limmie Patricia, MD  pioglitazone (ACTOS) 15 MG tablet TAKE 1 TABLET BY MOUTH DAILY 03/03/23   Philip Aspen, Limmie Patricia, MD  pramipexole (MIRAPEX) 0.125 MG tablet Take 1 tablet (0.125 mg total) by mouth at bedtime. 05/25/22   Philip Aspen, Limmie Patricia, MD  pregabalin (LYRICA) 75 MG capsule TAKE 1 CAPSULE BY MOUTH TWICE  DAILY 01/24/23   Philip Aspen, Limmie Patricia, MD  tiZANidine  (ZANAFLEX) 4 MG tablet TAKE 1 TABLET BY MOUTH 3 TIMES  DAILY AS NEEDED 05/04/23   Philip Aspen, Limmie Patricia, MD  tobramycin (TOBREX) 0.3 % ophthalmic solution Place 1 drop into both eyes every 6 (six) hours. Day before injection, day of injection and day after injection 11/26/19   [provider]  triamcinolone cream (KENALOG) 0.1 % Apply 1 Application topically 2 (two) times daily. 01/10/23   Philip Aspen, Limmie Patricia, MD  vortioxetine HBr (TRINTELLIX) 5 MG TABS tablet Take 10 mg by mouth daily.    [provider]      Allergies    Duraprep [antiseptic products, misc.]; Advil allergy sinus [chlorpheniramine-pse-ibuprofen]; Betadine [povidone iodine]; Hydromorphone hcl; and Morphine and codeine    Review of Systems   Review of Systems  Constitutional:  Negative for chills and fever.  Respiratory:  Negative for shortness of breath.   Cardiovascular:  Negative for chest pain.  Gastrointestinal:  Negative for abdominal pain, nausea and vomiting.  Musculoskeletal:  Negative for myalgias.  Neurological:  Negative for headaches.    Physical Exam Updated Vital Signs BP 136/74 (BP Location: Right Arm)   Pulse 93   Temp 98.4 F (36.9 C)   Resp 20   SpO2 100%  Physical Exam Vitals and nursing note reviewed.  Constitutional:      Appearance: Normal appearance. She is well-developed.  HENT:     Head: Atraumatic.     Nose: Nose normal.  Eyes:     General: No scleral icterus.    Conjunctiva/sclera: Conjunctivae normal.  Neck:     Trachea: No tracheal deviation.  Cardiovascular:     Rate and Rhythm: Normal rate and regular rhythm.     Pulses: Normal pulses.     Heart sounds: Normal heart sounds. No murmur heard.    No friction rub. No gallop.  Pulmonary:     Effort: Pulmonary effort is normal. No respiratory distress.     Breath sounds: Normal breath sounds.  Musculoskeletal:        General: No swelling.     Cervical back: Neck supple. No muscular tenderness.      Comments: Swelling and erythema to left great toe, malodorous discharge. Cellulitis extending up foot and lower leg. No crepitus. Distal pulse palp.   Skin:    General: Skin is warm and dry.     Findings: No rash.  Neurological:     Mental Status: She is alert.     Comments: Alert, speech normal.   Psychiatric:        Mood and Affect: Mood normal.     ED Results / Procedures / Treatments   Labs (all labs ordered are listed, but only abnormal results are displayed) Results for orders placed or performed during the hospital encounter of 05/30/23  Comprehensive metabolic panel   Collection Time: 05/30/23  5:18 PM  Result Value Ref Range   Sodium 138  135 - 145 mmol/L   Potassium 5.4 (H) 3.5 - 5.1 mmol/L   Chloride 99 98 - 111 mmol/L   CO2 27 22 - 32 mmol/L   Glucose, Bld 88 70 - 99 mg/dL   BUN 41 (H) 8 - 23 mg/dL   Creatinine, Ser 7.82 (H) 0.44 - 1.00 mg/dL   Calcium 9.4 8.9 - 95.6 mg/dL   Total Protein 6.8 6.5 - 8.1 g/dL   Albumin 3.1 (L) 3.5 - 5.0 g/dL   AST 21 15 - 41 U/L   ALT 24 0 - 44 U/L   Alkaline Phosphatase 86 38 - 126 U/L   Total Bilirubin 0.3 0.0 - 1.2 mg/dL   GFR, Estimated 34 (L) >60 mL/min   Anion gap 12 5 - 15  CBC with Differential   Collection Time: 05/30/23  5:18 PM  Result Value Ref Range   WBC 8.5 4.0 - 10.5 K/uL   RBC 3.89 3.87 - 5.11 MIL/uL   Hemoglobin 11.0 (L) 12.0 - 15.0 g/dL   HCT 21.3 (L) 08.6 - 57.8 %   MCV 87.4 80.0 - 100.0 fL   MCH 28.3 26.0 - 34.0 pg   MCHC 32.4 30.0 - 36.0 g/dL   RDW 46.9 62.9 - 52.8 %   Platelets 232 150 - 400 K/uL   nRBC 0.0 0.0 - 0.2 %   Neutrophils Relative % 78 %   Neutro Abs 6.7 1.7 - 7.7 K/uL   Lymphocytes Relative 12 %   Lymphs Abs 1.0 0.7 - 4.0 K/uL   Monocytes Relative 7 %   Monocytes Absolute 0.6 0.1 - 1.0 K/uL   Eosinophils Relative 2 %   Eosinophils Absolute 0.2 0.0 - 0.5 K/uL   Basophils Relative 0 %   Basophils Absolute 0.0 0.0 - 0.1 K/uL   Immature Granulocytes 1 %   Abs Immature Granulocytes 0.04  0.00 - 0.07 K/uL  I-Stat Lactic Acid, ED   Collection Time: 05/30/23  5:25 PM  Result Value Ref Range   Lactic Acid, Venous 1.4 0.5 - 1.9 mmol/L   DG Foot Complete Left Result Date: 05/30/2023 Please see detailed radiograph report in office note.    EKG None  Radiology DG Foot Complete Left Result Date: 05/30/2023 Please see detailed radiograph report in office note.   Procedures Procedures    Medications Ordered in ED Medications  sodium chloride 0.9 % bolus 1,000 mL (has no administration in time range)  piperacillin-tazobactam (ZOSYN) IVPB 3.375 g (has no administration in time range)    ED Course/ Medical Decision Making/ A&P                                 Medical Decision Making Problems Addressed: AKI (acute kidney injury) [facility]): acute illness or injury Cellulitis of left foot: acute illness or injury with systemic symptoms that poses a threat to life or bodily functions Diabetic foot infection (HCC): acute illness or injury with systemic symptoms that poses a threat to life or bodily functions Hyperkalemia: acute illness or injury that poses a threat to life or bodily functions Osteomyelitis of great toe of left foot (HCC): acute illness or injury with systemic symptoms that poses a threat to life or bodily functions  Amount and/or Complexity of Data Reviewed Independent Historian:     Details: Family/friend External Data Reviewed: labs, radiology and notes. Labs: ordered. Decision-making details documented in ED Course. Radiology: independent interpretation performed. Decision-making details documented  in ED Course. ECG/medicine tests: ordered. Discussion of management or test interpretation with external provider(s): Hospitalists/medicine  Risk Prescription drug management. Decision regarding hospitalization.  Iv ns.Labs ordered/sent. Imaging reviewed.  Differential diagnosis includes cellulitis, osteomyelitis, diabetic foot infection, etc. Dispo  decision including potential need for admission considered - will get labs and review today's imaging and reassess.   Reviewed nursing notes and prior charts for additional history. External reports reviewed. Additional history from: family/friend.   Labs reviewed/interpreted by me - aki. NS bolus.   Xrays reviewed/interpreted by me - cortical irreg ?osteo  Zosyn iv. Ns bolus.   Medicine consulted for admission.   CRITICAL CARE RE: diabetic foot infection/osteomyelitis/cellulitis with aki and hyperkalemia, parenteral ivf resuscitation, parenteral abx therapy.  Performed by: Suzi Roots Total critical care time: 40 minutes Critical care time was exclusive of separately billable procedures and treating other patients. Critical care was necessary to treat or prevent imminent or life-threatening deterioration. Critical care was time spent personally by me on the following activities: development of treatment plan with patient and/or surrogate as well as nursing, discussions with consultants, evaluation of patient's response to treatment, examination of patient, obtaining history from patient or surrogate, ordering and performing treatments and interventions, ordering and review of laboratory studies, ordering and review of radiographic studies, pulse oximetry and re-evaluation of patient's condition.          Final Clinical Impression(s) / ED Diagnoses Final diagnoses:  None    Rx / DC Orders ED Discharge Orders     None         Cathren Laine, MD 05/30/23 2201

## 2023-05-30 NOTE — H&P (Signed)
 History and Physical  Katie Booker JYN:[id number] DOB: Aug 25, 1945 DOA: 05/30/2023  Referring physician: Dr. Denton Lank, EDP  PCP: Philip Aspen, Limmie Patricia, MD  Outpatient Specialists: Podiatry. Patient coming from: Home.  Chief Complaint: Left foot ulcer  HPI: Katie Booker is a 78 y.o. female with medical history significant for type 2 diabetes, diabetic polyneuropathy, hyperlipidemia, hypertension, restless leg syndrome, iron deficiency anemia, hypothyroidism, obesity, who presents to the ER sent by her podiatrist due to concerns for left great toe diabetic ulcer with infection.  The patient endorses noticing skin peeling off her left great Toe about a week ago.  2 days ago she noticed a hole at the bottom part of her left big toe.  Denies subjective fevers or chills.  She presented to the ER for further evaluation.  In the ER, hemodynamically stable.  X-ray of the left foot revealed cortical changes of the distal phalanx consistent with osteomyelitis.  Lab studies notable for elevated potassium 5.4, BUN 41, creatinine 1.58 from baseline of 1.12, GFR 34.  Received IV fluid.  Podiatry recommended MRI and IV antibiotics.  The patient was started on IV Zosyn empirically.  TR[facility], hospitalist service, was asked to admit.  ED Course: Temperature 98.5.  BP 94/77, pulse 76, respiration rate 18, O2 saturation 99% on room air.  Review of Systems: Review of systems as noted in the HPI. All other systems reviewed and are negative.   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   Depression    DOE (dyspnea on exertion)    GERD    Headache    Hemorrhoids    History of basal cell carcinoma excision    left cheek   History of colon  polyps    History of hyperthyroidism 2011   RAI treatement   History of panic attacks    Hyperlipidemia    Hypertension    Hypothyroidism, postradioiodine therapy    followed by pcp   IDA (iron deficiency anemia)    intermittant   Insomnia    OA (osteoarthritis)    OSA (obstructive sleep apnea)    no cpap, per pt tried unable to tolerate   Peripheral neuropathy    legs and feet from hx back surgery's   Rash    bilateral axilla area -- per pt due to some personal wipes used other the counter   RLS (restless legs syndrome)    Spondylolisthesis at L1-L2 level    Tingling of both upper extremities    left greater than right due to cervical pinched nerve   Type 2 diabetes mellitus (HCC)    followed by pcp   Umbilical hernia    Urinary frequency    Urinary urgency    Past Surgical History:  Procedure Laterality Date   ANTERIOR CERVICAL DECOMP/DISCECTOMY FUSION N/A 09/02/2014   Procedure: Cervical five- six  Anterior cervical decompression fusion;  Surgeon: Barnett Abu, MD;  Location: [facility];  Service: Neurosurgery;  Laterality: N/A;  C5-6 Anterior cervical decompression/diskectomy/fusion   BREAST BIOPSY Right 10/2018   x 2 benign   CARPAL TUNNEL RELEASE Right 2016   CATARACT EXTRACTION W/ INTRAOCULAR LENS  IMPLANT, BILATERAL  03/2015   COLONOSCOPY  2007   ESOPHAGOGASTRODUODENOSCOPY  2007   HEMORRHOID SURGERY  03/23/2011   Procedure: HEMORRHOIDECTOMY;  Surgeon: Clovis Pu. Cornett, MD;  Location: [facility];  Service: General;  Laterality: N/A;  lateral internal sphincterotomy and hemorrhoidectomy   LUMBAR FUSION  02/2015   L1 -- 2   PARTIAL KNEE ARTHROPLASTY Left 12/27/2017   Procedure: UNICOMPARTMENTAL LEFT KNEE;  Surgeon: Teryl Lucy, MD;  Location: [facility];  Service: Orthopedics;  Laterality: Left;   THORACIC DISCECTOMY N/A 12/15/2012   Procedure: Thoracic ten-eleven Thoracic laminectomy;  Surgeon: Barnett Abu, MD;  Location: [facility];  Service:  Neurosurgery;  Laterality: N/A;  Thoracic ten-eleven Thoracic laminectomy   TUBAL LIGATION Bilateral 1991    Social History:  reports that she quit smoking about 19 years ago. Her smoking use included cigarettes. She started smoking about 39 years ago. She has a 20 pack-year smoking history. She has never used smokeless tobacco. She reports current alcohol use. She reports that she does not use drugs.   Allergies  Allergen Reactions   Duraprep [Antiseptic Products, Misc.] Itching and Rash    Irritation everywhere prep was used   Advil Allergy Sinus [Chlorpheniramine-Pse-Ibuprofen]     Nervousness    Betadine [Povidone Iodine] Other (See Comments)    Burning sensation.    Hydromorphone Hcl Other (See Comments)    Makes crazy   Morphine And Codeine Other (See Comments)    hallucinations    Family History  Problem Relation Age of Onset   Heart failure Mother    Diabetes Father    COPD Father    Cancer Maternal Grandfather        stomach      Prior to Admission medications   Medication Sig Start Date End Date Taking? Authorizing Provider  ALPRAZolam Prudy Feeler) 0.5 MG tablet Take 1 tablet (0.5 mg total) by mouth 2 (two) times daily. 04/26/23   Philip Aspen, Limmie Patricia, MD  atenolol (TENORMIN) 25 MG tablet TAKE 1 TABLET BY MOUTH DAILY 08/30/22   Philip Aspen, Limmie Patricia, MD  atorvastatin (LIPITOR) 40 MG tablet TAKE 1 TABLET BY MOUTH DAILY 03/03/23   Philip Aspen, Limmie Patricia, MD  calcium citrate (CALCITRATE - DOSED IN MG ELEMENTAL CALCIUM) 950 (200 Ca) MG tablet Take 200 mg of elemental calcium by mouth daily.    [provider]  Carboxymethylcell-Hypromellose (GENTEAL OP) Apply to eye.    [provider]  Carboxymethylcellulose Sodium (LUBRICANT EYE DROPS OP) Apply 2 drops to eye 4 (four) times daily as needed (dry eyes).     [provider]  estradiol (ESTRACE) 0.1 MG/GM vaginal cream Place 0.5 g vaginally 2 (two) times a week. Place 0.5g nightly for two  weeks then twice a week after 02/24/23   Selmer Dominion, NP  HYDROcodone-acetaminophen (NORCO) 7.5-325 MG tablet Take 1 tablet by mouth every 6 (six) hours as needed for moderate pain (pain score 4-6). 04/26/23   Philip Aspen, Limmie Patricia, MD  HYDROcodone-acetaminophen (NORCO) 7.5-325 MG tablet Take 1 tablet by mouth every 6 (six) hours as needed for moderate pain (pain score 4-6). 04/26/23   Philip Aspen, Limmie Patricia, MD  HYDROcodone-acetaminophen (NORCO) 7.5-325 MG tablet Take 1 tablet by mouth every 6 (six) hours as needed for moderate pain (pain score 4-6). 04/26/23   Philip Aspen, Limmie Patricia, MD  hydrocortisone 2.5 % cream APPLY TO AFFECTED AREA(S)  TOPICALLY TWICE DAILY 08/30/22   Philip Aspen, Limmie Patricia, MD  Lancets [facility] ULTRASOFT)  lancets Use daily 11/12/11   Gordy Savers, MD  levothyroxine (SYNTHROID) 125 MCG tablet TAKE 1 TABLET BY MOUTH DAILY 03/10/23   Philip Aspen, Limmie Patricia, MD  lisinopril (ZESTRIL) 10 MG tablet TAKE 1 TABLET BY MOUTH DAILY 05/16/23   Philip Aspen, Limmie Patricia, MD  meloxicam (MOBIC) 15 MG tablet TAKE 1 TABLET BY MOUTH DAILY 12/07/22   Philip Aspen, Limmie Patricia, MD  metFORMIN (GLUCOPHAGE) 1000 MG tablet TAKE 1 TABLET BY MOUTH TWICE  DAILY WITH MEALS 02/10/23   Philip Aspen, Limmie Patricia, MD  Misc. Devices (BARIATRIC ROLLATOR) MISC 1 Rollator with basket. 05/02/20   Philip Aspen, Limmie Patricia, MD  Multiple Vitamin (MULTIVITAMIN) tablet Take 1 tablet by mouth daily.    [provider]  Omega-3 Fatty Acids (FISH OIL) 1200 MG CAPS Take 1 capsule by mouth daily.     [provider]  ONE TOUCH ULTRA TEST test strip USE 1 DAILY AS DIRECTED 05/12/17   Gordy Savers, MD  pantoprazole (PROTONIX) 40 MG tablet Take 1 tablet (40 mg total) by mouth 2 (two) times daily before a meal. 11/25/22   Philip Aspen, Limmie Patricia, MD  pioglitazone (ACTOS) 15 MG tablet TAKE 1 TABLET BY MOUTH DAILY 03/03/23   Philip Aspen, Limmie Patricia, MD  pramipexole (MIRAPEX)  0.125 MG tablet Take 1 tablet (0.125 mg total) by mouth at bedtime. 05/25/22   Philip Aspen, Limmie Patricia, MD  pregabalin (LYRICA) 75 MG capsule TAKE 1 CAPSULE BY MOUTH TWICE  DAILY 01/24/23   Philip Aspen, Limmie Patricia, MD  tiZANidine (ZANAFLEX) 4 MG tablet TAKE 1 TABLET BY MOUTH 3 TIMES  DAILY AS NEEDED 05/04/23   Philip Aspen, Limmie Patricia, MD  tobramycin (TOBREX) 0.3 % ophthalmic solution Place 1 drop into both eyes every 6 (six) hours. Day before injection, day of injection and day after injection 11/26/19   [provider]  triamcinolone cream (KENALOG) 0.1 % Apply 1 Application topically 2 (two) times daily. 01/10/23   Philip Aspen, Limmie Patricia, MD  vortioxetine HBr (TRINTELLIX) 5 MG TABS tablet Take 10 mg by mouth daily.    [provider]    Physical Exam: BP 136/74 (BP Location: Right Arm)   Pulse 93   Temp 98.4 F (36.9 C)   Resp 20   SpO2 100%   General: 77 y.o. year-old female well developed well nourished in no acute distress.  Alert and oriented x3. Cardiovascular: Regular rate and rhythm with no rubs or gallops.  No thyromegaly or JVD noted.  No lower extremity edema. 2/4 pulses in all 4 extremities. Respiratory: Clear to auscultation with no wheezes or rales. Good inspiratory effort. Abdomen: Soft nontender nondistended with normal bowel sounds x4 quadrants. Muskuloskeletal: No cyanosis, clubbing or edema noted bilaterally Neuro: CN II-XII intact, strength, sensation, reflexes Skin:  Psychiatry: Judgement and insight appear normal. Mood is appropriate for condition and setting          Labs on Admission:  Basic Metabolic Panel: Recent Labs  Lab 05/30/23 1718  NA 138  K 5.4*  CL 99  CO2 27  GLUCOSE 88  BUN 41*  CREATININE 1.58*  CALCIUM 9.4   Liver Function Tests: Recent Labs  Lab 05/30/23 1718  AST 21  ALT 24  ALKPHOS 86  BILITOT 0.3  PROT 6.8  ALBUMIN 3.1*   No results for input(s): "LIPASE", "AMYLASE" in the last 168 hours. No  results for input(s): "AMMONIA" in the last 168 hours. CBC: Recent Labs  Lab 05/30/23  1718  WBC 8.5  NEUTROABS 6.7  HGB 11.0*  HCT 34.0*  MCV 87.4  PLT 232   Cardiac Enzymes: No results for input(s): "CKTOTAL", "CKMB", "CKMBINDEX", "TROPONINI" in the last 168 hours.  BNP (last 3 results) No results for input(s): "BNP" in the last 8760 hours.  ProBNP (last 3 results) No results for input(s): "PROBNP" in the last 8760 hours.  CBG: No results for input(s): "GLUCAP" in the last 168 hours.  Radiological Exams on Admission: DG Foot Complete Left Result Date: 05/30/2023 Please see detailed radiograph report in office note.   EKG: I independently viewed the EKG done and my findings are as followed: None available at the time of this visit.  Assessment/Plan Present on Admission:  Diabetic foot ulcer (HCC)  Principal Problem:   Diabetic foot ulcer (HCC)  Left great toe diabetic foot ulcer, POA Continue Zosyn, initiated in the ER Follow peripheral blood cultures x 2 Follow MRI left foot, sed rate and CRP As needed analgesics Podiatry will see in consultation N.p.o. until seen by podiatry.  Hyperkalemia Serum potassium 5.4 Lokelma 10 g twice daily x 2 doses Repeat BMP in the morning  AKI on CKD 3B At baseline creatinine 1.1 with GFR 47 Presented with creatinine of 1.5 with GFR of 34 Avoid nephrotoxic agents, dehydration, and hypotension Continue IV fluid hydration NS at 50 cc/h x 1 day Repeat BMP in the morning  Hypothyroidism Resume home levothyroxine  Hyperlipidemia Resume home Lipitor  Restless leg syndrome Resume home pramipexole   Time: 75 minutes.   DVT prophylaxis: SCDs.  Defer pharmacological DVT prophylaxis to podiatry surgery  Code Status: Full code.  Family Communication: Friend at bedside.  Disposition Plan: Admitted to telemetry surgical unit.  Consults called: Podiatry.  Admission status: Inpatient status.   Status is:  Inpatient The patient requires at least 2 midnights for further evaluation and treatment of present condition.   Darlin Drop MD [facility] Pager [phone]  If 7PM-7AM, please contact night-coverage [email or web address] Password [facility]  05/30/2023, 11:29 PM

## 2023-05-30 NOTE — Progress Notes (Signed)
 Podiatry plan of care  Pt sent to ED by Dr Ardelle Anton - please see his note.   Recommend MRI and broad spectrum IV abx  Please make her NPO past MN and hold anticoagulation, possible OR in the am tmrw if OR schedule allows it, otherwise would be Wednesday for amp.   Levester Fresh DPM

## 2023-05-30 NOTE — ED Triage Notes (Signed)
 Pt referred to ED for wound check to L great toe. Pt states that she's been having issues with that foot since her Achille's rupture one and a half year ago. Pt states that two weeks ago she began noticing the wound appear with malodorous serosanguinous drainage. Denies recent fevers. Pt has hx to T2DM.

## 2023-05-30 NOTE — ED Notes (Signed)
 Called and placed on monitor with CCMD.Was told by CCMD that PT's leads are not on. This RN is currently staring at a PT with leads on. PT will be disconnected in a few because PT has a bed upstairs.

## 2023-05-30 NOTE — Progress Notes (Signed)
 Subjective:   Patient ID: Katie Booker, female   DOB: 78 y.o.   MRN: [id number]   HPI Chief Complaint  Patient presents with   Nail Problem    RM#13 Left foot big toe ingrown,left foot ulcer possible infection.   78 year old female presents the office with above concerns.  She states that she has a history of Achilles tendon tear and symptoms of her toes started to contract. Recently she started having drainage and a wound to her right great toe.  At first she thought she had an ingrown toenail but she never see any drainage from the toenail site.  Currently does not report any fevers or chills.   Review of Systems  All other systems reviewed and are negative.   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   Depression    DOE (dyspnea on exertion)    GERD    Headache    Hemorrhoids    History of basal cell carcinoma excision    left cheek   History of colon polyps    History of hyperthyroidism 2011   RAI treatement   History of panic attacks    Hyperlipidemia    Hypertension    Hypothyroidism, postradioiodine therapy    followed by pcp   IDA (iron deficiency anemia)    intermittant   Insomnia    OA (osteoarthritis)    OSA (obstructive sleep apnea)    no cpap, per pt tried unable to tolerate   Peripheral neuropathy    legs and feet from hx back surgery's   Rash    bilateral axilla area -- per pt due to some personal wipes used other the counter   RLS (restless legs syndrome)    Spondylolisthesis at L1-L2 level    Tingling of both upper extremities    left greater than right due to cervical pinched nerve   Type 2 diabetes mellitus (HCC)    followed by pcp   Umbilical hernia    Urinary frequency     Urinary urgency     Past Surgical History:  Procedure Laterality Date   ANTERIOR CERVICAL DECOMP/DISCECTOMY FUSION N/A 09/02/2014   Procedure: Cervical five- six  Anterior cervical decompression fusion;  Surgeon: Barnett Abu, MD;  Location: [facility];  Service: Neurosurgery;  Laterality: N/A;  C5-6 Anterior cervical decompression/diskectomy/fusion   BREAST BIOPSY Right 10/2018   x 2 benign   CARPAL TUNNEL RELEASE Right 2016   CATARACT EXTRACTION W/ INTRAOCULAR LENS  IMPLANT, BILATERAL  03/2015   COLONOSCOPY  2007   ESOPHAGOGASTRODUODENOSCOPY  2007   HEMORRHOID SURGERY  03/23/2011   Procedure: HEMORRHOIDECTOMY;  Surgeon: Clovis Pu. Cornett, MD;  Location: [facility];  Service: General;  Laterality: N/A;  lateral internal sphincterotomy and hemorrhoidectomy   LUMBAR FUSION  02/2015   L1 -- 2   PARTIAL KNEE ARTHROPLASTY Left 12/27/2017   Procedure: UNICOMPARTMENTAL LEFT KNEE;  Surgeon: Teryl Lucy, MD;  Location: [facility];  Service: Orthopedics;  Laterality: Left;   THORACIC DISCECTOMY N/A 12/15/2012   Procedure: Thoracic ten-eleven Thoracic laminectomy;  Surgeon: Barnett Abu, MD;  Location: [facility];  Service: Neurosurgery;  Laterality: N/A;  Thoracic ten-eleven Thoracic laminectomy   TUBAL LIGATION Bilateral 1991     Current Outpatient Medications:    ALPRAZolam (XANAX) 0.5 MG tablet, Take 1 tablet (0.5 mg total) by mouth 2 (two) times daily., Disp: 90 tablet, Rfl: 2   atenolol (TENORMIN) 25 MG tablet, TAKE 1 TABLET BY MOUTH DAILY, Disp: 100 tablet, Rfl: 2   atorvastatin (LIPITOR) 40 MG tablet, TAKE 1 TABLET BY MOUTH DAILY, Disp: 100 tablet, Rfl: 2   calcium citrate (CALCITRATE - DOSED IN MG ELEMENTAL CALCIUM) 950 (200 Ca) MG tablet, Take 200 mg of elemental calcium by mouth daily., Disp: , Rfl:    Carboxymethylcell-Hypromellose (GENTEAL OP), Apply to eye., Disp: , Rfl:    Carboxymethylcellulose Sodium (LUBRICANT EYE DROPS OP), Apply 2 drops to eye 4 (four) times  daily as needed (dry eyes). , Disp: , Rfl:    estradiol (ESTRACE) 0.1 MG/GM vaginal cream, Place 0.5 g vaginally 2 (two) times a week. Place 0.5g nightly for two weeks then twice a week after, Disp: 42.5 g, Rfl: 11   HYDROcodone-acetaminophen (NORCO) 7.5-325 MG tablet, Take 1 tablet by mouth every 6 (six) hours as needed for moderate pain (pain score 4-6)., Disp: 120 tablet, Rfl: 0   HYDROcodone-acetaminophen (NORCO) 7.5-325 MG tablet, Take 1 tablet by mouth every 6 (six) hours as needed for moderate pain (pain score 4-6)., Disp: 120 tablet, Rfl: 0   HYDROcodone-acetaminophen (NORCO) 7.5-325 MG tablet, Take 1 tablet by mouth every 6 (six) hours as needed for moderate pain (pain score 4-6)., Disp: 120 tablet, Rfl: 0   hydrocortisone 2.5 % cream, APPLY TO AFFECTED AREA(S)  TOPICALLY TWICE DAILY, Disp: 113.4 g, Rfl: 0   Lancets (ONETOUCH ULTRASOFT) lancets, Use daily, Disp: 100 each, Rfl: 6   levothyroxine (SYNTHROID) 125 MCG tablet, TAKE 1 TABLET BY MOUTH DAILY, Disp: 100 tablet, Rfl: 1   lisinopril (ZESTRIL) 10 MG tablet, TAKE 1 TABLET BY MOUTH DAILY, Disp: 100 tablet, Rfl: 1   meloxicam (MOBIC) 15 MG tablet, TAKE 1 TABLET BY MOUTH DAILY, Disp: 100 tablet, Rfl: 2   metFORMIN (GLUCOPHAGE) 1000 MG tablet, TAKE 1 TABLET BY MOUTH TWICE  DAILY WITH MEALS, Disp: 200 tablet, Rfl: 1   Misc. Devices (BARIATRIC ROLLATOR) MISC, 1 Rollator with basket., Disp: 1 each, Rfl: 0   Multiple Vitamin (MULTIVITAMIN) tablet, Take 1 tablet by mouth daily., Disp: , Rfl:    Omega-3 Fatty Acids (FISH OIL) 1200 MG CAPS, Take 1 capsule by mouth daily. , Disp: , Rfl:    ONE TOUCH ULTRA TEST test strip, USE 1 DAILY AS DIRECTED, Disp: 100 each, Rfl: 3   pantoprazole (PROTONIX) 40 MG tablet, Take 1 tablet (40 mg total) by mouth 2 (two) times daily before a meal., Disp: 200 tablet, Rfl: 1   pioglitazone (ACTOS) 15 MG tablet, TAKE 1 TABLET BY MOUTH DAILY, Disp: 100 tablet, Rfl: 2   pramipexole (MIRAPEX) 0.125 MG tablet, Take 1 tablet  (0.125 mg total) by mouth at bedtime., Disp: 30 tablet, Rfl: 2   pregabalin (LYRICA) 75 MG capsule, TAKE 1 CAPSULE BY MOUTH TWICE  DAILY, Disp: 180 capsule, Rfl: 0   tiZANidine (ZANAFLEX) 4 MG tablet, TAKE 1 TABLET BY MOUTH 3 TIMES  DAILY AS NEEDED, Disp: 180 tablet, Rfl: 0   tobramycin (TOBREX) 0.3 % ophthalmic solution, Place 1 drop into both eyes every 6 (six) hours. Day before injection, day of injection and day after injection, Disp: , Rfl:    triamcinolone cream (KENALOG) 0.1 %, Apply 1 Application topically 2 (  two) times daily., Disp: 80 g, Rfl: 0   vortioxetine HBr (TRINTELLIX) 5 MG TABS tablet, Take 10 mg by mouth daily., Disp: , Rfl:   Allergies  Allergen Reactions   Duraprep [Antiseptic Products, Misc.] Itching and Rash    Irritation everywhere prep was used   Advil Allergy Sinus [Chlorpheniramine-Pse-Ibuprofen]     Nervousness    Betadine [Povidone Iodine] Other (See Comments)    Burning sensation.    Hydromorphone Hcl Other (See Comments)    Makes crazy   Morphine And Codeine Other (See Comments)    hallucinations         Objective:  Physical Exam  General: AAO x3, NAD  Dermatological: Full-thickness ulceration was present bone on the hallux and there is a small purulent drainage coming from this area.  There is edema and erythema present to hospital the dorsal aspect of the foot and there is also erythema going up the leg as pictured below.  There is edema present to the left lower extremity which is new for her.  There is no crepitation.         Vascular: Dorsalis Pedis artery and Posterior Tibial artery pedal pulses are 2/4 bilateral with immedate capillary fill time.  There is no pain with calf compression, swelling, warmth, erythema.   Neruologic: Sensation decreased  Musculoskeletal: Digital contractures present.      Assessment:   Osteomyelitis, cellulitis left foot     Plan:  -Treatment options discussed including all alternatives, risks, and  complications -Etiology of symptoms were discussed - X-rays obtained reviewed of the left foot.  3 views were obtained.  There is cortical changes of the distal phalanx consistent with osteomyelitis. - Given the infection, cellulitis of the left lower extremity it is recommended the patient go to emergency room for further evaluation and admission to hospital.  Upon admission we will see the patient.  Recommend MRI, IV antibiotics.  I discussed with the patient likely amputation of the big toe. -We contacted the [facility] ER to let them know of her arrival.  -Surgical shoe dispensed for offloading to help facilitate healing long-term.  Vivi Barrack DPM

## 2023-05-31 ENCOUNTER — Inpatient Hospital Stay (HOSPITAL_COMMUNITY)

## 2023-05-31 ENCOUNTER — Inpatient Hospital Stay (HOSPITAL_COMMUNITY): Admitting: Anesthesiology

## 2023-05-31 ENCOUNTER — Other Ambulatory Visit: Payer: Self-pay

## 2023-05-31 ENCOUNTER — Encounter (HOSPITAL_COMMUNITY): Payer: Self-pay | Admitting: Internal Medicine

## 2023-05-31 ENCOUNTER — Encounter (HOSPITAL_COMMUNITY)
Admission: EM | Disposition: A | Discharge status: Home Health | Payer: Self-pay | Source: Ambulatory Visit | Attending: Internal Medicine

## 2023-05-31 DIAGNOSIS — M869 Osteomyelitis, unspecified: Secondary | ICD-10-CM

## 2023-05-31 HISTORY — PX: AMPUTATION TOE: SHX6595

## 2023-05-31 LAB — SURGICAL PCR SCREEN
MRSA, PCR: NEGATIVE
Staphylococcus aureus: NEGATIVE

## 2023-05-31 LAB — C-REACTIVE PROTEIN: CRP: 10 mg/dL — ABNORMAL HIGH (ref <1.0)

## 2023-05-31 LAB — BASIC METABOLIC PANEL WITH GFR
Anion gap: 14 (ref 5–15)
BUN: 33 mg/dL — ABNORMAL HIGH (ref 8–23)
CO2: 24 mmol/L (ref 22–32)
Calcium: 9.6 mg/dL (ref 8.9–10.3)
Chloride: 100 mmol/L (ref 98–111)
Creatinine, Ser: 1.33 mg/dL — ABNORMAL HIGH (ref 0.44–1.00)
GFR, Estimated: 41 mL/min — ABNORMAL LOW (ref >60)
Glucose, Bld: 98 mg/dL (ref 70–99)
Potassium: 5.1 mmol/L (ref 3.5–5.1)
Sodium: 138 mmol/L (ref 135–145)

## 2023-05-31 LAB — GLUCOSE, CAPILLARY
Glucose-Capillary: 84 mg/dL (ref 70–99)
Glucose-Capillary: 97 mg/dL (ref 70–99)
Glucose-Capillary: 126 mg/dL — ABNORMAL HIGH (ref 70–99)
Glucose-Capillary: 150 mg/dL — ABNORMAL HIGH (ref 70–99)

## 2023-05-31 LAB — CBC
HCT: 33.3 % — ABNORMAL LOW (ref 36.0–46.0)
Hemoglobin: 10.7 g/dL — ABNORMAL LOW (ref 12.0–15.0)
MCH: 27.6 pg (ref 26.0–34.0)
MCHC: 32.1 g/dL (ref 30.0–36.0)
MCV: 86 fL (ref 80.0–100.0)
Platelets: 252 10*3/uL (ref 150–400)
RBC: 3.87 MIL/uL (ref 3.87–5.11)
RDW: 12.7 % (ref 11.5–15.5)
WBC: 7.7 10*3/uL (ref 4.0–10.5)
nRBC: 0 % (ref 0.0–0.2)

## 2023-05-31 LAB — PHOSPHORUS: Phosphorus: 3.9 mg/dL (ref 2.5–4.6)

## 2023-05-31 LAB — MAGNESIUM: Magnesium: 1.7 mg/dL (ref 1.7–2.4)

## 2023-05-31 LAB — SEDIMENTATION RATE: Sed Rate: 55 mm/h — ABNORMAL HIGH (ref 0–22)

## 2023-05-31 SURGERY — AMPUTATION, TOE
Anesthesia: Monitor Anesthesia Care | Site: Toe | Laterality: Left

## 2023-05-31 MED ORDER — AMISULPRIDE (ANTIEMETIC) 5 MG/2ML IV SOLN: 10.0000 mg | Freq: Once | INTRAVENOUS | Status: DC | PRN

## 2023-05-31 MED ORDER — FENTANYL CITRATE (PF) 100 MCG/2ML IJ SOLN
INTRAMUSCULAR | Status: AC
  Administered 2023-05-31: 50 ug via INTRAVENOUS
  Filled 2023-05-31: qty 2

## 2023-05-31 MED ORDER — SODIUM CHLORIDE 0.9 % IR SOLN
Status: DC | PRN
  Administered 2023-05-31: 1000 mL

## 2023-05-31 MED ORDER — LORAZEPAM 2 MG/ML IJ SOLN
0.5000 mg | Freq: Once | INTRAMUSCULAR | Status: AC
  Administered 2023-05-31: 0.5 mg via INTRAVENOUS
  Filled 2023-05-31: qty 1

## 2023-05-31 MED ORDER — TIZANIDINE HCL 2 MG PO TABS
4.0000 mg | ORAL_TABLET | Freq: Three times a day (TID) | ORAL | Status: DC | PRN
  Administered 2023-05-31 – 2023-06-01 (×2): 4 mg via ORAL
  Filled 2023-05-31 (×2): qty 2

## 2023-05-31 MED ORDER — MIDAZOLAM HCL 2 MG/2ML IJ SOLN
INTRAMUSCULAR | Status: AC
  Filled 2023-05-31: qty 2

## 2023-05-31 MED ORDER — BUPIVACAINE HCL (PF) 0.5 % IJ SOLN
INTRAMUSCULAR | Status: AC
  Filled 2023-05-31: qty 30

## 2023-05-31 MED ORDER — CHLORHEXIDINE GLUCONATE 0.12 % MT SOLN: 15.0000 mL | Freq: Once | OROMUCOSAL | Status: AC

## 2023-05-31 MED ORDER — LIDOCAINE HCL (PF) 1 % IJ SOLN
INTRAMUSCULAR | Status: AC
  Filled 2023-05-31: qty 30

## 2023-05-31 MED ORDER — LACTATED RINGERS IV SOLN: INTRAVENOUS | Status: DC

## 2023-05-31 MED ORDER — PREGABALIN 75 MG PO CAPS
75.0000 mg | ORAL_CAPSULE | Freq: Two times a day (BID) | ORAL | Status: DC
  Administered 2023-05-31 – 2023-06-01 (×2): 75 mg via ORAL
  Filled 2023-05-31 (×2): qty 1

## 2023-05-31 MED ORDER — VORTIOXETINE HBR 5 MG PO TABS: 10.0000 mg | ORAL_TABLET | Freq: Every day | ORAL | Status: DC

## 2023-05-31 MED ORDER — FENTANYL CITRATE (PF) 100 MCG/2ML IJ SOLN: 25.0000 ug | INTRAMUSCULAR | Status: DC | PRN

## 2023-05-31 MED ORDER — LIDOCAINE HCL (PF) 1 % IJ SOLN
INTRAMUSCULAR | Status: DC | PRN
  Administered 2023-05-31: 10 mL

## 2023-05-31 MED ORDER — ALPRAZOLAM 0.5 MG PO TABS
0.5000 mg | ORAL_TABLET | Freq: Three times a day (TID) | ORAL | Status: DC | PRN
  Administered 2023-05-31 – 2023-06-01 (×2): 0.5 mg via ORAL
  Filled 2023-05-31 (×2): qty 1

## 2023-05-31 MED ORDER — VANCOMYCIN HCL 500 MG IV SOLR
INTRAVENOUS | Status: AC
  Filled 2023-05-31: qty 10

## 2023-05-31 MED ORDER — ATORVASTATIN CALCIUM 40 MG PO TABS
40.0000 mg | ORAL_TABLET | Freq: Every day | ORAL | Status: DC
  Administered 2023-06-01: 40 mg via ORAL
  Filled 2023-05-31: qty 1

## 2023-05-31 MED ORDER — BUPIVACAINE HCL (PF) 0.5 % IJ SOLN
INTRAMUSCULAR | Status: DC | PRN
  Administered 2023-05-31: 10 mL

## 2023-05-31 MED ORDER — CHLORHEXIDINE GLUCONATE 0.12 % MT SOLN
OROMUCOSAL | Status: AC
  Administered 2023-05-31: 15 mL via OROMUCOSAL
  Filled 2023-05-31: qty 15

## 2023-05-31 MED ORDER — ORAL CARE MOUTH RINSE: 15.0000 mL | Freq: Once | OROMUCOSAL | Status: AC

## 2023-05-31 MED ORDER — LEVOTHYROXINE SODIUM 100 MCG PO TABS
125.0000 ug | ORAL_TABLET | Freq: Every day | ORAL | Status: DC
  Administered 2023-05-31 – 2023-06-01 (×2): 125 ug via ORAL
  Filled 2023-05-31 (×2): qty 1

## 2023-05-31 MED ORDER — TOBRAMYCIN SULFATE 80 MG/2ML IJ SOLN
INTRAMUSCULAR | Status: AC
  Filled 2023-05-31: qty 2

## 2023-05-31 MED ORDER — LACTATED RINGERS IV SOLN: INTRAVENOUS | Status: DC | PRN

## 2023-05-31 MED ORDER — PANTOPRAZOLE SODIUM 40 MG PO TBEC
40.0000 mg | DELAYED_RELEASE_TABLET | Freq: Two times a day (BID) | ORAL | Status: DC
  Administered 2023-05-31 – 2023-06-01 (×2): 40 mg via ORAL
  Filled 2023-05-31 (×2): qty 1

## 2023-05-31 MED ORDER — ATENOLOL 25 MG PO TABS
25.0000 mg | ORAL_TABLET | Freq: Every day | ORAL | Status: DC
  Administered 2023-06-01: 25 mg via ORAL
  Filled 2023-05-31: qty 1

## 2023-05-31 MED ORDER — PRAMIPEXOLE DIHYDROCHLORIDE 0.25 MG PO TABS
0.1250 mg | ORAL_TABLET | Freq: Every day | ORAL | Status: DC
  Administered 2023-05-31: 0.125 mg via ORAL
  Filled 2023-05-31 (×2): qty 1

## 2023-05-31 MED ORDER — PROPOFOL 500 MG/50ML IV EMUL
INTRAVENOUS | Status: DC | PRN
  Administered 2023-05-31: 100 ug/kg/min via INTRAVENOUS

## 2023-05-31 MED ORDER — MIDAZOLAM HCL 5 MG/5ML IJ SOLN
INTRAMUSCULAR | Status: DC | PRN
  Administered 2023-05-31: 2 mg via INTRAVENOUS

## 2023-05-31 SURGICAL SUPPLY — 39 items
BLADE AVERAGE 25X9 (BLADE) IMPLANT
BLADE SURG 10 STRL SS (BLADE) ×1 IMPLANT
BLADE SURG 15 STRL LF DISP TIS (BLADE) ×1 IMPLANT
BNDG COHESIVE 3X5 TAN ST LF (GAUZE/BANDAGES/DRESSINGS) ×1 IMPLANT
BNDG ELASTIC 3INX 5YD STR LF (GAUZE/BANDAGES/DRESSINGS) ×1 IMPLANT
BNDG ELASTIC 4INX 5YD STR LF (GAUZE/BANDAGES/DRESSINGS) IMPLANT
BNDG ESMARK 4X9 LF (GAUZE/BANDAGES/DRESSINGS) ×1 IMPLANT
BNDG GAUZE DERMACEA FLUFF 4 (GAUZE/BANDAGES/DRESSINGS) IMPLANT
CHLORAPREP W/TINT 26 (MISCELLANEOUS) IMPLANT
DRSG ADAPTIC 3X8 NADH LF (GAUZE/BANDAGES/DRESSINGS) IMPLANT
DRSG XEROFORM 1X8 (GAUZE/BANDAGES/DRESSINGS) IMPLANT
ELECT REM PT RETURN 9FT ADLT (ELECTROSURGICAL) ×1 IMPLANT
ELECTRODE REM PT RTRN 9FT ADLT (ELECTROSURGICAL) ×1 IMPLANT
GAUZE PAD ABD 8X10 STRL (GAUZE/BANDAGES/DRESSINGS) IMPLANT
GAUZE SPONGE 2X2 STRL 8-PLY (GAUZE/BANDAGES/DRESSINGS) IMPLANT
GAUZE SPONGE 4X4 12PLY STRL (GAUZE/BANDAGES/DRESSINGS) ×1 IMPLANT
GAUZE STRETCH 2X75IN STRL (MISCELLANEOUS) ×1 IMPLANT
GAUZE XEROFORM 1X8 LF (GAUZE/BANDAGES/DRESSINGS) ×1 IMPLANT
GLOVE BIO SURGEON STRL SZ7.5 (GLOVE) ×1 IMPLANT
GLOVE BIOGEL PI IND STRL 7.5 (GLOVE) ×1 IMPLANT
GOWN STRL REUS W/ TWL LRG LVL3 (GOWN DISPOSABLE) ×2 IMPLANT
KIT BASIN OR (CUSTOM PROCEDURE TRAY) ×1 IMPLANT
NDL BIOPSY JAMSHIDI 8X6 (NEEDLE) IMPLANT
NDL HYPO 25X1 1.5 SAFETY (NEEDLE) ×1 IMPLANT
NEEDLE BIOPSY JAMSHIDI 8X6 (NEEDLE) IMPLANT
NEEDLE HYPO 25X1 1.5 SAFETY (NEEDLE) ×1 IMPLANT
PACK ORTHO EXTREMITY (CUSTOM PROCEDURE TRAY) ×1 IMPLANT
PADDING CAST ABS COTTON 4X4 ST (CAST SUPPLIES) ×2 IMPLANT
SET HNDPC FAN SPRY TIP SCT (DISPOSABLE) IMPLANT
SPIKE FLUID TRANSFER (MISCELLANEOUS) IMPLANT
STOCKINETTE 4X48 STRL (DRAPES) IMPLANT
SUT ETHILON 3 0 FSLX (SUTURE) IMPLANT
SUT PROLENE 3 0 PS 2 (SUTURE) IMPLANT
SUT PROLENE 4 0 PS 2 18 (SUTURE) IMPLANT
SYR CONTROL 10ML LL (SYRINGE) ×1 IMPLANT
TUBE CONNECTING 12X1/4 (SUCTIONS) IMPLANT
UNDERPAD 30X36 HEAVY ABSORB (UNDERPADS AND DIAPERS) ×1 IMPLANT
WATER STERILE IRR 1000ML POUR (IV SOLUTION) ×1 IMPLANT
YANKAUER SUCT BULB TIP NO VENT (SUCTIONS) IMPLANT

## 2023-05-31 NOTE — Progress Notes (Addendum)
 Pt HR up to 120's initially. Other VSS Pt denied chest patient.  Seemed anxious.  MD notified.    Pt HR up to 140's later. MD also notified.  EKG done

## 2023-05-31 NOTE — OR Nursing (Signed)
 DR Annamary Rummage STATED TO PREP PATIENT WITH BETADINE AND TO RINSE OFF POST PROCEDURE

## 2023-05-31 NOTE — Anesthesia Postprocedure Evaluation (Signed)
 Anesthesia Post Note  Patient: Katie Booker  Procedure(s) Performed: LEFT GREAT TOE AMPUTATION (Left: Toe)     Patient location during evaluation: PACU Anesthesia Type: MAC Level of consciousness: awake and alert Pain management: pain level controlled Vital Signs Assessment: post-procedure vital signs reviewed and stable Respiratory status: spontaneous breathing, nonlabored ventilation and respiratory function stable Cardiovascular status: stable and blood pressure returned to baseline Postop Assessment: no apparent nausea or vomiting Anesthetic complications: no  No notable events documented.  Last Vitals:  Vitals:   05/31/23 1145 05/31/23 1215  BP: 139/60 137/73  Pulse: 85 (!) 101  Resp: 16 18  Temp:    SpO2: 98% 100%    Last Pain:  Vitals:   05/31/23 1217  TempSrc:   PainSc: 3                  Alegra Rost,W. EDMOND

## 2023-05-31 NOTE — Progress Notes (Signed)
 Pt transported to surgery via bed by transportation staff

## 2023-05-31 NOTE — Progress Notes (Signed)
 [facility] Progress Note Patient: Katie Booker JXB:[id number] DOB: 01-12-1946 DOA: 05/30/2023  DOS: the patient was seen and examined on 05/31/2023  Brief Hospital Course: PMH of type II DM, neuropathy, HLD, HTN, restless leg syndrome, hypothyroidism, obesity, chronic anxiety presented to the hospital with complaints of left foot ulcer with osteomyelitis. Podiatry was consulted and underwent amputation on 4/8.  Assessment and Plan: Left foot diabetic ulcer with osteomyelitis. Currently on IV antibiotic. Underwent amputation on 4/8. Podiatry feels clear margin. Patient will be switched to oral antibiotic. WBAT recommended. PT OT consulted. Monitor.  Sinus tachycardia. Resume home medication as the patient did not receive her Xanax. Monitor.  AKI on CKD 3B. Baseline serum creatinine 1.1.  Mild worsening on admission 1.5.  Monitor with fluids.  Hypothyroidism Continue Synthroid about  HLD. Lipid hypertension Restless leg syndrome.   Valsartan  Chronic pain On Norco and Lyrica.  Continue Lyrica, Norco switch to oxy.  Chronic anxiety. On Xanax.  Resume.  Chronic muscle spasm from On Zanaflex.      Subjective: Pain well-controlled.  No nausea no vomiting no fever no chills.  Physical Exam: General: in Mild distress, No Rash Cardiovascular: S1 and S2 Present, No Murmur Respiratory: Good respiratory effort, Bilateral Air entry present. No Crackles, No wheezes Abdomen: Bowel Sound present, No tenderness Extremities: No edema, left leg is wrapped. Neuro: Alert and oriented x3, no new focal deficit  Data Reviewed: I have Reviewed nursing notes, Vitals, and Lab results. Since last encounter, pertinent lab results CBC and CMP   . I have ordered test including CBC and CMP  .   Disposition: Status is: Inpatient Remains inpatient appropriate because: Monitor for improvement in pain control and postop recovery  Place and maintain sequential compression device  Start: 05/31/23 0547   Family Communication: No one at bedside Level of care: Telemetry Surgical   Vitals:   05/31/23 1130 05/31/23 1145 05/31/23 1215 05/31/23 1624  BP: 105/86 139/60 137/73 (!) 149/83  Pulse: 88 85 (!) 101 99  Resp: 16 16 18 18   Temp:    98.1 F (36.7 C)  TempSrc:    Oral  SpO2: 95% 98% 100% 100%  Weight:      Height:         Author: Lynden Oxford, MD 05/31/2023 7:40 PM  Please look on [email or web address] to find out who is on call.

## 2023-05-31 NOTE — Consult Note (Signed)
 PODIATRY CONSULTATION  NAME Katie Booker MRN [id number] DOB March 24, 1945 DOA 05/30/2023   Reason for consult:  Chief Complaint  Patient presents with   Wound Infection    Attending/Consulting physician: Leodis Binet MD  History of present illness: "Katie Booker is a 78 y.o. female with medical history significant for type 2 diabetes, diabetic polyneuropathy, hyperlipidemia, hypertension, restless leg syndrome, iron deficiency anemia, hypothyroidism, obesity, who presents to the ER sent by her podiatrist due to concerns for left great toe diabetic ulcer with infection.  The patient endorses noticing skin peeling off her left great Toe about a week ago.  2 days ago she noticed a hole at the bottom part of her left big toe.  Denies subjective fevers or chills.  She presented to the ER for further evaluation.   In the ER, hemodynamically stable.  X-ray of the left foot revealed cortical changes of the distal phalanx consistent with osteomyelitis.  Lab studies notable for elevated potassium 5.4, BUN 41, creatinine 1.58 from baseline of 1.12, GFR 34.  Received IV fluid.  Podiatry recommended MRI and IV antibiotics."  Pt seen in pre op discussed plan for OR today for Left hallux osteomyelitis. She is sad about the need for amp but understands and agrees to proceed.   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   Depression    DOE (dyspnea on exertion)    GERD    Headache    Hemorrhoids    History of basal cell carcinoma excision    left cheek   History of colon polyps    History of hyperthyroidism 2011   RAI treatement   History of panic attacks    Hyperlipidemia    Hypertension    Hypothyroidism, postradioiodine therapy     followed by pcp   IDA (iron deficiency anemia)    intermittant   Insomnia    OA (osteoarthritis)    OSA (obstructive sleep apnea)    no cpap, per pt tried unable to tolerate   Peripheral neuropathy    legs and feet from hx back surgery's   Rash    bilateral axilla area -- per pt due to some personal wipes used other the counter   RLS (restless legs syndrome)    Spondylolisthesis at L1-L2 level    Tingling of both upper extremities    left greater than right due to cervical pinched nerve   Type 2 diabetes mellitus (HCC)    followed by pcp   Umbilical hernia    Urinary frequency    Urinary urgency        Latest Ref Rng & Units 05/31/2023    6:37 AM 05/30/2023    5:18 PM 04/18/2023   10:48 AM  CBC  WBC 4.0 - 10.5 K/uL 7.7  8.5  6.7   Hemoglobin 12.0 - 15.0 g/dL 40.9  81.1  91.4   Hematocrit 36.0 - 46.0 % 33.3  34.0  37.3   Platelets 150 - 400 K/uL 252  232  217.0        Latest Ref Rng & Units 05/31/2023    6:37 AM 05/30/2023    5:18 PM  04/18/2023   10:48 AM  BMP  Glucose 70 - 99 mg/dL 98  88  83   BUN 8 - 23 mg/dL 33  41  26   Creatinine 0.44 - 1.00 mg/dL 9.52  8.41  3.24   Sodium 135 - 145 mmol/L 138  138  134   Potassium 3.5 - 5.1 mmol/L 5.1  5.4  4.9   Chloride 98 - 111 mmol/L 100  99  97   CO2 22 - 32 mmol/L 24  27  27    Calcium 8.9 - 10.3 mg/dL 9.6  9.4  9.4       Physical Exam: Lower Extremity Exam Palpable DP and PT pulse Left hallux erythema and edmea Erythema extending up leg Drainage and malodor from wound    ASSESSMENT/PLAN OF CARE 78 y.o. female with PMHx significant for   type 2 diabetes, diabetic polyneuropathy, hyperlipidemia, hypertension, restless leg syndrome, iron deficiency anemia, hypothyroidism, obesity  with Left hallux osteomyelitis.    - NPO for OR today for Left hallux amputation. She agrees to proceed. - Continue IV abx broad spectrum pending further culture data - Anticoagulation: Hold pendign OR - Wound care: None pre op - WB  status: WBAT LLE - Will continue to follow   Thank you for the consult.  Please contact me directly with any questions or concerns.           Corinna Gab, DPM [facility] / [name]    [address] [place], [place] [address]                Office [phone]  Fax [phone]

## 2023-05-31 NOTE — Transfer of Care (Signed)
 Immediate Anesthesia Transfer of Care Note  Patient: Katie Booker  Procedure(s) Performed: LEFT GREAT TOE AMPUTATION (Left: Toe)  Patient Location: PACU  Anesthesia Type:MAC  Level of Consciousness: awake, alert , oriented, and patient cooperative  Airway & Oxygen Therapy: Patient Spontanous Breathing  Post-op Assessment: Report given to RN, Post -op Vital signs reviewed and stable, and Patient moving all extremities  Post vital signs: Reviewed and stable  Last Vitals:  Vitals Value Taken Time  BP 111/80 05/31/23 1034  Temp    Pulse 84 05/31/23 1037  Resp 18 05/31/23 1035  SpO2 98 % 05/31/23 1037  Vitals shown include unfiled device data.  Last Pain:  Vitals:   05/31/23 0928  TempSrc:   PainSc: 3       Patients Stated Pain Goal: 2 (05/31/23 1610)  Complications: No notable events documented.

## 2023-05-31 NOTE — Progress Notes (Signed)
 Orthopedic Tech Progress Note Patient Details:  Katie Booker 10-Jul-1945 [id number]  Patient has POST OP SHOE, patient was given a shoe in the ED yesterday   Patient ID: [name], female   DOB: 04/20/1945, 78 y.o.   MRN: [id number]  Donald Pore 05/31/2023, 12:51 PM

## 2023-05-31 NOTE — Progress Notes (Signed)
 Pt off unit for MRI. Transport for surgery stated he would come back when pt is back on floor

## 2023-05-31 NOTE — Progress Notes (Deleted)
 EKG done

## 2023-05-31 NOTE — Progress Notes (Signed)
 Attempted to get report on patient for surgery. Floor unable to find nurse. Transport to be sent for patient.

## 2023-05-31 NOTE — Anesthesia Preprocedure Evaluation (Addendum)
 Anesthesia Evaluation  Patient identified by MRN, date of birth, ID band Patient awake    Reviewed: Allergy & Precautions, NPO status , Patient's Chart, lab work & pertinent test results  Airway Mallampati: II  TM Distance: >3 FB Neck ROM: Full    Dental  (+) Dental Advisory Given, Poor Dentition   Pulmonary sleep apnea , former smoker   breath sounds clear to auscultation       Cardiovascular hypertension, Pt. on medications and Pt. on home beta blockers + DOE   Rhythm:Regular Rate:Normal     Neuro/Psych  Headaches  Anxiety Depression     Neuromuscular disease    GI/Hepatic Neg liver ROS,GERD  ,,  Endo/Other  diabetes, Type 2Hypothyroidism    Renal/GU CRFRenal disease     Musculoskeletal  (+) Arthritis ,    Abdominal   Peds  Hematology  (+) Blood dyscrasia, anemia   Anesthesia Other Findings   Reproductive/Obstetrics                             Anesthesia Physical Anesthesia Plan  ASA: 3  Anesthesia Plan: MAC   Post-op Pain Management: Minimal or no pain anticipated   Induction:   PONV Risk Score and Plan: 2 and Propofol infusion, Ondansetron and Treatment may vary due to age or medical condition  Airway Management Planned: Natural Airway and Simple Face Mask  Additional Equipment:   Intra-op Plan:   Post-operative Plan:   Informed Consent: I have reviewed the patients History and Physical, chart, labs and discussed the procedure including the risks, benefits and alternatives for the proposed anesthesia with the patient or authorized representative who has indicated his/her understanding and acceptance.     Dental advisory given  Plan Discussed with: CRNA  Anesthesia Plan Comments:        Anesthesia Quick Evaluation

## 2023-05-31 NOTE — Op Note (Signed)
 Full Operative Report  Date of Operation: 9:52 AM, 05/31/2023   Patient: Katie Booker - 78 y.o. female  Surgeon: Pilar Plate, DPM   Assistant: None  Diagnosis: osteomyelitis left great toe  Procedure:  1. Amputation of great toe at MPJ level, left foot    Anesthesia: Monitor Anesthesia Care  Gaynelle Adu, MD  Anesthesiologist: Gaynelle Adu, MD   Estimated Blood Loss: Minimal   Hemostasis: 1) Anatomical dissection, mechanical compression, electrocautery 2) No tourniquet was used during the procedure  Implants: * No implants in log *  Materials: Prolene 3-0  Injectables: 1) Pre-operatively: 10 cc of 50:50 mixture 1%lidocaine plain and 0.5% marcaine plain 2) Post-operatively: 10 cc of 50:50 mixture 1%lidocaine plain and 0.5% marcaine plain  Specimens: Pathology: Left great toe for path  Microbiology: Left distal phalanx bone for micro   Antibiotics: IV antibiotics given per schedule on the floor  Drains: None  Complications: Patient tolerated the procedure well without complication.   Operative findings: As below in detailed report  Indications for Procedure: Katie Booker presents to Pilar Plate, DPM with a chief complaint of draining ulceration and infection of the left hallux. Concern for osteomyelitis of the L hallux. Abscess plantar aspect of the toe. The patient has failed conservative treatments of various modalities. At this time the patient has elected to proceed with surgical correction. All alternatives, risks, and complications of the procedures were thoroughly explained to the patient. Patient exhibits appropriate understanding of all discussion points and informed consent was signed and obtained in the chart with no guarantees to surgical outcome given or implied.  Description of Procedure: Patient was brought to the operating room. Patient remained on their hospital bed in the supine position. A surgical timeout  was performed and all members of the operating room, the procedure, and the surgical site were identified. anesthesia occurred as per anesthesia record. Local anesthetic as previously described was then injected about the operative field in a local infiltrative block.  The operative lower extremity as noted above was then prepped and draped in the usual sterile manner. The following procedure then began.  Attention was directed to the hallux digit on the LEFT foot. There was noted to be an abscess at the proximal base of the toe. A full-thickness incision encompassing the entire digit was made using a #15 blade. Dissection was carried down to bone. The toe was secured with a towel clamp, further dissected in its entirety, and disarticulated at the hallux MPJ and passed to the back table as a gross specimen. A bone culture was harvested from the Left distal phalanx and sent for micro.The toe was then labled and sent to pathology. The bone was noted to be soft and eroded, and consistent with osteomyelitis. All remaining necrotic and devitalized soft tissue structures were visualized and dissected away using sharp and dull dissection.   Care was taken to protect all neurovascular structures throughout the dissection. All bleeders were cauterized as necessary.The area was then flushed with copious amounts of sterile saline. Then using the suture materials previously described, the site was closed in anatomic layers and the skin was well approximated under minimal tension.  The surgical site was then dressed with xerform 4x4 kerlix and ace wrap. The patient tolerated both the procedure and anesthesia well with vital signs stable throughout. The patient was transferred in good condition and all vital signs stable  from the OR to recovery under the discretion of anesthesia.  Condition: Vital signs stable,  neurovascular status unchanged from preoperative   Surgical plan:  Toe required amp at MPJ level given plantar  hallux abscess. Believe clean margin has been obtained. 7 days Augmentin recommended on DC. WBAT in post op shoe.    The patient will be WBAT in a post op shoe to the operative limb until further instructed. The dressing is to remain clean, dry, and intact. Will continue to follow unless noted elsewhere.   Carlena Hurl, DPM [facility]

## 2023-05-31 NOTE — Progress Notes (Signed)
 Pt arrived back to 6 north room 18 alert and oriented x4. Pain level 3/10. Left foot wrapped in acewrap clean dry and intact. Pt hooked to purewick and voided on arrival ( urine clear, yellow). Bed in lowest position. Call light in reach. Lunch tray ordered. All needs met at this time.

## 2023-06-01 ENCOUNTER — Other Ambulatory Visit (HOSPITAL_COMMUNITY): Payer: Self-pay

## 2023-06-01 ENCOUNTER — Encounter (HOSPITAL_COMMUNITY): Payer: Self-pay | Admitting: Podiatry

## 2023-06-01 DIAGNOSIS — E13621 Other specified diabetes mellitus with foot ulcer: Secondary | ICD-10-CM | POA: Diagnosis not present

## 2023-06-01 DIAGNOSIS — L97509 Non-pressure chronic ulcer of other part of unspecified foot with unspecified severity: Secondary | ICD-10-CM | POA: Diagnosis not present

## 2023-06-01 DIAGNOSIS — M869 Osteomyelitis, unspecified: Secondary | ICD-10-CM | POA: Diagnosis not present

## 2023-06-01 LAB — CBC
HCT: 29.5 % — ABNORMAL LOW (ref 36.0–46.0)
Hemoglobin: 9.7 g/dL — ABNORMAL LOW (ref 12.0–15.0)
MCH: 27.5 pg (ref 26.0–34.0)
MCHC: 32.9 g/dL (ref 30.0–36.0)
MCV: 83.6 fL (ref 80.0–100.0)
Platelets: 260 10*3/uL (ref 150–400)
RBC: 3.53 MIL/uL — ABNORMAL LOW (ref 3.87–5.11)
RDW: 12.9 % (ref 11.5–15.5)
WBC: 7.3 10*3/uL (ref 4.0–10.5)
nRBC: 0 % (ref 0.0–0.2)

## 2023-06-01 LAB — BASIC METABOLIC PANEL WITH GFR
Anion gap: 11 (ref 5–15)
BUN: 21 mg/dL (ref 8–23)
CO2: 26 mmol/L (ref 22–32)
Calcium: 8.9 mg/dL (ref 8.9–10.3)
Chloride: 101 mmol/L (ref 98–111)
Creatinine, Ser: 1.07 mg/dL — ABNORMAL HIGH (ref 0.44–1.00)
GFR, Estimated: 53 mL/min — ABNORMAL LOW (ref >60)
Glucose, Bld: 148 mg/dL — ABNORMAL HIGH (ref 70–99)
Potassium: 3.8 mmol/L (ref 3.5–5.1)
Sodium: 138 mmol/L (ref 135–145)

## 2023-06-01 LAB — MAGNESIUM: Magnesium: 1.5 mg/dL — ABNORMAL LOW (ref 1.7–2.4)

## 2023-06-01 LAB — SURGICAL PATHOLOGY

## 2023-06-01 MED ORDER — HYDROCODONE-ACETAMINOPHEN 7.5-325 MG PO TABS
1.0000 | ORAL_TABLET | Freq: Four times a day (QID) | ORAL | Status: DC | PRN
Start: 2023-06-01 — End: 2023-07-20

## 2023-06-01 MED ORDER — AMOXICILLIN-POT CLAVULANATE 875-125 MG PO TABS: 1.0000 | ORAL_TABLET | Freq: Two times a day (BID) | ORAL | 0 refills | Status: DC

## 2023-06-01 MED ORDER — OXYCODONE-ACETAMINOPHEN 7.5-325 MG PO TABS: 1.0000 | ORAL_TABLET | ORAL | 0 refills | Status: DC | PRN

## 2023-06-01 NOTE — Discharge Summary (Signed)
 Physician Discharge Summary  Katie Booker GEX:[id number] DOB: 1945/05/06 DOA: 05/30/2023  PCP: Philip Aspen, Limmie Patricia, MD  Admit date: 05/30/2023 Discharge date: 06/01/2023  Time spent: 27 minutes  Recommendations for Outpatient Follow-up:  Requires postop check with podiatry in the outpatient setting within a week-CC Dr. Loreta Ave Get Chem-7 CBC 2 to 3 weeks in the outpatient setting Complete antibiotics  Discharge Diagnoses:  MAIN problem for hospitalization   Osteomyelitis left great toe status post amputation Diabetes mellitus  Please see below for itemized issues addressed in HOpsital- refer to other progress notes for clarity if needed  Discharge Condition: Improved  Diet recommendation: Diabetic heart healthy  Filed Weights   05/31/23 0850  Weight: 89.4 kg    History of present illness:  78 year old white female Known arthritis of left knee status post surgery 2019 Spondylolisthesis L1-L2 status post surgery 2017 Dr. 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-uses pessary DM 2 x 2 HTN HLD history  Seen in podiatry office 4/7 with left foot big toe ingrown with left foot ulcer X-rays of the foot reveals osteomyelitis patient was requested to go to the emergency room-podiatry saw the patient and did amputation of great toe at the MPJ level of the left foot with clean and stable margins and she was recommended 7 days of Augmentin at DC and weightbearing as tolerated in postop shoe and would be followed in the outpatient setting by podiatrist Postop she had some sinus tachycardia  She was ambulatory subsequent to the procedure pain was moderately well-controlled as she does have some insensate feet Her glycemic control was very good during the hospital stay-- she was resumed on her usual oral metformin pioglitazone etc. She had some AKI on admission that improved with IV fluids She should follow-up in the outpatient setting for  outpatient labs We resume her usual Xanax and Zanaflex-we switched her Norco to oxycodone Limited prescription as she will need pain control postdischarge She should get a TSH in about a month She stabilized from hospital stay and should be able to go home-CC Dr. Netta Corrigan postdischarge to ensure they are aware of postop   Discharge Exam: Vitals:   06/01/23 0751 06/01/23 0839  BP: 129/87 129/87  Pulse: 92 92  Resp: 16   Temp: 98.6 F (37 C)   SpO2: 100%     Subj on day ofd/c    awake coherent pleasant no distress a little anxious  General Exam on discharge   EOMI NCAT thick neck Mallampati 4 CTAB no added sound no wheeze no rales no rhonchi S1-S2 no murmur Abdomen soft no rebound no guarding ROM intact Left leg is in weightbearing shoe/postop shoe I did not exam wound No lower extremity  Discharge Instructions    Allergies as of 06/01/2023       Reactions   Duraprep [antiseptic Products, Misc.] Itching, Rash   Irritation everywhere prep was used   Advil Allergy Sinus [chlorpheniramine-pse-ibuprofen]    Nervousness    Betadine [povidone Iodine] Other (See Comments)   Burning sensation.    Hydromorphone Hcl Other (See Comments)   Makes crazy   Morphine And Codeine Other (See Comments)   hallucinations        Medication List     STOP taking these medications    HYDROcodone-acetaminophen 7.5-325 MG tablet Commonly known as: NORCO   lisinopril 10 MG tablet Commonly known as: ZESTRIL       TAKE these medications    ALPRAZolam 0.5 MG  tablet Commonly known as: XANAX Take 1 tablet (0.5 mg total) by mouth 2 (two) times daily.   amoxicillin-clavulanate 875-125 MG tablet Commonly known as: AUGMENTIN Take 1 tablet by mouth 2 (two) times daily.   atenolol 25 MG tablet Commonly known as: TENORMIN TAKE 1 TABLET BY MOUTH DAILY   atorvastatin 40 MG tablet Commonly known as: LIPITOR TAKE 1 TABLET BY MOUTH DAILY   calcium citrate 950 (200 Ca) MG  tablet Commonly known as: CALCITRATE - dosed in mg elemental calcium Take 200 mg of elemental calcium by mouth daily.   estradiol 0.1 MG/GM vaginal cream Commonly known as: ESTRACE Place 0.5 g vaginally 2 (two) times a week. Place 0.5g nightly for two weeks then twice a week after   Fish Oil 1200 MG Caps Take 1 capsule by mouth daily.   GENTEAL OP Apply to eye.   hydrocortisone 2.5 % cream APPLY TO AFFECTED AREA(S)  TOPICALLY TWICE DAILY   levothyroxine 125 MCG tablet Commonly known as: SYNTHROID TAKE 1 TABLET BY MOUTH DAILY   LUBRICANT EYE DROPS OP Apply 2 drops to eye 4 (four) times daily as needed (dry eyes).   meloxicam 15 MG tablet Commonly known as: MOBIC TAKE 1 TABLET BY MOUTH DAILY   metFORMIN 1000 MG tablet Commonly known as: GLUCOPHAGE TAKE 1 TABLET BY MOUTH TWICE  DAILY WITH MEALS   multivitamin tablet Take 1 tablet by mouth daily.   ONE TOUCH ULTRA TEST test strip Generic drug: glucose blood USE 1 DAILY AS DIRECTED   onetouch ultrasoft lancets Use daily   oxyCODONE-acetaminophen 7.5-325 MG tablet Commonly known as: Percocet Take 1 tablet by mouth every 4 (four) hours as needed for moderate pain (pain score 4-6).   pantoprazole 40 MG tablet Commonly known as: PROTONIX Take 1 tablet (40 mg total) by mouth 2 (two) times daily before a meal.   pioglitazone 15 MG tablet Commonly known as: ACTOS TAKE 1 TABLET BY MOUTH DAILY   pramipexole 0.125 MG tablet Commonly known as: MIRAPEX Take 1 tablet (0.125 mg total) by mouth at bedtime.   pregabalin 75 MG capsule Commonly known as: LYRICA TAKE 1 CAPSULE BY MOUTH TWICE  DAILY   tiZANidine 4 MG tablet Commonly known as: ZANAFLEX TAKE 1 TABLET BY MOUTH 3 TIMES  DAILY AS NEEDED   tobramycin 0.3 % ophthalmic solution Commonly known as: TOBREX Place 1 drop into both eyes every 6 (six) hours. Day before injection, day of injection and day after injection   triamcinolone cream 0.1 % Commonly known as:  KENALOG Apply 1 Application topically 2 (two) times daily.   vortioxetine HBr 5 MG Tabs tablet Commonly known as: TRINTELLIX Take 10 mg by mouth daily.               Discharge Care Instructions  (From admission, onward)           Start     Ordered   06/01/23 0000  Leave dressing on - Keep it clean, dry, and intact until clinic visit        06/01/23 0918           Allergies  Allergen Reactions   Duraprep [Antiseptic Products, Misc.] Itching and Rash    Irritation everywhere prep was used   Advil Allergy Sinus [Chlorpheniramine-Pse-Ibuprofen]     Nervousness    Betadine [Povidone Iodine] Other (See Comments)    Burning sensation.    Hydromorphone Hcl Other (See Comments)    Makes crazy   Morphine And Codeine  Other (See Comments)    hallucinations      The results of significant diagnostics from this hospitalization (including imaging, microbiology, ancillary and laboratory) are listed below for reference.    Significant Diagnostic Studies: DG Foot 2 Views Left Result Date: 05/31/2023 CLINICAL DATA:  Postoperative evaluation. EXAM: LEFT FOOT - 2 VIEW COMPARISON:  Left foot radiographs 05/30/2023 FINDINGS: Interval amputation of the great toe phalanges. The distal cortical margin of the adjacent great toe metatarsal head is intact. No cortical erosion is seen to indicate acute osteomyelitis. Small plantar and posterior calcaneal heel spurs. Mild talonavicular joint space narrowing and dorsal osteophytosis. IMPRESSION: Interval amputation of the great toe phalanges. Expected postoperative change. Electronically Signed   By: Neita Garnet M.D.   On: 05/31/2023 15:05   MR FOOT LEFT WO CONTRAST Result Date: 05/31/2023 CLINICAL DATA:  Foot swelling. Diabetes. Osteomyelitis suspected. Type 2 diabetes, diabetic polyneuropathy, hyperlipidemia, hypertension, restless leg syndrome. Iron deficiency anemia. Sent by podiatrist to ER for concerns of left great toe diabetic ulcer  with infection. Skin was peeling off left great toe about a week ago. 2 days ago noticed ulcer at the plantar left great toe. EXAM: MRI OF THE LEFT FOOT WITHOUT CONTRAST TECHNIQUE: Multiplanar, multisequence MR imaging of the left forefoot was performed. No intravenous contrast was administered. COMPARISON:  Left foot radiographs 05/31/2023 (performed after the current MRI, on same day 05/31/2023), 05/30/2023; MRI left ankle 11/01/2021 FINDINGS: Bones/Joint/Cartilage As seen on prior radiographs, there is mild high-grade erosion and fragmentation of the mid and distal aspect of the distal phalanx of the great toe. Diffuse marrow edema throughout the distal phalanx. There is fluid bright signal interspersed between the distal phalanx bone fragments suspicious for small abscesses, in addition to the proximal plantar lateral great toe soft tissue abscesses described in the "soft tissue" section below. Findings are indicative of acute osteomyelitis of the distal phalanx of the great toe. No definite osteomyelitis of the great toe proximal phalanx. No osteomyelitis is seen elsewhere within the forefoot. Moderate talonavicular and navicular-cuneiform cartilage thinning. Ligaments The Lisfranc ligament complex is intact. There appears to be thinning of the distal phalanx attachment of the great toe interphalangeal lateral collateral ligament secondary to the distal phalanx osteomyelitis. Otherwise, the metatarsophalangeal and interphalangeal collateral ligaments appear intact. Muscles and Tendons There is mild plantar forefoot muscle edema, nonspecific. The flexor and extensor tendons appear intact. Soft tissues There is high-grade great toe soft tissue swelling. There is a small ulcer measuring up to approximately 5 mm within the distal medial aspect of the great toe (coronal series 7, image 16 and sagittal series 8, image 4). There are 3 regions of decreased T1 increased T2 signal likely abscesses within the proximal,  plantar, lateral aspect of the great toe soft tissues suspicious for abscesses measuring up to 11 mm (coronal series 7, image 17) and 10 mm (coronal series 7, image 16), respectively. IMPRESSION: 1. Small ulcer within the distal medial aspect of the great toe with high-grade soft tissue swelling and edema. Marrow edema and erosion/fragmentation of the distal phalanx of the great toe indicative of acute osteomyelitis. 2. There are 3 small abscesses within the proximal, plantar, lateral aspect of the great toe soft tissues measuring up to 11 mm. Additional possible small abscesses in between the great toe distal phalanx osteomyelitis bone fragments. Electronically Signed   By: Neita Garnet M.D.   On: 05/31/2023 13:37   DG Foot Complete Left Result Date: 05/30/2023 Please see detailed radiograph report in  office note.   Microbiology: Recent Results (from the past 240 hours)  Surgical pcr screen     Status: None   Collection Time: 05/31/23  4:50 AM   Specimen: Nasal Mucosa; Nasal Swab  Result Value Ref Range Status   MRSA, PCR NEGATIVE NEGATIVE Final   Staphylococcus aureus NEGATIVE NEGATIVE Final    Comment: (NOTE) The Xpert SA Assay (FDA approved for NASAL specimens in patients 17 years of age and older), is one component of a comprehensive surveillance program. It is not intended to diagnose infection nor to guide or monitor treatment. Performed at [facility], [address] [place], [place] [address]   Culture, blood (Routine X 2) w Reflex to ID Panel     Status: None (Preliminary result)   Collection Time: 05/31/23  6:37 AM   Specimen: BLOOD  Result Value Ref Range Status   Specimen Description BLOOD BLOOD LEFT ARM  Final   Special Requests   Final    BOTTLES DRAWN AEROBIC AND ANAEROBIC Blood Culture results may not be optimal due to an inadequate volume of blood received in culture bottles   Culture   Final    NO GROWTH 1 DAY Performed at [facility], [address], [place], [place] [address]    Report Status PENDING  Incomplete  Culture, blood (Routine X 2) w Reflex to ID Panel     Status: None (Preliminary result)   Collection Time: 05/31/23  6:41 AM   Specimen: BLOOD  Result Value Ref Range Status   Specimen Description BLOOD BLOOD LEFT ARM  Final   Special Requests   Final    BOTTLES DRAWN AEROBIC AND ANAEROBIC Blood Culture results may not be optimal due to an inadequate volume of blood received in culture bottles   Culture   Final    NO GROWTH 1 DAY Performed at [facility], [address], [place], [place] [address]    Report Status PENDING  Incomplete  Aerobic/Anaerobic Culture w Gram Stain (surgical/deep wound)     Status: None (Preliminary result)   Collection Time: 05/31/23 10:13 AM   Specimen: Toe, Left; Amputation  Result Value Ref Range Status   Specimen Description TOE  Final   Special Requests A LEFT TOE  Final   Gram Stain   Final    NO WBC SEEN FEW GRAM NEGATIVE RODS RARE GRAM POSITIVE COCCI IN PAIRS Performed at [facility], [address] [place], [place] [address]    Culture PENDING  Incomplete   Report Status PENDING  Incomplete     Labs: Basic Metabolic Panel: Recent Labs  Lab 05/30/23 1718 05/31/23 0637 06/01/23 0546  NA 138 138 138  K 5.4* 5.1 3.8  CL 99 100 101  CO2 27 24 26   GLUCOSE 88 98 148*  BUN 41* 33* 21  CREATININE 1.58* 1.33* 1.07*  CALCIUM 9.4 9.6 8.9  MG  --  1.7 1.5*  PHOS  --  3.9  --    Liver Function Tests: Recent Labs  Lab 05/30/23 1718  AST 21  ALT 24  ALKPHOS 86  BILITOT 0.3  PROT 6.8  ALBUMIN 3.1*   No results for input(s): "LIPASE", "AMYLASE" in the last 168 hours. No results for input(s): "AMMONIA" in the last 168 hours. CBC: Recent Labs  Lab 05/30/23 1718 05/31/23 0637 06/01/23 0546  WBC 8.5 7.7 7.3  NEUTROABS 6.7  --   --   HGB 11.0* 10.7* 9.7*  HCT 34.0* 33.3* 29.5*  MCV 87.4 86.0 83.6  PLT 232 252 260   Cardiac Enzymes: No results for  input(s): "CKTOTAL", "CKMB", "CKMBINDEX", "TROPONINI" in the last 168 hours. BNP: BNP (last 3 results) No results for input(s): "BNP" in the last 8760 hours.  ProBNP (last 3 results) No results for input(s): "PROBNP" in the last 8760 hours.  CBG: Recent Labs  Lab 05/31/23 0858 05/31/23 1037 05/31/23 1623 05/31/23 2018  GLUCAP 84 97 126* 150*    Signed:  Rhetta Mura MD   [facility] 06/01/2023, 9:04 AM

## 2023-06-01 NOTE — Evaluation (Signed)
 Physical Therapy Evaluation Patient Details Name: Katie Booker MRN: [id number] DOB: 02/28/1945 Today's Date: 06/01/2023  History of Present Illness  Patient is a 78 yo female presenting to the ED with L great toe diabetic ulcer with infection on 05/30/23. X ray finding osteomyelitis of L great toe with amputation performed on 4/8.  PMH includes: type 2 diabetes, diabetic polyneuropathy, hyperlipidemia, hypertension, restless leg syndrome, iron deficiency anemia, hypothyroidism, obesity   Clinical Impression  Katie Booker is 78 y.o. female admitted with above HPI and diagnosis. Patient is currently limited by functional impairments below (see PT problem list). Patient lives alone and is mod independent with Rollator for mobility at baseline. Currently pt is limited by balance deficits and decreased endurance. Pt was able to complete sit<>stands with supervision and cues to safely manage rollator brakes. PT amb ~300' with seated rest at ~200', CGA fading to supervision for gait with pt demonstrating safe walker management. CGA for light steadying to use rollator seat for rest break. Rollator height adjusted for proper fit as pt c/o UE fatigue and soreness and pt reported improvement. EOS pt agreeable to remain OOB in recliner, RN arrived with paperwork. Patient will benefit from continued skilled PT interventions to address impairments and progress independence with mobility, recommending HHPT. Acute PT will follow and progress as able.         If plan is discharge home, recommend the following: A little help with walking and/or transfers;A little help with bathing/dressing/bathroom   Can travel by private vehicle        Equipment Recommendations None recommended by PT  Recommendations for Other Services       Functional Status Assessment Patient has had a recent decline in their functional status and demonstrates the ability to make significant improvements in function in a reasonable  and predictable amount of time.     Precautions / Restrictions Restrictions Weight Bearing Restrictions Per Provider Order: Yes LLE Weight Bearing Per Provider Order: Weight bearing as tolerated      Mobility  Bed Mobility                    Transfers       Sit to Stand: Supervision, Contact guard assist           General transfer comment: sup for sfaety, pt taking extra time to power up. CGA and cues for sequence to utilize rollator chair for seated rest with longer gait distance    Ambulation/Gait Ambulation/Gait assistance: Contact guard assist, Supervision Gait Distance (Feet): 300 Feet (seated rest at 200') Assistive device: Rollator (4 wheels) Gait Pattern/deviations: Step-through pattern, Decreased stride length, Trunk flexed, Decreased dorsiflexion - right, Decreased dorsiflexion - left Gait velocity: decr        Stairs            Wheelchair Mobility     Tilt Bed    Modified Rankin (Stroke Patients Only)       Balance                                             Pertinent Vitals/Pain      Home Living Family/patient expects to be discharged to:: Private residence Living Arrangements: Non-relatives/Friends Available Help at Discharge: Neighbor;Available PRN/intermittently Type of Home: House Home Access: Level entry       Home Layout: Multi-level;Full bath on main level;Able  to live on main level with bedroom/bathroom Home Equipment: Rollator (4 wheels);Shower Counsellor (2 wheels);Cane - quad      Prior Function Prior Level of Function : Independent/Modified Independent             Mobility Comments: walks with a rollator ADLs Comments: gets assist for transportation, otherwise independent     Extremity/Trunk Assessment   Upper Extremity Assessment Upper Extremity Assessment: Right hand dominant;Overall [facility] for tasks assessed    Lower Extremity Assessment Lower Extremity Assessment:  Defer to PT evaluation    Cervical / Trunk Assessment Cervical / Trunk Assessment: Kyphotic (minimally)  Communication   Communication Communication: No apparent difficulties    Cognition       PT - Cognitive impairments: No apparent impairments                                 Cueing       General Comments General comments (skin integrity, edema, etc.): VSS on RA    Exercises     Assessment/Plan    PT Assessment Patient needs continued PT services  PT Problem List Decreased strength;Decreased balance;Decreased activity tolerance;Decreased mobility;Decreased knowledge of use of DME;Decreased safety awareness;Decreased knowledge of precautions;Obesity       PT Treatment Interventions DME instruction;Gait training;Stair training;Functional mobility training;Therapeutic activities;Therapeutic exercise;Balance training;Neuromuscular re-education;Cognitive remediation;Patient/family education    PT Goals (Current goals can be found in the Care Plan section)  Acute Rehab PT Goals PT Goal Formulation: With patient Time For Goal Achievement: 06/15/23 Potential to Achieve Goals: Good    Frequency Min 2X/week     Co-evaluation               AM-PAC PT "6 Clicks" Mobility  Outcome Measure                  End of Session Equipment Utilized During Treatment: Gait belt Activity Tolerance: Patient tolerated treatment well Patient left: in chair;with call bell/phone within reach;with nursing/sitter in room Nurse Communication: Mobility status PT Visit Diagnosis: Other abnormalities of gait and mobility (R26.89);Muscle weakness (generalized) (M62.81);Difficulty in walking, not elsewhere classified (R26.2)    Time: 1610-9604 PT Time Calculation (min) (ACUTE ONLY): 28 min   Charges:   PT Evaluation $PT Eval Low Complexity: 1 Low PT Treatments $Gait Training: 8-22 mins PT General Charges $$ ACUTE PT VISIT: 1 Visit         Wynn Maudlin,  DPT Acute Rehabilitation Services Office [phone]  06/01/23 10:48 AM

## 2023-06-01 NOTE — Progress Notes (Signed)
 MEWS Progress Note  Patient Details Name: Ivorie Uplinger MRN: [id number] DOB: 03-Dec-1945 Today's Date: 06/01/2023   MEWS Flowsheet Documentation:  Assess: MEWS Score Temp: 97.7 F (36.5 C) BP: 130/80 MAP (mmHg): 98 Pulse Rate: 96 ECG Heart Rate: 86 Resp: 18 Level of Consciousness: Alert SpO2: 96 % O2 Device: Room Air Patient Activity (if Appropriate): In bed Assess: MEWS Score MEWS Temp: 0 MEWS Systolic: 0 MEWS Pulse: 0 MEWS RR: 0 MEWS LOC: 0 MEWS Score: 0 MEWS Score Color: Green Assess: SIRS CRITERIA SIRS Temperature : 0 SIRS Respirations : 0 SIRS Pulse: 1 SIRS WBC: 0 SIRS Score Sum : 1 SIRS Temperature : 0 SIRS Pulse: 1 SIRS Respirations : 0 SIRS WBC: 0 SIRS Score Sum : 1 Assess: if the MEWS score is Yellow or Red Were vital signs accurate and taken at a resting state?: Yes Does the patient meet 2 or more of the SIRS criteria?: No MEWS guidelines implemented : Yes, yellow Treat MEWS Interventions: Considered administering scheduled or prn medications/treatments as ordered Take Vital Signs Increase Vital Sign Frequency : Yellow: Q2hr x1, continue Q4hrs until patient remains green for 12hrs Escalate MEWS: Escalate: Yellow: Discuss with charge nurse and consider notifying provider and/or RRT        Denton Meek 06/01/2023, 3:29 AM

## 2023-06-01 NOTE — Progress Notes (Signed)
 Resources placed on AVS.

## 2023-06-01 NOTE — Hospital Course (Signed)
 Marland Kitchen

## 2023-06-01 NOTE — Evaluation (Signed)
 Occupational Therapy Evaluation Patient Details Name: Katie Booker MRN: [id number] DOB: 1946/01/21 Today's Date: 06/01/2023   History of Present Illness   Patient is a 78 yo female presenting to the ED with L great toe diabetic ulcer with infection on 05/30/23. X ray finding osteomyelitis of L great toe with amputation performed on 4/8.  PMH includes: type 2 diabetes, diabetic polyneuropathy, hyperlipidemia, hypertension, restless leg syndrome, iron deficiency anemia, hypothyroidism, obesity     Clinical Impressions Prior to this admission, patient living alone but has family and neighbors that can help her as needed. Patient was independent with ADLs, and walked with a rollator at baseline, she did not drive. Patient currently with no pain in foot, able to wear Darco shoe without complaint, and CGA for ADLs and functional mobility. OT recommending HHOT at discharge; OT will continue to follow acutely.     If plan is discharge home, recommend the following:   A little help with walking and/or transfers;A little help with bathing/dressing/bathroom;Assist for transportation     Functional Status Assessment   Patient has had a recent decline in their functional status and demonstrates the ability to make significant improvements in function in a reasonable and predictable amount of time.     Equipment Recommendations   None recommended by OT     Recommendations for Other Services         Precautions/Restrictions   Precautions Precautions: Fall Recall of Precautions/Restrictions: Intact Restrictions Weight Bearing Restrictions Per Provider Order: Yes LLE Weight Bearing Per Provider Order: Weight bearing as tolerated Other Position/Activity Restrictions: Darco shoe with movement     Mobility Bed Mobility               General bed mobility comments: up in recliner upon entry    Transfers Overall transfer level: Needs assistance Equipment used: Rollator (4  wheels) Transfers: Sit to/from Stand Sit to Stand: Contact guard assist           General transfer comment: CGA to rise, no cues for rollator management      Balance                                           ADL either performed or assessed with clinical judgement   ADL Overall ADL's : Needs assistance/impaired Eating/Feeding: Set up;Sitting   Grooming: Set up;Sitting   Upper Body Bathing: Set up;Sitting   Lower Body Bathing: Minimal assistance;Sit to/from stand;Sitting/lateral leans   Upper Body Dressing : Set up;Sitting   Lower Body Dressing: Minimal assistance;Sitting/lateral leans;Sit to/from stand   Toilet Transfer: Contact guard assist;Rollator (4 wheels);Ambulation   Toileting- Clothing Manipulation and Hygiene: Contact guard assist;Sitting/lateral lean;Sit to/from stand       Functional mobility during ADLs: Contact guard assist;Cueing for safety;Cueing for sequencing;Rollator (4 wheels) General ADL Comments: Prior to this admission, patient living alone but has family and neighbors that can help her as needed. Patient was independent with ADLs, and walked with a rollator at baseline, she did not drive. Patient currently with no pain in foot, able to wear Darco shoe without complaint, and CGA for ADLs and functional mobility. OT recommending HHOT at discharge; OT will continue to follow acutely.     Vision Baseline Vision/History: 1 Wears glasses Ability to See in Adequate Light: 0 Adequate Patient Visual Report: No change from baseline (WET AMD at baseline) Vision Assessment?: No apparent  visual deficits     Perception Perception: Not tested       Praxis Praxis: Not tested       Pertinent Vitals/Pain Pain Assessment Pain Assessment: 0-10 Pain Score: 1  Pain Location: low back pain Pain Descriptors / Indicators: Discomfort, Grimacing, Guarding Pain Intervention(s): Limited activity within patient's tolerance, Monitored during  session, Repositioned     Extremity/Trunk Assessment Upper Extremity Assessment Upper Extremity Assessment: Right hand dominant;Overall [facility] for tasks assessed   Lower Extremity Assessment Lower Extremity Assessment: Defer to PT evaluation   Cervical / Trunk Assessment Cervical / Trunk Assessment: Kyphotic (minimally)   Communication Communication Communication: No apparent difficulties   Cognition Arousal: Alert Behavior During Therapy: WFL for tasks assessed/performed Cognition: No apparent impairments             OT - Cognition Comments: Extremely tangential                 Following commands: Intact       Cueing  General Comments   Cueing Techniques: Verbal cues  VSS on RA   Exercises     Shoulder Instructions      Home Living Family/patient expects to be discharged to:: Private residence Living Arrangements: Non-relatives/Friends Available Help at Discharge: Neighbor;Available PRN/intermittently Type of Home: House Home Access: Level entry     Home Layout: Multi-level;Full bath on main level;Able to live on main level with bedroom/bathroom     Bathroom Shower/Tub: Producer, television/film/video: Handicapped height     Home Equipment: Rollator (4 wheels);Shower Counsellor (2 wheels);Cane - quad          Prior Functioning/Environment Prior Level of Function : Independent/Modified Independent             Mobility Comments: walks with a rollator ADLs Comments: gets assist for transportation, otherwise independent    OT Problem List: Decreased activity tolerance;Impaired balance (sitting and/or standing);Impaired sensation;Pain   OT Treatment/Interventions: Self-care/ADL training;Therapeutic exercise;Energy conservation;DME and/or AE instruction;Manual therapy;Modalities;Therapeutic activities;Patient/family education;Balance training      OT Goals(Current goals can be found in the care plan section)   Acute Rehab OT  Goals Patient Stated Goal: to get better OT Goal Formulation: With patient Time For Goal Achievement: 06/15/23 Potential to Achieve Goals: Good ADL Goals Pt Will Perform Lower Body Bathing: Independently;sitting/lateral leans Pt Will Perform Lower Body Dressing: Independently;sit to/from stand;sitting/lateral leans Pt Will Transfer to Toilet: Independently;ambulating;regular height toilet Pt Will Perform Toileting - Clothing Manipulation and hygiene: Independently;sitting/lateral leans;sit to/from stand Additional ADL Goal #1: Patient will be able to complete activity in standing for 3-5 minutes prior to needing seated rest break in order to increase overall activity tolerance.   OT Frequency:  Min 2X/week    Co-evaluation              AM-PAC OT "6 Clicks" Daily Activity     Outcome Measure Help from another person eating meals?: A Little Help from another person taking care of personal grooming?: A Little Help from another person toileting, which includes using toliet, bedpan, or urinal?: A Little Help from another person bathing (including washing, rinsing, drying)?: A Little Help from another person to put on and taking off regular upper body clothing?: A Little Help from another person to put on and taking off regular lower body clothing?: A Little 6 Click Score: 18   End of Session Equipment Utilized During Treatment: Gait belt;Rollator (4 wheels) Nurse Communication: Mobility status  Activity Tolerance: Patient tolerated treatment well  Patient left: in chair;with call bell/phone within reach;Other (comment) (with PT present)  OT Visit Diagnosis: Unsteadiness on feet (R26.81);Muscle weakness (generalized) (M62.81);Pain                Time: 4098-1191 OT Time Calculation (min): 40 min Charges:  OT General Charges $OT Visit: 1 Visit OT Evaluation $OT Eval Moderate Complexity: 1 Mod OT Treatments $Self Care/Home Management : 23-37 mins  Pollyann Glen E. Devon Kingdon,  OTR/L Acute Rehabilitation Services [phone]   Cherlyn Cushing 06/01/2023, 10:13 AM

## 2023-06-01 NOTE — Discharge Instructions (Signed)
 SUNDAYS BREAKFAST TWO LOCATIONS: 8:00am served in [facility] by Awaken [employer] 8:30am SHUTTLE provided from [facility], served at [employer], [address] LUNCH TWO LOCATIONS [plus one additional third Sunday only] 10:30am - 12:30pm served at [employer], [employer], [place] [address] (1.2 miles from [facility]) 12:30pm served in [place] by Land O'Lakes Team (THIRD Sunday only) 1:30pm served at [name] by [facility] one location [plus one additional third Sunday only] 5:00pm Every Sunday, served under the bridge at [address] by Lindell Noe Under the 3M Company (.7 miles from [facility]) (THIRD Sunday ONLY) 4:00pm served in the parking garage, across from Nucor Corporation, corner of [place] and [place] by Ryland Group Works Ministries MONDAYS BREAKFAST 7:30am served in [employer] by the United States [employer] and Friends LUNCH 10:30am - 12:30pm served at [employer], [employer], [place] [address] (1.2 miles from [facility]) DINNER TWO LOCATIONS: 7:00pm served in front of the courthouse at the corner of Goldman Sachs and International Business Machines. by [facility] Monday Night Meal (3 blocks from [facility]) 4:30pm served at the [employer], [address] by The Procter & Gamble Not Bombs (0.6 miles from [facility]) [place] BREAKFAST 8:00am - 9:00am served at The TJX Companies, [address] (0.3 miles from [place]) LUNCH 10:30am - 12:30pm served at the [employer], [employer] [address] (1.2 miles from [place]) DINNER 6:00pm served at CSX Corporation, enter from Capital One and go to the Sonic Automotive, (0.7 miles from [place]) [facility] BREAKFAST 7:00am - 8:00am served at [employer], [employer] [address] (1.2 miles from [place]) LUNCH ONE LOCATION [plus two additional locations listed below] 10:30am - 12:30pm served at [employer], [employer] [address] (1.2 miles from [place]) (FIRST Wednesday ONLY) 11:30am served at Dillard's, [place] [address] (6.6 miles from [place]) (SECOND Wednesday ONLY) 11:00am served at [place]. Melvyn Novas of [address], [address] (1.3 miles from [place]) [place] TWO LOCATIONS 6:00pm served at W. R. Berkley, [place] W. Viste[address] Corporation. (1.3 miles from [facility]) 4:00pm - 6:00pm (hot dogs and chips) served at Levi Strauss of [facility], [address] (1.7 miles from [place]) [place] BREAKFAST NOT AVAILABLE AT THIS TIME LUNCH 10:30am - 12:30pm served at [employer], [employer], [place] [address] (1.2 miles from [place]) DINNER 6:00pm served at CSX Corporation, enter from Capital One and go to the S[employer]otive, (0.7 miles from Nucor Corporation) [place] BREAKFAST NOT AVAILABLE AT THIS TIME LUNCH 10:30am - 12:30pm served at [employer], [employer] [address], (1.2 miles from [place]) DINNER TWO LOCATIONS, [plus one additional first Friday only] 6:00pm served under the bridge at [address] by Lindell Noe Under CSX [employer]. (.7 miles from [facility]) 5:00pm - 7:00pm served at Levi Strauss of [facility] [place], [address] (1.7 miles from [place]) (FIRST Friday ONLY) 5:45 pm - SHUTTLE provided from the LIBRARY at 5:45pm. Served at [facility], [address] SATURDAYS BREAKFAST TWO LOCATIONS [plus one additional last Saturday only] 8:00am served at [facility] by Gannett Co  Bikers 8:30am served at Pulte Homes, [address]. (2.2 miles from [facility]) (LAST Saturday ONLY) 8:30am served at  Beazer Homes, [address] (5 miles from [place]) LUNCH 10:30am - 12:30pm served at [employer], [employer] [address] (1.2 miles from [facility]) DINNER 6:00pm served under the bridge at [address] by World Fuel Services Corporation (0.7 miles from Nucor Corporation)  DIRECTIONS FROM [place] TO ALL MEAL LOCATIONS The Bridge at [address] (.7 miles from [address]) [address] on [place]. Turn Right onto DIRECTV 433 ft. Continue onto [address] under bridge, about 500 ft. Courthouse (3 blocks from [facility]) [place] on [address]. Turn right on [place] 1 block to PPL Corporation (.5 miles from [place]) [place] on [place]. YRC Worldwide. past Brink's Company to EMCOR. Enter from Capital One and go to the Affiliated Computer Services building W. R. Berkley [address] Viste[address]. (1.3 miles from [facility]) [address] on [place]. Turn Right onto [address]. church will be on the Left. The TJX Companies [address] (.3 miles from [facility]) Go .3 miles on [address] Destination is on your right Dillard's at ONEOK (6.6 miles from [address]) [address] on [place] toward W Friendly Turn right onto W Friendly Continue onto Alcoa Inc. Continue onto Toll Brothers. 5. Elesa Hacker is on right [facility] Conseco) [address] (.6 miles from [address]) [place] on [place]. [address] Turn Left onto [address] 0.3 miles Destination is on the Left. [place] Black & Decker at American Express (5 miles from Nucor Corporation) 1. Head south on [address]. Turn right onto [address] Turn slightly left onto Quest Diagnostics Continue onto Quest Diagnostics Turn right at Barnes & Noble Continue to church on right [place] Sounds of [facility] [address] (1.7 miles  from [place]) [address] on [place] 1.4 miles [place] becomes [place]. [address] Continue 0.6 miles and church will be on theright. [place] Guardian Life Insurance at [address] (2.5 miles from Nucor Corporation) [place] provided from Massachusetts Mutual Life [place]] [place] on [place]. Elm toward Estée Lauder right onto Costco Wholesale left onto Henry Schein left onto Micron Technology [address] Southern Company (2.2 miles from [address]) [address] on [place] 1.4 miles [place] becomes [place]. Elm [address] Turn right onto [address] and church will be on the Left. [place] AT&T [address] (1.2 miles from [facility]) 1.Turn right onto [facility] 2.Turn left onto Rogue Jury 3.Reino Kent 4.Destination is on your right [place]. Melvyn Novas of [address] at ToysRus (1.3 miles from [facility]) [address] on [address] Turn left onto Genuine Parts right onto [address]. [address]. Continue onto KB Home	[place]. Turn left onto Smurfit[employer]Stone Container. Turn right onto WellPoint.

## 2023-06-01 NOTE — TOC Initial Note (Signed)
 Transition of Care (TOC) - Initial/Assessment Note   Spoke to patient at bedside, regarding HHPT/OT. She has had home helath in the past and has no preference.   Cory with [facility] accepted referral. AVS updated.   Patient has walker, elevated commode seat and Rollator at home   Patient Details  Name: Katie Booker MRN: [id number] Date of Birth: 1945/10/03  Transition of Care [facility]) CM/SW Contact:    [place] Plan, RN Phone Number: 06/01/2023, 10:23 AM  Clinical Narrative:                   Expected Discharge Plan: Home w Home Health Services Barriers to Discharge: No Barriers Identified   Patient Goals and CMS Choice Patient states their goals for this hospitalization and ongoing recovery are:: to return to home CMS Medicare.gov Compare Post Acute Care list provided to:: Patient Choice offered to / list presented to : Patient      Expected Discharge Plan and Services   Discharge Planning Services: CM Consult Post Acute Care Choice: Home Health Living arrangements for the past 2 months: Apartment Expected Discharge Date: 06/01/23               DME Arranged: N/A DME Agency: NA       HH Arranged: PT, OT HH Agency: [facility] Date [facility] Agency Contacted: 06/01/23 Time HH Agency Contacted: 1022 Representative spoke with at [facility] Agency: Kandee Keen  Prior Living Arrangements/Services Living arrangements for the past 2 months: Apartment Lives with:: Self Patient language and need for interpreter reviewed:: Yes Do you feel safe going back to the place where you live?: Yes      Need for Family Participation in Patient Care: Yes (Comment) Care giver support system in place?: Yes (comment) Current home services: DME Criminal Activity/Legal Involvement Pertinent to Current Situation/Hospitalization: No - Comment as needed  Activities of Daily Living   ADL Screening (condition at time of admission) Independently performs ADLs?: Yes (appropriate for developmental  age) Is the patient deaf or have difficulty hearing?: No Does the patient have difficulty seeing, even when wearing glasses/contacts?: No Does the patient have difficulty concentrating, remembering, or making decisions?: No  Permission Sought/Granted   Permission granted to share information with : Yes, Verbal Permission Granted     Permission granted to share info w AGENCY: Home health agencies        Emotional Assessment Appearance:: Appears stated age Attitude/Demeanor/Rapport: Engaged Affect (typically observed): Appropriate Orientation: : Oriented to Self, Oriented to Place, Oriented to  Time, Oriented to Situation Alcohol / Substance Use: Not Applicable Psych Involvement: No (comment)  Admission diagnosis:  Hyperkalemia [E87.5] Diabetic foot ulcer (HCC) [N82.956, L97.509] Cellulitis of left foot [L03.116] AKI (acute kidney injury) (HCC) [N17.9] Diabetic foot infection (HCC) [O13.086, L08.9] Osteomyelitis of great toe of left foot (HCC) [M86.9] Patient Active Problem List   Diagnosis Date Noted   Osteomyelitis of great toe of left foot (HCC) 05/31/2023   Diabetic foot ulcer (HCC) 05/30/2023   Complete rupture of left Achilles tendon 11/03/2021   Morbid obesity (HCC) 01/13/2018   S/P knee replacement 12/27/2017   Spondylolisthesis at L1-L2 level 03/18/2015   Spondylosis with myelopathy 09/02/2014   Cervical spondylosis with myelopathy 09/02/2014   Anxiety 12/21/2012   Chronic back pain 12/21/2012   Insomnia 12/21/2012   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, L2-5 12/15/2012   Spinal stenosis, lumbar region, with neurogenic claudication 10/26/2012   Spinal stenosis, thoracic 10/26/2012   Umbilical hernia 02/25/2012  OSA (obstructive sleep apnea) 11/12/2011   Exogenous obesity 11/12/2011   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   Hypothyroidism 09/18/2009   Diabetes mellitus without complication (HCC) 09/18/2009   Dyslipidemia 09/18/2009   Anemia 09/18/2009    Depression 09/18/2009   Essential hypertension 09/18/2009   GERD 09/18/2009   Primary localized osteoarthritis of left knee 09/18/2009   PCP:  Philip Aspen, Limmie Patricia, MD Pharmacy:   [id number] [place], [place] - [address] AT [facility] [address] [place] [address] Phone: [phone] Fax: [phone]  [facility] Delivery - [place], [place] - [address] [place] [address] Phone: [phone] Fax: [phone]     Social Drivers of Health (SDOH) Social History: SDOH Screenings   Food Insecurity: No Food Insecurity (05/31/2023)  Recent Concern: Food Insecurity - Food Insecurity Present (04/18/2023)  Housing: Low Risk  (05/31/2023)  Transportation Needs: No Transportation Needs (05/31/2023)  Utilities: Not At Risk (05/31/2023)  Alcohol Screen: Low Risk  (04/18/2023)  Depression (PHQ2-9): Medium Risk (04/18/2023)  Financial Resource Strain: High Risk (04/18/2023)  Physical Activity: Inactive (04/18/2023)  Social Connections: Socially Isolated (05/31/2023)  Stress: Stress Concern Present (04/18/2023)  Tobacco Use: Medium Risk (05/31/2023)  Health Literacy: Adequate Health Literacy (04/18/2023)   SDOH Interventions: Food Insecurity Interventions: Inpatient TOC   Readmission Risk Interventions     No data to display

## 2023-06-01 NOTE — Progress Notes (Signed)
Discharge instructions given to pt. Pt verbalized understanding of all teaching and had no further questions. 

## 2023-06-01 NOTE — Progress Notes (Signed)
  Subjective:  Patient ID: [name], female    DOB: 10/09/1945,  MRN: [id number]  Chief Complaint  Patient presents with   Wound Infection    DOS: 05/31/2023  Procedure: Amputation of great toe at MPJ level, left foot   78 y.o. female seen for post op check.  Patient reports she is doing well she denies pain in the left foot.  Is working with physical therapy.  Reports plan is for discharge today and that Dr. Mahala Menghini will prescribe antibiotics on discharge.  Review of Systems: Negative except as noted in the HPI. Denies N/V/F/Ch.   Objective:   Vitals:   06/01/23 0751 06/01/23 0839  BP: 129/87 129/87  Pulse: 92 92  Resp: 16   Temp: 98.6 F (37 C)   SpO2: 100%    Body mass index is 38.47 kg/m. Constitutional Well developed. Well nourished.  Vascular Foot warm and well perfused. Capillary refill normal to all digits.   No calf pain with palpation  Neurologic Normal speech. Oriented to person, place, and time. Epicritic sensation diminished left foot  Dermatologic Dressing is clean dry and intact left foot  Orthopedic: Status post left hallux amputation MPJ level   Radiographs: Disarticulation of left great toe at MPJ level  Pathology: Pending  Micro: Few gram-negative rods few gram-positive cocci in pairs  Assessment:   Osteomyelitis of left hallux status post amputation MPJ level  Plan:  Patient was evaluated and treated and all questions answered.  POD # 1 s/p left hallux amputation -Progressing well postoperatively moving well with PT and no pain -XR: Expected postop changes -WB Status: Weightbearing as tolerated in postop shoe -Sutures: Remain intact for 2 to 3 weeks. -Medications/ABX: Augmentin postop for 7 to 10 days Dressing to remain clean dry and intact until office follow-up next week Monday or Tuesday will have office call to arrange         Corinna Gab, DPM [facility]

## 2023-06-02 ENCOUNTER — Telehealth: Payer: Self-pay

## 2023-06-02 NOTE — Transitions of Care (Post Inpatient/ED Visit) (Signed)
   06/02/2023  Name: Kabao Leite MRN: [id number] DOB: 1945/08/25  Today's TOC FU Call Status: Today's TOC FU Call Status:: Unsuccessful Call (1st Attempt) Unsuccessful Call (1st Attempt) Date: 06/02/23  Attempted to reach the patient regarding the most recent Inpatient/ED visit.  Follow Up Plan: Additional outreach attempts will be made to reach the patient to complete the Transitions of Care (Post Inpatient/ED visit) call.   Lonia Chimera, RN, BSN, CEN [employer]- Transition of Care Team.  [facility] [phone]

## 2023-06-03 ENCOUNTER — Telehealth: Payer: Self-pay

## 2023-06-03 ENCOUNTER — Ambulatory Visit (INDEPENDENT_AMBULATORY_CARE_PROVIDER_SITE_OTHER): Admitting: Podiatry

## 2023-06-03 ENCOUNTER — Telehealth: Payer: Self-pay | Admitting: Urology

## 2023-06-03 ENCOUNTER — Encounter: Payer: Self-pay | Admitting: Podiatry

## 2023-06-03 DIAGNOSIS — Z89412 Acquired absence of left great toe: Secondary | ICD-10-CM

## 2023-06-03 DIAGNOSIS — M86172 Other acute osteomyelitis, left ankle and foot: Secondary | ICD-10-CM | POA: Diagnosis not present

## 2023-06-03 NOTE — Progress Notes (Signed)
 Subjective:  Patient ID: Katie Booker, female    DOB: 1945/03/06,  MRN: [id number]  Chief Complaint  Patient presents with   Routine Post Op    Bandage change dos 06/01/23 "On Wednesday, when I was released from the hospital and got home, I urinated on myself while walking down the hall.  I wet my shoe."    DOS: 06/01/2023 Procedure: Left first toe amputation  78 y.o. female returns for post-op check.  Recently discharged from the hospital.  Unfortunately the patient had an accident at home, urinated on herself and this did soil the dressing and her surgical shoe.  She comes in today to have surgical site inspected and to change dressing.  Review of Systems: Negative except as noted in the HPI. Denies N/V/F/Ch.  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   Depression    DOE (dyspnea on exertion)    GERD    Headache    Hemorrhoids    History of basal cell carcinoma excision    left cheek   History of colon polyps    History of hyperthyroidism 2011   RAI treatement   History of panic attacks    Hyperlipidemia    Hypertension    Hypothyroidism, postradioiodine therapy    followed by pcp   IDA (iron deficiency anemia)    intermittant   Insomnia    OA (osteoarthritis)    OSA (obstructive sleep apnea)    no cpap, per pt tried unable to tolerate   Peripheral neuropathy    legs and feet from hx back surgery's   Rash    bilateral axilla area -- per pt due to some personal wipes used other the counter   RLS (restless legs syndrome)    Spondylolisthesis at L1-L2 level    Tingling of both upper extremities    left greater than right due to cervical pinched nerve   Type 2 diabetes mellitus (HCC)    followed by pcp    Umbilical hernia    Urinary frequency    Urinary urgency     Current Outpatient Medications:    ALPRAZolam (XANAX) 0.5 MG tablet, Take 1 tablet (0.5 mg total) by mouth 2 (two) times daily., Disp: 90 tablet, Rfl: 2   amoxicillin-clavulanate (AUGMENTIN) 875-125 MG tablet, Take 1 tablet by mouth 2 (two) times daily., Disp: 14 tablet, Rfl: 0   atenolol (TENORMIN) 25 MG tablet, TAKE 1 TABLET BY MOUTH DAILY, Disp: 100 tablet, Rfl: 2   atorvastatin (LIPITOR) 40 MG tablet, TAKE 1 TABLET BY MOUTH DAILY, Disp: 100 tablet, Rfl: 2   calcium citrate (CALCITRATE - DOSED IN MG ELEMENTAL CALCIUM) 950 (200 Ca) MG tablet, Take 200 mg of elemental calcium by mouth daily., Disp: , Rfl:    Carboxymethylcell-Hypromellose (GENTEAL OP), Apply to eye., Disp: , Rfl:    Carboxymethylcellulose Sodium (LUBRICANT EYE DROPS OP), Apply 2 drops to eye 4 (four) times daily as needed (dry eyes). , Disp: , Rfl:    estradiol (ESTRACE) 0.1 MG/GM vaginal cream, Place 0.5 g vaginally 2 (two) times a week. Place 0.5g nightly for two weeks then twice a week after, Disp: 42.5 g, Rfl: 11  HYDROcodone-acetaminophen (NORCO) 7.5-325 MG tablet, Take 1 tablet by mouth every 6 (six) hours as needed for moderate pain (pain score 4-6). (Patient taking differently: Take 1 tablet by mouth every 6 (six) hours as needed for moderate pain (pain score 4-6). Takes 4 times per day), Disp: , Rfl:    hydrocortisone 2.5 % cream, APPLY TO AFFECTED AREA(S)  TOPICALLY TWICE DAILY, Disp: 113.4 g, Rfl: 0   ibuprofen (ADVIL) 200 MG tablet, Take 200 mg by mouth daily. 1-2 tablets daily, Disp: , Rfl:    Lancets (ONETOUCH ULTRASOFT) lancets, Use daily, Disp: 100 each, Rfl: 6   levothyroxine (SYNTHROID) 125 MCG tablet, TAKE 1 TABLET BY MOUTH DAILY, Disp: 100 tablet, Rfl: 1   meloxicam (MOBIC) 15 MG tablet, TAKE 1 TABLET BY MOUTH DAILY, Disp: 100 tablet, Rfl: 2   metFORMIN (GLUCOPHAGE) 1000 MG tablet, TAKE 1 TABLET BY MOUTH TWICE  DAILY WITH MEALS, Disp: 200 tablet,  Rfl: 1   Multiple Vitamin (MULTIVITAMIN) tablet, Take 1 tablet by mouth daily., Disp: , Rfl:    Omega-3 Fatty Acids (FISH OIL) 1200 MG CAPS, Take 1 capsule by mouth daily., Disp: , Rfl:    ONE TOUCH ULTRA TEST test strip, USE 1 DAILY AS DIRECTED (Patient taking differently: 1 each by Other route as needed for other.), Disp: 100 each, Rfl: 3   pantoprazole (PROTONIX) 40 MG tablet, Take 1 tablet (40 mg total) by mouth 2 (two) times daily before a meal., Disp: 200 tablet, Rfl: 1   pioglitazone (ACTOS) 15 MG tablet, TAKE 1 TABLET BY MOUTH DAILY, Disp: 100 tablet, Rfl: 2   pramipexole (MIRAPEX) 0.125 MG tablet, Take 1 tablet (0.125 mg total) by mouth at bedtime., Disp: 30 tablet, Rfl: 2   pregabalin (LYRICA) 75 MG capsule, TAKE 1 CAPSULE BY MOUTH TWICE  DAILY, Disp: 180 capsule, Rfl: 0   tiZANidine (ZANAFLEX) 4 MG tablet, TAKE 1 TABLET BY MOUTH 3 TIMES  DAILY AS NEEDED, Disp: 180 tablet, Rfl: 0   tobramycin (TOBREX) 0.3 % ophthalmic solution, Place 1 drop into both eyes every 6 (six) hours. Day before injection, day of injection and day after injection, Disp: , Rfl:    triamcinolone cream (KENALOG) 0.1 %, Apply 1 Application topically 2 (two) times daily., Disp: 80 g, Rfl: 0   vortioxetine HBr (TRINTELLIX) 5 MG TABS tablet, Take 10 mg by mouth daily., Disp: , Rfl:   Social History   Tobacco Use  Smoking Status Former   Current packs/day: 0.00   Average packs/day: 1 pack/day for 20.0 years (20.0 ttl pk-yrs)   Types: Cigarettes   Start date: 02/23/1984   Quit date: 02/23/2004   Years since quitting: 19.2  Smokeless Tobacco Never  Tobacco Comments   quit 2007    Allergies  Allergen Reactions   Duraprep [Antiseptic Products, Misc.] Itching and Rash    Irritation everywhere prep was used   Advil Allergy Sinus [Chlorpheniramine-Pse-Ibuprofen]     Nervousness    Betadine [Povidone Iodine] Other (See Comments)    Burning sensation.    Hydromorphone Hcl Other (See Comments)    Makes crazy    Morphine And Codeine Other (See Comments)    hallucinations   Objective:  There were no vitals filed for this visit. There is no height or weight on file to calculate BMI. Constitutional Well developed. Well nourished.  Vascular Foot warm and well perfused. Capillary refill normal to all digits.   Neurologic Normal speech. Oriented to person, place, and time. Epicritic sensation to light touch  grossly foot  Dermatologic Sutures are intact to left first toe amputation site.  Skin edges well coapted. Mild erythema about the incision.  Orthopedic: Status post left hallux amputation MPJ level    Assessment:   1. Acute osteomyelitis of toe, left (HCC)   2. Status post amputation of great toe, left (HCC)    Plan:  Patient was evaluated and treated and all questions answered.  S/p foot surgery left -Dressing reapplied, Xeroform to incision site, 4 x 4 gauze, Kerlix, Ace wrap. - May weight-bear as tolerated in surgical shoe favoring heel.  Replaced surgical shoe today. -Sutures: Left intact. -Medications: Currently on Augmentin from hospital, continue and complete the course. - Keep assigned follow-up

## 2023-06-03 NOTE — Transitions of Care (Post Inpatient/ED Visit) (Signed)
   06/03/2023  Name: Katie Booker MRN: [id number] DOB: 30-Dec-1945  Today's TOC FU Call Status: Today's TOC FU Call Status:: Unsuccessful Call (2nd Attempt) Unsuccessful Call (2nd Attempt) Date: 06/03/23  Attempted to reach the patient regarding the most recent Inpatient/ED visit.  Follow Up Plan: Additional outreach attempts will be made to reach the patient to complete the Transitions of Care (Post Inpatient/ED visit) call.   Lonia Chimera, RN, BSN, CEN [employer]- Transition of Care Team.  [facility] [phone]

## 2023-06-03 NOTE — Telephone Encounter (Signed)
 Marchelle Folks RN case manager with Transition of care called stating that pt had an accident and peed on herself and got her bandage and boot wet. She can't get her bandage to stay that she may need to come in and be seen. I called the pt to see if she was able to come in today to be seen for a dressing change and a new boot, Pt stated that she has not worn the boot since it got pee on it. pt stated she would have to call her friend to see if she can get her there. Pt called back stating that she could be here between 12:30 and 1pm. Dr. Jamse Arn stated that he could see her, She has an appt at 1:30pm today with Dr. Jamse Arn. I called Marchelle Folks back to let her know that she will be seen today.

## 2023-06-03 NOTE — Transitions of Care (Post Inpatient/ED Visit) (Signed)
 06/03/2023  Name: Katie Booker MRN: [id number] DOB: 1946/01/31  Today's TOC FU Call Status: Today's TOC FU Call Status:: Successful TOC FU Call Completed TOC FU Call Complete Date: 06/03/23 Patient's Name and Date of Birth confirmed.  Transition Care Management Follow-up Telephone Call How have you been since you were released from the hospital?: Better (denies pain, reports that she feels her foot is doing fine.  Gauze is shredding.) Any questions or concerns?: Yes Patient Questions/Concerns:: dressing coming off and not wearing boot due to urine. Patient Questions/Concerns Addressed: Other: (patient to call MD office)  Items Reviewed: Did you receive and understand the discharge instructions provided?: Yes Medications obtained,verified, and reconciled?: Yes (Medications Reviewed) Any new allergies since your discharge?: No Dietary orders reviewed?: Yes Type of Diet Ordered:: low sodium heart healthy diet Do you have support at home?: Yes People in Home [RPT]: alone Name of Support/Comfort Primary Source: Neighbor that assist.  Son and daughter in law live in [place]  Medications Reviewed Today: Medications Reviewed Today     Reviewed by Vanetta Generous, RN (Registered Nurse) on 06/03/23 at 1022  Med List Status: <None>   Medication Order Taking? Sig Documenting Provider Last Dose Status Informant  ALPRAZolam (XANAX) 0.5 MG tablet [id number] Yes Take 1 tablet (0.5 mg total) by mouth 2 (two) times daily. Zilphia Hilt, Charyl Coppersmith, MD Taking Active   amoxicillin-clavulanate (AUGMENTIN) 875-125 MG tablet [id number] Yes Take 1 tablet by mouth 2 (two) times daily. Samtani, Jai-Gurmukh, MD Taking Active   atenolol (TENORMIN) 25 MG tablet [id number] Yes TAKE 1 TABLET BY MOUTH DAILY Zilphia Hilt, Charyl Coppersmith, MD Taking Active   atorvastatin (LIPITOR) 40 MG tablet [id number] Yes TAKE 1 TABLET BY MOUTH DAILY Zilphia Hilt, Charyl Coppersmith, MD Taking Active   calcium citrate (CALCITRATE -  DOSED IN MG ELEMENTAL CALCIUM) 950 (200 Ca) MG tablet [id number] Yes Take 200 mg of elemental calcium by mouth daily. [provider] Taking Active            Med Note (ROSE, Edyth Glomb U   Fri Jun 03, 2023 10:14 AM) Taking 600 mg  daily  Carboxymethylcell-Hypromellose (GENTEAL OP) [id number] Yes Apply to eye. [provider] Taking Active   Carboxymethylcellulose Sodium (LUBRICANT EYE DROPS OP) [id number] Yes Apply 2 drops to eye 4 (four) times daily as needed (dry eyes).  [provider] Taking Active Self  estradiol (ESTRACE) 0.1 MG/GM vaginal cream [id number] Yes Place 0.5 g vaginally 2 (two) times a week. Place 0.5g nightly for two weeks then twice a week after Zuleta, Kaitlin G, NP Taking Active   HYDROcodone-acetaminophen (NORCO) 7.5-325 MG tablet [id number] Yes Take 1 tablet by mouth every 6 (six) hours as needed for moderate pain (pain score 4-6).  Patient taking differently: Take 1 tablet by mouth every 6 (six) hours as needed for moderate pain (pain score 4-6). Takes 4 times per day   Samtani, Jai-Gurmukh, MD Taking Active   hydrocortisone 2.5 % cream [id number] No APPLY TO AFFECTED AREA(S)  TOPICALLY TWICE DAILY  Patient not taking: Reported on 06/03/2023   Zilphia Hilt, Charyl Coppersmith, MD Not Taking Active   ibuprofen (ADVIL) 200 MG tablet [id number] Yes Take 200 mg by mouth daily. 1-2 tablets daily [provider] Taking Active   Lancets Emmett Harman ULTRASOFT) lancets [id number] Yes Use daily Kwiatkowski, Peter F, MD Taking Active Self  levothyroxine (SYNTHROID) 125 MCG tablet [id number] Yes TAKE 1 TABLET BY MOUTH DAILY Zilphia Hilt, Charyl Coppersmith, MD Taking Active   meloxicam (  MOBIC) 15 MG tablet [id number] Yes TAKE 1 TABLET BY MOUTH DAILY Zilphia Hilt, Charyl Coppersmith, MD Taking Active   metFORMIN (GLUCOPHAGE) 1000 MG tablet [id number] Yes TAKE 1 TABLET BY MOUTH TWICE  DAILY WITH MEALS Zilphia Hilt, Charyl Coppersmith, MD Taking Active   Multiple Vitamin (MULTIVITAMIN) tablet  [id number] Yes Take 1 tablet by mouth daily. [provider] Taking Active Self  Omega-3 Fatty Acids (FISH OIL) 1200 MG CAPS [id number] No Take 1 capsule by mouth daily.   Patient not taking: Reported on 06/03/2023   [provider] Not Taking Active Self           Med Note (ROSE, Shreyansh Tiffany U   Fri Jun 03, 2023 10:16 AM) Currently out of this medication  ONE TOUCH ULTRA TEST test strip [id number] Yes USE 1 DAILY AS DIRECTED  Patient taking differently: 1 each by Other route as needed for other.   Hillery Lown, MD Taking Active Self  pantoprazole (PROTONIX) 40 MG tablet [id number] Yes Take 1 tablet (40 mg total) by mouth 2 (two) times daily before a meal. Zilphia Hilt, Charyl Coppersmith, MD Taking Active            Med Note (ROSE, Juddson Cobern U   Fri Jun 03, 2023 10:17 AM) Normally takes one a day  pioglitazone (ACTOS) 15 MG tablet [id number] Yes TAKE 1 TABLET BY MOUTH DAILY Zilphia Hilt, Charyl Coppersmith, MD Taking Active   pramipexole (MIRAPEX) 0.125 MG tablet [id number] Yes Take 1 tablet (0.125 mg total) by mouth at bedtime. Zilphia Hilt, Charyl Coppersmith, MD Taking Active            Med Note Carnell Christian   Wed May 04, 2023  1:34 PM)    pregabalin (LYRICA) 75 MG capsule [id number] Yes TAKE 1 CAPSULE BY MOUTH TWICE  DAILY Zilphia Hilt, Charyl Coppersmith, MD Taking Active            Med Note (ROSE, Beyonka Pitney U   Fri Jun 03, 2023 10:18 AM) Kent Pear one per day  tiZANidine (ZANAFLEX) 4 MG tablet [id number] Yes TAKE 1 TABLET BY MOUTH 3 TIMES  DAILY AS NEEDED Zilphia Hilt, Charyl Coppersmith, MD Taking Active            Med Note (ROSE, Kenitra Leventhal U   Fri Jun 03, 2023 10:19 AM) Kent Pear one per day  tobramycin (TOBREX) 0.3 % ophthalmic solution [id number] Yes Place 1 drop into both eyes every 6 (six) hours. Day before injection, day of injection and day after injection [provider] Taking Active            Med Note (ROSE, Val Farnam U   Fri Jun 03, 2023 10:19 AM) Kent Pear with injections  triamcinolone cream  (KENALOG) 0.1 % [id number] Yes Apply 1 Application topically 2 (two) times daily. Zilphia Hilt, Charyl Coppersmith, MD Taking Active   vortioxetine HBr (TRINTELLIX) 5 MG TABS tablet [id number] No Take 10 mg by mouth daily.  Patient not taking: Reported on 06/03/2023   [provider] Not Taking Active            Med Note (ROSE, Anayiah Howden U   Fri Jun 03, 2023 10:21 AM) Ranelle Buys on [facility] and Equipment/Supplies: Were Home Health Services Ordered?: Yes [facility] of [facility] Has Agency set up a time to come to your home?: No EMR reviewed for Home Health Orders:  (patient wants to call herself. Confrimed that patient  has phone number.) Any new equipment or medical supplies ordered?: No  Functional Questionnaire: Do you need assistance with bathing/showering or dressing?: Yes (shower head broke last week. Son to fix. Able to take a sponge bath herself.) Do you need assistance with meal preparation?: Yes (friend is supplying meals.) Do you need assistance with eating?: No Do you have difficulty maintaining continence: Yes (problems with incontience after getting home. Reports she tries to go to the bathroom as soon as she feels urge.) Do you need assistance with getting out of bed/getting out of a chair/moving?: No Do you have difficulty managing or taking your medications?: No  Follow up appointments reviewed: PCP Follow-up appointment confirmed?: No (patient will call and make an appointment) MD Provider Line Number:[phone] Given: No Specialist Hospital Follow-up appointment confirmed?: Yes Date of Specialist follow-up appointment?: 06/06/23 Follow-Up Specialty Provider:: Dr. Clydia Dart Do you need transportation to your follow-up appointment?: No (a friend assist or she takes a UBER) Do you understand care options if your condition(s) worsen?: Yes-patient verbalized understanding  SDOH Interventions Today    Flowsheet Row Most Recent Value  SDOH  Interventions   Food Insecurity Interventions Intervention Not Indicated  Housing Interventions Intervention Not Indicated  Transportation Interventions Intervention Not Indicated, Other (Comment)  [currently reports she is able to mange her transportation needs]  Utilities Interventions Intervention Not Indicated      Lives in a handicap accessible home.  Patient reports she lives alone and does the best that she can, Her family live out of state.  Reports a neighbor assist her for her needs.  Reports poor balance that is the reason for her falls.  Reports chronic joint pain of her knees and back.  Patient reports that she urinated on herself and into her boot and on her foot dressing the day of discharge. Patient reports that she has rewrapped her foot multiple times. Reports she is not wearing her boot due to the fact she has not been able to clean her foot since she had the urinary accident.  Patient reports that she struggles with day to day activities like paying bills.  Reports she is not driving right now due to inability to pay her car insurance. Patient reports son sent her some money and now she will be to get her medications next week. PCP is supplying her some samples. Patient has not heard from home health and reports she will call them herself. Reviewed my concern about patient needing to inform MD about her dressing and boot. Placed call to [facility] and had to leave a message requesting a call back. 11:20 Placed call to [facility] and spoke with Odilia Bennett. Reported concern about urine on dressing and dressing not staying on wound and patient not wearing her boot. Odilia Bennett will call me back after speaking to the MD. 1200 noon Received a voicemail from [place] that patient will be seen at 1230 today. Continue to await a call from Home health.  Spoke with Shelvy Dickens who confirms home health orders for PT and states someone will be reaching out to patient soon.  Orpha Blade, RN, BSN,  CEN [employer]- Transition of Care Team.  [facility] [phone]

## 2023-06-05 LAB — CULTURE, BLOOD (ROUTINE X 2)
Culture: NO GROWTH
Culture: NO GROWTH

## 2023-06-05 LAB — AEROBIC/ANAEROBIC CULTURE W GRAM STAIN (SURGICAL/DEEP WOUND): Gram Stain: NONE SEEN

## 2023-06-06 ENCOUNTER — Telehealth: Payer: Self-pay | Admitting: Internal Medicine

## 2023-06-06 ENCOUNTER — Telehealth: Payer: Self-pay

## 2023-06-06 ENCOUNTER — Ambulatory Visit (INDEPENDENT_AMBULATORY_CARE_PROVIDER_SITE_OTHER): Admitting: Podiatry

## 2023-06-06 ENCOUNTER — Ambulatory Visit (INDEPENDENT_AMBULATORY_CARE_PROVIDER_SITE_OTHER)

## 2023-06-06 DIAGNOSIS — M25571 Pain in right ankle and joints of right foot: Secondary | ICD-10-CM

## 2023-06-06 DIAGNOSIS — M86072 Acute hematogenous osteomyelitis, left ankle and foot: Secondary | ICD-10-CM | POA: Diagnosis not present

## 2023-06-06 DIAGNOSIS — M7752 Other enthesopathy of left foot: Secondary | ICD-10-CM | POA: Diagnosis not present

## 2023-06-06 DIAGNOSIS — M7751 Other enthesopathy of right foot: Secondary | ICD-10-CM | POA: Diagnosis not present

## 2023-06-06 MED ORDER — AMOXICILLIN-POT CLAVULANATE 875-125 MG PO TABS: 1.0000 | ORAL_TABLET | Freq: Two times a day (BID) | ORAL | 0 refills | Status: DC

## 2023-06-06 NOTE — Telephone Encounter (Signed)
 Per pt's MyChart message:  Please apologize to Katie Booker for canceling, I had toe amputation last week, I'm adjusting to this, I've got a home helper starting Tuesday, things are hard right now. Katie Booker, I know I haven't gotten a copy of this testing to you yet. [name]

## 2023-06-06 NOTE — Transitions of Care (Post Inpatient/ED Visit) (Signed)
   06/06/2023  Name: Katie Booker MRN: [id number] DOB: 1945-09-04  Today's TOC FU Call Status:   Patient's Name and Date of Birth confirmed.  Transition Care Management Follow-up Telephone Call:  Placed call to patient to follow up on home health and MD appointment on Friday. Patient reports that she saw [facility] on Friday and has a follow up scheduled in an hour.   Plan: will touch base with patient again tomorrow.  Orpha Blade, RN, BSN, CEN [employer] [facility]- Transition of Care Team.  [facility] [phone]     I

## 2023-06-07 ENCOUNTER — Telehealth: Payer: Self-pay

## 2023-06-07 NOTE — Patient Outreach (Signed)
 Care Management  Transitions of Care Program Transitions of Care Post-discharge week 2  06/07/2023 Name: Katie Booker MRN: [id number] DOB: 30-Sep-1945  Subjective: Katie Booker is a 78 y.o. year old female who is a primary care patient of Zilphia Hilt, Charyl Coppersmith, MD. The Care Management team was unable to reach the patient by phone to assess and address transitions of care needs.   Plan: Additional outreach attempts will be made to reach the patient enrolled in the [facility] Program (Post Inpatient/ED Visit).  Orpha Blade, RN, BSN, CEN [employer]- Transition of Care Team.  [facility] [phone]

## 2023-06-08 ENCOUNTER — Telehealth: Payer: Self-pay

## 2023-06-08 ENCOUNTER — Other Ambulatory Visit

## 2023-06-08 NOTE — Patient Outreach (Signed)
 Care Management  Transitions of Care Program Transitions of Care Post-discharge follow up   06/08/2023 Name: Katie Booker MRN: [id number] DOB: [birth year]  Subjective: Katie Booker is a 78 y.o. year old female who is a primary care patient of Zilphia Hilt, Charyl Coppersmith, MD. The Care Management team was unable to reach the patient by phone to assess and address transitions of care needs.   Plan: No further outreach attempts will be made at this time.  We have been unable to reach the patient.  Orpha Blade, RN, BSN, CEN [employer]- Transition of Care Team.  [facility] [phone]

## 2023-06-10 DIAGNOSIS — E890 Postprocedural hypothyroidism: Secondary | ICD-10-CM | POA: Diagnosis not present

## 2023-06-10 DIAGNOSIS — E785 Hyperlipidemia, unspecified: Secondary | ICD-10-CM | POA: Diagnosis not present

## 2023-06-10 DIAGNOSIS — I129 Hypertensive chronic kidney disease with stage 1 through stage 4 chronic kidney disease, or unspecified chronic kidney disease: Secondary | ICD-10-CM | POA: Diagnosis not present

## 2023-06-10 DIAGNOSIS — E1122 Type 2 diabetes mellitus with diabetic chronic kidney disease: Secondary | ICD-10-CM | POA: Diagnosis not present

## 2023-06-10 DIAGNOSIS — D631 Anemia in chronic kidney disease: Secondary | ICD-10-CM | POA: Diagnosis not present

## 2023-06-10 DIAGNOSIS — G8929 Other chronic pain: Secondary | ICD-10-CM | POA: Diagnosis not present

## 2023-06-10 DIAGNOSIS — Z4781 Encounter for orthopedic aftercare following surgical amputation: Secondary | ICD-10-CM | POA: Diagnosis not present

## 2023-06-10 DIAGNOSIS — D509 Iron deficiency anemia, unspecified: Secondary | ICD-10-CM | POA: Diagnosis not present

## 2023-06-10 DIAGNOSIS — G2581 Restless legs syndrome: Secondary | ICD-10-CM | POA: Diagnosis not present

## 2023-06-10 DIAGNOSIS — K219 Gastro-esophageal reflux disease without esophagitis: Secondary | ICD-10-CM | POA: Diagnosis not present

## 2023-06-10 DIAGNOSIS — M4316 Spondylolisthesis, lumbar region: Secondary | ICD-10-CM | POA: Diagnosis not present

## 2023-06-10 DIAGNOSIS — G4733 Obstructive sleep apnea (adult) (pediatric): Secondary | ICD-10-CM | POA: Diagnosis not present

## 2023-06-10 DIAGNOSIS — N1832 Chronic kidney disease, stage 3b: Secondary | ICD-10-CM | POA: Diagnosis not present

## 2023-06-10 DIAGNOSIS — N179 Acute kidney failure, unspecified: Secondary | ICD-10-CM | POA: Diagnosis not present

## 2023-06-10 DIAGNOSIS — M542 Cervicalgia: Secondary | ICD-10-CM | POA: Diagnosis not present

## 2023-06-10 DIAGNOSIS — H353210 Exudative age-related macular degeneration, right eye, stage unspecified: Secondary | ICD-10-CM | POA: Diagnosis not present

## 2023-06-10 DIAGNOSIS — E1142 Type 2 diabetes mellitus with diabetic polyneuropathy: Secondary | ICD-10-CM | POA: Diagnosis not present

## 2023-06-10 DIAGNOSIS — Z89412 Acquired absence of left great toe: Secondary | ICD-10-CM | POA: Diagnosis not present

## 2023-06-10 DIAGNOSIS — E875 Hyperkalemia: Secondary | ICD-10-CM | POA: Diagnosis not present

## 2023-06-10 DIAGNOSIS — K429 Umbilical hernia without obstruction or gangrene: Secondary | ICD-10-CM | POA: Diagnosis not present

## 2023-06-10 DIAGNOSIS — G4700 Insomnia, unspecified: Secondary | ICD-10-CM | POA: Diagnosis not present

## 2023-06-10 NOTE — Progress Notes (Signed)
 Subjective: Chief Complaint  Patient presents with   Routine Post Op    RM#13 post op care #1 dsg change ( per Dr. Rosemarie Conquest) Patient doing well incision looks good some redness no signs of infection.   78 year old female presents the office today for follow evaluation of underlying hallux amputation left foot.  She states that she is doing well.  Swelling and redness that she was having has improved.  She has secondary concerns of pain to the right lower extremity.  She reports a fall about 1 month ago.  She has bruising to the back of her leg but she also states on her hip as well.  She did not seek any treatment for this.  Objective: AAO x3, NAD DP/PT pulses palpable bilaterally, CRT less than 3 seconds Left:  incision well coapted with sutures intact.  There is still some localized edema and erythema but appears to be improved compared to what it was previously and she also reports improvement.  There is no drainage or pus. Right: There is ecchymosis present to the posterior aspect of the leg along the Achilles tendon, calf.  Clinically the tendon appears to be intact there is no deficit noted.  She gets mild discomfort but she is able to walk on it.  There is no specific area pinpoint tenderness. No pain with calf compression, swelling, warmth, erythema  Assessment: Status post left hallux amputation; Right ankle contusion  Plan: -All treatment options discussed with the patient including all alternatives, risks, complications.  -X-rays obtained reviewed bilaterally.  3 views of the left foot and 3 views of the right ankle were obtained.  The left foot status post hallux amputation.  No definitive cortical changes to suggest osteomyelitis.  No soft tissue emphysema.  On the right ankle there is no evidence of acute fracture.  Calcaneal spurring is present. -Left: Incisions healing well.  Moderate continue antibiotics which were refilled today.  Continue surgical shoe for offloading,  limited weightbearing.  Elevation. Monitor for any clinical signs or symptoms of infection and directed to call the office immediately should any occur or go to the ER. -Right: X-rays were obtained which did not reveal any fractures at this time and Achilles tendon appears to be intact.  Continue with supportive shoe gear with ice, elevation.  I recommend her to see her primary care doctor for her hip injury, bruising. She is able to walk on the right lower extremity.  If symptoms persist repeat x-ray or  advanced imaging. -Patient encouraged to call the office with any questions, concerns, change in symptoms.   Charity Conch DPM

## 2023-06-13 ENCOUNTER — Telehealth: Payer: Self-pay

## 2023-06-13 NOTE — Telephone Encounter (Signed)
 Copied from CRM [id number]. Topic: Clinical - Home Health Verbal Orders >> Jun 13, 2023  5:00 PM Felizardo Hotter wrote: Caller/Agency: [facility] per Evalina Hilding Number: [phone] secure line Service Requested: Physical Therapy Frequency: 1 week 1, 2 week 2, 1 week 3 request Social Worker for [employer] resources Any new concerns about the patient? No

## 2023-06-14 ENCOUNTER — Ambulatory Visit (INDEPENDENT_AMBULATORY_CARE_PROVIDER_SITE_OTHER): Admitting: Podiatry

## 2023-06-14 DIAGNOSIS — M86172 Other acute osteomyelitis, left ankle and foot: Secondary | ICD-10-CM

## 2023-06-14 DIAGNOSIS — Z9889 Other specified postprocedural states: Secondary | ICD-10-CM

## 2023-06-14 NOTE — Telephone Encounter (Signed)
 Left detailed message on machine for Katie Booker with [facility] with verbal orders.

## 2023-06-14 NOTE — Progress Notes (Signed)
  Subjective:  Patient ID: Katie Booker, female    DOB: [birth year],  MRN: [id number]  Chief Complaint  Patient presents with   Routine Post Op    RM22: Acute hematogenous osteomyelitis of left foot Suture removal , looks good     DOS: 05/31/2023  Procedure: Amputation of great toe at MPJ level, left foot   78 y.o. female seen for post op check.   She is now approximately 2 weeks status post above procedure.  Here for suture removal.  She is doing well she is walking in a postop shoe with a walker no concerns.  Review of Systems: Negative except as noted in the HPI. Denies N/V/F/Ch.   Objective:   There were no vitals filed for this visit.  There is no height or weight on file to calculate BMI. Constitutional Well developed. Well nourished.  Vascular Foot warm and well perfused. Capillary refill normal to all digits.   No calf pain with palpation  Neurologic Normal speech. Oriented to person, place, and time. Epicritic sensation diminished left foot  Dermatologic Amputation site healing very well no dehiscence no open wound.  Mild edema   Orthopedic: Status post left hallux amputation MPJ level   Radiographs: Disarticulation of left great toe at MPJ level  Pathology:  A. TOE, LEFT GREAT, AMPUTATION:  -  Skin and soft tissue with ulceration granulation tissue and evidence of abscess formation with underlying bone with acute osteomyelitis.  -  Bony/articular margin negative for acute osteomyelitis.   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  2 weeks s/p left hallux amputation -Progressing well postoperatively amputation site healing very well no issues -XR: Expected postop changes -WB Status: Weightbearing as tolerated in postop shoe for an additional 2 weeks -Sutures: Removed in total at this  visit -Medications/ABX: s/p augmentin  -Redress foot with Steri-Strips and Xeroform gauze dressing. Dressing to remain clean dry and intact until this Saturday then okay to take off the dressing and wash the foot with warm soapy water .  Can remove Steri-Strips at that time.  Then okay to apply small amount of antibiotic ointment and 2 Band-Aids to the surgical site daily for 2 weeks. - Follow-up in 2 weeks for amp site check         [name], DPM [facility] / [facility]

## 2023-06-15 DIAGNOSIS — E1122 Type 2 diabetes mellitus with diabetic chronic kidney disease: Secondary | ICD-10-CM | POA: Diagnosis not present

## 2023-06-15 DIAGNOSIS — E875 Hyperkalemia: Secondary | ICD-10-CM | POA: Diagnosis not present

## 2023-06-15 DIAGNOSIS — G4700 Insomnia, unspecified: Secondary | ICD-10-CM | POA: Diagnosis not present

## 2023-06-15 DIAGNOSIS — N1832 Chronic kidney disease, stage 3b: Secondary | ICD-10-CM | POA: Diagnosis not present

## 2023-06-15 DIAGNOSIS — N179 Acute kidney failure, unspecified: Secondary | ICD-10-CM | POA: Diagnosis not present

## 2023-06-15 DIAGNOSIS — I129 Hypertensive chronic kidney disease with stage 1 through stage 4 chronic kidney disease, or unspecified chronic kidney disease: Secondary | ICD-10-CM | POA: Diagnosis not present

## 2023-06-15 DIAGNOSIS — H353210 Exudative age-related macular degeneration, right eye, stage unspecified: Secondary | ICD-10-CM | POA: Diagnosis not present

## 2023-06-15 DIAGNOSIS — M542 Cervicalgia: Secondary | ICD-10-CM | POA: Diagnosis not present

## 2023-06-15 DIAGNOSIS — Z89412 Acquired absence of left great toe: Secondary | ICD-10-CM | POA: Diagnosis not present

## 2023-06-15 DIAGNOSIS — E785 Hyperlipidemia, unspecified: Secondary | ICD-10-CM | POA: Diagnosis not present

## 2023-06-15 DIAGNOSIS — G2581 Restless legs syndrome: Secondary | ICD-10-CM | POA: Diagnosis not present

## 2023-06-15 DIAGNOSIS — G8929 Other chronic pain: Secondary | ICD-10-CM | POA: Diagnosis not present

## 2023-06-15 DIAGNOSIS — Z4781 Encounter for orthopedic aftercare following surgical amputation: Secondary | ICD-10-CM | POA: Diagnosis not present

## 2023-06-15 DIAGNOSIS — K429 Umbilical hernia without obstruction or gangrene: Secondary | ICD-10-CM | POA: Diagnosis not present

## 2023-06-15 DIAGNOSIS — G4733 Obstructive sleep apnea (adult) (pediatric): Secondary | ICD-10-CM | POA: Diagnosis not present

## 2023-06-15 DIAGNOSIS — M4316 Spondylolisthesis, lumbar region: Secondary | ICD-10-CM | POA: Diagnosis not present

## 2023-06-15 DIAGNOSIS — D631 Anemia in chronic kidney disease: Secondary | ICD-10-CM | POA: Diagnosis not present

## 2023-06-15 DIAGNOSIS — K219 Gastro-esophageal reflux disease without esophagitis: Secondary | ICD-10-CM | POA: Diagnosis not present

## 2023-06-15 DIAGNOSIS — E1142 Type 2 diabetes mellitus with diabetic polyneuropathy: Secondary | ICD-10-CM | POA: Diagnosis not present

## 2023-06-15 DIAGNOSIS — E890 Postprocedural hypothyroidism: Secondary | ICD-10-CM | POA: Diagnosis not present

## 2023-06-15 DIAGNOSIS — D509 Iron deficiency anemia, unspecified: Secondary | ICD-10-CM | POA: Diagnosis not present

## 2023-06-17 DIAGNOSIS — I129 Hypertensive chronic kidney disease with stage 1 through stage 4 chronic kidney disease, or unspecified chronic kidney disease: Secondary | ICD-10-CM | POA: Diagnosis not present

## 2023-06-17 DIAGNOSIS — N1832 Chronic kidney disease, stage 3b: Secondary | ICD-10-CM | POA: Diagnosis not present

## 2023-06-17 DIAGNOSIS — D509 Iron deficiency anemia, unspecified: Secondary | ICD-10-CM | POA: Diagnosis not present

## 2023-06-17 DIAGNOSIS — E890 Postprocedural hypothyroidism: Secondary | ICD-10-CM | POA: Diagnosis not present

## 2023-06-17 DIAGNOSIS — Z4781 Encounter for orthopedic aftercare following surgical amputation: Secondary | ICD-10-CM | POA: Diagnosis not present

## 2023-06-17 DIAGNOSIS — E1142 Type 2 diabetes mellitus with diabetic polyneuropathy: Secondary | ICD-10-CM | POA: Diagnosis not present

## 2023-06-17 DIAGNOSIS — E1122 Type 2 diabetes mellitus with diabetic chronic kidney disease: Secondary | ICD-10-CM | POA: Diagnosis not present

## 2023-06-17 DIAGNOSIS — N179 Acute kidney failure, unspecified: Secondary | ICD-10-CM | POA: Diagnosis not present

## 2023-06-17 DIAGNOSIS — H353210 Exudative age-related macular degeneration, right eye, stage unspecified: Secondary | ICD-10-CM | POA: Diagnosis not present

## 2023-06-17 DIAGNOSIS — D631 Anemia in chronic kidney disease: Secondary | ICD-10-CM | POA: Diagnosis not present

## 2023-06-17 DIAGNOSIS — E785 Hyperlipidemia, unspecified: Secondary | ICD-10-CM | POA: Diagnosis not present

## 2023-06-17 DIAGNOSIS — G4733 Obstructive sleep apnea (adult) (pediatric): Secondary | ICD-10-CM | POA: Diagnosis not present

## 2023-06-17 DIAGNOSIS — K429 Umbilical hernia without obstruction or gangrene: Secondary | ICD-10-CM | POA: Diagnosis not present

## 2023-06-17 DIAGNOSIS — M542 Cervicalgia: Secondary | ICD-10-CM | POA: Diagnosis not present

## 2023-06-17 DIAGNOSIS — G2581 Restless legs syndrome: Secondary | ICD-10-CM | POA: Diagnosis not present

## 2023-06-17 DIAGNOSIS — G4700 Insomnia, unspecified: Secondary | ICD-10-CM | POA: Diagnosis not present

## 2023-06-17 DIAGNOSIS — E875 Hyperkalemia: Secondary | ICD-10-CM | POA: Diagnosis not present

## 2023-06-17 DIAGNOSIS — K219 Gastro-esophageal reflux disease without esophagitis: Secondary | ICD-10-CM | POA: Diagnosis not present

## 2023-06-17 DIAGNOSIS — Z89412 Acquired absence of left great toe: Secondary | ICD-10-CM | POA: Diagnosis not present

## 2023-06-17 DIAGNOSIS — G8929 Other chronic pain: Secondary | ICD-10-CM | POA: Diagnosis not present

## 2023-06-17 DIAGNOSIS — M4316 Spondylolisthesis, lumbar region: Secondary | ICD-10-CM | POA: Diagnosis not present

## 2023-06-22 DIAGNOSIS — G4700 Insomnia, unspecified: Secondary | ICD-10-CM | POA: Diagnosis not present

## 2023-06-22 DIAGNOSIS — E1142 Type 2 diabetes mellitus with diabetic polyneuropathy: Secondary | ICD-10-CM | POA: Diagnosis not present

## 2023-06-22 DIAGNOSIS — Z89412 Acquired absence of left great toe: Secondary | ICD-10-CM | POA: Diagnosis not present

## 2023-06-22 DIAGNOSIS — D631 Anemia in chronic kidney disease: Secondary | ICD-10-CM | POA: Diagnosis not present

## 2023-06-22 DIAGNOSIS — G2581 Restless legs syndrome: Secondary | ICD-10-CM | POA: Diagnosis not present

## 2023-06-22 DIAGNOSIS — N179 Acute kidney failure, unspecified: Secondary | ICD-10-CM | POA: Diagnosis not present

## 2023-06-22 DIAGNOSIS — D509 Iron deficiency anemia, unspecified: Secondary | ICD-10-CM | POA: Diagnosis not present

## 2023-06-22 DIAGNOSIS — N1832 Chronic kidney disease, stage 3b: Secondary | ICD-10-CM | POA: Diagnosis not present

## 2023-06-22 DIAGNOSIS — H353210 Exudative age-related macular degeneration, right eye, stage unspecified: Secondary | ICD-10-CM | POA: Diagnosis not present

## 2023-06-22 DIAGNOSIS — M542 Cervicalgia: Secondary | ICD-10-CM | POA: Diagnosis not present

## 2023-06-22 DIAGNOSIS — K429 Umbilical hernia without obstruction or gangrene: Secondary | ICD-10-CM | POA: Diagnosis not present

## 2023-06-22 DIAGNOSIS — G4733 Obstructive sleep apnea (adult) (pediatric): Secondary | ICD-10-CM | POA: Diagnosis not present

## 2023-06-22 DIAGNOSIS — E890 Postprocedural hypothyroidism: Secondary | ICD-10-CM | POA: Diagnosis not present

## 2023-06-22 DIAGNOSIS — E785 Hyperlipidemia, unspecified: Secondary | ICD-10-CM | POA: Diagnosis not present

## 2023-06-22 DIAGNOSIS — K219 Gastro-esophageal reflux disease without esophagitis: Secondary | ICD-10-CM | POA: Diagnosis not present

## 2023-06-22 DIAGNOSIS — I129 Hypertensive chronic kidney disease with stage 1 through stage 4 chronic kidney disease, or unspecified chronic kidney disease: Secondary | ICD-10-CM | POA: Diagnosis not present

## 2023-06-22 DIAGNOSIS — M4316 Spondylolisthesis, lumbar region: Secondary | ICD-10-CM | POA: Diagnosis not present

## 2023-06-22 DIAGNOSIS — E1122 Type 2 diabetes mellitus with diabetic chronic kidney disease: Secondary | ICD-10-CM | POA: Diagnosis not present

## 2023-06-22 DIAGNOSIS — Z4781 Encounter for orthopedic aftercare following surgical amputation: Secondary | ICD-10-CM | POA: Diagnosis not present

## 2023-06-22 DIAGNOSIS — G8929 Other chronic pain: Secondary | ICD-10-CM | POA: Diagnosis not present

## 2023-06-22 DIAGNOSIS — E875 Hyperkalemia: Secondary | ICD-10-CM | POA: Diagnosis not present

## 2023-06-28 ENCOUNTER — Ambulatory Visit (INDEPENDENT_AMBULATORY_CARE_PROVIDER_SITE_OTHER): Admitting: Podiatry

## 2023-06-28 DIAGNOSIS — S98112A Complete traumatic amputation of left great toe, initial encounter: Secondary | ICD-10-CM | POA: Diagnosis not present

## 2023-06-28 DIAGNOSIS — M869 Osteomyelitis, unspecified: Secondary | ICD-10-CM | POA: Diagnosis not present

## 2023-06-28 NOTE — Progress Notes (Signed)
  Subjective:  Patient ID: Katie Booker, female    DOB: 1945/11/09,  MRN: [id number]  Chief Complaint  Patient presents with   Post-op Follow-up    Left foot osteomyelitis. Doing well since surgery. Small area still open. Denies signs of infection. No pain. Pam, her good friend, is with her today.     DOS: 05/31/2023  Procedure: Amputation of great toe at MPJ level, left foot   78 y.o. female seen for post op check.   She is now approximately 4 weeks status post above procedure.   She reports she is doing well thinks there may be a small area open on the incision line overall though continuing to heal well.  Walking in postop shoe.  Review of Systems: Negative except as noted in the HPI. Denies N/V/F/Ch.   Objective:   There were no vitals filed for this visit.  There is no height or weight on file to calculate BMI. Constitutional Well developed. Well nourished.  Vascular Foot warm and well perfused. Capillary refill normal to all digits.   No calf pain with palpation  Neurologic Normal speech. Oriented to person, place, and time. Epicritic sensation diminished left foot  Dermatologic Amputation site nearly fully healed.  There is very small superficial breakdown in the skin at the lateral aspect of the amputation line however this is very superficial and appears to be healing in at this time.   Orthopedic: Status post left hallux amputation MPJ level   Radiographs: Disarticulation of left great toe at MPJ level  Pathology:  A. TOE, LEFT GREAT, AMPUTATION:  -  Skin and soft tissue with ulceration granulation tissue and evidence of abscess formation with underlying bone with acute osteomyelitis.  -  Bony/articular margin negative for acute osteomyelitis.   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  4  weeks s/p left hallux amputation -Progressing well postoperatively amputation site nearly fully healed at this time -XR: Expected postop changes -WB Status: Weightbearing as tolerated in postop shoe for an additional 1 week and then may progress back to regular shoe gear  -Sutures: Previously removed -Medications/ABX: s/p augmentin , no further ABX required - Okay to continue with small amount of antibiotic ointment and a Band-Aid over the lateral aspect of the amputation line until fully healed - Recommend follow-up appointment with our pedorthist Trish for evaluation for specialty liner with toe filler for hallux amp site. - Patient will follow-up as needed if there is any issues with the amputation site going forward.         [name], DPM [facility] / [facility]

## 2023-06-29 DIAGNOSIS — H353210 Exudative age-related macular degeneration, right eye, stage unspecified: Secondary | ICD-10-CM | POA: Diagnosis not present

## 2023-06-29 DIAGNOSIS — E1142 Type 2 diabetes mellitus with diabetic polyneuropathy: Secondary | ICD-10-CM | POA: Diagnosis not present

## 2023-06-29 DIAGNOSIS — K219 Gastro-esophageal reflux disease without esophagitis: Secondary | ICD-10-CM | POA: Diagnosis not present

## 2023-06-29 DIAGNOSIS — N179 Acute kidney failure, unspecified: Secondary | ICD-10-CM | POA: Diagnosis not present

## 2023-06-29 DIAGNOSIS — G2581 Restless legs syndrome: Secondary | ICD-10-CM | POA: Diagnosis not present

## 2023-06-29 DIAGNOSIS — E890 Postprocedural hypothyroidism: Secondary | ICD-10-CM | POA: Diagnosis not present

## 2023-06-29 DIAGNOSIS — E785 Hyperlipidemia, unspecified: Secondary | ICD-10-CM | POA: Diagnosis not present

## 2023-06-29 DIAGNOSIS — G4733 Obstructive sleep apnea (adult) (pediatric): Secondary | ICD-10-CM | POA: Diagnosis not present

## 2023-06-29 DIAGNOSIS — N1832 Chronic kidney disease, stage 3b: Secondary | ICD-10-CM | POA: Diagnosis not present

## 2023-06-29 DIAGNOSIS — K429 Umbilical hernia without obstruction or gangrene: Secondary | ICD-10-CM | POA: Diagnosis not present

## 2023-06-29 DIAGNOSIS — M4316 Spondylolisthesis, lumbar region: Secondary | ICD-10-CM | POA: Diagnosis not present

## 2023-06-29 DIAGNOSIS — E875 Hyperkalemia: Secondary | ICD-10-CM | POA: Diagnosis not present

## 2023-06-29 DIAGNOSIS — D509 Iron deficiency anemia, unspecified: Secondary | ICD-10-CM | POA: Diagnosis not present

## 2023-06-29 DIAGNOSIS — D631 Anemia in chronic kidney disease: Secondary | ICD-10-CM | POA: Diagnosis not present

## 2023-06-29 DIAGNOSIS — G4700 Insomnia, unspecified: Secondary | ICD-10-CM | POA: Diagnosis not present

## 2023-06-29 DIAGNOSIS — E1122 Type 2 diabetes mellitus with diabetic chronic kidney disease: Secondary | ICD-10-CM | POA: Diagnosis not present

## 2023-06-29 DIAGNOSIS — M542 Cervicalgia: Secondary | ICD-10-CM | POA: Diagnosis not present

## 2023-06-29 DIAGNOSIS — Z4781 Encounter for orthopedic aftercare following surgical amputation: Secondary | ICD-10-CM | POA: Diagnosis not present

## 2023-06-29 DIAGNOSIS — I129 Hypertensive chronic kidney disease with stage 1 through stage 4 chronic kidney disease, or unspecified chronic kidney disease: Secondary | ICD-10-CM | POA: Diagnosis not present

## 2023-06-29 DIAGNOSIS — Z89412 Acquired absence of left great toe: Secondary | ICD-10-CM | POA: Diagnosis not present

## 2023-06-29 DIAGNOSIS — G8929 Other chronic pain: Secondary | ICD-10-CM | POA: Diagnosis not present

## 2023-06-30 ENCOUNTER — Encounter: Payer: Self-pay | Admitting: Internal Medicine

## 2023-07-04 ENCOUNTER — Encounter (HOSPITAL_COMMUNITY): Payer: Self-pay

## 2023-07-06 ENCOUNTER — Other Ambulatory Visit: Payer: Self-pay | Admitting: Internal Medicine

## 2023-07-06 DIAGNOSIS — G2581 Restless legs syndrome: Secondary | ICD-10-CM | POA: Diagnosis not present

## 2023-07-06 DIAGNOSIS — Z4781 Encounter for orthopedic aftercare following surgical amputation: Secondary | ICD-10-CM | POA: Diagnosis not present

## 2023-07-06 DIAGNOSIS — G4733 Obstructive sleep apnea (adult) (pediatric): Secondary | ICD-10-CM | POA: Diagnosis not present

## 2023-07-06 DIAGNOSIS — G4700 Insomnia, unspecified: Secondary | ICD-10-CM | POA: Diagnosis not present

## 2023-07-06 DIAGNOSIS — N179 Acute kidney failure, unspecified: Secondary | ICD-10-CM | POA: Diagnosis not present

## 2023-07-06 DIAGNOSIS — D631 Anemia in chronic kidney disease: Secondary | ICD-10-CM | POA: Diagnosis not present

## 2023-07-06 DIAGNOSIS — E785 Hyperlipidemia, unspecified: Secondary | ICD-10-CM | POA: Diagnosis not present

## 2023-07-06 DIAGNOSIS — Z89412 Acquired absence of left great toe: Secondary | ICD-10-CM | POA: Diagnosis not present

## 2023-07-06 DIAGNOSIS — K429 Umbilical hernia without obstruction or gangrene: Secondary | ICD-10-CM | POA: Diagnosis not present

## 2023-07-06 DIAGNOSIS — N1832 Chronic kidney disease, stage 3b: Secondary | ICD-10-CM | POA: Diagnosis not present

## 2023-07-06 DIAGNOSIS — M542 Cervicalgia: Secondary | ICD-10-CM | POA: Diagnosis not present

## 2023-07-06 DIAGNOSIS — H353210 Exudative age-related macular degeneration, right eye, stage unspecified: Secondary | ICD-10-CM | POA: Diagnosis not present

## 2023-07-06 DIAGNOSIS — E1142 Type 2 diabetes mellitus with diabetic polyneuropathy: Secondary | ICD-10-CM | POA: Diagnosis not present

## 2023-07-06 DIAGNOSIS — E1122 Type 2 diabetes mellitus with diabetic chronic kidney disease: Secondary | ICD-10-CM | POA: Diagnosis not present

## 2023-07-06 DIAGNOSIS — M4316 Spondylolisthesis, lumbar region: Secondary | ICD-10-CM | POA: Diagnosis not present

## 2023-07-06 DIAGNOSIS — E875 Hyperkalemia: Secondary | ICD-10-CM | POA: Diagnosis not present

## 2023-07-06 DIAGNOSIS — I129 Hypertensive chronic kidney disease with stage 1 through stage 4 chronic kidney disease, or unspecified chronic kidney disease: Secondary | ICD-10-CM | POA: Diagnosis not present

## 2023-07-06 DIAGNOSIS — D509 Iron deficiency anemia, unspecified: Secondary | ICD-10-CM | POA: Diagnosis not present

## 2023-07-06 DIAGNOSIS — E890 Postprocedural hypothyroidism: Secondary | ICD-10-CM | POA: Diagnosis not present

## 2023-07-06 DIAGNOSIS — K219 Gastro-esophageal reflux disease without esophagitis: Secondary | ICD-10-CM | POA: Diagnosis not present

## 2023-07-06 DIAGNOSIS — G8929 Other chronic pain: Secondary | ICD-10-CM | POA: Diagnosis not present

## 2023-07-08 DIAGNOSIS — H43813 Vitreous degeneration, bilateral: Secondary | ICD-10-CM | POA: Diagnosis not present

## 2023-07-08 DIAGNOSIS — H35372 Puckering of macula, left eye: Secondary | ICD-10-CM | POA: Diagnosis not present

## 2023-07-08 DIAGNOSIS — H35433 Paving stone degeneration of retina, bilateral: Secondary | ICD-10-CM | POA: Diagnosis not present

## 2023-07-08 DIAGNOSIS — H353211 Exudative age-related macular degeneration, right eye, with active choroidal neovascularization: Secondary | ICD-10-CM | POA: Diagnosis not present

## 2023-07-08 DIAGNOSIS — E119 Type 2 diabetes mellitus without complications: Secondary | ICD-10-CM | POA: Diagnosis not present

## 2023-07-08 DIAGNOSIS — H353123 Nonexudative age-related macular degeneration, left eye, advanced atrophic without subfoveal involvement: Secondary | ICD-10-CM | POA: Diagnosis not present

## 2023-07-11 ENCOUNTER — Other Ambulatory Visit (INDEPENDENT_AMBULATORY_CARE_PROVIDER_SITE_OTHER)

## 2023-07-11 ENCOUNTER — Encounter: Payer: Self-pay | Admitting: Internal Medicine

## 2023-07-11 ENCOUNTER — Ambulatory Visit: Admitting: Internal Medicine

## 2023-07-11 VITALS — BP 130/66 | HR 74 | Temp 98.2°F | Wt 182.3 lb

## 2023-07-11 DIAGNOSIS — R600 Localized edema: Secondary | ICD-10-CM

## 2023-07-11 LAB — COMPREHENSIVE METABOLIC PANEL WITH GFR
ALT: 20 U/L (ref 0–35)
AST: 22 U/L (ref 0–37)
Albumin: 4 g/dL (ref 3.5–5.2)
Alkaline Phosphatase: 78 U/L (ref 39–117)
BUN: 20 mg/dL (ref 6–23)
CO2: 25 meq/L (ref 19–32)
Calcium: 9 mg/dL (ref 8.4–10.5)
Chloride: 101 meq/L (ref 96–112)
Creatinine, Ser: 1.12 mg/dL (ref 0.40–1.20)
GFR: 47.48 mL/min — ABNORMAL LOW (ref >60.00)
Glucose, Bld: 97 mg/dL (ref 70–99)
Potassium: 4.3 meq/L (ref 3.5–5.1)
Sodium: 138 meq/L (ref 135–145)
Total Bilirubin: 0.3 mg/dL (ref 0.2–1.2)
Total Protein: 6.5 g/dL (ref 6.0–8.3)

## 2023-07-11 LAB — CBC WITH DIFFERENTIAL/PLATELET
Basophils Absolute: 0 10*3/uL (ref 0.0–0.1)
Basophils Relative: 0.4 % (ref 0.0–3.0)
Eosinophils Absolute: 0.1 10*3/uL (ref 0.0–0.7)
Eosinophils Relative: 1.3 % (ref 0.0–5.0)
HCT: 33.8 % — ABNORMAL LOW (ref 36.0–46.0)
Hemoglobin: 11.3 g/dL — ABNORMAL LOW (ref 12.0–15.0)
Lymphocytes Relative: 15.2 % (ref 12.0–46.0)
Lymphs Abs: 1.2 10*3/uL (ref 0.7–4.0)
MCHC: 33.4 g/dL (ref 30.0–36.0)
MCV: 84.4 fl (ref 78.0–100.0)
Monocytes Absolute: 0.4 10*3/uL (ref 0.1–1.0)
Monocytes Relative: 4.5 % (ref 3.0–12.0)
Neutro Abs: 6.4 10*3/uL (ref 1.4–7.7)
Neutrophils Relative %: 78.6 % — ABNORMAL HIGH (ref 43.0–77.0)
Platelets: 227 10*3/uL (ref 150.0–400.0)
RBC: 4 Mil/uL (ref 3.87–5.11)
RDW: 15.4 % (ref 11.5–15.5)
WBC: 8.1 10*3/uL (ref 4.0–10.5)

## 2023-07-11 MED ORDER — FUROSEMIDE 20 MG PO TABS: 20.0000 mg | ORAL_TABLET | Freq: Every day | ORAL | 0 refills | Status: DC

## 2023-07-11 NOTE — Progress Notes (Signed)
 Established Patient Office Visit     CC/Reason for Visit: Bilateral lower extremity swelling  HPI: Katie Booker is a 78 y.o. female who is coming in today for the above mentioned reasons.  She had amputation of her left great toe due to osteomyelitis by podiatry.  Has been doing well.  Has been more sedentary than usual.  Over the last couple weeks has noted bilateral lower extremity edema.  It is pitting.  Denies chest pain or shortness of breath, dyspnea on exertion.   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   Depression    DOE (dyspnea on exertion)    GERD    Headache    Hemorrhoids    History of basal cell carcinoma excision    left cheek   History of colon polyps    History of hyperthyroidism 2011   RAI treatement   History of panic attacks    Hyperlipidemia    Hypertension    Hypothyroidism, postradioiodine therapy    followed by pcp   IDA (iron deficiency anemia)    intermittant   Insomnia    OA (osteoarthritis)    OSA (obstructive sleep apnea)    no cpap, per pt tried unable to tolerate   Peripheral neuropathy    legs and feet from hx back surgery's   Rash    bilateral axilla area -- per pt due to some personal wipes used other the counter   RLS (restless legs syndrome)    Spondylolisthesis at L1-L2 level    Tingling of both upper extremities    left greater than right due to cervical pinched nerve   Type 2 diabetes mellitus (HCC)    followed by pcp   Umbilical hernia    Urinary frequency    Urinary urgency     Past Surgical History:  Procedure Laterality Date   AMPUTATION TOE Left 05/31/2023   Procedure: LEFT GREAT TOE AMPUTATION;  Surgeon:  Evertt Hoe, DPM;  Location: [facility] OR;  Service: Orthopedics/Podiatry;  Laterality: Left;   ANTERIOR CERVICAL DECOMP/DISCECTOMY FUSION N/A 09/02/2014   Procedure: Cervical five- six  Anterior cervical decompression fusion;  Surgeon: Elna Haggis, MD;  Location: [facility];  Service: Neurosurgery;  Laterality: N/A;  C5-6 Anterior cervical decompression/diskectomy/fusion   BREAST BIOPSY Right 10/2018   x 2 benign   CARPAL TUNNEL RELEASE Right 2016   CATARACT EXTRACTION W/ INTRAOCULAR LENS  IMPLANT, BILATERAL  03/2015   COLONOSCOPY  2007   ESOPHAGOGASTRODUODENOSCOPY  2007   HEMORRHOID SURGERY  03/23/2011   Procedure: HEMORRHOIDECTOMY;  Surgeon: Brandy Cal. Cornett, MD;  Location: [facility];  Service: General;  Laterality: N/A;  lateral internal sphincterotomy and hemorrhoidectomy   LUMBAR FUSION  02/2015   L1 -- 2   PARTIAL KNEE ARTHROPLASTY Left 12/27/2017   Procedure: UNICOMPARTMENTAL LEFT KNEE;  Surgeon: Osa Blase, MD;  Location: [facility];  Service: Orthopedics;  Laterality: Left;   THORACIC DISCECTOMY N/A 12/15/2012   Procedure: Thoracic ten-eleven Thoracic laminectomy;  Surgeon: Elna Haggis, MD;  Location: [facility];  Service: Neurosurgery;  Laterality: N/A;  Thoracic ten-eleven Thoracic laminectomy  TUBAL LIGATION Bilateral 1991    Social History:  reports that she quit smoking about 19 years ago. Her smoking use included cigarettes. She started smoking about 39 years ago. She has a 20 pack-year smoking history. She has never used smokeless tobacco. She reports that she does not currently use alcohol . She reports that she does not use drugs.  Allergies: Allergies  Allergen Reactions   Duraprep [Antiseptic Products, Misc.] Itching and Rash    Irritation everywhere prep was used   Advil Allergy Sinus [Chlorpheniramine-Pse-Ibuprofen]     Nervousness    Betadine [Povidone Iodine] Other (See Comments)    Burning sensation.    Hydromorphone  Hcl Other (See  Comments)    Makes crazy   Morphine  And Codeine Other (See Comments)    hallucinations    Family History:  Family History  Problem Relation Age of Onset   Heart failure Mother    Diabetes Father    COPD Father    Cancer Maternal Grandfather        stomach     Current Outpatient Medications:    atenolol  (TENORMIN ) 25 MG tablet, TAKE 1 TABLET BY MOUTH DAILY, Disp: 100 tablet, Rfl: 2   atorvastatin  (LIPITOR) 40 MG tablet, TAKE 1 TABLET BY MOUTH DAILY, Disp: 100 tablet, Rfl: 2   calcium  citrate (CALCITRATE - DOSED IN MG ELEMENTAL CALCIUM ) 950 (200 Ca) MG tablet, Take 200 mg of elemental calcium  by mouth daily., Disp: , Rfl:    Carboxymethylcellulose Sodium (LUBRICANT EYE DROPS OP), Apply 2 drops to eye 4 (four) times daily as needed (dry eyes). , Disp: , Rfl:    estradiol  (ESTRACE ) 0.1 MG/GM vaginal cream, Place 0.5 g vaginally 2 (two) times a week. Place 0.5g nightly for two weeks then twice a week after, Disp: 42.5 g, Rfl: 11   furosemide  (LASIX ) 20 MG tablet, Take 1 tablet (20 mg total) by mouth daily for 5 days., Disp: 5 tablet, Rfl: 0   HYDROcodone -acetaminophen  (NORCO) 7.5-325 MG tablet, Take 1 tablet by mouth every 6 (six) hours as needed for moderate pain (pain score 4-6)., Disp: , Rfl:    hydrocortisone  2.5 % cream, APPLY TO AFFECTED AREA(S)  TOPICALLY TWICE DAILY, Disp: 113.4 g, Rfl: 0   ibuprofen (ADVIL) 200 MG tablet, Take 200 mg by mouth daily. 1-2 tablets daily, Disp: , Rfl:    Lancets (ONETOUCH ULTRASOFT) lancets, Use daily, Disp: 100 each, Rfl: 6   metFORMIN  (GLUCOPHAGE ) 1000 MG tablet, TAKE 1 TABLET BY MOUTH TWICE  DAILY WITH MEALS, Disp: 200 tablet, Rfl: 1   Multiple Vitamin (MULTIVITAMIN) tablet, Take 1 tablet by mouth daily., Disp: , Rfl:    ONE TOUCH ULTRA TEST test strip, USE 1 DAILY AS DIRECTED (Patient taking differently: 1 each by Other route as needed for other.), Disp: 100 each, Rfl: 3   pantoprazole  (PROTONIX ) 40 MG tablet, TAKE 1 TABLET BY MOUTH TWICE  DAILY  BEFORE MEALS, Disp: 200 tablet, Rfl: 1   pioglitazone  (ACTOS ) 15 MG tablet, TAKE 1 TABLET BY MOUTH DAILY, Disp: 100 tablet, Rfl: 2   pramipexole  (MIRAPEX ) 0.125 MG tablet, Take 1 tablet (0.125 mg total) by mouth at bedtime., Disp: 30 tablet, Rfl: 2   pregabalin  (LYRICA ) 75 MG capsule, TAKE 1 CAPSULE BY MOUTH TWICE  DAILY, Disp: 180 capsule, Rfl: 0   tiZANidine  (ZANAFLEX ) 4 MG tablet, TAKE 1 TABLET BY MOUTH 3 TIMES  DAILY AS NEEDED, Disp: 180 tablet, Rfl: 0   tobramycin  (TOBREX ) 0.3 % ophthalmic solution, Place 1 drop into  both eyes every 6 (six) hours. Day before injection, day of injection and day after injection, Disp: , Rfl:    triamcinolone  cream (KENALOG ) 0.1 %, Apply 1 Application topically 2 (two) times daily., Disp: 80 g, Rfl: 0   vortioxetine  HBr (TRINTELLIX ) 5 MG TABS tablet, Take 10 mg by mouth daily., Disp: , Rfl:    ALPRAZolam  (XANAX ) 0.5 MG tablet, Take 1 tablet (0.5 mg total) by mouth 2 (two) times daily., Disp: 90 tablet, Rfl: 2   Carboxymethylcell-Hypromellose (GENTEAL OP), Apply to eye., Disp: , Rfl:    levothyroxine  (SYNTHROID ) 125 MCG tablet, TAKE 1 TABLET BY MOUTH DAILY, Disp: 100 tablet, Rfl: 1   meloxicam  (MOBIC ) 15 MG tablet, TAKE 1 TABLET BY MOUTH DAILY, Disp: 100 tablet, Rfl: 2   Omega-3 Fatty Acids (FISH OIL) 1200 MG CAPS, Take 1 capsule by mouth daily. (Patient not taking: Reported on 07/11/2023), Disp: , Rfl:   Review of Systems:  Negative unless indicated in HPI.   Physical Exam: Vitals:   07/11/23 1328  BP: 130/66  Pulse: 74  Temp: 98.2 F (36.8 C)  TempSrc: Oral  SpO2: 93%  Weight: 182 lb 4.8 oz (82.7 kg)    Body mass index is 35.6 kg/m.   Physical Exam Musculoskeletal:     Right lower leg: Edema present.     Left lower leg: Edema present.      Impression and Plan:  Bilateral lower extremity edema -     CBC with Differential/Platelet; Future -     Comprehensive metabolic panel with GFR; Future -     Furosemide ; Take 1 tablet (20 mg total)  by mouth daily for 5 days.  Dispense: 5 tablet; Refill: 0   - Suspect related to being more sedentary after recent surgery, unlikely DVT given it is bilateral. - Will give 5 days of furosemide  20 mg, have advised compression stockings.  Will check liver and kidney function today.  Time spent:31 minutes reviewing chart, interviewing and examining patient and formulating plan of care.     Marguerita Shih, MD [facility]

## 2023-07-12 ENCOUNTER — Ambulatory Visit: Payer: Self-pay | Admitting: Internal Medicine

## 2023-07-13 ENCOUNTER — Ambulatory Visit: Admitting: Physical Therapy

## 2023-07-13 DIAGNOSIS — E1122 Type 2 diabetes mellitus with diabetic chronic kidney disease: Secondary | ICD-10-CM | POA: Diagnosis not present

## 2023-07-13 DIAGNOSIS — Z89412 Acquired absence of left great toe: Secondary | ICD-10-CM | POA: Diagnosis not present

## 2023-07-13 DIAGNOSIS — G4700 Insomnia, unspecified: Secondary | ICD-10-CM | POA: Diagnosis not present

## 2023-07-13 DIAGNOSIS — E890 Postprocedural hypothyroidism: Secondary | ICD-10-CM | POA: Diagnosis not present

## 2023-07-13 DIAGNOSIS — E785 Hyperlipidemia, unspecified: Secondary | ICD-10-CM | POA: Diagnosis not present

## 2023-07-13 DIAGNOSIS — K429 Umbilical hernia without obstruction or gangrene: Secondary | ICD-10-CM | POA: Diagnosis not present

## 2023-07-13 DIAGNOSIS — D631 Anemia in chronic kidney disease: Secondary | ICD-10-CM | POA: Diagnosis not present

## 2023-07-13 DIAGNOSIS — N1832 Chronic kidney disease, stage 3b: Secondary | ICD-10-CM | POA: Diagnosis not present

## 2023-07-13 DIAGNOSIS — N179 Acute kidney failure, unspecified: Secondary | ICD-10-CM | POA: Diagnosis not present

## 2023-07-13 DIAGNOSIS — H353210 Exudative age-related macular degeneration, right eye, stage unspecified: Secondary | ICD-10-CM | POA: Diagnosis not present

## 2023-07-13 DIAGNOSIS — G4733 Obstructive sleep apnea (adult) (pediatric): Secondary | ICD-10-CM | POA: Diagnosis not present

## 2023-07-13 DIAGNOSIS — D509 Iron deficiency anemia, unspecified: Secondary | ICD-10-CM | POA: Diagnosis not present

## 2023-07-13 DIAGNOSIS — Z4781 Encounter for orthopedic aftercare following surgical amputation: Secondary | ICD-10-CM | POA: Diagnosis not present

## 2023-07-13 DIAGNOSIS — G2581 Restless legs syndrome: Secondary | ICD-10-CM | POA: Diagnosis not present

## 2023-07-13 DIAGNOSIS — E1142 Type 2 diabetes mellitus with diabetic polyneuropathy: Secondary | ICD-10-CM | POA: Diagnosis not present

## 2023-07-13 DIAGNOSIS — M542 Cervicalgia: Secondary | ICD-10-CM | POA: Diagnosis not present

## 2023-07-13 DIAGNOSIS — E875 Hyperkalemia: Secondary | ICD-10-CM | POA: Diagnosis not present

## 2023-07-13 DIAGNOSIS — G8929 Other chronic pain: Secondary | ICD-10-CM | POA: Diagnosis not present

## 2023-07-13 DIAGNOSIS — K219 Gastro-esophageal reflux disease without esophagitis: Secondary | ICD-10-CM | POA: Diagnosis not present

## 2023-07-13 DIAGNOSIS — I129 Hypertensive chronic kidney disease with stage 1 through stage 4 chronic kidney disease, or unspecified chronic kidney disease: Secondary | ICD-10-CM | POA: Diagnosis not present

## 2023-07-13 DIAGNOSIS — M4316 Spondylolisthesis, lumbar region: Secondary | ICD-10-CM | POA: Diagnosis not present

## 2023-07-16 ENCOUNTER — Other Ambulatory Visit: Payer: Self-pay | Admitting: Internal Medicine

## 2023-07-18 ENCOUNTER — Encounter: Payer: Self-pay | Admitting: Internal Medicine

## 2023-07-18 DIAGNOSIS — R600 Localized edema: Secondary | ICD-10-CM

## 2023-07-19 MED ORDER — FUROSEMIDE 20 MG PO TABS: 20.0000 mg | ORAL_TABLET | Freq: Every day | ORAL | 0 refills | Status: DC

## 2023-07-20 ENCOUNTER — Encounter: Payer: Self-pay | Admitting: Internal Medicine

## 2023-07-20 DIAGNOSIS — M48062 Spinal stenosis, lumbar region with neurogenic claudication: Secondary | ICD-10-CM

## 2023-07-20 MED ORDER — HYDROCODONE-ACETAMINOPHEN 7.5-325 MG PO TABS
1.0000 | ORAL_TABLET | Freq: Four times a day (QID) | ORAL | 0 refills | Status: DC | PRN
Start: 2023-07-20 — End: 2023-10-03

## 2023-07-20 MED ORDER — HYDROCODONE-ACETAMINOPHEN 7.5-325 MG PO TABS: 1.0000 | ORAL_TABLET | Freq: Four times a day (QID) | ORAL | 0 refills | Status: AC | PRN

## 2023-07-25 ENCOUNTER — Ambulatory Visit: Attending: Podiatry

## 2023-07-25 DIAGNOSIS — R262 Difficulty in walking, not elsewhere classified: Secondary | ICD-10-CM | POA: Insufficient documentation

## 2023-07-25 DIAGNOSIS — R2681 Unsteadiness on feet: Secondary | ICD-10-CM | POA: Insufficient documentation

## 2023-07-25 DIAGNOSIS — S98112A Complete traumatic amputation of left great toe, initial encounter: Secondary | ICD-10-CM | POA: Insufficient documentation

## 2023-07-25 DIAGNOSIS — M6281 Muscle weakness (generalized): Secondary | ICD-10-CM | POA: Diagnosis not present

## 2023-07-25 NOTE — Therapy (Signed)
 OUTPATIENT PHYSICAL THERAPY NEURO EVALUATION   Patient Name: Katie Booker MRN: [id number] DOB:Jun 09, 1945, 78 y.o., female Today's Date: 07/25/2023   PCP: Zilphia Hilt, Charyl Coppersmith, MD REFERRING PROVIDER: Evertt Hoe, DPM  END OF SESSION:  PT End of Session - 07/25/23 1530     Visit Number 1    Number of Visits 14    Date for PT Re-Evaluation 09/19/23    Authorization Type [employer]    Progress Note Due on Visit 10    PT Start Time 1530    PT Stop Time 1615    PT Time Calculation (min) 45 min    Behavior During Therapy Anxious             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   Depression    DOE (dyspnea on exertion)    GERD    Headache    Hemorrhoids    History of basal cell carcinoma excision    left cheek   History of colon polyps    History of hyperthyroidism 2011   RAI treatement   History of panic attacks    Hyperlipidemia    Hypertension    Hypothyroidism, postradioiodine therapy    followed by pcp   IDA (iron deficiency anemia)    intermittant   Insomnia    OA (osteoarthritis)    OSA (obstructive sleep apnea)    no cpap, per pt tried unable to tolerate   Peripheral neuropathy    legs and feet from hx back surgery's   Rash    bilateral axilla area -- per pt due to some personal wipes used other the counter   RLS (restless legs syndrome)    Spondylolisthesis at L1-L2 level    Tingling of both upper extremities    left greater than right due to cervical pinched nerve   Type 2 diabetes mellitus (HCC)    followed by pcp   Umbilical hernia    Urinary frequency    Urinary urgency    Past Surgical History:  Procedure Laterality Date   AMPUTATION TOE  Left 05/31/2023   Procedure: LEFT GREAT TOE AMPUTATION;  Surgeon: Evertt Hoe, DPM;  Location: [facility];  Service: Orthopedics/Podiatry;  Laterality: Left;   ANTERIOR CERVICAL DECOMP/DISCECTOMY FUSION N/A 09/02/2014   Procedure: Cervical five- six  Anterior cervical decompression fusion;  Surgeon: Elna Haggis, MD;  Location: [facility];  Service: Neurosurgery;  Laterality: N/A;  C5-6 Anterior cervical decompression/diskectomy/fusion   BREAST BIOPSY Right 10/2018   x 2 benign   CARPAL TUNNEL RELEASE Right 2016   CATARACT EXTRACTION W/ INTRAOCULAR LENS  IMPLANT, BILATERAL  03/2015   COLONOSCOPY  2007   ESOPHAGOGASTRODUODENOSCOPY  2007   HEMORRHOID SURGERY  03/23/2011   Procedure: HEMORRHOIDECTOMY;  Surgeon: Brandy Cal. Cornett, MD;  Location: [facility];  Service: General;  Laterality: N/A;  lateral internal sphincterotomy and hemorrhoidectomy   LUMBAR FUSION  02/2015   L1 -- 2   PARTIAL KNEE ARTHROPLASTY Left 12/27/2017   Procedure: UNICOMPARTMENTAL LEFT KNEE;  Surgeon: Osa Blase, MD;  Location: [facility];  Service: Orthopedics;  Laterality: Left;   THORACIC DISCECTOMY  N/A 12/15/2012   Procedure: Thoracic ten-eleven Thoracic laminectomy;  Surgeon: Elna Haggis, MD;  Location: [facility];  Service: Neurosurgery;  Laterality: N/A;  Thoracic ten-eleven Thoracic laminectomy   TUBAL LIGATION Bilateral 1991   Patient Active Problem List   Diagnosis Date Noted   Osteomyelitis of great toe of left foot (HCC) 05/31/2023   Diabetic foot ulcer (HCC) 05/30/2023   Complete rupture of left Achilles tendon 11/03/2021   Morbid obesity (HCC) 01/13/2018   S/P knee replacement 12/27/2017   Spondylolisthesis at L1-L2 level 03/18/2015   Spondylosis with myelopathy 09/02/2014   Cervical spondylosis with myelopathy 09/02/2014   Anxiety 12/21/2012   Chronic back pain 12/21/2012   Insomnia 12/21/2012   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, L2-5 12/15/2012   Spinal stenosis,  lumbar region, with neurogenic claudication 10/26/2012   Spinal stenosis, thoracic 10/26/2012   Umbilical hernia 02/25/2012   OSA (obstructive sleep apnea) 11/12/2011   Exogenous obesity 11/12/2011   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   Hypothyroidism 09/18/2009   Diabetes mellitus without complication (HCC) 09/18/2009   Dyslipidemia 09/18/2009   Anemia 09/18/2009   Depression 09/18/2009   Essential hypertension 09/18/2009   GERD 09/18/2009   Primary localized osteoarthritis of left knee 09/18/2009    ONSET DATE: 06/05/23  REFERRING DIAG:  U98.119J (ICD-10-CM) - Amputation of left great toe (HCC)    THERAPY DIAG:  Difficulty in walking, not elsewhere classified  Unsteadiness on feet  Muscle weakness (generalized)  Rationale for Evaluation and Treatment: Rehabilitation  SUBJECTIVE:                                                                                                                                                                                             SUBJECTIVE STATEMENT: Had previous issue of left Achilles injury over a year ago which was managed non-surgically and notes some ongoing weakness as a result.  Had onset of infection to left great toe and underwent 1st toe amputation 06/05/23. Notes her balance has been affected from this amputation. Reports she has BLE pain with standing/walking.  Has been working with Home Health PT since D/C from hospital (reports about 7-8 sessions).  Overall, reports reduced activity tolerance and mainly sedentary especially as she was recovering from surgery.   Pt accompanied by: self  PERTINENT HISTORY: Radiographs: Disarticulation of left great toe at MPJ level. WB Status: Weightbearing as tolerated in postop shoe for an additional 1 week and then may progress back to regular shoe gea  PAIN:  Are you having pain? Yes: NPRS scale: constant/chronic Pain location: all over--legs, back, neck Pain description:  pain/ache Aggravating factors: worse in the  AM Relieving factors: rest  PRECAUTIONS: Fall  RED FLAGS: None   WEIGHT BEARING RESTRICTIONS: Weightbearing has been lifted  FALLS: Has patient fallen in last 6 months? Yes. Number of falls > 10  LIVING ENVIRONMENT: Lives with: lives alone Lives in: [place], lives in [employer] on 1st floor Stairs: none Has following equipment at home: Otho Blitz - 4 wheeled and has recently started with caregiver assist on Tue/Thur for housekeeping  PLOF: Independent with household mobility with device and Needs assistance with homemaking  PATIENT GOALS: improve balance  OBJECTIVE:  Note: Objective measures were completed at Evaluation unless otherwise noted.  DIAGNOSTIC FINDINGS: Radiographs: Disarticulation of left great toe at MPJ level  COGNITION: Overall cognitive status: Within functional limits for tasks assessed   SENSATION: Not tested  COORDINATION: WFL  EDEMA:  Some pitting 5-10 sec refill BLE  MUSCLE TONE:     DTRs:  NT  POSTURE: rounded shoulders, forward head, and flexed trunk   LOWER EXTREMITY ROM:     Active  Right Eval Left Eval  Hip flexion    Hip extension    Hip abduction    Hip adduction    Hip internal rotation    Hip external rotation    Knee flexion    Knee extension    Ankle dorsiflexion 5 10  Ankle plantarflexion    Ankle inversion    Ankle eversion     (Blank rows = not tested)  LOWER EXTREMITY MMT:    BLE strength gross 4/5   BED MOBILITY:  Not tested-reports indep  TRANSFERS: Independent sit-stand Modified indep from lower seat heights requiring UE support    CURB:  Findings: CGA  STAIRS: NT GAIT: Findings: Distance walked: 200, Assistive device utilized:Walker - 4 wheeled, Level of assistance: Modified independence, and Comments: unsteadiness in turns. CGA for uneven surfaces--narrow BOS  FUNCTIONAL TESTS:  5 times sit to stand: 16 sec Timed up and go (TUG): 15 sec   : 200 ft w/ 3KG Berg Balance: TBD  M-CTSIB  Condition 1: Firm Surface, EO 30 Sec, Mild and Moderate Sway  Condition 2: Firm Surface, EC 1 Sec, Moderate, Severe, and forward LOB Sway  Condition 3: Foam Surface, EO  Sec,  Sway  Condition 4: Foam Surface, EC  Sec,  Sway                                                                                                                                  TREATMENT DATE:     PATIENT EDUCATION: Education details: assessment details, rationale of PT intervention.  Recommendation of 2x/wk tx and pt requests 1x/wk Person educated: Patient Education method: Explanation Education comprehension: verbalized understanding  HOME EXERCISE PROGRAM: TBD  GOALS: Goals reviewed with patient? Yes  SHORT TERM GOALS: Target date: 08/15/2023    Patient will be independent in HEP to improve functional outcomes Baseline: Goal status: INITIAL  2.  Demo improved BLE strength and reduced  risk for falls per time < 15 sec 5xSTS test Baseline: 16 sec Goal status: INITIAL  3.  Improved balance and reduce risk for falls per time 12 sec TUG test Baseline: 15 sec w/ 6NG Goal status: INITIAL    LONG TERM GOALS: Target date: 09/19/2023    Demo improved balance and low risk for falls per score 45/56 Berg Balance Test Baseline: TBD Goal status: INITIAL  2.  Demo improved independence and safety with ambulation modified independent various surfaces to improve safety with community mobility Baseline: SBA-CGA Goal status: INITIAL  3.  Demo improved gait speed/endurance by distance of 250 ft during Baseline: 200 ft w/ 2XB Goal status: INITIAL  4.  Improve static balance w/ mild sway x 30 sec during condition 2 M-CTSIB to improve safety with ADL Baseline: unable Goal status: INITIAL   ASSESSMENT:  CLINICAL IMPRESSION: Patient is a 78 y.o. lady who was seen today for physical therapy evaluation and treatment for gait abnormalities and balance  issues made worse by recent hx of left 1st toe amputation.  Exhibits general BLE weakness, impaired balance/postural stability unable to tolerate eyes closed standing balance any length of time and exhibits increased risk for falls per outcome measures.  Pt would benefit from PT services to develop and instruct in restorative, compensatory, and adapative strategies to improve mobility and reduce risk for falls   OBJECTIVE IMPAIRMENTS: Abnormal gait, decreased activity tolerance, decreased balance, decreased endurance, difficulty walking, decreased ROM, decreased strength, postural dysfunction, and pain.   ACTIVITY LIMITATIONS: carrying, lifting, bending, standing, squatting, transfers, reach over head, and locomotion level  PARTICIPATION LIMITATIONS: meal prep, cleaning, laundry, interpersonal relationship, shopping, community activity, and church  PERSONAL FACTORS: Age, Behavior pattern, Time since onset of injury/illness/exacerbation, and 3+ comorbidities: PMH are also affecting patient's functional outcome.   REHAB POTENTIAL: Good  CLINICAL DECISION MAKING: Evolving/moderate complexity  EVALUATION COMPLEXITY: Moderate  PLAN:  PT FREQUENCY: 1-2x/week  PT DURATION: 8 weeks  PLANNED INTERVENTIONS: 97750- Physical Performance Testing, 97110-Therapeutic exercises, 97530- Therapeutic activity, [id number]- Neuromuscular re-education, 97535- Self Care, 28413- Manual therapy, [id number]- Gait training, [phone]- Orthotic Initial, [id number]- Orthotic/Prosthetic subsequent, [phone]- Canalith repositioning, and [id number]- Aquatic Therapy  PLAN FOR NEXT SESSION: Berg Balance Test, initiate HEP for LE strength and balance   5:11 PM, 07/25/23 M. Kelly Nikya Busler, PT, DPT Physical Therapist- [place] Office Number: [phone]

## 2023-07-28 ENCOUNTER — Encounter: Payer: Self-pay | Admitting: Internal Medicine

## 2023-08-02 ENCOUNTER — Ambulatory Visit: Admitting: Obstetrics and Gynecology

## 2023-08-02 NOTE — Therapy (Incomplete)
 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   Depression    DOE (dyspnea on exertion)    GERD    Headache    Hemorrhoids    History of basal cell carcinoma excision    left cheek   History of colon polyps    History of hyperthyroidism 2011   RAI treatement   History of panic attacks    Hyperlipidemia    Hypertension    Hypothyroidism, postradioiodine therapy    followed by pcp   IDA (iron deficiency anemia)    intermittant   Insomnia    OA (osteoarthritis)    OSA (obstructive sleep apnea)    no cpap, per pt tried unable to tolerate   Peripheral neuropathy    legs and feet from hx back surgery's   Rash    bilateral axilla area -- per pt due to some personal wipes used other the counter   RLS (restless legs syndrome)    Spondylolisthesis at L1-L2 level    Tingling of both upper extremities    left greater than right due to cervical pinched nerve   Type 2 diabetes mellitus (HCC)    followed by pcp   Umbilical hernia    Urinary frequency    Urinary urgency    Past Surgical History:  Procedure Laterality Date   AMPUTATION TOE Left 05/31/2023   Procedure: LEFT GREAT TOE AMPUTATION;  Surgeon: Evertt Hoe, DPM;  Location: [facility];  Service: Orthopedics/Podiatry;  Laterality: Left;   ANTERIOR CERVICAL DECOMP/DISCECTOMY FUSION N/A 09/02/2014   Procedure: Cervical five- six  Anterior cervical decompression fusion;  Surgeon: Elna Haggis, MD;   Location: [facility];  Service: Neurosurgery;  Laterality: N/A;  C5-6 Anterior cervical decompression/diskectomy/fusion   BREAST BIOPSY Right 10/2018   x 2 benign   CARPAL TUNNEL RELEASE Right 2016   CATARACT EXTRACTION W/ INTRAOCULAR LENS  IMPLANT, BILATERAL  03/2015   COLONOSCOPY  2007   ESOPHAGOGASTRODUODENOSCOPY  2007   HEMORRHOID SURGERY  03/23/2011   Procedure: HEMORRHOIDECTOMY;  Surgeon: Brandy Cal. Cornett, MD;  Location: [facility];  Service: General;  Laterality: N/A;  lateral internal sphincterotomy and hemorrhoidectomy   LUMBAR FUSION  02/2015   L1 -- 2   PARTIAL KNEE ARTHROPLASTY Left 12/27/2017   Procedure: UNICOMPARTMENTAL LEFT KNEE;  Surgeon: Osa Blase, MD;  Location: [facility];  Service: Orthopedics;  Laterality: Left;   THORACIC DISCECTOMY N/A 12/15/2012   Procedure: Thoracic ten-eleven Thoracic laminectomy;  Surgeon: Elna Haggis, MD;  Location: [facility];  Service: Neurosurgery;  Laterality: N/A;  Thoracic ten-eleven Thoracic laminectomy   TUBAL LIGATION Bilateral 1991   Patient Active Problem List   Diagnosis Date Noted   Osteomyelitis of great toe of left foot (HCC) 05/31/2023   Diabetic foot ulcer (HCC) 05/30/2023   Complete rupture of left Achilles tendon 11/03/2021   Morbid obesity (HCC) 01/13/2018   S/P knee replacement 12/27/2017  Spondylolisthesis at L1-L2 level 03/18/2015   Spondylosis with myelopathy 09/02/2014   Cervical spondylosis with myelopathy 09/02/2014   Anxiety 12/21/2012   Chronic back pain 12/21/2012   Insomnia 12/21/2012   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, L2-5 12/15/2012   Spinal stenosis, lumbar region, with neurogenic claudication 10/26/2012   Spinal stenosis, thoracic 10/26/2012   Umbilical hernia 02/25/2012   OSA (obstructive sleep apnea) 11/12/2011   Exogenous obesity 11/12/2011   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  Rationale for Evaluation and Treatment: Rehabilitation  SUBJECTIVE:                                                                                                                                                                                             SUBJECTIVE STATEMENT: Had previous issue of left Achilles injury over a year ago which was managed non-surgically and notes some ongoing weakness as a result.  Had onset of infection to left great toe and underwent 1st toe amputation 06/05/23. Notes her balance has been affected from this amputation. Reports she has BLE pain with standing/walking.  Has been working with Home Health PT since D/C from hospital (reports about 7-8 sessions).  Overall, reports reduced activity tolerance and mainly sedentary especially as she was recovering from surgery.   Pt accompanied by: self  PERTINENT HISTORY: Radiographs: Disarticulation of left great toe at MPJ level. WB Status: Weightbearing as tolerated in postop shoe for an additional 1 week and then may progress back to regular shoe gea  PAIN:  Are you having pain? Yes: NPRS scale: constant/chronic Pain location: all over--legs, back, neck Pain description: pain/ache Aggravating factors: worse in the AM Relieving factors: rest  PRECAUTIONS: Fall  RED FLAGS: None   WEIGHT BEARING RESTRICTIONS: Weightbearing has been lifted  FALLS: Has patient fallen in last 6 months? Yes. Number of falls > 10  LIVING ENVIRONMENT: Lives with: lives alone Lives in: [place], lives in [place] on 1st floor Stairs: none Has following equipment at home: Otho Blitz - 4 wheeled and has  recently started with caregiver assist on Tue/Thur for housekeeping  PLOF: Independent with household mobility with device and Needs assistance with homemaking  PATIENT GOALS: improve balance  OBJECTIVE:     TODAY'S TREATMENT:  08/03/23 Activity Comments                         Note: Objective measures were completed at Evaluation unless otherwise noted.  DIAGNOSTIC FINDINGS: Radiographs: Disarticulation of left great toe at MPJ level  COGNITION: Overall cognitive status: Within functional limits for tasks assessed   SENSATION: Not tested  COORDINATION: WFL  EDEMA:  Some pitting 5-10 sec refill BLE  MUSCLE TONE:     DTRs:  NT  POSTURE: rounded shoulders, forward head, and flexed trunk   LOWER EXTREMITY ROM:     Active  Right Eval Left Eval  Hip flexion    Hip extension    Hip abduction    Hip adduction    Hip internal rotation    Hip external rotation    Knee flexion    Knee extension    Ankle dorsiflexion 5 10  Ankle plantarflexion    Ankle inversion    Ankle eversion     (Blank rows = not tested)  LOWER EXTREMITY MMT:    BLE strength gross 4/5   BED MOBILITY:  Not tested-reports indep  TRANSFERS: Independent sit-stand Modified indep from lower seat heights requiring UE support    CURB:  Findings: CGA  STAIRS: NT GAIT: Findings: Distance walked: 200, Assistive device utilized:Walker - 4 wheeled, Level of assistance: Modified independence, and Comments: unsteadiness in turns. CGA for uneven surfaces--narrow BOS  FUNCTIONAL TESTS:  5 times sit to stand: 16 sec Timed up and go (TUG): 15 sec  : 200 ft w/ 0QM Berg Balance: TBD  M-CTSIB  Condition 1: Firm Surface, EO 30 Sec, Mild and Moderate Sway  Condition 2: Firm Surface, EC 1 Sec, Moderate, Severe, and forward LOB Sway  Condition 3: Foam Surface, EO  Sec,  Sway  Condition 4: Foam Surface, EC  Sec,  Sway                                                                                                                                   TREATMENT DATE:     PATIENT EDUCATION: Education details: assessment details, rationale of PT intervention.  Recommendation of 2x/wk tx and pt requests 1x/wk Person educated: Patient Education method: Explanation Education comprehension: verbalized understanding  HOME EXERCISE PROGRAM: TBD  GOALS: Goals reviewed with patient? Yes  SHORT TERM GOALS: Target date: 08/15/2023    Patient will be independent in HEP to improve functional outcomes Baseline: Goal status: IN PROGRESS  2.  Demo improved BLE strength and reduced risk for falls per time < 15 sec 5xSTS test Baseline: 16 sec Goal status: IN PROGRESS  3.  Improved balance and reduce risk for falls per time 12 sec TUG test Baseline: 15 sec w/ 4WW Goal status: IN PROGRESS    LONG TERM GOALS: Target date: 09/19/2023    Demo improved balance and low risk for falls per score 45/56 Randye Buttner  Balance Test Baseline: TBD Goal status: IN PROGRESS  2.  Demo improved independence and safety with ambulation modified independent various surfaces to improve safety with community mobility Baseline: SBA-CGA Goal status: IN PROGRESS  3.  Demo improved gait speed/endurance by distance of 250 ft during Baseline: 200 ft w/ 7WG Goal status: IN PROGRESS  4.  Improve static balance w/ mild sway x 30 sec during condition 2 M-CTSIB to improve safety with ADL Baseline: unable Goal status: IN PROGRESS   ASSESSMENT:  CLINICAL IMPRESSION: Patient is a 78 y.o. lady who was seen today for physical therapy evaluation and treatment for gait abnormalities and balance issues made worse by recent hx of left 1st toe amputation.  Exhibits general BLE weakness, impaired balance/postural stability unable to tolerate eyes closed standing balance any length of time and exhibits increased risk for falls per outcome measures.  Pt would benefit from PT services to develop and instruct in  restorative, compensatory, and adapative strategies to improve mobility and reduce risk for falls   OBJECTIVE IMPAIRMENTS: Abnormal gait, decreased activity tolerance, decreased balance, decreased endurance, difficulty walking, decreased ROM, decreased strength, postural dysfunction, and pain.   ACTIVITY LIMITATIONS: carrying, lifting, bending, standing, squatting, transfers, reach over head, and locomotion level  PARTICIPATION LIMITATIONS: meal prep, cleaning, laundry, interpersonal relationship, shopping, community activity, and church  PERSONAL FACTORS: Age, Behavior pattern, Time since onset of injury/illness/exacerbation, and 3+ comorbidities: PMH are also affecting patient's functional outcome.   REHAB POTENTIAL: Good  CLINICAL DECISION MAKING: Evolving/moderate complexity  EVALUATION COMPLEXITY: Moderate  PLAN:  PT FREQUENCY: 1-2x/week  PT DURATION: 8 weeks  PLANNED INTERVENTIONS: 97750- Physical Performance Testing, 97110-Therapeutic exercises, 97530- Therapeutic activity, [id number]- Neuromuscular re-education, 97535- Self Care, 95621- Manual therapy, [id number]- Gait training, [phone]- Orthotic Initial, [id number]- Orthotic/Prosthetic subsequent, [phone]- Canalith repositioning, and [id number]- Aquatic Therapy  PLAN FOR NEXT SESSION: Berg Balance Test, initiate HEP for LE strength and balance

## 2023-08-03 ENCOUNTER — Telehealth: Payer: Self-pay | Admitting: Physical Therapy

## 2023-08-03 ENCOUNTER — Ambulatory Visit: Admitting: Physical Therapy

## 2023-08-03 ENCOUNTER — Other Ambulatory Visit

## 2023-08-03 NOTE — Telephone Encounter (Signed)
 Called and spoke to patient d/t missed PT appointment today- she reports that she called and left a detailed message cancelling her appointment d/t LE pain and difficulty walking. She also requested to cx appointment on 6/18 as well. Suggested patient call PCP's office to address pain to enable her to participate in therapy. Patient agreeable with this plan and was appreciative of the call.   Thaddeus Filippo, PT, DPT 08/03/23 1:49 PM  [facility] [address] [place], [address] [place], [place] [address] Phone # [phone] Fax # [phone]

## 2023-08-10 ENCOUNTER — Ambulatory Visit: Admitting: Physical Therapy

## 2023-08-15 ENCOUNTER — Ambulatory Visit: Payer: Self-pay

## 2023-08-15 ENCOUNTER — Ambulatory Visit

## 2023-08-15 NOTE — Telephone Encounter (Signed)
 Only or Action Required?: FYI only for provider.  Patient was last seen in primary care on 07/11/2023 by Theophilus Andrews, Tully GRADE, MD. Called Nurse Triage reporting Advice Only. Symptoms began ongoing. Interventions attempted: Other: Please see note, . Symptoms are: Anxiety, falls, constipation, balance issues gradually worsening.   Triage Disposition: See Provider within 3 days.  Patient/caregiver understands and will follow disposition?: Yes               Copied from CRM [id number]. Topic: Clinical - Red Word Triage >> Aug 15, 2023 11:19 AM [place] J wrote: [facility] that prompted transfer to Nurse Triage: Patient had a fall last week and now has a bruise on her left arm. She would like to be seen for the fall that took place as well as the worsening anxiety she's been experiencing. Dr. Theophilus would also like to see her to check her renal function and electrolytes before she can prescribe any more furosemide .  ----------------------------------------------------------------------- From previous Reason for Contact - Scheduling: Patient/patient representative is calling to schedule an appointment. Refer to attachments for appointment information. Reason for Disposition  Requesting regular office appointment  Answer Assessment - Initial Assessment Questions 1. REASON FOR CALL or QUESTION: What is your reason for calling today? or How can I best help you? or What question do you have that I can help answer?     Pt is having several issues. Pt is falling a lot d/t balance issues. This is causing her to have increased anxiety. Pt is not walking much d/t fear of falling. Pt is not eating well d/t fear of falling in kitchen where a recent fall happened. EMS cheked pt out after that and did not transport. Pt also injured her left bicep while pushing on her abdomen to help her with a BM. Pt is taking Miralax  now and this issue seems to be resolving. Pt reports that anxiety is a huge  issue for her and she is not leaving her home. Pt had a Life alert that she wore and would like to get that service again. Gave pt phone numbers for Crisis hotline and [facility].  Protocols used: Information Only Call - No Triage-A-AH

## 2023-08-15 NOTE — Telephone Encounter (Signed)
 Patient schedule 08/18/23

## 2023-08-17 ENCOUNTER — Other Ambulatory Visit: Payer: Self-pay | Admitting: Internal Medicine

## 2023-08-17 ENCOUNTER — Ambulatory Visit

## 2023-08-17 ENCOUNTER — Ambulatory Visit (INDEPENDENT_AMBULATORY_CARE_PROVIDER_SITE_OTHER)

## 2023-08-17 DIAGNOSIS — S99921A Unspecified injury of right foot, initial encounter: Secondary | ICD-10-CM | POA: Diagnosis not present

## 2023-08-17 DIAGNOSIS — E039 Hypothyroidism, unspecified: Secondary | ICD-10-CM

## 2023-08-17 NOTE — Progress Notes (Unsigned)
 Subjective:  Patient ID: Katie Booker, female    DOB: 07-Aug-1945,  MRN: [id number]  Chief Complaint  Patient presents with   Foot Injury    78 y.o. female presents with the above complaint.  Patient presents with complaint of right soft tissue contusion/foot injury.  She states that she fell on her back and bruised her foot.  She does not have any pain.  She has neuropathy she just wanted get it eval make sure she did not break anything denies any other acute complaints pain scale 7 out of 10 dull aching nature.   Review of Systems: Negative except as noted in the HPI. Denies N/V/F/Ch.  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   Depression    DOE (dyspnea on exertion)    GERD    Headache    Hemorrhoids    History of basal cell carcinoma excision    left cheek   History of colon polyps    History of hyperthyroidism 2011   RAI treatement   History of panic attacks    Hyperlipidemia    Hypertension    Hypothyroidism, postradioiodine therapy    followed by pcp   IDA (iron deficiency anemia)    intermittant   Insomnia    OA (osteoarthritis)    OSA (obstructive sleep apnea)    no cpap, per pt tried unable to tolerate   Peripheral neuropathy    legs and feet from hx back surgery's   Rash    bilateral axilla area -- per pt due to some personal wipes used other the counter   RLS (restless legs syndrome)    Spondylolisthesis at L1-L2 level    Tingling of both upper extremities    left greater than right due to cervical pinched nerve   Type 2 diabetes mellitus (HCC)    followed by pcp   Umbilical hernia    Urinary frequency    Urinary urgency     Current Outpatient Medications:    ALPRAZolam  (XANAX ) 0.5 MG  tablet, Take 1 tablet (0.5 mg total) by mouth 2 (two) times daily., Disp: 90 tablet, Rfl: 2   atenolol  (TENORMIN ) 25 MG tablet, TAKE 1 TABLET BY MOUTH DAILY, Disp: 100 tablet, Rfl: 2   atorvastatin  (LIPITOR) 40 MG tablet, TAKE 1 TABLET BY MOUTH DAILY, Disp: 100 tablet, Rfl: 2   calcium  citrate (CALCITRATE - DOSED IN MG ELEMENTAL CALCIUM ) 950 (200 Ca) MG tablet, Take 200 mg of elemental calcium  by mouth daily., Disp: , Rfl:    Carboxymethylcell-Hypromellose (GENTEAL OP), Apply to eye., Disp: , Rfl:    Carboxymethylcellulose Sodium (LUBRICANT EYE DROPS OP), Apply 2 drops to eye 4 (four) times daily as needed (dry eyes). , Disp: , Rfl:    estradiol  (ESTRACE ) 0.1 MG/GM vaginal cream, Place 0.5 g vaginally 2 (two) times a week. Place 0.5g nightly for two weeks then twice a week after, Disp: 42.5 g, Rfl: 11   furosemide  (LASIX ) 20 MG tablet, Take 1 tablet (20 mg total) by mouth daily for 5 days., Disp: 5 tablet, Rfl: 0   HYDROcodone -acetaminophen  (NORCO) 7.5-325 MG tablet, Take 1 tablet by mouth every 6 (six) hours as  needed for moderate pain (pain score 4-6)., Disp: 120 tablet, Rfl: 0   HYDROcodone -acetaminophen  (NORCO) 7.5-325 MG tablet, Take 1 tablet by mouth every 6 (six) hours as needed for moderate pain (pain score 4-6)., Disp: 120 tablet, Rfl: 0   HYDROcodone -acetaminophen  (NORCO) 7.5-325 MG tablet, Take 1 tablet by mouth every 6 (six) hours as needed for moderate pain (pain score 4-6)., Disp: 120 tablet, Rfl: 0   hydrocortisone  2.5 % cream, APPLY TO AFFECTED AREA(S)  TOPICALLY TWICE DAILY, Disp: 113.4 g, Rfl: 0   ibuprofen (ADVIL) 200 MG tablet, Take 200 mg by mouth daily. 1-2 tablets daily, Disp: , Rfl:    Lancets (ONETOUCH ULTRASOFT) lancets, Use daily, Disp: 100 each, Rfl: 6   levothyroxine  (SYNTHROID ) 125 MCG tablet, TAKE 1 TABLET BY MOUTH DAILY, Disp: 100 tablet, Rfl: 1   meloxicam  (MOBIC ) 15 MG tablet, TAKE 1 TABLET BY MOUTH DAILY, Disp: 100 tablet, Rfl: 2   metFORMIN  (GLUCOPHAGE ) 1000 MG  tablet, TAKE 1 TABLET BY MOUTH TWICE  DAILY WITH MEALS, Disp: 200 tablet, Rfl: 1   Multiple Vitamin (MULTIVITAMIN) tablet, Take 1 tablet by mouth daily., Disp: , Rfl:    Omega-3 Fatty Acids (FISH OIL) 1200 MG CAPS, Take 1 capsule by mouth daily. (Patient not taking: Reported on 07/11/2023), Disp: , Rfl:    ONE TOUCH ULTRA TEST test strip, USE 1 DAILY AS DIRECTED (Patient taking differently: 1 each by Other route as needed for other.), Disp: 100 each, Rfl: 3   pantoprazole  (PROTONIX ) 40 MG tablet, TAKE 1 TABLET BY MOUTH TWICE  DAILY BEFORE MEALS, Disp: 200 tablet, Rfl: 1   pioglitazone  (ACTOS ) 15 MG tablet, TAKE 1 TABLET BY MOUTH DAILY, Disp: 100 tablet, Rfl: 2   pramipexole  (MIRAPEX ) 0.125 MG tablet, Take 1 tablet (0.125 mg total) by mouth at bedtime., Disp: 30 tablet, Rfl: 2   pregabalin  (LYRICA ) 75 MG capsule, TAKE 1 CAPSULE BY MOUTH TWICE  DAILY, Disp: 180 capsule, Rfl: 0   tiZANidine  (ZANAFLEX ) 4 MG tablet, TAKE 1 TABLET BY MOUTH 3 TIMES  DAILY AS NEEDED, Disp: 180 tablet, Rfl: 0   tobramycin  (TOBREX ) 0.3 % ophthalmic solution, Place 1 drop into both eyes every 6 (six) hours. Day before injection, day of injection and day after injection, Disp: , Rfl:    triamcinolone  cream (KENALOG ) 0.1 %, Apply 1 Application topically 2 (two) times daily., Disp: 80 g, Rfl: 0   vortioxetine  HBr (TRINTELLIX ) 5 MG TABS tablet, Take 10 mg by mouth daily., Disp: , Rfl:   Social History   Tobacco Use  Smoking Status Former   Current packs/day: 0.00   Average packs/day: 1 pack/day for 20.0 years (20.0 ttl pk-yrs)   Types: Cigarettes   Start date: 02/23/1984   Quit date: 02/23/2004   Years since quitting: 19.4  Smokeless Tobacco Never  Tobacco Comments   quit 2007    Allergies  Allergen Reactions   Duraprep [Antiseptic Products, Misc.] Itching and Rash    Irritation everywhere prep was used   Advil Allergy Sinus [Chlorpheniramine-Pse-Ibuprofen]     Nervousness    Betadine [Povidone Iodine] Other (See  Comments)    Burning sensation.    Hydromorphone  Hcl Other (See Comments)    Makes crazy   Morphine  And Codeine Other (See Comments)    hallucinations   Objective:  There were no vitals filed for this visit. There is no height or weight on file to calculate BMI. Constitutional Well developed. Well nourished.  Vascular Dorsalis pedis pulses palpable bilaterally. Posterior tibial  pulses palpable bilaterally. Capillary refill normal to all digits.  No cyanosis or clubbing noted. Pedal hair growth normal.  Neurologic Normal speech. Oriented to person, place, and time. Epicritic sensation to light touch grossly present bilaterally.  Dermatologic Nails well groomed and normal in appearance. No open wounds. No skin lesions.  Orthopedic: No pain on palpation to the right foot ecchymosis bruising noted to the dorsum and plantar arch.  Ecchymosis is resolving slowly.  No open wounds or lesion noted.   Radiographs: 3 views of skeletally mature adult right foot: No fractures noted no abnormalities noted midfoot arthritis noted high arch foot structure noted Assessment:   1. Foot injury, right, initial encounter    Plan:  Patient was evaluated and treated and all questions answered.  Right foot injury injury with soft tissue contusion - All questions and concerns were discussed with the patient in extensive detail given the nature of the soft tissue contusion without osseous injury I believe patient will benefit from cam boot immobilization patient already has cam boot at home she will place herself in it. - I will see her back in 4 weeks if it continues to be bothersome.  Patient agrees with the plan  No follow-ups on file.

## 2023-08-18 ENCOUNTER — Ambulatory Visit: Admitting: Adult Health

## 2023-08-18 ENCOUNTER — Ambulatory Visit: Admitting: Internal Medicine

## 2023-08-18 DIAGNOSIS — E119 Type 2 diabetes mellitus without complications: Secondary | ICD-10-CM

## 2023-08-22 ENCOUNTER — Encounter: Payer: Self-pay | Admitting: Internal Medicine

## 2023-08-22 ENCOUNTER — Ambulatory Visit: Admitting: Internal Medicine

## 2023-08-22 DIAGNOSIS — F419 Anxiety disorder, unspecified: Secondary | ICD-10-CM

## 2023-08-23 MED ORDER — ALPRAZOLAM 0.5 MG PO TABS
0.5000 mg | ORAL_TABLET | Freq: Two times a day (BID) | ORAL | 2 refills | Status: DC
Start: 2023-08-23 — End: 2023-09-14

## 2023-08-23 NOTE — Therapy (Incomplete)
 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   Depression    DOE (dyspnea on exertion)    GERD    Headache    Hemorrhoids    History of basal cell carcinoma excision    left cheek   History of colon polyps    History of hyperthyroidism 2011   RAI treatement   History of panic attacks    Hyperlipidemia    Hypertension    Hypothyroidism, postradioiodine therapy    followed by pcp   IDA (iron deficiency anemia)    intermittant   Insomnia    OA (osteoarthritis)    OSA (obstructive sleep apnea)    no cpap, per pt tried unable to tolerate   Peripheral neuropathy    legs and feet from hx back surgery's   Rash    bilateral axilla area -- per pt due to some personal wipes used other the counter   RLS (restless legs syndrome)    Spondylolisthesis at L1-L2 level    Tingling of both upper extremities    left greater than right due to cervical pinched nerve   Type 2 diabetes mellitus (HCC)    followed by pcp   Umbilical hernia    Urinary frequency    Urinary urgency    Past Surgical History:  Procedure Laterality Date   AMPUTATION TOE Left 05/31/2023   Procedure: LEFT GREAT TOE AMPUTATION;  Surgeon: Malvin Marsa FALCON, DPM;  Location: [facility];  Service: Orthopedics/Podiatry;  Laterality: Left;   ANTERIOR CERVICAL DECOMP/DISCECTOMY FUSION N/A 09/02/2014   Procedure: Cervical five- six  Anterior cervical decompression fusion;  Surgeon: Victory Gens, MD;  Location:  [facility];  Service: Neurosurgery;  Laterality: N/A;  C5-6 Anterior cervical decompression/diskectomy/fusion   BREAST BIOPSY Right 10/2018   x 2 benign   CARPAL TUNNEL RELEASE Right 2016   CATARACT EXTRACTION W/ INTRAOCULAR LENS  IMPLANT, BILATERAL  03/2015   COLONOSCOPY  2007   ESOPHAGOGASTRODUODENOSCOPY  2007   HEMORRHOID SURGERY  03/23/2011   Procedure: HEMORRHOIDECTOMY;  Surgeon: Debby LABOR. Cornett, MD;  Location: [facility];  Service: General;  Laterality: N/A;  lateral internal sphincterotomy and hemorrhoidectomy   LUMBAR FUSION  02/2015   L1 -- 2   PARTIAL KNEE ARTHROPLASTY Left 12/27/2017   Procedure: UNICOMPARTMENTAL LEFT KNEE;  Surgeon: Josefina Chew, MD;  Location: [facility];  Service: Orthopedics;  Laterality: Left;   THORACIC DISCECTOMY N/A 12/15/2012   Procedure: Thoracic ten-eleven Thoracic laminectomy;  Surgeon: Victory Gens, MD;  Location: [facility];  Service: Neurosurgery;  Laterality: N/A;  Thoracic ten-eleven Thoracic laminectomy   TUBAL LIGATION Bilateral 1991   Patient Active Problem List   Diagnosis Date Noted   Osteomyelitis of great toe of left foot (HCC) 05/31/2023   Diabetic foot ulcer (HCC) 05/30/2023   Complete rupture of left Achilles tendon 11/03/2021   Morbid obesity (HCC) 01/13/2018   S/P knee replacement 12/27/2017  Spondylolisthesis at L1-L2 level 03/18/2015   Spondylosis with myelopathy 09/02/2014   Cervical spondylosis with myelopathy 09/02/2014   Anxiety 12/21/2012   Chronic back pain 12/21/2012   Insomnia 12/21/2012   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, 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 12/15/2012   Spinal stenosis, lumbar region, with neurogenic claudication 10/26/2012   Spinal stenosis, thoracic 10/26/2012   Umbilical hernia 02/25/2012   OSA (obstructive sleep apnea) 11/12/2011   Exogenous obesity 11/12/2011   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  Rationale for Evaluation and Treatment: Rehabilitation  SUBJECTIVE:                                                                                                                                                                                             SUBJECTIVE STATEMENT: Had previous issue of left Achilles injury over a year ago which was managed non-surgically and notes some ongoing weakness as a result.  Had onset of infection to left great toe and underwent 1st toe amputation 06/05/23. Notes her balance has been affected from this amputation. Reports she has BLE pain with standing/walking.  Has been working with Home Health PT since D/C from hospital (reports about 7-8 sessions).  Overall, reports reduced activity tolerance and mainly sedentary especially as she was recovering from surgery.   Pt accompanied by: self  PERTINENT HISTORY: Radiographs: Disarticulation of left great toe at MPJ level. WB Status: Weightbearing as tolerated in postop shoe for an additional 1 week and then may progress back to regular shoe gea  PAIN:  Are you having pain? Yes: NPRS scale: constant/chronic Pain location: all over--legs, back, neck Pain description: pain/ache Aggravating factors: worse in the AM Relieving factors: rest  PRECAUTIONS: Fall  RED FLAGS: None   WEIGHT BEARING RESTRICTIONS: Weightbearing has been lifted  FALLS: Has patient fallen in last 6 months? Yes. Number of falls > 10  LIVING ENVIRONMENT: Lives with: lives alone Lives in: [place], lives in [place] on 1st floor Stairs: none Has following equipment at home: Vannie - 4 wheeled and has  recently started with caregiver assist on Tue/Thur for housekeeping  PLOF: Independent with household mobility with device and Needs assistance with homemaking  PATIENT GOALS: improve balance  OBJECTIVE:     TODAY'S TREATMENT:  08/24/23 Activity Comments                                  Note: Objective measures were completed at Evaluation unless otherwise noted.  DIAGNOSTIC FINDINGS: Radiographs: Disarticulation of left great toe at MPJ level  COGNITION: Overall cognitive status: Within functional limits for tasks assessed   SENSATION: Not tested  COORDINATION: WFL  EDEMA:  Some pitting 5-10 sec refill BLE  MUSCLE TONE:     DTRs:  NT  POSTURE: rounded shoulders, forward head, and flexed trunk   LOWER EXTREMITY ROM:     Active  Right Eval Left Eval  Hip flexion    Hip extension    Hip abduction    Hip adduction    Hip internal rotation    Hip external rotation    Knee flexion    Knee extension    Ankle dorsiflexion 5 10  Ankle plantarflexion    Ankle inversion    Ankle eversion     (Blank rows = not tested)  LOWER EXTREMITY MMT:    BLE strength gross 4/5   BED MOBILITY:  Not tested-reports indep  TRANSFERS: Independent sit-stand Modified indep from lower seat heights requiring UE support    CURB:  Findings: CGA  STAIRS: NT GAIT: Findings: Distance walked: 200, Assistive device utilized:Walker - 4 wheeled, Level of assistance: Modified independence, and Comments: unsteadiness in turns. CGA for uneven surfaces--narrow BOS  FUNCTIONAL TESTS:  5 times sit to stand: 16 sec Timed up and go (TUG): 15 sec  : 200 ft w/ 5TT Berg Balance: TBD  M-CTSIB  Condition 1: Firm Surface, EO 30 Sec, Mild and Moderate Sway  Condition 2: Firm Surface, EC 1 Sec, Moderate, Severe, and forward LOB Sway  Condition 3: Foam Surface, EO  Sec,  Sway  Condition 4: Foam Surface, EC  Sec,  Sway                                                                                                                                   TREATMENT DATE:     PATIENT EDUCATION: Education details: assessment details, rationale of PT intervention.  Recommendation of 2x/wk tx and pt requests 1x/wk Person educated: Patient Education method: Explanation Education comprehension: verbalized understanding  HOME EXERCISE PROGRAM: TBD  GOALS: Goals reviewed with patient? Yes  SHORT TERM GOALS: Target date: 08/15/2023    Patient will be independent in HEP to improve functional outcomes Baseline: Goal status: IN PROGRESS  2.  Demo improved BLE strength and reduced risk for falls per time < 15 sec 5xSTS test Baseline: 16 sec Goal status: IN PROGRESS  3.  Improved balance and reduce risk for falls per time 12 sec TUG test Baseline: 15 sec w/ 4WW Goal status: IN PROGRESS    LONG TERM GOALS: Target date: 09/19/2023    Demo improved balance  and low risk for falls per score 45/56 Berg Balance Test Baseline: TBD Goal status: IN PROGRESS  2.  Demo improved independence and safety with ambulation modified independent various surfaces to improve safety with community mobility Baseline: SBA-CGA Goal status: IN PROGRESS  3.  Demo improved gait speed/endurance by distance of 250 ft during Baseline: 200 ft w/ 5TT Goal status: IN PROGRESS  4.  Improve static balance w/ mild sway x 30 sec during condition 2 M-CTSIB to improve safety with ADL Baseline: unable Goal status: IN PROGRESS   ASSESSMENT:  CLINICAL IMPRESSION: Patient is a 78 y.o. lady who was seen today for physical therapy evaluation and treatment for gait abnormalities and balance issues made worse by recent hx of left 1st toe amputation.  Exhibits general BLE weakness, impaired balance/postural stability unable to tolerate eyes closed standing balance any length of time and exhibits increased risk for falls per outcome measures.  Pt would benefit from PT services to develop and  instruct in restorative, compensatory, and adapative strategies to improve mobility and reduce risk for falls   OBJECTIVE IMPAIRMENTS: Abnormal gait, decreased activity tolerance, decreased balance, decreased endurance, difficulty walking, decreased ROM, decreased strength, postural dysfunction, and pain.   ACTIVITY LIMITATIONS: carrying, lifting, bending, standing, squatting, transfers, reach over head, and locomotion level  PARTICIPATION LIMITATIONS: meal prep, cleaning, laundry, interpersonal relationship, shopping, community activity, and church  PERSONAL FACTORS: Age, Behavior pattern, Time since onset of injury/illness/exacerbation, and 3+ comorbidities: PMH are also affecting patient's functional outcome.   REHAB POTENTIAL: Good  CLINICAL DECISION MAKING: Evolving/moderate complexity  EVALUATION COMPLEXITY: Moderate  PLAN:  PT FREQUENCY: 1-2x/week  PT DURATION: 8 weeks  PLANNED INTERVENTIONS: 97750- Physical Performance Testing, 97110-Therapeutic exercises, 97530- Therapeutic activity, [id number]- Neuromuscular re-education, 97535- Self Care, [id number]- Manual therapy, [id number]- Gait training, [phone]- Orthotic Initial, [id number]- Orthotic/Prosthetic subsequent, [phone]- Canalith repositioning, and [id number]- Aquatic Therapy  PLAN FOR NEXT SESSION: Berg Balance Test, initiate HEP for LE strength and balance

## 2023-08-24 ENCOUNTER — Ambulatory Visit: Admitting: Physical Therapy

## 2023-08-24 ENCOUNTER — Ambulatory Visit: Admitting: Internal Medicine

## 2023-08-24 ENCOUNTER — Encounter: Payer: Self-pay | Admitting: Physical Therapy

## 2023-08-24 DIAGNOSIS — R262 Difficulty in walking, not elsewhere classified: Secondary | ICD-10-CM

## 2023-08-24 DIAGNOSIS — R2681 Unsteadiness on feet: Secondary | ICD-10-CM

## 2023-08-24 DIAGNOSIS — M6281 Muscle weakness (generalized): Secondary | ICD-10-CM

## 2023-08-24 DIAGNOSIS — S98112D Complete traumatic amputation of left great toe, subsequent encounter: Secondary | ICD-10-CM | POA: Insufficient documentation

## 2023-08-24 NOTE — Therapy (Signed)
 [place] [facility] [address], [address] [place], [place], [address] Phone: [phone]   Fax:  [phone]  Patient Details  Name: Tiearra Colwell MRN: [id number] Date of Birth: [birth year] Referring Provider:  Theophilus Andrews, Estel*  Encounter Date: 08/24/2023    Pt arrived to session in w/c with friend. The patient reports I don't think I'm ready for this yet. Reports that she has problems with restless legs and does not feel that she is ready to participate. Has a appointment with her PCP this afternoon and has several things to discuss with her. Patient was educated on returning to therapy with new referral when she is ready to participate with PT. Patient agreeable to DC.   PHYSICAL THERAPY DISCHARGE SUMMARY  Visits from Start of Care: 1  Current functional level related to goals / functional outcomes: Unable to assess; pt declined therapy    Remaining deficits: Unable to assess; pt declined therapy    Education / Equipment: -  Plan: Patient agrees to discharge.  Patient goals were not met. Patient is being discharged due to declining therapy.    Louana Terrilyn Christians, PT, DPT 08/24/23 1:26 PM  [facility] [address] [place], [address] [place], [place] [address] Phone # [phone] Fax # [phone]

## 2023-08-31 ENCOUNTER — Ambulatory Visit

## 2023-08-31 ENCOUNTER — Other Ambulatory Visit: Payer: Self-pay | Admitting: Internal Medicine

## 2023-08-31 DIAGNOSIS — R600 Localized edema: Secondary | ICD-10-CM

## 2023-08-31 DIAGNOSIS — G8929 Other chronic pain: Secondary | ICD-10-CM

## 2023-08-31 DIAGNOSIS — E119 Type 2 diabetes mellitus without complications: Secondary | ICD-10-CM

## 2023-08-31 DIAGNOSIS — G2581 Restless legs syndrome: Secondary | ICD-10-CM

## 2023-08-31 DIAGNOSIS — L309 Dermatitis, unspecified: Secondary | ICD-10-CM

## 2023-09-07 ENCOUNTER — Ambulatory Visit

## 2023-09-07 ENCOUNTER — Ambulatory Visit: Admitting: Obstetrics and Gynecology

## 2023-09-12 ENCOUNTER — Ambulatory Visit: Payer: Self-pay | Admitting: *Deleted

## 2023-09-12 NOTE — Telephone Encounter (Signed)
 Reason for Disposition  [1] MODERATE weakness (e.g., interferes with work, school, normal activities) AND [2] persists > 3 days  Answer Assessment - Initial Assessment Questions 1. DESCRIPTION: Describe how you are feeling.     I'm falling down a lot.   Twice a week at least.  My feet and ankles and legs are so weak I loosemy balance.   I have to call the paramedics to come get me up.   I've called the paramedics a lot over the last 2-3 weeks.   This is something very different.   The falling is new.   I have restless legs syndrome.   That got better with a medication Mirapex .  It did a great job.  When I started falling that brought on a new round of restless legs syndrome.   It's really bad.   I have restless legs syndrome now   I fell an hour and a half ago.   I had to call the paramedics to come get me up.   This is a nightmare.   She's so weak she can't get to her dr appts.   I've had to cancel my appts because I can't get in.   I'm too tired and weak to see.     2. SEVERITY: How bad is it?  Can you stand and walk?     I can stand and walk but my legs given out and I fall down.   I feel like I had a stroke 2-3 weeks ago.   I'm confused.  I didn't know where I lived for a while.   I felt like I lived in a different place and people.   That has gotten better.   That really disturbed me.   I'm still having some of those symptoms.   Saskia Simerson is with her helping. I'm afraid to go outside because I'm afraid I'm going to fall.    3. ONSET: When did these symptoms begin? (e.g., hours, days, weeks, months)     My legs being restless started 3 falls ago.  That was the 2nd restless legs syndrome.    4. CAUSE: What do you think is causing the weakness or fatigue? (e.g., not drinking enough fluids, medical problem, trouble sleeping)     The whole picture got worse 2-3 months ago.   5. NEW MEDICINES:  Have you started on any new medicines recently? (e.g., opioid pain medicines,  benzodiazepines, muscle relaxants, antidepressants, antihistamines, neuroleptics, beta blockers)     No 6. OTHER SYMPTOMS: Do you have any other symptoms? (e.g., chest pain, fever, cough, SOB, vomiting, diarrhea, bleeding, other areas of pain)     See above 7. PREGNANCY: Is there any chance you are pregnant? When was your last menstrual period?     N/A  Protocols used: Weakness (Generalized) and Fatigue-A-AH FYI Only or Action Required?: FYI only for provider.  Patient was last seen in primary care on 07/11/2023 by Theophilus Andrews, Tully GRADE, MD.  Called Nurse Triage reporting Fatigue. Legs feeling weak and falling twice a week.   Severe restless legs syndrome.  At one point got very confused but that cleared up.  Weak and tired.  Has a hard time getting into her appts.  Symptoms began several months ago. Started 2-3 months ago and getting worse.  Interventions attempted: Rest, hydration, or home remedies.  Symptoms are: gradually worsening.  Triage Disposition: See Within 3 Days in Office  Patient/caregiver understands and will follow disposition?: Yes Due  to pt's schedule Wed. Afternoon was soonest she could come in.

## 2023-09-14 ENCOUNTER — Ambulatory Visit: Admitting: Internal Medicine

## 2023-09-14 ENCOUNTER — Encounter: Payer: Self-pay | Admitting: Internal Medicine

## 2023-09-14 ENCOUNTER — Ambulatory Visit

## 2023-09-14 VITALS — BP 102/68 | HR 67 | Temp 98.2°F

## 2023-09-14 DIAGNOSIS — E1169 Type 2 diabetes mellitus with other specified complication: Secondary | ICD-10-CM | POA: Diagnosis not present

## 2023-09-14 DIAGNOSIS — M48062 Spinal stenosis, lumbar region with neurogenic claudication: Secondary | ICD-10-CM

## 2023-09-14 DIAGNOSIS — F419 Anxiety disorder, unspecified: Secondary | ICD-10-CM

## 2023-09-14 DIAGNOSIS — R600 Localized edema: Secondary | ICD-10-CM | POA: Diagnosis not present

## 2023-09-14 DIAGNOSIS — R296 Repeated falls: Secondary | ICD-10-CM

## 2023-09-14 LAB — POCT GLYCOSYLATED HEMOGLOBIN (HGB A1C): Hemoglobin A1C: 5.8 % — AB (ref 4.0–5.6)

## 2023-09-14 MED ORDER — TIZANIDINE HCL 4 MG PO TABS
4.0000 mg | ORAL_TABLET | Freq: Three times a day (TID) | ORAL | 0 refills | Status: AC | PRN
Start: 2023-09-14 — End: ?

## 2023-09-14 MED ORDER — ALPRAZOLAM 0.5 MG PO TABS: 1.0000 mg | ORAL_TABLET | Freq: Two times a day (BID) | ORAL | 2 refills | Status: AC

## 2023-09-14 NOTE — Progress Notes (Signed)
 Established Patient Office Visit     CC/Reason for Visit: Frequent falls  HPI: Katie Booker is a 78 y.o. female who is coming in today for the above mentioned reasons. Past Medical History is significant for: Spinal stenosis, anxiety, insomnia, hypertension, hyperlipidemia, type 2 diabetes, hypothyroidism, restless leg syndrome, history of uterine prolapse.  She has had a lot of falls lately sometimes once or twice a day at home.  She has had to call the paramedics many times as she is unable to get off the floor.  Her son and daughter visited yesterday and they are all in agreement with SNF placement and are requesting assistance.  She is not confused.  No focal neurologic deficits.   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   Depression    DOE (dyspnea on exertion)    GERD    Headache    Hemorrhoids    History of basal cell carcinoma excision    left cheek   History of colon polyps    History of hyperthyroidism 2011   RAI treatement   History of panic attacks    Hyperlipidemia    Hypertension    Hypothyroidism, postradioiodine therapy    followed by pcp   IDA (iron deficiency anemia)    intermittant   Insomnia    OA (osteoarthritis)    OSA (obstructive sleep apnea)    no cpap, per pt tried unable to tolerate   Peripheral neuropathy    legs and feet from hx back surgery's   Rash    bilateral axilla area -- per pt due to some personal wipes used other the counter   RLS (restless legs syndrome)    Spondylolisthesis at L1-L2 level    Tingling of both upper extremities    left greater than right due to cervical pinched nerve   Type 2 diabetes mellitus (HCC)    followed  by pcp   Umbilical hernia    Urinary frequency    Urinary urgency     Past Surgical History:  Procedure Laterality Date   AMPUTATION TOE Left 05/31/2023   Procedure: LEFT GREAT TOE AMPUTATION;  Surgeon: Malvin Marsa FALCON, DPM;  Location: [facility];  Service: Orthopedics/Podiatry;  Laterality: Left;   ANTERIOR CERVICAL DECOMP/DISCECTOMY FUSION N/A 09/02/2014   Procedure: Cervical five- six  Anterior cervical decompression fusion;  Surgeon: Victory Gens, MD;  Location: [facility];  Service: Neurosurgery;  Laterality: N/A;  C5-6 Anterior cervical decompression/diskectomy/fusion   BREAST BIOPSY Right 10/2018   x 2 benign   CARPAL TUNNEL RELEASE Right 2016   CATARACT EXTRACTION W/ INTRAOCULAR LENS  IMPLANT, BILATERAL  03/2015   COLONOSCOPY  2007   ESOPHAGOGASTRODUODENOSCOPY  2007   HEMORRHOID SURGERY  03/23/2011   Procedure: HEMORRHOIDECTOMY;  Surgeon: Debby LABOR. Cornett, MD;  Location: [facility];  Service: General;  Laterality: N/A;  lateral internal sphincterotomy and hemorrhoidectomy   LUMBAR FUSION  02/2015   L1 -- 2   PARTIAL KNEE ARTHROPLASTY Left 12/27/2017   Procedure: UNICOMPARTMENTAL LEFT KNEE;  Surgeon: Josefina Chew, MD;  Location: [facility];  Service: Orthopedics;  Laterality: Left;  THORACIC DISCECTOMY N/A 12/15/2012   Procedure: Thoracic ten-eleven Thoracic laminectomy;  Surgeon: Victory Gens, MD;  Location: [facility];  Service: Neurosurgery;  Laterality: N/A;  Thoracic ten-eleven Thoracic laminectomy   TUBAL LIGATION Bilateral 1991    Social History:  reports that she quit smoking about 19 years ago. Her smoking use included cigarettes. She started smoking about 39 years ago. She has a 20 pack-year smoking history. She has never used smokeless tobacco. She reports that she does not currently use alcohol . She reports that she does not use drugs.  Allergies: Allergies  Allergen Reactions   Duraprep [Antiseptic Products, Misc.] Itching and Rash     Irritation everywhere prep was used   Advil Allergy Sinus [Chlorpheniramine-Pse-Ibuprofen]     Nervousness    Betadine [Povidone Iodine] Other (See Comments)    Burning sensation.    Hydromorphone  Hcl Other (See Comments)    Makes crazy   Morphine  And Codeine Other (See Comments)    hallucinations    Family History:  Family History  Problem Relation Age of Onset   Heart failure Mother    Diabetes Father    COPD Father    Cancer Maternal Grandfather        stomach     Current Outpatient Medications:    atenolol  (TENORMIN ) 25 MG tablet, TAKE 1 TABLET BY MOUTH DAILY, Disp: 100 tablet, Rfl: 2   atorvastatin  (LIPITOR) 40 MG tablet, TAKE 1 TABLET BY MOUTH DAILY, Disp: 100 tablet, Rfl: 2   calcium  citrate (CALCITRATE - DOSED IN MG ELEMENTAL CALCIUM ) 950 (200 Ca) MG tablet, Take 200 mg of elemental calcium  by mouth daily., Disp: , Rfl:    Carboxymethylcell-Hypromellose (GENTEAL OP), Apply to eye., Disp: , Rfl:    Carboxymethylcellulose Sodium (LUBRICANT EYE DROPS OP), Apply 2 drops to eye 4 (four) times daily as needed (dry eyes). , Disp: , Rfl:    estradiol  (ESTRACE ) 0.1 MG/GM vaginal cream, Place 0.5 g vaginally 2 (two) times a week. Place 0.5g nightly for two weeks then twice a week after, Disp: 42.5 g, Rfl: 11   furosemide  (LASIX ) 20 MG tablet, TAKE 1 TABLET BY MOUTH DAILY FOR 5 DAYS., Disp: 5 tablet, Rfl: 0   HYDROcodone -acetaminophen  (NORCO) 7.5-325 MG tablet, Take 1 tablet by mouth every 6 (six) hours as needed for moderate pain (pain score 4-6)., Disp: 120 tablet, Rfl: 0   HYDROcodone -acetaminophen  (NORCO) 7.5-325 MG tablet, Take 1 tablet by mouth every 6 (six) hours as needed for moderate pain (pain score 4-6)., Disp: 120 tablet, Rfl: 0   HYDROcodone -acetaminophen  (NORCO) 7.5-325 MG tablet, Take 1 tablet by mouth every 6 (six) hours as needed for moderate pain (pain score 4-6)., Disp: 120 tablet, Rfl: 0   hydrocortisone  2.5 % cream, APPLY TO AFFECTED AREA(S)  TOPICALLY TWICE DAILY,  Disp: 113.4 g, Rfl: 0   Lancets (ONETOUCH ULTRASOFT) lancets, Use daily, Disp: 100 each, Rfl: 6   levothyroxine  (SYNTHROID ) 125 MCG tablet, TAKE 1 TABLET BY MOUTH DAILY, Disp: 100 tablet, Rfl: 1   meloxicam  (MOBIC ) 15 MG tablet, TAKE 1 TABLET BY MOUTH DAILY, Disp: 100 tablet, Rfl: 1   metFORMIN  (GLUCOPHAGE ) 1000 MG tablet, TAKE 1 TABLET BY MOUTH TWICE  DAILY WITH MEALS, Disp: 200 tablet, Rfl: 1   Multiple Vitamin (MULTIVITAMIN) tablet, Take 1 tablet by mouth daily., Disp: , Rfl:    ONE TOUCH ULTRA TEST test strip, USE 1 DAILY AS DIRECTED, Disp: 100 each, Rfl: 3   pantoprazole  (PROTONIX ) 40 MG tablet, TAKE 1 TABLET BY  MOUTH TWICE  DAILY BEFORE MEALS, Disp: 200 tablet, Rfl: 1   pioglitazone  (ACTOS ) 15 MG tablet, TAKE 1 TABLET BY MOUTH DAILY, Disp: 100 tablet, Rfl: 2   pramipexole  (MIRAPEX ) 0.125 MG tablet, TAKE 1 TABLET(0.125 MG) BY MOUTH AT BEDTIME, Disp: 30 tablet, Rfl: 2   pregabalin  (LYRICA ) 75 MG capsule, TAKE 1 CAPSULE(75 MG) BY MOUTH TWICE DAILY, Disp: 180 capsule, Rfl: 0   tobramycin  (TOBREX ) 0.3 % ophthalmic solution, Place 1 drop into both eyes every 6 (six) hours. Day before injection, day of injection and day after injection, Disp: , Rfl:    triamcinolone  cream (KENALOG ) 0.1 %, APPLY TOPICALLY TO THE AFFECTED AREA TWICE DAILY, Disp: 80 g, Rfl: 0   vortioxetine  HBr (TRINTELLIX ) 5 MG TABS tablet, Take 10 mg by mouth daily., Disp: , Rfl:    ALPRAZolam  (XANAX ) 0.5 MG tablet, Take 2 tablets (1 mg total) by mouth 2 (two) times daily., Disp: 90 tablet, Rfl: 2   ibuprofen (ADVIL) 200 MG tablet, Take 200 mg by mouth daily. 1-2 tablets daily (Patient not taking: Reported on 09/14/2023), Disp: , Rfl:    Omega-3 Fatty Acids (FISH OIL) 1200 MG CAPS, Take 1 capsule by mouth daily. (Patient not taking: Reported on 09/14/2023), Disp: , Rfl:    tiZANidine  (ZANAFLEX ) 4 MG tablet, Take 1 tablet (4 mg total) by mouth 3 (three) times daily as needed., Disp: 180 tablet, Rfl: 0  Review of Systems:  Negative  unless indicated in HPI.   Physical Exam: Vitals:   09/14/23 1414  BP: 102/68  Pulse: 67  Temp: 98.2 F (36.8 C)  TempSrc: Oral  SpO2: 94%    There is no height or weight on file to calculate BMI.   Physical Exam   Impression and Plan:  Type 2 diabetes mellitus with other specified complication, without long-term current use of insulin  (HCC) -     POCT glycosylated hemoglobin (Hb A1C)  Anxiety -     ALPRAZolam ; Take 2 tablets (1 mg total) by mouth 2 (two) times daily.  Dispense: 90 tablet; Refill: 2  Bilateral lower extremity edema  Spinal stenosis, lumbar region, with neurogenic claudication -     tiZANidine  HCl; Take 1 tablet (4 mg total) by mouth 3 (three) times daily as needed.  Dispense: 180 tablet; Refill: 0  Frequent falls -     AMB Referral [facility] Care Management   - A1c of 5.8 demonstrates excellent diabetic management. - Suspect her frequent falls are due to a combination of age, deconditioning, spinal stenosis, polypharmacy.  Agree with ALF/SNF placement.  Will fill out [place] 2 form and place referral for social work for assistance.  Time spent:31 minutes reviewing chart, interviewing and examining patient and formulating plan of care.     Tully Theophilus Andrews, MD [facility]

## 2023-09-14 NOTE — Procedures (Signed)
 The patient arrived to the [facility], stating that she is scheduled to undergo a HSAT in the near furture and needed to be prefitted for a CPAP mask. The patient was fitted with a [employer] (Small) and the [employer] (Small) . The patient could not tolerated the [facility], stating that it felt as if she needed more air which subsequently caused a panic attack. The tech switched the mask to the F30I, which the patient stated that it was comfortable and that she feels like she could tolerate the mask better than any that she has tried in the past. The F3I was given to the patient for home use.  -VB

## 2023-09-15 ENCOUNTER — Telehealth: Payer: Self-pay | Admitting: *Deleted

## 2023-09-15 NOTE — Progress Notes (Signed)
 Complex Care Management Note Care Guide Note  09/15/2023 Name: Katie Booker MRN: [id number] DOB: 1946-01-04   Complex Care Management Outreach Attempts: An unsuccessful telephone outreach was attempted today to offer the patient information about available complex care management services.  Follow Up Plan:  Additional outreach attempts will be made to offer the patient complex care management information and services.   Encounter Outcome:  No Answer  Thedford Franks, CMA   [facility], [facility] Guide Direct Dial: [phone]  Fax: [phone] Website: [email or web address]

## 2023-09-16 NOTE — Progress Notes (Signed)
 Complex Care Management Note Care Guide Note  09/16/2023 Name: Katie Booker MRN: [id number] DOB: 03/03/1945   Complex Care Management Outreach Attempts: A second unsuccessful outreach was attempted today to offer the patient with information about available complex care management services.  Follow Up Plan:  Additional outreach attempts will be made to offer the patient complex care management information and services.   Encounter Outcome:  No Answer  Thedford Franks, CMA   [facility], [facility] Guide Direct Dial: [phone]  Fax: [phone] Website: [email or web address]

## 2023-09-19 NOTE — Progress Notes (Signed)
 Complex Care Management Note Care Guide Note  09/19/2023 Name: Katie Booker MRN: [id number] DOB: October 28, 1945   Complex Care Management Outreach Attempts: A third unsuccessful outreach was attempted today to offer the patient with information about available complex care management services.  Follow Up Plan:  No further outreach attempts will be made at this time. We have been unable to contact the patient to offer or enroll patient in complex care management services.  Encounter Outcome:  No Answer  Thedford Franks, CMA [place]  [facility], [facility] Guide Direct Dial: [phone]  Fax: [phone] Website: [email or web address]

## 2023-09-20 ENCOUNTER — Telehealth: Payer: Self-pay | Admitting: *Deleted

## 2023-09-20 NOTE — Progress Notes (Signed)
 Complex Care Management Note  Care Guide Note 09/20/2023 Name: Katie Booker MRN: [id number] DOB: [birth year]  [name] is a 78 y.o. year old female who sees Theophilus Andrews, Tully GRADE, MD for primary care. I reached out to Reena Jenkins Bihari by phone today to offer complex care management services.  Ms. Docter was given information about Complex Care Management services today including:   The Complex Care Management services include support from the care team which includes your Nurse Care Manager, Clinical Social Worker, or Pharmacist.  The Complex Care Management team is here to help remove barriers to the health concerns and goals most important to you. Complex Care Management services are voluntary, and the patient may decline or stop services at any time by request to their care team member.   Complex Care Management Consent Status: Patient agreed to services and verbal consent obtained.   Follow up plan:  Telephone appointment with complex care management team member scheduled for:  09/26/2023  Encounter Outcome:  Patient Scheduled  Thedford Franks, CMA [place]  [facility], [facility] Guide Direct Dial: [phone]  Fax: [phone] Website: [email or web address]

## 2023-09-21 ENCOUNTER — Ambulatory Visit

## 2023-09-23 ENCOUNTER — Ambulatory Visit: Admitting: Obstetrics and Gynecology

## 2023-09-26 ENCOUNTER — Telehealth: Payer: Self-pay | Admitting: Licensed Clinical Social Worker

## 2023-10-03 ENCOUNTER — Other Ambulatory Visit: Payer: Self-pay

## 2023-10-03 ENCOUNTER — Emergency Department (HOSPITAL_COMMUNITY)
Admission: EM | Admit: 2023-10-03 | Discharge: 2023-10-03 | Disposition: A | Discharge status: Home / Self Care | Attending: Emergency Medicine | Admitting: Emergency Medicine

## 2023-10-03 DIAGNOSIS — M549 Dorsalgia, unspecified: Secondary | ICD-10-CM | POA: Diagnosis not present

## 2023-10-03 DIAGNOSIS — M79604 Pain in right leg: Secondary | ICD-10-CM | POA: Insufficient documentation

## 2023-10-03 DIAGNOSIS — R531 Weakness: Secondary | ICD-10-CM | POA: Diagnosis not present

## 2023-10-03 DIAGNOSIS — M79605 Pain in left leg: Secondary | ICD-10-CM | POA: Insufficient documentation

## 2023-10-03 DIAGNOSIS — R2689 Other abnormalities of gait and mobility: Secondary | ICD-10-CM | POA: Insufficient documentation

## 2023-10-03 DIAGNOSIS — R2681 Unsteadiness on feet: Secondary | ICD-10-CM

## 2023-10-03 DIAGNOSIS — R202 Paresthesia of skin: Secondary | ICD-10-CM | POA: Insufficient documentation

## 2023-10-03 DIAGNOSIS — M545 Low back pain, unspecified: Secondary | ICD-10-CM | POA: Diagnosis not present

## 2023-10-03 DIAGNOSIS — G8929 Other chronic pain: Secondary | ICD-10-CM | POA: Diagnosis not present

## 2023-10-03 LAB — CBC WITH DIFFERENTIAL/PLATELET
Abs Immature Granulocytes: 0.02 K/uL (ref 0.00–0.07)
Basophils Absolute: 0 K/uL (ref 0.0–0.1)
Basophils Relative: 0 %
Eosinophils Absolute: 0.1 K/uL (ref 0.0–0.5)
Eosinophils Relative: 1 %
HCT: 36.5 % (ref 36.0–46.0)
Hemoglobin: 11.7 g/dL — ABNORMAL LOW (ref 12.0–15.0)
Immature Granulocytes: 0 %
Lymphocytes Relative: 23 %
Lymphs Abs: 1.7 K/uL (ref 0.7–4.0)
MCH: 27.9 pg (ref 26.0–34.0)
MCHC: 32.1 g/dL (ref 30.0–36.0)
MCV: 86.9 fL (ref 80.0–100.0)
Monocytes Absolute: 0.7 K/uL (ref 0.1–1.0)
Monocytes Relative: 8 %
Neutro Abs: 5.2 K/uL (ref 1.7–7.7)
Neutrophils Relative %: 68 %
Platelets: 219 K/uL (ref 150–400)
RBC: 4.2 MIL/uL (ref 3.87–5.11)
RDW: 14.1 % (ref 11.5–15.5)
WBC: 7.7 K/uL (ref 4.0–10.5)
nRBC: 0 % (ref 0.0–0.2)

## 2023-10-03 LAB — BASIC METABOLIC PANEL WITH GFR
Anion gap: 11 (ref 5–15)
BUN: 33 mg/dL — ABNORMAL HIGH (ref 8–23)
CO2: 27 mmol/L (ref 22–32)
Calcium: 9.7 mg/dL (ref 8.9–10.3)
Chloride: 103 mmol/L (ref 98–111)
Creatinine, Ser: 1.02 mg/dL — ABNORMAL HIGH (ref 0.44–1.00)
GFR, Estimated: 57 mL/min — ABNORMAL LOW (ref >60)
Glucose, Bld: 101 mg/dL — ABNORMAL HIGH (ref 70–99)
Potassium: 3.8 mmol/L (ref 3.5–5.1)
Sodium: 141 mmol/L (ref 135–145)

## 2023-10-03 MED ORDER — PRAMIPEXOLE DIHYDROCHLORIDE 0.125 MG PO TABS
0.1250 mg | ORAL_TABLET | Freq: Once | ORAL | Status: AC
Start: 2023-10-03 — End: 2023-10-03
  Administered 2023-10-03 (×2): 0.125 mg via ORAL
  Filled 2023-10-03: qty 1

## 2023-10-03 NOTE — Discharge Planning (Signed)
 Pt currently active with [facility] for Home Health services RN as confirmed by [facility] with [facility].  Pt will resume HH services of RN. No DME needs identified at this time.  Edker Punt J. Debarah, BSN, RN, [facility]  IP Care Management  Nurse Case Manager  [facility] Emergency Departments  Operative Services  [phone]

## 2023-10-03 NOTE — ED Provider Notes (Signed)
 [facility] Emergency Department Provider Note MRN:  [id number]  Arrival date & time: 10/03/23     Chief Complaint   Leg Pain   History of Present Illness   Katie Booker is a 78 y.o. year-old female presents to the ED with chief complaint of increased lower extremity weakness and gait instability over the past month.  She states that she walks with a rollator.  She states that she has had falls and is concerned about her stability.  She states that she would like to get into physical therapy.  She states that she has intermittent numbness and tingling, but reports that this is not new.  She states that her symptoms have just worsened over the past month.  Contrary to nursing triage note, patient reiterates that her symptoms have been worse for about a month.  There has not been a significant acute change.  History provided by patient.   Review of Systems  Pertinent positive and negative review of systems noted in HPI.    Physical Exam   Vitals:   10/03/23 0543 10/03/23 0750  BP: 135/74 132/80  Pulse: 93 90  Resp: 18 15  Temp: 98 F (36.7 C) 98 F (36.7 C)  SpO2: 97% 98%    CONSTITUTIONAL:  non toxic-appearing, NAD NEURO:  Alert and oriented x 3, CN 3-12 grossly intact EYES:  eyes equal and reactive ENT/NECK:  Supple, no stridor  CARDIO:  normal rate, regular rhythm, appears well-perfused  PULM:  No respiratory distress, CTAB GI/GU:  non-distended,  MSK/SPINE:  No gross deformities, no edema, moves all extremities  SKIN:  no rash, atraumatic   *Additional and/or pertinent findings included in MDM below  Diagnostic and Interventional Summary    EKG Interpretation Date/Time:    Ventricular Rate:    PR Interval:    QRS Duration:    QT Interval:    QTC Calculation:   R Axis:      Text Interpretation:         Labs Reviewed  CBC WITH DIFFERENTIAL/PLATELET - Abnormal; Notable for the following components:      Result Value    Hemoglobin 11.7 (*)    All other components within normal limits  BASIC METABOLIC PANEL WITH GFR - Abnormal; Notable for the following components:   Glucose, Bld 101 (*)    BUN 33 (*)    Creatinine, Ser 1.02 (*)    GFR, Estimated 57 (*)    All other components within normal limits    No orders to display    Medications  pramipexole  (MIRAPEX ) tablet 0.125 mg (0.125 mg Oral Given 10/03/23 0603)     Procedures  /  Critical Care Procedures  ED Course and Medical Decision Making  I have reviewed the triage vital signs, the nursing notes, and pertinent available records from the EMR.  Social Determinants Affecting Complexity of Care: Patient has no clinically significant social determinants affecting this chief complaint..   ED Course: Clinical Course as of 10/03/23 2158  Mon Oct 03, 2023  0134 Chronic weakness. Felt unsafe tonight. She feels ok with discharge tonight after evaluation. Vital signs grossly reassuring, patient is in no acute distress.  [CC]  0142 Evaluated at bedside. Symptoms resolved. Has been lost to follow up with PCP.  [CC]    Clinical Course User Index [CC] Jerral Meth, MD    Medical Decision Making Patient here complaining of about a months worth of gait instability.  She states that she is  interested in attending physical therapy, but has not been able to go because of pain.  She now feels like she would be able to go, but does not know how to get in touch.  She told nursing that her symptoms had worsened as far as instability in the past few days, but tells me repeatedly that her symptoms have been ongoing for about a month.  Patient initially agreeable with waiting for labs and urine, but then changed her mind and requested discharge.    Patient seen by and discussed with Dr. Jerral.  Amount and/or Complexity of Data Reviewed Labs: ordered.  Risk Prescription drug management.         Consultants: No consultations were needed in  caring for this patient.   Treatment and Plan: Emergency department workup does not suggest an emergent condition requiring admission or immediate intervention beyond  what has been performed at this time. The patient is safe for discharge and has  been instructed to return immediately for worsening symptoms, change in  symptoms or any other concerns    Final Clinical Impressions(s) / ED Diagnoses     ICD-10-CM   1. Chronic pain of both lower extremities  M79.604    M79.605    G89.29     2. Gait instability  R26.81       ED Discharge Orders     None         Discharge Instructions Discussed with and Provided to Patient:   Discharge Instructions   None      [name], [name] 10/03/23 2159    Jerral Meth, MD 10/03/23 2258

## 2023-10-03 NOTE — Progress Notes (Signed)
 RNCM to arrange Home Health/DME.

## 2023-10-03 NOTE — ED Notes (Signed)
 Patients family picking patient up.

## 2023-10-03 NOTE — ED Triage Notes (Signed)
 Pt to Ed by EMS from home with c/o bilateral Lower extremity weakness. Pt has become increasingly weak over the past 3 days. EMS reports multiple falls over the past month. Pt intermittently confused. VSS, NADN.

## 2023-10-04 ENCOUNTER — Other Ambulatory Visit: Payer: Self-pay

## 2023-10-04 ENCOUNTER — Emergency Department (HOSPITAL_COMMUNITY)

## 2023-10-04 ENCOUNTER — Ambulatory Visit: Admitting: Obstetrics and Gynecology

## 2023-10-04 ENCOUNTER — Emergency Department (HOSPITAL_COMMUNITY)
Admission: EM | Admit: 2023-10-04 | Discharge: 2023-10-05 | Disposition: A | Discharge status: Home / Self Care | Attending: Emergency Medicine | Admitting: Emergency Medicine

## 2023-10-04 DIAGNOSIS — J9811 Atelectasis: Secondary | ICD-10-CM | POA: Diagnosis not present

## 2023-10-04 DIAGNOSIS — R531 Weakness: Secondary | ICD-10-CM | POA: Diagnosis not present

## 2023-10-04 DIAGNOSIS — R296 Repeated falls: Secondary | ICD-10-CM | POA: Insufficient documentation

## 2023-10-04 DIAGNOSIS — G8929 Other chronic pain: Secondary | ICD-10-CM | POA: Insufficient documentation

## 2023-10-04 DIAGNOSIS — M47817 Spondylosis without myelopathy or radiculopathy, lumbosacral region: Secondary | ICD-10-CM | POA: Diagnosis not present

## 2023-10-04 DIAGNOSIS — E039 Hypothyroidism, unspecified: Secondary | ICD-10-CM | POA: Insufficient documentation

## 2023-10-04 DIAGNOSIS — M48061 Spinal stenosis, lumbar region without neurogenic claudication: Secondary | ICD-10-CM

## 2023-10-04 DIAGNOSIS — R6889 Other general symptoms and signs: Secondary | ICD-10-CM | POA: Diagnosis not present

## 2023-10-04 DIAGNOSIS — M48062 Spinal stenosis, lumbar region with neurogenic claudication: Secondary | ICD-10-CM | POA: Insufficient documentation

## 2023-10-04 DIAGNOSIS — M4805 Spinal stenosis, thoracolumbar region: Secondary | ICD-10-CM | POA: Diagnosis not present

## 2023-10-04 DIAGNOSIS — E119 Type 2 diabetes mellitus without complications: Secondary | ICD-10-CM | POA: Diagnosis not present

## 2023-10-04 DIAGNOSIS — R001 Bradycardia, unspecified: Secondary | ICD-10-CM | POA: Diagnosis not present

## 2023-10-04 DIAGNOSIS — Z79899 Other long term (current) drug therapy: Secondary | ICD-10-CM | POA: Diagnosis not present

## 2023-10-04 DIAGNOSIS — I6782 Cerebral ischemia: Secondary | ICD-10-CM | POA: Diagnosis not present

## 2023-10-04 DIAGNOSIS — M47814 Spondylosis without myelopathy or radiculopathy, thoracic region: Secondary | ICD-10-CM | POA: Diagnosis not present

## 2023-10-04 DIAGNOSIS — M545 Low back pain, unspecified: Secondary | ICD-10-CM | POA: Diagnosis not present

## 2023-10-04 DIAGNOSIS — W19XXXA Unspecified fall, initial encounter: Secondary | ICD-10-CM | POA: Diagnosis not present

## 2023-10-04 DIAGNOSIS — M4807 Spinal stenosis, lumbosacral region: Secondary | ICD-10-CM | POA: Diagnosis not present

## 2023-10-04 MED ORDER — HYDROCODONE-ACETAMINOPHEN 5-325 MG PO TABS
2.0000 | ORAL_TABLET | Freq: Once | ORAL | Status: AC
  Administered 2023-10-04 (×2): 2 via ORAL
  Filled 2023-10-04: qty 2

## 2023-10-04 NOTE — ED Triage Notes (Signed)
 Patient BIB [facility] from home for multiple falls including one today, no injuries. Family wants placement in a SNF. Patient here yesterday for same but requested discharge with the plan for home health.

## 2023-10-04 NOTE — ED Notes (Signed)
 Patient transported to X-ray

## 2023-10-04 NOTE — ED Provider Notes (Signed)
 [place] EMERGENCY DEPARTMENT AT [facility] Provider Note   CSN: [id number] Arrival date & time: 10/04/23  1936     Patient presents with: [place]   Katie Booker is a 79 y.o. female.  {Add pertinent medical, surgical, social history, OB history to YEP:67052} Patient with a history of diabetes, hypothyroidism, hyperlipidemia, anxiety, chronic neck and back pain, neuropathy presents with recurrent falls over the past month.  She was seen yesterday for same.  She is now requesting placement.  Home health was arranged yesterday but she states she cannot take care of herself at home.  Did have a fall today but denies any injuries.  Complains of pain to her low back which is chronic but worsening over the past month or so.  Feels weak in her bilateral legs.  Denies headache, chest pain, shortness of breath, abdominal pain, nausea or vomiting or fever.  She has had several episodes of urinary incontinence over the past month.  Her weakness to her legs seems to stem from the knee down bilaterally.  Denies any abdominal pain currently.  Has chronic back pain for which she takes hydrocodone .  States she fell today but did not suffer any injuries.  Denies head or neck pain.  No chest pain or shortness of breath.  No worsening dizziness or lightheadedness.  The history is provided by the patient.  Fall Pertinent negatives include no chest pain, no abdominal pain, no headaches and no shortness of breath.       Prior to Admission medications   Medication Sig Start Date End Date Taking? Authorizing Provider  ALPRAZolam  (XANAX ) 0.5 MG tablet Take 2 tablets (1 mg total) by mouth 2 (two) times daily. 09/14/23   Theophilus Andrews, Tully GRADE, MD  atenolol  (TENORMIN ) 25 MG tablet TAKE 1 TABLET BY MOUTH DAILY 08/30/22   Theophilus Andrews, Tully GRADE, MD  atorvastatin  (LIPITOR) 40 MG tablet TAKE 1 TABLET BY MOUTH DAILY 03/03/23   Theophilus Andrews, Tully GRADE, MD  calcium  citrate (CALCITRATE - DOSED IN MG  ELEMENTAL CALCIUM ) 950 (200 Ca) MG tablet Take 200 mg of elemental calcium  by mouth daily. Patient not taking: Reported on 10/03/2023    [provider]  Carboxymethylcell-Hypromellose (GENTEAL OP) Apply to eye.    [provider]  Carboxymethylcellulose Sodium (LUBRICANT EYE DROPS OP) Apply 2 drops to eye 4 (four) times daily as needed (dry eyes).     [provider]  citalopram  (CELEXA ) 20 MG tablet Take 20 mg by mouth daily. 08/31/23   [provider]  estradiol  (ESTRACE ) 0.1 MG/GM vaginal cream Place 0.5 g vaginally 2 (two) times a week. Place 0.5g nightly for two weeks then twice a week after 02/24/23   Zuleta, Kaitlin G, NP  furosemide  (LASIX ) 20 MG tablet TAKE 1 TABLET BY MOUTH DAILY FOR 5 DAYS. Patient not taking: Reported on 10/03/2023 08/31/23   Theophilus Andrews, Tully GRADE, MD  HYDROcodone -acetaminophen  (NORCO) 7.5-325 MG tablet Take 1 tablet by mouth every 6 (six) hours as needed for moderate pain (pain score 4-6). 07/20/23   Theophilus Andrews, Tully GRADE, MD  hydrocortisone  2.5 % cream APPLY TO AFFECTED AREA(S)  TOPICALLY TWICE DAILY Patient not taking: Reported on 10/03/2023 08/30/22   Theophilus Andrews, Tully GRADE, MD  ibuprofen (ADVIL) 200 MG tablet Take 200 mg by mouth daily. 1-2 tablets daily Patient not taking: No sig reported    [provider]  Lancets (ONETOUCH ULTRASOFT) lancets Use daily 11/12/11   Jame Maude FALCON, MD  levothyroxine  (  SYNTHROID ) 125 MCG tablet TAKE 1 TABLET BY MOUTH DAILY 08/18/23   Theophilus Andrews, Tully GRADE, MD  lisinopril  (ZESTRIL ) 10 MG tablet Take 10 mg by mouth daily. 08/16/23   [provider]  meloxicam  (MOBIC ) 15 MG tablet TAKE 1 TABLET BY MOUTH DAILY 08/18/23   Theophilus Andrews, Tully GRADE, MD  metFORMIN  (GLUCOPHAGE ) 1000 MG tablet TAKE 1 TABLET BY MOUTH TWICE  DAILY WITH MEALS 07/19/23   Theophilus Andrews, Tully GRADE, MD  Multiple Vitamin (MULTIVITAMIN) tablet Take 1 tablet by mouth daily.    [provider]   Omega-3 Fatty Acids (FISH OIL) 1200 MG CAPS Take 1 capsule by mouth daily. Patient not taking: Reported on 07/11/2023    [provider]  ONE TOUCH ULTRA TEST test strip USE 1 DAILY AS DIRECTED 05/12/17   Jame Maude FALCON, MD  pantoprazole  (PROTONIX ) 40 MG tablet TAKE 1 TABLET BY MOUTH TWICE  DAILY BEFORE MEALS 07/06/23   Theophilus Andrews, Tully GRADE, MD  pioglitazone  (ACTOS ) 15 MG tablet TAKE 1 TABLET BY MOUTH DAILY 03/03/23   Theophilus Andrews, Tully GRADE, MD  pramipexole  (MIRAPEX ) 0.125 MG tablet TAKE 1 TABLET(0.125 MG) BY MOUTH AT BEDTIME 08/31/23   Theophilus Andrews, Tully GRADE, MD  pregabalin  (LYRICA ) 75 MG capsule TAKE 1 CAPSULE(75 MG) BY MOUTH TWICE DAILY 08/31/23   Theophilus Andrews, Tully GRADE, MD  tiZANidine  (ZANAFLEX ) 4 MG tablet Take 1 tablet (4 mg total) by mouth 3 (three) times daily as needed. 09/14/23   Theophilus Andrews, Tully GRADE, MD  tobramycin  (TOBREX ) 0.3 % ophthalmic solution Place 1 drop into both eyes every 6 (six) hours. Day before injection, day of injection and day after injection 11/26/19   [provider]  triamcinolone  cream (KENALOG ) 0.1 % APPLY TOPICALLY TO THE AFFECTED AREA TWICE DAILY 08/31/23   Theophilus Andrews, Tully GRADE, MD  vortioxetine  HBr (TRINTELLIX ) 5 MG TABS tablet Take 5 mg by mouth daily.    [provider]    Allergies: Duraprep [antiseptic products, misc.]; Advil allergy sinus [chlorpheniramine-pse-ibuprofen]; Betadine [povidone iodine]; Hydromorphone  hcl; and Morphine  and codeine    Review of Systems  Constitutional:  Negative for activity change, appetite change and fever.  HENT:  Negative for congestion and rhinorrhea.   Respiratory:  Negative for cough, chest tightness and shortness of breath.   Cardiovascular:  Negative for chest pain.  Gastrointestinal:  Negative for abdominal pain, nausea and vomiting.  Genitourinary:  Negative for dysuria and hematuria.  Musculoskeletal:  Positive for arthralgias, back pain and myalgias.  Skin:   Negative for rash.  Neurological:  Positive for weakness. Negative for dizziness, light-headedness and headaches.   all other systems are negative except as noted in the HPI and PMH.    Updated Vital Signs BP 109/63 (BP Location: Right Arm)   Pulse 70   Temp 97.7 F (36.5 C)   Resp 16   SpO2 93%   Physical Exam Vitals and nursing note reviewed.  Constitutional:      General: She is not in acute distress.    Appearance: She is well-developed.  HENT:     Head: Normocephalic and atraumatic.     Mouth/Throat:     Pharynx: No oropharyngeal exudate.  Eyes:     Conjunctiva/sclera: Conjunctivae normal.     Pupils: Pupils are equal, round, and reactive to light.  Neck:     Comments: No meningismus. Cardiovascular:     Rate and Rhythm: Normal rate and regular rhythm.     Heart sounds: Normal heart  sounds. No murmur heard. Pulmonary:     Effort: Pulmonary effort is normal. No respiratory distress.     Breath sounds: Normal breath sounds.  Chest:     Chest wall: No tenderness.  Abdominal:     Palpations: Abdomen is soft.     Tenderness: There is no abdominal tenderness. There is no guarding or rebound.     Hernia: A hernia is present.     Comments: Reducible umbilical hernia  Musculoskeletal:        General: Tenderness present. Normal range of motion.     Cervical back: Normal range of motion and neck supple.     Comments: Paraspinal lumbar tenderness bilaterally.  4/5 strength in bilateral lower extremities. Ankle plantar and dorsiflexion intact. Great toe extension intact bilaterally. +2 DP and PT pulses. L great toe amputation well healed  Skin:    General: Skin is warm.  Neurological:     Mental Status: She is alert and oriented to person, place, and time.     Cranial Nerves: No cranial nerve deficit.     Motor: No abnormal muscle tone.     Coordination: Coordination normal.     Comments:  5/5 strength throughout. CN 2-12 intact.Equal grip strength.   Psychiatric:         Behavior: Behavior normal.     (all labs ordered are listed, but only abnormal results are displayed) Labs Reviewed  URINALYSIS, ROUTINE W REFLEX MICROSCOPIC  COMPREHENSIVE METABOLIC PANEL WITH GFR  CBC WITH DIFFERENTIAL/PLATELET    EKG: None  Radiology: No results found.  {Document cardiac monitor, telemetry assessment procedure when appropriate:32947} Procedures   Medications Ordered in the ED  HYDROcodone -acetaminophen  (NORCO/VICODIN) 5-325 MG per tablet 2 tablet (has no administration in time range)      {Click here for ABCD2, HEART and other calculators REFRESH Note before signing:1}                              Medical Decision Making Amount and/or Complexity of Data Reviewed Labs: ordered. Decision-making details documented in ED Course. Radiology: ordered and independent interpretation performed. Decision-making details documented in ED Course. ECG/medicine tests: ordered and independent interpretation performed. Decision-making details documented in ED Course.  Risk Prescription drug management.   Recurrent falls over the past 1 month with generalized weakness.  Ongoing back pain for 1 month with weakness to her legs laterally.  Able to lift legs off the bed bilaterally without deficit.  {Document critical care time when appropriate  Document review of labs and clinical decision tools ie CHADS2VASC2, etc  Document your independent review of radiology images and any outside records  Document your discussion with family members, caretakers and with consultants  Document social determinants of health affecting pt's care  Document your decision making why or why not admission, treatments were needed:32947:::1}   Final diagnoses:  None    ED Discharge Orders     None

## 2023-10-05 ENCOUNTER — Emergency Department (HOSPITAL_COMMUNITY)

## 2023-10-05 ENCOUNTER — Telehealth: Payer: Self-pay | Admitting: Licensed Clinical Social Worker

## 2023-10-05 DIAGNOSIS — E119 Type 2 diabetes mellitus without complications: Secondary | ICD-10-CM | POA: Diagnosis not present

## 2023-10-05 DIAGNOSIS — Z7401 Bed confinement status: Secondary | ICD-10-CM | POA: Diagnosis not present

## 2023-10-05 DIAGNOSIS — M4805 Spinal stenosis, thoracolumbar region: Secondary | ICD-10-CM | POA: Diagnosis not present

## 2023-10-05 DIAGNOSIS — Z7984 Long term (current) use of oral hypoglycemic drugs: Secondary | ICD-10-CM | POA: Diagnosis not present

## 2023-10-05 DIAGNOSIS — M4712 Other spondylosis with myelopathy, cervical region: Secondary | ICD-10-CM | POA: Diagnosis not present

## 2023-10-05 DIAGNOSIS — K5900 Constipation, unspecified: Secondary | ICD-10-CM | POA: Diagnosis not present

## 2023-10-05 DIAGNOSIS — I6782 Cerebral ischemia: Secondary | ICD-10-CM | POA: Diagnosis not present

## 2023-10-05 DIAGNOSIS — K219 Gastro-esophageal reflux disease without esophagitis: Secondary | ICD-10-CM | POA: Diagnosis not present

## 2023-10-05 DIAGNOSIS — G8929 Other chronic pain: Secondary | ICD-10-CM | POA: Diagnosis not present

## 2023-10-05 DIAGNOSIS — R296 Repeated falls: Secondary | ICD-10-CM | POA: Diagnosis not present

## 2023-10-05 DIAGNOSIS — E039 Hypothyroidism, unspecified: Secondary | ICD-10-CM | POA: Diagnosis not present

## 2023-10-05 DIAGNOSIS — Z9181 History of falling: Secondary | ICD-10-CM | POA: Diagnosis not present

## 2023-10-05 DIAGNOSIS — B399 Histoplasmosis, unspecified: Secondary | ICD-10-CM | POA: Diagnosis not present

## 2023-10-05 DIAGNOSIS — Z743 Need for continuous supervision: Secondary | ICD-10-CM | POA: Diagnosis not present

## 2023-10-05 DIAGNOSIS — Z79899 Other long term (current) drug therapy: Secondary | ICD-10-CM | POA: Diagnosis not present

## 2023-10-05 DIAGNOSIS — H32 Chorioretinal disorders in diseases classified elsewhere: Secondary | ICD-10-CM | POA: Diagnosis not present

## 2023-10-05 DIAGNOSIS — I10 Essential (primary) hypertension: Secondary | ICD-10-CM | POA: Diagnosis not present

## 2023-10-05 DIAGNOSIS — N390 Urinary tract infection, site not specified: Secondary | ICD-10-CM | POA: Diagnosis not present

## 2023-10-05 DIAGNOSIS — M549 Dorsalgia, unspecified: Secondary | ICD-10-CM | POA: Diagnosis not present

## 2023-10-05 DIAGNOSIS — G4733 Obstructive sleep apnea (adult) (pediatric): Secondary | ICD-10-CM | POA: Diagnosis not present

## 2023-10-05 DIAGNOSIS — R531 Weakness: Secondary | ICD-10-CM | POA: Diagnosis not present

## 2023-10-05 DIAGNOSIS — G629 Polyneuropathy, unspecified: Secondary | ICD-10-CM | POA: Diagnosis not present

## 2023-10-05 DIAGNOSIS — M4807 Spinal stenosis, lumbosacral region: Secondary | ICD-10-CM | POA: Diagnosis not present

## 2023-10-05 DIAGNOSIS — E785 Hyperlipidemia, unspecified: Secondary | ICD-10-CM | POA: Diagnosis not present

## 2023-10-05 DIAGNOSIS — R2681 Unsteadiness on feet: Secondary | ICD-10-CM | POA: Diagnosis not present

## 2023-10-05 DIAGNOSIS — G2581 Restless legs syndrome: Secondary | ICD-10-CM | POA: Diagnosis not present

## 2023-10-05 DIAGNOSIS — M47814 Spondylosis without myelopathy or radiculopathy, thoracic region: Secondary | ICD-10-CM | POA: Diagnosis not present

## 2023-10-05 DIAGNOSIS — M4316 Spondylolisthesis, lumbar region: Secondary | ICD-10-CM | POA: Diagnosis not present

## 2023-10-05 DIAGNOSIS — D649 Anemia, unspecified: Secondary | ICD-10-CM | POA: Diagnosis not present

## 2023-10-05 DIAGNOSIS — E11621 Type 2 diabetes mellitus with foot ulcer: Secondary | ICD-10-CM | POA: Diagnosis not present

## 2023-10-05 DIAGNOSIS — M47817 Spondylosis without myelopathy or radiculopathy, lumbosacral region: Secondary | ICD-10-CM | POA: Diagnosis not present

## 2023-10-05 DIAGNOSIS — M6281 Muscle weakness (generalized): Secondary | ICD-10-CM | POA: Diagnosis not present

## 2023-10-05 DIAGNOSIS — M86172 Other acute osteomyelitis, left ankle and foot: Secondary | ICD-10-CM | POA: Diagnosis not present

## 2023-10-05 LAB — COMPREHENSIVE METABOLIC PANEL WITH GFR
ALT: 25 U/L (ref 0–44)
AST: 29 U/L (ref 15–41)
Albumin: 3.8 g/dL (ref 3.5–5.0)
Alkaline Phosphatase: 87 U/L (ref 38–126)
Anion gap: 13 (ref 5–15)
BUN: 32 mg/dL — ABNORMAL HIGH (ref 8–23)
CO2: 25 mmol/L (ref 22–32)
Calcium: 9.5 mg/dL (ref 8.9–10.3)
Chloride: 104 mmol/L (ref 98–111)
Creatinine, Ser: 1.33 mg/dL — ABNORMAL HIGH (ref 0.44–1.00)
GFR, Estimated: 41 mL/min — ABNORMAL LOW (ref >60)
Glucose, Bld: 101 mg/dL — ABNORMAL HIGH (ref 70–99)
Potassium: 4.5 mmol/L (ref 3.5–5.1)
Sodium: 142 mmol/L (ref 135–145)
Total Bilirubin: 0.6 mg/dL (ref 0.0–1.2)
Total Protein: 6.6 g/dL (ref 6.5–8.1)

## 2023-10-05 LAB — CBG MONITORING, ED
Glucose-Capillary: 82 mg/dL (ref 70–99)
Glucose-Capillary: 97 mg/dL (ref 70–99)
Glucose-Capillary: 110 mg/dL — ABNORMAL HIGH (ref 70–99)

## 2023-10-05 LAB — CBC WITH DIFFERENTIAL/PLATELET
Abs Immature Granulocytes: 0.01 K/uL (ref 0.00–0.07)
Basophils Absolute: 0 K/uL (ref 0.0–0.1)
Basophils Relative: 1 %
Eosinophils Absolute: 0.2 K/uL (ref 0.0–0.5)
Eosinophils Relative: 3 %
HCT: 37.9 % (ref 36.0–46.0)
Hemoglobin: 12.1 g/dL (ref 12.0–15.0)
Immature Granulocytes: 0 %
Lymphocytes Relative: 35 %
Lymphs Abs: 1.9 K/uL (ref 0.7–4.0)
MCH: 28 pg (ref 26.0–34.0)
MCHC: 31.9 g/dL (ref 30.0–36.0)
MCV: 87.7 fL (ref 80.0–100.0)
Monocytes Absolute: 0.3 K/uL (ref 0.1–1.0)
Monocytes Relative: 6 %
Neutro Abs: 3.1 K/uL (ref 1.7–7.7)
Neutrophils Relative %: 55 %
Platelets: 213 K/uL (ref 150–400)
RBC: 4.32 MIL/uL (ref 3.87–5.11)
RDW: 14.2 % (ref 11.5–15.5)
WBC: 5.5 K/uL (ref 4.0–10.5)
nRBC: 0 % (ref 0.0–0.2)

## 2023-10-05 LAB — URINALYSIS, ROUTINE W REFLEX MICROSCOPIC
Bilirubin Urine: NEGATIVE
Glucose, UA: NEGATIVE mg/dL
Hgb urine dipstick: NEGATIVE
Ketones, ur: NEGATIVE mg/dL
Nitrite: NEGATIVE
Protein, ur: NEGATIVE mg/dL
Specific Gravity, Urine: 1.01 (ref 1.005–1.030)
WBC, UA: >50 WBC/hpf (ref 0–5)
pH: 5 (ref 5.0–8.0)

## 2023-10-05 MED ORDER — PIOGLITAZONE HCL 15 MG PO TABS
15.0000 mg | ORAL_TABLET | Freq: Every day | ORAL | Status: DC
  Administered 2023-10-05 (×2): 15 mg via ORAL
  Filled 2023-10-05: qty 1

## 2023-10-05 MED ORDER — PANTOPRAZOLE SODIUM 40 MG PO TBEC
40.0000 mg | DELAYED_RELEASE_TABLET | Freq: Two times a day (BID) | ORAL | Status: DC
  Administered 2023-10-05 (×4): 40 mg via ORAL
  Filled 2023-10-05 (×2): qty 1

## 2023-10-05 MED ORDER — ATENOLOL 25 MG PO TABS
25.0000 mg | ORAL_TABLET | Freq: Every day | ORAL | Status: DC
  Administered 2023-10-05 (×2): 25 mg via ORAL
  Filled 2023-10-05: qty 1

## 2023-10-05 MED ORDER — LISINOPRIL 10 MG PO TABS
10.0000 mg | ORAL_TABLET | Freq: Every day | ORAL | Status: DC
  Administered 2023-10-05 (×2): 10 mg via ORAL
  Filled 2023-10-05: qty 1

## 2023-10-05 MED ORDER — INSULIN ASPART 100 UNIT/ML IJ SOLN: 0.0000 [IU] | Freq: Three times a day (TID) | INTRAMUSCULAR | Status: DC

## 2023-10-05 MED ORDER — LORAZEPAM 1 MG PO TABS
1.0000 mg | ORAL_TABLET | Freq: Once | ORAL | Status: AC
  Administered 2023-10-05 (×2): 1 mg via ORAL
  Filled 2023-10-05: qty 1

## 2023-10-05 MED ORDER — ATORVASTATIN CALCIUM 40 MG PO TABS
40.0000 mg | ORAL_TABLET | Freq: Every day | ORAL | Status: DC
  Administered 2023-10-05 (×2): 40 mg via ORAL
  Filled 2023-10-05: qty 1

## 2023-10-05 MED ORDER — PREGABALIN 25 MG PO CAPS
75.0000 mg | ORAL_CAPSULE | Freq: Every day | ORAL | Status: DC
  Administered 2023-10-05 (×2): 75 mg via ORAL
  Filled 2023-10-05: qty 3

## 2023-10-05 MED ORDER — LEVOTHYROXINE SODIUM 25 MCG PO TABS
125.0000 ug | ORAL_TABLET | Freq: Every day | ORAL | Status: DC
  Administered 2023-10-05 (×2): 125 ug via ORAL
  Filled 2023-10-05: qty 1

## 2023-10-05 MED ORDER — METFORMIN HCL 500 MG PO TABS
1000.0000 mg | ORAL_TABLET | Freq: Two times a day (BID) | ORAL | Status: DC
  Administered 2023-10-05 (×4): 1000 mg via ORAL
  Filled 2023-10-05 (×2): qty 2

## 2023-10-05 MED ORDER — CEPHALEXIN 250 MG PO CAPS
500.0000 mg | ORAL_CAPSULE | Freq: Three times a day (TID) | ORAL | Status: DC
  Administered 2023-10-05 (×2): 500 mg via ORAL
  Filled 2023-10-05: qty 2

## 2023-10-05 MED ORDER — ALPRAZOLAM 0.25 MG PO TABS
1.0000 mg | ORAL_TABLET | Freq: Two times a day (BID) | ORAL | Status: DC
  Administered 2023-10-05 (×2): 1 mg via ORAL
  Filled 2023-10-05: qty 4

## 2023-10-05 MED ORDER — HYDROCODONE-ACETAMINOPHEN 7.5-325 MG PO TABS
1.0000 | ORAL_TABLET | Freq: Four times a day (QID) | ORAL | Status: DC | PRN
  Administered 2023-10-05 (×4): 1 via ORAL
  Filled 2023-10-05 (×2): qty 1

## 2023-10-05 MED ORDER — CITALOPRAM HYDROBROMIDE 10 MG PO TABS
20.0000 mg | ORAL_TABLET | Freq: Every day | ORAL | Status: DC
  Administered 2023-10-05 (×2): 20 mg via ORAL
  Filled 2023-10-05: qty 2

## 2023-10-05 NOTE — ED Notes (Signed)
 Patient transported to MRI

## 2023-10-05 NOTE — Evaluation (Signed)
 Physical Therapy Evaluation Patient Details Name: Katie Booker MRN: [id number] DOB: 1945/11/26 Today's Date: 10/05/2023  History of Present Illness  Pt is a 78 y.o. female who presented 10/03/23 with recurrent falls and generalized weakness. MRI brain and lumbar spine negative for acute changes. PMH includes: type 2 diabetes, peripheral neuropathy, hyperlipidemia, hypertension, restless leg syndrome, iron deficiency anemia, hypothyroidism, obesity, OSA   Clinical Impression  Pt presents with condition above and deficits mentioned below, see PT Problem List. PTA, she was mod I utilizing a rollator for functional mobility, living alone in a 1-level apartment with a level entry. She endorses falling daily in the past week. Currently, the pt is requiring mod-maxA for bed mobility, minA to transfer sit to stand, and modA to take a few lateral steps along EOB. She is currently displaying deficits in bil lower extremity strength, balance, power, and endurance. She has baseline sensory deficits in her bil lower legs and feet. She endorses a high fear of falling and is currently at risk for further falls. As the pt is demonstrating a functional decline compared to her baseline and does not have frequent assistance available to her at home, she could benefit from short-term inpatient rehab, < 3 hours/day. Pt and her daughter endorsed desire to potentially pursue ALF long-term placement after rehab. Will continue to follow acutely.        If plan is discharge home, recommend the following: Two people to help with walking and/or transfers;A lot of help with bathing/dressing/bathroom;Assistance with cooking/housework;Assist for transportation   Can travel by private vehicle   Yes    Equipment Recommendations Rolling walker (2 wheels);BSC/3in1;Wheelchair (measurements PT);Wheelchair cushion (measurements PT);Hospital bed  Recommendations for Other Services       Functional Status Assessment Patient has  had a recent decline in their functional status and demonstrates the ability to make significant improvements in function in a reasonable and predictable amount of time.     Precautions / Restrictions Precautions Precautions: Fall Recall of Precautions/Restrictions: Intact Precaution/Restrictions Comments: falls frequently Restrictions Weight Bearing Restrictions Per Provider Order: No      Mobility  Bed Mobility Overal bed mobility: Needs Assistance Bed Mobility: Supine to Sit, Sit to Supine     Supine to sit: Mod assist, HOB elevated Sit to supine: Max assist   General bed mobility comments: Pt able to initiate bringing legs off EOB but needed modA with HHA to pull her trunk up to sit L EOB. MaxA at legs and trunk to return to supine.    Transfers Overall transfer level: Needs assistance Equipment used: Rolling walker (2 wheels) Transfers: Sit to/from Stand Sit to Stand: Min assist           General transfer comment: MinA needed to power up to stand and gain balance. Pt needing cues to get feet flat as her forefoot was off the floor initially. Pt maintains a flexed posture when standing.    Ambulation/Gait Ambulation/Gait assistance: Mod assist Gait Distance (Feet): 2 Feet Assistive device: Rolling walker (2 wheels) Gait Pattern/deviations: Step-to pattern, Decreased step length - right, Decreased step length - left, Decreased stride length, Shuffle, Trunk flexed Gait velocity: reduced Gait velocity interpretation: <1.31 ft/sec, indicative of household ambulator   General Gait Details: Pt takes slow, small, unsteady, shuffling steps laterally towards HOB. Pt flexes more at her trunk the longer she stands. ModA needed for balance throughout. Pt declining ability to ambulate further at this time  Stairs  Wheelchair Mobility     Tilt Bed    Modified Rankin (Stroke Patients Only) Modified Rankin (Stroke Patients Only) Pre-Morbid Rankin Score:  Slight disability Modified Rankin: Moderately severe disability     Balance Overall balance assessment: Needs assistance, History of Falls Sitting-balance support: Single extremity supported, Bilateral upper extremity supported, Feet unsupported Sitting balance-Leahy Scale: Poor Sitting balance - Comments: UE support and minA needed to sit edge of stretcher, her feet do not reach the floor due to her stature   Standing balance support: Bilateral upper extremity supported, During functional activity, Reliant on assistive device for balance Standing balance-Leahy Scale: Poor Standing balance comment: reliant on RW and external physical assistance                             Pertinent Vitals/Pain Pain Assessment Pain Assessment: Faces Faces Pain Scale: Hurts little more Pain Location: generalized Pain Descriptors / Indicators: Discomfort, Grimacing, Guarding Pain Intervention(s): Limited activity within patient's tolerance, Monitored during session, Repositioned    Home Living Family/patient expects to be discharged to:: Private residence Living Arrangements: Alone Available Help at Discharge: Family;Neighbor;Available PRN/intermittently Type of Home: Apartment Home Access: Level entry       Home Layout: One level Home Equipment: Grab bars - toilet;Shower seat;Rollator (4 wheels)      Prior Function Prior Level of Function : Independent/Modified Independent;History of Falls (last six months)             Mobility Comments: Reports she has fallen every day this past week; mod I utilizing a rollator ADLs Comments: does not drive     Extremity/Trunk Assessment   Upper Extremity Assessment Upper Extremity Assessment: Defer to OT evaluation    Lower Extremity Assessment Lower Extremity Assessment: Generalized weakness;RLE deficits/detail;LLE deficits/detail RLE Deficits / Details: hx of peripheral neuropathy impacting sensation inferior to the knee, worse  distally than proximally; gross weakness RLE Sensation: history of peripheral neuropathy LLE Deficits / Details: hx of peripheral neuropathy impacting sensation inferior to the knee, worse distally than proximally; gross weakness LLE Sensation: history of peripheral neuropathy    Cervical / Trunk Assessment Cervical / Trunk Assessment: Kyphotic  Communication   Communication Communication: No apparent difficulties    Cognition Arousal: Alert Behavior During Therapy: Anxious   PT - Cognitive impairments: No apparent impairments                       PT - Cognition Comments: Pt reports anxiety in regards to fear of falling Following commands: Intact       Cueing Cueing Techniques: Verbal cues     General Comments General comments (skin integrity, edema, etc.): BP 141/72 (92) & 69 bpm supine, 157/80 (103) sitting EOB    Exercises     Assessment/Plan    PT Assessment Patient needs continued PT services  PT Problem List Decreased strength;Decreased activity tolerance;Decreased balance;Decreased mobility;Decreased range of motion;Impaired sensation       PT Treatment Interventions DME instruction;Gait training;Functional mobility training;Therapeutic activities;Therapeutic exercise;Balance training;Neuromuscular re-education;Patient/family education    PT Goals (Current goals can be found in the Care Plan section)  Acute Rehab PT Goals Patient Stated Goal: to improve and not fall PT Goal Formulation: With patient/family Time For Goal Achievement: 10/19/23 Potential to Achieve Goals: Good    Frequency Min 2X/week     Co-evaluation               AM-PAC PT 6 Clicks  Mobility  Outcome Measure Help needed turning from your back to your side while in a flat bed without using bedrails?: A Little Help needed moving from lying on your back to sitting on the side of a flat bed without using bedrails?: A Lot Help needed moving to and from a bed to a chair  (including a wheelchair)?: A Lot Help needed standing up from a chair using your arms (e.g., wheelchair or bedside chair)?: A Little Help needed to walk in hospital room?: Total Help needed climbing 3-5 steps with a railing? : Total 6 Click Score: 12    End of Session Equipment Utilized During Treatment: Gait belt Activity Tolerance: Patient tolerated treatment well Patient left: in bed   PT Visit Diagnosis: Unsteadiness on feet (R26.81);Other abnormalities of gait and mobility (R26.89);Muscle weakness (generalized) (M62.81);Repeated falls (R29.6);History of falling (Z91.81);Difficulty in walking, not elsewhere classified (R26.2)    Time: 9252-9188 PT Time Calculation (min) (ACUTE ONLY): 24 min   Charges:   PT Evaluation $PT Eval Moderate Complexity: 1 Mod PT Treatments $Therapeutic Activity: 8-22 mins PT General Charges $$ ACUTE PT VISIT: 1 Visit         Theo Ferretti, PT, DPT Acute Rehabilitation Services  Office: [phone]   Theo CHRISTELLA Ferretti 10/05/2023, 8:27 AM

## 2023-10-05 NOTE — ED Provider Notes (Signed)
  Physical Exam  BP (!) 138/58 (BP Location: Left Arm)   Pulse 84   Temp (!) 97.1 F (36.2 C) (Temporal)   Resp 20   SpO2 100%   Physical Exam  Procedures  Procedures  ED Course / MDM    Medical Decision Making Amount and/or Complexity of Data Reviewed Labs: ordered. Radiology: ordered. ECG/medicine tests: ordered.  Risk Prescription drug management.   Received care for patient from Dr. Chad.  Please see his note for prior history, physical and care.  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 Has been awaiting placement due to falls. Will refer to [facility] for further care.  Accepted for placement.       Dreama Longs, MD 10/05/23 [phone]

## 2023-10-05 NOTE — ED Notes (Signed)
 Patient discharged with PTAR. Patient belongings with PTAR to include clothing, purse, phone, and iPad. Pt in no distress at time of discharge.

## 2023-10-05 NOTE — [place] FL2 (Signed)
 [place]  MEDICAID FL2 LEVEL OF CARE FORM     IDENTIFICATION  Patient Name: Katie Booker Birthdate: 06-11-1945 Sex: female Admission Date (Current Location): 10/04/2023  [facility] and [place] Number:  Producer, television/film/video and Address:  [facility], [address], [place], [place] [address]      Provider Number: [id number]  Attending Physician Name and Address:  Dreama Longs, MD  Relative Name and Phone Number:       Current Level of Care: Hospital Recommended Level of Care: Skilled Nursing Facility Prior Approval Number:    Date Approved/Denied:   PASRR Number: [id number]  Discharge Plan: SNF    Current Diagnoses: Patient Active Problem List   Diagnosis Date Noted   Osteomyelitis of great toe of left foot (HCC) 05/31/2023   Diabetic foot ulcer (HCC) 05/30/2023   Complete rupture of left Achilles tendon 11/03/2021   Morbid obesity (HCC) 01/13/2018   S/P knee replacement 12/27/2017   Spondylolisthesis at L1-L2 level 03/18/2015   Spondylosis with myelopathy 09/02/2014   Cervical spondylosis with myelopathy 09/02/2014   Anxiety 12/21/2012   Chronic back pain 12/21/2012   Insomnia 12/21/2012   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, L2-5 12/15/2012   Spinal stenosis, lumbar region, with neurogenic claudication 10/26/2012   Spinal stenosis, thoracic 10/26/2012   Umbilical hernia 02/25/2012   OSA (obstructive sleep apnea) 11/12/2011   Exogenous obesity 11/12/2011   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   Hypothyroidism 09/18/2009   Diabetes mellitus without complication (HCC) 09/18/2009   Dyslipidemia 09/18/2009   Anemia 09/18/2009   Depression 09/18/2009   Essential hypertension 09/18/2009   GERD 09/18/2009   Primary localized osteoarthritis of left knee 09/18/2009    Orientation RESPIRATION BLADDER Height & Weight     Self, Time, Situation, Place  Normal Continent Weight:   Height:     BEHAVIORAL SYMPTOMS/MOOD NEUROLOGICAL  BOWEL NUTRITION STATUS      Continent Diet (Regular)  AMBULATORY STATUS COMMUNICATION OF NEEDS Skin   Extensive Assist                           Personal Care Assistance Level of Assistance  Bathing, Dressing, Feeding Bathing Assistance: Maximum assistance Feeding assistance: Limited assistance Dressing Assistance: Maximum assistance     Functional Limitations Info  Sight, Hearing, Speech Sight Info: Impaired (Glasses) Hearing Info: Adequate Speech Info: Impaired    SPECIAL CARE FACTORS FREQUENCY  PT (By licensed PT), OT (By licensed OT)     PT Frequency: 5x weekly OT Frequency: 5x weekly            Contractures Contractures Info: Not present    Additional Factors Info  Code Status, Allergies Code Status Info: Full Code Allergies Info: Duraprep (Antiseptic Products, Misc.)  Advil Allergy Sinus (Chlorpheniramine-pse-ibuprofen)  Betadine (Povidone Iodine)  Hydromorphone  Hcl  Morphine  And Codeine  Benadryl  (Diphenhydramine )           Current Medications (10/05/2023):  This is the current hospital active medication list Current Facility-Administered Medications  Medication Dose Route Frequency Provider Last Rate Last Admin   ALPRAZolam  (XANAX ) tablet 1 mg  1 mg Oral BID Rancour, Stephen, MD       atenolol  (TENORMIN ) tablet 25 mg  25 mg Oral Daily Rancour, Stephen, MD       atorvastatin  (LIPITOR) tablet 40 mg  40 mg Oral Daily Rancour, Stephen, MD       citalopram  (CELEXA ) tablet 20 mg  20 mg Oral Daily  Rancour, Garnette, MD       HYDROcodone -acetaminophen  (NORCO) 7.5-325 MG per tablet 1 tablet  1 tablet Oral Q6H PRN Rancour, Garnette, MD   1 tablet at 10/05/23 9343   insulin  aspart (novoLOG ) injection 0-6 Units  0-6 Units Subcutaneous TID WC Rancour, Garnette, MD       levothyroxine  (SYNTHROID ) tablet 125 mcg  125 mcg Oral Daily Rancour, Stephen, MD       lisinopril  (ZESTRIL ) tablet 10 mg  10 mg Oral Daily Rancour, Stephen, MD       metFORMIN  (GLUCOPHAGE ) tablet 1,000  mg  1,000 mg Oral BID WC Rancour, Stephen, MD       pantoprazole  (PROTONIX ) EC tablet 40 mg  40 mg Oral BID AC Rancour, Stephen, MD       pioglitazone  (ACTOS ) tablet 15 mg  15 mg Oral Daily Rancour, Stephen, MD       pregabalin  (LYRICA ) capsule 75 mg  75 mg Oral Daily Rancour, Stephen, MD       Current Outpatient Medications  Medication Sig Dispense Refill   acetaminophen  (ACETAMINOPHEN  8 HOUR) 650 MG CR tablet Take 1,300 mg by mouth every 8 (eight) hours as needed for pain.     ALPRAZolam  (XANAX ) 0.5 MG tablet Take 2 tablets (1 mg total) by mouth 2 (two) times daily. (Patient taking differently: Take 0.5 mg by mouth 2 (two) times daily.) 90 tablet 2   atenolol  (TENORMIN ) 25 MG tablet TAKE 1 TABLET BY MOUTH DAILY 100 tablet 2   atorvastatin  (LIPITOR) 40 MG tablet TAKE 1 TABLET BY MOUTH DAILY 100 tablet 2   Carboxymethylcellulose Sodium (LUBRICANT EYE DROPS OP) Apply 2 drops to eye 4 (four) times daily as needed (dry eyes).      citalopram  (CELEXA ) 20 MG tablet Take 20 mg by mouth daily.     estradiol  (ESTRACE ) 0.1 MG/GM vaginal cream Place 0.5 g vaginally 2 (two) times a week. Place 0.5g nightly for two weeks then twice a week after 42.5 g 11   HYDROcodone -acetaminophen  (NORCO) 7.5-325 MG tablet Take 1 tablet by mouth every 6 (six) hours as needed for moderate pain (pain score 4-6). (Patient taking differently: Take 1 tablet by mouth every 6 (six) hours.) 120 tablet 0   levothyroxine  (SYNTHROID ) 125 MCG tablet TAKE 1 TABLET BY MOUTH DAILY 100 tablet 1   lisinopril  (ZESTRIL ) 10 MG tablet Take 10 mg by mouth daily.     meloxicam  (MOBIC ) 15 MG tablet TAKE 1 TABLET BY MOUTH DAILY 100 tablet 1   metFORMIN  (GLUCOPHAGE ) 1000 MG tablet TAKE 1 TABLET BY MOUTH TWICE  DAILY WITH MEALS 200 tablet 1   Multiple Vitamin (MULTIVITAMIN) tablet Take 1 tablet by mouth daily.     pantoprazole  (PROTONIX ) 40 MG tablet TAKE 1 TABLET BY MOUTH TWICE  DAILY BEFORE MEALS (Patient taking differently: Take 40 mg by mouth  daily.) 200 tablet 1   pioglitazone  (ACTOS ) 15 MG tablet TAKE 1 TABLET BY MOUTH DAILY 100 tablet 2   pramipexole  (MIRAPEX ) 0.125 MG tablet TAKE 1 TABLET(0.125 MG) BY MOUTH AT BEDTIME 30 tablet 2   pregabalin  (LYRICA ) 75 MG capsule TAKE 1 CAPSULE(75 MG) BY MOUTH TWICE DAILY (Patient taking differently: Take 75 mg by mouth daily.) 180 capsule 0   tiZANidine  (ZANAFLEX ) 4 MG tablet Take 1 tablet (4 mg total) by mouth 3 (three) times daily as needed. (Patient taking differently: Take 4 mg by mouth in the morning and at bedtime.) 180 tablet 0   tobramycin  (TOBREX ) 0.3 % ophthalmic solution  Place 1 drop into the right eye in the morning, at noon, and at bedtime. Instill one drop in right eye three time per day for three days (the day before, day of, and day after injection).     triamcinolone  cream (KENALOG ) 0.1 % APPLY TOPICALLY TO THE AFFECTED AREA TWICE DAILY 80 g 0   vortioxetine  HBr (TRINTELLIX ) 5 MG TABS tablet Take 5 mg by mouth daily.       Discharge Medications: Please see discharge summary for a list of discharge medications.  Relevant Imaging Results:  Relevant Lab Results:   Additional Information SSN: [id number]  Niels LITTIE Portugal, LCSW

## 2023-10-05 NOTE — ED Notes (Addendum)
 Pt resting bed, no distress noted.

## 2023-10-05 NOTE — ED Notes (Signed)
 Patient was talking with daughter on personal phone and requested to speak to primary RN. I informed her I was the tech helping with her mother's care. I tried finding primary RN but she was busy with another patient. Told daughter I would get the nurse to call her as soon as she is done with an update on PTAR arrival. Daughter understood. Information relayed to primary RN.

## 2023-10-05 NOTE — ED Notes (Signed)
 NT assisted pt to restroom pt did okay with standing while holding to the bar. Pt urinated putting out urine sample was taken.

## 2023-10-05 NOTE — ED Notes (Signed)
 Breakfast tray ordered for the patient.  Per [facility], it should arrive within the hour.

## 2023-10-05 NOTE — Discharge Instructions (Addendum)
 You are being discharged to a skilled nursing facility [facility] in [place]) for short term rehabilitation.

## 2023-10-05 NOTE — ED Notes (Signed)
 NT assisted pt to restroom and setting up breakfast tray to eat.

## 2023-10-05 NOTE — ED Notes (Addendum)
 Patient and patient daughter contacted/notified that estimated time for PTAR to facility will be 1-1.5hrs. Patient also educated on hospital policy on home medications and notified that medications are to be ordered by ER doc for safety. Pt verbalizes understanding. Pt comfortable and resting in stretcher with call light in reach.

## 2023-10-05 NOTE — Progress Notes (Addendum)
 1:27pm: Patient's insurance authorization has been approved #[id number], next review date is 10/07/23.  CSW spoke with patient at bedside to inform her of information. Patient is agreeable to go to [facility] as her daughter resides in [place].  CSW spoke with Josette at [facility] to provide her with authorization information. Josette states patient can be accepted at the facility today. Josette confirms she has spoken with patient's daughter Nat to have admission paperwork completed.   Patient will go to [address] ([address], [place], [place] [address]) via [facility] - RN to call when ready. The number to call for report is [phone].  CSW informed Rebecka Radar of [facility] DSS of discharge plan.  1:15pm: CSW spoke with patient's daughter Nat to present her with bed offers. Nat has agreed to accept bed offer from [facility].  CSW will initiate insurance authorization.  11am: CSW spoke star Starr at [facility] who states the facility is unable to offer patient a bed at this time.  CSW spoke with Kristin at [facility] to request she review patient for a possible bed offer.  CSW spoke with Dru at [facility] who states the patient's insurance is not in network with the facility.  CSW spoke with Randine at [facility] who is agreeable to review clinicals for a possible bed offer.  9:40am: CSW spoke with patient's daughter Nat to discuss discharge plan. Nat agreeable for patient to return to [facility] as that was her preference. Nat agreeable for CSW to initiate insurance authorization is a bed offer can be obtained.  9am: CSW received call from Rebecka Radar at [facility] DSS ([facility]) who states she is involved with patient due to frequent calls to 911. Arneshia states patient has called 911 five times in the last week. Rebecka states patient is having frequent falls at home and is unsafe to return in her current state and needs  rehabilitation. Rebecka states she is working on a Physicist, medical. Rebecka states she has been in contact with patient's daughter Nat and son that resides in [place] .  CSW spoke with patient at bedside to discuss PT recommendations for SNF. Patient confirms she has been in contact with DSS staff regarding her frequent calls to 911 and frequent falls. Patient states agreement to going to a facility for STR. Patient states her daughter is a Producer, television/film/video and provided permission for CSW to contact her. Patient states she has been to [facility] previously and is agreeable to return there if a bed offer can be obtained.  CSW sent clinicals via HUB for review.  CSW sent message to admissions at [facility] requesting patient be reviewed for a possible bed offer.  8:35am: CSW spoke with Anessa, PT who states the recommendation is for SNF.  7:45am: CSW received consult for patient for possible placement.  Therapy team has signed in - CSW will wait for therapy recommendations prior to proceeding with discharge planning.  Niels Portugal, MSW, LCSW Transitions of Care  Clinical Social Worker II [phone]

## 2023-10-05 NOTE — ED Notes (Signed)
 Pt transported to MRI

## 2023-10-05 NOTE — Group Note (Deleted)
 Date:  10/05/2023 Time:  2:22 PM  Group Topic/Focus:  Wellness Toolbox:   The focus of this group is to discuss various aspects of wellness, balancing those aspects and exploring ways to increase the ability to experience wellness.  Patients will create a wellness toolbox for use upon discharge.     Participation Level:  {BHH PARTICIPATION OZCZO:77735}  Participation Quality:  {BHH PARTICIPATION QUALITY:22265}  Affect:  {BHH AFFECT:22266}  Cognitive:  {BHH COGNITIVE:22267}  Insight: {BHH Insight2:20797}  Engagement in Group:  {BHH ENGAGEMENT IN HMNLE:77731}  Modes of Intervention:  {BHH MODES OF INTERVENTION:22269}  Additional Comments:  ***  Katie Booker 10/05/2023, 2:22 PM

## 2023-10-05 NOTE — Evaluation (Signed)
 Occupational Therapy Evaluation Patient Details Name: Katie Booker MRN: [id number] DOB: 08/27/1945 Today's Date: 10/05/2023   History of Present Illness   Pt is a 78 y.o. female who presented 10/03/23 with recurrent falls and generalized weakness. MRI brain and lumbar spine negative for acute changes. PMH includes: type 2 diabetes, peripheral neuropathy, hyperlipidemia, hypertension, restless leg syndrome, iron deficiency anemia, hypothyroidism, obesity, OSA     Clinical Impressions PTA, pt living alone and independent with ADL, however, endorses multiple recent falls in the home setting. Pt reports interest in transition to ALF after rehabilitation <3 hours/day recommended by this OT. Pt currently presents with generalized weakness, decreased balance, safety, and activity tolerance. Pt currently requires up to mod A for BADL and min-mod A for functional mobility tasks. Will continue to follow acutely.      If plan is discharge home, recommend the following:   Assistance with cooking/housework;Assist for transportation;Help with stairs or ramp for entrance;A lot of help with walking and/or transfers;Two people to help with walking and/or transfers;A lot of help with bathing/dressing/bathroom     Functional Status Assessment   Patient has had a recent decline in their functional status and demonstrates the ability to make significant improvements in function in a reasonable and predictable amount of time.     Equipment Recommendations   Other (comment) (defer)     Recommendations for Other Services         Precautions/Restrictions   Precautions Precautions: Fall Recall of Precautions/Restrictions: Intact Precaution/Restrictions Comments: falls frequently Restrictions Weight Bearing Restrictions Per Provider Order: No     Mobility Bed Mobility Overal bed mobility: Needs Assistance Bed Mobility: Supine to Sit, Sit to Supine     Supine to sit: Mod assist, HOB  elevated Sit to supine: Min assist   General bed mobility comments: Pt able to initiate bringing legs off EOB but needed modA with HHA to pull her trunk up to sit L EOB. min A with incr time and effort to bring BLE back into bed    Transfers Overall transfer level: Needs assistance Equipment used: Rolling walker (2 wheels) Transfers: Sit to/from Stand Sit to Stand: Min assist           General transfer comment: MinA needed to power up to stand and gain balance. Pt needing cues to get feet flat as her forefoot was off the floor initially. Pt maintains a flexed posture when standing.      Balance Overall balance assessment: Needs assistance, History of Falls Sitting-balance support: Single extremity supported, Bilateral upper extremity supported, Feet unsupported Sitting balance-Leahy Scale: Poor Sitting balance - Comments: UE support and minA needed to sit edge of stretcher, her feet do not reach the floor due to her stature   Standing balance support: Bilateral upper extremity supported, During functional activity, Reliant on assistive device for balance Standing balance-Leahy Scale: Poor Standing balance comment: reliant on RW and external physical assistance                           ADL either performed or assessed with clinical judgement   ADL Overall ADL's : Needs assistance/impaired Eating/Feeding: Set up;Sitting   Grooming: Set up;Sitting   Upper Body Bathing: Set up;Sitting   Lower Body Bathing: Minimal assistance;Sitting/lateral leans   Upper Body Dressing : Set up;Sitting   Lower Body Dressing: Moderate assistance;Sitting/lateral leans   Toilet Transfer: Minimal assistance Toilet Transfer Details (indicate cue type and reason): STS up from elevated  stretcher bed                 Vision Baseline Vision/History: 1 Wears glasses Ability to See in Adequate Light: 0 Adequate Patient Visual Report: No change from baseline Vision Assessment?: No  apparent visual deficits     Perception Perception: Not tested       Praxis Praxis: Not tested       Pertinent Vitals/Pain Pain Assessment Pain Assessment: Faces Faces Pain Scale: Hurts little more Pain Location: generalized Pain Descriptors / Indicators: Discomfort, Grimacing, Guarding Pain Intervention(s): Limited activity within patient's tolerance, Monitored during session     Extremity/Trunk Assessment Upper Extremity Assessment Upper Extremity Assessment: Generalized weakness   Lower Extremity Assessment Lower Extremity Assessment: Defer to PT evaluation RLE Deficits / Details: hx of peripheral neuropathy impacting sensation inferior to the knee, worse distally than proximally; gross weakness RLE Sensation: history of peripheral neuropathy LLE Deficits / Details: hx of peripheral neuropathy impacting sensation inferior to the knee, worse distally than proximally; gross weakness LLE Sensation: history of peripheral neuropathy   Cervical / Trunk Assessment Cervical / Trunk Assessment: Kyphotic   Communication Communication Communication: No apparent difficulties   Cognition Arousal: Alert Behavior During Therapy: Anxious Cognition: Cognition impaired       Memory impairment (select all impairments): Short-term memory (unsure if baseline)     OT - Cognition Comments: pleasant and conversational throughout. anxiety with fear of falling                 Following commands: Intact       Cueing  General Comments   Cueing Techniques: Verbal cues  HR 70s-110 with multiple monitor alarms for [facility]   Exercises     Shoulder Instructions      Home Living Family/patient expects to be discharged to:: Private residence Living Arrangements: Alone Available Help at Discharge: Family;Neighbor;Available PRN/intermittently Type of Home: Apartment Home Access: Level entry     Home Layout: One level     Bathroom Shower/Tub: Higher education careers adviser: Handicapped height     Home Equipment: Grab bars - toilet;Shower seat;Rollator (4 wheels)          Prior Functioning/Environment Prior Level of Function : Independent/Modified Independent;History of Falls (last six months)             Mobility Comments: Reports she has fallen every day this past week; mod I utilizing a rollator ADLs Comments: does not drive    OT Problem List: Decreased strength;Decreased activity tolerance;Impaired balance (sitting and/or standing);Decreased safety awareness;Decreased knowledge of use of DME or AE;Pain   OT Treatment/Interventions: Self-care/ADL training;Therapeutic exercise;DME and/or AE instruction;Therapeutic activities;Patient/family education;Balance training      OT Goals(Current goals can be found in the care plan section)   Acute Rehab OT Goals Patient Stated Goal: get better OT Goal Formulation: With patient Time For Goal Achievement: 10/19/23 Potential to Achieve Goals: Good   OT Frequency:  Min 2X/week    Co-evaluation              AM-PAC OT 6 Clicks Daily Activity     Outcome Measure Help from another person eating meals?: None Help from another person taking care of personal grooming?: A Little Help from another person toileting, which includes using toliet, bedpan, or urinal?: A Lot Help from another person bathing (including washing, rinsing, drying)?: A Lot Help from another person to put on and taking off regular upper body clothing?: A Little Help from another person to  put on and taking off regular lower body clothing?: A Lot 6 Click Score: 16   End of Session Nurse Communication: Mobility status;Other (comment) (pt has not yet had breakfast)  Activity Tolerance: Patient tolerated treatment well Patient left: in bed (hallway bed)  OT Visit Diagnosis: Unsteadiness on feet (R26.81);Muscle weakness (generalized) (M62.81);Pain Pain - part of body:  (back)                Time: 9163-9145 OT Time  Calculation (min): 18 min Charges:  OT General Charges $OT Visit: 1 Visit OT Evaluation $OT Eval Low Complexity: 1 Low  Elma JONETTA Lebron FREDERICK, OTR/L [facility] Acute Rehabilitation Office: [phone]   Elma JONETTA Lebron 10/05/2023, 9:04 AM

## 2023-10-07 DIAGNOSIS — M4800 Spinal stenosis, site unspecified: Secondary | ICD-10-CM | POA: Diagnosis not present

## 2023-10-07 DIAGNOSIS — E119 Type 2 diabetes mellitus without complications: Secondary | ICD-10-CM | POA: Diagnosis not present

## 2023-10-07 DIAGNOSIS — G629 Polyneuropathy, unspecified: Secondary | ICD-10-CM | POA: Diagnosis not present

## 2023-10-07 DIAGNOSIS — D508 Other iron deficiency anemias: Secondary | ICD-10-CM | POA: Diagnosis not present

## 2023-10-07 DIAGNOSIS — R5381 Other malaise: Secondary | ICD-10-CM | POA: Diagnosis not present

## 2023-10-07 DIAGNOSIS — R296 Repeated falls: Secondary | ICD-10-CM | POA: Diagnosis not present

## 2023-10-07 DIAGNOSIS — E785 Hyperlipidemia, unspecified: Secondary | ICD-10-CM | POA: Diagnosis not present

## 2023-10-07 DIAGNOSIS — K219 Gastro-esophageal reflux disease without esophagitis: Secondary | ICD-10-CM | POA: Diagnosis not present

## 2023-10-07 DIAGNOSIS — M1990 Unspecified osteoarthritis, unspecified site: Secondary | ICD-10-CM | POA: Diagnosis not present

## 2023-10-07 DIAGNOSIS — I10 Essential (primary) hypertension: Secondary | ICD-10-CM | POA: Diagnosis not present

## 2023-10-07 DIAGNOSIS — G4733 Obstructive sleep apnea (adult) (pediatric): Secondary | ICD-10-CM | POA: Diagnosis not present

## 2023-10-08 LAB — URINE CULTURE: Culture: 30000 — AB

## 2023-10-09 ENCOUNTER — Telehealth (HOSPITAL_BASED_OUTPATIENT_CLINIC_OR_DEPARTMENT_OTHER): Payer: Self-pay | Admitting: *Deleted

## 2023-10-09 NOTE — Progress Notes (Signed)
 ED Antimicrobial Stewardship Positive Culture Follow Up   Katie Booker is an 78 y.o. female who presented to [facility] on @ADMITDT @ with a chief complaint of  Chief Complaint  Patient presents with   Fall    Recent Results (from the past 720 hours)  Urine Culture     Status: Abnormal   Collection Time: 10/05/23  9:53 AM   Specimen: Urine, Clean Catch  Result Value Ref Range Status   Specimen Description URINE, CLEAN CATCH  Final   Special Requests NONE  Final   Culture (A)  Final    30,000 COLONIES/mL PSEUDOMONAS AERUGINOSA Two isolates with different morphologies were identified as the same organism.The most resistant organism was reported. Performed at [facility], [address] [place], [place] [address]    Report Status 10/08/2023 FINAL  Final   Organism ID, Bacteria PSEUDOMONAS AERUGINOSA (A)  Final      Susceptibility   Pseudomonas aeruginosa - MIC*    MEROPENEM 1 SENSITIVE Sensitive     CIPROFLOXACIN  0.5 SENSITIVE Sensitive     IMIPENEM 2 SENSITIVE Sensitive     PIP/TAZO Value in next row Sensitive ug/mL     16 SENSITIVEThis is a modified FDA-approved test that has been validated and its performance characteristics determined by the reporting laboratory.  This laboratory is certified under the Clinical Laboratory Improvement Amendments CLIA as qualified to perform high complexity clinical laboratory testing.    CEFEPIME Value in next row Sensitive      16 SENSITIVEThis is a modified FDA-approved test that has been validated and its performance characteristics determined by the reporting laboratory.  This laboratory is certified under the Clinical Laboratory Improvement Amendments CLIA as qualified to perform high complexity clinical laboratory testing.    CEFTAZIDIME/AVIBACTAM Value in next row Sensitive ug/mL     16 SENSITIVEThis is a modified FDA-approved test that has been validated and its performance characteristics determined by the reporting laboratory.   This laboratory is certified under the Clinical Laboratory Improvement Amendments CLIA as qualified to perform high complexity clinical laboratory testing.    CEFTOLOZANE/TAZOBACTAM Value in next row Sensitive ug/mL     16 SENSITIVEThis is a modified FDA-approved test that has been validated and its performance characteristics determined by the reporting laboratory.  This laboratory is certified under the Clinical Laboratory Improvement Amendments CLIA as qualified to perform high complexity clinical laboratory testing.    TOBRAMYCIN  Value in next row Sensitive      16 SENSITIVEThis is a modified FDA-approved test that has been validated and its performance characteristics determined by the reporting laboratory.  This laboratory is certified under the Clinical Laboratory Improvement Amendments CLIA as qualified to perform high complexity clinical laboratory testing.    CEFTAZIDIME Value in next row Sensitive      16 SENSITIVEThis is a modified FDA-approved test that has been validated and its performance characteristics determined by the reporting laboratory.  This laboratory is certified under the Clinical Laboratory Improvement Amendments CLIA as qualified to perform high complexity clinical laboratory testing.    * 30,000 COLONIES/mL PSEUDOMONAS AERUGINOSA     [x]  Patient discharged originally without antimicrobial agent and treatment is now indicated  New antibiotic prescription: Ciprofloxacin  250mg  q12h x 3 days (Qty 6; Refills 0)  ED Provider: Garrick Dorn Buttner, PharmD, BCPS 10/09/2023 10:35 AM ED Clinical Pharmacist -  [phone]

## 2023-10-09 NOTE — Telephone Encounter (Signed)
 Post ED Visit - Positive Culture Follow-up: Successful Patient Follow-Up  Culture assessed and recommendations reviewed by:  []  Rankin Dee, Pharm.D. []  Venetia Gully, Pharm.D., BCPS AQ-ID []  Garrel Crews, Pharm.D., BCPS []  Almarie Lunger, [address].D., BCPS []  [place], [address].D., BCPS, AAHIVP []  Rosaline Bihari, Pharm.D., BCPS, AAHIVP []  Vernell Meier, PharmD, BCPS []  Latanya Hint, PharmD, BCPS []  Donald Medley, PharmD, BCPS [x] Dorn Buttner, PharmD  Positive urine culture  [x]  Patient discharged without antimicrobial prescription and treatment is now indicated []  Organism is resistant to prescribed ED discharge antimicrobial []  Patient with positive blood cultures  Changes discussed with ED provider: Lamar Salen New antibiotic prescription Cipro  250mg  q12h x 3 days (qty 6)  Called [facility] and faxed report to facility  Contacted facility date 10/09/23, time 1348   Albino Alan Novak 10/09/2023, 1:47 PM

## 2023-10-10 ENCOUNTER — Telehealth: Payer: Self-pay | Admitting: *Deleted

## 2023-10-10 DIAGNOSIS — N390 Urinary tract infection, site not specified: Secondary | ICD-10-CM | POA: Diagnosis not present

## 2023-10-10 DIAGNOSIS — Z79899 Other long term (current) drug therapy: Secondary | ICD-10-CM | POA: Diagnosis not present

## 2023-10-10 NOTE — Progress Notes (Signed)
 Complex Care Management Care Guide Note  10/10/2023 Name: Katie Booker MRN: [id number] DOB: 1945-06-17  Katie Booker is a 78 y.o. year old female who is a primary care patient of Theophilus Andrews, Tully GRADE, MD and is actively engaged with the care management team. I reached out to Reena Jenkins Bihari by phone today to assist with re-scheduling  with the RN Case Manager.  Follow up plan: Unsuccessful telephone outreach attempt made. A HIPAA compliant phone message was left for the patient providing contact information and requesting a return call.  Thedford Franks, CMA [place]  [facility], [facility] Guide Direct Dial: [phone]  Fax: [phone] Website: [email or web address]

## 2023-10-10 NOTE — Progress Notes (Signed)
 Complex Care Management Care Guide Note  10/10/2023 Name: Katie Booker MRN: [id number] DOB: 1945/05/31  Katie Booker is a 78 y.o. year old female who is a primary care patient of Theophilus Andrews, Tully GRADE, MD and is actively engaged with the care management team. I reached out to Reena Jenkins Bihari by phone today to assist with re-scheduling  with the Licensed Clinical Social Worker.  Follow up plan: pt is SNF - doesn't know for how long - will contact us  back if services still needed after d/c   Thedford Franks, CMA [facility]  [facility], [facility] Guide Direct Dial: [phone]  Fax: [phone] Website: [email or web address]

## 2023-10-13 DIAGNOSIS — I10 Essential (primary) hypertension: Secondary | ICD-10-CM | POA: Diagnosis not present

## 2023-10-13 DIAGNOSIS — N390 Urinary tract infection, site not specified: Secondary | ICD-10-CM | POA: Diagnosis not present

## 2023-10-13 DIAGNOSIS — K219 Gastro-esophageal reflux disease without esophagitis: Secondary | ICD-10-CM | POA: Diagnosis not present

## 2023-10-13 DIAGNOSIS — Z79899 Other long term (current) drug therapy: Secondary | ICD-10-CM | POA: Diagnosis not present

## 2023-10-14 DIAGNOSIS — E119 Type 2 diabetes mellitus without complications: Secondary | ICD-10-CM | POA: Diagnosis not present

## 2023-10-14 DIAGNOSIS — R5381 Other malaise: Secondary | ICD-10-CM | POA: Diagnosis not present

## 2023-10-14 DIAGNOSIS — I10 Essential (primary) hypertension: Secondary | ICD-10-CM | POA: Diagnosis not present

## 2023-10-16 ENCOUNTER — Other Ambulatory Visit: Payer: Self-pay | Admitting: Internal Medicine

## 2023-10-18 ENCOUNTER — Other Ambulatory Visit: Payer: Self-pay | Admitting: Internal Medicine

## 2023-10-21 DIAGNOSIS — K5900 Constipation, unspecified: Secondary | ICD-10-CM | POA: Diagnosis not present

## 2023-10-21 DIAGNOSIS — E119 Type 2 diabetes mellitus without complications: Secondary | ICD-10-CM | POA: Diagnosis not present

## 2023-10-21 DIAGNOSIS — I10 Essential (primary) hypertension: Secondary | ICD-10-CM | POA: Diagnosis not present

## 2023-10-28 DIAGNOSIS — G4700 Insomnia, unspecified: Secondary | ICD-10-CM | POA: Diagnosis not present

## 2023-10-28 DIAGNOSIS — R5381 Other malaise: Secondary | ICD-10-CM | POA: Diagnosis not present

## 2023-10-28 DIAGNOSIS — R300 Dysuria: Secondary | ICD-10-CM | POA: Diagnosis not present

## 2023-10-28 DIAGNOSIS — R195 Other fecal abnormalities: Secondary | ICD-10-CM | POA: Diagnosis not present

## 2023-10-29 DIAGNOSIS — N390 Urinary tract infection, site not specified: Secondary | ICD-10-CM | POA: Diagnosis not present

## 2023-10-30 DIAGNOSIS — R2681 Unsteadiness on feet: Secondary | ICD-10-CM | POA: Diagnosis not present

## 2023-11-01 DIAGNOSIS — R2681 Unsteadiness on feet: Secondary | ICD-10-CM | POA: Diagnosis not present

## 2023-11-02 DIAGNOSIS — H353211 Exudative age-related macular degeneration, right eye, with active choroidal neovascularization: Secondary | ICD-10-CM | POA: Diagnosis not present

## 2023-11-02 DIAGNOSIS — H353123 Nonexudative age-related macular degeneration, left eye, advanced atrophic without subfoveal involvement: Secondary | ICD-10-CM | POA: Diagnosis not present

## 2023-11-02 DIAGNOSIS — E119 Type 2 diabetes mellitus without complications: Secondary | ICD-10-CM | POA: Diagnosis not present

## 2023-11-02 DIAGNOSIS — H35372 Puckering of macula, left eye: Secondary | ICD-10-CM | POA: Diagnosis not present

## 2023-11-02 DIAGNOSIS — H35433 Paving stone degeneration of retina, bilateral: Secondary | ICD-10-CM | POA: Diagnosis not present

## 2023-11-02 DIAGNOSIS — H43813 Vitreous degeneration, bilateral: Secondary | ICD-10-CM | POA: Diagnosis not present

## 2023-11-03 DIAGNOSIS — R2681 Unsteadiness on feet: Secondary | ICD-10-CM | POA: Diagnosis not present

## 2023-11-06 DIAGNOSIS — R2681 Unsteadiness on feet: Secondary | ICD-10-CM | POA: Diagnosis not present

## 2023-11-07 DIAGNOSIS — R300 Dysuria: Secondary | ICD-10-CM | POA: Diagnosis not present

## 2023-11-07 DIAGNOSIS — G4700 Insomnia, unspecified: Secondary | ICD-10-CM | POA: Diagnosis not present

## 2023-11-08 DIAGNOSIS — N390 Urinary tract infection, site not specified: Secondary | ICD-10-CM | POA: Diagnosis not present

## 2023-11-08 DIAGNOSIS — R2681 Unsteadiness on feet: Secondary | ICD-10-CM | POA: Diagnosis not present

## 2023-11-08 DIAGNOSIS — R300 Dysuria: Secondary | ICD-10-CM | POA: Diagnosis not present

## 2023-11-10 DIAGNOSIS — E119 Type 2 diabetes mellitus without complications: Secondary | ICD-10-CM | POA: Diagnosis not present

## 2023-11-10 DIAGNOSIS — I10 Essential (primary) hypertension: Secondary | ICD-10-CM | POA: Diagnosis not present

## 2023-11-10 DIAGNOSIS — R2681 Unsteadiness on feet: Secondary | ICD-10-CM | POA: Diagnosis not present

## 2023-11-10 DIAGNOSIS — K219 Gastro-esophageal reflux disease without esophagitis: Secondary | ICD-10-CM | POA: Diagnosis not present

## 2023-11-10 DIAGNOSIS — G4700 Insomnia, unspecified: Secondary | ICD-10-CM | POA: Diagnosis not present

## 2023-11-13 DIAGNOSIS — R2681 Unsteadiness on feet: Secondary | ICD-10-CM | POA: Diagnosis not present

## 2023-11-16 DIAGNOSIS — I10 Essential (primary) hypertension: Secondary | ICD-10-CM | POA: Diagnosis not present

## 2023-11-16 DIAGNOSIS — R2681 Unsteadiness on feet: Secondary | ICD-10-CM | POA: Diagnosis not present

## 2023-11-16 DIAGNOSIS — E119 Type 2 diabetes mellitus without complications: Secondary | ICD-10-CM | POA: Diagnosis not present

## 2023-11-17 DIAGNOSIS — R2681 Unsteadiness on feet: Secondary | ICD-10-CM | POA: Diagnosis not present

## 2023-11-18 DIAGNOSIS — Z79899 Other long term (current) drug therapy: Secondary | ICD-10-CM | POA: Diagnosis not present

## 2023-11-18 DIAGNOSIS — I10 Essential (primary) hypertension: Secondary | ICD-10-CM | POA: Diagnosis not present

## 2023-11-18 DIAGNOSIS — E038 Other specified hypothyroidism: Secondary | ICD-10-CM | POA: Diagnosis not present

## 2023-11-19 DIAGNOSIS — R2681 Unsteadiness on feet: Secondary | ICD-10-CM | POA: Diagnosis not present

## 2023-11-20 DIAGNOSIS — R2681 Unsteadiness on feet: Secondary | ICD-10-CM | POA: Diagnosis not present

## 2023-11-21 DIAGNOSIS — R2681 Unsteadiness on feet: Secondary | ICD-10-CM | POA: Diagnosis not present

## 2023-11-22 DIAGNOSIS — R2681 Unsteadiness on feet: Secondary | ICD-10-CM | POA: Diagnosis not present

## 2023-12-08 ENCOUNTER — Other Ambulatory Visit: Payer: Self-pay | Admitting: Internal Medicine

## 2023-12-08 DIAGNOSIS — G2581 Restless legs syndrome: Secondary | ICD-10-CM

## 2024-01-25 ENCOUNTER — Other Ambulatory Visit: Payer: Self-pay | Admitting: Internal Medicine

## 2024-01-25 DIAGNOSIS — E039 Hypothyroidism, unspecified: Secondary | ICD-10-CM

## 2024-03-05 ENCOUNTER — Encounter: Payer: Self-pay | Admitting: *Deleted

## 2024-03-26 ENCOUNTER — Other Ambulatory Visit: Payer: Self-pay | Admitting: Internal Medicine
